# Patient Record
Sex: Female | Born: 1945 | Race: White | Hispanic: No | Marital: Married | Smoking: Former smoker
Health system: Southern US, Community
[De-identification: ages and names within clinical notes are randomized; demographics above are authoritative.]

## PROBLEM LIST (undated history)

## (undated) DIAGNOSIS — E7800 Pure hypercholesterolemia, unspecified: Secondary | ICD-10-CM

## (undated) DIAGNOSIS — K429 Umbilical hernia without obstruction or gangrene: Secondary | ICD-10-CM

## (undated) DIAGNOSIS — Z8679 Personal history of other diseases of the circulatory system: Secondary | ICD-10-CM

## (undated) DIAGNOSIS — E119 Type 2 diabetes mellitus without complications: Secondary | ICD-10-CM

## (undated) DIAGNOSIS — Z9889 Other specified postprocedural states: Secondary | ICD-10-CM

## (undated) DIAGNOSIS — N823 Fistula of vagina to large intestine: Secondary | ICD-10-CM

## (undated) DIAGNOSIS — T4145XA Adverse effect of unspecified anesthetic, initial encounter: Secondary | ICD-10-CM

## (undated) DIAGNOSIS — M1990 Unspecified osteoarthritis, unspecified site: Secondary | ICD-10-CM

## (undated) DIAGNOSIS — L0390 Cellulitis, unspecified: Secondary | ICD-10-CM

## (undated) DIAGNOSIS — I10 Essential (primary) hypertension: Secondary | ICD-10-CM

## (undated) DIAGNOSIS — T8859XA Other complications of anesthesia, initial encounter: Secondary | ICD-10-CM

## (undated) DIAGNOSIS — Z8742 Personal history of other diseases of the female genital tract: Secondary | ICD-10-CM

## (undated) DIAGNOSIS — R011 Cardiac murmur, unspecified: Secondary | ICD-10-CM

## (undated) DIAGNOSIS — Z531 Procedure and treatment not carried out because of patient's decision for reasons of belief and group pressure: Secondary | ICD-10-CM

## (undated) DIAGNOSIS — R112 Nausea with vomiting, unspecified: Secondary | ICD-10-CM

## (undated) DIAGNOSIS — G4730 Sleep apnea, unspecified: Secondary | ICD-10-CM

## (undated) DIAGNOSIS — F419 Anxiety disorder, unspecified: Secondary | ICD-10-CM

## (undated) DIAGNOSIS — IMO0001 Reserved for inherently not codable concepts without codable children: Secondary | ICD-10-CM

## (undated) DIAGNOSIS — K219 Gastro-esophageal reflux disease without esophagitis: Secondary | ICD-10-CM

## (undated) DIAGNOSIS — I499 Cardiac arrhythmia, unspecified: Secondary | ICD-10-CM

## (undated) DIAGNOSIS — R7303 Prediabetes: Secondary | ICD-10-CM

## (undated) HISTORY — PX: JOINT REPLACEMENT: SHX530

## (undated) HISTORY — PX: APPENDECTOMY: SHX54

---

## 1971-11-09 HISTORY — PX: EXPLORATORY LAPAROTOMY: SUR591

## 1976-11-08 HISTORY — PX: MANDIBLE SURGERY: SHX707

## 1978-11-08 HISTORY — PX: OTHER SURGICAL HISTORY: SHX169

## 1999-02-16 ENCOUNTER — Other Ambulatory Visit: Admission: RE | Admit: 1999-02-16 | Discharge: 1999-02-16 | Payer: Self-pay | Admitting: Obstetrics and Gynecology

## 2000-03-29 ENCOUNTER — Other Ambulatory Visit: Admission: RE | Admit: 2000-03-29 | Discharge: 2000-03-29 | Payer: Self-pay | Admitting: *Deleted

## 2001-05-16 ENCOUNTER — Other Ambulatory Visit: Admission: RE | Admit: 2001-05-16 | Discharge: 2001-05-16 | Payer: Self-pay | Admitting: *Deleted

## 2001-05-18 ENCOUNTER — Encounter: Admission: RE | Admit: 2001-05-18 | Discharge: 2001-05-18 | Payer: Self-pay | Admitting: *Deleted

## 2001-05-18 ENCOUNTER — Encounter: Payer: Self-pay | Admitting: *Deleted

## 2001-05-23 ENCOUNTER — Encounter: Payer: Self-pay | Admitting: *Deleted

## 2001-05-23 ENCOUNTER — Encounter: Admission: RE | Admit: 2001-05-23 | Discharge: 2001-05-23 | Payer: Self-pay | Admitting: *Deleted

## 2002-07-11 ENCOUNTER — Encounter: Admission: RE | Admit: 2002-07-11 | Discharge: 2002-07-11 | Payer: Self-pay | Admitting: *Deleted

## 2002-07-11 ENCOUNTER — Encounter: Payer: Self-pay | Admitting: *Deleted

## 2002-07-12 ENCOUNTER — Other Ambulatory Visit: Admission: RE | Admit: 2002-07-12 | Discharge: 2002-07-12 | Payer: Self-pay | Admitting: Obstetrics and Gynecology

## 2005-11-08 DIAGNOSIS — Z8679 Personal history of other diseases of the circulatory system: Secondary | ICD-10-CM

## 2005-11-08 HISTORY — DX: Personal history of other diseases of the circulatory system: Z86.79

## 2006-03-21 ENCOUNTER — Encounter: Admission: RE | Admit: 2006-03-21 | Discharge: 2006-03-21 | Payer: Self-pay | Admitting: Family Medicine

## 2006-03-21 IMAGING — CR DG LUMBAR SPINE 2-3V
3 series · 3 of 3 positions shown · non-contrast
Comparison: none

CLINICAL DATA: Left sciatica.  
 LUMBAR SPINE ? 2 VIEW:

[view not recorded (1 of 3)]
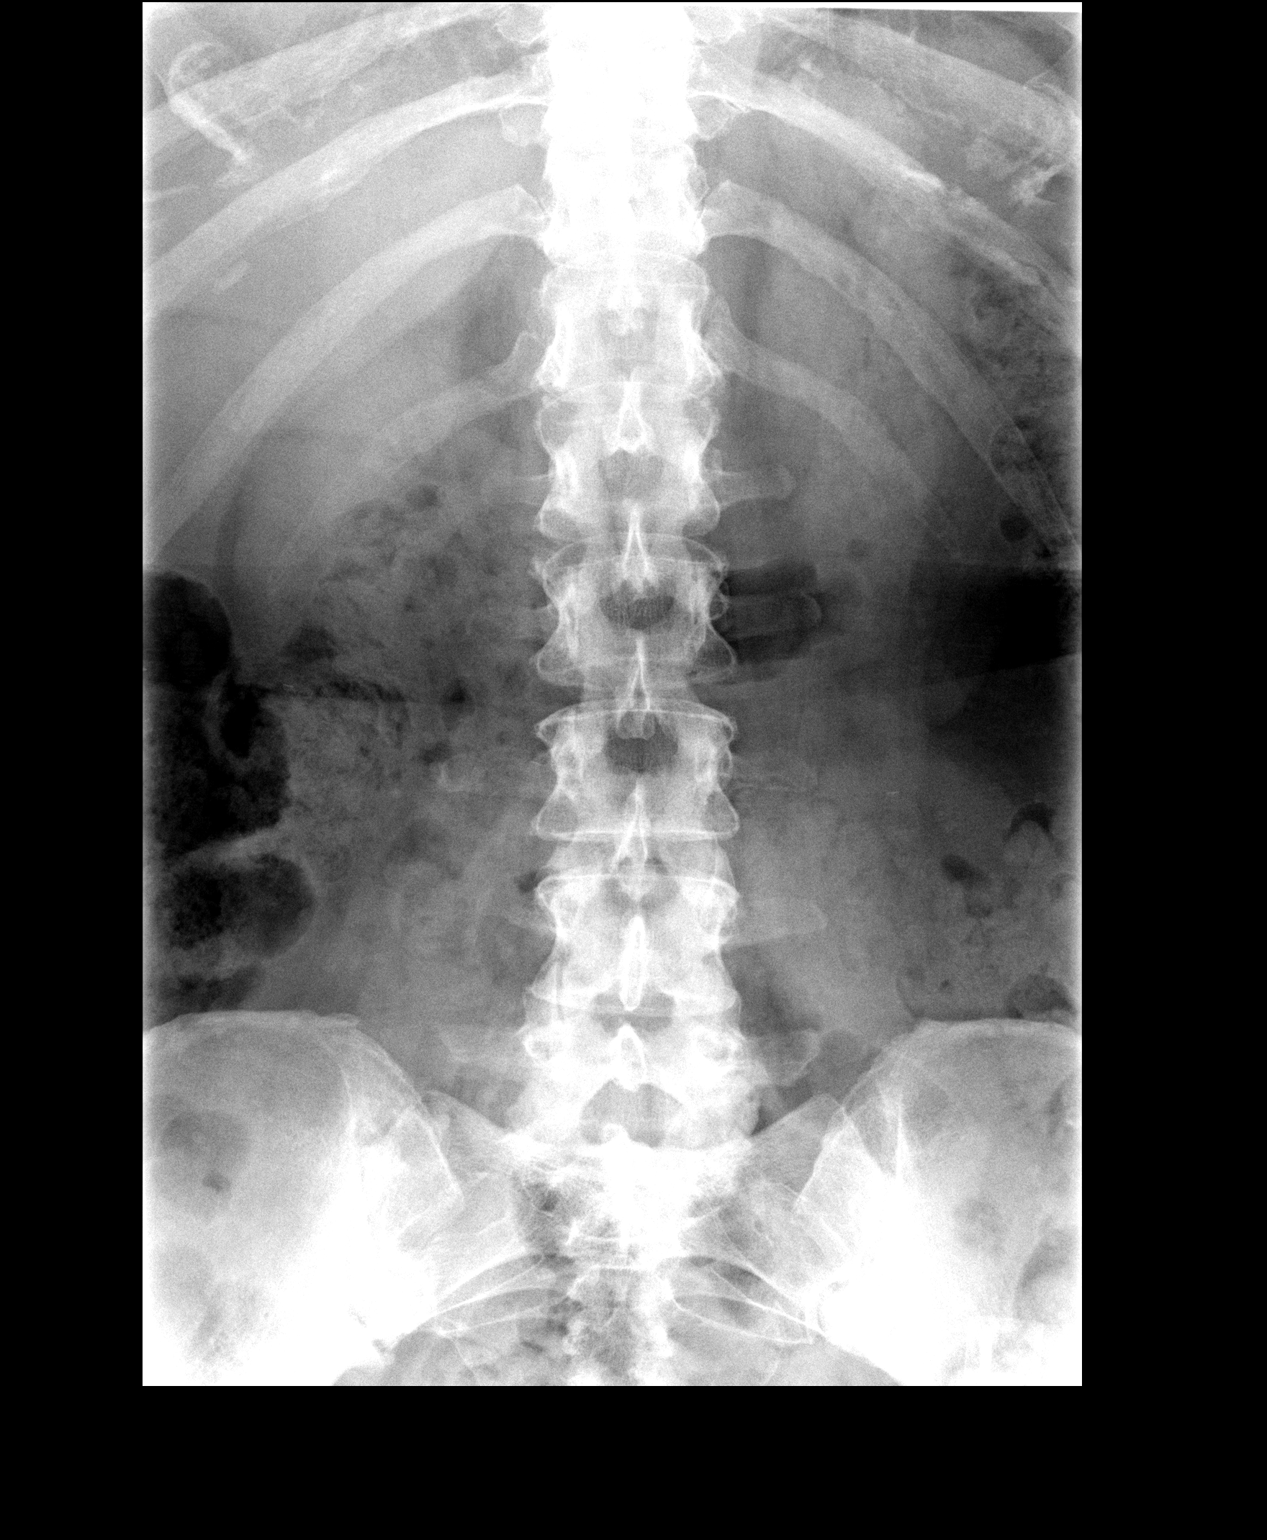

[view not recorded (2 of 3)]
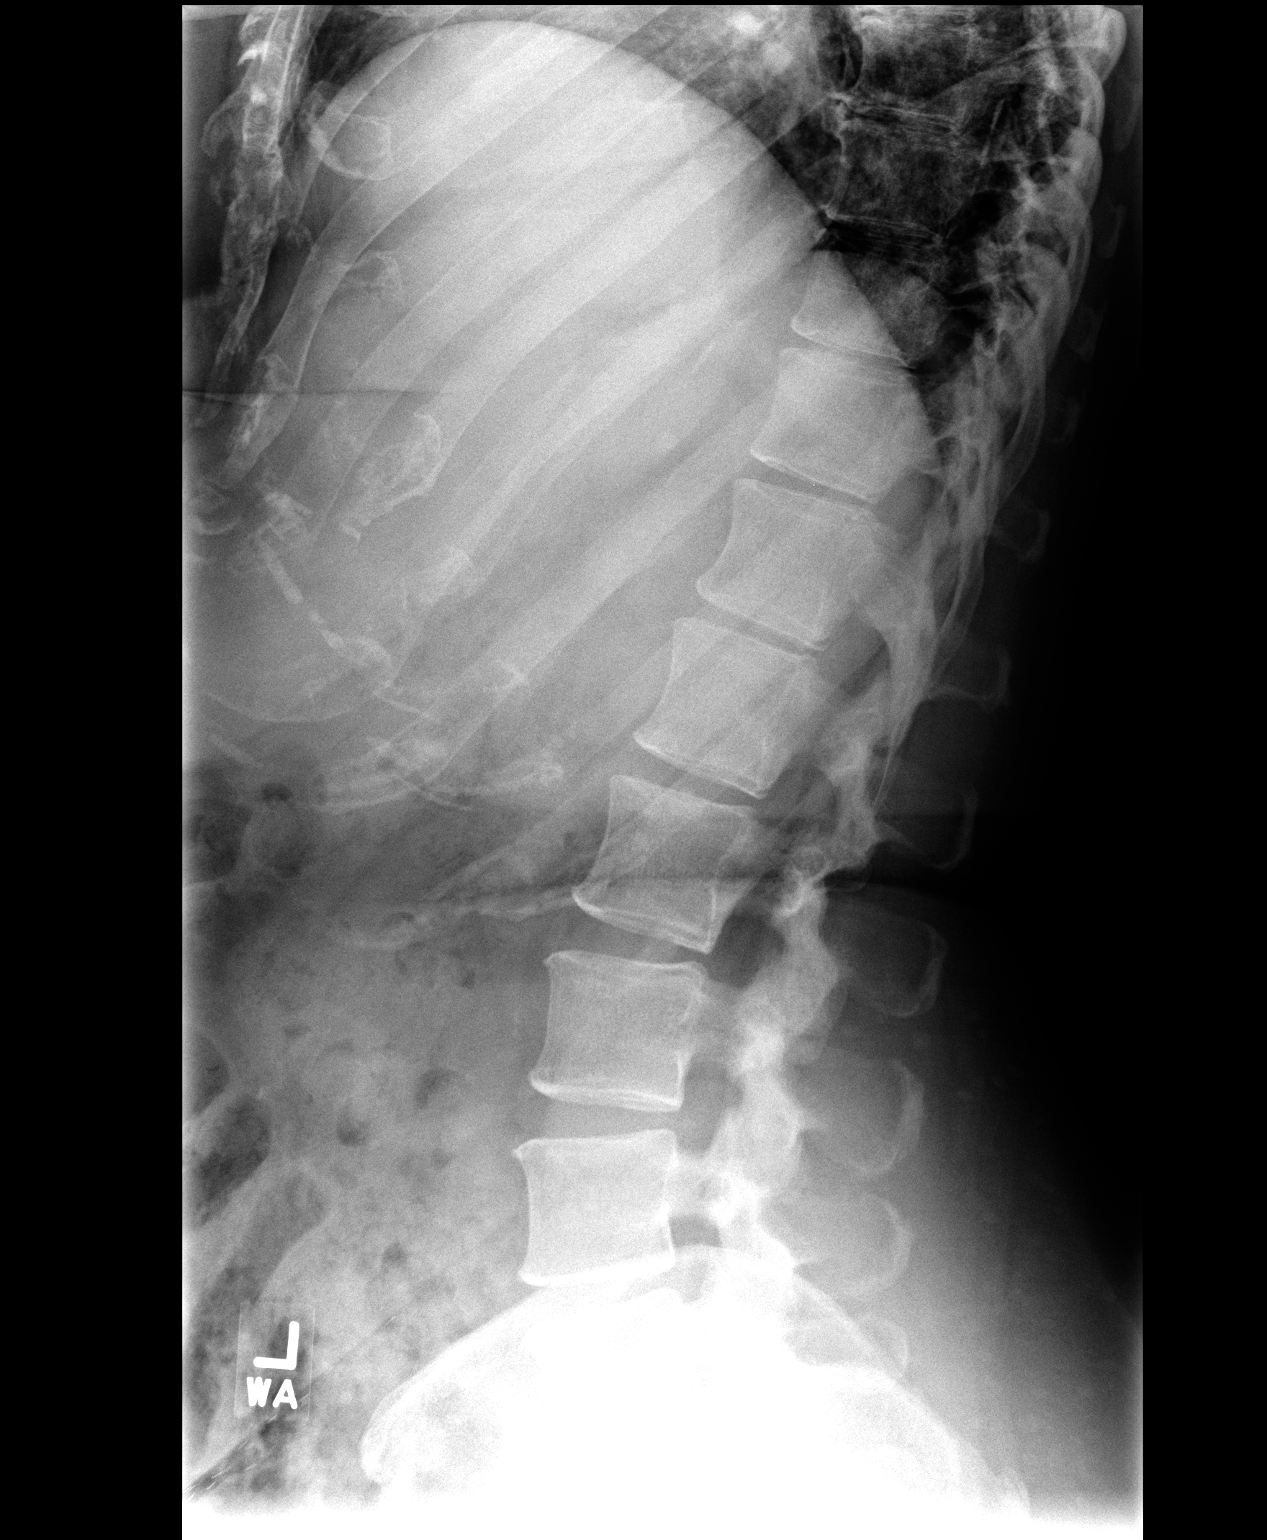

[view not recorded (3 of 3)]
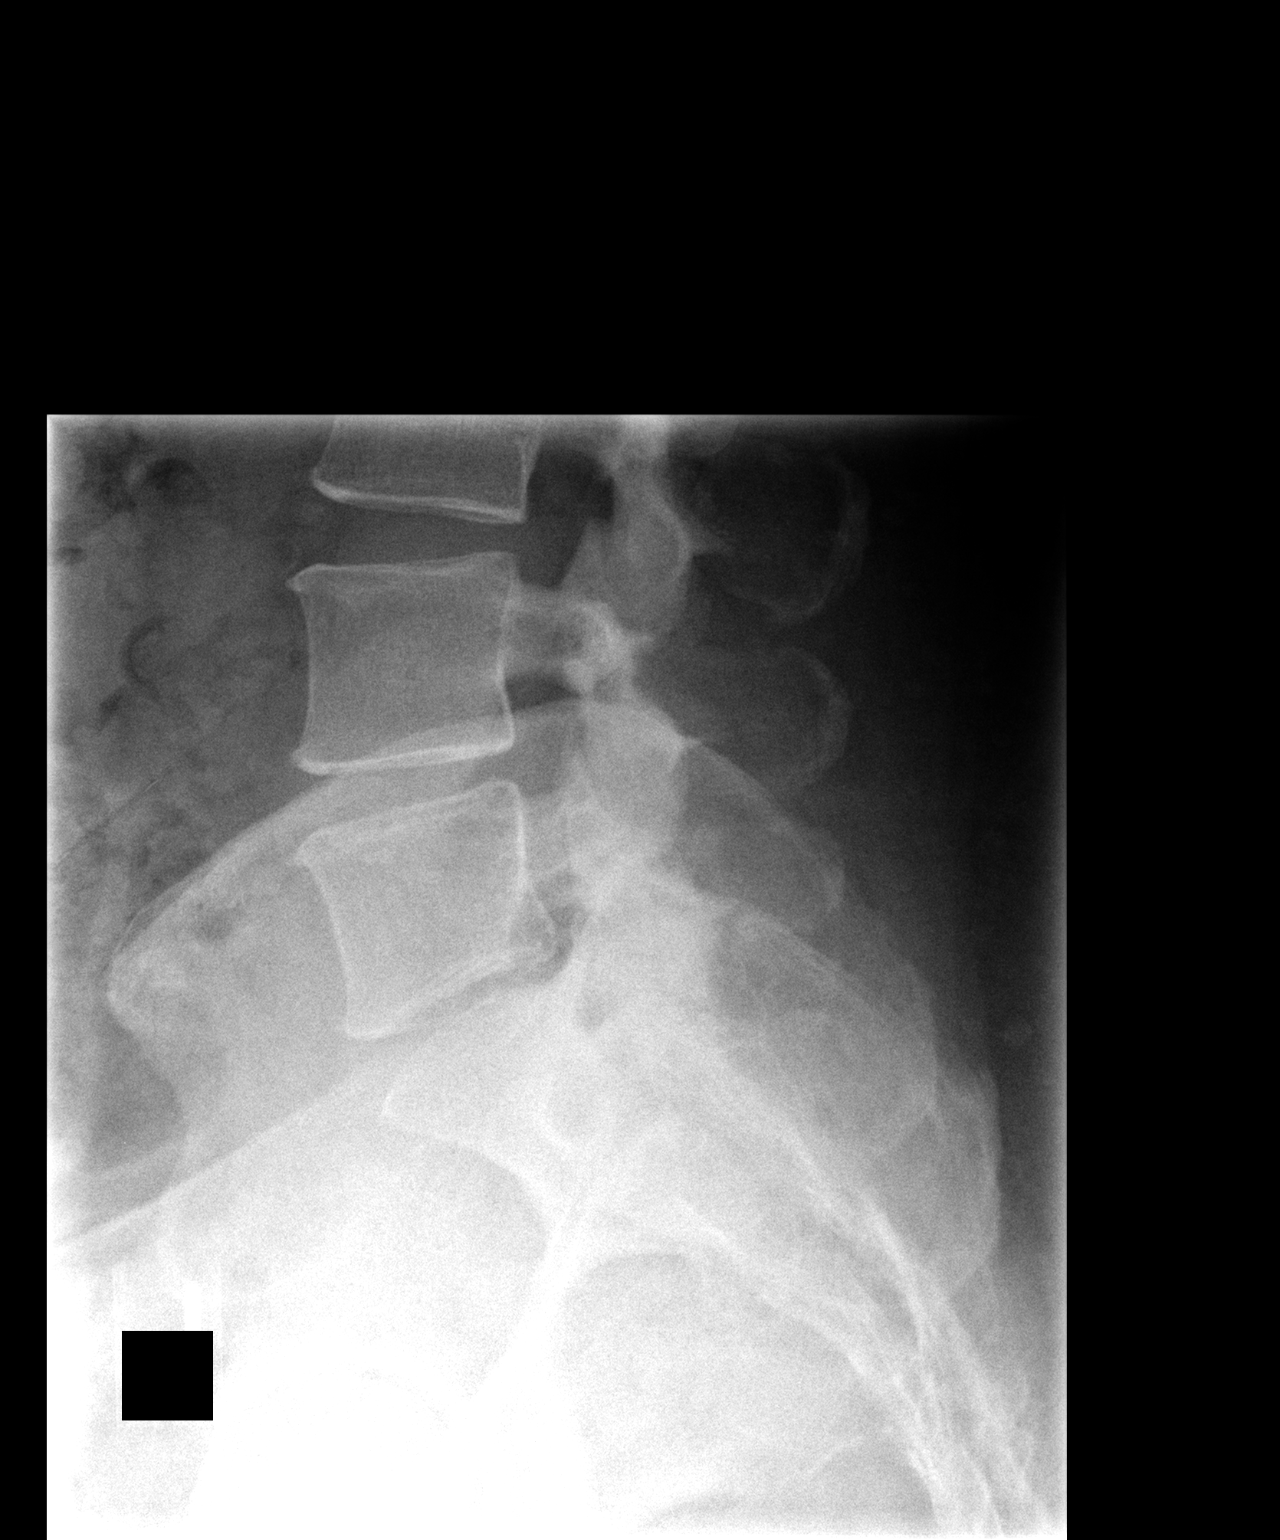

[3 of 3 positions shown; findings below may reference images not displayed]

FINDINGS: There are five non-rib bearing lumbar-type vertebrae.  There is disc narrowing at L4-5 and to a greater degree at L5-S1.  There also some degenerative changes at the SI joint.
IMPRESSION: Degenerative disc disease and degenerative changes of the SI joints.

## 2006-05-12 ENCOUNTER — Ambulatory Visit: Payer: Self-pay | Admitting: Cardiology

## 2006-05-12 ENCOUNTER — Inpatient Hospital Stay (HOSPITAL_COMMUNITY): Admission: EM | Admit: 2006-05-12 | Discharge: 2006-05-13 | Payer: Self-pay | Admitting: Emergency Medicine

## 2006-05-12 IMAGING — CR DG CHEST 1V PORT
1 series · 1 of 1 positions shown · non-contrast
Comparison: none

HISTORY: Chest pain, dyspnea, arrhythmia

PORTABLE CHEST ONE VIEW:
Portable exam [4K] hours without priors for comparison.
Cardiomegaly.
Mild elongation of aorta.
Pulmonary vascularity normal.
Minimal right basilar atelectasis.
Lungs otherwise clear.
No pneumothorax.
Cardiac monitoring lines project over chest.

[view not recorded]
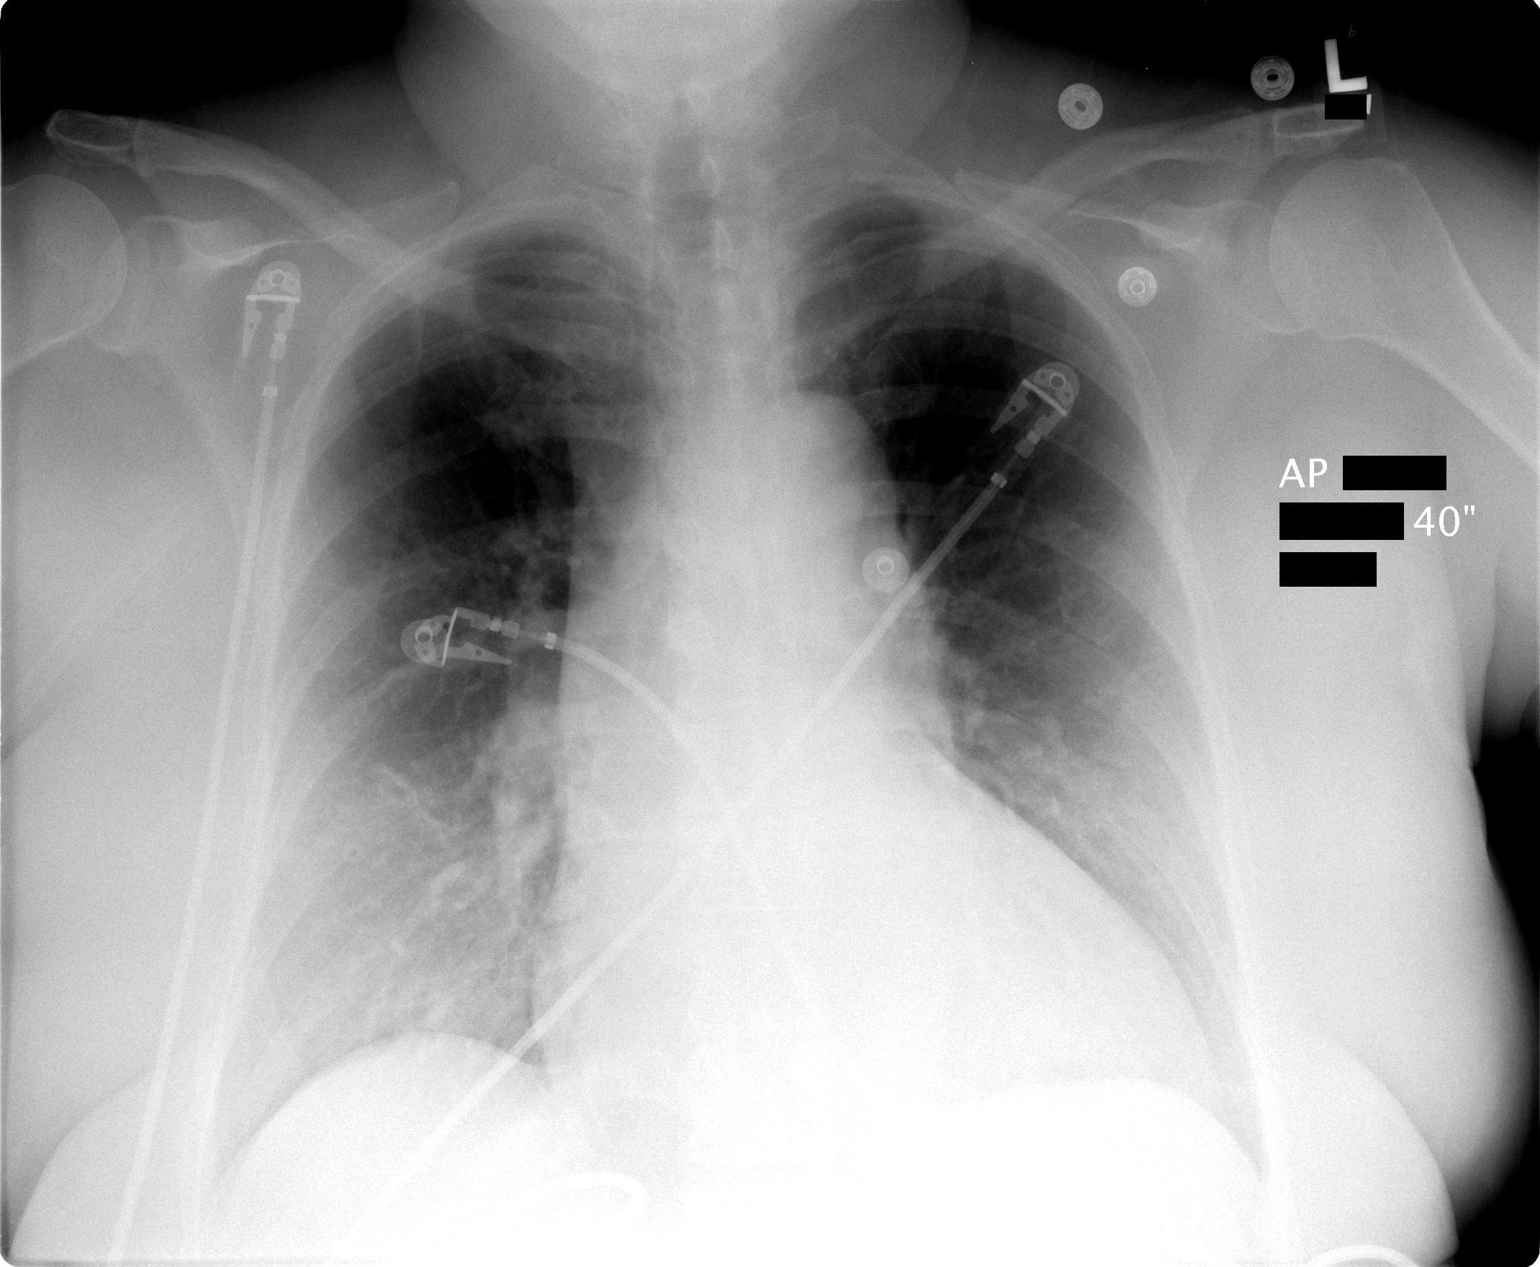

[1 of 1 positions shown; findings below may reference images not displayed]

IMPRESSION: Cardiomegaly with minimal right basilar atelectasis.

## 2006-05-12 IMAGING — CR DG CHEST 2V
2 series · 2 of 2 positions shown · non-contrast
Comparison: none

HISTORY: Chest pain, dyspnea, dizziness

CHEST 2 VIEWS:
Comparison prior portable exam [DATE]
Cardiac enlargement with tortuous thoracic aorta.
Pulmonary vascularity normal.
Mild bronchitic changes without definite infiltrate or effusion.
Right basilar aeration improved.
No acute bone lesions.
No pneumothorax.

[w chest pa]
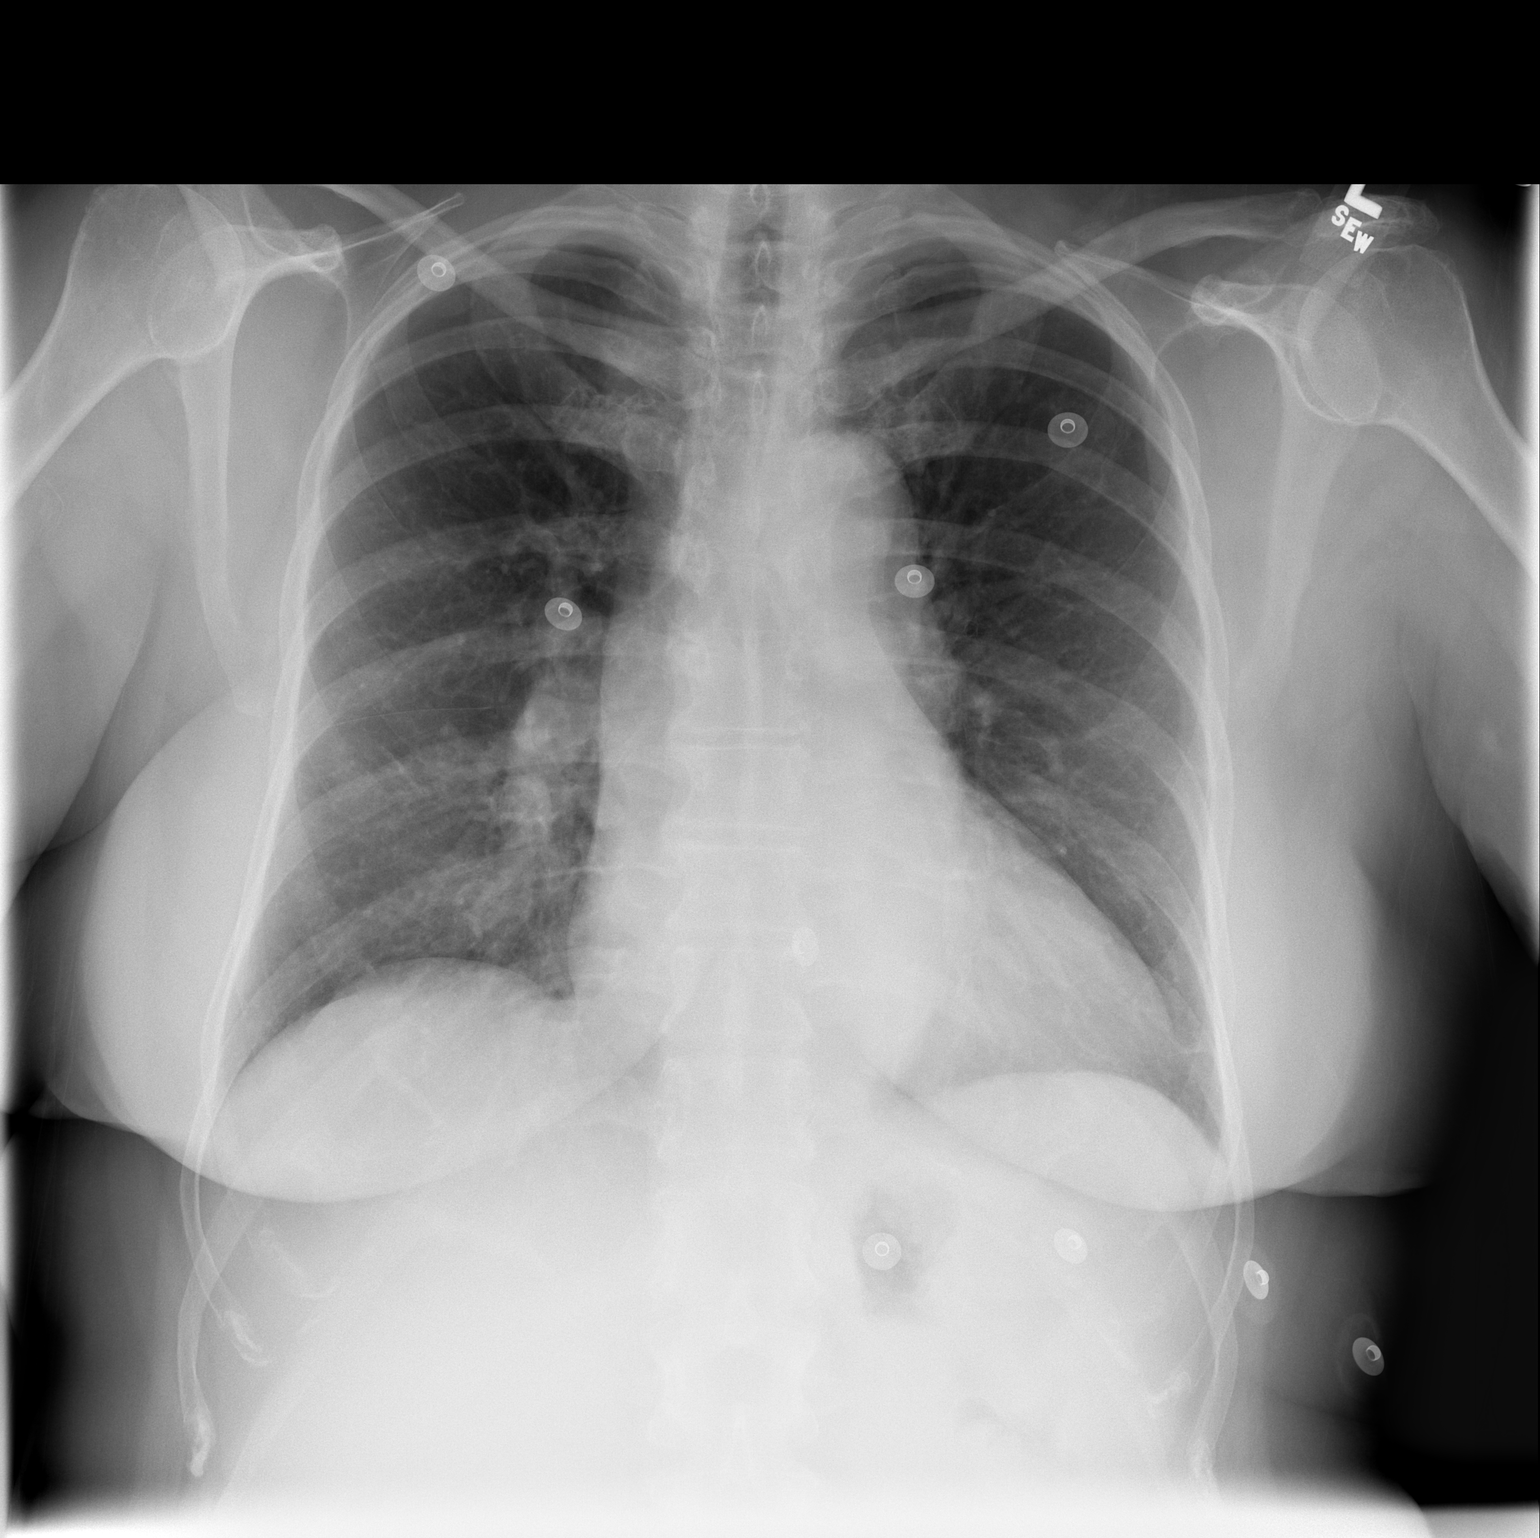

[w chest lat]
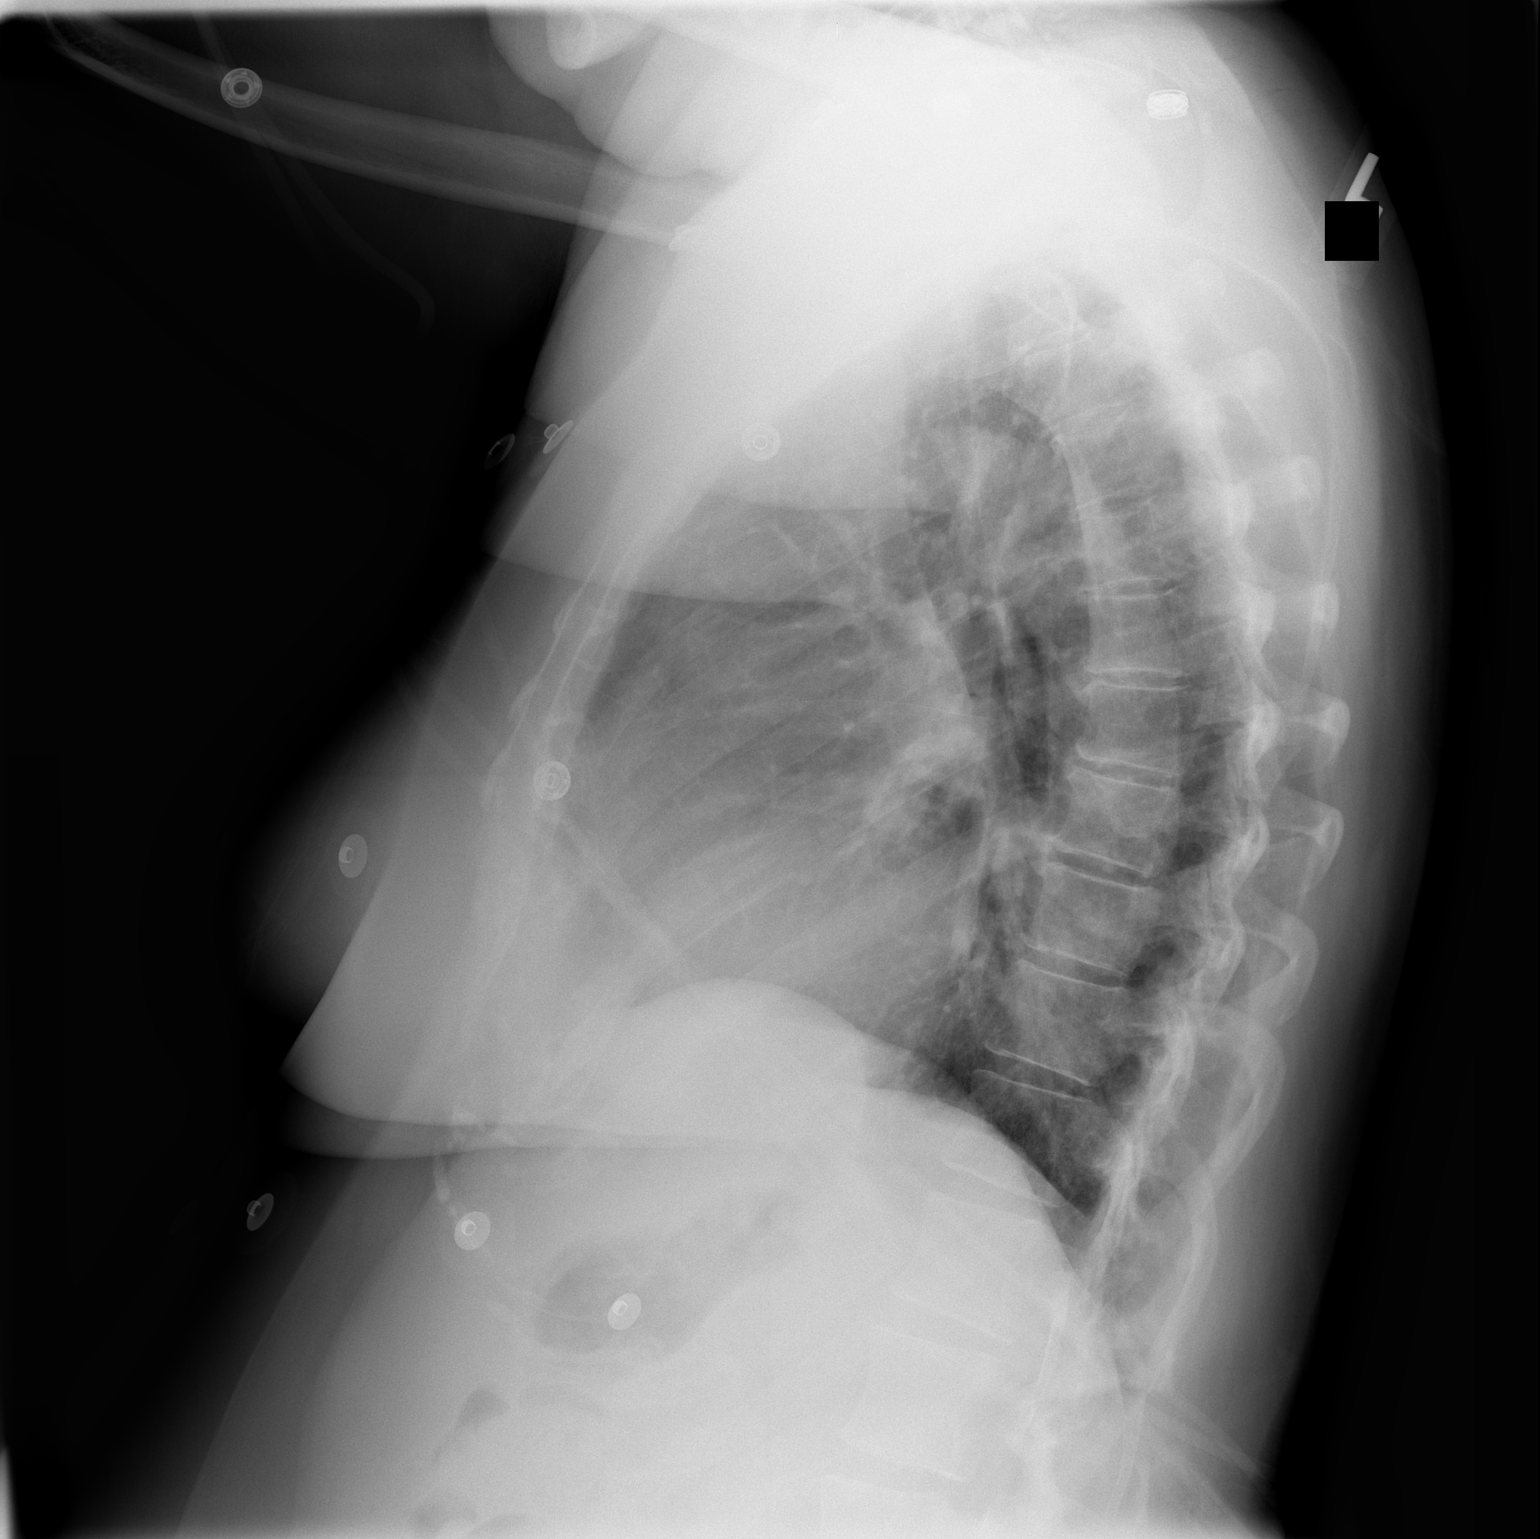

[2 of 2 positions shown; findings below may reference images not displayed]

IMPRESSION: Cardiomegaly with mild bronchitic changes.
No acute abnormalities.

## 2012-04-11 ENCOUNTER — Other Ambulatory Visit: Payer: Self-pay

## 2012-04-11 DIAGNOSIS — R600 Localized edema: Secondary | ICD-10-CM

## 2012-04-27 ENCOUNTER — Other Ambulatory Visit: Payer: Self-pay | Admitting: Orthopedic Surgery

## 2012-05-01 ENCOUNTER — Encounter (HOSPITAL_COMMUNITY): Payer: Self-pay | Admitting: Pharmacy Technician

## 2012-05-03 ENCOUNTER — Encounter (HOSPITAL_COMMUNITY)
Admission: RE | Admit: 2012-05-03 | Discharge: 2012-05-03 | Disposition: A | Discharge status: Home / Self Care | Payer: Medicare Other | Source: Ambulatory Visit | Attending: Orthopedic Surgery | Admitting: Orthopedic Surgery

## 2012-05-03 ENCOUNTER — Ambulatory Visit (HOSPITAL_COMMUNITY)
Admission: RE | Admit: 2012-05-03 | Discharge: 2012-05-03 | Disposition: A | Discharge status: Home / Self Care | Payer: Medicare Other | Source: Ambulatory Visit | Attending: Orthopedic Surgery | Admitting: Orthopedic Surgery

## 2012-05-03 ENCOUNTER — Encounter (HOSPITAL_COMMUNITY): Payer: Self-pay

## 2012-05-03 DIAGNOSIS — Z01818 Encounter for other preprocedural examination: Secondary | ICD-10-CM | POA: Insufficient documentation

## 2012-05-03 DIAGNOSIS — Z01810 Encounter for preprocedural cardiovascular examination: Secondary | ICD-10-CM | POA: Insufficient documentation

## 2012-05-03 DIAGNOSIS — Z01812 Encounter for preprocedural laboratory examination: Secondary | ICD-10-CM | POA: Insufficient documentation

## 2012-05-03 DIAGNOSIS — I517 Cardiomegaly: Secondary | ICD-10-CM | POA: Insufficient documentation

## 2012-05-03 HISTORY — DX: Essential (primary) hypertension: I10

## 2012-05-03 HISTORY — DX: Pure hypercholesterolemia, unspecified: E78.00

## 2012-05-03 HISTORY — DX: Unspecified osteoarthritis, unspecified site: M19.90

## 2012-05-03 HISTORY — DX: Personal history of other diseases of the female genital tract: Z87.42

## 2012-05-03 HISTORY — DX: Gastro-esophageal reflux disease without esophagitis: K21.9

## 2012-05-03 HISTORY — DX: Personal history of other diseases of the circulatory system: Z86.79

## 2012-05-03 LAB — DIFFERENTIAL
Eosinophils Absolute: 0.1 10*3/uL (ref 0.0–0.7)
Eosinophils Relative: 2 % (ref 0–5)
Lymphs Abs: 1.9 10*3/uL (ref 0.7–4.0)
Monocytes Relative: 8 % (ref 3–12)

## 2012-05-03 LAB — URINALYSIS, ROUTINE W REFLEX MICROSCOPIC
Hgb urine dipstick: NEGATIVE
Specific Gravity, Urine: 1.025 (ref 1.005–1.030)
pH: 6 (ref 5.0–8.0)

## 2012-05-03 LAB — BASIC METABOLIC PANEL
BUN: 15 mg/dL (ref 6–23)
CO2: 22 mEq/L (ref 19–32)
Calcium: 9.5 mg/dL (ref 8.4–10.5)
Glucose, Bld: 104 mg/dL — ABNORMAL HIGH (ref 70–99)
Sodium: 138 mEq/L (ref 135–145)

## 2012-05-03 LAB — URINE MICROSCOPIC-ADD ON

## 2012-05-03 LAB — SURGICAL PCR SCREEN: Staphylococcus aureus: NEGATIVE

## 2012-05-03 LAB — CBC
MCH: 31.9 pg (ref 26.0–34.0)
MCV: 93 fL (ref 78.0–100.0)
Platelets: 248 10*3/uL (ref 150–400)
RBC: 4.26 MIL/uL (ref 3.87–5.11)

## 2012-05-03 LAB — NO BLOOD PRODUCTS

## 2012-05-03 LAB — PROTIME-INR: Prothrombin Time: 14.4 seconds (ref 11.6–15.2)

## 2012-05-03 IMAGING — CR DG CHEST 2V
2 series · 2 of 2 positions shown · non-contrast
Comparison: [DATE]

CLINICAL DATA: Preop for left hip replacement

CHEST - 2 VIEW

[view not recorded (1 of 2)]
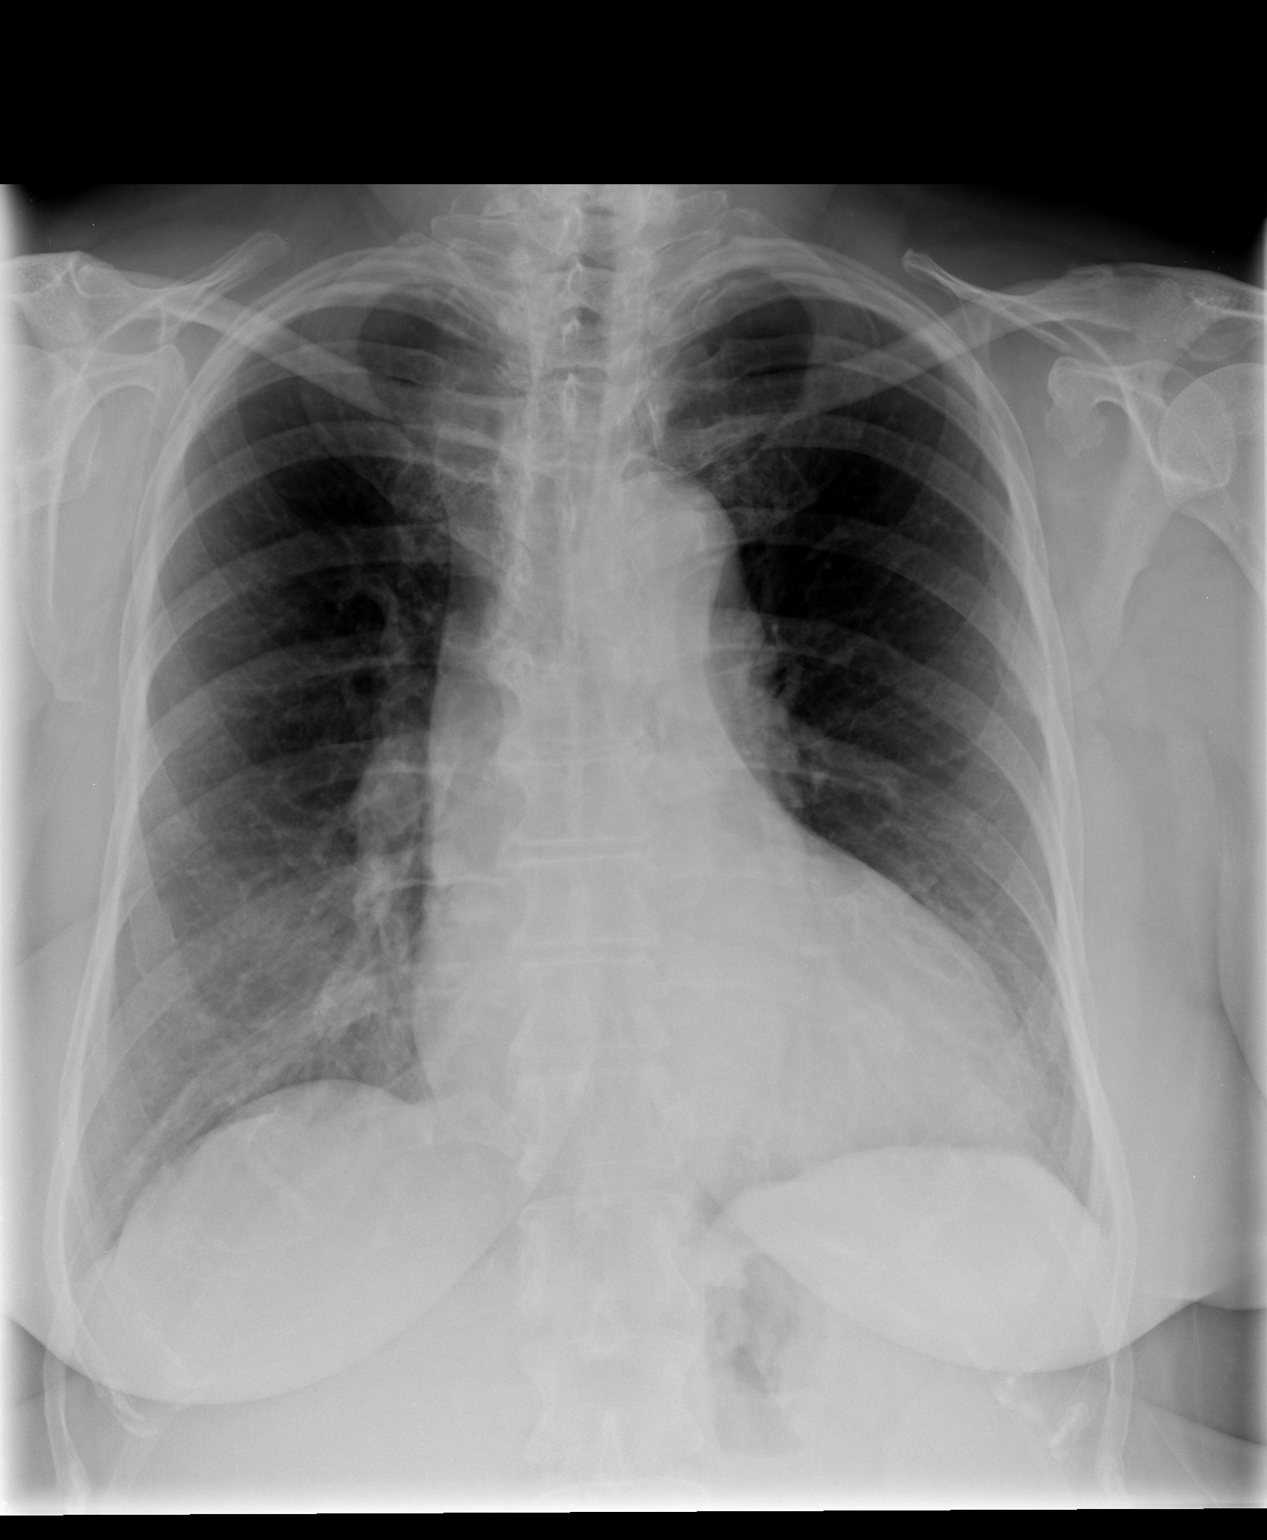

[view not recorded (2 of 2)]
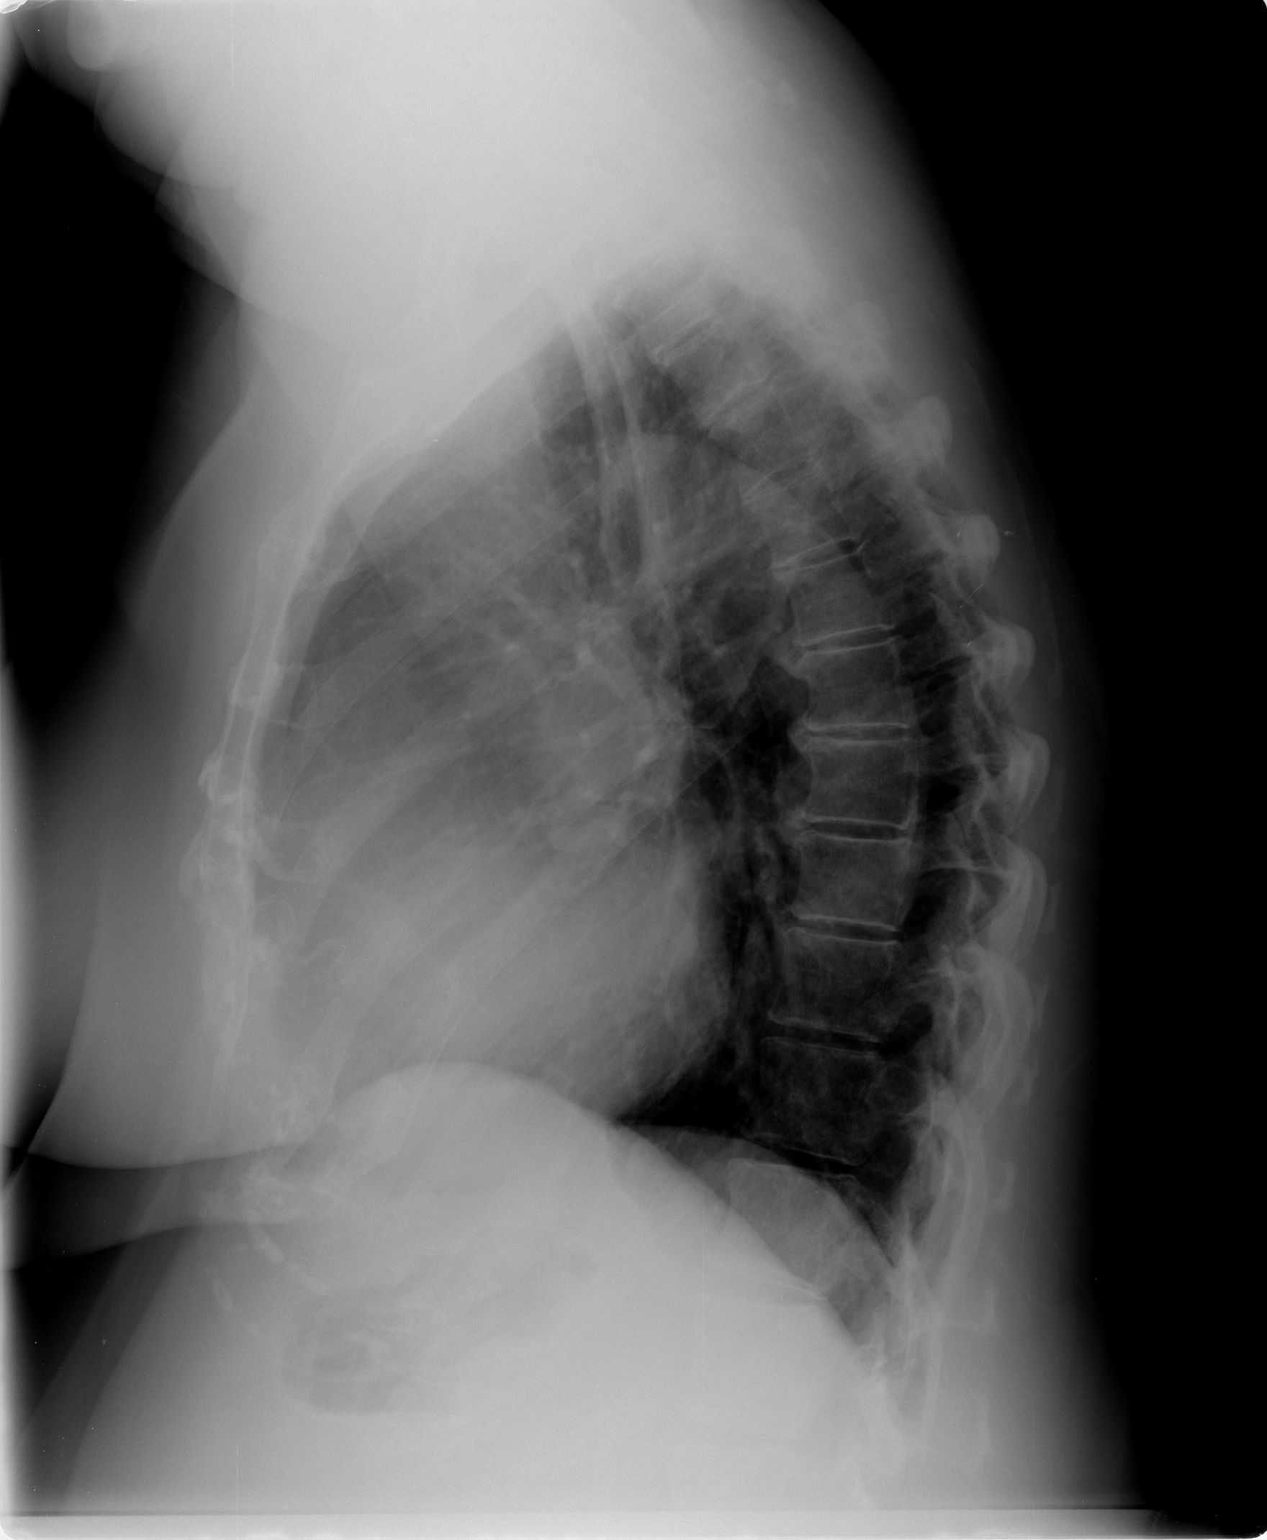

[2 of 2 positions shown; findings below may reference images not displayed]

FINDINGS: Cardiomegaly again noted.  No acute infiltrate or
pleural effusion.  No pulmonary edema.  Stable mild degenerative
changes thoracic spine.
IMPRESSION: No active disease.  No significant change.

## 2012-05-03 NOTE — Progress Notes (Signed)
Blood product refusal faxed patient states that Dr. Turner Daniels is aware of wishes regarding blood products

## 2012-05-03 NOTE — Pre-Procedure Instructions (Addendum)
[address] KAMEREN BAADE  05/03/2012   Your procedure is scheduled on:  July 1  Report to [facility] at 07:50 AM.  Call this number if you have problems the morning of surgery: [phone]   Remember:   Nothing to eat or drink:After Midnight.  Take these medicines the morning of surgery with A SIP OF WATER: Prilosec, Tramadol, Flonase, Mucinex       STOP Advil PM today may use plain benadryl (dipenhydramine) for sleep instead if needed   Do not wear jewelry, make-up or nail polish.  Do not wear lotions, powders, or perfumes. You may wear deodorant.  Do not shave 48 hours prior to surgery. Men may shave face and neck.  Do not bring valuables to the hospital.  Contacts, dentures or bridgework may not be worn into surgery.  Leave suitcase in the car. After surgery it may be brought to your room.  For patients admitted to the hospital, checkout time is 11:00 AM the day of discharge.   Special Instructions: Incentive Spirometry - Practice and bring it with you on the day of surgery. and CHG Shower Use Special Wash: 1/2 bottle night before surgery and 1/2 bottle morning of surgery.   Please read over the following fact sheets that you were given: Pain Booklet, Coughing and Deep Breathing, Blood Transfusion Information, Lab Information, Total Joint Packet and Surgical Site Infection Prevention

## 2012-05-04 NOTE — Consult Note (Signed)
Anesthesia Chart Review:  Patient is a 66 year old female scheduled for left THA on 05/08/12 by Dr. Turner Daniels.  History includes obesity with BMI 39, former smoker, GERD, HTN, hypercholesterolemia, arthritis, mandibular surgery.  History also lists paroxsymal afib in July 2007 when taking Flagyl and doxycycline.  Patient is convinced that her afib was caused by Flagyl.  During that hospitalization he ruled out for MI (see below for echo results). PCP is Dr. Mila Palmer with [facility].  EKG on 05/02/12 showed NSR, incomplete right BBB, minimal voltage criteria for LVH, non-specific T wave abnormality (somewhat diffuse, but most pronounced in inferior leads).  I think it appears stable since 05/13/06.  Echo on 05/12/06 showed normal LV systolic function, EF 55-60%, study was inadequate to evaluate LV wall motion, grade 2 diastolic dysfunction, lower normal AV leaflet excursion, mild ascending aortic dilatation, LA mildly dilated, trivial TR, minimal pericardial effusion.  (Results found in E-chart.)  I spoke with patient.  She denies CP, palpitations, and SOB, however, she has not been able to be very active for approximately one year.  She does do some cleaning around the house, but has to use a motorized W/C when shopping.  She has mild, intermittent LE edema controlled with PRN Lasix.  She has never seen a Cardiologist or had a stress test.  There was no active disease on CXR from 05/03/12.  Labs noted.  H/H 13.6/39.6.  She has refused all blood products except albumin or albumin containing products.  I reviewed Cardiac history and EKGs with Anesthesiologist Dr. Chaney Malling.  Agrees that since patient has been asymptomatic, no further afib, and stable EKG since 2007 then plan to proceed if no significant change in her status.    Shonna Chock, PA-C

## 2012-05-05 NOTE — H&P (Signed)
  Subjective: Kathleen Wiley is a 66 year old patient sent over by Dr. Thurston Hole for evaluation of her left hip.  She has end-stage arthritis by x-ray.  She has tried a cane for ambulation and tramadol for pain control.  Her pain has been refractory to these measures and now wakes her up at night and makes ambulation difficult.  She is interested in having her hip replaced and Dr. Thurston Hole referred her here for surgery.  Past medical history: Hypertension, obesity, and high cholesterol  She is allergic to Septra and metronidazole  Family history: Hypertension and arthritis.  Social history: Denies use of alcohol or tobacco.  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  Review of systems: Contacts, anxiety and, easy bruising.  Otherwise negative aside from the chief complaint.  Objective: Well-developed, well-nourished 66 year old female who is alert and oriented and in no acute distress.  She is 5 feet 4 inches tall and weighs 238 pounds.  BMI is 41.  Examination of the left hip demonstrates significant pain with attempts at internal rotation.  Foot tap is negative.  X-rays: 2 views of the left hip taken previously demonstrate end-stage arthritis  Assess: End-stage arthritis of the left hip  Plan: We have discussed the risks and benefits of hip replacement in detail with Kathleen Wiley today.  We will use a Cell Saver during surgery because she is a Jehovah's Witness and does not accept blood products.  We will go ahead and check her hemoglobin now to see if she needs any erythropoietin prior to surgery.

## 2012-05-07 MED ORDER — CEFAZOLIN SODIUM-DEXTROSE 2-3 GM-% IV SOLR
2.0000 g | INTRAVENOUS | Status: AC
  Administered 2012-05-08: 2 g via INTRAVENOUS

## 2012-05-08 ENCOUNTER — Inpatient Hospital Stay (HOSPITAL_COMMUNITY)
Admission: RE | Admit: 2012-05-08 | Discharge: 2012-05-11 | DRG: 470 | Disposition: A | Discharge status: Home / Self Care | Payer: Medicare Other | Source: Ambulatory Visit | Attending: Orthopedic Surgery | Admitting: Orthopedic Surgery

## 2012-05-08 ENCOUNTER — Encounter (HOSPITAL_COMMUNITY): Payer: Self-pay | Admitting: Surgery

## 2012-05-08 ENCOUNTER — Encounter (HOSPITAL_COMMUNITY)
Admission: RE | Disposition: A | Discharge status: Home / Self Care | Payer: Self-pay | Source: Ambulatory Visit | Attending: Orthopedic Surgery

## 2012-05-08 ENCOUNTER — Encounter (HOSPITAL_COMMUNITY): Payer: Self-pay | Admitting: Vascular Surgery

## 2012-05-08 ENCOUNTER — Inpatient Hospital Stay (HOSPITAL_COMMUNITY): Payer: Medicare Other

## 2012-05-08 ENCOUNTER — Ambulatory Visit (HOSPITAL_COMMUNITY): Payer: Medicare Other | Admitting: Vascular Surgery

## 2012-05-08 DIAGNOSIS — E669 Obesity, unspecified: Secondary | ICD-10-CM | POA: Diagnosis present

## 2012-05-08 DIAGNOSIS — M169 Osteoarthritis of hip, unspecified: Principal | ICD-10-CM | POA: Diagnosis present

## 2012-05-08 DIAGNOSIS — I10 Essential (primary) hypertension: Secondary | ICD-10-CM | POA: Diagnosis present

## 2012-05-08 DIAGNOSIS — M1612 Unilateral primary osteoarthritis, left hip: Secondary | ICD-10-CM | POA: Diagnosis present

## 2012-05-08 DIAGNOSIS — M1610 Unilateral primary osteoarthritis, unspecified hip: Principal | ICD-10-CM | POA: Diagnosis present

## 2012-05-08 HISTORY — PX: TOTAL HIP ARTHROPLASTY: SHX124

## 2012-05-08 IMAGING — CR DG HIP 1V PORT*L*
1 series · 1 of 1 positions shown · non-contrast
Comparison: None.

CLINICAL DATA: Left total hip arthroplasty.

PORTABLE LEFT HIP - 1 VIEW

[lateral]
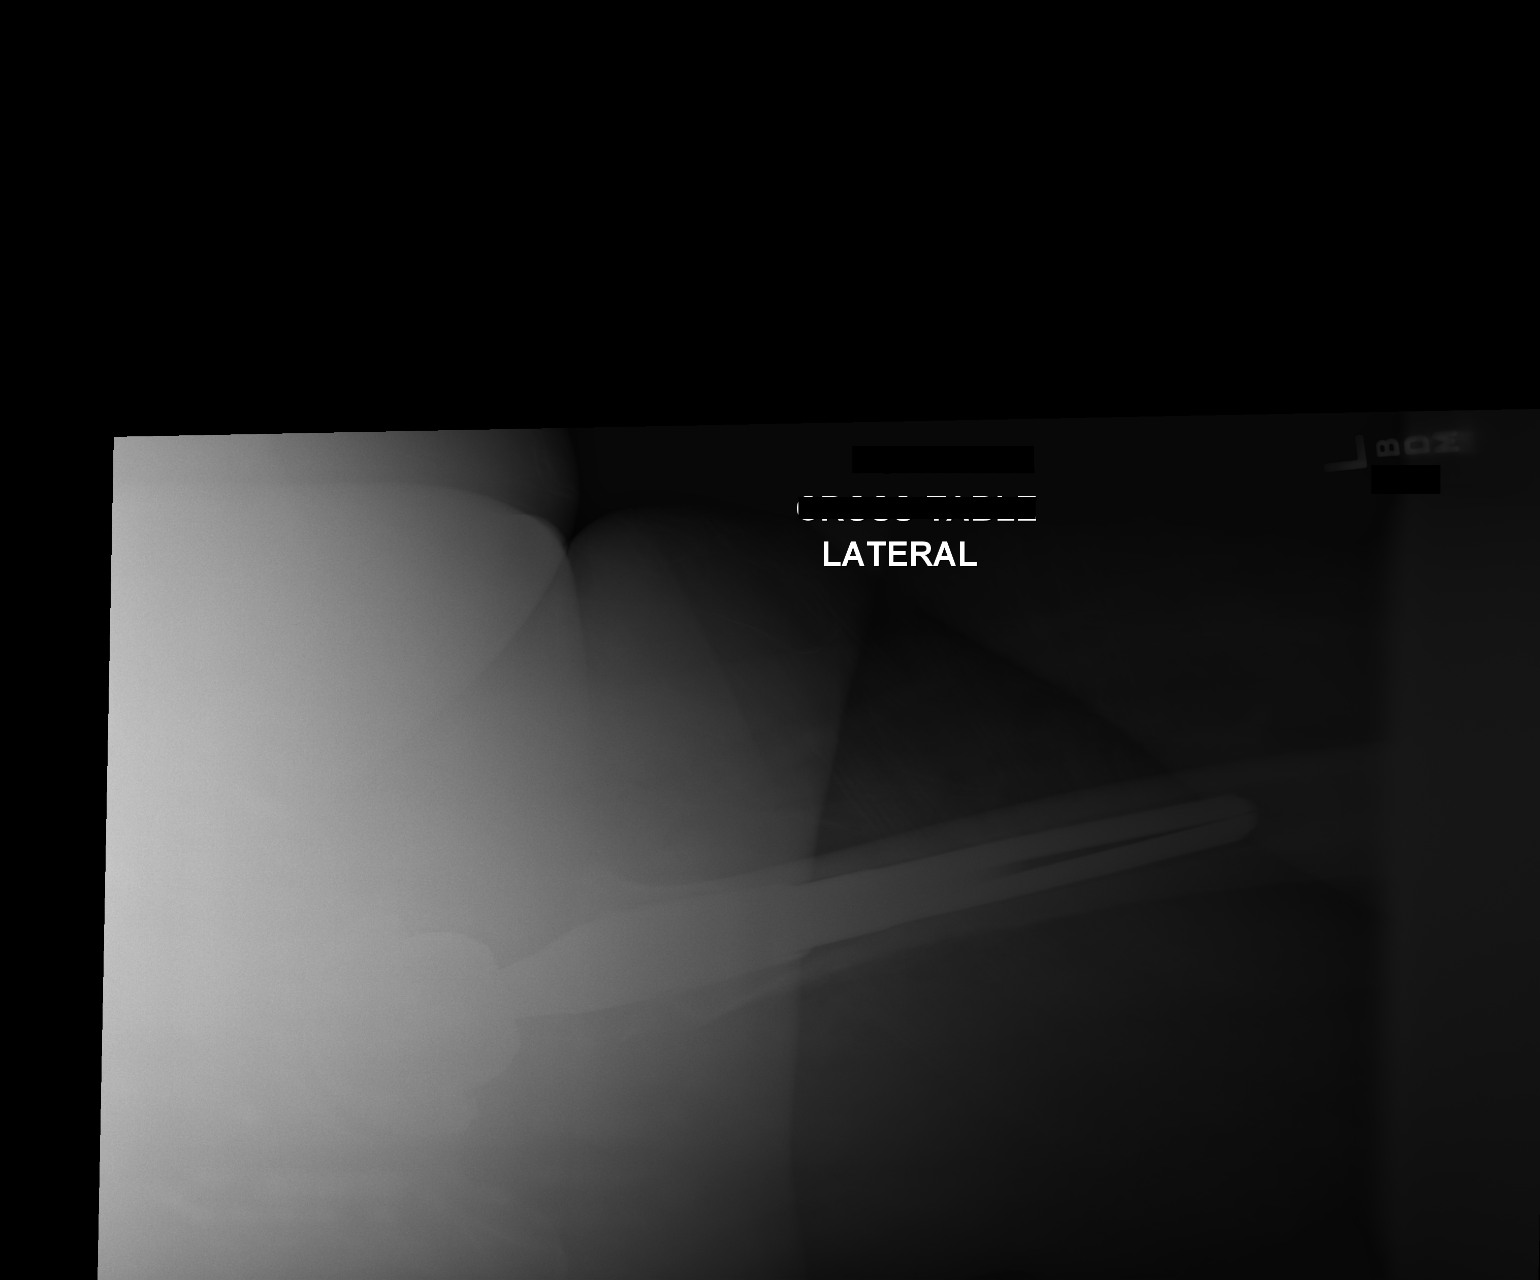

[1 of 1 positions shown; findings below may reference images not displayed]

FINDINGS: Cross-table lateral view of the left hip arthroplasty is
submitted.  Femoral stem appears well seated.  Body habitus
otherwise obscures the left hip.
IMPRESSION: Left hip arthroplasty, as above.

## 2012-05-08 IMAGING — CR DG PORTABLE PELVIS
1 series · 1 of 1 positions shown · non-contrast
Comparison: None.

CLINICAL DATA: Postop left total hip arthroplasty.

PORTABLE PELVIS

[AP]
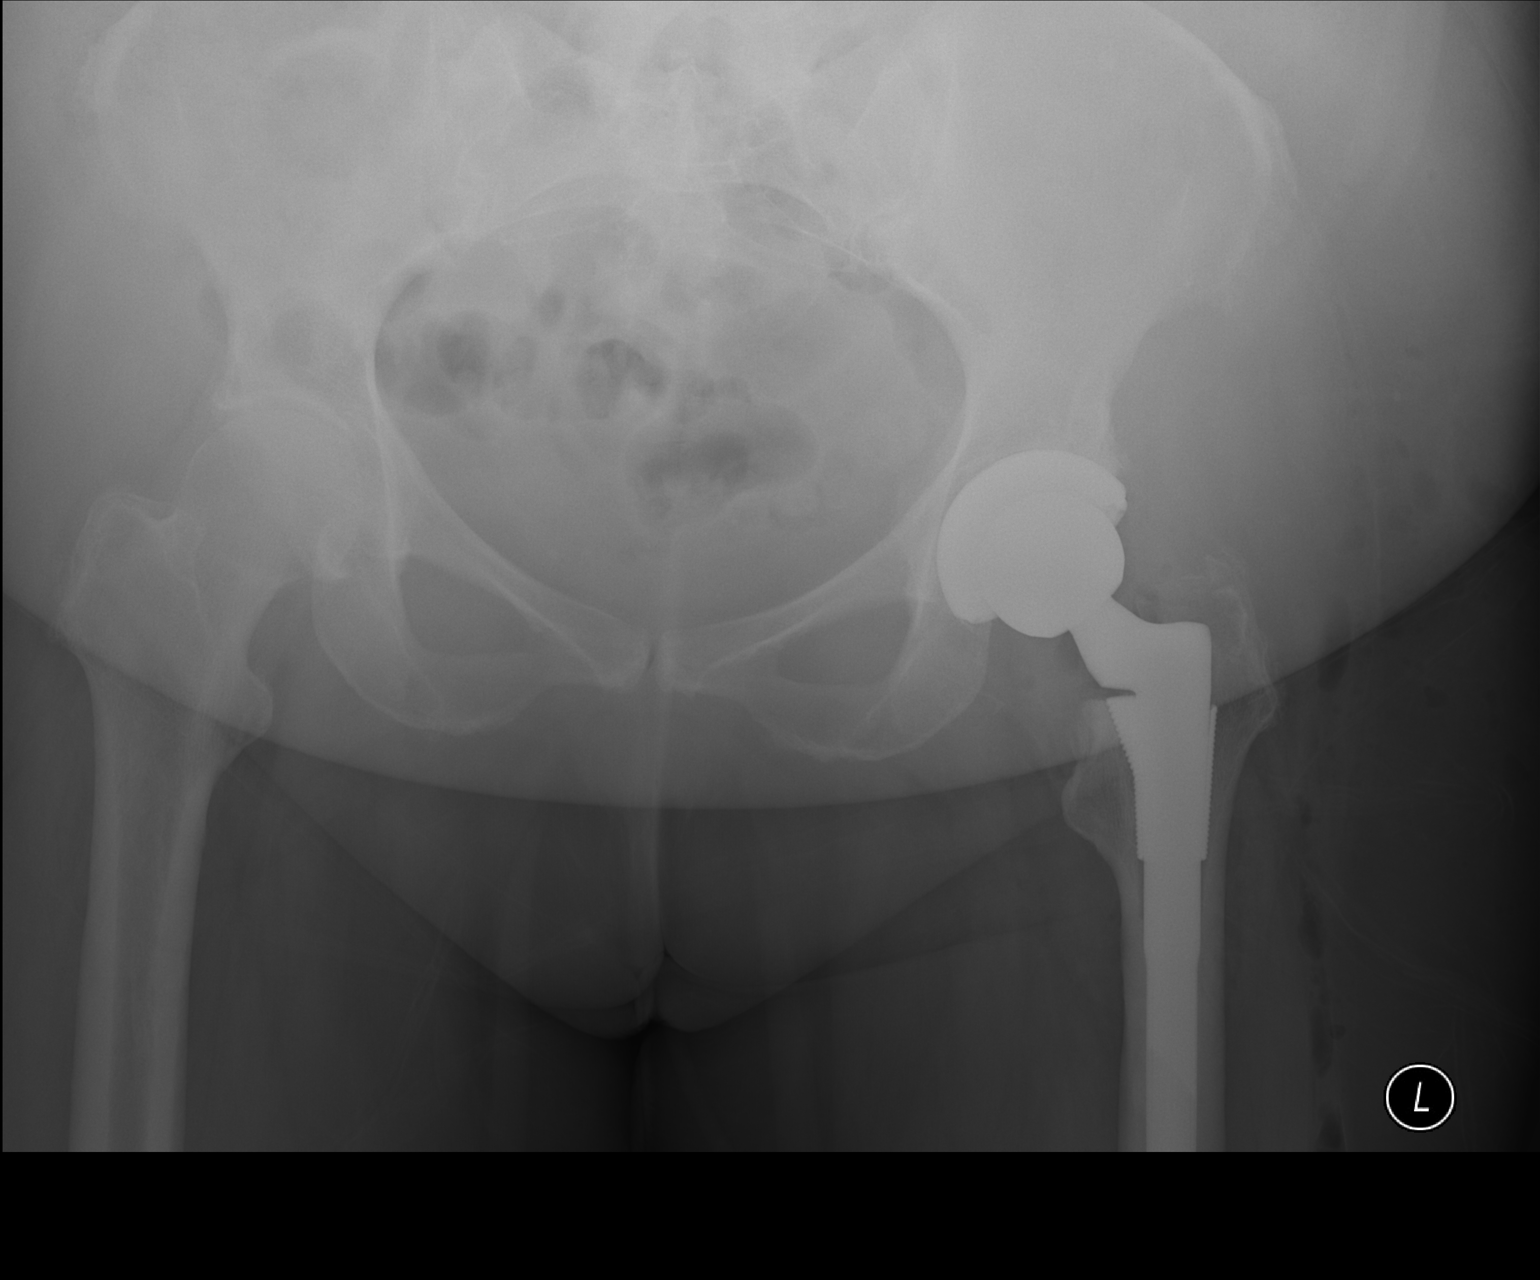

[1 of 1 positions shown; findings below may reference images not displayed]

FINDINGS: Left total hip arthroplasty.  Components appear well
seated.  Subcutaneous air is seen in the surrounding soft tissues.
Mild degenerative changes in the right hip.
IMPRESSION: Left hip arthroplasty with expected postoperative findings.

## 2012-05-08 SURGERY — ARTHROPLASTY, HIP, TOTAL,POSTERIOR APPROACH
Anesthesia: General | Site: Hip | Laterality: Left | Wound class: Clean

## 2012-05-08 MED ORDER — BUPIVACAINE-EPINEPHRINE 0.5% -1:200000 IJ SOLN
INTRAMUSCULAR | Status: DC | PRN
  Administered 2012-05-08: 20 mL

## 2012-05-08 MED ORDER — ZOLPIDEM TARTRATE 5 MG PO TABS: 5.0000 mg | ORAL_TABLET | Freq: Every evening | ORAL | Status: DC | PRN

## 2012-05-08 MED ORDER — HYDROMORPHONE HCL PF 1 MG/ML IJ SOLN
0.5000 mg | INTRAMUSCULAR | Status: DC | PRN
  Administered 2012-05-08 – 2012-05-10 (×3): 1 mg via INTRAVENOUS
  Filled 2012-05-08 (×3): qty 1

## 2012-05-08 MED ORDER — KCL IN DEXTROSE-NACL 20-5-0.45 MEQ/L-%-% IV SOLN
INTRAVENOUS | Status: DC
  Administered 2012-05-08 – 2012-05-09 (×3): via INTRAVENOUS
  Filled 2012-05-08 (×12): qty 1000

## 2012-05-08 MED ORDER — ASPIRIN EC 325 MG PO TBEC
325.0000 mg | DELAYED_RELEASE_TABLET | Freq: Two times a day (BID) | ORAL | Status: DC
  Administered 2012-05-08 – 2012-05-11 (×6): 325 mg via ORAL
  Filled 2012-05-08 (×7): qty 1

## 2012-05-08 MED ORDER — HYDROMORPHONE HCL PF 1 MG/ML IJ SOLN
INTRAMUSCULAR | Status: AC
  Filled 2012-05-08: qty 1

## 2012-05-08 MED ORDER — METHOCARBAMOL 100 MG/ML IJ SOLN
500.0000 mg | Freq: Four times a day (QID) | INTRAVENOUS | Status: DC | PRN
  Filled 2012-05-08: qty 5

## 2012-05-08 MED ORDER — BISACODYL 5 MG PO TBEC: 5.0000 mg | DELAYED_RELEASE_TABLET | Freq: Every day | ORAL | Status: DC | PRN

## 2012-05-08 MED ORDER — METHOCARBAMOL 500 MG PO TABS
ORAL_TABLET | ORAL | Status: AC
  Filled 2012-05-08: qty 1

## 2012-05-08 MED ORDER — AMOXICILLIN-POT CLAVULANATE 875-125 MG PO TABS: 1.0000 | ORAL_TABLET | Freq: Two times a day (BID) | ORAL | Status: DC

## 2012-05-08 MED ORDER — ONDANSETRON HCL 4 MG PO TABS: 4.0000 mg | ORAL_TABLET | Freq: Four times a day (QID) | ORAL | Status: DC | PRN

## 2012-05-08 MED ORDER — NEOSTIGMINE METHYLSULFATE 1 MG/ML IJ SOLN
INTRAMUSCULAR | Status: DC | PRN
  Administered 2012-05-08: 3 mg via INTRAVENOUS

## 2012-05-08 MED ORDER — PHENOL 1.4 % MT LIQD: 1.0000 | OROMUCOSAL | Status: DC | PRN

## 2012-05-08 MED ORDER — GEMFIBROZIL 600 MG PO TABS
600.0000 mg | ORAL_TABLET | Freq: Two times a day (BID) | ORAL | Status: DC
  Administered 2012-05-09 – 2012-05-11 (×5): 600 mg via ORAL
  Filled 2012-05-08 (×9): qty 1

## 2012-05-08 MED ORDER — MENTHOL 3 MG MT LOZG
1.0000 | LOZENGE | OROMUCOSAL | Status: DC | PRN
  Administered 2012-05-09: 3 mg via ORAL
  Filled 2012-05-08 (×2): qty 9

## 2012-05-08 MED ORDER — METHOCARBAMOL 500 MG PO TABS
500.0000 mg | ORAL_TABLET | Freq: Four times a day (QID) | ORAL | Status: DC | PRN
  Administered 2012-05-08 – 2012-05-11 (×8): 500 mg via ORAL
  Filled 2012-05-08 (×8): qty 1

## 2012-05-08 MED ORDER — CEFAZOLIN SODIUM 1-5 GM-% IV SOLN
INTRAVENOUS | Status: AC
  Filled 2012-05-08: qty 100

## 2012-05-08 MED ORDER — ROCURONIUM BROMIDE 100 MG/10ML IV SOLN
INTRAVENOUS | Status: DC | PRN
  Administered 2012-05-08: 50 mg via INTRAVENOUS

## 2012-05-08 MED ORDER — BUPIVACAINE-EPINEPHRINE (PF) 0.5% -1:200000 IJ SOLN
INTRAMUSCULAR | Status: AC
  Filled 2012-05-08: qty 10

## 2012-05-08 MED ORDER — PROPOFOL 10 MG/ML IV EMUL
INTRAVENOUS | Status: DC | PRN
  Administered 2012-05-08: 200 mg via INTRAVENOUS

## 2012-05-08 MED ORDER — FLEET ENEMA 7-19 GM/118ML RE ENEM: 1.0000 | ENEMA | Freq: Once | RECTAL | Status: AC | PRN

## 2012-05-08 MED ORDER — ALUM & MAG HYDROXIDE-SIMETH 200-200-20 MG/5ML PO SUSP: 30.0000 mL | ORAL | Status: DC | PRN

## 2012-05-08 MED ORDER — PANTOPRAZOLE SODIUM 40 MG PO TBEC
40.0000 mg | DELAYED_RELEASE_TABLET | Freq: Every day | ORAL | Status: DC
  Administered 2012-05-09 – 2012-05-11 (×3): 40 mg via ORAL
  Filled 2012-05-08 (×3): qty 1

## 2012-05-08 MED ORDER — METOCLOPRAMIDE HCL 10 MG PO TABS: 5.0000 mg | ORAL_TABLET | Freq: Three times a day (TID) | ORAL | Status: DC | PRN

## 2012-05-08 MED ORDER — SODIUM CHLORIDE 0.9 % IR SOLN
Status: DC | PRN
  Administered 2012-05-08: 1000 mL

## 2012-05-08 MED ORDER — DIPHENHYDRAMINE HCL 12.5 MG/5ML PO ELIX
12.5000 mg | ORAL_SOLUTION | ORAL | Status: DC | PRN
  Filled 2012-05-08: qty 10

## 2012-05-08 MED ORDER — ACETAMINOPHEN 10 MG/ML IV SOLN
INTRAVENOUS | Status: DC | PRN
  Administered 2012-05-08: 1000 mg via INTRAVENOUS

## 2012-05-08 MED ORDER — FLUTICASONE PROPIONATE 50 MCG/ACT NA SUSP
2.0000 | Freq: Every day | NASAL | Status: DC
  Administered 2012-05-10: 2 via NASAL
  Filled 2012-05-08: qty 16

## 2012-05-08 MED ORDER — DM-GUAIFENESIN ER 30-600 MG PO TB12: 1.0000 | ORAL_TABLET | Freq: Two times a day (BID) | ORAL | Status: DC

## 2012-05-08 MED ORDER — IBUPROFEN-DIPHENHYDRAMINE HCL 200-25 MG PO CAPS: 2.0000 | ORAL_CAPSULE | Freq: Every evening | ORAL | Status: DC | PRN

## 2012-05-08 MED ORDER — OXYCODONE HCL 5 MG PO TABS
5.0000 mg | ORAL_TABLET | ORAL | Status: DC | PRN
  Administered 2012-05-08 (×2): 10 mg via ORAL
  Administered 2012-05-09: 5 mg via ORAL
  Administered 2012-05-09 – 2012-05-11 (×9): 10 mg via ORAL
  Filled 2012-05-08 (×12): qty 2

## 2012-05-08 MED ORDER — MIDAZOLAM HCL 5 MG/5ML IJ SOLN
INTRAMUSCULAR | Status: DC | PRN
  Administered 2012-05-08: 2 mg via INTRAVENOUS

## 2012-05-08 MED ORDER — OXYCODONE HCL 5 MG PO TABS
ORAL_TABLET | ORAL | Status: AC
  Filled 2012-05-08: qty 2

## 2012-05-08 MED ORDER — MAGNESIUM HYDROXIDE 400 MG/5ML PO SUSP: 30.0000 mL | Freq: Every day | ORAL | Status: DC | PRN

## 2012-05-08 MED ORDER — HYDROMORPHONE HCL PF 1 MG/ML IJ SOLN
0.2500 mg | INTRAMUSCULAR | Status: DC | PRN
  Administered 2012-05-08 (×3): 0.5 mg via INTRAVENOUS

## 2012-05-08 MED ORDER — FENTANYL CITRATE 0.05 MG/ML IJ SOLN
INTRAMUSCULAR | Status: DC | PRN
  Administered 2012-05-08 (×3): 50 ug via INTRAVENOUS
  Administered 2012-05-08: 100 ug via INTRAVENOUS

## 2012-05-08 MED ORDER — LIDOCAINE HCL (CARDIAC) 20 MG/ML IV SOLN
INTRAVENOUS | Status: DC | PRN
  Administered 2012-05-08: 80 mg via INTRAVENOUS

## 2012-05-08 MED ORDER — ONDANSETRON HCL 4 MG/2ML IJ SOLN
4.0000 mg | Freq: Four times a day (QID) | INTRAMUSCULAR | Status: DC | PRN
  Administered 2012-05-09: 4 mg via INTRAVENOUS
  Filled 2012-05-08: qty 2

## 2012-05-08 MED ORDER — GLYCOPYRROLATE 0.2 MG/ML IJ SOLN
INTRAMUSCULAR | Status: DC | PRN
  Administered 2012-05-08: 0.4 mg via INTRAVENOUS

## 2012-05-08 MED ORDER — ACETAMINOPHEN 325 MG PO TABS
650.0000 mg | ORAL_TABLET | Freq: Four times a day (QID) | ORAL | Status: DC | PRN
  Filled 2012-05-08: qty 2

## 2012-05-08 MED ORDER — LACTATED RINGERS IV SOLN
INTRAVENOUS | Status: DC
  Administered 2012-05-08: 09:00:00 via INTRAVENOUS

## 2012-05-08 MED ORDER — ONDANSETRON HCL 4 MG/2ML IJ SOLN: 4.0000 mg | Freq: Once | INTRAMUSCULAR | Status: DC | PRN

## 2012-05-08 MED ORDER — TRIAMTERENE-HCTZ 75-50 MG PO TABS
1.0000 | ORAL_TABLET | Freq: Every day | ORAL | Status: DC
  Administered 2012-05-09 – 2012-05-11 (×3): 1 via ORAL
  Filled 2012-05-08 (×4): qty 1

## 2012-05-08 MED ORDER — LACTATED RINGERS IV SOLN
INTRAVENOUS | Status: DC | PRN
  Administered 2012-05-08 (×2): via INTRAVENOUS

## 2012-05-08 MED ORDER — ACETAMINOPHEN 10 MG/ML IV SOLN
INTRAVENOUS | Status: AC
  Filled 2012-05-08: qty 100

## 2012-05-08 MED ORDER — METOCLOPRAMIDE HCL 5 MG/ML IJ SOLN: 5.0000 mg | Freq: Three times a day (TID) | INTRAMUSCULAR | Status: DC | PRN

## 2012-05-08 MED ORDER — ACETAMINOPHEN 650 MG RE SUPP: 650.0000 mg | Freq: Four times a day (QID) | RECTAL | Status: DC | PRN

## 2012-05-08 MED ORDER — ACETAMINOPHEN 10 MG/ML IV SOLN
1000.0000 mg | Freq: Four times a day (QID) | INTRAVENOUS | Status: AC
  Administered 2012-05-08 – 2012-05-09 (×4): 1000 mg via INTRAVENOUS
  Filled 2012-05-08 (×5): qty 100

## 2012-05-08 MED ORDER — HYDROMORPHONE HCL PF 1 MG/ML IJ SOLN
INTRAMUSCULAR | Status: AC
  Administered 2012-05-10: 1 mg via INTRAVENOUS
  Filled 2012-05-08: qty 1

## 2012-05-08 MED ORDER — ONDANSETRON HCL 4 MG/2ML IJ SOLN
INTRAMUSCULAR | Status: DC | PRN
  Administered 2012-05-08: 4 mg via INTRAVENOUS

## 2012-05-08 SURGICAL SUPPLY — 53 items
BLADE SAW SAG 73X25 THK (BLADE) ×1
BLADE SAW SGTL 18X1.27X75 (BLADE) IMPLANT
BLADE SAW SGTL 73X25 THK (BLADE) ×1 IMPLANT
BLADE SAW SGTL MED 73X18.5 STR (BLADE) IMPLANT
BRUSH FEMORAL CANAL (MISCELLANEOUS) IMPLANT
CLOTH BEACON ORANGE TIMEOUT ST (SAFETY) ×2 IMPLANT
COVER BACK TABLE 24X17X13 BIG (DRAPES) IMPLANT
COVER SURGICAL LIGHT HANDLE (MISCELLANEOUS) ×4 IMPLANT
DRAPE ORTHO SPLIT 77X108 STRL (DRAPES) ×1
DRAPE PROXIMA HALF (DRAPES) ×2 IMPLANT
DRAPE SURG ORHT 6 SPLT 77X108 (DRAPES) ×1 IMPLANT
DRAPE U-SHAPE 47X51 STRL (DRAPES) ×2 IMPLANT
DRILL BIT 7/64X5 (BIT) ×2 IMPLANT
DRSG MEPILEX BORDER 4X12 (GAUZE/BANDAGES/DRESSINGS) ×2 IMPLANT
DRSG MEPILEX BORDER 4X8 (GAUZE/BANDAGES/DRESSINGS) ×2 IMPLANT
DURAPREP 26ML APPLICATOR (WOUND CARE) ×2 IMPLANT
ELECT BLADE 4.0 EZ CLEAN MEGAD (MISCELLANEOUS)
ELECT REM PT RETURN 9FT ADLT (ELECTROSURGICAL) ×2
ELECTRODE BLDE 4.0 EZ CLN MEGD (MISCELLANEOUS) IMPLANT
ELECTRODE REM PT RTRN 9FT ADLT (ELECTROSURGICAL) ×1 IMPLANT
GAUZE XEROFORM 1X8 LF (GAUZE/BANDAGES/DRESSINGS) ×2 IMPLANT
GLOVE BIO SURGEON STRL SZ7 (GLOVE) ×2 IMPLANT
GLOVE BIO SURGEON STRL SZ7.5 (GLOVE) ×2 IMPLANT
GLOVE BIOGEL PI IND STRL 7.0 (GLOVE) ×1 IMPLANT
GLOVE BIOGEL PI IND STRL 8 (GLOVE) ×1 IMPLANT
GLOVE BIOGEL PI INDICATOR 7.0 (GLOVE) ×1
GLOVE BIOGEL PI INDICATOR 8 (GLOVE) ×1
GOWN PREVENTION PLUS XLARGE (GOWN DISPOSABLE) ×2 IMPLANT
GOWN STRL NON-REIN LRG LVL3 (GOWN DISPOSABLE) ×4 IMPLANT
HANDPIECE INTERPULSE COAX TIP (DISPOSABLE)
HOOD PEEL AWAY FACE SHEILD DIS (HOOD) ×4 IMPLANT
KIT BASIN OR (CUSTOM PROCEDURE TRAY) ×2 IMPLANT
KIT ROOM TURNOVER OR (KITS) ×2 IMPLANT
MANIFOLD NEPTUNE II (INSTRUMENTS) ×2 IMPLANT
NEEDLE 22X1 1/2 (OR ONLY) (NEEDLE) ×2 IMPLANT
NS IRRIG 1000ML POUR BTL (IV SOLUTION) ×2 IMPLANT
PACK TOTAL JOINT (CUSTOM PROCEDURE TRAY) ×2 IMPLANT
PAD ARMBOARD 7.5X6 YLW CONV (MISCELLANEOUS) ×4 IMPLANT
PASSER SUT SWANSON 36MM LOOP (INSTRUMENTS) ×2 IMPLANT
PRESSURIZER FEMORAL UNIV (MISCELLANEOUS) IMPLANT
SET HNDPC FAN SPRY TIP SCT (DISPOSABLE) IMPLANT
SUT ETHIBOND 2 V 37 (SUTURE) ×2 IMPLANT
SUT ETHILON 3 0 FSL (SUTURE) ×2 IMPLANT
SUT VIC AB 0 CTB1 27 (SUTURE) ×2 IMPLANT
SUT VIC AB 1 CTX 36 (SUTURE) ×1
SUT VIC AB 1 CTX36XBRD ANBCTR (SUTURE) ×1 IMPLANT
SUT VIC AB 2-0 CTB1 (SUTURE) ×2 IMPLANT
SYR CONTROL 10ML LL (SYRINGE) ×2 IMPLANT
TOWEL OR 17X24 6PK STRL BLUE (TOWEL DISPOSABLE) ×2 IMPLANT
TOWEL OR 17X26 10 PK STRL BLUE (TOWEL DISPOSABLE) ×2 IMPLANT
TOWER CARTRIDGE SMART MIX (DISPOSABLE) IMPLANT
TRAY FOLEY CATH 14FR (SET/KITS/TRAYS/PACK) IMPLANT
WATER STERILE IRR 1000ML POUR (IV SOLUTION) ×8 IMPLANT

## 2012-05-08 NOTE — Progress Notes (Signed)
UR COMPLETED  

## 2012-05-08 NOTE — Preoperative (Signed)
Beta Blockers   Reason not to administer Beta Blockers:Not Applicable 

## 2012-05-08 NOTE — Progress Notes (Signed)
pcxr done as ordered 

## 2012-05-08 NOTE — Anesthesia Postprocedure Evaluation (Signed)
  Anesthesia Post-op Note  Patient: Kathleen Wiley  Procedure(s) Performed: Procedure(s) (LRB): TOTAL HIP ARTHROPLASTY (Left)  Patient Location: PACU  Anesthesia Type: General  Level of Consciousness: awake, alert  and oriented  Airway and Oxygen Therapy: Patient Spontanous Breathing  Post-op Pain: mild  Post-op Assessment: Post-op Vital signs reviewed  Post-op Vital Signs: Reviewed  Complications: No apparent anesthesia complications

## 2012-05-08 NOTE — Transfer of Care (Signed)
Immediate Anesthesia Transfer of Care Note  Patient: Kathleen Wiley  Procedure(s) Performed: Procedure(s) (LRB): TOTAL HIP ARTHROPLASTY (Left)  Patient Location: PACU  Anesthesia Type: General  Level of Consciousness: awake, alert  and oriented  Airway & Oxygen Therapy: Patient Spontanous Breathing and Patient connected to face mask oxygen  Post-op Assessment: Report given to PACU RN and Post -op Vital signs reviewed and stable  Post vital signs: Reviewed and stable  Complications: No apparent anesthesia complications

## 2012-05-08 NOTE — Interval H&P Note (Signed)
History and Physical Interval Note:  05/08/2012 8:46 AM  Kathleen Wiley  has presented today for surgery, with the diagnosis of DEGENERATIVE JOINT DISEASE LEFT HIP  The various methods of treatment have been discussed with the patient and family. After consideration of risks, benefits and other options for treatment, the patient has consented to  Procedure(s) (LRB): TOTAL HIP ARTHROPLASTY (Left) as a surgical intervention .  The patient's history has been reviewed, patient examined, no change in status, stable for surgery.  I have reviewed the patients' chart and labs.  Questions were answered to the patient's satisfaction.     [name]

## 2012-05-08 NOTE — Anesthesia Postprocedure Evaluation (Signed)
  Anesthesia Post-op Note  Patient: Kathleen Wiley  Procedure(s) Performed: Procedure(s) (LRB): TOTAL HIP ARTHROPLASTY (Left)  Patient Location: PACU  Anesthesia Type: General  Level of Consciousness: awake, alert  and oriented  Airway and Oxygen Therapy: Patient Spontanous Breathing  Post-op Pain: mild  Post-op Assessment: Post-op Vital signs reviewed  Post-op Vital Signs: Reviewed  Complications: No apparent anesthesia complications 

## 2012-05-08 NOTE — Op Note (Signed)
OPERATIVE REPORT    DATE OF PROCEDURE:  05/08/2012       PREOPERATIVE DIAGNOSIS:  DEGENERATIVE JOINT DISEASE LEFT HIP                                                          POSTOPERATIVE DIAGNOSIS:  osteoarthritis left hip                                                            PROCEDURE:  L total hip arthroplasty using a 52 mm DePuy Pinnacle  Cup, [employer], 10-degree polyethylene liner index superior  and posterior, a +3 36 mm ceramic head, a [phone] SROM stem, 18Bsm Cone   SURGEON: Granville Whitefield J    ASSISTANT:   Mauricia Area, PA-C  (present throughout entire procedure and necessary for timely completion of the procedure)   ANESTHESIA: General BLOOD LOSS: 600 FLUID REPLACEMENT: 1800 crystalloid DRAINS: Foley Catheter URINE OUTPUT: 300cc COMPLICATIONS: none    INDICATIONS FOR PROCEDURE: A 66 y.o. year-old With  DEGENERATIVE JOINT DISEASE LEFT HIP   for 3 years, x-rays show bone-on-bone arthritic changes. Despite conservative measures with observation, anti-inflammatory medicine, narcotics, use of a cane, has severe unremitting pain and can ambulate only a few blocks before resting.  Patient desires elective L total hip arthroplasty to decrease pain and increase function. The risks, benefits, and alternatives were discussed at length including but not limited to the risks of infection, bleeding, nerve injury, stiffness, blood clots, the need for revision surgery, cardiopulmonary complications, among others, and they were willing to proceed.y have been discussed. 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 Questions answered.     PROCEDURE IN DETAIL: The patient was identified by armband,  received preoperative IV antibiotics in the holding area at [facility], taken to the operating room , appropriate anesthetic monitors  were attached and general endotracheal  anesthesia induced. Foley catheter was inserted. She was rolled into the R lateral decubitus position and fixed there with a Stulberg Mark II pelvic clamp and the L lower extremity was then prepped and draped  in the usual sterile fashion from the ankle to the hemipelvis. A time-out  procedure was performed. The skin along the lateral hip and thigh  infiltrated with 20 mL of 0.5% Marcaine and epinephrine solution. We  then made a posterolateral approach to the hip. With a #10 blade, 20 cm  incision through skin and subcutaneous tissue down to the level of the  IT band. Small bleeders were identified and cauterized. IT band cut in  line with skin incision exposing the greater trochanter. A Cobra retractor was placed between the gluteus minimus and the superior hip joint capsule, and a spiked Cobra between the quadratus femoris and the inferior hip joint capsule. This isolated the short  external rotators and piriformis tendons. These were tagged with a #2 Ethibond  suture and cut off their insertion on the intertrochanteric crest. The posterior  capsule was then developed into an acetabular-based flap from Posterior Superior off of  the acetabulum out over the femoral neck and back posterior inferior to the acetabular rim. This flap was tagged with two #2 Ethibond sutures and retracted protecting the sciatic nerve. This exposed the arthritic femoral head and osteophytes. The hip was then flexed and internally rotated, dislocating the femoral head and a standard neck cut performed 1 fingerbreadth above the lesser trochanter.  A spiked Cobra was placed in the cotyloid notch and a Hohmann retractor was then used to lever the femur anteriorly off of the anterior pelvic column. A posterior-inferior wing retractor was placed at the junction of the acetabulum and the ischium completing the acetabular exposure.We then removed the peripheral osteophytes and labrum from the acetabulum. We then reamed the acetabulum up  to 51 mm with basket reamers obtaining good coverage in all quadrants, irrigated out with normal  saline solution and hammered into place a 52 mm pinnacle cup in 45  degrees of abduction and about 20 degrees of anteversion. More  peripheral osteophytes removed and a trial 10-degree liner placed with the  index superior-posterior. The hip was then flexed and internally rotated exposing the  proximal femur, which was entered with the initiating reamer followed by  the axial reamers up to a 13.5 mm full depth and 14mm partial depth. We then conically reamed to 18B to the correct depth for a 42 base neck. The calcar was milled to 18Bsm. A trial cone and stem was inserted in the 25 degrees anteversion, with a +0 36mm trial head. Trial reduction was then performed and excellent stability was noted with at 90 of flexion with 75 of internal rotation and then full extension with maximal external rotation. The hip could not be dislocated in full extension. The knee could easily flex  to about 130 degrees. We also stretched the abductors at this point,  because of the preexisting adductor contractures. All trial components  were then removed. The acetabulum was irrigated out with normal saline  solution. A titanium [facility] was then screwed into place  followed by a 10-degree polyethylene liner index superior-posterior. On  the femoral side a 18Bsm ZTT1 cone was hammered into place, followed by a [phone] SROM stem in 25 degrees of anteversion. At this point, a +3 36 mm ceramic head was  hammered on the stem. The hip was reduced. We checked our stability  one more time and found to be excellent. The wound was once again  thoroughly irrigated out with normal saline solution pulse lavage. The  capsular flap and short external rotators were repaired back to the  intertrochanteric crest through drill holes with a #2 Ethibond suture.  The IT band was closed with running 1 Vicryl suture. The  subcutaneous  tissue with 0 and 2-0 undyed Vicryl suture and the skin with running  interlocking 3-0 nylon suture. Dressing of Xeroform and Mepilex was  then applied. The patient was then unclamped, rolled supine, awaken extubated and taken to recovery room without difficulty in stable condition.   Sameer Teeple J 05/08/2012, 10:59 AM

## 2012-05-08 NOTE — Plan of Care (Signed)
Problem: Consults Goal: Diagnosis- Total Joint Replacement Outcome: Completed/Met Date Met:  05/08/12 Primary Total Hip  Problem: Phase I Progression Outcomes Goal: Initial discharge plan identified Outcome: Completed/Met Date Met:  05/08/12 Plan is to discharge home with daughter.

## 2012-05-08 NOTE — Anesthesia Procedure Notes (Signed)
Procedure Name: Intubation Date/Time: 05/08/2012 9:33 AM Performed by: Arlice Colt B Pre-anesthesia Checklist: Patient identified, Emergency Drugs available, Suction available, Patient being monitored and Timeout performed Patient Re-evaluated:Patient Re-evaluated prior to inductionOxygen Delivery Method: Circle system utilized Preoxygenation: Pre-oxygenation with 100% oxygen Intubation Type: IV induction Ventilation: Mask ventilation without difficulty Laryngoscope Size: Mac and 3 Grade View: Grade I Tube type: Oral Tube size: 7.5 mm Number of attempts: 1 Airway Equipment and Method: Stylet Placement Confirmation: ETT inserted through vocal cords under direct vision,  positive ETCO2 and breath sounds checked- equal and bilateral Secured at: 21 cm Tube secured with: Tape Dental Injury: Teeth and Oropharynx as per pre-operative assessment

## 2012-05-08 NOTE — Anesthesia Preprocedure Evaluation (Signed)
Anesthesia Evaluation  Patient identified by MRN, date of birth, ID band Patient awake    Airway Mallampati: I TM Distance: >3 FB Neck ROM: Full    Dental  (+) Teeth Intact and Dental Advisory Given   Pulmonary  breath sounds clear to auscultation        Cardiovascular hypertension, Pt. on medications Rhythm:Regular Rate:Normal     Neuro/Psych    GI/Hepatic GERD-  Medicated and Controlled,  Endo/Other  Morbid obesity  Renal/GU      Musculoskeletal   Abdominal   Peds  Hematology   Anesthesia Other Findings   Reproductive/Obstetrics                           Anesthesia Physical Anesthesia Plan  ASA: III  Anesthesia Plan: General   Post-op Pain Management:    Induction: Intravenous  Airway Management Planned:   Additional Equipment:   Intra-op Plan:   Post-operative Plan:   Informed Consent: I have reviewed the patients History and Physical, chart, labs and discussed the procedure including the risks, benefits and alternatives for the proposed anesthesia with the patient or authorized representative who has indicated his/her understanding and acceptance.   Dental advisory given  Plan Discussed with: CRNA, Anesthesiologist and Surgeon  Anesthesia Plan Comments:         Anesthesia Quick Evaluation

## 2012-05-09 ENCOUNTER — Encounter (HOSPITAL_COMMUNITY): Payer: Self-pay | Admitting: Orthopedic Surgery

## 2012-05-09 LAB — CBC
MCHC: 33.8 g/dL (ref 30.0–36.0)
Platelets: 200 10*3/uL (ref 150–400)
RDW: 13.4 % (ref 11.5–15.5)
WBC: 5.9 10*3/uL (ref 4.0–10.5)

## 2012-05-09 LAB — BASIC METABOLIC PANEL
GFR calc Af Amer: >90 mL/min (ref >90)
GFR calc non Af Amer: >90 mL/min (ref >90)
Potassium: 3.7 mEq/L (ref 3.5–5.1)
Sodium: 134 mEq/L — ABNORMAL LOW (ref 135–145)

## 2012-05-09 NOTE — Evaluation (Signed)
Physical Therapy Evaluation Patient Details Name: Kathleen Wiley MRN: [id number] DOB: 07/18/1946 Today's Date: 05/09/2012 Time: 4098-1191 PT Time Calculation (min): 26 min  PT Assessment / Plan / Recommendation Clinical Impression  Pt is a 66 y/o female admitted s/p left THA along with the below PT problem list.  Pt limited by dizziness this am, but would benefit from acute PT to maximize independence while facilitating d/c home with HHPT.    PT Assessment  Patient needs continued PT services    Follow Up Recommendations  Home health PT    Barriers to Discharge None      Equipment Recommendations  None recommended by PT    Recommendations for Other Services     Frequency 7X/week    Precautions / Restrictions Precautions Precautions: Posterior Hip Precaution Booklet Issued: Yes (comment) Precaution Comments: Educated pt on 3/3 posterior hip precautions. Restrictions Weight Bearing Restrictions: Yes LLE Weight Bearing: Weight bearing as tolerated   Pertinent Vitals/Pain 4/10 in left hip.  Pt repositioned and premedicated.      Mobility  Bed Mobility Bed Mobility: Supine to Sit Supine to Sit: 3: Mod assist Details for Bed Mobility Assistance: Assist for trunk to translate anterior as well as left LE due to pain with cues for sequence to maintain posterior hip precautions. Transfers Transfers: Sit to Stand;Stand to Sit Sit to Stand: 1: +2 Total assist;With upper extremity assist;From bed Sit to Stand: Patient Percentage: 60% Stand to Sit: 4: Min assist;With upper extremity assist;To chair/3-in-1 Details for Transfer Assistance: Assist to translate trunk anterior over BOS and off weight left LE due to pain with cues for safest hand/left LE placement. Ambulation/Gait Ambulation/Gait Assistance: 4: Min assist Ambulation Distance (Feet): 5 Feet Assistive device: Rolling walker Gait Pattern: Step-to pattern;Decreased step length - left;Decreased stance time - left;Trunk  flexed;Shuffle General Gait Details: Assist to off weight left LE due to pain as well as for balance with cues for safest sequence while advancing RW.  Distance limited by increased dizziness with mobility. Stairs: No Wheelchair Mobility Wheelchair Mobility: No    Exercises Total Joint Exercises Ankle Circles/Pumps: AROM;Left;10 reps;Supine Quad Sets: AROM;Left;10 reps;Supine Heel Slides: AROM;Left;10 reps;Supine   PT Diagnosis: Difficulty walking;Acute pain  PT Problem List: Decreased strength;Decreased activity tolerance;Decreased balance;Decreased mobility;Decreased knowledge of use of DME;Decreased knowledge of precautions;Pain PT Treatment Interventions: DME instruction;Gait training;Functional mobility training;Therapeutic activities;Therapeutic exercise;Balance training;Patient/family education;Stair training   PT Goals Acute Rehab PT Goals PT Goal Formulation: With patient Time For Goal Achievement: 05/16/12 Potential to Achieve Goals: Fair Pt will go Supine/Side to Sit: with supervision PT Goal: Supine/Side to Sit - Progress: Goal set today Pt will go Sit to Supine/Side: with supervision PT Goal: Sit to Supine/Side - Progress: Goal set today Pt will go Sit to Stand: with supervision PT Goal: Sit to Stand - Progress: Goal set today Pt will go Stand to Sit: with supervision PT Goal: Stand to Sit - Progress: Goal set today Pt will Ambulate: 51 - 150 feet;with supervision;with least restrictive assistive device PT Goal: Ambulate - Progress: Goal set today Pt will Go Up / Down Stairs: Flight;with min assist;with least restrictive assistive device PT Goal: Up/Down Stairs - Progress: Goal set today Pt will Perform Home Exercise Program: with supervision, verbal cues required/provided PT Goal: Perform Home Exercise Program - Progress: Goal set today  Visit Information  Last PT Received On: 05/09/12 Assistance Needed: +2    Subjective Data  Subjective: "You aren't going to  hurt me, right?" Patient Stated Goal: Go  home.   Prior Functioning  Home Living Lives With: Spouse Available Help at Discharge: Family;Friend(s);Available 24 hours/day Type of Home: Apartment Home Access: Stairs to enter Entrance Stairs-Number of Steps: 2 Entrance Stairs-Rails: None Home Layout: Two level Alternate Level Stairs-Number of Steps: 12 Alternate Level Stairs-Rails: Left Bathroom Shower/Tub: Tub/shower unit;None Firefighter: Standard Home Adaptive Equipment: Walker - rolling;Straight cane;Bedside commode/3-in-1 Prior Function Level of Independence: Independent with assistive device(s) (With use of single point cane.) Able to Take Stairs?: Yes Driving: Yes Communication Communication: No difficulties    Cognition  Overall Cognitive Status: Appears within functional limits for tasks assessed/performed Arousal/Alertness: Awake/alert Orientation Level: Appears intact for tasks assessed Behavior During Session: [facility] for tasks performed    Extremity/Trunk Assessment Right Upper Extremity Assessment RUE ROM/Strength/Tone: Within functional levels RUE Sensation: WFL - Light Touch RUE Coordination: WFL - gross/fine motor Left Upper Extremity Assessment LUE ROM/Strength/Tone: Within functional levels LUE Sensation: WFL - Light Touch LUE Coordination: WFL - gross/fine motor Right Lower Extremity Assessment RLE ROM/Strength/Tone: Within functional levels RLE Sensation: WFL - Light Touch RLE Coordination: WFL - gross/fine motor Left Lower Extremity Assessment LLE ROM/Strength/Tone: Deficits;Due to pain LLE ROM/Strength/Tone Deficits: 2/5 throughout. LLE Sensation: WFL - Light Touch LLE Coordination: WFL - gross/fine motor Trunk Assessment Trunk Assessment: Normal   Balance Balance Balance Assessed: No  End of Session PT - End of Session Equipment Utilized During Treatment: Gait belt Activity Tolerance: Treatment limited secondary to medical complications  (Comment) Patient left: in chair;with call bell/phone within reach;with nursing in room Nurse Communication: Mobility status  GP     Cephus Shelling 05/09/2012, 12:23 PM  05/09/2012 Cephus Shelling, PT, DPT [phone]

## 2012-05-09 NOTE — Progress Notes (Signed)
Physical Therapy Treatment Patient Details Name: Kathleen Wiley MRN: [id number] DOB: 05/31/1946 Today's Date: 05/09/2012 Time: 7253-6644 PT Time Calculation (min): 21 min  PT Assessment / Plan / Recommendation Comments on Treatment Session  Pt admitted s/p left THA and continues to progress.  Pt able to tolerate BID ambulation today with increased distance as well as independence this pm.  Still with some dizziness during gait as well as fatigue limiting pt.    Follow Up Recommendations  Home health PT    Barriers to Discharge        Equipment Recommendations  None recommended by PT    Recommendations for Other Services    Frequency 7X/week   Plan Discharge plan remains appropriate;Frequency remains appropriate    Precautions / Restrictions Precautions Precautions: Posterior Hip Precaution Booklet Issued: No Precaution Comments: Pt able to recall 3/3 posterior hip precautions. Restrictions Weight Bearing Restrictions: Yes LLE Weight Bearing: Weight bearing as tolerated   Pertinent Vitals/Pain 7/10 in left hip.  Pt repositioned and RN notified.    Mobility  Bed Mobility Bed Mobility: Sit to Supine Sit to Supine: 3: Mod assist;HOB flat Details for Bed Mobility Assistance: Assist for left LE to maintain posterior hip precautions as well as for trunk to slow descent.  Cues for sequence. Transfers Transfers: Sit to Stand;Stand to Sit Sit to Stand: 4: Min assist;With upper extremity assist;From chair/3-in-1 Stand to Sit: 4: Min assist;With upper extremity assist;To bed Details for Transfer Assistance: Assist to translate anterior over BOS as well as slow descent.  Cues for safest hand/left LE placement. Ambulation/Gait Ambulation/Gait Assistance: 4: Min assist Ambulation Distance (Feet): 20 Feet Assistive device: Rolling walker Ambulation/Gait Assistance Details: Assist for balance with cues for sequence including tall posture, RW safety, step-to with initial contact on left  heel.  Pt initially requiring assistance to advance left LE and progressing to advancing it independently. Gait Pattern: Step-to pattern;Decreased step length - left;Decreased stance time - left;Trunk flexed;Shuffle Gait velocity: Very slowed. Stairs: No Wheelchair Mobility Wheelchair Mobility: No    Exercises     PT Diagnosis:    PT Problem List:   PT Treatment Interventions:     PT Goals Acute Rehab PT Goals PT Goal Formulation: With patient Time For Goal Achievement: 05/16/12 Potential to Achieve Goals: Fair PT Goal: Sit to Supine/Side - Progress: Progressing toward goal PT Goal: Sit to Stand - Progress: Progressing toward goal PT Goal: Stand to Sit - Progress: Progressing toward goal PT Goal: Ambulate - Progress: Progressing toward goal  Visit Information  Last PT Received On: 05/09/12 Assistance Needed: +1 PT/OT Co-Evaluation/Treatment: Yes    Subjective Data  Subjective: "I can do it." Patient Stated Goal: Go home.   Cognition  Overall Cognitive Status: Appears within functional limits for tasks assessed/performed Arousal/Alertness: Awake/alert Orientation Level: Appears intact for tasks assessed Behavior During Session: [facility] for tasks performed    Balance  Balance Balance Assessed: No  End of Session PT - End of Session Equipment Utilized During Treatment: Gait belt Activity Tolerance: Patient tolerated treatment well;Patient limited by fatigue Patient left: in chair;with call bell/phone within reach;with nursing in room Nurse Communication: Mobility status   GP     Cephus Shelling 05/09/2012, 2:50 PM  05/09/2012 Cephus Shelling, PT, DPT [phone]

## 2012-05-09 NOTE — Care Management Note (Signed)
    Page 1 of 1   05/09/2012     10:30:30 AM   CARE MANAGEMENT NOTE 05/09/2012  Patient:  Kathleen Wiley, Kathleen Wiley   Account Number:  [id number]  Date Initiated:  05/09/2012  Documentation initiated by:  Anette Guarneri  Subjective/Objective Assessment:   POD#1 s/p left TKA  from home, independent pta  plans to d/c home with daughters assistance     Action/Plan:   home w/HHPT/OT and DME  per patient/hospital bed was delivered to home yesterday  will need RW, ?tub transfer bench   Anticipated DC Date:  05/11/2012   Anticipated DC Plan:  HOME W HOME HEALTH SERVICES      DC Planning Services  CM consult      Choice offered to / List presented to:             Status of service:  In process, will continue to follow Medicare Important Message given?   (If response is "NO", the following Medicare IM given date fields will be blank) Date Medicare IM given:   Date Additional Medicare IM given:    Discharge Disposition:  HOME W HOME HEALTH SERVICES  Per UR Regulation:  Reviewed for med. necessity/level of care/duration of stay  If discussed at Long Length of Stay Meetings, dates discussed:    Comments:  05/09/12  10:28  Anette Guarneri RN/CM spoke with patient regarding d/c needs. Patient states that she has had a hospital bed delivered to home yesterday, has walker at home but is not RW, plans to d/c home with daughter assisting. HH services pre-arranged by MD office with [facility].

## 2012-05-09 NOTE — Progress Notes (Addendum)
Patient ID: Kathleen Wiley, female   DOB: 27-Aug-1946, 66 y.o.   MRN: [id number] PATIENT ID: Kathleen Wiley  MRN: [id number]  DOB/AGE:  [birth year] / 66 y.o.  1 Day Post-Op Procedure(s) (LRB): TOTAL HIP ARTHROPLASTY (Left)    PROGRESS NOTE Subjective: Patient is alert, oriented,no Nausea, no Vomiting, no passing gas, no Bowel Movement. Taking PO well. Denies SOB, Chest or Calf Pain. Using Incentive Spirometer, PAS in place. Ambulate WBAT Patient reports pain as 7 on 0-10 scale  .    Objective: Vital signs in last 24 hours: Filed Vitals:   05/08/12 1440 05/08/12 2303 05/09/12 0220 05/09/12 0704  BP: 103/57 105/69 110/70 112/67  Pulse: 69 66 65 66  Temp: 98.1 F (36.7 C) 97.3 F (36.3 C) 97.4 F (36.3 C) 97.9 F (36.6 C)  TempSrc:      Resp: 18 20 22 20   SpO2: 97% 97% 98% 97%      Intake/Output from previous day: I/O last 3 completed shifts: In: 1925 [I.V.:1825; IV Piggyback:100] Out: 600 [Blood:600]   Intake/Output this shift:     LABORATORY DATA:  Basename 05/09/12 0408  WBC 5.9  HGB 10.2*  HCT 30.2*  PLT 200  NA 134*  K 3.7  CL 100  CO2 25  BUN 16  CREATININE 0.64  GLUCOSE 154*  GLUCAP --  INR --  CALCIUM 7.9*    Examination: Neurologically intact ABD soft Neurovascular intact Sensation intact distally Intact pulses distally Dorsiflexion/Plantar flexion intact Incision: scant drainage No cellulitis present Compartment soft} XR AP&Lat of hip shows well placed\fixed THA  Assessment:   1 Day Post-Op Procedure(s) (LRB): TOTAL HIP ARTHROPLASTY (Left) ADDITIONAL DIAGNOSIS:    Plan: PT/OT WBAT, THA  posterior precautions, the Foley has been discontinued and the Foley bag was full but the volume has not been recorded at the time of this note.  DVT Prophylaxis: SCDx72 hrs, ASA 325 mg BID x 2 weeks  DISCHARGE PLAN: Home  DISCHARGE NEEDS: HHPT, HHRN, CPM, Walker and 3-in-1 comode seat

## 2012-05-09 NOTE — Evaluation (Signed)
Occupational Therapy Evaluation Patient Details Name: Kathleen Wiley MRN: [id number] DOB: July 11, 1946 Today's Date: 05/09/2012 Time: 7829-5621 OT Time Calculation (min): 23 min  OT Assessment / Plan / Recommendation Clinical Impression  This 66 yo female s/p LTHR presents to acute OT with problems below. Will benefit from acute OT to get to an I/Mod I level    OT Assessment  Patient needs continued OT Services    Follow Up Recommendations  Home health OT    Barriers to Discharge None    Equipment Recommendations  None recommended by OT    Recommendations for Other Services    Frequency  Min 2X/week    Precautions / Restrictions Precautions Precautions: Posterior Hip Precaution Booklet Issued: No Precaution Comments: Pt able to recall 3/3 posterior hip precautions. Restrictions Weight Bearing Restrictions: Yes LLE Weight Bearing: Weight bearing as tolerated   Pertinent Vitals/Pain 7/10 LLE    ADL  Eating/Feeding: Simulated;Independent Where Assessed - Eating/Feeding: Bed level Grooming: Simulated;Set up Where Assessed - Grooming: Unsupported sitting Upper Body Bathing: Simulated;Set up Where Assessed - Upper Body Bathing: Supported standing Lower Body Bathing: Simulated;Maximal assistance Where Assessed - Lower Body Bathing: Supported sit to stand Upper Body Dressing: Simulated;Set up Where Assessed - Upper Body Dressing: Unsupported sitting Lower Body Dressing: Simulated;+1 Total assistance Where Assessed - Lower Body Dressing: Supported sit to stand Toilet Transfer: Simulated;Minimal assistance Toilet Transfer Method: Sit to stand (From chair around bed to sit) Toileting - Architect and Hygiene: Simulated;Min guard Where Assessed - Toileting Clothing Manipulation and Hygiene: Standing Transfers/Ambulation Related to ADLs: Min A ADL Comments: Pt able to state 3/3 posterior hip precautions    OT Diagnosis: Generalized weakness;Acute pain  OT Problem  List: Decreased strength;Impaired balance (sitting and/or standing);Decreased range of motion;Pain;Decreased knowledge of precautions;Decreased knowledge of use of DME or AE OT Treatment Interventions: Self-care/ADL training;Balance training;Patient/family education   OT Goals Acute Rehab OT Goals OT Goal Formulation: With patient Time For Goal Achievement: 05/16/12 Potential to Achieve Goals: Good ADL Goals Pt Will Perform Grooming: with set-up;Unsupported;Standing at sink (2 tasks) ADL Goal: Grooming - Progress: Goal set today Pt Will Perform Lower Body Bathing: with set-up;Unsupported;with adaptive equipment;Sit to stand from chair;Sit to stand from bed ADL Goal: Lower Body Bathing - Progress: Goal set today Pt Will Perform Lower Body Dressing: with set-up;Unsupported;Sit to stand from bed;Sit to stand from chair;with adaptive equipment ADL Goal: Lower Body Dressing - Progress: Goal set today Miscellaneous OT Goals Miscellaneous OT Goal #1: The patient will be able to get in/OOB Mod I for BADLs. OT Goal: Miscellaneous Goal #1 - Progress: Goal set today  Visit Information  Last OT Received On: 05/09/12 Assistance Needed: +1 PT/OT Co-Evaluation/Treatment: Yes    Subjective Data  Patient Stated Goal: Get back to walking in the park and [place] the beach   Prior Functioning  Home Living Lives With: Spouse Available Help at Discharge: Family;Friend(s);Available 24 hours/day Type of Home: Apartment Home Access: Stairs to enter Entrance Stairs-Number of Steps: 2 Entrance Stairs-Rails: None Home Layout: Two level Alternate Level Stairs-Number of Steps: 12 Alternate Level Stairs-Rails: Left Bathroom Shower/Tub: Tub/shower unit;Door [employer] Toilet: Standard Home Adaptive Equipment: Walker - rolling;Straight cane;Bedside commode/3-in-1 Prior Function Level of Independence: Independent with assistive device(s) (single point cane) Able to Take Stairs?: Yes Driving:  Yes Communication Communication: No difficulties Dominant Hand: Right    Cognition  Overall Cognitive Status: Appears within functional limits for tasks assessed/performed Arousal/Alertness: Awake/alert Orientation Level: Appears intact for tasks assessed Behavior During Session:  WFL for tasks performed    Extremity/Trunk Assessment Right Upper Extremity Assessment RUE ROM/Strength/Tone: Within functional levels Left Upper Extremity Assessment LUE ROM/Strength/Tone: Within functional levels   Mobility Bed Mobility Bed Mobility: Sit to Supine Sit to Supine: 3: Mod assist;HOB flat Details for Bed Mobility Assistance: A for LLE Transfers Transfers: Sit to Stand;Stand to Sit Sit to Stand: 4: Min assist;With upper extremity assist;With armrests;From chair/3-in-1 Stand to Sit: 4: Min assist;With upper extremity assist;To bed Details for Transfer Assistance: Assist to translate anterior over BOS as well as slow descent.  Cues for safest hand/left LE placement.   Exercise    Balance Balance Balance Assessed: No  End of Session OT - End of Session Equipment Utilized During Treatment: Gait belt Activity Tolerance: Patient limited by pain Patient left: in bed;with call bell/phone within reach  GO     Evette Georges [phone] 05/09/2012, 3:57 PM

## 2012-05-09 NOTE — Progress Notes (Signed)
Orthopedic Tech Progress Note Patient Details:  [name] 08-28-1946 [id number]  Patient ID: Kathleen Wiley, female   DOB: [birth year], 66 y.o.   MRN: [id number] As requested per RN.  Ronald Londo T 05/09/2012, 1:32 PM

## 2012-05-10 DIAGNOSIS — M1612 Unilateral primary osteoarthritis, left hip: Secondary | ICD-10-CM | POA: Diagnosis present

## 2012-05-10 LAB — CBC
Hemoglobin: 10.5 g/dL — ABNORMAL LOW (ref 12.0–15.0)
MCHC: 34.3 g/dL (ref 30.0–36.0)
RBC: 3.21 MIL/uL — ABNORMAL LOW (ref 3.87–5.11)
WBC: 7 10*3/uL (ref 4.0–10.5)

## 2012-05-10 MED ORDER — METHOCARBAMOL 500 MG PO TABS: 500.0000 mg | ORAL_TABLET | Freq: Four times a day (QID) | ORAL | Status: AC | PRN

## 2012-05-10 MED ORDER — ASPIRIN 325 MG PO TBEC: 325.0000 mg | DELAYED_RELEASE_TABLET | Freq: Two times a day (BID) | ORAL | Status: AC

## 2012-05-10 MED ORDER — OXYCODONE-ACETAMINOPHEN 5-325 MG PO TABS: 1.0000 | ORAL_TABLET | ORAL | Status: AC | PRN

## 2012-05-10 NOTE — Progress Notes (Signed)
Occupational Therapy Treatment Patient Details Name: Kathleen Wiley MRN: [id number] DOB: 10-Nov-1945 Today's Date: 05/10/2012 Time: 2440-1027 OT Time Calculation (min): 22 min  OT Assessment / Plan / Recommendation Comments on Treatment Session Pt making slow progress due to pain    Follow Up Recommendations  Skilled nursing facility    Barriers to Discharge       Equipment Recommendations  Defer to next venue    Recommendations for Other Services    Frequency Min 2X/week   Plan Discharge plan needs to be updated    Precautions / Restrictions Precautions Precautions: Posterior Hip Precaution Booklet Issued: No Restrictions Weight Bearing Restrictions: Yes LLE Weight Bearing: Weight bearing as tolerated   Pertinent Vitals/Pain 7/10 LLE    ADL  Lower Body Dressing: Performed;Minimal assistance (with AE, to raise LLE heel off of floor) Where Assessed - Lower Body Dressing: Supported sitting ADL Comments: Pt's husband is to get her a reacher--advised her that she would probably do better with a longer one than standard (26")    OT Diagnosis:    OT Problem List:   OT Treatment Interventions:     OT Goals ADL Goals ADL Goal: Lower Body Dressing - Progress: Progressing toward goals  Visit Information  Last OT Received On: 05/10/12 Assistance Needed: +1    Subjective Data  Subjective: I really do not want to have to go to a rehab facility, but I think it would be better since I do not feel I am quite ready to go home   Prior Functioning       Cognition  Overall Cognitive Status: Appears within functional limits for tasks assessed/performed Arousal/Alertness: Awake/alert Orientation Level: Appears intact for tasks assessed Behavior During Session: [facility] for tasks performed    Mobility     Exercises    Balance    End of Session OT - End of Session Equipment Utilized During Treatment:  (Reacher) Activity Tolerance: Patient limited by pain Patient left: in  chair;with call bell/phone within reach       Evette Georges [phone] 05/10/2012, 10:06 AM

## 2012-05-10 NOTE — Discharge Summary (Signed)
Patient ID: Kathleen Wiley MRN: [id number] DOB/AGE: 1946-07-12 66 y.o.  Admit date: 05/08/2012 Discharge date: 05/11/12  Admission Diagnoses:  Active Problems:  Osteoarthritis of left hip   Discharge Diagnoses:  Same  Past Medical History  Diagnosis Date  . GERD (gastroesophageal reflux disease)   . Hypercholesteremia   . History of atrial fibrillation 2007    with flagyl  . Hypertension     has not been to a cardiologist  . Arthritis     knee, hip  . History of endometriosis     Surgeries: Procedure(s): TOTAL HIP ARTHROPLASTY on 05/08/2012   Consultants:    Discharged Condition: Improved  Hospital Course: Kathleen Wiley is an 66 y.o. female who was admitted 05/08/2012 for operative treatment of<principal problem not specified>. Patient has severe unremitting pain that affects sleep, daily activities, and work/hobbies. After pre-op clearance the patient was taken to the operating room on 05/08/2012 and underwent  Procedure(s): TOTAL HIP ARTHROPLASTY.    Patient was given perioperative antibiotics: Anti-infectives     Start     Dose/Rate Route Frequency Ordered Stop   05/08/12 1445   amoxicillin-clavulanate (AUGMENTIN) 875-125 MG per tablet 1 tablet  Status:  Discontinued        1 tablet Oral 2 times daily 05/08/12 1435 05/08/12 1508   05/07/12 1539   ceFAZolin (ANCEF) IVPB 2 g/50 mL premix        2 g 100 mL/hr over 30 Minutes Intravenous 60 min pre-op 05/07/12 1539 05/08/12 0936           Patient was given sequential compression devices, early ambulation, and chemoprophylaxis to prevent DVT.  Patient benefited maximally from hospital stay and there were no complications.    Recent vital signs: Patient Vitals for the past 24 hrs:  BP Temp Temp src Pulse Resp SpO2  05/10/12 0552 112/60 mmHg 99.9 F (37.7 C) Oral 101  18  97 %  06-02-2012 2205 109/54 mmHg 100.6 F (38.1 C) Oral 83  18  95 %  02-Jun-2012 1600 - - - - 17  99 %  06-02-12 1433 126/71 mmHg 98.7 F (37.1 C) -  84  18  96 %  Jun 02, 2012 1200 - - - - 17  98 %     Recent laboratory studies:  Basename 05/10/12 0500 06/02/2012 0408  WBC 7.0 5.9  HGB 10.5* 10.2*  HCT 30.6* 30.2*  PLT 198 200  NA -- 134*  K -- 3.7  CL -- 100  CO2 -- 25  BUN -- 16  CREATININE -- 0.64  GLUCOSE -- 154*  INR -- --  CALCIUM -- 7.9*     Discharge Medications:   Medication List  As of 05/10/2012  8:05 AM   STOP taking these medications         ADVIL PM 200-25 MG Caps      amoxicillin-clavulanate 875-125 MG per tablet      dextromethorphan-guaiFENesin 30-600 MG per 12 hr tablet      traMADol 50 MG tablet         TAKE these medications         aspirin 325 MG EC tablet   Take 1 tablet (325 mg total) by mouth 2 (two) times daily.      fluticasone 50 MCG/ACT nasal spray   Commonly known as: FLONASE   Place 2 sprays into the nose daily.      gemfibrozil 600 MG tablet   Commonly known as: LOPID   Take 600  mg by mouth 2 (two) times daily before a meal.      methocarbamol 500 MG tablet   Commonly known as: ROBAXIN   Take 1 tablet (500 mg total) by mouth every 6 (six) hours as needed.      omeprazole 20 MG capsule   Commonly known as: PRILOSEC   Take 20 mg by mouth daily.      oxyCODONE-acetaminophen 5-325 MG per tablet   Commonly known as: PERCOCET   Take 1-2 tablets by mouth every 4 (four) hours as needed for pain.      triamterene-hydrochlorothiazide 75-50 MG per tablet   Commonly known as: MAXZIDE   Take 1 tablet by mouth daily.            Diagnostic Studies: Dg Chest 2 View  05/03/2012  *RADIOLOGY REPORT*  Clinical Data: Preop for left hip replacement  CHEST - 2 VIEW  Comparison: 05/12/2006  Findings:   Cardiomegaly again noted.  No acute infiltrate or pleural effusion.  No pulmonary edema.  Stable mild degenerative changes thoracic spine.  IMPRESSION: No active disease.  No significant change.  Original Report Authenticated By: Natasha Mead, M.D.   Dg Pelvis Portable  05/08/2012  *RADIOLOGY  REPORT*  Clinical Data: Postop left total hip arthroplasty.  PORTABLE PELVIS  Comparison: None.  Findings: Left total hip arthroplasty.  Components appear well seated.  Subcutaneous air is seen in the surrounding soft tissues. Mild degenerative changes in the right hip.  IMPRESSION: Left hip arthroplasty with expected postoperative findings.  Original Report Authenticated By: Reyes Ivan, M.D.   Dg Hip Portable 1 View Left  05/08/2012  *RADIOLOGY REPORT*  Clinical Data: Left total hip arthroplasty.  PORTABLE LEFT HIP - 1 VIEW  Comparison: None.  Findings: Cross-table lateral view of the left hip arthroplasty is submitted.  Femoral stem appears well seated.  Body habitus otherwise obscures the left hip.  IMPRESSION: Left hip arthroplasty, as above.  Original Report Authenticated By: Reyes Ivan, M.D.    Disposition:   Discharge Orders    Future Appointments: Provider: Department: Dept Phone: Center:   07/11/2012 1:00 PM [facility] [phone] [facility]   07/11/2012 2:00 PM Pryor Ochoa, MD [facility] [phone] [facility]     Future Orders Please Complete By Expires   Increase activity slowly      [facility]       May shower / Bathe      Driving Restrictions      Comments:   No driving for 2 weeks.   Change dressing (specify)      Comments:   Dressing change as needed.   Call MD for:  temperature >100.4      Call MD for:  severe uncontrolled pain      Call MD for:  redness, tenderness, or signs of infection (pain, swelling, redness, odor or green/yellow discharge around incision site)      Discharge instructions      Comments:   F/U with Dr. Turner Daniels as scheduled.         SignedHazle Nordmann. 05/10/2012, 8:05 AM

## 2012-05-10 NOTE — Progress Notes (Signed)
Physical Therapy Treatment Patient Details Name: Kathleen Wiley MRN: [id number] DOB: October 09, 1946 Today's Date: 05/10/2012 Time: 4098-1191 PT Time Calculation (min): 33 min  PT Assessment / Plan / Recommendation Comments on Treatment Session  Pt admitted s/p left THA and continues to progress.  Still limited with activity tolerance.  However, pt able to increase ambulation distance steadily.  Continue to recommend SNF as safest d/c option.    Follow Up Recommendations  Skilled nursing facility    Barriers to Discharge        Equipment Recommendations  Defer to next venue    Recommendations for Other Services    Frequency 7X/week   Plan Discharge plan remains appropriate;Frequency remains appropriate    Precautions / Restrictions Precautions Precautions: Posterior Hip Precaution Booklet Issued: No Precaution Comments: Pt able to recall 3/3 posterior hip precautions. Restrictions Weight Bearing Restrictions: Yes LLE Weight Bearing: Weight bearing as tolerated   Pertinent Vitals/Pain 3/10 in left hip.  Pt repositioned.    Mobility  Bed Mobility Bed Mobility: Not assessed Supine to Sit: 4: Min assist Details for Bed Mobility Assistance: Assist for left LE due to pain with cues for sequence. Transfers Transfers: Sit to Stand;Stand to Sit (2 trials.) Sit to Stand: 4: Min guard;With upper extremity assist;From chair/3-in-1 Stand to Sit: 4: Min guard;With upper extremity assist;To chair/3-in-1 Details for Transfer Assistance: Guarding for balance with cues for safest hand/left LE placement. Ambulation/Gait Ambulation/Gait Assistance: 4: Min guard Ambulation Distance (Feet): 25 Feet (15 and 10 feet.) Assistive device: Rolling walker Ambulation/Gait Assistance Details: Guarding for balance with pt able to advance left LE without assistance this pm.  Cues for sequence, tall posture, and safety inside RW. Gait Pattern: Step-to pattern;Decreased step length - left;Decreased stance  time - left;Trunk flexed;Shuffle Gait velocity: Very slowed. Stairs: No Wheelchair Mobility Wheelchair Mobility: No    Exercises Total Joint Exercises Ankle Circles/Pumps: AROM;Left;10 reps;Supine Quad Sets: AROM;Left;10 reps;Supine Heel Slides: AROM;Left;10 reps;Supine   PT Diagnosis:    PT Problem List:   PT Treatment Interventions:     PT Goals Acute Rehab PT Goals PT Goal Formulation: With patient Time For Goal Achievement: 05/16/12 Potential to Achieve Goals: Fair PT Goal: Supine/Side to Sit - Progress: Progressing toward goal PT Goal: Sit to Stand - Progress: Progressing toward goal PT Goal: Stand to Sit - Progress: Progressing toward goal PT Goal: Ambulate - Progress: Progressing toward goal PT Goal: Perform Home Exercise Program - Progress: Progressing toward goal  Visit Information  Last PT Received On: 05/10/12 Assistance Needed: +1    Subjective Data  Subjective: "Oh no.  You're back?!?" Patient Stated Goal: Go home.   Cognition  Overall Cognitive Status: Appears within functional limits for tasks assessed/performed Arousal/Alertness: Awake/alert Orientation Level: Appears intact for tasks assessed Behavior During Session: [facility] for tasks performed    Balance  Balance Balance Assessed: No  End of Session PT - End of Session Equipment Utilized During Treatment: Gait belt Activity Tolerance: Patient tolerated treatment well Patient left: in chair;with call bell/phone within reach Nurse Communication: Mobility status   GP     Cephus Shelling 05/10/2012, 2:51 PM  05/10/2012 Cephus Shelling, PT, DPT [phone]

## 2012-05-10 NOTE — Progress Notes (Signed)
PATIENT ID: Kathleen Wiley  MRN: [id number]  DOB/AGE:  Jul 18, 1946 / 66 y.o.  2 Days Post-Op Procedure(s) (LRB): TOTAL HIP ARTHROPLASTY (Left)    PROGRESS NOTE Subjective: Patient is alert, oriented,no Nausea, no Vomiting, yes passing gas, no Bowel Movement. Taking PO well. Denies SOB, Chest or Calf Pain. Using Incentive Spirometer, PAS in place. Ambulating slowly with PT. Patient reports pain as moderate  .    Objective: Vital signs in last 24 hours: Filed Vitals:   05/09/12 1433 05/09/12 1600 05/09/12 2205 05/10/12 0552  BP: 126/71  109/54 112/60  Pulse: 84  83 101  Temp: 98.7 F (37.1 C)  100.6 F (38.1 C) 99.9 F (37.7 C)  TempSrc:   Oral Oral  Resp: 18 17 18 18   SpO2: 96% 99% 95% 97%      Intake/Output from previous day: I/O last 3 completed shifts: In: 2220 [P.O.:720; I.V.:1500] Out: 2625 [Urine:2625]   Intake/Output this shift:     LABORATORY DATA:  Basename 05/10/12 0500 05/09/12 0408  WBC 7.0 5.9  HGB 10.5* 10.2*  HCT 30.6* 30.2*  PLT 198 200  NA -- 134*  K -- 3.7  CL -- 100  CO2 -- 25  BUN -- 16  CREATININE -- 0.64  GLUCOSE -- 154*  GLUCAP -- --  INR -- --  CALCIUM -- 7.9*    Examination: Neurologically intact ABD soft Neurovascular intact Sensation intact distally Intact pulses distally Dorsiflexion/Plantar flexion intact Incision: scant drainage} XR AP&Lat of hip shows well placed\fixed THA  Assessment:   2 Days Post-Op Procedure(s) (LRB): TOTAL HIP ARTHROPLASTY (Left) ADDITIONAL DIAGNOSIS:  none  Plan: PT/OT WBAT, THA  posterior precautions  DVT Prophylaxis: SCDx72 hrs, ASA 325 mg BID x 2 weeks  DISCHARGE PLAN: Home Thursday  DISCHARGE NEEDS: HHPT, HHRN, Walker and 3-in-1 comode seat

## 2012-05-10 NOTE — Progress Notes (Signed)
Patient referred to CSW by Physical Therapy for possible short term SNF placement. Met with patient this evening. She states that yesterday and this morning she was having significant pain and felt that SNF may be indicated but this afternoon and evening she has "done much better" and her pain levels have dimished. She feels that she progressed with PT and now wants to consider returning home with home health. Her husband is off the rest of the week and her daughter-in-law will be coming to stay with her next week. She states that she already has a hospital bed and an over the toilet seat at home.  Her walker does not have wheels.  Patient will "think" about choices tonight and talk to her family; CSW will revisit in the a.m.  Should she desire SNF placement- will proceed with bed search.  Patient appears to be wanting to return home. She is also concerned about cost of SNF placement ($50.00 /day) and wishes to talk to her husband.    Lorri Frederick. [place]  [phone]

## 2012-05-10 NOTE — Progress Notes (Signed)
Physical Therapy Treatment Patient Details Name: Kathleen Wiley MRN: [id number] DOB: 07/14/1946 Today's Date: 05/10/2012 Time: 6295-2841 PT Time Calculation (min): 30 min  PT Assessment / Plan / Recommendation Comments on Treatment Session  Pt admitted s/p left THA and is very limited by pain causing extremely decreased activity tolerance.  Pt constantly feeling like she will "pass out from the pain" even with encouragement from therapist.  D/w pt possibility of d/c to SNF for continued therapy prior to d/c home.  Pt in agreement.  Spoke with SW and CM about recommendations.    Follow Up Recommendations  Skilled nursing facility    Barriers to Discharge        Equipment Recommendations  Defer to next venue    Recommendations for Other Services    Frequency 7X/week   Plan Discharge plan needs to be updated;Frequency remains appropriate    Precautions / Restrictions Precautions Precautions: Posterior Hip Precaution Booklet Issued: No Precaution Comments: Pt able to recall 3/3 posterior hip precautions. Restrictions Weight Bearing Restrictions: Yes LLE Weight Bearing: Weight bearing as tolerated   Pertinent Vitals/Pain 7/10 in left hip.  Pt repositioned.    Mobility  Bed Mobility Bed Mobility: Supine to Sit Supine to Sit: 4: Min assist Details for Bed Mobility Assistance: Assist for left LE due to pain with cues for sequence. Transfers Transfers: Sit to Stand;Stand to Sit (2 trials.) Sit to Stand: 4: Min guard;With upper extremity assist;From bed;From chair/3-in-1 Stand to Sit: 4: Min guard;With upper extremity assist;To chair/3-in-1 Details for Transfer Assistance: Guarding for balance with cues for safest hand/left LE placement. Ambulation/Gait Ambulation/Gait Assistance: 4: Min assist Ambulation Distance (Feet): 4 Feet (2 feet x 2 trials.) Assistive device: Rolling walker Ambulation/Gait Assistance Details: Assist to advance left LE due to pain with cues for tall posture  and max encouragement to continue.  Pt with constant feeling as though she is going to pass out "from the pain."  Gait Pattern: Step-to pattern;Decreased step length - left;Decreased stance time - left;Trunk flexed;Shuffle Gait velocity: Very slowed. Stairs: No Wheelchair Mobility Wheelchair Mobility: No    Exercises     PT Diagnosis:    PT Problem List:   PT Treatment Interventions:     PT Goals Acute Rehab PT Goals PT Goal Formulation: With patient Time For Goal Achievement: 05/16/12 Potential to Achieve Goals: Fair PT Goal: Supine/Side to Sit - Progress: Progressing toward goal PT Goal: Sit to Stand - Progress: Progressing toward goal PT Goal: Stand to Sit - Progress: Progressing toward goal PT Goal: Ambulate - Progress: Progressing toward goal  Visit Information  Last PT Received On: 05/10/12 Assistance Needed: +1    Subjective Data  Subjective: "My leg is still really hurting." Patient Stated Goal: Go home.   Cognition  Overall Cognitive Status: Appears within functional limits for tasks assessed/performed Arousal/Alertness: Awake/alert Orientation Level: Appears intact for tasks assessed Behavior During Session: [facility] for tasks performed    Balance  Balance Balance Assessed: No  End of Session PT - End of Session Equipment Utilized During Treatment: Gait belt Activity Tolerance: Patient tolerated treatment well;Patient limited by pain Patient left: in chair;with call bell/phone within reach Nurse Communication: Mobility status   GP     Cephus Shelling 05/10/2012, 11:44 AM  05/10/2012 Cephus Shelling, PT, DPT [phone]

## 2012-05-11 LAB — CBC
HCT: 26.2 % — ABNORMAL LOW (ref 36.0–46.0)
MCH: 32.3 pg (ref 26.0–34.0)
MCV: 93.9 fL (ref 78.0–100.0)
Platelets: 204 10*3/uL (ref 150–400)
RDW: 13.5 % (ref 11.5–15.5)

## 2012-05-11 NOTE — Progress Notes (Signed)
Physical Therapy Treatment Patient Details Name: Kathleen Wiley MRN: [id number] DOB: Jun 22, 1946 Today's Date: 05/11/2012 Time: 3086-5784 PT Time Calculation (min): 34 min  PT Assessment / Plan / Recommendation Comments on Treatment Session  Pt admitted s/p left THA and has improved significantly with mobility in the last 24 hours.  Pt very motivated and able to increase ambulation distance and independence.  Pt reports she wants to d/c home.  Will trial stairs next treatment.    Follow Up Recommendations  Home health PT    Barriers to Discharge        Equipment Recommendations  Rolling walker with 5" wheels    Recommendations for Other Services    Frequency 7X/week   Plan Discharge plan needs to be updated;Frequency remains appropriate    Precautions / Restrictions Precautions Precautions: Posterior Hip Precaution Booklet Issued: No Precaution Comments: Pt able to recall 3/3 posterior hip precautions. Restrictions Weight Bearing Restrictions: Yes LLE Weight Bearing: Weight bearing as tolerated   Pertinent Vitals/Pain 4/10 in left hip.  Pt repositioned.    Mobility  Bed Mobility Bed Mobility: Not assessed Transfers Transfers: Sit to Stand;Stand to Sit Sit to Stand: 5: Supervision;With upper extremity assist;From chair/3-in-1 Stand to Sit: 5: Supervision;With upper extremity assist;To chair/3-in-1 Details for Transfer Assistance: Verbal cues for safest hand/left LE placement. Ambulation/Gait Ambulation/Gait Assistance: 5: Supervision Ambulation Distance (Feet): 220 Feet Assistive device: Rolling walker Ambulation/Gait Assistance Details: Verbal cues for tall posture and initial contact on left heel with encouragement to increase distance as well as attempt step-through sequence. Gait Pattern: Step-to pattern;Decreased step length - left;Decreased stance time - left;Trunk flexed Gait velocity: Very slowed. Stairs: No Wheelchair Mobility Wheelchair Mobility: No      Exercises Total Joint Exercises Ankle Circles/Pumps: AROM;Left;10 reps;Supine Quad Sets: AROM;Left;10 reps;Supine Heel Slides: AROM;Left;10 reps;Supine   PT Diagnosis:    PT Problem List:   PT Treatment Interventions:     PT Goals Acute Rehab PT Goals PT Goal Formulation: With patient Time For Goal Achievement: 05/16/12 Potential to Achieve Goals: Fair PT Goal: Sit to Stand - Progress: Met PT Goal: Stand to Sit - Progress: Met PT Goal: Ambulate - Progress: Met PT Goal: Perform Home Exercise Program - Progress: Progressing toward goal  Visit Information  Last PT Received On: 05/11/12 Assistance Needed: +1    Subjective Data  Subjective: "I feel like I am doing much better." Patient Stated Goal: Go home.   Cognition  Overall Cognitive Status: Appears within functional limits for tasks assessed/performed Arousal/Alertness: Awake/alert Orientation Level: Appears intact for tasks assessed Behavior During Session: [facility] for tasks performed    Balance  Balance Balance Assessed: No  End of Session PT - End of Session Equipment Utilized During Treatment: Gait belt Activity Tolerance: Patient tolerated treatment well Patient left: in chair;with call bell/phone within reach Nurse Communication: Mobility status   GP     Cephus Shelling 05/11/2012, 10:39 AM  05/11/2012 Cephus Shelling, PT, DPT [phone]

## 2012-05-11 NOTE — Progress Notes (Signed)
PT Treatment Note:   05/11/12 1426  PT Visit Information  Last PT Received On 05/11/12  Assistance Needed +1  PT Time Calculation  PT Start Time 1327  PT Stop Time 1350  PT Time Calculation (min) 23 min  Subjective Data  Subjective "I get to go home."  Patient Stated Goal Go home.  Precautions  Precautions Posterior Hip  Precaution Booklet Issued No  Precaution Comments Pt able to recall 3/3 posterior hip precautions.  Restrictions  Weight Bearing Restrictions Yes  LLE Weight Bearing WBAT  Cognition  Overall Cognitive Status Appears within functional limits for tasks assessed/performed  Arousal/Alertness Awake/alert  Orientation Level Appears intact for tasks assessed  Behavior During Session [facility] for tasks performed  Bed Mobility  Bed Mobility Not assessed  Transfers  Transfers Sit to Stand;Stand to Sit  Sit to Stand 6: Modified independent (Device/Increase time);With upper extremity assist;From chair/3-in-1  Stand to Sit 6: Modified independent (Device/Increase time);With upper extremity assist;To chair/3-in-1  Ambulation/Gait  Ambulation/Gait Assistance 5: Supervision  Ambulation Distance (Feet) 160 Feet  Assistive device Rolling walker  Ambulation/Gait Assistance Details Verbal cues for tall posture and attempt at step-through sequence.  Gait Pattern Step-to pattern;Decreased step length - left;Decreased stance time - left;Trunk flexed  Stairs Yes  Stairs Assistance 4: Min assist  Stairs Assistance Details (indicate cue type and reason) Assist to steady RW during backwards sequence with RW as well as cues for "up with good, down with bad."  Stair Management Technique Step to pattern;Backwards;With walker  Number of Stairs 2   Wheelchair Mobility  Wheelchair Mobility No  Balance  Balance Assessed No  PT - End of Session  Equipment Utilized During Treatment Gait belt  Activity Tolerance Patient tolerated treatment well  Patient left in chair;with call bell/phone  within reach  Nurse Communication Mobility status  PT - Assessment/Plan  Comments on Treatment Session Pt admitted s/p left THA and is ready for safe d/c home once medically cleared by MD.  Pt able to tolerate stair negotiation this pm and continues to improve quality of ambulation.  PT Plan Discharge plan needs to be updated;Frequency remains appropriate  PT Frequency 7X/week  Follow Up Recommendations Home health PT  Equipment Recommended Rolling walker with 5" wheels  Acute Rehab PT Goals  PT Goal Formulation With patient  Time For Goal Achievement 05/16/12  Potential to Achieve Goals Fair  PT Goal: Sit to Stand - Progress Met  PT Goal: Stand to Sit - Progress Met  PT Goal: Ambulate - Progress Met  PT Goal: Up/Down Stairs - Progress Progressing toward goal  PT General Charges  $$ ACUTE PT VISIT 1 Procedure  PT Treatments  $Gait Training 23-37 mins    Pain:  2/10 in left hip.  Pt repositioned.  05/11/2012 Cephus Shelling, PT, DPT [phone]

## 2012-05-11 NOTE — Progress Notes (Signed)
RW and 3-in-1 arranged with [facility] for pt's discharge today.

## 2012-05-11 NOTE — Progress Notes (Signed)
Met with patient this a.m.  She stated that she feels she progressing now and is feeling so much better. She has elected to return home at d/c with HH/DME.  Patient is aware of SNF option but defers. She feels she will have adequate support at home.  No further CSW needs identified.  Will sign off.   Lorri Frederick. [place]  [phone]

## 2012-05-11 NOTE — Progress Notes (Signed)
Occupational Therapy Treatment Patient Details Name: Kathleen Wiley MRN: [id number] DOB: 1946/07/20 Today's Date: 05/11/2012 Time: 0912-0930 OT Time Calculation (min): 18 min  OT Assessment / Plan / Recommendation Comments on Treatment Session This patient making great progress since yesterday, now feels that she can go home with family assist and HH.    Follow Up Recommendations  Home health OT    Barriers to Discharge       Equipment Recommendations  Rolling walker with 5" wheels    Recommendations for Other Services    Frequency Min 2X/week   Plan Discharge plan needs to be updated    Precautions / Restrictions Precautions Precautions: Posterior Hip Restrictions Weight Bearing Restrictions: Yes LLE Weight Bearing: Weight bearing as tolerated   Pertinent Vitals/Pain     ADL  Grooming: Performed;Teeth care;Set up Where Assessed - Grooming: Unsupported standing Toilet Transfer: Buyer, retail Method: Sit to Barista:  (Recliner to sink back to recliner) Equipment Used: Rolling walker Transfers/Ambulation Related to ADLs: Supervison ADL Comments: Went over with patient how to be able to reach items on lower shelf and still follow hip precautions. Went over how she can use RW as a cart to carry laundry and clean clothes. Also told her that a nail apron works well for a bag for the front of the RW.    OT Diagnosis:    OT Problem List:   OT Treatment Interventions:     OT Goals ADL Goals ADL Goal: Grooming - Progress: Met  Visit Information  Last OT Received On: 05/11/12 Assistance Needed: +1    Subjective Data  Subjective: I feel so much better today   Prior Functioning       Cognition  Overall Cognitive Status: Appears within functional limits for tasks assessed/performed Arousal/Alertness: Awake/alert Orientation Level: Appears intact for tasks assessed Behavior During Session: [facility] for tasks performed    Mobility Transfers Transfers: Sit to Stand;Stand to Sit Sit to Stand: 5: Supervision;With upper extremity assist;With armrests;From chair/3-in-1 Stand to Sit: 5: Supervision;With upper extremity assist;With armrests;To chair/3-in-1   Exercises    Balance    End of Session OT - End of Session Equipment Utilized During Treatment:  (None) Activity Tolerance: Patient tolerated treatment well Patient left: in chair;with call bell/phone within reach  GO     Evette Georges [phone] 05/11/2012, 9:46 AM

## 2012-05-11 NOTE — Progress Notes (Signed)
PATIENT ID: Kathleen Wiley   3 Days Post-Op Procedure(s) (LRB): TOTAL HIP ARTHROPLASTY (Left)  Subjective: The patient is doing very well and is either to go home. She has done well with physical therapy and is planning on doing the stairs today. She has still not had a bowel movement yet.  Objective:  Filed Vitals:   05/11/12 0535  BP: 113/45  Pulse: 84  Temp: 98.2 F (36.8 C)  Resp: 18     Her dressing is clean dry and intact. No calf tenderness or swelling. She wiggles her toes and ankles without difficulty. Distally she is neurovascularly intact.  Labs:   [facility] 05/11/12 0505 05/10/12 0500 05/09/12 0408  HGB 9.0* 10.5* 10.2*   Basename 05/11/12 0505 05/10/12 0500  WBC 5.7 7.0  RBC 2.79* 3.21*  HCT 26.2* 30.6*  PLT 204 198   Basename 05/09/12 0408  NA 134*  K 3.7  CL 100  CO2 25  BUN 16  CREATININE 0.64  GLUCOSE 154*  CALCIUM 7.9*    Assessment and Plan: Doing well, ready for discharge  She will do stairs with physical therapy today. She will work on having a bowel movement before discharge. Plan will be for discharge today after physical therapy. She will followup in 2 weeks with Dr. Turner Daniels. VTE proph: Enteric-coated aspirin twice daily for 2 weeks.

## 2012-05-26 ENCOUNTER — Other Ambulatory Visit (HOSPITAL_COMMUNITY)
Admission: RE | Admit: 2012-05-26 | Discharge: 2012-05-26 | Disposition: A | Discharge status: Home / Self Care | Payer: Medicare Other | Source: Ambulatory Visit | Attending: Family Medicine | Admitting: Family Medicine

## 2012-05-26 ENCOUNTER — Other Ambulatory Visit: Payer: Self-pay | Admitting: Family Medicine

## 2012-05-26 DIAGNOSIS — Z124 Encounter for screening for malignant neoplasm of cervix: Secondary | ICD-10-CM | POA: Insufficient documentation

## 2012-05-26 DIAGNOSIS — Z1151 Encounter for screening for human papillomavirus (HPV): Secondary | ICD-10-CM | POA: Insufficient documentation

## 2012-05-26 DIAGNOSIS — N760 Acute vaginitis: Secondary | ICD-10-CM | POA: Insufficient documentation

## 2012-07-07 ENCOUNTER — Encounter: Payer: Self-pay | Admitting: Vascular Surgery

## 2012-07-11 ENCOUNTER — Encounter: Payer: Medicare Other | Admitting: Vascular Surgery

## 2012-08-03 ENCOUNTER — Other Ambulatory Visit: Payer: Self-pay | Admitting: Family Medicine

## 2012-08-03 DIAGNOSIS — Z1231 Encounter for screening mammogram for malignant neoplasm of breast: Secondary | ICD-10-CM

## 2012-08-06 ENCOUNTER — Emergency Department (HOSPITAL_COMMUNITY)
Admission: EM | Admit: 2012-08-06 | Discharge: 2012-08-07 | Disposition: A | Discharge status: Home / Self Care | Payer: Medicare Other | Attending: Emergency Medicine | Admitting: Emergency Medicine

## 2012-08-06 ENCOUNTER — Encounter (HOSPITAL_COMMUNITY): Payer: Self-pay | Admitting: Physical Medicine and Rehabilitation

## 2012-08-06 ENCOUNTER — Emergency Department (HOSPITAL_COMMUNITY): Payer: Medicare Other

## 2012-08-06 DIAGNOSIS — Z96649 Presence of unspecified artificial hip joint: Secondary | ICD-10-CM | POA: Insufficient documentation

## 2012-08-06 DIAGNOSIS — K219 Gastro-esophageal reflux disease without esophagitis: Secondary | ICD-10-CM | POA: Insufficient documentation

## 2012-08-06 DIAGNOSIS — M549 Dorsalgia, unspecified: Secondary | ICD-10-CM | POA: Insufficient documentation

## 2012-08-06 DIAGNOSIS — Z79899 Other long term (current) drug therapy: Secondary | ICD-10-CM | POA: Insufficient documentation

## 2012-08-06 DIAGNOSIS — R51 Headache: Secondary | ICD-10-CM | POA: Insufficient documentation

## 2012-08-06 DIAGNOSIS — R112 Nausea with vomiting, unspecified: Secondary | ICD-10-CM | POA: Insufficient documentation

## 2012-08-06 DIAGNOSIS — I10 Essential (primary) hypertension: Secondary | ICD-10-CM | POA: Insufficient documentation

## 2012-08-06 DIAGNOSIS — H5710 Ocular pain, unspecified eye: Secondary | ICD-10-CM | POA: Insufficient documentation

## 2012-08-06 DIAGNOSIS — E7800 Pure hypercholesterolemia, unspecified: Secondary | ICD-10-CM | POA: Insufficient documentation

## 2012-08-06 DIAGNOSIS — N390 Urinary tract infection, site not specified: Secondary | ICD-10-CM | POA: Insufficient documentation

## 2012-08-06 DIAGNOSIS — R079 Chest pain, unspecified: Secondary | ICD-10-CM | POA: Insufficient documentation

## 2012-08-06 DIAGNOSIS — M129 Arthropathy, unspecified: Secondary | ICD-10-CM | POA: Insufficient documentation

## 2012-08-06 LAB — BASIC METABOLIC PANEL
BUN: 16 mg/dL (ref 6–23)
Calcium: 9.5 mg/dL (ref 8.4–10.5)
Creatinine, Ser: 0.69 mg/dL (ref 0.50–1.10)
GFR calc Af Amer: >90 mL/min (ref >90)
GFR calc non Af Amer: 89 mL/min — ABNORMAL LOW (ref >90)
Glucose, Bld: 117 mg/dL — ABNORMAL HIGH (ref 70–99)
Potassium: 3.3 mEq/L — ABNORMAL LOW (ref 3.5–5.1)

## 2012-08-06 LAB — URINE MICROSCOPIC-ADD ON

## 2012-08-06 LAB — CBC WITH DIFFERENTIAL/PLATELET
Basophils Relative: 1 % (ref 0–1)
Eosinophils Absolute: 0.1 10*3/uL (ref 0.0–0.7)
Eosinophils Relative: 1 % (ref 0–5)
Hemoglobin: 13.5 g/dL (ref 12.0–15.0)
MCH: 31.7 pg (ref 26.0–34.0)
MCHC: 34.4 g/dL (ref 30.0–36.0)
MCV: 92.3 fL (ref 78.0–100.0)
Monocytes Relative: 7 % (ref 3–12)
Neutrophils Relative %: 63 % (ref 43–77)

## 2012-08-06 LAB — URINALYSIS, ROUTINE W REFLEX MICROSCOPIC
Glucose, UA: NEGATIVE mg/dL
Hgb urine dipstick: NEGATIVE
Ketones, ur: 15 mg/dL — AB
Protein, ur: NEGATIVE mg/dL
Urobilinogen, UA: 1 mg/dL (ref 0.0–1.0)

## 2012-08-06 IMAGING — CR DG CHEST 2V
2 series · 2 of 2 positions shown · non-contrast
Comparison: PA and lateral chest [DATE] and [DATE].

CLINICAL DATA: Chest and back pain.  Nausea.

CHEST - 2 VIEW

[w chest lat]
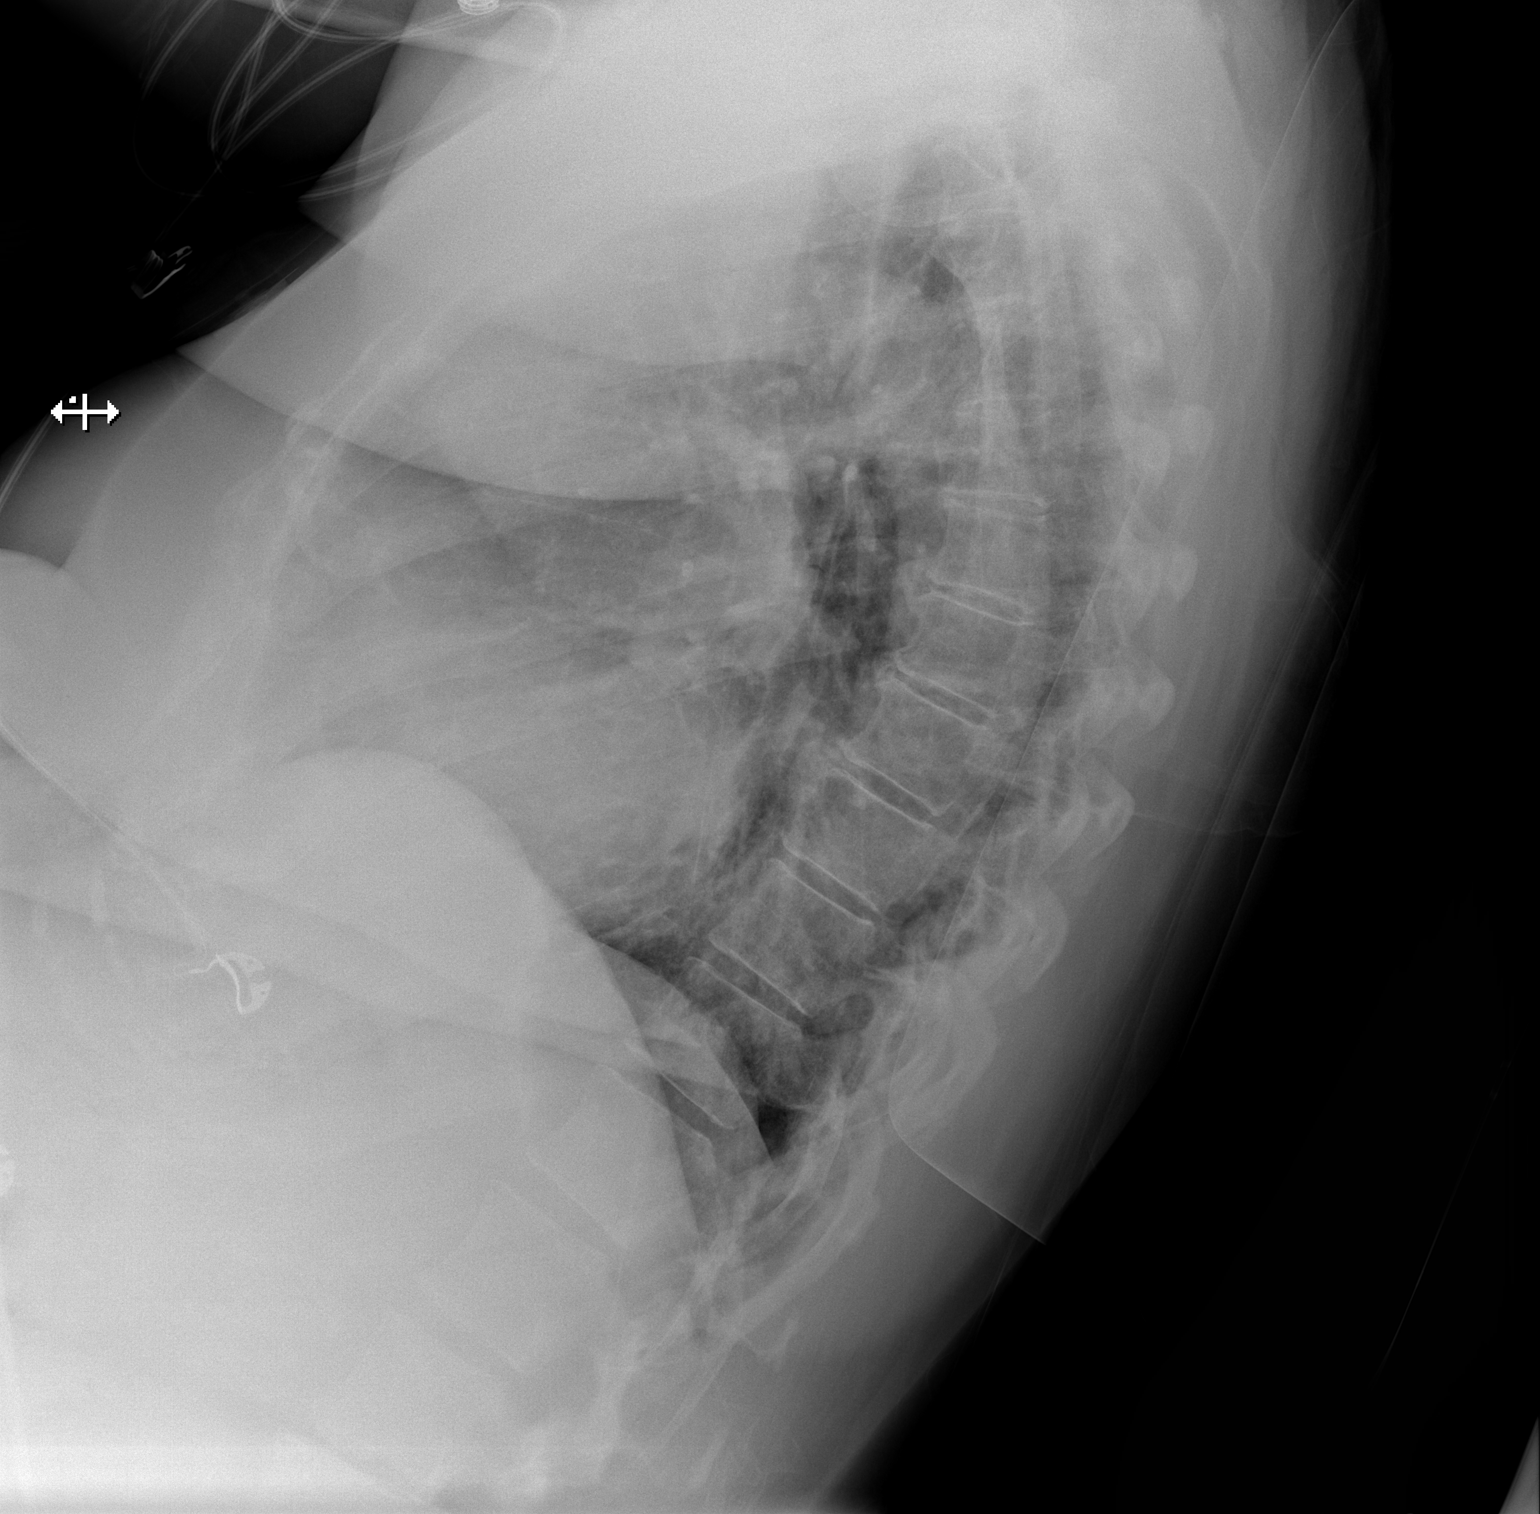

[x chest ap]
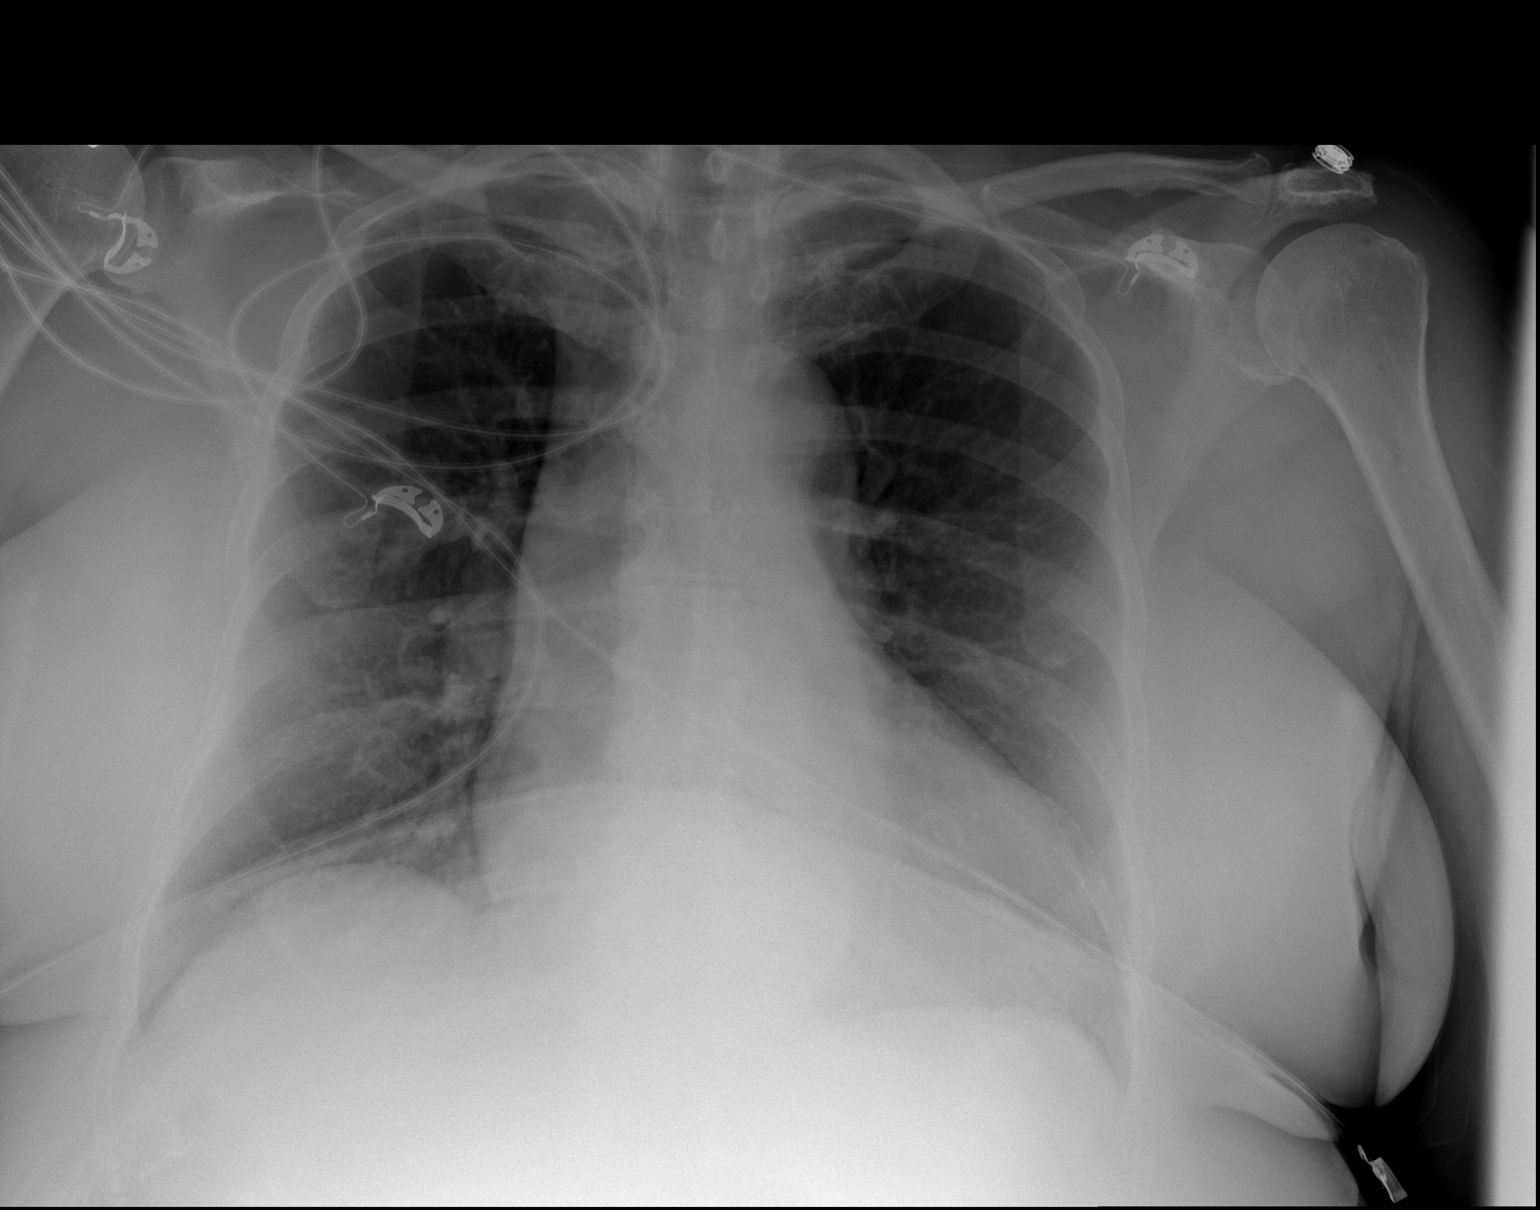

[2 of 2 positions shown; findings below may reference images not displayed]

FINDINGS: There is cardiomegaly without edema.  Lungs are clear.
No pneumothorax or pleural effusion.
IMPRESSION: Cardiomegaly without acute disease.

## 2012-08-06 MED ORDER — CEPHALEXIN 500 MG PO CAPS: 500.0000 mg | ORAL_CAPSULE | Freq: Two times a day (BID) | ORAL | Status: DC

## 2012-08-06 MED ORDER — POTASSIUM CHLORIDE 20 MEQ/15ML (10%) PO LIQD
20.0000 meq | Freq: Once | ORAL | Status: AC
  Administered 2012-08-06: 20 meq via ORAL
  Filled 2012-08-06: qty 15

## 2012-08-06 MED ORDER — SODIUM CHLORIDE 0.9 % IV BOLUS (SEPSIS)
1000.0000 mL | Freq: Once | INTRAVENOUS | Status: AC
  Administered 2012-08-06: 1000 mL via INTRAVENOUS

## 2012-08-06 MED ORDER — KETOROLAC TROMETHAMINE 30 MG/ML IJ SOLN
30.0000 mg | Freq: Four times a day (QID) | INTRAMUSCULAR | Status: DC
  Administered 2012-08-06: 30 mg via INTRAVENOUS
  Filled 2012-08-06: qty 1

## 2012-08-06 MED ORDER — HYDROCODONE-ACETAMINOPHEN 5-325 MG PO TABS
1.0000 | ORAL_TABLET | Freq: Once | ORAL | Status: AC
  Administered 2012-08-06: 1 via ORAL
  Filled 2012-08-06: qty 1

## 2012-08-06 MED ORDER — CEPHALEXIN 250 MG PO CAPS
500.0000 mg | ORAL_CAPSULE | Freq: Once | ORAL | Status: AC
  Administered 2012-08-06: 500 mg via ORAL
  Filled 2012-08-06: qty 2

## 2012-08-06 MED ORDER — METOCLOPRAMIDE HCL 5 MG/ML IJ SOLN
10.0000 mg | Freq: Once | INTRAMUSCULAR | Status: AC
  Administered 2012-08-06: 10 mg via INTRAVENOUS
  Filled 2012-08-06: qty 2

## 2012-08-06 MED ORDER — ONDANSETRON 4 MG PO TBDP
4.0000 mg | ORAL_TABLET | Freq: Once | ORAL | Status: AC
  Administered 2012-08-06: 4 mg via ORAL
  Filled 2012-08-06: qty 1

## 2012-08-06 MED ORDER — DEXAMETHASONE SODIUM PHOSPHATE 10 MG/ML IJ SOLN
10.0000 mg | Freq: Once | INTRAMUSCULAR | Status: AC
  Administered 2012-08-06: 10 mg via INTRAVENOUS
  Filled 2012-08-06: qty 1

## 2012-08-06 NOTE — ED Provider Notes (Signed)
History     CSN: [id number]  Arrival date & time 08/06/12  1715   First MD Initiated Contact with Patient 08/06/12 1926      Chief Complaint  Patient presents with  . Back Pain  . Chest Pain  . Headache    HPI Kathleen Wiley is a 66 yo woman with PMH of HTN, GERD, hx of migraines, arthritis s/p left hip replacement on 7/13, who presents to the [facility] ED for evaluation of back pain, chest pain, and nausea. She reports that on Thursday she started feeling left subscapular back pain, she went to see her PCP who prescribed tramadol and for the pain but she had no improvement of her pain. She also took Vicodin for the pain with no change in her pain. On Friday she also started feeling pain on her left chest, localized to her lower left sternal border, in addition to  headache and nausea. The headache is described as occipital, with throbbing sensation at times and orbital pain, and is currently 5/10. Her nausea started after the chest pain, she had one non-bloody emesis and has had decreased PO intake.   She denies history of back injury or fall, she has not exerted herself recently. She denies calf tenderness, pain, swelling, or recent immobilization. She has no history of DVT.  She denies constipation or diarrhea but has had increased gas with some abdominal discomfort.   Past Medical History  Diagnosis Date  . GERD (gastroesophageal reflux disease)   . Hypercholesteremia   . History of atrial fibrillation 2007    with flagyl  . Hypertension     has not been to a cardiologist  . Arthritis     knee, hip  . History of endometriosis     Past Surgical History  Procedure Date  . Exploratory laparotomy 1973    for endometrosis  . Mandible surgery 1978  . Rectovaginal fistula repair 1980  . Total hip arthroplasty 05/08/2012    Procedure: TOTAL HIP ARTHROPLASTY;  Surgeon: [name], MD;  Location: [facility] OR;  Service: Orthopedics;  Laterality: Left;    No family history on file.  History    Substance Use Topics  . Smoking status: Former Smoker -- 1.0 packs/day for 10 years    Quit date: 11/08/1981  . Smokeless tobacco: Not on file  . Alcohol Use: No    OB History    Grav Para Term Preterm Abortions TAB SAB Ect Mult Living                  Review of Systems  Constitutional: Negative for fever, chills, activity change, appetite change and fatigue.  HENT: Negative for sore throat, neck pain and neck stiffness.   Eyes: Negative for photophobia and visual disturbance.  Respiratory: Negative for cough, chest tightness and shortness of breath.   Cardiovascular: Positive for chest pain. Negative for palpitations.  Gastrointestinal: Positive for nausea, vomiting and abdominal pain. Negative for diarrhea, constipation, blood in stool and abdominal distention.  Genitourinary: Negative for dysuria and flank pain.  Musculoskeletal: Positive for back pain.  Skin: Negative for rash and wound.  Neurological: Negative for dizziness, weakness, numbness and headaches.  Psychiatric/Behavioral: Negative for behavioral problems.    Allergies  Other; Metronidazole; and Septra  Home Medications   Current Outpatient Rx  Name Route Sig Dispense Refill  . GABAPENTIN 100 MG PO CAPS Oral Take 100 mg by mouth at bedtime.    Marland Kitchen GEMFIBROZIL 600 MG PO TABS Oral Take  600 mg by mouth 2 (two) times daily before a meal.    . HYDROCODONE-ACETAMINOPHEN 5-500 MG PO TABS Oral Take 1 tablet by mouth every 6 (six) hours as needed. For pain    . OMEPRAZOLE 20 MG PO CPDR Oral Take 20 mg by mouth daily.    . TRAMADOL HCL 50 MG PO TABS Oral Take 50 mg by mouth every 6 (six) hours as needed. For pain    . TRIAMTERENE-HCTZ 75-50 MG PO TABS Oral Take 1 tablet by mouth daily.      BP 135/74  Pulse 77  Temp 98.3 F (36.8 C) (Oral)  Resp 12  SpO2 97%  Physical Exam  Nursing note and vitals reviewed. Constitutional: She is oriented to person, place, and time. She appears well-developed. No distress.   HENT:  Head: Atraumatic.  Eyes: Conjunctivae normal are normal. No scleral icterus.  Neck: Neck supple. No tracheal deviation present.  Cardiovascular: Normal rate, regular rhythm, normal heart sounds and intact distal pulses.   Pulmonary/Chest: Effort normal and breath sounds normal. No respiratory distress. She has no wheezes. She has no rales. She exhibits no tenderness.  Abdominal: Soft. Bowel sounds are normal. She exhibits distension. She exhibits no mass. There is no tenderness. There is no rebound and no guarding.       Obese  Genitourinary:       No CVA tenderness  Musculoskeletal: She exhibits tenderness. She exhibits no edema.       Point tenderness at lower left sternal border that is not worse with movement.  Point tenderness at  left paraspinal subscapular area that is not worse with movement.  No deformities.    Neurological: She is alert and oriented to person, place, and time.       UE 5/5  Skin: Skin is warm and dry. No rash noted. She is not diaphoretic. No erythema. No pallor.       Well-healed, lateral, surgical scar over left hip  Psychiatric: She has a normal mood and affect. Her behavior is normal.    ED Course  Procedures (including critical care time)  Date: 08/06/2012  Rate: 83  Rhythm: normal sinus rhythm  QRS Axis: left  Intervals: normal  ST/T Wave abnormalities: nonspecific T wave changes  Conduction Disutrbances:right bundle branch block and , incomplete  Narrative Interpretation:   Old EKG Reviewed: unchanged   08/06/2012   Labs Reviewed  CBC WITH DIFFERENTIAL  TROPONIN I  BASIC METABOLIC PANEL  D-DIMER, QUANTITATIVE  URINALYSIS, ROUTINE W REFLEX MICROSCOPIC   Dg Chest 2 View  08/06/2012  *RADIOLOGY REPORT*  Clinical Data: Chest and back pain.  Nausea.  CHEST - 2 VIEW  Comparison: PA and lateral chest 05/03/2012 and 05/12/2006.  Findings: There is cardiomegaly without edema.  Lungs are clear. No pneumothorax or pleural effusion.   IMPRESSION: Cardiomegaly without acute disease.   Original Report Authenticated By: Bernadene Bell. Maricela Curet, M.D.      No diagnosis found.    MDM  66 yo woman with PMH of HTN, GERD, arthritis s/p left hip replacement on 7/1, no with back pain x4 days and chest pain/headache/nausea x 3days. She has had no back pain relief with Vicodin or tramadol. Her modified Well's score <4.   D-Dimer negative BMET with mild hyponatremia (132), hypokalemia (3.3), likely from vomiting and decreased PO. Will replete K with oral/liquid K.  CBC unremarkable UA nitrite positive, leukocytes, many bacteria CXR 2 view showing cardiomegaly but no PNA, effusions.  Troponin negative  Her nausea improved but her headache worsened, after Norco with pain reported at 7/10. Ordered HA cocktail with Toradol, Reglan, decadron, NS IV bolus. Patient reports headache improving, pain 1/10 currently, back pain has also improved,  patient requesting to go home.    Dispo. Patient to go home with Keflex 500mg  BID for 7 days, first dose given here in ED. Patient advised to follow up with her PCP for her back pain and continue taking the pain medications already prescribed to her. Discussed cardiomegaly finding on her chest X-ray, she was advised to follow with her PCP or cardiology.         Ky Barban, MD 08/07/12 0002

## 2012-08-06 NOTE — ED Notes (Signed)
Pt stated, "I think the pain is coming from pinched nerve in my back. I was given gabapentin for it."

## 2012-08-06 NOTE — ED Notes (Signed)
Urinalysis order clicked off in error.  Pt has not yet provided sample.  Will continue to check back with patient.

## 2012-08-06 NOTE — ED Notes (Signed)
Pt presents to department for evaluation of mid back pain radiating around to front of chest, also states "throbbing" headache. Ongoing for several days. 4/10 pain at the time. Was seen by PCP last night, no relief with vicodin/tramadol. Pain increases with movement. Pt is conscious alert and oriented x4.

## 2012-08-07 NOTE — ED Provider Notes (Signed)
I saw and evaluated the patient, reviewed the resident's note and I agree with the findings and plan.  Agree with EKG interpretation if present.   Pt with atypical, reproducible chest and back pain for the last few days, normal workup here, pain resolved. ALso having headache which was improved with toradol.  Charles B. Bernette Mayers, MD 08/07/12 1610

## 2012-08-08 LAB — URINE CULTURE: Colony Count: >=100000

## 2012-08-09 ENCOUNTER — Emergency Department (HOSPITAL_COMMUNITY)
Admission: EM | Admit: 2012-08-09 | Discharge: 2012-08-09 | Disposition: A | Discharge status: Home / Self Care | Payer: Medicare Other | Attending: Emergency Medicine | Admitting: Emergency Medicine

## 2012-08-09 ENCOUNTER — Encounter (HOSPITAL_COMMUNITY): Payer: Self-pay | Admitting: *Deleted

## 2012-08-09 DIAGNOSIS — I10 Essential (primary) hypertension: Secondary | ICD-10-CM | POA: Insufficient documentation

## 2012-08-09 DIAGNOSIS — M549 Dorsalgia, unspecified: Secondary | ICD-10-CM | POA: Insufficient documentation

## 2012-08-09 DIAGNOSIS — M545 Low back pain, unspecified: Secondary | ICD-10-CM | POA: Insufficient documentation

## 2012-08-09 DIAGNOSIS — R51 Headache: Secondary | ICD-10-CM

## 2012-08-09 MED ORDER — OXYCODONE-ACETAMINOPHEN 5-325 MG PO TABS
1.0000 | ORAL_TABLET | Freq: Once | ORAL | Status: AC
  Administered 2012-08-09: 1 via ORAL
  Filled 2012-08-09: qty 1

## 2012-08-09 MED ORDER — DIAZEPAM 5 MG PO TABS: 5.0000 mg | ORAL_TABLET | Freq: Two times a day (BID) | ORAL | Status: DC

## 2012-08-09 MED ORDER — IBUPROFEN 200 MG PO TABS
400.0000 mg | ORAL_TABLET | Freq: Once | ORAL | Status: AC
  Administered 2012-08-09: 400 mg via ORAL
  Filled 2012-08-09: qty 2

## 2012-08-09 NOTE — ED Notes (Signed)
+   Urine Patient treated with Keflex-sensitive to same-chart appended per protocol MD. 

## 2012-08-09 NOTE — ED Notes (Signed)
"  feels better standing and walking", using cane, declines w/c.

## 2012-08-09 NOTE — ED Provider Notes (Signed)
History     CSN: [id number]  Arrival date & time 08/09/12  0015   First MD Initiated Contact with Patient 08/09/12 0201      Chief Complaint  Patient presents with  . Back Pain    (Consider location/radiation/quality/duration/timing/severity/associated sxs/prior treatment) HPI History provided by pt.   Pt presents w/ severe, diffuse low back pain since yesterday.  Was seen by her orthopedist, is unsure of her diagnosis but was discharged home w/ percocet and robaxin, referral for PT that is scheduled for later this month and instruction to return for f/u in 2 weeks.  Had relief w/ pain medications initially but it then became intolerable.  Is aggravated by laying on back as well as walking.  Denies fever, bladder/bowel dysfunction and lower extremity weakness/parasthesias.   No urinary or vaginal sx.   No recent trauma.  Pt also c/o posterior headache that started nearly simultaneously.  Pain is currently improved.  Denies dizziness, blurred vision, dysarthria, dysphagia, confusion and ataxia.     Past Medical History  Diagnosis Date  . GERD (gastroesophageal reflux disease)   . Hypercholesteremia   . History of atrial fibrillation 2007    with flagyl  . Hypertension     has not been to a cardiologist  . Arthritis     knee, hip  . History of endometriosis     Past Surgical History  Procedure Date  . Exploratory laparotomy 1973    for endometrosis  . Mandible surgery 1978  . Rectovaginal fistula repair 1980  . Total hip arthroplasty 05/08/2012    Procedure: TOTAL HIP ARTHROPLASTY;  Surgeon: [name], MD;  Location: [facility] OR;  Service: Orthopedics;  Laterality: Left;    No family history on file.  History  Substance Use Topics  . Smoking status: Former Smoker -- 1.0 packs/day for 10 years    Quit date: 11/08/1981  . Smokeless tobacco: Not on file  . Alcohol Use: No    OB History    Grav Para Term Preterm Abortions TAB SAB Ect Mult Living                  Review  of Systems  All other systems reviewed and are negative.    Allergies  Other; Metronidazole; and Septra  Home Medications   Current Outpatient Rx  Name Route Sig Dispense Refill  . CEPHALEXIN 500 MG PO CAPS Oral Take 1 capsule (500 mg total) by mouth 2 (two) times daily. 13 capsule 0  . GEMFIBROZIL 600 MG PO TABS Oral Take 600 mg by mouth 2 (two) times daily before a meal.    . HYDROCODONE-ACETAMINOPHEN 5-500 MG PO TABS Oral Take 1 tablet by mouth every 6 (six) hours as needed. For pain    . METHOCARBAMOL 500 MG PO TABS Oral Take 500 mg by mouth 4 (four) times daily.    Marland Kitchen OMEPRAZOLE 20 MG PO CPDR Oral Take 20 mg by mouth daily.    . OXYCODONE-ACETAMINOPHEN 5-325 MG PO TABS Oral Take 1 tablet by mouth every 4 (four) hours as needed. For pain    . TRAMADOL HCL 50 MG PO TABS Oral Take 50 mg by mouth every 6 (six) hours as needed. For pain    . TRIAMTERENE-HCTZ 75-50 MG PO TABS Oral Take 1 tablet by mouth daily.    Marland Kitchen DIAZEPAM 5 MG PO TABS Oral Take 1 tablet (5 mg total) by mouth 2 (two) times daily. 10 tablet 0    BP 125/69  Pulse 74  Temp 98.7 F (37.1 C) (Oral)  Resp 16  SpO2 96%  Physical Exam  Nursing note and vitals reviewed. Constitutional: She is oriented to person, place, and time. She appears well-developed and well-nourished.  HENT:  Head: Normocephalic and atraumatic.  Eyes:       Normal appearance  Neck: Normal range of motion.       No meningeal signs  Cardiovascular: Normal rate and regular rhythm.   Pulmonary/Chest: Effort normal and breath sounds normal.  Genitourinary:       No CVA ttp  Musculoskeletal:       No tenderness thoracic or lumbar spine.  Left thoracic paraspinal tenderness and soft tissue tenderness across lower back. Full active ROM of LE.  Nml patellar reflexes.  No saddle anesthesia. Distal sensation intact.  2+ DP pulses.  Ambulates with a cane w/out diffulty.   Neurological: She is alert and oriented to person, place, and time.       CN 3-12  intact.  No sensory deficits.  5/5 and equal upper and lower extremity strength.  No past pointing.  No ataxia.   Skin: Skin is warm and dry. No rash noted.  Psychiatric: She has a normal mood and affect. Her behavior is normal.    ED Course  Procedures (including critical care time)  Labs Reviewed - No data to display No results found.   1. Low back pain   2. Headache       MDM  66yo F presents w/ non-traumatic, diffuse low back pain, for which she was evaluated by ortho earlier today, and prescribed robaxin and percocet.  Pain has been refractory to these medications.  Pain has improved since receiving percocet and ibuprofen in triage and she declines additional pain medication now.   She is ambulatory and indication for emergent imaging this morning.  Prescribed valium to try in place of robaxin and explained that she can take up to two of her percocet at a time.  Recommended ice/heat and rest as well.  She has PT and ortho f/u scheduled.  Also c/o posterior headache that started nearly simultaneously; also improved currently.  No focal neuro deficits or meningeal signs and pt afebrile and well-appearing.  Recommended ibuprofen if headache returns.  Return precautions discussed.       Otilio Miu, [place] 08/09/12 [phone]

## 2012-08-09 NOTE — ED Notes (Addendum)
Here for back pain, here recently for back pain, seen by PCP last Thursday, seen by ortho MD today. Pain comes and goes, now more constant, intensity fluctuates. Last week pain was mid back, now pain is low back and posteror head. Medicines have helped some, but pain has moved, and changed, and worsened. Denies sx other than pain.  Has had percocet and robaxin PTA. Still taking abx for UTI. Had hip surgery in July.

## 2012-08-09 NOTE — ED Provider Notes (Signed)
Medical screening examination/treatment/procedure(s) were performed by non-physician practitioner and as supervising physician I was immediately available for consultation/collaboration.  Olivia Mackie, MD 08/09/12 [phone]

## 2012-08-23 ENCOUNTER — Ambulatory Visit
Admission: RE | Admit: 2012-08-23 | Discharge: 2012-08-23 | Disposition: A | Discharge status: Home / Self Care | Payer: Medicare Other | Source: Ambulatory Visit | Attending: Family Medicine | Admitting: Family Medicine

## 2012-08-23 DIAGNOSIS — Z1231 Encounter for screening mammogram for malignant neoplasm of breast: Secondary | ICD-10-CM

## 2013-01-12 ENCOUNTER — Encounter (HOSPITAL_COMMUNITY): Payer: Self-pay | Admitting: Pharmacy Technician

## 2013-01-17 ENCOUNTER — Other Ambulatory Visit: Payer: Self-pay | Admitting: Orthopedic Surgery

## 2013-01-17 NOTE — Pre-Procedure Instructions (Signed)
IFEOMA VALLIN  01/17/2013   Your procedure is scheduled on:  01/24/13  Report to [facility] at 11 AM.  Call this number if you have problems the morning of surgery: [phone]   Remember:   Do not eat food or drink liquids after midnight.   Take these medicines the morning of surgery with A SIP OF WATER: lopid,hydrocodone,prilosec   Do not wear jewelry, make-up or nail polish.  Do not wear lotions, powders, or perfumes. You may wear deodorant.  Do not shave 48 hours prior to surgery. Men may shave face and neck.  Do not bring valuables to the hospital.  Contacts, dentures or bridgework may not be worn into surgery.  Leave suitcase in the car. After surgery it may be brought to your room.  For patients admitted to the hospital, checkout time is 11:00 AM the day of  discharge.   Patients discharged the day of surgery will not be allowed to drive  home.  Name and phone number of your driver: family  Special Instructions: Shower using CHG 2 nights before surgery and the night before surgery.  If you shower the day of surgery use CHG.  Use special wash - you have one bottle of CHG for all showers.  You should use approximately 1/3 of the bottle for each shower.   Please read over the following fact sheets that you were given: Pain Booklet, Coughing and Deep Breathing, Blood Transfusion Information, MRSA Information and Surgical Site Infection Prevention

## 2013-01-18 ENCOUNTER — Encounter (HOSPITAL_COMMUNITY): Payer: Self-pay

## 2013-01-18 ENCOUNTER — Encounter (HOSPITAL_COMMUNITY)
Admission: RE | Admit: 2013-01-18 | Discharge: 2013-01-18 | Disposition: A | Discharge status: Home / Self Care | Payer: Medicare Other | Source: Ambulatory Visit | Attending: Orthopedic Surgery | Admitting: Orthopedic Surgery

## 2013-01-18 HISTORY — DX: Nausea with vomiting, unspecified: R11.2

## 2013-01-18 HISTORY — DX: Adverse effect of unspecified anesthetic, initial encounter: T41.45XA

## 2013-01-18 HISTORY — DX: Cardiac murmur, unspecified: R01.1

## 2013-01-18 HISTORY — DX: Other complications of anesthesia, initial encounter: T88.59XA

## 2013-01-18 HISTORY — DX: Cardiac arrhythmia, unspecified: I49.9

## 2013-01-18 HISTORY — DX: Umbilical hernia without obstruction or gangrene: K42.9

## 2013-01-18 HISTORY — DX: Other specified postprocedural states: Z98.890

## 2013-01-18 LAB — SURGICAL PCR SCREEN: MRSA, PCR: NEGATIVE

## 2013-01-18 LAB — CBC
Platelets: 240 10*3/uL (ref 150–400)
RBC: 4.25 MIL/uL (ref 3.87–5.11)
RDW: 13.2 % (ref 11.5–15.5)
WBC: 5.4 10*3/uL (ref 4.0–10.5)

## 2013-01-18 LAB — NO BLOOD PRODUCTS

## 2013-01-23 MED ORDER — CEFAZOLIN SODIUM-DEXTROSE 2-3 GM-% IV SOLR
2.0000 g | INTRAVENOUS | Status: DC
  Filled 2013-01-23: qty 50

## 2013-01-23 NOTE — H&P (Signed)
Kathleen Wiley is an 67 y.o. female.   Chief Complaint: Right knee pain HPI: Kathleen Wiley returns in followup for her right knee.  She has end-stage arthritis in the medial compartment by x-ray.  She has had extensive conservative treatment with anti-inflammatory medicines, cortisone injections and attempts at weight loss.  Her last cortisone injection provided her with only a few days of pain relief.  She comes in today to discuss knee replacement.  She had a hip replacement last year and did very well after surgery.    Past Medical History  Diagnosis Date  . GERD (gastroesophageal reflux disease)   . Hypercholesteremia   . History of atrial fibrillation 2007    with flagyl  . Hypertension     has not been to a cardiologist  . Arthritis     knee, hip  . History of endometriosis   . Complication of anesthesia   . PONV (postoperative nausea and vomiting)   . Dysrhythmia     Afib due to Flagyl- 7 -2007- not problems since  . Heart murmur     "slight "  informed years ago.-   . Hernia, umbilical     Past Surgical History  Procedure Laterality Date  . Exploratory laparotomy  1973    for endometrosis  . Mandible surgery  1978  . Rectovaginal fistula repair  1980  . Total hip arthroplasty  05/08/2012    Procedure: TOTAL HIP ARTHROPLASTY;  Surgeon: [name], MD;  Location: [facility] OR;  Service: Orthopedics;  Laterality: Left;  . Joint replacement      Left Hip    No family history on file. Social History:  reports that she quit smoking about 31 years ago. She does not have any smokeless tobacco history on file. She reports that she does not drink alcohol or use illicit drugs.  Allergies:  Allergies  Allergen Reactions  . Other     Refuses whole blood but does accept albumin  . Metronidazole Other (See Comments)    Chest pain, A-fib   . Septra (Sulfamethoxazole W-Trimethoprim) Other (See Comments)    Blisters     No prescriptions prior to admission    No results found for  this or any previous visit (from the past 48 hour(s)). No results found.  Review of Systems  Constitutional: Negative.   HENT: Negative.   Eyes: Negative.   Respiratory: Negative.   Cardiovascular: Negative.   Gastrointestinal: Negative.   Genitourinary: Negative.   Musculoskeletal: Positive for joint pain.  Skin: Negative.   Neurological: Negative.   Endo/Heme/Allergies: Bruises/bleeds easily.  Psychiatric/Behavioral: The patient is nervous/anxious.     There were no vitals taken for this visit. Physical Exam  Constitutional: She is oriented to person, place, and time. She appears well-developed and well-nourished.  HENT:  Head: Normocephalic.  Eyes: Pupils are equal, round, and reactive to light.  Cardiovascular: Normal heart sounds.   GI: Bowel sounds are normal.  Musculoskeletal:       Right knee: She exhibits decreased range of motion and effusion. Tenderness found. Medial joint line tenderness noted.  Neurological: She is alert and oriented to person, place, and time.  Psychiatric: She has a normal mood and affect.    Exam of the right knee demonstrates a 10 flexion contracture and a 5 varus deformity.  She is markedly tender to palpation along the medial joint line with palpable crepitance with range of motion.   Assessment/Plan Assess: End-stage arthritis of the right knee  Plan: We have discussed knee replacement surgery in detail with Kathleen Wiley today.  She understands all risks and benefits and would like to proceed.  She is a [employer] so we will check her hemoglobin now to see if she needs erythropoietin preoperatively.  She will continue Norco for pain.  Kathleen Wiley M. 01/23/2013, 9:10 AM

## 2013-01-24 ENCOUNTER — Inpatient Hospital Stay (HOSPITAL_COMMUNITY)
Admission: RE | Admit: 2013-01-24 | Discharge: 2013-01-26 | DRG: 470 | Disposition: A | Discharge status: Skilled Nursing Facility | Payer: Medicare Other | Source: Ambulatory Visit | Attending: Orthopedic Surgery | Admitting: Orthopedic Surgery

## 2013-01-24 ENCOUNTER — Inpatient Hospital Stay (HOSPITAL_COMMUNITY): Payer: Medicare Other | Admitting: Anesthesiology

## 2013-01-24 ENCOUNTER — Encounter (HOSPITAL_COMMUNITY): Payer: Self-pay | Admitting: Anesthesiology

## 2013-01-24 ENCOUNTER — Encounter (HOSPITAL_COMMUNITY): Payer: Self-pay | Admitting: *Deleted

## 2013-01-24 ENCOUNTER — Encounter (HOSPITAL_COMMUNITY)
Admission: RE | Disposition: A | Discharge status: Skilled Nursing Facility | Payer: Self-pay | Source: Ambulatory Visit | Attending: Orthopedic Surgery

## 2013-01-24 DIAGNOSIS — Z79899 Other long term (current) drug therapy: Secondary | ICD-10-CM

## 2013-01-24 DIAGNOSIS — Z01812 Encounter for preprocedural laboratory examination: Secondary | ICD-10-CM

## 2013-01-24 DIAGNOSIS — I10 Essential (primary) hypertension: Secondary | ICD-10-CM | POA: Diagnosis present

## 2013-01-24 DIAGNOSIS — M1711 Unilateral primary osteoarthritis, right knee: Secondary | ICD-10-CM | POA: Diagnosis present

## 2013-01-24 DIAGNOSIS — K219 Gastro-esophageal reflux disease without esophagitis: Secondary | ICD-10-CM | POA: Diagnosis present

## 2013-01-24 DIAGNOSIS — Z96649 Presence of unspecified artificial hip joint: Secondary | ICD-10-CM

## 2013-01-24 DIAGNOSIS — E7800 Pure hypercholesterolemia, unspecified: Secondary | ICD-10-CM | POA: Diagnosis present

## 2013-01-24 DIAGNOSIS — M1710 Unilateral primary osteoarthritis, unspecified knee: Principal | ICD-10-CM | POA: Diagnosis present

## 2013-01-24 DIAGNOSIS — Z7982 Long term (current) use of aspirin: Secondary | ICD-10-CM

## 2013-01-24 HISTORY — DX: Procedure and treatment not carried out because of patient's decision for reasons of belief and group pressure: Z53.1

## 2013-01-24 HISTORY — DX: Reserved for inherently not codable concepts without codable children: IMO0001

## 2013-01-24 HISTORY — PX: TOTAL KNEE ARTHROPLASTY: SHX125

## 2013-01-24 LAB — BASIC METABOLIC PANEL
BUN: 21 mg/dL (ref 6–23)
CO2: 27 mEq/L (ref 19–32)
Chloride: 98 mEq/L (ref 96–112)
GFR calc non Af Amer: 72 mL/min — ABNORMAL LOW (ref >90)
Glucose, Bld: 115 mg/dL — ABNORMAL HIGH (ref 70–99)
Potassium: 4.1 mEq/L (ref 3.5–5.1)
Sodium: 136 mEq/L (ref 135–145)

## 2013-01-24 LAB — CBC WITH DIFFERENTIAL/PLATELET
Eosinophils Absolute: 0.1 10*3/uL (ref 0.0–0.7)
HCT: 39.1 % (ref 36.0–46.0)
Hemoglobin: 13.4 g/dL (ref 12.0–15.0)
Lymphs Abs: 1.6 10*3/uL (ref 0.7–4.0)
MCH: 31.9 pg (ref 26.0–34.0)
MCV: 93.1 fL (ref 78.0–100.0)
Monocytes Absolute: 0.4 10*3/uL (ref 0.1–1.0)
Monocytes Relative: 8 % (ref 3–12)
Neutrophils Relative %: 57 % (ref 43–77)
RBC: 4.2 MIL/uL (ref 3.87–5.11)

## 2013-01-24 LAB — PROTIME-INR: INR: 1.1 (ref 0.00–1.49)

## 2013-01-24 SURGERY — ARTHROPLASTY, KNEE, TOTAL
Anesthesia: General | Site: Knee | Laterality: Right | Wound class: Clean

## 2013-01-24 MED ORDER — MENTHOL 3 MG MT LOZG: 1.0000 | LOZENGE | OROMUCOSAL | Status: DC | PRN

## 2013-01-24 MED ORDER — BUPIVACAINE HCL 0.25 % IJ SOLN
INTRAMUSCULAR | Status: DC | PRN
  Administered 2013-01-24: 10 mL

## 2013-01-24 MED ORDER — ONDANSETRON HCL 4 MG/2ML IJ SOLN: 4.0000 mg | Freq: Four times a day (QID) | INTRAMUSCULAR | Status: DC | PRN

## 2013-01-24 MED ORDER — PHENYLEPHRINE HCL 10 MG/ML IJ SOLN
INTRAMUSCULAR | Status: DC | PRN
  Administered 2013-01-24 (×2): 40 ug via INTRAVENOUS

## 2013-01-24 MED ORDER — VITAMIN B-12 1000 MCG PO TABS
2500.0000 ug | ORAL_TABLET | Freq: Every day | ORAL | Status: DC
  Administered 2013-01-25 – 2013-01-26 (×2): 2500 ug via ORAL
  Filled 2013-01-24 (×2): qty 1

## 2013-01-24 MED ORDER — CEFUROXIME SODIUM 1.5 G IJ SOLR
INTRAMUSCULAR | Status: DC | PRN
  Administered 2013-01-24: 1.5 g

## 2013-01-24 MED ORDER — ACETAMINOPHEN 650 MG RE SUPP: 650.0000 mg | Freq: Four times a day (QID) | RECTAL | Status: DC | PRN

## 2013-01-24 MED ORDER — OXYCODONE HCL 5 MG PO TABS
ORAL_TABLET | ORAL | Status: AC
  Administered 2013-01-24: 5 mg via ORAL
  Filled 2013-01-24: qty 1

## 2013-01-24 MED ORDER — OXYCODONE HCL 5 MG PO TABS
5.0000 mg | ORAL_TABLET | ORAL | Status: DC | PRN
  Administered 2013-01-24 – 2013-01-26 (×10): 10 mg via ORAL
  Filled 2013-01-24 (×10): qty 2

## 2013-01-24 MED ORDER — BUPIVACAINE LIPOSOME 1.3 % IJ SUSP
20.0000 mL | INTRAMUSCULAR | Status: AC
  Administered 2013-01-24: 20 mL
  Filled 2013-01-24: qty 20

## 2013-01-24 MED ORDER — ROCURONIUM BROMIDE 100 MG/10ML IV SOLN
INTRAVENOUS | Status: DC | PRN
  Administered 2013-01-24: 50 mg via INTRAVENOUS

## 2013-01-24 MED ORDER — CYANOCOBALAMIN 2500 MCG PO TABS: 1.0000 | ORAL_TABLET | Freq: Every day | ORAL | Status: DC

## 2013-01-24 MED ORDER — MIDAZOLAM HCL 5 MG/5ML IJ SOLN
INTRAMUSCULAR | Status: DC | PRN
  Administered 2013-01-24: 2 mg via INTRAVENOUS

## 2013-01-24 MED ORDER — NEOSTIGMINE METHYLSULFATE 1 MG/ML IJ SOLN
INTRAMUSCULAR | Status: DC | PRN
  Administered 2013-01-24: 3 mg via INTRAVENOUS

## 2013-01-24 MED ORDER — METHOCARBAMOL 100 MG/ML IJ SOLN
500.0000 mg | Freq: Four times a day (QID) | INTRAVENOUS | Status: DC | PRN
  Filled 2013-01-24: qty 5

## 2013-01-24 MED ORDER — METOCLOPRAMIDE HCL 10 MG PO TABS: 5.0000 mg | ORAL_TABLET | Freq: Three times a day (TID) | ORAL | Status: DC | PRN

## 2013-01-24 MED ORDER — ONDANSETRON HCL 4 MG/2ML IJ SOLN
INTRAMUSCULAR | Status: DC | PRN
  Administered 2013-01-24: 4 mg via INTRAVENOUS

## 2013-01-24 MED ORDER — ONDANSETRON HCL 4 MG PO TABS
4.0000 mg | ORAL_TABLET | Freq: Four times a day (QID) | ORAL | Status: DC | PRN
  Administered 2013-01-26: 4 mg via ORAL
  Filled 2013-01-24: qty 1

## 2013-01-24 MED ORDER — SODIUM CHLORIDE 0.9 % IJ SOLN
INTRAMUSCULAR | Status: DC | PRN
  Administered 2013-01-24: 20 mL

## 2013-01-24 MED ORDER — BUPIVACAINE HCL (PF) 0.25 % IJ SOLN
INTRAMUSCULAR | Status: AC
  Filled 2013-01-24: qty 10

## 2013-01-24 MED ORDER — HYDROMORPHONE HCL PF 1 MG/ML IJ SOLN
0.2500 mg | INTRAMUSCULAR | Status: DC | PRN
  Administered 2013-01-24: 0.5 mg via INTRAVENOUS

## 2013-01-24 MED ORDER — LACTATED RINGERS IV SOLN
INTRAVENOUS | Status: DC | PRN
  Administered 2013-01-24 (×2): via INTRAVENOUS

## 2013-01-24 MED ORDER — TRANEXAMIC ACID 100 MG/ML IV SOLN
1000.0000 mg | INTRAVENOUS | Status: AC
  Administered 2013-01-24: 1000 mg via INTRAVENOUS
  Filled 2013-01-24: qty 10

## 2013-01-24 MED ORDER — ARTIFICIAL TEARS OP OINT
TOPICAL_OINTMENT | OPHTHALMIC | Status: DC | PRN
  Administered 2013-01-24: 1 via OPHTHALMIC

## 2013-01-24 MED ORDER — GEMFIBROZIL 600 MG PO TABS
600.0000 mg | ORAL_TABLET | Freq: Two times a day (BID) | ORAL | Status: DC
  Administered 2013-01-24 – 2013-01-26 (×5): 600 mg via ORAL
  Filled 2013-01-24 (×6): qty 1

## 2013-01-24 MED ORDER — FLEET ENEMA 7-19 GM/118ML RE ENEM: 1.0000 | ENEMA | Freq: Once | RECTAL | Status: AC | PRN

## 2013-01-24 MED ORDER — HYDROMORPHONE HCL PF 1 MG/ML IJ SOLN
1.0000 mg | INTRAMUSCULAR | Status: DC | PRN
  Administered 2013-01-24 – 2013-01-26 (×4): 1 mg via INTRAVENOUS
  Filled 2013-01-24 (×3): qty 1

## 2013-01-24 MED ORDER — LIDOCAINE HCL (CARDIAC) 20 MG/ML IV SOLN
INTRAVENOUS | Status: DC | PRN
  Administered 2013-01-24: 80 mg via INTRAVENOUS

## 2013-01-24 MED ORDER — CEFAZOLIN SODIUM-DEXTROSE 2-3 GM-% IV SOLR
INTRAVENOUS | Status: DC | PRN
  Administered 2013-01-24: 2 g via INTRAVENOUS

## 2013-01-24 MED ORDER — TRIAMTERENE-HCTZ 75-50 MG PO TABS
1.0000 | ORAL_TABLET | Freq: Every day | ORAL | Status: DC
  Administered 2013-01-26: 1 via ORAL
  Filled 2013-01-24 (×2): qty 1

## 2013-01-24 MED ORDER — BISACODYL 5 MG PO TBEC: 5.0000 mg | DELAYED_RELEASE_TABLET | Freq: Every day | ORAL | Status: DC | PRN

## 2013-01-24 MED ORDER — METHOCARBAMOL 500 MG PO TABS
ORAL_TABLET | ORAL | Status: AC
  Filled 2013-01-24: qty 1

## 2013-01-24 MED ORDER — LACTATED RINGERS IV SOLN
INTRAVENOUS | Status: DC
  Administered 2013-01-24: 12:00:00 via INTRAVENOUS

## 2013-01-24 MED ORDER — OXYCODONE HCL 5 MG PO TABS: 5.0000 mg | ORAL_TABLET | Freq: Once | ORAL | Status: AC | PRN

## 2013-01-24 MED ORDER — ALUM & MAG HYDROXIDE-SIMETH 200-200-20 MG/5ML PO SUSP: 30.0000 mL | ORAL | Status: DC | PRN

## 2013-01-24 MED ORDER — ACETAMINOPHEN 10 MG/ML IV SOLN
INTRAVENOUS | Status: AC
  Administered 2013-01-24: 1000 mg via INTRAVENOUS
  Filled 2013-01-24: qty 100

## 2013-01-24 MED ORDER — METHOCARBAMOL 500 MG PO TABS
500.0000 mg | ORAL_TABLET | Freq: Four times a day (QID) | ORAL | Status: DC | PRN
  Administered 2013-01-24 – 2013-01-26 (×6): 500 mg via ORAL
  Filled 2013-01-24 (×6): qty 1

## 2013-01-24 MED ORDER — HYDROMORPHONE HCL PF 1 MG/ML IJ SOLN
INTRAMUSCULAR | Status: AC
  Administered 2013-01-24: 0.5 mg via INTRAVENOUS
  Filled 2013-01-24: qty 1

## 2013-01-24 MED ORDER — CHLORHEXIDINE GLUCONATE 4 % EX LIQD: 60.0000 mL | Freq: Once | CUTANEOUS | Status: DC

## 2013-01-24 MED ORDER — KCL IN DEXTROSE-NACL 20-5-0.45 MEQ/L-%-% IV SOLN
INTRAVENOUS | Status: DC
  Administered 2013-01-24: 17:00:00 via INTRAVENOUS
  Filled 2013-01-24 (×8): qty 1000

## 2013-01-24 MED ORDER — PROPOFOL 10 MG/ML IV BOLUS
INTRAVENOUS | Status: DC | PRN
  Administered 2013-01-24: 180 mg via INTRAVENOUS

## 2013-01-24 MED ORDER — DEXAMETHASONE SODIUM PHOSPHATE 10 MG/ML IJ SOLN
INTRAMUSCULAR | Status: DC | PRN
  Administered 2013-01-24: 8 mg via INTRAVENOUS

## 2013-01-24 MED ORDER — DIPHENHYDRAMINE HCL 25 MG PO CAPS: 25.0000 mg | ORAL_CAPSULE | Freq: Every evening | ORAL | Status: DC | PRN

## 2013-01-24 MED ORDER — MAGNESIUM HYDROXIDE 400 MG/5ML PO SUSP: 30.0000 mL | Freq: Every day | ORAL | Status: DC | PRN

## 2013-01-24 MED ORDER — CEFUROXIME SODIUM 1.5 G IJ SOLR
INTRAMUSCULAR | Status: AC
  Filled 2013-01-24: qty 1.5

## 2013-01-24 MED ORDER — PANTOPRAZOLE SODIUM 40 MG PO TBEC
40.0000 mg | DELAYED_RELEASE_TABLET | Freq: Every day | ORAL | Status: DC
  Administered 2013-01-25 – 2013-01-26 (×2): 40 mg via ORAL
  Filled 2013-01-24 (×2): qty 1

## 2013-01-24 MED ORDER — DIPHENHYDRAMINE HCL 25 MG PO TABS
25.0000 mg | ORAL_TABLET | Freq: Every evening | ORAL | Status: DC | PRN
  Filled 2013-01-24: qty 1

## 2013-01-24 MED ORDER — GLYCOPYRROLATE 0.2 MG/ML IJ SOLN
INTRAMUSCULAR | Status: DC | PRN
  Administered 2013-01-24: 0.4 mg via INTRAVENOUS

## 2013-01-24 MED ORDER — SODIUM CHLORIDE 0.9 % IJ SOLN
INTRAMUSCULAR | Status: AC
  Filled 2013-01-24: qty 6

## 2013-01-24 MED ORDER — SODIUM CHLORIDE 0.9 % IR SOLN
Status: DC | PRN
  Administered 2013-01-24: 3000 mL
  Administered 2013-01-24: 1000 mL

## 2013-01-24 MED ORDER — PHENOL 1.4 % MT LIQD: 1.0000 | OROMUCOSAL | Status: DC | PRN

## 2013-01-24 MED ORDER — DEXTROSE-NACL 5-0.45 % IV SOLN: INTRAVENOUS | Status: DC

## 2013-01-24 MED ORDER — METOCLOPRAMIDE HCL 5 MG/ML IJ SOLN: 5.0000 mg | Freq: Three times a day (TID) | INTRAMUSCULAR | Status: DC | PRN

## 2013-01-24 MED ORDER — ACETAMINOPHEN 10 MG/ML IV SOLN
1000.0000 mg | Freq: Four times a day (QID) | INTRAVENOUS | Status: AC
  Administered 2013-01-25 (×3): 1000 mg via INTRAVENOUS
  Filled 2013-01-24 (×3): qty 100

## 2013-01-24 MED ORDER — ACETAMINOPHEN 325 MG PO TABS
650.0000 mg | ORAL_TABLET | Freq: Four times a day (QID) | ORAL | Status: DC | PRN
  Administered 2013-01-25: 650 mg via ORAL
  Filled 2013-01-24: qty 2

## 2013-01-24 MED ORDER — OXYCODONE HCL 5 MG/5ML PO SOLN
5.0000 mg | Freq: Once | ORAL | Status: AC | PRN
Start: 2013-01-24 — End: 2013-01-24

## 2013-01-24 MED ORDER — ASPIRIN EC 325 MG PO TBEC
325.0000 mg | DELAYED_RELEASE_TABLET | Freq: Two times a day (BID) | ORAL | Status: DC
  Administered 2013-01-24 – 2013-01-26 (×4): 325 mg via ORAL
  Filled 2013-01-24 (×5): qty 1

## 2013-01-24 MED ORDER — FENTANYL CITRATE 0.05 MG/ML IJ SOLN
INTRAMUSCULAR | Status: DC | PRN
  Administered 2013-01-24: 50 ug via INTRAVENOUS
  Administered 2013-01-24: 150 ug via INTRAVENOUS
  Administered 2013-01-24: 50 ug via INTRAVENOUS

## 2013-01-24 MED ORDER — HYDROMORPHONE HCL PF 1 MG/ML IJ SOLN
INTRAMUSCULAR | Status: AC
  Filled 2013-01-24: qty 1

## 2013-01-24 MED ORDER — DIPHENHYDRAMINE HCL 12.5 MG/5ML PO ELIX: 12.5000 mg | ORAL_SOLUTION | ORAL | Status: DC | PRN

## 2013-01-24 SURGICAL SUPPLY — 52 items
BANDAGE ESMARK 6X9 LF (GAUZE/BANDAGES/DRESSINGS) ×1 IMPLANT
BLADE SAG 18X100X1.27 (BLADE) ×2 IMPLANT
BLADE SAW SGTL 13X75X1.27 (BLADE) ×2 IMPLANT
BLADE SURG ROTATE 9660 (MISCELLANEOUS) IMPLANT
BNDG ELASTIC 6X10 VLCR STRL LF (GAUZE/BANDAGES/DRESSINGS) ×2 IMPLANT
BNDG ESMARK 6X9 LF (GAUZE/BANDAGES/DRESSINGS) ×2
BOWL SMART MIX CTS (DISPOSABLE) ×2 IMPLANT
CEMENT HV SMART SET (Cement) ×4 IMPLANT
CLOTH BEACON ORANGE TIMEOUT ST (SAFETY) ×2 IMPLANT
COVER SURGICAL LIGHT HANDLE (MISCELLANEOUS) ×2 IMPLANT
CUFF TOURNIQUET SINGLE 34IN LL (TOURNIQUET CUFF) IMPLANT
CUFF TOURNIQUET SINGLE 44IN (TOURNIQUET CUFF) ×2 IMPLANT
DRAPE EXTREMITY T 121X128X90 (DRAPE) ×2 IMPLANT
DRAPE U-SHAPE 47X51 STRL (DRAPES) ×2 IMPLANT
DURAPREP 26ML APPLICATOR (WOUND CARE) ×2 IMPLANT
ELECT REM PT RETURN 9FT ADLT (ELECTROSURGICAL) ×2
ELECTRODE REM PT RTRN 9FT ADLT (ELECTROSURGICAL) ×1 IMPLANT
EVACUATOR 1/8 PVC DRAIN (DRAIN) ×2 IMPLANT
GAUZE XEROFORM 1X8 LF (GAUZE/BANDAGES/DRESSINGS) ×2 IMPLANT
GLOVE BIO SURGEON STRL SZ7 (GLOVE) ×2 IMPLANT
GLOVE BIO SURGEON STRL SZ7.5 (GLOVE) ×2 IMPLANT
GLOVE BIOGEL PI IND STRL 7.0 (GLOVE) ×2 IMPLANT
GLOVE BIOGEL PI IND STRL 8 (GLOVE) ×1 IMPLANT
GLOVE BIOGEL PI INDICATOR 7.0 (GLOVE) ×2
GLOVE BIOGEL PI INDICATOR 8 (GLOVE) ×1
GLOVE SURG SS PI 6.5 STRL IVOR (GLOVE) ×2 IMPLANT
GOWN PREVENTION PLUS XLARGE (GOWN DISPOSABLE) ×4 IMPLANT
GOWN STRL NON-REIN LRG LVL3 (GOWN DISPOSABLE) ×2 IMPLANT
HANDPIECE INTERPULSE COAX TIP (DISPOSABLE) ×1
HOOD PEEL AWAY FACE SHEILD DIS (HOOD) ×4 IMPLANT
KIT BASIN OR (CUSTOM PROCEDURE TRAY) ×2 IMPLANT
KIT ROOM TURNOVER OR (KITS) ×2 IMPLANT
MANIFOLD NEPTUNE II (INSTRUMENTS) ×2 IMPLANT
NEEDLE 22X1 1/2 (OR ONLY) (NEEDLE) ×2 IMPLANT
NS IRRIG 1000ML POUR BTL (IV SOLUTION) ×2 IMPLANT
PACK TOTAL JOINT (CUSTOM PROCEDURE TRAY) ×2 IMPLANT
PAD ARMBOARD 7.5X6 YLW CONV (MISCELLANEOUS) ×4 IMPLANT
PADDING CAST COTTON 6X4 STRL (CAST SUPPLIES) ×2 IMPLANT
SET HNDPC FAN SPRY TIP SCT (DISPOSABLE) ×1 IMPLANT
SPONGE GAUZE 4X4 12PLY (GAUZE/BANDAGES/DRESSINGS) ×2 IMPLANT
STAPLER VISISTAT 35W (STAPLE) ×2 IMPLANT
SUCTION FRAZIER TIP 10 FR DISP (SUCTIONS) ×2 IMPLANT
SUT VIC AB 0 CTX 36 (SUTURE) ×1
SUT VIC AB 0 CTX36XBRD ANTBCTR (SUTURE) ×1 IMPLANT
SUT VIC AB 1 CTX 36 (SUTURE) ×1
SUT VIC AB 1 CTX36XBRD ANBCTR (SUTURE) ×1 IMPLANT
SUT VIC AB 2-0 CT1 27 (SUTURE) ×1
SUT VIC AB 2-0 CT1 TAPERPNT 27 (SUTURE) ×1 IMPLANT
TOWEL OR 17X24 6PK STRL BLUE (TOWEL DISPOSABLE) ×2 IMPLANT
TOWEL OR 17X26 10 PK STRL BLUE (TOWEL DISPOSABLE) ×2 IMPLANT
TRAY FOLEY CATH 14FR (SET/KITS/TRAYS/PACK) ×2 IMPLANT
WATER STERILE IRR 1000ML POUR (IV SOLUTION) ×4 IMPLANT

## 2013-01-24 NOTE — Preoperative (Signed)
Beta Blockers   Reason not to administer Beta Blockers:Not Applicable 

## 2013-01-24 NOTE — Progress Notes (Signed)
Utilization review completed.  

## 2013-01-24 NOTE — Progress Notes (Signed)
Orthopedic Tech Progress Note Patient Details:  Kathleen Wiley [birth year] [id number]  CPM Right Knee CPM Right Knee: On Right Knee Flexion (Degrees): 60 Right Knee Extension (Degrees): 0 Additional Comments: trapeze bar   Cammer, Mickie Bail 01/24/2013, 3:14 PM

## 2013-01-24 NOTE — Plan of Care (Signed)
Problem: Consults Goal: Diagnosis- Total Joint Replacement Primary Total Knee     

## 2013-01-24 NOTE — Interval H&P Note (Signed)
History and Physical Interval Note:  01/24/2013 12:16 PM  Kathleen Wiley  has presented today for surgery, with the diagnosis of RIGHT KNEE DEGENERATIVE JOINT DISEASE  The various methods of treatment have been discussed with the patient and family. After consideration of risks, benefits and other options for treatment, the patient has consented to  Procedure(s) with comments: TOTAL KNEE ARTHROPLASTY (Right) - DEPUY as a surgical intervention .  The patient's history has been reviewed, patient examined, no change in status, stable for surgery.  I have reviewed the patient's chart and labs.  Questions were answered to the patient's satisfaction.     [name]

## 2013-01-24 NOTE — Anesthesia Preprocedure Evaluation (Signed)
Anesthesia Evaluation  Patient identified by MRN, date of birth, ID band Patient awake    Reviewed: Allergy & Precautions, H&P , NPO status , Patient's Chart, lab work & pertinent test results  History of Anesthesia Complications (+) PONV  Airway Mallampati: II  Neck ROM: full    Dental   Pulmonary          Cardiovascular hypertension,     Neuro/Psych    GI/Hepatic GERD-  ,  Endo/Other  Morbid obesity  Renal/GU      Musculoskeletal  (+) Arthritis -,   Abdominal   Peds  Hematology   Anesthesia Other Findings   Reproductive/Obstetrics                           Anesthesia Physical Anesthesia Plan  ASA: II  Anesthesia Plan: General   Post-op Pain Management:    Induction: Intravenous  Airway Management Planned: Oral ETT  Additional Equipment:   Intra-op Plan:   Post-operative Plan: Extubation in OR  Informed Consent: I have reviewed the patients History and Physical, chart, labs and discussed the procedure including the risks, benefits and alternatives for the proposed anesthesia with the patient or authorized representative who has indicated his/her understanding and acceptance.     Plan Discussed with: CRNA and Surgeon  Anesthesia Plan Comments:         Anesthesia Quick Evaluation

## 2013-01-24 NOTE — Op Note (Signed)
PATIENT ID:      Kathleen Wiley  MRN:     [id number] DOB/AGE:    Aug 21, 1946 / 67 y.o.       OPERATIVE REPORT    DATE OF PROCEDURE:  01/24/2013       PREOPERATIVE DIAGNOSIS:   RIGHT KNEE DEGENERATIVE JOINT DISEASE      Estimated body mass index is 39.07 kg/(m^2) as calculated from the following:   Height as of 01/18/13: 5\' 5"  (1.651 m).   Weight as of 05/03/12: 106.5 kg (234 lb 12.6 oz).                                                        POSTOPERATIVE DIAGNOSIS:   RIGHT KNEE DEGENERATIVE JOINT DISEASE                                                                      PROCEDURE:  Procedure(s): RIGHT TOTAL KNEE ARTHROPLASTY Using Depuy Sigma RP implants #4NR Femur, #3Tibia, 10mm sigma RP bearing, 35 Patella     SURGEON: Nil Xiong J    ASSISTANT:   Shirl Harris PA-C   (Present and scrubbed throughout the case, critical for assistance with exposure, retraction, instrumentation, and closure.)         ANESTHESIA: GET, 20CC Exeparel in  Capsule and SQ  DRAINS: foley, 2 medium hemovac in knee   TOURNIQUET TIME:   COMPLICATIONS:  None     SPECIMENS: None   INDICATIONS FOR PROCEDURE: The patient has  RIGHT KNEE DEGENERATIVE JOINT DISEASE, varus deformities, XR shows bone on bone arthritis. Patient has failed all conservative measures including anti-inflammatory medicines, narcotics, attempts at  exercise and weight loss, cortisone injections and viscosupplementation.  Risks and benefits of surgery have been discussed, questions answered.   DESCRIPTION OF PROCEDURE: The patient identified by armband, received  1,000mg  Tranexamic acid  and IV antibiotics, in the holding area at [facility]. Patient taken to the operating room, appropriate anesthetic  monitors were attached General endotracheal anesthesia induced with  the patient in supine position, Foley catheter was inserted. Tourniquet  applied high to the operative thigh. Lateral post and foot positioner  applied to  the table, the lower extremity was then prepped and draped  in usual sterile fashion from the ankle to the tourniquet. Time-out procedure was performed. The limb was wrapped with an Esmarch bandage and the tourniquet inflated to 350 mmHg. We began the operation by making the anterior midline incision starting at handbreadth above the patella going over the patella 1 cm medial to and  4 cm distal to the tibial tubercle. Small bleeders in the skin and the  subcutaneous tissue identified and cauterized. Transverse retinaculum was incised and reflected medially and a medial parapatellar arthrotomy was accomplished. the patella was everted and theprepatellar fat pad resected. The superficial medial collateral  ligament was then elevated from anterior to posterior along the proximal  flare of the tibia and anterior half of the menisci resected. The knee was hyperflexed exposing bone on bone arthritis. Peripheral and notch osteophytes as well  as the cruciate ligaments were then resected. We continued to  work our way around posteriorly along the proximal tibia, and externally  rotated the tibia subluxing it out from underneath the femur. A McHale  retractor was placed through the notch and a lateral Hohmann retractor  placed, and we then drilled through the proximal tibia in line with the  axis of the tibia followed by an intramedullary guide rod and 2-degree  posterior slope cutting guide. The tibial cutting guide was pinned into place  allowing resection of 3 mm of bone medially and about 8 mm of bone  laterally because of her varus deformity. Satisfied with the tibial resection, we then  entered the distal femur 2 mm anterior to the PCL origin with the  intramedullary guide rod and applied the distal femoral cutting guide  set at 11mm, with 5 degrees of valgus. This was pinned along the  epicondylar axis. At this point, the distal femoral cut was accomplished without difficulty. We then sized for a #4NR  femoral component and pinned the guide in 3 degrees of external rotation.The chamfer cutting guide was pinned into place. The anterior, posterior, and chamfer cuts were accomplished without difficulty followed by  the Sigma RP box cutting guide and the box cut. We also removed posterior osteophytes from the posterior femoral condyles. At this  time, the knee was brought into full extension. We checked our  extension and flexion gaps and found them symmetric at 10mm.  The patella thickness measured at 23 mm. We set the cutting guide at 14 and removed the posterior 9 mm  of the patella sized for 35 button and drilled the lollipop. The knee  was then once again hyperflexed exposing the proximal tibia. We sized for a #3 tibial base plate, applied the smokestack and the conical reamer followed by the the Delta fin keel punch. We then hammered into place the Sigma RP trial femoral component, inserted a 10-mm trial bearing, trial patellar button, and took the knee through range of motion from 0-130 degrees. No thumb pressure was required for patellar  tracking. At this point, all trial components were removed, a double batch of DePuy HV cement with 1500 mg of Zinacef was mixed and applied to all bony metallic mating surfaces except for the posterior condyles of the femur itself. In order, we  hammered into place the tibial tray and removed excess cement, the femoral component and removed excess cement, a 10-mm Sigma RP bearing  was inserted, and the knee brought to full extension with compression.  The patellar button was clamped into place, and excess cement  removed. While the cement cured the wound was irrigated out with normal saline solution pulse lavage, and medium Hemovac drains were placed from an anterolateral  approach. Ligament stability and patellar tracking were checked and found to be excellent. The parapatellar arthrotomy was closed with  running #1 Vicryl suture. The subcutaneous tissue with 0  and 2-0 undyed  Vicryl suture, and the skin with skin staples. A dressing of Xeroform,  4 x 4, dressing sponges, Webril, and Ace wrap applied. The patient  awakened, extubated, and taken to recovery room without difficulty.   Gean Birchwood J 01/24/2013, 1:55 PM

## 2013-01-25 ENCOUNTER — Encounter (HOSPITAL_COMMUNITY): Payer: Self-pay | Admitting: Orthopedic Surgery

## 2013-01-25 LAB — BASIC METABOLIC PANEL
BUN: 16 mg/dL (ref 6–23)
Chloride: 103 mEq/L (ref 96–112)
Creatinine, Ser: 0.72 mg/dL (ref 0.50–1.10)
GFR calc Af Amer: >90 mL/min (ref >90)
GFR calc non Af Amer: 88 mL/min — ABNORMAL LOW (ref >90)
Potassium: 3.9 mEq/L (ref 3.5–5.1)

## 2013-01-25 LAB — CBC
HCT: 33.9 % — ABNORMAL LOW (ref 36.0–46.0)
MCHC: 35.7 g/dL (ref 30.0–36.0)
MCV: 91.1 fL (ref 78.0–100.0)
Platelets: 200 10*3/uL (ref 150–400)
RDW: 13.3 % (ref 11.5–15.5)
WBC: 7.9 10*3/uL (ref 4.0–10.5)

## 2013-01-25 NOTE — Evaluation (Signed)
Physical Therapy Evaluation Patient Details Name: Kathleen Wiley MRN: [id number] DOB: Apr 29, 1946 Today's Date: 01/25/2013 Time: 0940-1010 PT Time Calculation (min): 30 min  PT Assessment / Plan / Recommendation Clinical Impression  Pt is a 67 y/o female s/p R TKA. Pt to be followed by acute PT to progress mobility.  Pt home alone most of the day.      PT Assessment  Patient needs continued PT services    Follow Up Recommendations  SNF;Supervision/Assistance - 24 hour    Does the patient have the potential to tolerate intense rehabilitation      Barriers to Discharge Inaccessible home environment;Decreased caregiver support full flight of stairs and no consistent caregiver during the day.      Equipment Recommendations  None recommended by PT    Recommendations for Other Services     Frequency 7X/week    Precautions / Restrictions Precautions Precautions: Fall Restrictions Weight Bearing Restrictions: Yes RLE Weight Bearing: Weight bearing as tolerated   Pertinent Vitals/Pain 6/10 pain in knee when weight bearing on RLE.  Pt medicated prior to session.       Mobility  Bed Mobility Bed Mobility: Supine to Sit;Sitting - Scoot to Edge of Bed Supine to Sit: 4: Min assist;With rails;HOB flat Sitting - Scoot to [place] of Bed: 4: Min assist;With rail Sit to Supine: Not Tested (comment) Details for Bed Mobility Assistance: Assist for RLE   Transfers Transfers: Sit to Stand;Stand to Sit Sit to Stand: 4: Min assist;From bed;From chair/3-in-1;With upper extremity assist Stand to Sit: 4: Min assist;To chair/3-in-1;With upper extremity assist Details for Transfer Assistance: Verbal cues for hand placement assist to steady pt.   Ambulation/Gait Ambulation/Gait Assistance: 4: Min assist Ambulation Distance (Feet): 10 Feet Assistive device: Rolling walker Ambulation/Gait Assistance Details: verbal cues for gait sequencing.  Gait Pattern: Step-to pattern;Decreased stride  length Gait velocity: slow Stairs: No    Exercises Total Joint Exercises Ankle Circles/Pumps: Both;10 reps   PT Diagnosis: Difficulty walking;Generalized weakness;Acute pain  PT Problem List: Decreased strength;Decreased activity tolerance;Decreased mobility;Decreased range of motion;Decreased knowledge of use of DME;Pain PT Treatment Interventions: Gait training;DME instruction;Functional mobility training;Stair training;Therapeutic activities;Therapeutic exercise;Patient/family education   PT Goals Acute Rehab PT Goals PT Goal Formulation: With patient Time For Goal Achievement: 02/01/13 Potential to Achieve Goals: Good Pt will go Supine/Side to Sit: with supervision PT Goal: Supine/Side to Sit - Progress: Goal set today Pt will go Sit to Supine/Side: with supervision PT Goal: Sit to Supine/Side - Progress: Goal set today Pt will Transfer Bed to Chair/Chair to Bed: with supervision PT Transfer Goal: Bed to Chair/Chair to Bed - Progress: Goal set today Pt will Ambulate: 51 - 150 feet;with supervision;with rolling walker PT Goal: Ambulate - Progress: Goal set today Pt will Go Up / Down Stairs: 3-5 stairs;with supervision;with least restrictive assistive device PT Goal: Up/Down Stairs - Progress: Goal set today Pt will Perform Home Exercise Program: with supervision, verbal cues required/provided PT Goal: Perform Home Exercise Program - Progress: Goal set today  Visit Information  Last PT Received On: 01/25/13    Subjective Data  Subjective: Agree to PT eval.  Patient Stated Goal: Walk without pain   Prior Functioning  Home Living Lives With: Spouse Available Help at Discharge: Skilled Nursing Facility Type of Home: Apartment Home Access: Stairs to enter [employer] of Steps: 3 Entrance Stairs-Rails: None Home Layout: Two level;Bed/bath upstairs Alternate Level Stairs-Number of Steps: 13 Bathroom Shower/Tub: Tub/shower unit;Curtain Firefighter:  Standard Bathroom Accessibility: Yes How Accessible:  Accessible via walker Home Adaptive Equipment: Walker - rolling;Walker - standard;Straight cane;Bedside commode/3-in-1;Shower chair with back Prior Function Level of Independence: Independent with assistive device(s) Able to Take Stairs?: Yes Driving: Yes Vocation: Retired Musician: No difficulties Dominant Hand: Right    Cognition  Cognition Overall Cognitive Status: Appears within functional limits for tasks assessed/performed Arousal/Alertness: Awake/alert Orientation Level: Appears intact for tasks assessed Behavior During Session: [facility] for tasks performed    Extremity/Trunk Assessment Right Upper Extremity Assessment RUE ROM/Strength/Tone: Within functional levels Left Upper Extremity Assessment LUE ROM/Strength/Tone: Within functional levels Right Lower Extremity Assessment RLE ROM/Strength/Tone: Unable to fully assess;Due to pain Left Lower Extremity Assessment LLE ROM/Strength/Tone: Within functional levels   Balance Balance Balance Assessed: No  End of Session PT - End of Session Equipment Utilized During Treatment: Gait belt Activity Tolerance: Patient tolerated treatment well Patient left: in chair;with call bell/phone within reach Nurse Communication: Mobility status CPM Right Knee CPM Right Knee: Off Right Knee Flexion (Degrees): 60  GP     Kathleen Wiley 01/25/2013, 11:54 AM Kathleen Wiley Kathleen Wiley DPT [phone]

## 2013-01-25 NOTE — Transfer of Care (Signed)
Immediate Anesthesia Transfer of Care Note  Patient: Kathleen Wiley  Procedure(s) Performed: Procedure(s): RIGHT TOTAL KNEE ARTHROPLASTY (Right)  Patient Location: PACU  Anesthesia Type:General  Level of Consciousness: awake, alert  and oriented  Airway & Oxygen Therapy: Patient Spontanous Breathing and Patient connected to face mask oxygen  Post-op Assessment: Report given to PACU RN  Post vital signs: Reviewed and stable  Complications: No apparent anesthesia complications

## 2013-01-25 NOTE — Anesthesia Postprocedure Evaluation (Signed)
  Anesthesia Post-op Note  Patient: Kathleen Wiley  Procedure(s) Performed: Procedure(s): RIGHT TOTAL KNEE ARTHROPLASTY (Right)  Patient Location: PACU  Anesthesia Type:General  Level of Consciousness: awake, alert  and oriented  Airway and Oxygen Therapy: Patient Spontanous Breathing and Patient connected to face mask oxygen  Post-op Pain: none  Post-op Assessment: Post-op Vital signs reviewed, Patient's Cardiovascular Status Stable, Respiratory Function Stable, Patent Airway and No signs of Nausea or vomiting  Post-op Vital Signs: Reviewed and stable  Complications: No apparent anesthesia complications

## 2013-01-25 NOTE — Care Management Note (Signed)
CARE MANAGEMENT NOTE 01/25/2013  Patient:  Kathleen Wiley, Kathleen Wiley   Account Number:  [id number]  Date Initiated:  01/25/2013  Documentation initiated by:  Vance Peper  Subjective/Objective Assessment:   67 yr old female s/p right total knee arthroplasty.     Action/Plan:   CM spoke with patient concerning home health and DME needs at discharge. Patient states she will be going to SNF for shortterm rehab. Social worker is aware.   Anticipated DC Date:  01/27/2013   Anticipated DC Plan:  SKILLED NURSING FACILITY  In-house referral  Clinical Social Worker      DC Planning Services  CM consult      Choice offered to / List presented to:             Status of service:  Completed, signed off Medicare Important Message given?   (If response is "NO", the following Medicare IM given date fields will be blank) Date Medicare IM given:   Date Additional Medicare IM given:    Discharge Disposition:  SKILLED NURSING FACILITY  Per UR Regulation:    If discussed at Long Length of Stay Meetings, dates discussed:    Comments:

## 2013-01-25 NOTE — Progress Notes (Signed)
PATIENT ID: Kathleen Wiley  MRN: [id number]  DOB/AGE:  June 30, 1946 / 67 y.o.  1 Day Post-Op Procedure(s) (LRB): RIGHT TOTAL KNEE ARTHROPLASTY (Right)    PROGRESS NOTE Subjective: Patient is alert, oriented, no Nausea, no Vomiting, yes passing gas, no Bowel Movement. Taking PO well. Denies SOB, Chest or Calf Pain. Using Incentive Spirometer, PAS in place. Patient reports pain as moderate  .    Objective: Vital signs in last 24 hours: Filed Vitals:   01/25/13 0038 01/25/13 0142 01/25/13 0400 01/25/13 0500  BP:  106/58  111/52  Pulse:  70  68  Temp:  98 F (36.7 C)  98.6 F (37 C)  TempSrc:  Oral  Oral  Resp: 18 18 18 18   SpO2: 94% 95% 94% 96%      Intake/Output from previous day: I/O last 3 completed shifts: In: 1350 [P.O.:50; I.V.:1300] Out: 2325 [Urine:2100; Drains:200; Blood:25]   Intake/Output this shift: Total I/O In: 600 [P.O.:600] Out: -    LABORATORY DATA:  Recent Labs  01/24/13 1033 01/25/13 0614  WBC 4.8 7.9  HGB 13.4 12.1  HCT 39.1 33.9*  PLT 237 200  NA 136 139  K 4.1 3.9  CL 98 103  CO2 27 25  BUN 21 16  CREATININE 0.83 0.72  GLUCOSE 115* 168*  INR 1.10  --   CALCIUM 9.4 8.7    Examination: Neurologically intact ABD soft Neurovascular intact Sensation intact distally Intact pulses distally Dorsiflexion/Plantar flexion intact Incision: dressing C/D/I}  Drain pulled - blood and plasma separation noted in tube  Assessment:   1 Day Post-Op Procedure(s) (LRB): RIGHT TOTAL KNEE ARTHROPLASTY (Right) ADDITIONAL DIAGNOSIS:  none  Plan: PT/OT WBAT, CPM 5/hrs day until ROM 0-90 degrees, then D/C CPM DVT Prophylaxis:  SCDx72hrs, ASA 325 mg BID x 2 weeks DISCHARGE PLAN: Skilled Nursing Facility/Rehab DISCHARGE NEEDS: HHPT, HHRN, CPM, Walker and 3-in-1 comode seat     Chaitanya Amedee M. 01/25/2013, 9:41 AM

## 2013-01-26 DIAGNOSIS — M1711 Unilateral primary osteoarthritis, right knee: Secondary | ICD-10-CM | POA: Diagnosis present

## 2013-01-26 LAB — CBC
MCHC: 34.8 g/dL (ref 30.0–36.0)
RDW: 13.7 % (ref 11.5–15.5)
WBC: 6.2 10*3/uL (ref 4.0–10.5)

## 2013-01-26 MED ORDER — BISACODYL 5 MG PO TBEC: 5.0000 mg | DELAYED_RELEASE_TABLET | Freq: Every day | ORAL | Status: DC | PRN

## 2013-01-26 MED ORDER — ASPIRIN 325 MG PO TBEC: 325.0000 mg | DELAYED_RELEASE_TABLET | Freq: Two times a day (BID) | ORAL | Status: DC

## 2013-01-26 MED ORDER — METHOCARBAMOL 500 MG PO TABS: 500.0000 mg | ORAL_TABLET | Freq: Four times a day (QID) | ORAL | Status: DC | PRN

## 2013-01-26 MED ORDER — OXYCODONE-ACETAMINOPHEN 5-325 MG PO TABS: 1.0000 | ORAL_TABLET | ORAL | Status: DC | PRN

## 2013-01-26 NOTE — Progress Notes (Signed)
Report called to SNF facility. All questions answered.

## 2013-01-26 NOTE — Progress Notes (Signed)
Order received, chart reviewed,noted plan is for pt to D/C to SNF. Will defer eval to that facility, if D/C plan is changed to home OR OT eval is needed for SNF placement will be glad to see. Ignacia Palma, [place] [phone] 01/26/2013  Office # [phone]

## 2013-01-26 NOTE — Progress Notes (Signed)
PATIENT ID: Kathleen Wiley  MRN: [id number]  DOB/AGE:  66-May-1947 / 67 y.o.  2 Days Post-Op Procedure(s) (LRB): RIGHT TOTAL KNEE ARTHROPLASTY (Right)    PROGRESS NOTE Subjective: Patient is alert, oriented, no Nausea, no Vomiting, yes passing gas, no Bowel Movement. Taking PO well. Denies SOB, Chest or Calf Pain. Using Incentive Spirometer, PAS in place. Ambulating slowly with PT. Patient reports pain as moderate  .    Objective: Vital signs in last 24 hours: Filed Vitals:   01/25/13 0500 01/25/13 1300 01/25/13 2019 01/26/13 0537  BP: 111/52 105/48 128/61 132/61  Pulse: 68 77 82 78  Temp: 98.6 F (37 C) 97.6 F (36.4 C) 99 F (37.2 C) 98.4 F (36.9 C)  TempSrc: Oral Oral Oral Oral  Resp: 18 18 18 18   SpO2: 96% 97% 98% 97%      Intake/Output from previous day: I/O last 3 completed shifts: In: 720 [P.O.:720] Out: 2325 [Urine:2200; Drains:125]   Intake/Output this shift: Total I/O In: 120 [P.O.:120] Out: -    LABORATORY DATA:  Recent Labs  01/24/13 1033 01/25/13 0614 01/26/13 0512  WBC 4.8 7.9 6.2  HGB 13.4 12.1 11.3*  HCT 39.1 33.9* 32.5*  PLT 237 200 186  NA 136 139  --   K 4.1 3.9  --   CL 98 103  --   CO2 27 25  --   BUN 21 16  --   CREATININE 0.83 0.72  --   GLUCOSE 115* 168*  --   INR 1.10  --   --   CALCIUM 9.4 8.7  --     Examination: Neurologically intact ABD soft Neurovascular intact Sensation intact distally Intact pulses distally Dorsiflexion/Plantar flexion intact Incision: dressing C/D/I}  Assessment:   2 Days Post-Op Procedure(s) (LRB): RIGHT TOTAL KNEE ARTHROPLASTY (Right) ADDITIONAL DIAGNOSIS:  none  Plan: PT/OT WBAT, CPM 5/hrs day until ROM 0-90 degrees, then D/C CPM DVT Prophylaxis:  SCDx72hrs, ASA 325 mg BID x 2 weeks DISCHARGE PLAN: Skilled Nursing Facility/Rehab when bed available DISCHARGE NEEDS: HHPT, HHRN, CPM, Walker and 3-in-1 comode seat     Valari Taylor M. 01/26/2013, 9:33 AM

## 2013-01-26 NOTE — Progress Notes (Signed)
Physical Therapy Treatment Patient Details Name: Kathleen Wiley MRN: [id number] DOB: 16-Jul-1946 Today's Date: 01/26/2013 Time: 6578-4696 PT Time Calculation (min): 24 min  PT Assessment / Plan / Recommendation Comments on Treatment Session  Patient s/p R TKA. Patient stiil with some increased pain. Encouragement to participate and increase ambulation and therex. Patient wanting to talk with CSW regarding DC plans as patients home enviroment in inacessable at this time and she does not have 24 hour assistance at home    Follow Up Recommendations  SNF;Supervision/Assistance - 24 hour     Does the patient have the potential to tolerate intense rehabilitation     Barriers to Discharge        Equipment Recommendations  None recommended by PT    Recommendations for Other Services    Frequency 7X/week   Plan Discharge plan remains appropriate;Frequency remains appropriate    Precautions / Restrictions Restrictions Weight Bearing Restrictions: Yes RLE Weight Bearing: Weight bearing as tolerated   Pertinent Vitals/Pain     Mobility  Bed Mobility Supine to Sit: 4: Min assist;With rails;HOB flat Sitting - Scoot to Edge of Bed: 4: Min assist;With rail Details for Bed Mobility Assistance: Assist for RLE  Cues for technique/positioning Transfers Sit to Stand: 4: Min assist;From bed;From chair/3-in-1;With upper extremity assist Stand to Sit: 4: Min assist;To chair/3-in-1;With upper extremity assist Details for Transfer Assistance: Verbal cues for hand placement. A to initiate stand and to control descent Ambulation/Gait Ambulation/Gait Assistance: 4: Min assist Ambulation Distance (Feet): 20 Feet Assistive device: Rolling walker Ambulation/Gait Assistance Details: Cues for posture. Patient with good sequence and attempting to lift R LE higher with advancing  Gait Pattern: Step-to pattern;Decreased step length - right;Decreased step length - left;Trunk flexed Gait velocity: slow     Exercises Total Joint Exercises Ankle Circles/Pumps: Both;10 reps Quad Sets: AROM;Right;10 reps Heel Slides: AAROM;Right;10 reps Hip ABduction/ADduction: AAROM;Right;10 reps   PT Diagnosis:    PT Problem List:   PT Treatment Interventions:     PT Goals Acute Rehab PT Goals PT Goal: Supine/Side to Sit - Progress: Progressing toward goal PT Transfer Goal: Bed to Chair/Chair to Bed - Progress: Progressing toward goal PT Goal: Ambulate - Progress: Progressing toward goal PT Goal: Perform Home Exercise Program - Progress: Progressing toward goal  Visit Information  Last PT Received On: 01/26/13 Assistance Needed: +1    Subjective Data      Cognition  Cognition Overall Cognitive Status: Appears within functional limits for tasks assessed/performed Arousal/Alertness: Awake/alert Orientation Level: Appears intact for tasks assessed Behavior During Session: [facility] for tasks performed    Balance     End of Session PT - End of Session Equipment Utilized During Treatment: Gait belt Activity Tolerance: Patient tolerated treatment well Patient left: in chair;with call bell/phone within reach Nurse Communication: Mobility status CPM Right Knee CPM Right Knee: Off   GP     Kathleen Wiley 01/26/2013, 9:51 AM 01/26/2013 Kathleen Wiley PTA [phone] pager [phone] office

## 2013-01-26 NOTE — Discharge Summary (Signed)
Patient ID: Kathleen Wiley MRN: [id number] DOB/AGE: 11-19-1945 67 y.o.  Admit date: 01/24/2013 Discharge date: 01/27/13  Admission Diagnoses:  Principal Problem:   Osteoarthritis of right knee   Discharge Diagnoses:  Same  Past Medical History  Diagnosis Date  . GERD (gastroesophageal reflux disease)   . Hypercholesteremia   . History of atrial fibrillation 2007    with flagyl  . Hypertension     has not been to a cardiologist  . Arthritis     knee, hip  . History of endometriosis   . Complication of anesthesia   . PONV (postoperative nausea and vomiting)   . Dysrhythmia     Afib due to Flagyl- 7 -2007- not problems since  . Heart murmur     "slight "  informed years ago.-   . Hernia, umbilical   . Refusal of blood transfusions as patient is Jehovah's Witness     Surgeries: Procedure(s): RIGHT TOTAL KNEE ARTHROPLASTY on 01/24/2013   Consultants:    Discharged Condition: Improved  Hospital Course: Kathleen Wiley is an 66 y.o. female who was admitted 01/24/2013 for operative treatment ofOsteoarthritis of right knee. Patient has severe unremitting pain that affects sleep, daily activities, and work/hobbies. After pre-op clearance the patient was taken to the operating room on 01/24/2013 and underwent  Procedure(s): RIGHT TOTAL KNEE ARTHROPLASTY.    Patient was given perioperative antibiotics: Anti-infectives   Start     Dose/Rate Route Frequency Ordered Stop   01/24/13 1336  cefUROXime (ZINACEF) injection  Status:  Discontinued       As needed 01/24/13 1336 01/24/13 1423   01/24/13 0600  ceFAZolin (ANCEF) IVPB 2 g/50 mL premix  Status:  Discontinued     2 g 100 mL/hr over 30 Minutes Intravenous On call to O.R. 01/23/13 1453 01/24/13 1551       Patient was given sequential compression devices, early ambulation, and chemoprophylaxis to prevent DVT.  Patient benefited maximally from hospital stay and there were no complications.    Recent vital signs: Patient  Vitals for the past 24 hrs:  BP Temp Temp src Pulse Resp SpO2  01/26/13 0537 132/61 mmHg 98.4 F (36.9 C) Oral 78 18 97 %  01/25/13 2019 128/61 mmHg 99 F (37.2 C) Oral 82 18 98 %  01/25/13 1300 105/48 mmHg 97.6 F (36.4 C) Oral 77 18 97 %     Recent laboratory studies:  Recent Labs  01/24/13 1033 01/25/13 0614 01/26/13 0512  WBC 4.8 7.9 6.2  HGB 13.4 12.1 11.3*  HCT 39.1 33.9* 32.5*  PLT 237 200 186  NA 136 139  --   K 4.1 3.9  --   CL 98 103  --   CO2 27 25  --   BUN 21 16  --   CREATININE 0.83 0.72  --   GLUCOSE 115* 168*  --   INR 1.10  --   --   CALCIUM 9.4 8.7  --      Discharge Medications:     Medication List    STOP taking these medications       HYDROcodone-acetaminophen 5-325 MG per tablet  Commonly known as:  NORCO/VICODIN     ibuprofen 200 MG tablet  Commonly known as:  ADVIL,MOTRIN     traMADol 50 MG tablet  Commonly known as:  ULTRAM      TAKE these medications       aspirin 325 MG EC tablet  Take 1 tablet (325 mg total) by  mouth 2 (two) times daily.     bisacodyl 5 MG EC tablet  Commonly known as:  DULCOLAX  Take 1 tablet (5 mg total) by mouth daily as needed.     Cyanocobalamin 2500 MCG Tabs  Take 1 tablet by mouth daily.     diphenhydrAMINE 25 MG tablet  Commonly known as:  BENADRYL  Take 25 mg by mouth at bedtime as needed for itching or sleep.     gemfibrozil 600 MG tablet  Commonly known as:  LOPID  Take 600 mg by mouth 2 (two) times daily before a meal.     methocarbamol 500 MG tablet  Commonly known as:  ROBAXIN  Take 1 tablet (500 mg total) by mouth every 6 (six) hours as needed.     omeprazole 20 MG capsule  Commonly known as:  PRILOSEC  Take 20 mg by mouth daily.     oxyCODONE-acetaminophen 5-325 MG per tablet  Commonly known as:  ROXICET  Take 1-2 tablets by mouth every 4 (four) hours as needed for pain.     triamterene-hydrochlorothiazide 75-50 MG per tablet  Commonly known as:  MAXZIDE  Take 1 tablet by  mouth daily.        Diagnostic Studies: No results found.  Disposition: 01-Home or Self Care      Discharge Orders   Future Orders Complete By Expires     Call MD for:  redness, tenderness, or signs of infection (pain, swelling, redness, odor or green/yellow discharge around incision site)  As directed     Call MD for:  severe uncontrolled pain  As directed     Call MD for:  temperature >100.4  As directed     Change dressing (specify)  As directed     Comments:      Dressing change as needed.    Discharge instructions  As directed     Comments:      F/U with Dr. Turner Daniels as scheduled (POD #14)    Driving Restrictions  As directed     Comments:      No driving for 2 weeks.    Increase activity slowly  As directed     May shower / Bathe  As directed     Walker   As directed           Signed: Vanderbilt Ranieri M. 01/26/2013, 9:37 AM

## 2013-01-26 NOTE — Clinical Social Work Placement (Addendum)
Clinical Social Work Department  CLINICAL SOCIAL WORK PLACEMENT NOTE  01/26/2013  Patient: Kathleen Wiley  Account Number: [id number] Admit date: 01/24/2013  Clinical Social Worker: Sabino Niemann MSW Date/time: 01/26/2013 9:30 AM  Clinical Social Work is seeking post-discharge placement for this patient at the following level of care: SKILLED NURSING (*CSW will update this form in Epic as items are completed)  01/26/2013 Patient/family provided with [facility] Department of Clinical Social Work's list of facilities offering this level of care within the geographic area requested by the patient (or if unable, by the patient's family).  01/26/2013 Patient/family informed of their freedom to choose among providers that offer the needed level of care, that participate in Medicare, Medicaid or managed care program needed by the patient, have an available bed and are willing to accept the patient.  01/26/2013 Patient/family informed of [facility]' ownership interest in [facility], as well as of the fact that they are under no obligation to receive care at this facility.  PASARR submitted to EDS on 01/26/2013  PASARR number received from EDS on 01/26/2013  FL2 transmitted to all facilities in geographic area requested by pt/family on 01/26/2013 FL2 transmitted to all facilities within larger geographic area on  Patient informed that his/her managed care company has contracts with or will negotiate with certain facilities, including the following:  Patient/family informed of bed offers received: 01/26/2013 Patient chooses bed at [facility] Physician recommends and patient chooses bed at  Patient to be transferred to on 01/26/2013 Patient to be transferred to facility by Private Vehicle The following physician request were entered in Epic:  Additional Comments:   Sabino Niemann, MSW,  [phone] Signing Off

## 2013-01-26 NOTE — Clinical Social Work Psychosocial (Signed)
Clinical Social Work Department  BRIEF PSYCHOSOCIAL ASSESSMENT  Patient:Julliana HELLENA PRIDGEN Account Number: [id number]  Admit date: 01/24/13 Clinical Social Worker Sabino Niemann, MSW Date/Time: 01/26/2013 930 am  Referred by: Physician Date Referred: 01/26/2013 Referred for   SNF Placement   Other Referral:  Interview type: Patient  Other interview type: PSYCHOSOCIAL DATA  Living Status:Husband Admitted from facility:  Level of care:  Primary support name: Beaumier,Julio Primary support relationship to patient: Husband Degree of support available:  Strong and vested  CURRENT CONCERNS  Current Concerns   Post-Acute Placement   Other Concerns:  SOCIAL WORK ASSESSMENT / PLAN  CSW met with pt re: PT recommendation for SNF.   Pt lives with her husband   CSW explained placement process and answered questions.   Pt reports [facility]  as her preference    CSW completed FL2 and initiated SNF search.     Assessment/plan status: Information/Referral to [facility]  Other assessment/ plan:  Information/referral to community resources:  SNF     PATIENT'S/FAMILY'S RESPONSE TO PLAN OF CARE:  Pt  reports she is agreeable to ST SNF in order to increase strength and independence with mobility prior to return home  Pt verbalized understanding of placement process and appreciation for CSW assist.   Sabino Niemann, MSW [phone]

## 2013-01-30 ENCOUNTER — Non-Acute Institutional Stay (SKILLED_NURSING_FACILITY): Payer: Medicare Other | Admitting: Internal Medicine

## 2013-01-30 DIAGNOSIS — I10 Essential (primary) hypertension: Secondary | ICD-10-CM

## 2013-01-30 DIAGNOSIS — D62 Acute posthemorrhagic anemia: Secondary | ICD-10-CM

## 2013-01-30 DIAGNOSIS — I4891 Unspecified atrial fibrillation: Secondary | ICD-10-CM

## 2013-02-05 ENCOUNTER — Other Ambulatory Visit: Payer: Self-pay | Admitting: *Deleted

## 2013-02-05 MED ORDER — OXYCODONE-ACETAMINOPHEN 5-325 MG PO TABS: ORAL_TABLET | ORAL | Status: DC

## 2013-02-09 ENCOUNTER — Encounter: Payer: Self-pay | Admitting: Adult Health

## 2013-02-09 ENCOUNTER — Non-Acute Institutional Stay (SKILLED_NURSING_FACILITY): Payer: Medicare Other | Admitting: Adult Health

## 2013-02-09 DIAGNOSIS — K5900 Constipation, unspecified: Secondary | ICD-10-CM | POA: Insufficient documentation

## 2013-02-09 DIAGNOSIS — M1710 Unilateral primary osteoarthritis, unspecified knee: Secondary | ICD-10-CM

## 2013-02-09 DIAGNOSIS — K219 Gastro-esophageal reflux disease without esophagitis: Secondary | ICD-10-CM | POA: Insufficient documentation

## 2013-02-09 DIAGNOSIS — M62838 Other muscle spasm: Secondary | ICD-10-CM

## 2013-02-09 DIAGNOSIS — M1711 Unilateral primary osteoarthritis, right knee: Secondary | ICD-10-CM

## 2013-02-09 DIAGNOSIS — E7800 Pure hypercholesterolemia, unspecified: Secondary | ICD-10-CM | POA: Insufficient documentation

## 2013-02-09 DIAGNOSIS — I10 Essential (primary) hypertension: Secondary | ICD-10-CM | POA: Insufficient documentation

## 2013-02-09 DIAGNOSIS — E785 Hyperlipidemia, unspecified: Secondary | ICD-10-CM | POA: Insufficient documentation

## 2013-02-09 NOTE — Progress Notes (Signed)
  Subjective:    Patient ID: Kathleen Wiley, female    DOB: 04/17/1946, 67 y.o.   MRN: [id number]  HPI This is a 67 year old female who is for discharge home with Home health PT and OT. DME: Hospital bed. She was admitted to [facility] on 01/26/13 from [facility] with Osteoarthritis S/P right total knee arthroplasty. She has been admitted for a short-term rehabilitation.   Review of Systems  Constitutional: Negative.   HENT: Negative.   Eyes: Negative.   Respiratory: Negative for chest tightness and shortness of breath.   Cardiovascular: Negative for leg swelling.  Gastrointestinal: Negative.   Endocrine: Negative.   Genitourinary: Negative.   Neurological: Negative.   Hematological: Negative for adenopathy. Does not bruise/bleed easily.  Psychiatric/Behavioral: Negative.        Objective:   Physical Exam  Constitutional: She is oriented to person, place, and time. She appears well-developed and well-nourished.  HENT:  Head: Normocephalic.  Right Ear: External ear normal.  Left Ear: External ear normal.  Eyes: Conjunctivae are normal. Pupils are equal, round, and reactive to light.  Neck: Normal range of motion. Neck supple.  Cardiovascular: Normal rate, regular rhythm and normal heart sounds.   Pulmonary/Chest: Effort normal.  Abdominal: Soft. Bowel sounds are normal.  Musculoskeletal: Normal range of motion. She exhibits no edema and no tenderness.  Neurological: She is alert and oriented to person, place, and time.  Skin: Skin is warm and dry.  Psychiatric: She has a normal mood and affect. Her behavior is normal. Judgment and thought content normal.   LABS3/14  Wbc 5.2  hgb 11.0  hct 31.7       Assessment & Plan:  Osteoarthritis of right knee S/P right total knee replacement - will be followed-up by Home health PT and OT. DME: Hospital bed  GERD (gastroesophageal reflux disease) - stable  Essential hypertension, benign -  well-controlled  Hypercholesterolemia - continue Gemfibrozil  Unspecified constipation - no complaints  Muscle spasm - stable

## 2013-02-10 NOTE — Progress Notes (Signed)
Patient ID: Kathleen Wiley, female   DOB: 1946/06/06, 67 y.o.   MRN: [id number]        HISTORY & PHYSICAL  DATE:   01/30/2013  FACILITY:  [facility]   LEVEL OF CARE: SNF   ALLERGIES:  Allergies  Allergen Reactions  . Other     Refuses whole blood but does accept albumin  . Metronidazole Other (See Comments)    Chest pain, A-fib   . Septra (Sulfamethoxazole W-Trimethoprim) Other (See Comments)    Blisters      CHIEF COMPLAINT:  Manage right knee osteoarthritis, atrial fibrillation and hypertension.    HISTORY OF PRESENT ILLNESS:  Patient is a 67 year-old, Caucasian female.   KNEE OSTEOARTHRITIS: Patient had a history of pain and functional disability in the knee due to end-stage osteoarthritis and has failed nonsurgical conservative treatments. Patient had worsening of pain with activity and weight bearing, pain that interfered with activities of daily living & pain with passive range of motion. Therefore patient underwent total knee arthroplasty and tolerated the procedure well. Patient is admitted to this facility for sort short-term rehabilitation. Patient denies knee pain.  HTN: Pt 's HTN remains stable.  Denies CP, sob, DOE, pedal edema, headaches, dizziness or visual disturbances.  No complications from the medications currently being used.  Last BP :  105/72.    ATRIAL FIBRILLATION: the patients atrial fibrillation remains stable.  The patient denies DOE, tachycardia, orthopnea, transient neurological sx, pedal edema, palpitations, & PNDs.  No complications noted from the medications currently being used.    PAST MEDICAL HISTORY :  Past Medical History  Diagnosis Date  . GERD (gastroesophageal reflux disease)   . Hypercholesteremia   . History of atrial fibrillation 2007    with flagyl  . Hypertension     has not been to a cardiologist  . Arthritis     knee, hip  . History of endometriosis   . Complication of anesthesia   . PONV (postoperative nausea and  vomiting)   . Dysrhythmia     Afib due to Flagyl- 7 -2007- not problems since  . Heart murmur     "slight "  informed years ago.-   . Hernia, umbilical   . Refusal of blood transfusions as patient is Jehovah's Witness      PAST SURGICAL HISTORY: Past Surgical History  Procedure Laterality Date  . Exploratory laparotomy  1973    for endometrosis  . Mandible surgery  1978  . Rectovaginal fistula repair  1980  . Total hip arthroplasty  05/08/2012    Procedure: TOTAL HIP ARTHROPLASTY;  Surgeon: [name], MD;  Location: [facility];  Service: Orthopedics;  Laterality: Left;  . Joint replacement      Left Hip  . Total knee arthroplasty Right 01/24/2013    Dr Turner Daniels  . Total knee arthroplasty Right 01/24/2013    Procedure: RIGHT TOTAL KNEE ARTHROPLASTY;  Surgeon: [name], MD;  Location: [facility];  Service: Orthopedics;  Laterality: Right;   SOCIAL HISTORY:  reports that she quit smoking about 31 years ago. She has never used smokeless tobacco. She reports that she does not drink alcohol or use illicit drugs.  FAMILY HISTORY: none  CURRENT MEDICATIONS: Reviewed per MAR  REVIEW OF SYSTEMS:  See HPI otherwise 14 point ROS is negative.  PHYSICAL EXAMINATION  VS:  T  97.9      P 81      RR 20      BP  105/72     POX% 92 room air       WT (Lb)  GENERAL: no acute distress, normal body habitus SKIN: warm & dry, no suspicious lesions or rashes, no excessive dryness EYES: conjunctivae normal, sclerae normal, normal eye lids MOUTH/THROAT: lips without lesions,no lesions in the mouth,tongue is without lesions,uvula elevates in midline NECK: supple, trachea midline, no neck masses, no thyroid tenderness, no thyromegaly LYMPHATICS: no LAN in the neck, no supraclavicular LAN RESPIRATORY: breathing is even & unlabored, BS CTAB CARDIAC: RRR, no murmur,no extra heart sounds, no edema GI:  ABDOMEN: abdomen soft, normal BS, no masses, no tenderness  LIVER/SPLEEN: no hepatomegaly, no  splenomegaly MUSCULOSKELETAL: HEAD: normal to inspection & palpation BACK: no kyphosis, scoliosis or spinal processes tenderness EXTREMITIES: LEFT UPPER EXTREMITY: full range of motion, normal strength & tone RIGHT UPPER EXTREMITY:  full range of motion, normal strength & tone LEFT LOWER EXTREMITY:  full range of motion, normal strength & tone RIGHT LOWER EXTREMITY:  Strength intact, range of motion not tested due to surgery. PSYCHIATRIC: the patient is alert & oriented to person, affect & behavior appropriate  LABS/RADIOLOGY: Hemoglobin 11.3, otherwise CBC normal.    Glucose 168, otherwise BMP normal.    ASSESSMENT/PLAN:  Right knee osteoarthritis.  Status post right total knee arthroplasty.  Continue rehabilitation.  Atrial fibrillation.  Rate controlled.    Hypertension.  Well controlled.    Acute blood loss anemia.  Reassess hemoglobin level.   GERD.  Well controlled.    V58.69.  Check CBC and BMP.   I have reviewed patient's medical records received from hospitalization.  CPT CODE: 84132.

## 2013-02-13 DIAGNOSIS — IMO0001 Reserved for inherently not codable concepts without codable children: Secondary | ICD-10-CM

## 2013-02-13 DIAGNOSIS — Z471 Aftercare following joint replacement surgery: Secondary | ICD-10-CM

## 2013-02-13 DIAGNOSIS — Z96659 Presence of unspecified artificial knee joint: Secondary | ICD-10-CM

## 2013-02-13 DIAGNOSIS — I10 Essential (primary) hypertension: Secondary | ICD-10-CM

## 2013-08-29 ENCOUNTER — Other Ambulatory Visit (HOSPITAL_COMMUNITY)
Admission: RE | Admit: 2013-08-29 | Discharge: 2013-08-29 | Disposition: A | Discharge status: Home / Self Care | Payer: Medicare Other | Source: Ambulatory Visit | Attending: Obstetrics & Gynecology | Admitting: Obstetrics & Gynecology

## 2013-08-29 ENCOUNTER — Other Ambulatory Visit: Payer: Self-pay | Admitting: Obstetrics & Gynecology

## 2013-08-29 DIAGNOSIS — R87619 Unspecified abnormal cytological findings in specimens from cervix uteri: Secondary | ICD-10-CM | POA: Insufficient documentation

## 2013-08-29 DIAGNOSIS — Z124 Encounter for screening for malignant neoplasm of cervix: Secondary | ICD-10-CM | POA: Insufficient documentation

## 2013-08-29 DIAGNOSIS — R87810 Cervical high risk human papillomavirus (HPV) DNA test positive: Secondary | ICD-10-CM | POA: Insufficient documentation

## 2013-09-28 ENCOUNTER — Other Ambulatory Visit: Payer: Self-pay | Admitting: Obstetrics & Gynecology

## 2014-04-18 ENCOUNTER — Emergency Department (HOSPITAL_COMMUNITY): Payer: Medicare Other

## 2014-04-18 ENCOUNTER — Encounter (HOSPITAL_COMMUNITY): Payer: Self-pay | Admitting: Emergency Medicine

## 2014-04-18 ENCOUNTER — Emergency Department (HOSPITAL_COMMUNITY)
Admission: EM | Admit: 2014-04-18 | Discharge: 2014-04-19 | Disposition: A | Discharge status: Home / Self Care | Payer: Medicare Other | Attending: Emergency Medicine | Admitting: Emergency Medicine

## 2014-04-18 DIAGNOSIS — Z87891 Personal history of nicotine dependence: Secondary | ICD-10-CM | POA: Insufficient documentation

## 2014-04-18 DIAGNOSIS — Z8739 Personal history of other diseases of the musculoskeletal system and connective tissue: Secondary | ICD-10-CM | POA: Insufficient documentation

## 2014-04-18 DIAGNOSIS — S46909A Unspecified injury of unspecified muscle, fascia and tendon at shoulder and upper arm level, unspecified arm, initial encounter: Secondary | ICD-10-CM | POA: Diagnosis not present

## 2014-04-18 DIAGNOSIS — S0993XA Unspecified injury of face, initial encounter: Secondary | ICD-10-CM | POA: Diagnosis present

## 2014-04-18 DIAGNOSIS — Y92410 Unspecified street and highway as the place of occurrence of the external cause: Secondary | ICD-10-CM | POA: Insufficient documentation

## 2014-04-18 DIAGNOSIS — S4980XA Other specified injuries of shoulder and upper arm, unspecified arm, initial encounter: Secondary | ICD-10-CM | POA: Diagnosis not present

## 2014-04-18 DIAGNOSIS — IMO0002 Reserved for concepts with insufficient information to code with codable children: Secondary | ICD-10-CM | POA: Insufficient documentation

## 2014-04-18 DIAGNOSIS — Z79899 Other long term (current) drug therapy: Secondary | ICD-10-CM | POA: Diagnosis not present

## 2014-04-18 DIAGNOSIS — I10 Essential (primary) hypertension: Secondary | ICD-10-CM | POA: Diagnosis not present

## 2014-04-18 DIAGNOSIS — Y9389 Activity, other specified: Secondary | ICD-10-CM | POA: Diagnosis not present

## 2014-04-18 DIAGNOSIS — T148XXA Other injury of unspecified body region, initial encounter: Secondary | ICD-10-CM

## 2014-04-18 DIAGNOSIS — R011 Cardiac murmur, unspecified: Secondary | ICD-10-CM | POA: Diagnosis not present

## 2014-04-18 DIAGNOSIS — Z8742 Personal history of other diseases of the female genital tract: Secondary | ICD-10-CM | POA: Insufficient documentation

## 2014-04-18 DIAGNOSIS — S199XXA Unspecified injury of neck, initial encounter: Secondary | ICD-10-CM | POA: Diagnosis present
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
## 2014-04-19 MED ORDER — IBUPROFEN 800 MG PO TABS: 800.0000 mg | ORAL_TABLET | Freq: Three times a day (TID) | ORAL | Status: DC

## 2014-04-19 MED ORDER — METHOCARBAMOL 500 MG PO TABS: 500.0000 mg | ORAL_TABLET | Freq: Two times a day (BID) | ORAL | Status: DC
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
## 2014-04-20 NOTE — ED Provider Notes (Signed)
Medical screening examination/treatment/procedure(s) were performed by non-physician practitioner and as supervising physician I was immediately available for consultation/collaboration.   EKG Interpretation None        Fredia Sorrow, MD 04/20/14 [phone]

## 2014-09-04 ENCOUNTER — Other Ambulatory Visit: Payer: Self-pay | Admitting: Obstetrics & Gynecology

## 2014-09-04 ENCOUNTER — Other Ambulatory Visit (HOSPITAL_COMMUNITY)
Admission: RE | Admit: 2014-09-04 | Discharge: 2014-09-04 | Disposition: A | Discharge status: Home / Self Care | Payer: Medicare Other | Source: Ambulatory Visit | Attending: Obstetrics & Gynecology | Admitting: Obstetrics & Gynecology

## 2014-09-04 DIAGNOSIS — Z124 Encounter for screening for malignant neoplasm of cervix: Secondary | ICD-10-CM | POA: Insufficient documentation

## 2014-09-05 LAB — CYTOLOGY - PAP

## 2015-07-01 ENCOUNTER — Other Ambulatory Visit: Payer: Self-pay

## 2015-07-01 DIAGNOSIS — Z1231 Encounter for screening mammogram for malignant neoplasm of breast: Secondary | ICD-10-CM

## 2015-07-10 ENCOUNTER — Ambulatory Visit
Admission: RE | Admit: 2015-07-10 | Discharge: 2015-07-10 | Disposition: A | Discharge status: Home / Self Care | Payer: Medicare Other | Source: Ambulatory Visit

## 2015-07-10 DIAGNOSIS — Z1231 Encounter for screening mammogram for malignant neoplasm of breast: Secondary | ICD-10-CM

## 2016-03-08 ENCOUNTER — Telehealth: Payer: Self-pay | Admitting: *Deleted

## 2016-03-08 NOTE — Telephone Encounter (Signed)
Opened in error

## 2016-06-10 ENCOUNTER — Encounter (HOSPITAL_COMMUNITY): Payer: Self-pay | Admitting: *Deleted

## 2016-06-10 ENCOUNTER — Ambulatory Visit (HOSPITAL_COMMUNITY): Payer: Medicare Other | Admitting: Anesthesiology

## 2016-06-10 ENCOUNTER — Encounter (HOSPITAL_COMMUNITY)
Admission: RE | Disposition: A | Discharge status: Home / Self Care | Payer: Self-pay | Source: Ambulatory Visit | Attending: Orthopedic Surgery

## 2016-06-10 ENCOUNTER — Ambulatory Visit (HOSPITAL_COMMUNITY)
Admission: RE | Admit: 2016-06-10 | Discharge: 2016-06-11 | Disposition: A | Discharge status: Home / Self Care | Payer: Medicare Other | Source: Ambulatory Visit | Attending: Orthopedic Surgery | Admitting: Orthopedic Surgery

## 2016-06-10 DIAGNOSIS — M65141 Other infective (teno)synovitis, right hand: Secondary | ICD-10-CM | POA: Diagnosis not present

## 2016-06-10 DIAGNOSIS — Z87891 Personal history of nicotine dependence: Secondary | ICD-10-CM | POA: Diagnosis not present

## 2016-06-10 DIAGNOSIS — Z96642 Presence of left artificial hip joint: Secondary | ICD-10-CM | POA: Insufficient documentation

## 2016-06-10 DIAGNOSIS — Z96651 Presence of right artificial knee joint: Secondary | ICD-10-CM | POA: Insufficient documentation

## 2016-06-10 DIAGNOSIS — I10 Essential (primary) hypertension: Secondary | ICD-10-CM | POA: Insufficient documentation

## 2016-06-10 DIAGNOSIS — K219 Gastro-esophageal reflux disease without esophagitis: Secondary | ICD-10-CM | POA: Insufficient documentation

## 2016-06-10 DIAGNOSIS — E7800 Pure hypercholesterolemia, unspecified: Secondary | ICD-10-CM | POA: Insufficient documentation

## 2016-06-10 DIAGNOSIS — Z79899 Other long term (current) drug therapy: Secondary | ICD-10-CM | POA: Diagnosis not present

## 2016-06-10 HISTORY — PX: I&D EXTREMITY: SHX5045

## 2016-06-10 LAB — CBC
HCT: 40.2 % (ref 36.0–46.0)
Hemoglobin: 13.2 g/dL (ref 12.0–15.0)
MCH: 32.4 pg (ref 26.0–34.0)
MCHC: 32.8 g/dL (ref 30.0–36.0)
MCV: 98.5 fL (ref 78.0–100.0)
Platelets: 201 10*3/uL (ref 150–400)
RBC: 4.08 MIL/uL (ref 3.87–5.11)
RDW: 13.4 % (ref 11.5–15.5)
WBC: 5.9 10*3/uL (ref 4.0–10.5)

## 2016-06-10 LAB — BASIC METABOLIC PANEL
Anion gap: 8 (ref 5–15)
BUN: 12 mg/dL (ref 6–20)
CHLORIDE: 101 mmol/L (ref 101–111)
CO2: 28 mmol/L (ref 22–32)
CREATININE: 0.75 mg/dL (ref 0.44–1.00)
Calcium: 9.2 mg/dL (ref 8.9–10.3)
Glucose, Bld: 129 mg/dL — ABNORMAL HIGH (ref 65–99)
POTASSIUM: 3.5 mmol/L (ref 3.5–5.1)
Sodium: 137 mmol/L (ref 135–145)

## 2016-06-10 SURGERY — IRRIGATION AND DEBRIDEMENT EXTREMITY
Anesthesia: General | Site: Thumb | Laterality: Right

## 2016-06-10 MED ORDER — HYDROMORPHONE HCL 1 MG/ML IJ SOLN
INTRAMUSCULAR | Status: AC
Start: 2016-06-10 — End: 2016-06-10
  Administered 2016-06-10: 0.5 mg via INTRAVENOUS
  Filled 2016-06-10: qty 1

## 2016-06-10 MED ORDER — PROPOFOL 10 MG/ML IV BOLUS
INTRAVENOUS | Status: DC | PRN
  Administered 2016-06-10: 200 mg via INTRAVENOUS
  Administered 2016-06-10: 50 mg via INTRAVENOUS

## 2016-06-10 MED ORDER — OXYCODONE HCL 5 MG PO TABS
5.0000 mg | ORAL_TABLET | Freq: Once | ORAL | Status: AC | PRN
  Administered 2016-06-10: 5 mg via ORAL

## 2016-06-10 MED ORDER — VANCOMYCIN HCL IN DEXTROSE 1-5 GM/200ML-% IV SOLN
1000.0000 mg | Freq: Once | INTRAVENOUS | Status: AC
  Administered 2016-06-10: 1000 mg via INTRAVENOUS

## 2016-06-10 MED ORDER — 0.9 % SODIUM CHLORIDE (POUR BTL) OPTIME
TOPICAL | Status: DC | PRN
  Administered 2016-06-10: 1000 mL

## 2016-06-10 MED ORDER — DOXYCYCLINE HYCLATE 50 MG PO CAPS: 100.0000 mg | ORAL_CAPSULE | Freq: Two times a day (BID) | ORAL | 0 refills | Status: DC

## 2016-06-10 MED ORDER — FENTANYL CITRATE (PF) 250 MCG/5ML IJ SOLN
INTRAMUSCULAR | Status: AC
Start: 2016-06-10 — End: 2016-06-10
  Filled 2016-06-10: qty 5

## 2016-06-10 MED ORDER — OXYCODONE HCL 5 MG/5ML PO SOLN: 5.0000 mg | Freq: Once | ORAL | Status: AC | PRN

## 2016-06-10 MED ORDER — BUPIVACAINE HCL (PF) 0.25 % IJ SOLN
INTRAMUSCULAR | Status: AC
  Filled 2016-06-10: qty 30

## 2016-06-10 MED ORDER — PROPOFOL 10 MG/ML IV BOLUS
INTRAVENOUS | Status: AC
  Filled 2016-06-10: qty 20

## 2016-06-10 MED ORDER — ONDANSETRON HCL 4 MG/2ML IJ SOLN: 4.0000 mg | Freq: Four times a day (QID) | INTRAMUSCULAR | Status: DC | PRN

## 2016-06-10 MED ORDER — FENTANYL CITRATE (PF) 250 MCG/5ML IJ SOLN
INTRAMUSCULAR | Status: DC | PRN
  Administered 2016-06-10: 100 ug via INTRAVENOUS
  Administered 2016-06-10: 50 ug via INTRAVENOUS
  Administered 2016-06-10: 100 ug via INTRAVENOUS

## 2016-06-10 MED ORDER — ONDANSETRON HCL 4 MG/2ML IJ SOLN
INTRAMUSCULAR | Status: DC | PRN
  Administered 2016-06-10: 4 mg via INTRAVENOUS

## 2016-06-10 MED ORDER — LIDOCAINE 2% (20 MG/ML) 5 ML SYRINGE
INTRAMUSCULAR | Status: AC
  Filled 2016-06-10: qty 5

## 2016-06-10 MED ORDER — SUCCINYLCHOLINE CHLORIDE 200 MG/10ML IV SOSY
PREFILLED_SYRINGE | INTRAVENOUS | Status: AC
  Filled 2016-06-10: qty 10

## 2016-06-10 MED ORDER — DEXAMETHASONE SODIUM PHOSPHATE 10 MG/ML IJ SOLN
INTRAMUSCULAR | Status: AC
  Filled 2016-06-10: qty 1

## 2016-06-10 MED ORDER — DEXAMETHASONE SODIUM PHOSPHATE 10 MG/ML IJ SOLN
INTRAMUSCULAR | Status: DC | PRN
  Administered 2016-06-10: 10 mg via INTRAVENOUS

## 2016-06-10 MED ORDER — LACTATED RINGERS IV SOLN
INTRAVENOUS | Status: DC | PRN
  Administered 2016-06-10 (×2): via INTRAVENOUS

## 2016-06-10 MED ORDER — HYDROMORPHONE HCL 1 MG/ML IJ SOLN
0.2500 mg | INTRAMUSCULAR | Status: DC | PRN
  Administered 2016-06-10 (×2): 0.5 mg via INTRAVENOUS

## 2016-06-10 MED ORDER — OXYCODONE HCL 5 MG PO TABS
ORAL_TABLET | ORAL | Status: AC
  Administered 2016-06-10: 5 mg via ORAL
  Filled 2016-06-10: qty 1

## 2016-06-10 MED ORDER — OXYCODONE-ACETAMINOPHEN 5-325 MG PO TABS
ORAL_TABLET | ORAL | Status: AC
  Administered 2016-06-10: 1
  Filled 2016-06-10: qty 1

## 2016-06-10 MED ORDER — LIDOCAINE HCL (CARDIAC) 20 MG/ML IV SOLN
INTRAVENOUS | Status: DC | PRN
  Administered 2016-06-10: 100 mg via INTRATRACHEAL

## 2016-06-10 MED ORDER — VANCOMYCIN HCL IN DEXTROSE 1-5 GM/200ML-% IV SOLN
INTRAVENOUS | Status: AC
  Administered 2016-06-10: 1000 mg via INTRAVENOUS
  Filled 2016-06-10: qty 200

## 2016-06-10 MED ORDER — OXYCODONE-ACETAMINOPHEN 5-325 MG PO TABS: ORAL_TABLET | ORAL | 0 refills | Status: DC

## 2016-06-10 MED ORDER — LACTATED RINGERS IV SOLN
INTRAVENOUS | Status: DC
Start: 2016-06-10 — End: 2016-06-11
  Administered 2016-06-10: 19:00:00 via INTRAVENOUS

## 2016-06-10 MED ORDER — SODIUM CHLORIDE 0.9 % IR SOLN
Status: DC | PRN
  Administered 2016-06-10: 1000 mL

## 2016-06-10 MED ORDER — ROCURONIUM BROMIDE 50 MG/5ML IV SOLN
INTRAVENOUS | Status: AC
  Filled 2016-06-10: qty 1

## 2016-06-10 MED ORDER — ONDANSETRON HCL 4 MG/2ML IJ SOLN
INTRAMUSCULAR | Status: AC
  Filled 2016-06-10: qty 2

## 2016-06-10 MED ORDER — SUCCINYLCHOLINE CHLORIDE 20 MG/ML IJ SOLN
INTRAMUSCULAR | Status: DC | PRN
  Administered 2016-06-10: 100 mg via INTRAVENOUS

## 2016-06-10 SURGICAL SUPPLY — 52 items
BANDAGE ACE 3X5.8 VEL STRL LF (GAUZE/BANDAGES/DRESSINGS) ×3 IMPLANT
BANDAGE COBAN STERILE 2 (GAUZE/BANDAGES/DRESSINGS) IMPLANT
BANDAGE ELASTIC 3 VELCRO ST LF (GAUZE/BANDAGES/DRESSINGS) IMPLANT
BANDAGE ELASTIC 4 VELCRO ST LF (GAUZE/BANDAGES/DRESSINGS) IMPLANT
BNDG COHESIVE 1X5 TAN STRL LF (GAUZE/BANDAGES/DRESSINGS) IMPLANT
BNDG CONFORM 2 STRL LF (GAUZE/BANDAGES/DRESSINGS) ×3 IMPLANT
BNDG ESMARK 4X9 LF (GAUZE/BANDAGES/DRESSINGS) ×3 IMPLANT
BNDG GAUZE ELAST 4 BULKY (GAUZE/BANDAGES/DRESSINGS) ×3 IMPLANT
CORDS BIPOLAR (ELECTRODE) ×3 IMPLANT
COVER SURGICAL LIGHT HANDLE (MISCELLANEOUS) ×6 IMPLANT
DECANTER SPIKE VIAL GLASS SM (MISCELLANEOUS) ×3 IMPLANT
DRAIN PENROSE 1/4X12 LTX STRL (WOUND CARE) IMPLANT
DRSG ADAPTIC 3X8 NADH LF (GAUZE/BANDAGES/DRESSINGS) IMPLANT
DRSG EMULSION OIL 3X3 NADH (GAUZE/BANDAGES/DRESSINGS) IMPLANT
DRSG PAD ABDOMINAL 8X10 ST (GAUZE/BANDAGES/DRESSINGS) IMPLANT
GAUZE SPONGE 4X4 12PLY STRL (GAUZE/BANDAGES/DRESSINGS) ×3 IMPLANT
GAUZE XEROFORM 1X8 LF (GAUZE/BANDAGES/DRESSINGS) IMPLANT
GLOVE BIO SURGEON STRL SZ7.5 (GLOVE) ×3 IMPLANT
GLOVE BIOGEL PI IND STRL 8 (GLOVE) ×1 IMPLANT
GLOVE BIOGEL PI INDICATOR 8 (GLOVE) ×2
GOWN STRL REUS W/ TWL LRG LVL3 (GOWN DISPOSABLE) ×2 IMPLANT
GOWN STRL REUS W/TWL LRG LVL3 (GOWN DISPOSABLE) ×4
KIT BASIN OR (CUSTOM PROCEDURE TRAY) ×3 IMPLANT
KIT ROOM TURNOVER OR (KITS) ×3 IMPLANT
LOOP VESSEL MAXI BLUE (MISCELLANEOUS) IMPLANT
MANIFOLD NEPTUNE II (INSTRUMENTS) ×3 IMPLANT
NEEDLE HYPO 25X1 1.5 SAFETY (NEEDLE) ×3 IMPLANT
NS IRRIG 1000ML POUR BTL (IV SOLUTION) ×3 IMPLANT
PACK ORTHO EXTREMITY (CUSTOM PROCEDURE TRAY) ×3 IMPLANT
PAD ARMBOARD 7.5X6 YLW CONV (MISCELLANEOUS) ×6 IMPLANT
PADDING CAST ABS 4INX4YD NS (CAST SUPPLIES) ×2
PADDING CAST ABS COTTON 4X4 ST (CAST SUPPLIES) ×1 IMPLANT
SCRUB BETADINE 4OZ XXX (MISCELLANEOUS) ×3 IMPLANT
SET CYSTO W/LG BORE CLAMP LF (SET/KITS/TRAYS/PACK) ×3 IMPLANT
SOLUTION BETADINE 4OZ (MISCELLANEOUS) ×3 IMPLANT
SPLINT PLASTER EXTRA FAST 3X15 (CAST SUPPLIES) ×2
SPLINT PLASTER GYPS XFAST 3X15 (CAST SUPPLIES) ×1 IMPLANT
SPONGE LAP 4X18 X RAY DECT (DISPOSABLE) IMPLANT
SUT ETHILON 4 0 P 3 18 (SUTURE) IMPLANT
SUT ETHILON 4 0 PS 2 18 (SUTURE) IMPLANT
SUT MON AB 5-0 P3 18 (SUTURE) IMPLANT
SWAB COLLECTION DEVICE MRSA (MISCELLANEOUS) ×3 IMPLANT
SYR CONTROL 10ML LL (SYRINGE) ×3 IMPLANT
TOWEL OR 17X24 6PK STRL BLUE (TOWEL DISPOSABLE) ×3 IMPLANT
TOWEL OR 17X26 10 PK STRL BLUE (TOWEL DISPOSABLE) ×3 IMPLANT
TUBE ANAEROBIC SPECIMEN COL (MISCELLANEOUS) ×3 IMPLANT
TUBE CONNECTING 12'X1/4 (SUCTIONS) ×1
TUBE CONNECTING 12X1/4 (SUCTIONS) ×2 IMPLANT
TUBE FEEDING 5FR 15 INCH (TUBING) ×3 IMPLANT
UNDERPAD 30X30 INCONTINENT (UNDERPADS AND DIAPERS) ×3 IMPLANT
WATER STERILE IRR 1000ML POUR (IV SOLUTION) ×3 IMPLANT
YANKAUER SUCT BULB TIP NO VENT (SUCTIONS) ×3 IMPLANT

## 2016-06-10 NOTE — Progress Notes (Signed)
Per Thayer Jew in blood bank, since pt's chart has been flagged regarding blood products refusal ([employer]), no need to have pt sign refusal form again.

## 2016-06-10 NOTE — Anesthesia Preprocedure Evaluation (Signed)
Anesthesia Evaluation  Patient identified by MRN, date of birth, ID band Patient awake    Reviewed: Allergy & Precautions, NPO status , Patient's Chart, lab work & pertinent test results  History of Anesthesia Complications (+) PONV and history of anesthetic complications  Airway Mallampati: II   Neck ROM: full    Dental   Pulmonary former smoker,    breath sounds clear to auscultation       Cardiovascular hypertension,  Rhythm:regular Rate:Normal     Neuro/Psych    GI/Hepatic GERD  ,  Endo/Other    Renal/GU      Musculoskeletal  (+) Arthritis ,   Abdominal   Peds  Hematology   Anesthesia Other Findings   Reproductive/Obstetrics                            Anesthesia Physical Anesthesia Plan  ASA: II  Anesthesia Plan: General   Post-op Pain Management:    Induction: Intravenous  Airway Management Planned: Oral ETT  Additional Equipment:   Intra-op Plan:   Post-operative Plan: Extubation in OR  Informed Consent: I have reviewed the patients History and Physical, chart, labs and discussed the procedure including the risks, benefits and alternatives for the proposed anesthesia with the patient or authorized representative who has indicated his/her understanding and acceptance.     Plan Discussed with: CRNA, Anesthesiologist and Surgeon  Anesthesia Plan Comments:         Anesthesia Quick Evaluation

## 2016-06-10 NOTE — Brief Op Note (Signed)
06/10/2016  10:32 PM  PATIENT:  Kathleen Wiley  70 y.o. female  PRE-OPERATIVE DIAGNOSIS:  infection of thumb  POST-OPERATIVE DIAGNOSIS:  infection of thumb  PROCEDURE:  Procedure(s): IRRIGATION AND DEBRIDEMENT THUMB FLEXOR SHEATH (Right)  SURGEON:  Surgeon(s) and Role:    * Leanora Cover, MD - Primary  PHYSICIAN ASSISTANT:   ASSISTANTS: none   ANESTHESIA:   general  EBL:  Total I/O In: 1000 [I.V.:1000] Out: -   BLOOD ADMINISTERED:none  DRAINS: feeding tube drain and iodoform packing  LOCAL MEDICATIONS USED:  NONE  SPECIMEN:  Source of Specimen:  right thumb  DISPOSITION OF SPECIMEN:  micro  COUNTS:  YES  TOURNIQUET:   Total Tourniquet Time Documented: Upper Arm (Right) - 25 minutes Total: Upper Arm (Right) - 25 minutes   DICTATION: .Other Dictation: Dictation Number [id number]  PLAN OF CARE: Discharge to home after PACU  PATIENT DISPOSITION:  PACU - hemodynamically stable.

## 2016-06-10 NOTE — Op Note (Signed)
Kathleen Wiley, Kathleen Wiley NO.:  [id number]  MEDICAL RECORD NO.:  [id number]  LOCATION:  [facility]                         FACILITY:  [facility]  PHYSICIAN:  Leanora Cover, MD        DATE OF BIRTH:  17-Apr-1946  DATE OF PROCEDURE:  06/10/2016 DATE OF DISCHARGE:                              OPERATIVE REPORT   PREOPERATIVE DIAGNOSIS:  Right thumb flexor sheath infection.  POSTOPERATIVE DIAGNOSIS:  Right thumb flexor sheath infection.  PROCEDURE:  Incision and drainage of right thumb flexor tendon sheath including the proximal bursa.  SURGEON:  Leanora Cover, MD.  ASSISTANT:  None.  ANESTHESIA:  General.  IV FLUIDS:  Per anesthesia flow sheet.  ESTIMATED BLOOD LOSS:  Minimal.  COMPLICATIONS:  None.  SPECIMENS:  Cultures to Micro.  TOURNIQUET TIME:  25 minutes.  DISPOSITION:  Stable to PACU.  INDICATIONS:  Kathleen Wiley is a 70 year old, right-hand dominant female, who has been having increasing pain, swelling, and erythema of the right thumb over the past few days.  She noted initially a small wound on the volar surface of the thumb.  She has had some fevers and chills.  She was seen by her primary care physician today and referred to me for further care.  On examination, she had a swollen erythematous thumb that was painful to motion.  I recommended incision and drainage in the operating room.  Risks, benefits, and alternatives of surgery were discussed including risk of blood loss; infection; damage to nerves, vessels, tendons, ligaments, bone; failure of surgery; need for additional surgery; complications with wound healing; continued pain; continued infection; need for repeat irrigation and debridement.  She voiced understanding of these risks and elected to proceed.  OPERATIVE COURSE:  After being identified preoperatively by myself, the patient and I agreed upon procedure and site of procedure.  Surgical site was marked.  Risks, benefits, and alternatives of  surgery were reviewed and she wished to proceed.  Surgical consent had been signed. Antibiotics were held for cultures.  She was transferred to the operating room and placed on the operating room table in supine position with the right upper extremity on an armboard.  General anesthesia was induced by Anesthesiology.  Right upper extremity was prepped and draped in normal sterile orthopedic fashion.  Surgical pause was performed between surgeons, anesthesia, operating staff; and all were in agreement as to the patient, procedure, and site of procedure.  Tourniquet at the proximal aspect of the extremity was inflated to 250 mmHg after exsanguination of limb with Esmarch bandage.  The wound at the volar surface of the thumb was spread.  There was no gross purulence.  It was extended both proximally and distally.  The flexor sheath was accessed distally.  Milking of the thumb produced purulence from within the flexor sheath.  Cultures were taken for aerobes and anaerobes.  An additional incision was made at the proximal flexion crease of the thumb.  The radial and ulnar digital nerves were protected after being identified.  The sheath was accessed too as well.  #5 pediatric feeding tube was advanced into the sheath.  Copious sterile saline was irrigated through the sheath.  Good effluent  was obtained.  Milking of the thenar eminence produced some purulence from within the flexor canal from proximally.  Incision was made at the wrist.  The FPL tendon was identified here.  The feeding tube was advanced retrograde from the wound at the proximal flexion crease into the thumb sheath.  This was then used to provide copious sterile saline irrigation tube.  Good effluent was obtained from the proximal wound.  The feeding tube was then readvanced into the thumb, where the majority of the purulence and infection were located.  It was left in as a drain.  All wounds were packed with quarter-inch  iodoform gauze.  They were dressed with sterile 4x4s and wrapped with a Kerlix bandage lightly.  A thumb spica splint was placed and wrapped lightly with a Kerlix and Ace bandage. Tourniquet was deflated at 25 minutes.  Fingertips were pink with brisk capillary refill after deflation of tourniquet.  Operative drapes were broken down.  The patient was awoken from anesthesia safely.  She was transferred back to stretcher and taken to PACU in stable condition.  I will see her back in the office tomorrow to remove the feeding tube drain.  She will be given a dose of IV vancomycin as coverage for her infection prior to discharge.  I will give her Percocet 5/325, 1-2 p.o. q.6 hours p.r.n. pain, dispense #30; and doxycycline 100 mg p.o. b.i.d. x7 days.     Leanora Cover, MD     KK/MEDQ  D:  06/10/2016  T:  06/10/2016  Job:  [id number]

## 2016-06-10 NOTE — Transfer of Care (Signed)
Immediate Anesthesia Transfer of Care Note  Patient: Kathleen Wiley  Procedure(s) Performed: Procedure(s): IRRIGATION AND DEBRIDEMENT THUMB FLEXOR SHEATH (Right)  Patient Location: PACU  Anesthesia Type:General  Level of Consciousness: awake, alert , oriented, patient cooperative and responds to stimulation  Airway & Oxygen Therapy: Patient Spontanous Breathing and Patient connected to nasal cannula oxygen  Post-op Assessment: Report given to RN, Post -op Vital signs reviewed and stable and Patient moving all extremities X 4  Post vital signs: Reviewed and stable  Last Vitals:  Vitals:   06/10/16 1837 06/10/16 2245  BP: 128/63 (!) 116/52  Pulse: 75 83  Resp: 18 (!) 22  Temp: 37.3 C 37.1 C    Last Pain:  Vitals:   06/10/16 1837  TempSrc: Oral  PainSc: 6       Patients Stated Pain Goal: 3 ([id number])  Complications: No apparent anesthesia complications

## 2016-06-10 NOTE — Op Note (Signed)
[id number] 

## 2016-06-10 NOTE — Progress Notes (Signed)
Pt given Incentive Spirometer.  Sats dropping sporadically into the 80's.  Able to perform good Incentive spirometry.  Will continue to monitor.

## 2016-06-10 NOTE — Discharge Instructions (Signed)

## 2016-06-10 NOTE — H&P (Signed)
Kathleen Wiley is an 70 y.o. female.   Chief Complaint: right thumb infection HPI: 70 yo rhd female state she began having problems with right thumb yesterday with increasing swelling, erythema, and pain.  She noted a small wound at the volar aspect of the distal phalanx two days ago but does not know where she got it.  She has had fevers and chills.  She describes a throbbing pain that is 8/10 in severity.  It is alleviated with rest and worsened with motion or palpation.  Xrays viewed and interpreted by me: AP and lateral views of thumb show no fracture, dislocation, radioopaque foreign body.  Labs reviewed: WBC   Allergies:  Allergies  Allergen Reactions  . Other     Refuses whole blood but does accept albumin  . Metronidazole Other (See Comments)    Chest pain, A-fib   . Septra [Sulfamethoxazole-Trimethoprim] Other (See Comments)    Blisters     Past Medical History:  Diagnosis Date  . Arthritis    knee, hip  . Complication of anesthesia   . Dysrhythmia    Afib due to Flagyl- 7 -2007- not problems since  . GERD (gastroesophageal reflux disease)   . Heart murmur    "slight "  informed years ago.-   . Hernia, umbilical   . History of atrial fibrillation 2007   with flagyl  . History of endometriosis   . Hypercholesteremia   . Hypertension    has not been to a cardiologist  . PONV (postoperative nausea and vomiting)   . Refusal of blood transfusions as patient is Jehovah's Witness     Past Surgical History:  Procedure Laterality Date  . APPENDECTOMY    . EXPLORATORY LAPAROTOMY  1973   for endometrosis  . JOINT REPLACEMENT     Left Hip  . [place]andquist  . rectovaginal fistula repair  1980  . TOTAL HIP ARTHROPLASTY  05/08/2012   Procedure: TOTAL HIP ARTHROPLASTY;  Surgeon: Kerin Salen, MD;  Location: [facility] [place];  Service: Orthopedics;  Laterality: Left;  . TOTAL KNEE ARTHROPLASTY Right 01/24/2013   Dr Mayer Camel  . TOTAL KNEE ARTHROPLASTY Right 01/24/2013   Procedure: RIGHT TOTAL KNEE ARTHROPLASTY;  Surgeon: Kerin Salen, MD;  Location: [facility];  Service: Orthopedics;  Laterality: Right;    Family History: History reviewed. No pertinent family history.  Social History:   reports that she quit smoking about 34 years ago. She has a 10.00 pack-year smoking history. She has never used smokeless tobacco. She reports that she does not drink alcohol or use drugs.  Medications: Medications Prior to Admission  Medication Sig Dispense Refill  . cholecalciferol (VITAMIN D) 1000 units tablet Take 1,000 Units by mouth daily.    . Cyanocobalamin 2500 MCG TABS Take 1 tablet by mouth daily.    . diphenhydrAMINE (BENADRYL) 25 MG tablet Take 25 mg by mouth at bedtime.     Marland Kitchen gemfibrozil (LOPID) 600 MG tablet Take 600 mg by mouth 2 (two) times daily before a meal.    . ibuprofen (ADVIL,MOTRIN) 800 MG tablet Take 1 tablet (800 mg total) by mouth 3 (three) times daily. (Patient taking differently: Take 800 mg by mouth 3 (three) times daily as needed for mild pain or moderate pain. ) 21 tablet 0  . omeprazole (PRILOSEC) 20 MG capsule Take 20 mg by mouth daily.    . traMADol (ULTRAM) 50 MG tablet Take 50 mg by mouth every morning. Patient states she takes  every morning     . triamterene-hydrochlorothiazide (MAXZIDE) 75-50 MG per tablet Take 1 tablet by mouth daily.      Results for orders placed or performed during the hospital encounter of 06/10/16 (from the past 48 hour(s))  Basic metabolic panel     Status: Abnormal   Collection Time: 06/10/16  6:15 PM  Result Value Ref Range   Sodium 137 135 - 145 mmol/L   Potassium 3.5 3.5 - 5.1 mmol/L   Chloride 101 101 - 111 mmol/L   CO2 28 22 - 32 mmol/L   Glucose, Bld 129 (H) 65 - 99 mg/dL   BUN 12 6 - 20 mg/dL   Creatinine, Ser 0.75 0.44 - 1.00 mg/dL   Calcium 9.2 8.9 - 10.3 mg/dL   GFR calc non Af Amer >60 >60 mL/min   GFR calc Af Amer >60 >60 mL/min    Comment: (NOTE) The eGFR has been calculated using the CKD  EPI equation. This calculation has not been validated in all clinical situations. eGFR's persistently <60 mL/min signify possible Chronic Kidney Disease.    Anion gap 8 5 - 15  CBC     Status: None   Collection Time: 06/10/16  6:15 PM  Result Value Ref Range   WBC 5.9 4.0 - 10.5 K/uL   RBC 4.08 3.87 - 5.11 MIL/uL   Hemoglobin 13.2 12.0 - 15.0 g/dL   HCT 40.2 36.0 - 46.0 %   MCV 98.5 78.0 - 100.0 fL   MCH 32.4 26.0 - 34.0 pg   MCHC 32.8 30.0 - 36.0 g/dL   RDW 13.4 11.5 - 15.5 %   Platelets 201 150 - 400 K/uL    No results found.   A comprehensive review of systems was negative except for: Constitutional: positive for chills and fevers Review of Systems: No chest pain, shortness of breath, nausea, vomiting, diarrhea, constipation, easy bleeding or bruising, headaches, dizziness, vision changes, fainting.   Blood pressure 128/63, pulse 75, temperature 99.1 F (37.3 C), temperature source Oral, resp. rate 18, height '5\' 4"'  (1.626 m), weight 107.5 kg (237 lb), SpO2 98 %.  General appearance: alert, cooperative and appears stated age Head: Normocephalic, without obvious abnormality, atraumatic Neck: supple, symmetrical, trachea midline Resp: clear to auscultation bilaterally Cardio: regular rate and rhythm GI: non-tender Extremities: Intact sensation and capillary refill all digits.  +epl/fpl/io.  Small wound at volar distal phalanx of right thumb near ip crease.  Swelling and erythema of thumb.  Tender to palpation especially over proximal phalanx.  Pain with motion of thumb.  Mild tenderness at thenar eminence.  No tenderness at wrist.  No proximal streaking.  No erythema of thenar eminence. Pulses: 2+ and symmetric Skin: Skin color, texture, turgor normal. No rashes or lesions Neurologic: Grossly normal Incision/Wound: As above  Assessment/Plan Right thumb abscess, likely flexor sheath infection.  Recommend OR for incision and drainage.  Risks, benefits, and alternatives of  surgery were discussed and the patient agrees with the plan of care.   Kamdon Reisig R 06/10/2016, 9:21 PM

## 2016-06-10 NOTE — Anesthesia Procedure Notes (Signed)
Procedure Name: Intubation Date/Time: 06/10/2016 9:51 PM Performed by: Claris Che Pre-anesthesia Checklist: Patient identified, Emergency Drugs available, Suction available, Patient being monitored and Timeout performed Patient Re-evaluated:Patient Re-evaluated prior to inductionOxygen Delivery Method: Circle system utilized Preoxygenation: Pre-oxygenation with 100% oxygen Intubation Type: IV induction, Rapid sequence and Cricoid Pressure applied Laryngoscope Size: Mac and 4 Grade View: Grade II Tube type: Oral Tube size: 7.0 mm Number of attempts: 1 Airway Equipment and Method: Stylet Placement Confirmation: ETT inserted through vocal cords under direct vision,  positive ETCO2 and breath sounds checked- equal and bilateral Secured at: 23 cm Tube secured with: Tape Dental Injury: Teeth and Oropharynx as per pre-operative assessment

## 2016-06-11 ENCOUNTER — Encounter (HOSPITAL_COMMUNITY): Payer: Self-pay | Admitting: Orthopedic Surgery

## 2016-06-11 DIAGNOSIS — M65141 Other infective (teno)synovitis, right hand: Secondary | ICD-10-CM | POA: Diagnosis not present

## 2016-06-14 DIAGNOSIS — M6510 Other infective (teno)synovitis, unspecified site: Secondary | ICD-10-CM | POA: Insufficient documentation

## 2016-06-14 DIAGNOSIS — M79644 Pain in right finger(s): Secondary | ICD-10-CM | POA: Insufficient documentation

## 2016-06-16 LAB — AEROBIC/ANAEROBIC CULTURE (SURGICAL/DEEP WOUND)

## 2016-06-16 LAB — AEROBIC/ANAEROBIC CULTURE W GRAM STAIN (SURGICAL/DEEP WOUND)

## 2016-06-21 NOTE — Anesthesia Postprocedure Evaluation (Signed)
Anesthesia Post Note  Patient: Kathleen Wiley  Procedure(s) Performed: Procedure(s) (LRB): IRRIGATION AND DEBRIDEMENT THUMB FLEXOR SHEATH (Right)  Patient location during evaluation: PACU Anesthesia Type: General Level of consciousness: awake and alert and patient cooperative Pain management: pain level controlled Vital Signs Assessment: post-procedure vital signs reviewed and stable Respiratory status: spontaneous breathing and respiratory function stable Cardiovascular status: stable Anesthetic complications: no    Last Vitals:  Vitals:   06/10/16 2330 06/10/16 2345  BP: 122/72 133/63  Pulse: 77 77  Resp: 16 19  Temp:  36.9 C    Last Pain:  Vitals:   06/10/16 2330  TempSrc:   PainSc: [place]

## 2016-08-08 DIAGNOSIS — L0390 Cellulitis, unspecified: Secondary | ICD-10-CM

## 2016-08-08 HISTORY — DX: Cellulitis, unspecified: L03.90

## 2016-08-09 ENCOUNTER — Encounter (HOSPITAL_COMMUNITY): Payer: Self-pay

## 2016-08-09 ENCOUNTER — Inpatient Hospital Stay (HOSPITAL_COMMUNITY)
Admission: EM | Admit: 2016-08-09 | Discharge: 2016-08-20 | DRG: 603 | Disposition: A | Discharge status: Home / Self Care | Payer: Medicare Other | Attending: Family Medicine | Admitting: Family Medicine

## 2016-08-09 DIAGNOSIS — I10 Essential (primary) hypertension: Secondary | ICD-10-CM | POA: Diagnosis present

## 2016-08-09 DIAGNOSIS — E876 Hypokalemia: Secondary | ICD-10-CM | POA: Diagnosis present

## 2016-08-09 DIAGNOSIS — E785 Hyperlipidemia, unspecified: Secondary | ICD-10-CM | POA: Diagnosis present

## 2016-08-09 DIAGNOSIS — R609 Edema, unspecified: Secondary | ICD-10-CM

## 2016-08-09 DIAGNOSIS — R590 Localized enlarged lymph nodes: Secondary | ICD-10-CM | POA: Diagnosis present

## 2016-08-09 DIAGNOSIS — K5900 Constipation, unspecified: Secondary | ICD-10-CM | POA: Diagnosis present

## 2016-08-09 DIAGNOSIS — L03115 Cellulitis of right lower limb: Secondary | ICD-10-CM

## 2016-08-09 DIAGNOSIS — Z96642 Presence of left artificial hip joint: Secondary | ICD-10-CM | POA: Diagnosis present

## 2016-08-09 DIAGNOSIS — L03116 Cellulitis of left lower limb: Secondary | ICD-10-CM | POA: Diagnosis not present

## 2016-08-09 DIAGNOSIS — Z888 Allergy status to other drugs, medicaments and biological substances status: Secondary | ICD-10-CM

## 2016-08-09 DIAGNOSIS — Z87891 Personal history of nicotine dependence: Secondary | ICD-10-CM

## 2016-08-09 DIAGNOSIS — D649 Anemia, unspecified: Secondary | ICD-10-CM | POA: Diagnosis present

## 2016-08-09 DIAGNOSIS — I119 Hypertensive heart disease without heart failure: Secondary | ICD-10-CM | POA: Diagnosis present

## 2016-08-09 DIAGNOSIS — K219 Gastro-esophageal reflux disease without esophagitis: Secondary | ICD-10-CM | POA: Diagnosis present

## 2016-08-09 DIAGNOSIS — Z79899 Other long term (current) drug therapy: Secondary | ICD-10-CM

## 2016-08-09 DIAGNOSIS — L0390 Cellulitis, unspecified: Secondary | ICD-10-CM

## 2016-08-09 DIAGNOSIS — Z96651 Presence of right artificial knee joint: Secondary | ICD-10-CM | POA: Diagnosis present

## 2016-08-09 DIAGNOSIS — E7800 Pure hypercholesterolemia, unspecified: Secondary | ICD-10-CM | POA: Diagnosis present

## 2016-08-09 DIAGNOSIS — Z531 Procedure and treatment not carried out because of patient's decision for reasons of belief and group pressure: Secondary | ICD-10-CM | POA: Diagnosis present

## 2016-08-09 LAB — COMPREHENSIVE METABOLIC PANEL
ALBUMIN: 3.6 g/dL (ref 3.5–5.0)
ALK PHOS: 60 U/L (ref 38–126)
ALT: 53 U/L (ref 14–54)
AST: 44 U/L — AB (ref 15–41)
Anion gap: 10 (ref 5–15)
BILIRUBIN TOTAL: 2.7 mg/dL — AB (ref 0.3–1.2)
BUN: 25 mg/dL — AB (ref 6–20)
CALCIUM: 8.7 mg/dL — AB (ref 8.9–10.3)
CO2: 26 mmol/L (ref 22–32)
CREATININE: 0.83 mg/dL (ref 0.44–1.00)
Chloride: 96 mmol/L — ABNORMAL LOW (ref 101–111)
GFR calc Af Amer: >60 mL/min (ref >60)
GLUCOSE: 156 mg/dL — AB (ref 65–99)
POTASSIUM: 3.1 mmol/L — AB (ref 3.5–5.1)
Sodium: 132 mmol/L — ABNORMAL LOW (ref 135–145)
TOTAL PROTEIN: 7.4 g/dL (ref 6.5–8.1)

## 2016-08-09 LAB — CBC
HEMATOCRIT: 36.5 % (ref 36.0–46.0)
Hemoglobin: 12.4 g/dL (ref 12.0–15.0)
MCH: 32.8 pg (ref 26.0–34.0)
MCHC: 34 g/dL (ref 30.0–36.0)
MCV: 96.6 fL (ref 78.0–100.0)
PLATELETS: 154 10*3/uL (ref 150–400)
RBC: 3.78 MIL/uL — ABNORMAL LOW (ref 3.87–5.11)
RDW: 13.9 % (ref 11.5–15.5)
WBC: 7.8 10*3/uL (ref 4.0–10.5)

## 2016-08-09 LAB — LIPASE, BLOOD: Lipase: 22 U/L (ref 11–51)

## 2016-08-09 MED ORDER — ONDANSETRON 4 MG PO TBDP
4.0000 mg | ORAL_TABLET | Freq: Once | ORAL | Status: AC | PRN
  Administered 2016-08-09: 4 mg via ORAL
  Filled 2016-08-09: qty 1

## 2016-08-09 NOTE — ED Notes (Signed)
Informed the pt that a urine specimen is needed. 

## 2016-08-09 NOTE — ED Triage Notes (Signed)
PT C/O LEFT LEG SWELLING, REDNESS, AND PAIN UPON WAKING ON Sunday. PT ALSO STS SHE BEGAN SHIVERING UNCONTROLLABLY. TODAY SHE STARTED VOMITING. DENIES INJURY.

## 2016-08-10 ENCOUNTER — Emergency Department (HOSPITAL_COMMUNITY): Payer: Medicare Other

## 2016-08-10 DIAGNOSIS — E876 Hypokalemia: Secondary | ICD-10-CM | POA: Diagnosis not present

## 2016-08-10 DIAGNOSIS — Z888 Allergy status to other drugs, medicaments and biological substances status: Secondary | ICD-10-CM | POA: Diagnosis not present

## 2016-08-10 DIAGNOSIS — D649 Anemia, unspecified: Secondary | ICD-10-CM | POA: Diagnosis not present

## 2016-08-10 DIAGNOSIS — Z531 Procedure and treatment not carried out because of patient's decision for reasons of belief and group pressure: Secondary | ICD-10-CM | POA: Diagnosis present

## 2016-08-10 DIAGNOSIS — Z96651 Presence of right artificial knee joint: Secondary | ICD-10-CM | POA: Diagnosis present

## 2016-08-10 DIAGNOSIS — R609 Edema, unspecified: Secondary | ICD-10-CM | POA: Diagnosis not present

## 2016-08-10 DIAGNOSIS — L03115 Cellulitis of right lower limb: Secondary | ICD-10-CM | POA: Diagnosis present

## 2016-08-10 DIAGNOSIS — D72819 Decreased white blood cell count, unspecified: Secondary | ICD-10-CM | POA: Diagnosis not present

## 2016-08-10 DIAGNOSIS — K219 Gastro-esophageal reflux disease without esophagitis: Secondary | ICD-10-CM | POA: Diagnosis present

## 2016-08-10 DIAGNOSIS — E7800 Pure hypercholesterolemia, unspecified: Secondary | ICD-10-CM | POA: Diagnosis present

## 2016-08-10 DIAGNOSIS — I119 Hypertensive heart disease without heart failure: Secondary | ICD-10-CM | POA: Diagnosis present

## 2016-08-10 DIAGNOSIS — L03116 Cellulitis of left lower limb: Secondary | ICD-10-CM | POA: Diagnosis not present

## 2016-08-10 DIAGNOSIS — E785 Hyperlipidemia, unspecified: Secondary | ICD-10-CM | POA: Diagnosis not present

## 2016-08-10 DIAGNOSIS — Z87891 Personal history of nicotine dependence: Secondary | ICD-10-CM | POA: Diagnosis not present

## 2016-08-10 DIAGNOSIS — Z96642 Presence of left artificial hip joint: Secondary | ICD-10-CM | POA: Diagnosis present

## 2016-08-10 DIAGNOSIS — I10 Essential (primary) hypertension: Secondary | ICD-10-CM

## 2016-08-10 DIAGNOSIS — Z79899 Other long term (current) drug therapy: Secondary | ICD-10-CM | POA: Diagnosis not present

## 2016-08-10 DIAGNOSIS — K5900 Constipation, unspecified: Secondary | ICD-10-CM | POA: Diagnosis present

## 2016-08-10 DIAGNOSIS — R590 Localized enlarged lymph nodes: Secondary | ICD-10-CM | POA: Diagnosis present

## 2016-08-10 LAB — BASIC METABOLIC PANEL
Anion gap: 8 (ref 5–15)
BUN: 21 mg/dL — ABNORMAL HIGH (ref 6–20)
CALCIUM: 8 mg/dL — AB (ref 8.9–10.3)
CO2: 27 mmol/L (ref 22–32)
CREATININE: 0.85 mg/dL (ref 0.44–1.00)
Chloride: 100 mmol/L — ABNORMAL LOW (ref 101–111)
Glucose, Bld: 177 mg/dL — ABNORMAL HIGH (ref 65–99)
Potassium: 3 mmol/L — ABNORMAL LOW (ref 3.5–5.1)
SODIUM: 135 mmol/L (ref 135–145)

## 2016-08-10 LAB — URINALYSIS, ROUTINE W REFLEX MICROSCOPIC
GLUCOSE, UA: NEGATIVE mg/dL
Hgb urine dipstick: NEGATIVE
KETONES UR: NEGATIVE mg/dL
NITRITE: NEGATIVE
PH: 6 (ref 5.0–8.0)
PROTEIN: 30 mg/dL — AB
Specific Gravity, Urine: 1.022 (ref 1.005–1.030)

## 2016-08-10 LAB — URINE MICROSCOPIC-ADD ON
BACTERIA UA: NONE SEEN
RBC / HPF: NONE SEEN RBC/hpf (ref 0–5)

## 2016-08-10 LAB — I-STAT CG4 LACTIC ACID, ED: Lactic Acid, Venous: 1.13 mmol/L (ref 0.5–1.9)

## 2016-08-10 IMAGING — CR DG CHEST 2V
2 series · 2 of 2 positions shown · non-contrast
Comparison: [DATE]

CLINICAL DATA: Fever and left leg swelling for 24 hours. Shortness
of breath.

EXAM:
CHEST  2 VIEW

[w chest lat]
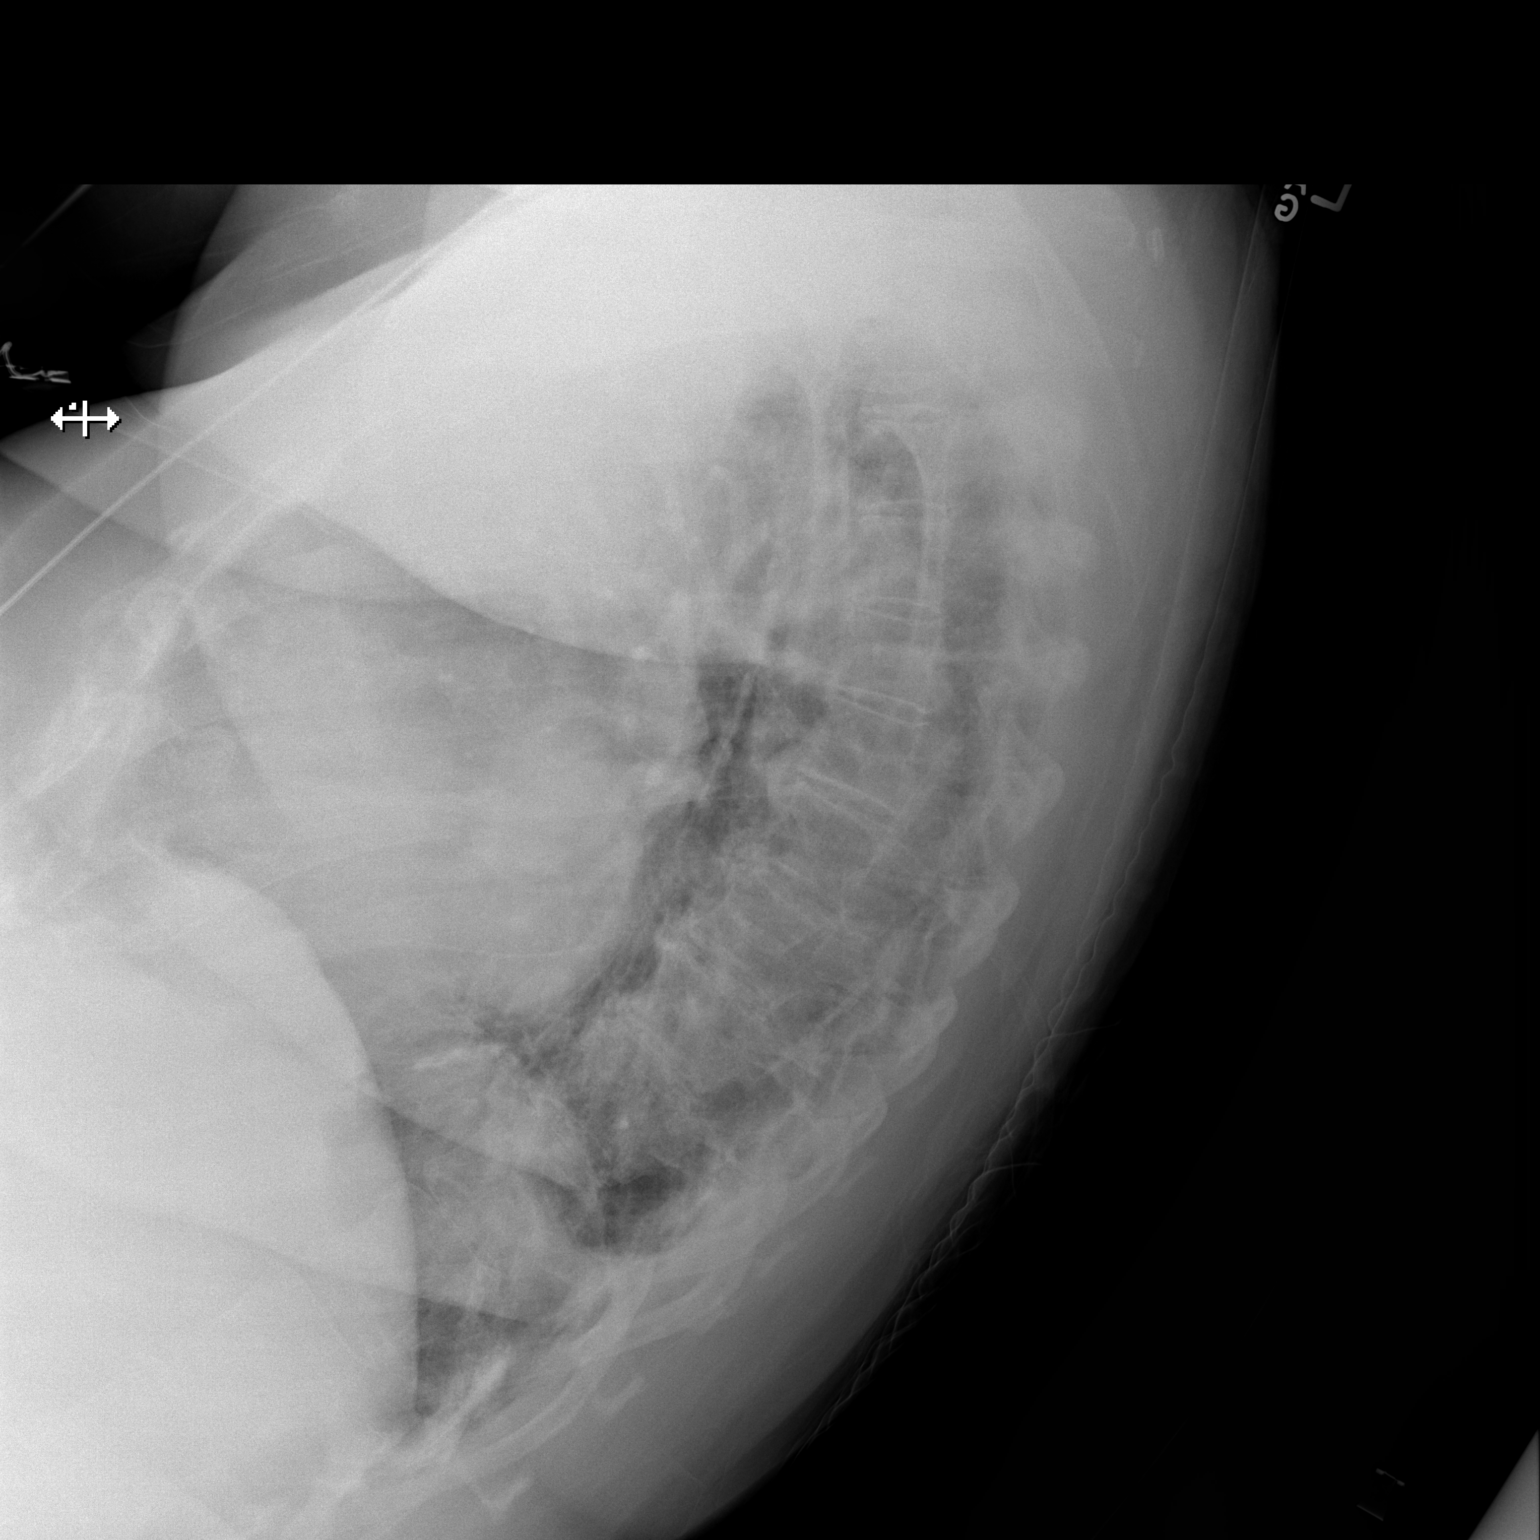

[x chest ap]
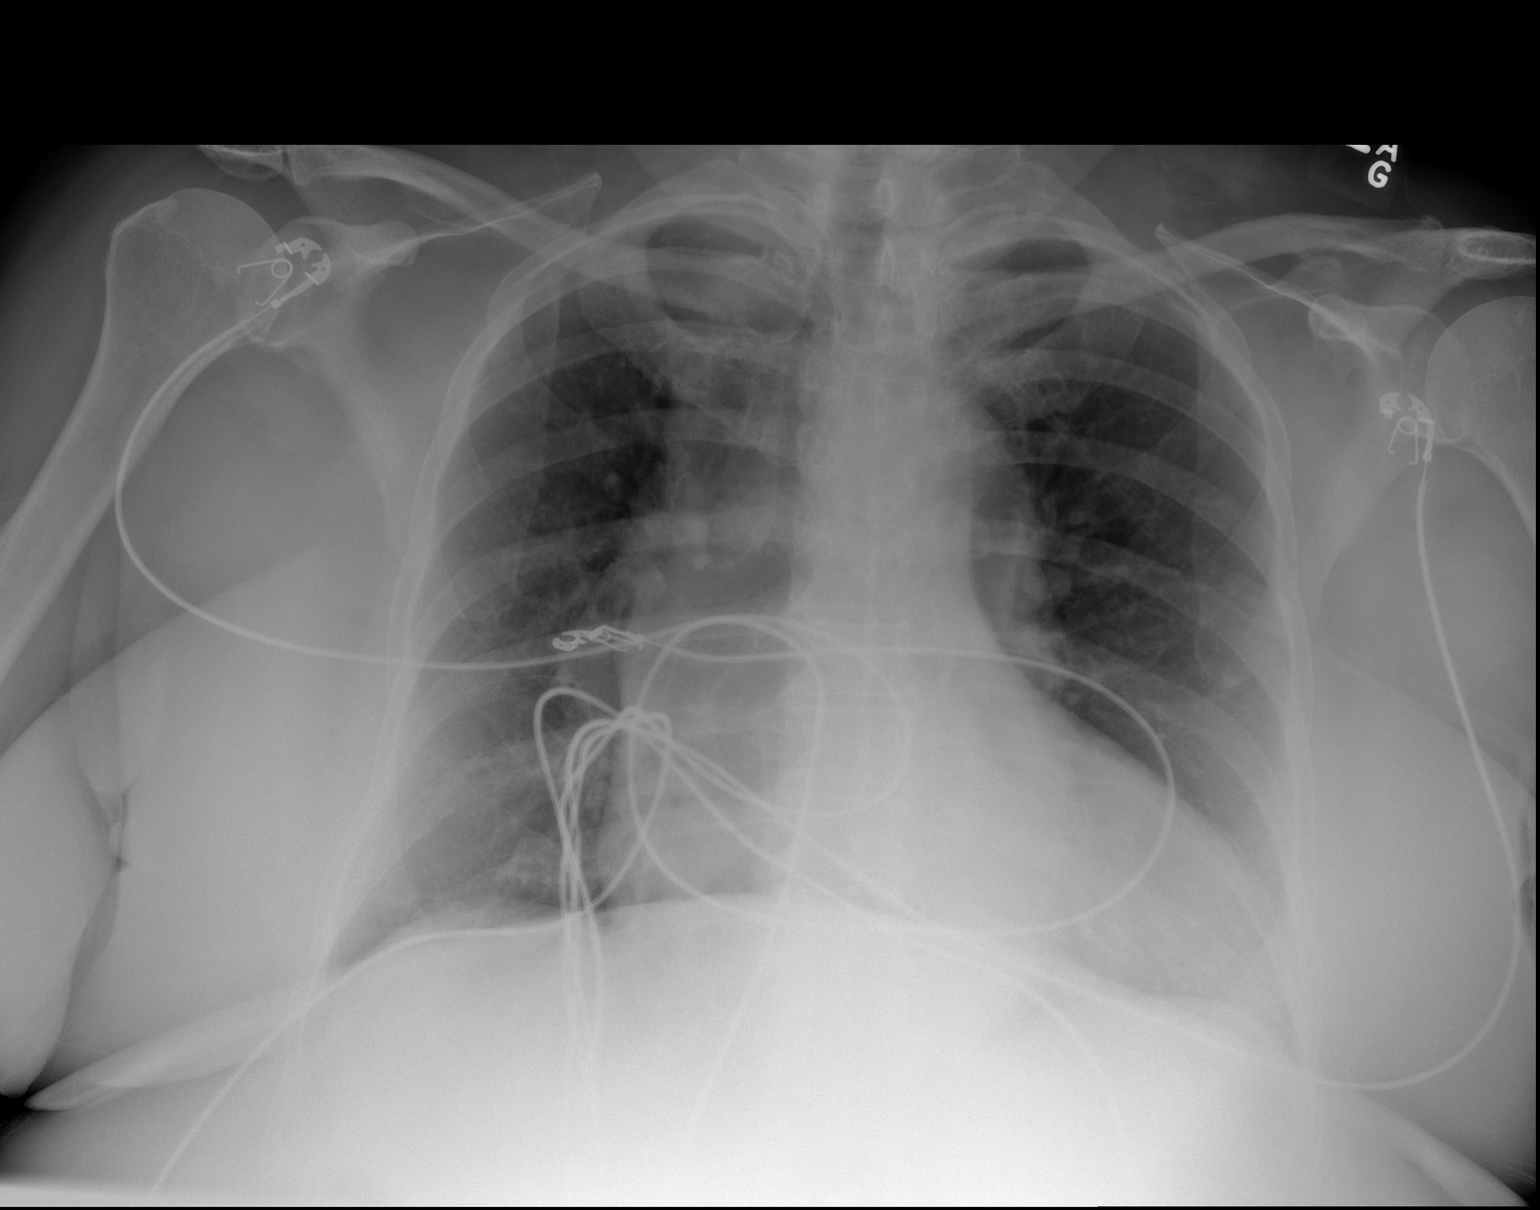

[2 of 2 positions shown; findings below may reference images not displayed]

FINDINGS: Shallow inspiration. Cardiac enlargement. Pulmonary vascularity is
normal. Lungs are clear. No focal airspace disease or consolidation.
No blunting of costophrenic angles. No pneumothorax. Degenerative
changes in the spine.
IMPRESSION: Cardiac enlargement.  No evidence of active pulmonary disease.

## 2016-08-10 MED ORDER — PIPERACILLIN-TAZOBACTAM 3.375 G IVPB 30 MIN
3.3750 g | Freq: Once | INTRAVENOUS | Status: AC
  Administered 2016-08-10: 3.375 g via INTRAVENOUS
  Filled 2016-08-10: qty 50

## 2016-08-10 MED ORDER — ENOXAPARIN SODIUM 40 MG/0.4ML ~~LOC~~ SOLN
40.0000 mg | SUBCUTANEOUS | Status: DC
  Administered 2016-08-10 – 2016-08-20 (×11): 40 mg via SUBCUTANEOUS
  Filled 2016-08-10 (×11): qty 0.4

## 2016-08-10 MED ORDER — IBUPROFEN 400 MG PO TABS
800.0000 mg | ORAL_TABLET | Freq: Three times a day (TID) | ORAL | Status: DC | PRN
  Administered 2016-08-10 – 2016-08-17 (×10): 800 mg via ORAL
  Filled 2016-08-10 (×10): qty 2

## 2016-08-10 MED ORDER — ALUM & MAG HYDROXIDE-SIMETH 200-200-20 MG/5ML PO SUSP
15.0000 mL | ORAL | Status: DC | PRN
  Administered 2016-08-14: 15 mL via ORAL
  Filled 2016-08-10: qty 30

## 2016-08-10 MED ORDER — ONDANSETRON HCL 4 MG PO TABS
4.0000 mg | ORAL_TABLET | Freq: Four times a day (QID) | ORAL | Status: DC | PRN
  Administered 2016-08-12: 4 mg via ORAL
  Filled 2016-08-10: qty 1

## 2016-08-10 MED ORDER — PANTOPRAZOLE SODIUM 40 MG PO TBEC
40.0000 mg | DELAYED_RELEASE_TABLET | Freq: Every day | ORAL | Status: DC
  Administered 2016-08-10 – 2016-08-20 (×11): 40 mg via ORAL
  Filled 2016-08-10 (×12): qty 1

## 2016-08-10 MED ORDER — GEMFIBROZIL 600 MG PO TABS
600.0000 mg | ORAL_TABLET | Freq: Two times a day (BID) | ORAL | Status: DC
  Administered 2016-08-10 – 2016-08-20 (×20): 600 mg via ORAL
  Filled 2016-08-10 (×23): qty 1

## 2016-08-10 MED ORDER — SODIUM CHLORIDE 0.9 % IV SOLN
INTRAVENOUS | Status: DC
  Administered 2016-08-10: 1000 mL via INTRAVENOUS
  Administered 2016-08-10 – 2016-08-13 (×5): via INTRAVENOUS

## 2016-08-10 MED ORDER — ONDANSETRON HCL 4 MG/2ML IJ SOLN
4.0000 mg | Freq: Four times a day (QID) | INTRAMUSCULAR | Status: DC | PRN
  Filled 2016-08-10: qty 2

## 2016-08-10 MED ORDER — DEXTROSE 5 % IV SOLN
1.0000 g | INTRAVENOUS | Status: DC
  Administered 2016-08-10 – 2016-08-11 (×2): 1 g via INTRAVENOUS
  Filled 2016-08-10 (×2): qty 10

## 2016-08-10 MED ORDER — ACETAMINOPHEN 325 MG PO TABS: 650.0000 mg | ORAL_TABLET | Freq: Four times a day (QID) | ORAL | Status: DC | PRN

## 2016-08-10 MED ORDER — VANCOMYCIN HCL IN DEXTROSE 1-5 GM/200ML-% IV SOLN
1000.0000 mg | Freq: Once | INTRAVENOUS | Status: AC
  Administered 2016-08-10: 1000 mg via INTRAVENOUS
  Filled 2016-08-10: qty 200

## 2016-08-10 MED ORDER — SODIUM CHLORIDE 0.9 % IV BOLUS (SEPSIS)
1000.0000 mL | Freq: Once | INTRAVENOUS | Status: AC
  Administered 2016-08-10: 1000 mL via INTRAVENOUS

## 2016-08-10 MED ORDER — POTASSIUM CHLORIDE 10 MEQ/100ML IV SOLN
10.0000 meq | INTRAVENOUS | Status: AC
  Administered 2016-08-10 (×3): 10 meq via INTRAVENOUS
  Filled 2016-08-10 (×3): qty 100

## 2016-08-10 MED ORDER — IBUPROFEN 800 MG PO TABS
800.0000 mg | ORAL_TABLET | Freq: Three times a day (TID) | ORAL | Status: DC | PRN
  Filled 2016-08-10: qty 1

## 2016-08-10 MED ORDER — OXYCODONE-ACETAMINOPHEN 5-325 MG PO TABS
1.0000 | ORAL_TABLET | Freq: Four times a day (QID) | ORAL | Status: DC | PRN
  Administered 2016-08-10 – 2016-08-11 (×6): 1 via ORAL
  Administered 2016-08-12 – 2016-08-13 (×4): 2 via ORAL
  Administered 2016-08-13: 1 via ORAL
  Administered 2016-08-14 – 2016-08-19 (×16): 2 via ORAL
  Administered 2016-08-19 (×2): 1 via ORAL
  Administered 2016-08-20: 2 via ORAL
  Filled 2016-08-10 (×2): qty 2
  Filled 2016-08-10 (×2): qty 1
  Filled 2016-08-10 (×8): qty 2
  Filled 2016-08-10: qty 1
  Filled 2016-08-10 (×4): qty 2
  Filled 2016-08-10: qty 1
  Filled 2016-08-10 (×2): qty 2
  Filled 2016-08-10: qty 1
  Filled 2016-08-10 (×7): qty 2
  Filled 2016-08-10: qty 1
  Filled 2016-08-10: qty 2
  Filled 2016-08-10: qty 1
  Filled 2016-08-10: qty 2
  Filled 2016-08-10: qty 1
  Filled 2016-08-10: qty 2

## 2016-08-10 NOTE — Progress Notes (Signed)
PROGRESS NOTE    Kathleen Wiley  [id number] DOB: Apr 02, 1946 DOA: 08/09/2016 PCP: Lilian Coma, MD   Brief Narrative: 70 y.o. female with medical history significant of GERD, HTN.  Patient presents to the ED with c/o LLE erythema, swelling, pain.  Assessment & Plan:  # Cellulitis of left lower leg: Patient has erythematous rash in left lower extremity consistent with cellulitis. It is warm and tender. Patient had nausea and unable to take oral medication. Pending culture results. Continue IV vancomycin and ceftriaxone today. If improvement in rash by tomorrow, may be able to switch antibiotics to oral. Continue current pain medication.    #Nausea and vomiting: Likely due to acid reflux. Currently on the Protonix. I will add malox. Patient reported similar issues in the past contributing to gas. Advance diet.  #Essential hypertension, benign colon blood pressure borderline low. Currently on IV fluid. Holding oral blood pressure medications. Continue to monitor.  #Hypokalemia due to decreased oral intake and vomiting: Replete potassium chloride. Repeat lab in the morning.  DVT prophylaxis: Lovenox Code Status: Full code Family Communication: Patient's husband at bedside. Discussed with him in patients permission. Disposition Plan: Likely discharge home in 1-2 days  Consultants: None  Procedures: None  Antimicrobials: IV vancomycin and ceftriaxone from today. Likely switch to oral antibiotics tomorrow if clinical improvement in lower extremity contrast.  Subjective: Patient was seen and examined at bedside. Reports left lower extremity pain and redness. Pain is improving with current pain medication. Reports nausea but denied vomiting or abdominal pain. No fever or chills today. Denied chest pain or shortness of breath.   Objective: Vitals:   08/10/16 0116 08/10/16 0200 08/10/16 0226 08/10/16 0550  BP: 111/56 110/58 (!) 107/52 (!) 100/50  Pulse: 80 80 79 69  Resp: (!) 29 22  (!) 21 20  Temp:   98.8 F (37.1 C) 99.1 F (37.3 C)  TempSrc:   Oral Oral  SpO2: 94% 99% 97% 97%  Weight:      Height:        Intake/Output Summary (Last 24 hours) at 08/10/16 1636 Last data filed at 08/10/16 1548  Gross per 24 hour  Intake          1712.09 ml  Output             1650 ml  Net            62.09 ml   Filed Weights   08/09/16 2143  Weight: 108.9 kg (240 lb)    Examination:  General exam: Appears calm and comfortable  Respiratory system: Clear to auscultation. Respiratory effort normal. Cardiovascular system: S1 & S2 heard, RRR. No pedal edema. Gastrointestinal system: Abdomen is nondistended, soft and nontender. Normal bowel sounds heard. Central nervous system: Alert and oriented. No focal neurological deficits. Extremities: Symmetric 5 x 5 power. Skin: Left lower extremity erythematous, tender rash up to knee. Warmth on touch. Psychiatry: Judgement and insight appear normal. Mood & affect appropriate.     Data Reviewed: I have personally reviewed following labs and imaging studies  CBC:  Recent Labs Lab 08/09/16 2154  WBC 7.8  HGB 12.4  HCT 36.5  MCV 96.6  PLT 123456   Basic Metabolic Panel:  Recent Labs Lab 08/09/16 2154 08/10/16 0924  NA 132* 135  K 3.1* 3.0*  CL 96* 100*  CO2 26 27  GLUCOSE 156* 177*  BUN 25* 21*  CREATININE 0.83 0.85  CALCIUM 8.7* 8.0*   GFR: Estimated Creatinine Clearance: 78 mL/min (by  C-G formula based on SCr of 0.85 mg/dL). Liver Function Tests:  Recent Labs Lab 08/09/16 2154  AST 44*  ALT 53  ALKPHOS 60  BILITOT 2.7*  PROT 7.4  ALBUMIN 3.6    Recent Labs Lab 08/09/16 2154  LIPASE 22   No results for input(s): AMMONIA in the last 168 hours. Coagulation Profile: No results for input(s): INR, PROTIME in the last 168 hours. Cardiac Enzymes: No results for input(s): CKTOTAL, CKMB, CKMBINDEX, TROPONINI in the last 168 hours. BNP (last 3 results) No results for input(s): PROBNP in the last 8760  hours. HbA1C: No results for input(s): HGBA1C in the last 72 hours. CBG: No results for input(s): GLUCAP in the last 168 hours. Lipid Profile: No results for input(s): CHOL, HDL, LDLCALC, TRIG, CHOLHDL, LDLDIRECT in the last 72 hours. Thyroid Function Tests: No results for input(s): TSH, T4TOTAL, FREET4, T3FREE, THYROIDAB in the last 72 hours. Anemia Panel: No results for input(s): VITAMINB12, FOLATE, FERRITIN, TIBC, IRON, RETICCTPCT in the last 72 hours. Sepsis Labs:  Recent Labs Lab 08/10/16 0042  LATICACIDVEN 1.13    No results found for this or any previous visit (from the past 240 hour(s)).       Radiology Studies: Dg Chest 2 View  Result Date: 08/10/2016 CLINICAL DATA:  Fever and left leg swelling for 24 hours. Shortness of breath. EXAM: CHEST  2 VIEW COMPARISON:  08/06/2012 FINDINGS: Shallow inspiration. Cardiac enlargement. Pulmonary vascularity is normal. Lungs are clear. No focal airspace disease or consolidation. No blunting of costophrenic angles. No pneumothorax. Degenerative changes in the spine. IMPRESSION: Cardiac enlargement.  No evidence of active pulmonary disease. Electronically Signed   By: Lucienne Capers M.D.   On: 08/10/2016 01:22        Scheduled Meds: . cefTRIAXone (ROCEPHIN)  IV  1 g Intravenous Q24H  . enoxaparin (LOVENOX) injection  40 mg Subcutaneous Q24H  . gemfibrozil  600 mg Oral BID AC  . pantoprazole  40 mg Oral Daily  . potassium chloride  10 mEq Intravenous Q1 Hr x 3   Continuous Infusions: . sodium chloride 125 mL/hr at 08/10/16 1000     LOS: 0 days    Time spent: 25 minutes   Dron Tanna Furry, MD [facility] Pager [phone]  If 7PM-7AM, please contact night-coverage [email or web address] Password [email or web address] 08/10/2016, 4:36 PM

## 2016-08-10 NOTE — ED Notes (Signed)
Requested urine from patient. 

## 2016-08-10 NOTE — H&P (Signed)
History and Physical    Kathleen Wiley [id number] DOB: October 08, 1946 DOA: 08/09/2016   PCP: Lilian Coma, MD Chief Complaint:  Chief Complaint  Patient presents with  . Emesis  . Leg Swelling    LEFT    HPI: Kathleen Wiley is a 70 y.o. female with medical history significant of GERD, HTN.  Patient presents to the ED with c/o LLE erythema, swelling, pain.  Symptoms onset yesterday.  Prescribed doxycycline.  Unfortunately she developed fever and vomiting and was unable to keep meds down so she presents to the ED.  ED Course: given zosyn and vanc for LLE cellulitis.  Review of Systems: As per HPI otherwise 10 point review of systems negative.    Past Medical History:  Diagnosis Date  . Arthritis    knee, hip  . Complication of anesthesia   . Dysrhythmia    Afib due to Flagyl- 7 -2007- not problems since  . GERD (gastroesophageal reflux disease)   . Heart murmur    "slight "  informed years ago.-   . Hernia, umbilical   . History of atrial fibrillation 2007   with flagyl  . History of endometriosis   . Hypercholesteremia   . Hypertension    has not been to a cardiologist  . PONV (postoperative nausea and vomiting)   . Refusal of blood transfusions as patient is Jehovah's Witness     Past Surgical History:  Procedure Laterality Date  . APPENDECTOMY    . EXPLORATORY LAPAROTOMY  1973   for endometrosis  . I&D EXTREMITY Right 06/10/2016   Procedure: IRRIGATION AND DEBRIDEMENT THUMB FLEXOR SHEATH;  Surgeon: Leanora Cover, MD;  Location: [facility];  Service: Orthopedics;  Laterality: Right;  . JOINT REPLACEMENT     Left Hip  . [place]  . rectovaginal fistula repair  1980  . TOTAL HIP ARTHROPLASTY  05/08/2012   Procedure: TOTAL HIP ARTHROPLASTY;  Surgeon: Kerin Salen, MD;  Location: [facility];  Service: Orthopedics;  Laterality: Left;  . TOTAL KNEE ARTHROPLASTY Right 01/24/2013   Dr Mayer Camel  . TOTAL KNEE ARTHROPLASTY Right 01/24/2013   Procedure: RIGHT TOTAL KNEE  ARTHROPLASTY;  Surgeon: Kerin Salen, MD;  Location: [facility];  Service: Orthopedics;  Laterality: Right;     reports that she quit smoking about 34 years ago. She has a 10.00 pack-year smoking history. She has never used smokeless tobacco. She reports that she does not drink alcohol or use drugs.  Allergies  Allergen Reactions  . Other     Refuses whole blood but does accept albumin  . Metronidazole Other (See Comments)    Chest pain, A-fib   . Septra [Sulfamethoxazole-Trimethoprim] Other (See Comments)    Blisters     History reviewed. No pertinent family history.    Prior to Admission medications   Medication Sig Start Date End Date Taking? Authorizing Provider  cholecalciferol (VITAMIN D) 1000 units tablet Take 1,000 Units by mouth daily.   Yes Historical Provider, MD  Cyanocobalamin 2500 MCG TABS Take 1 tablet by mouth daily.   Yes Historical Provider, MD  gemfibrozil (LOPID) 600 MG tablet Take 600 mg by mouth 2 (two) times daily before a meal.   Yes Historical Provider, MD  omeprazole (PRILOSEC) 20 MG capsule Take 20 mg by mouth daily.   Yes Historical Provider, MD  traMADol (ULTRAM) 50 MG tablet Take 50 mg by mouth every 6 (six) hours as needed for moderate pain.   Yes Historical Provider, MD  triamterene-hydrochlorothiazide (MAXZIDE) 75-50 MG per tablet Take 1 tablet by mouth daily.   Yes Historical Provider, MD    Physical Exam: Vitals:   08/09/16 2359 08/10/16 0039 08/10/16 0059 08/10/16 0059  BP: 112/67  112/68   Pulse: 80  80 77  Resp: 17  (!) 28 16  Temp:  101.3 F (38.5 C)    TempSrc:  Rectal    SpO2: 92%  (!) 88% 92%  Weight:      Height:          Constitutional: NAD, calm, comfortable Eyes: PERRL, lids and conjunctivae normal ENMT: Mucous membranes are moist. Posterior pharynx clear of any exudate or lesions.Normal dentition.  Neck: normal, supple, no masses, no thyromegaly Respiratory: clear to auscultation bilaterally, no wheezing, no crackles. Normal  respiratory effort. No accessory muscle use.  Cardiovascular: Regular rate and rhythm, no murmurs / rubs / gallops. No extremity edema. 2+ pedal pulses. No carotid bruits.  Abdomen: no tenderness, no masses palpated. No hepatosplenomegaly. Bowel sounds positive.  Musculoskeletal: no clubbing / cyanosis. No joint deformity upper and lower extremities. Good ROM, no contractures. Normal muscle tone.  Skin: Erythema and swelling of LLE Neurologic: CN 2-12 grossly intact. Sensation intact, DTR normal. Strength 5/5 in all 4.  Psychiatric: Normal judgment and insight. Alert and oriented x 3. Normal mood.    Labs on Admission: I have personally reviewed following labs and imaging studies  CBC:  Recent Labs Lab 08/09/16 2154  WBC 7.8  HGB 12.4  HCT 36.5  MCV 96.6  PLT 123456   Basic Metabolic Panel:  Recent Labs Lab 08/09/16 2154  NA 132*  K 3.1*  CL 96*  CO2 26  GLUCOSE 156*  BUN 25*  CREATININE 0.83  CALCIUM 8.7*   GFR: Estimated Creatinine Clearance: 79.9 mL/min (by C-G formula based on SCr of 0.83 mg/dL). Liver Function Tests:  Recent Labs Lab 08/09/16 2154  AST 44*  ALT 53  ALKPHOS 60  BILITOT 2.7*  PROT 7.4  ALBUMIN 3.6    Recent Labs Lab 08/09/16 2154  LIPASE 22   No results for input(s): AMMONIA in the last 168 hours. Coagulation Profile: No results for input(s): INR, PROTIME in the last 168 hours. Cardiac Enzymes: No results for input(s): CKTOTAL, CKMB, CKMBINDEX, TROPONINI in the last 168 hours. BNP (last 3 results) No results for input(s): PROBNP in the last 8760 hours. HbA1C: No results for input(s): HGBA1C in the last 72 hours. CBG: No results for input(s): GLUCAP in the last 168 hours. Lipid Profile: No results for input(s): CHOL, HDL, LDLCALC, TRIG, CHOLHDL, LDLDIRECT in the last 72 hours. Thyroid Function Tests: No results for input(s): TSH, T4TOTAL, FREET4, T3FREE, THYROIDAB in the last 72 hours. Anemia Panel: No results for input(s):  VITAMINB12, FOLATE, FERRITIN, TIBC, IRON, RETICCTPCT in the last 72 hours. Urine analysis:    Component Value Date/Time   COLORURINE AMBER (A) 08/06/2012 2046   APPEARANCEUR CLOUDY (A) 08/06/2012 2046   LABSPEC 1.027 08/06/2012 2046   PHURINE 7.0 08/06/2012 2046   GLUCOSEU NEGATIVE 08/06/2012 2046   HGBUR NEGATIVE 08/06/2012 2046   BILIRUBINUR SMALL (A) 08/06/2012 2046   KETONESUR 15 (A) 08/06/2012 2046   PROTEINUR NEGATIVE 08/06/2012 2046   UROBILINOGEN 1.0 08/06/2012 2046   NITRITE POSITIVE (A) 08/06/2012 2046   LEUKOCYTESUR MODERATE (A) 08/06/2012 2046   Sepsis Labs: @LABRCNTIP (procalcitonin:4,lacticidven:4) )No results found for this or any previous visit (from the past 240 hour(s)).   Radiological Exams on Admission: Dg Chest 2 View  Result Date: 08/10/2016 CLINICAL DATA:  Fever and left leg swelling for 24 hours. Shortness of breath. EXAM: CHEST  2 VIEW COMPARISON:  08/06/2012 FINDINGS: Shallow inspiration. Cardiac enlargement. Pulmonary vascularity is normal. Lungs are clear. No focal airspace disease or consolidation. No blunting of costophrenic angles. No pneumothorax. Degenerative changes in the spine. IMPRESSION: Cardiac enlargement.  No evidence of active pulmonary disease. Electronically Signed   By: Lucienne Capers M.D.   On: 08/10/2016 01:22    EKG: Independently reviewed.  Assessment/Plan Principal Problem:   Cellulitis of left lower leg Active Problems:   Essential hypertension, benign    1. Cellulitis of LLE - 1. Got vanc in ED 2. Cellulitis pathway 3. Will convert to rocephin as per pathway 4. IVF 5. Clear liquid diet 6. zofran for nausea. 2. HTN - holding HCTZ   DVT prophylaxis: Lovenox Code Status: Full Family Communication: Husband at bedside Consults called: None Admission status: Admit to inpatient   Etta Quill DO [facility] Pager [phone] from 7PM-7AM  If 7AM-7PM, please contact the day physician for the  patient [email or web address] Password [email or web address]  08/10/2016, 1:50 AM

## 2016-08-10 NOTE — ED Provider Notes (Signed)
[facility] DEPT Provider Note   CSN: [id number] Arrival date & time: 08/09/16  2131  By signing my name below, I, Gwenlyn Fudge, attest that this documentation has been prepared under the direction and in the presence of Quintella Reichert, MD. Electronically Signed: Gwenlyn Fudge, ED Scribe. 08/10/16. 12:18 AM.   History   Chief Complaint Chief Complaint  Patient presents with  . Emesis  . Leg Swelling    LEFT   The history is provided by the patient. No language interpreter was used.   HPI Comments: Kathleen Wiley is a 70 y.o. female with PMHx of GERD, HTN and endometriosis who presents to the Emergency Department complaining of gradual onset, constant left leg swelling, warmth, and redness onset 1 day. Pt states her leg has not drastically worsened since yesterday. She was seen earlier today at a clinic and was prescribed Keflex. Pt reports associated chills, fever (TMax 102), abdominal pain, generalized body aches, decreased appetite, nausea, vomiting. Pt woke up yesterday morning with chills. She states he has decreased appetite due to vomiting and notes she was unable to take the prescribed antibiotic because she was not able to eat. Pt has no hx of PE/DVT or DM. She travelled to [place] for 3 weeks in July and states she became sick with diarrhea and vomiting. Denies cat bites, dog bites, salt or fresh water exposure. Pt is not currently taking blood thinners.  Past Medical History:  Diagnosis Date  . Arthritis    knee, hip  . Complication of anesthesia   . Dysrhythmia    Afib due to Flagyl- 7 -2007- not problems since  . GERD (gastroesophageal reflux disease)   . Heart murmur    "slight "  informed years ago.-   . Hernia, umbilical   . History of atrial fibrillation 2007   with flagyl  . History of endometriosis   . Hypercholesteremia   . Hypertension    has not been to a cardiologist  . PONV (postoperative nausea and vomiting)   . Refusal of blood transfusions as patient  is Jehovah's Witness     Patient Active Problem List   Diagnosis Date Noted  . GERD (gastroesophageal reflux disease) 02/09/2013  . Essential hypertension, benign 02/09/2013  . Hypercholesterolemia 02/09/2013  . Unspecified constipation 02/09/2013  . Muscle spasm 02/09/2013  . Osteoarthritis of right knee 01/26/2013  . Osteoarthritis of left hip 05/10/2012    Past Surgical History:  Procedure Laterality Date  . APPENDECTOMY    . EXPLORATORY LAPAROTOMY  1973   for endometrosis  . I&D EXTREMITY Right 06/10/2016   Procedure: IRRIGATION AND DEBRIDEMENT THUMB FLEXOR SHEATH;  Surgeon: Leanora Cover, MD;  Location: [facility];  Service: Orthopedics;  Laterality: Right;  . JOINT REPLACEMENT     Left Hip  . [place]  . rectovaginal fistula repair  1980  . TOTAL HIP ARTHROPLASTY  05/08/2012   Procedure: TOTAL HIP ARTHROPLASTY;  Surgeon: Kerin Salen, MD;  Location: [facility];  Service: Orthopedics;  Laterality: Left;  . TOTAL KNEE ARTHROPLASTY Right 01/24/2013   Dr Mayer Camel  . TOTAL KNEE ARTHROPLASTY Right 01/24/2013   Procedure: RIGHT TOTAL KNEE ARTHROPLASTY;  Surgeon: Kerin Salen, MD;  Location: [facility];  Service: Orthopedics;  Laterality: Right;    OB History    No data available       Home Medications    Prior to Admission medications   Medication Sig Start Date End Date Taking? Authorizing Provider  cholecalciferol (VITAMIN D)  1000 units tablet Take 1,000 Units by mouth daily.    Historical Provider, MD  Cyanocobalamin 2500 MCG TABS Take 1 tablet by mouth daily.    Historical Provider, MD  diphenhydrAMINE (BENADRYL) 25 MG tablet Take 25 mg by mouth at bedtime.     Historical Provider, MD  doxycycline (VIBRAMYCIN) 50 MG capsule Take 2 capsules (100 mg total) by mouth 2 (two) times daily. 06/10/16   Leanora Cover, MD  gemfibrozil (LOPID) 600 MG tablet Take 600 mg by mouth 2 (two) times daily before a meal.    Historical Provider, MD  ibuprofen (ADVIL,MOTRIN) 800 MG tablet Take 1  tablet (800 mg total) by mouth 3 (three) times daily. Patient taking differently: Take 800 mg by mouth 3 (three) times daily as needed for mild pain or moderate pain.  04/19/14   Hazel Sams, PA-C  omeprazole (PRILOSEC) 20 MG capsule Take 20 mg by mouth daily.    Historical Provider, MD  oxyCODONE-acetaminophen (PERCOCET) 5-325 MG tablet 1-2 tabs po q6 hours prn pain 06/10/16   Leanora Cover, MD  triamterene-hydrochlorothiazide (MAXZIDE) 75-50 MG per tablet Take 1 tablet by mouth daily.    Historical Provider, MD    Family History History reviewed. No pertinent family history.  Social History Social History  Substance Use Topics  . Smoking status: Former Smoker    Packs/day: 1.00    Years: 10.00    Quit date: 11/08/1981  . Smokeless tobacco: Never Used  . Alcohol use No     Allergies   Other; Metronidazole; and Septra [sulfamethoxazole-trimethoprim]   Review of Systems Review of Systems  Constitutional: Positive for appetite change and fever.  Cardiovascular: Positive for leg swelling.  Gastrointestinal: Positive for abdominal pain, nausea and vomiting.  Skin: Positive for color change.  All other systems reviewed and are negative.   Physical Exam Updated Vital Signs BP 112/67 (BP Location: Left Arm)   Pulse 80   Temp 99 F (37.2 C) (Oral)   Resp 17   Ht 5\' 6"  (1.676 m)   Wt 240 lb (108.9 kg)   SpO2 92%   BMI 38.74 kg/m   Physical Exam  Constitutional: She is oriented to person, place, and time. She appears well-developed and well-nourished.  HENT:  Head: Normocephalic and atraumatic.  Cardiovascular: Normal rate and regular rhythm.   No murmur heard. Pulmonary/Chest: Effort normal and breath sounds normal. No respiratory distress.  Abdominal: Soft. There is no tenderness. There is no rebound and no guarding.  Musculoskeletal: She exhibits no edema or tenderness.  Neurological: She is alert and oriented to person, place, and time.  Skin: Skin is warm and dry.    significant erythema and edema to the left lower extremity from the foot to the knee No abscess  Psychiatric: She has a normal mood and affect. Her behavior is normal.  Nursing note and vitals reviewed.   ED Treatments / Results  DIAGNOSTIC STUDIES: Oxygen Saturation is 92% on RA, low by my interpretation.    COORDINATION OF CARE: 12:11 AM Discussed treatment plan with pt at bedside which includes admission to a hospital and pt agreed to plan.  Labs (all labs ordered are listed, but only abnormal results are displayed) Labs Reviewed  COMPREHENSIVE METABOLIC PANEL - Abnormal; Notable for the following:       Result Value   Sodium 132 (*)    Potassium 3.1 (*)    Chloride 96 (*)    Glucose, Bld 156 (*)    BUN 25 (*)  Calcium 8.7 (*)    AST 44 (*)    Total Bilirubin 2.7 (*)    All other components within normal limits  CBC - Abnormal; Notable for the following:    RBC 3.78 (*)    All other components within normal limits  LIPASE, BLOOD  URINALYSIS, ROUTINE W REFLEX MICROSCOPIC (NOT AT [facility])    EKG  EKG Interpretation None       Radiology No results found.  Procedures Procedures (including critical care time)  Medications Ordered in ED Medications  ondansetron (ZOFRAN-ODT) disintegrating tablet 4 mg (4 mg Oral Given 08/09/16 2154)     Initial Impression / Assessment and Plan / ED Course  I have reviewed the triage vital signs and the nursing notes.  Pertinent labs & imaging results that were available during my care of the patient were reviewed by me and considered in my medical decision making (see chart for details).  Clinical Course    Patient here for evaluation of fever and leg pain. She has significant cellulitis on examination. She started on broad-spectrum antibiotics for concern for developing sepsis. Current exam is not consistent with deep tissue space infection or necrotizing fasciitis. Plan to admit for further treatment.  Final Clinical  Impressions(s) / ED Diagnoses   Final diagnoses:  None   I personally performed the services described in this documentation, which was scribed in my presence. The recorded information has been reviewed and is accurate.   New Prescriptions New Prescriptions   No medications on file     Quintella Reichert, MD 08/10/16 [phone]

## 2016-08-11 ENCOUNTER — Inpatient Hospital Stay (HOSPITAL_COMMUNITY): Payer: Medicare Other

## 2016-08-11 DIAGNOSIS — R609 Edema, unspecified: Secondary | ICD-10-CM

## 2016-08-11 LAB — BASIC METABOLIC PANEL
Anion gap: 7 (ref 5–15)
BUN: 17 mg/dL (ref 6–20)
CHLORIDE: 107 mmol/L (ref 101–111)
CO2: 24 mmol/L (ref 22–32)
Calcium: 8.2 mg/dL — ABNORMAL LOW (ref 8.9–10.3)
Creatinine, Ser: 0.69 mg/dL (ref 0.44–1.00)
GFR calc Af Amer: >60 mL/min (ref >60)
Glucose, Bld: 99 mg/dL (ref 65–99)
Potassium: 3.6 mmol/L (ref 3.5–5.1)
Sodium: 138 mmol/L (ref 135–145)

## 2016-08-11 LAB — URINE CULTURE: CULTURE: NO GROWTH

## 2016-08-11 MED ORDER — PIPERACILLIN-TAZOBACTAM 3.375 G IVPB 30 MIN
3.3750 g | Freq: Once | INTRAVENOUS | Status: AC
  Administered 2016-08-11: 3.375 g via INTRAVENOUS
  Filled 2016-08-11: qty 50

## 2016-08-11 MED ORDER — SODIUM CHLORIDE 0.9 % IV BOLUS (SEPSIS)
500.0000 mL | Freq: Once | INTRAVENOUS | Status: AC
  Administered 2016-08-11: 500 mL via INTRAVENOUS

## 2016-08-11 MED ORDER — PIPERACILLIN-TAZOBACTAM 3.375 G IVPB
3.3750 g | Freq: Three times a day (TID) | INTRAVENOUS | Status: DC
  Administered 2016-08-11 – 2016-08-17 (×18): 3.375 g via INTRAVENOUS
  Filled 2016-08-11 (×19): qty 50

## 2016-08-11 MED ORDER — VANCOMYCIN HCL 10 G IV SOLR
2000.0000 mg | Freq: Once | INTRAVENOUS | Status: AC
  Administered 2016-08-11: 2000 mg via INTRAVENOUS
  Filled 2016-08-11: qty 2000

## 2016-08-11 MED ORDER — VANCOMYCIN HCL IN DEXTROSE 750-5 MG/150ML-% IV SOLN
750.0000 mg | Freq: Two times a day (BID) | INTRAVENOUS | Status: DC
  Administered 2016-08-12 – 2016-08-19 (×15): 750 mg via INTRAVENOUS
  Filled 2016-08-11 (×16): qty 150

## 2016-08-11 MED ORDER — SACCHAROMYCES BOULARDII 250 MG PO CAPS
250.0000 mg | ORAL_CAPSULE | Freq: Two times a day (BID) | ORAL | Status: DC
  Administered 2016-08-11 – 2016-08-20 (×19): 250 mg via ORAL
  Filled 2016-08-11 (×19): qty 1

## 2016-08-11 MED ORDER — LIP MEDEX EX OINT
TOPICAL_OINTMENT | CUTANEOUS | Status: DC | PRN
  Filled 2016-08-11 (×2): qty 7

## 2016-08-11 NOTE — Progress Notes (Signed)
**  Preliminary report by tech**  Bilateral lower extremity venous duplex completed. The study was technically limited due to body habitus, edema, depth of vessels, patient pain tolerance, and acoustic shadow.  There is no evidence of deep or superficial vein thrombosis involving the right lower extremity. Unable to visualize the left peroneal veins. However, all other visualized vessels of the left lower extremity appear patent. There is no evidence of Baker's cysts bilaterally. Incidental findings are consistent with an enlarged lymph node in the left groin area. Results were given to the patient's nurse, [place].  08/11/16 4:55 PM Carlos Levering RVT

## 2016-08-11 NOTE — Progress Notes (Signed)
Pharmacy Antibiotic Note  Kathleen Wiley is a 70 y.o. female admitted on 08/09/2016 with cellulitis. Patient was given one-time doses of vancomycin and zosyn on 10/3, then narrowed to ceftriaxone. Today, 10/4, coverage being broadened back to vancomycin and zosyn per Pharmacy protocol.  Plan:  Vancomycin 2g IV x 1, then 750mg  IV q12h; goal trough 10-15 mcg/ml  Zosyn 3.375gm IV q8h (4hr extended infusions)  Follow up renal function & cultures, de-escalate as appropriate  Height: 5\' 6"  (167.6 cm) Weight: 240 lb (108.9 kg) IBW/kg (Calculated) : 59.3  Temp (24hrs), Avg:98.3 F (36.8 C), Min:97.8 F (36.6 C), Max:98.7 F (37.1 C)   Recent Labs Lab 08/09/16 2154 08/10/16 0042 08/10/16 0924 08/11/16 0440  WBC 7.8  --   --   --   CREATININE 0.83  --  0.85 0.69  LATICACIDVEN  --  1.13  --   --     Estimated Creatinine Clearance: 82.9 mL/min (by C-G formula based on SCr of 0.69 mg/dL).    Allergies  Allergen Reactions  . Other     Refuses whole blood but does accept albumin  . Metronidazole Other (See Comments)    Chest pain, A-fib   . Septra [Sulfamethoxazole-Trimethoprim] Other (See Comments)    Blisters     Antimicrobials this admission:  10/3 Rocephin >> 10/4 10/3 Vanc x 1, resume 10/4 >> 10/3 Zosyn x 1, resume 10/4 >>  Dose adjustments this admission:  ---  Microbiology results:  10/3 BCx: sent 10/3 UCx: NGF   Thank you for allowing pharmacy to be a part of this patient's care.  Peggyann Juba, PharmD, BCPS Pager: [phone] 08/11/2016 11:13 AM

## 2016-08-11 NOTE — Progress Notes (Signed)
[facility] PROGRESS NOTE  Kathleen Wiley [id number] DOB: 1946-06-10 DOA: 08/09/2016  PCP: Lilian Coma, MD  Brief History/Interval Summary: 70 year old Caucasian female with a past medical history of GERD, hypertension, presented with fever, chills and complains of pain in the left leg with redness. Patient was diagnosed as having left lower extremity cellulitis and she was hospitalized for further management.  Reason for Visit: Left leg cellulitis  Consultants: None  Procedures: None yet  Antibiotics: Vancomycin and Zosyn  Subjective/Interval History: Patient states that the pain is not any better compared to yesterday. She denies any recent travel, however, did go to [place] in July and spent 3 weeks there. Apparently a few weeks after that she developed cellulitis of her thumb which had to be incised and drained. She complains of pain in her left groin area.  ROS: She denies any nausea or vomiting. No chest pain or shortness of breath.  Objective:  Vital Signs  Vitals:   08/10/16 0550 08/10/16 2245 08/11/16 0520 08/11/16 1415  BP: (!) 100/50 (!) 106/58 (!) 100/58 (!) 94/49  Pulse: 69 70 72 79  Resp: 20 18 16 18   Temp: 99.1 F (37.3 C) 98.7 F (37.1 C) 97.8 F (36.6 C) 98.6 F (37 C)  TempSrc: Oral Oral Oral Axillary  SpO2: 97% 93% 94% 95%  Weight:      Height:        Intake/Output Summary (Last 24 hours) at 08/11/16 1419 Last data filed at 08/11/16 1415  Gross per 24 hour  Intake             3075 ml  Output             1600 ml  Net             1475 ml   Filed Weights   08/09/16 2143  Weight: 108.9 kg (240 lb)    General appearance: alert, cooperative, appears stated age and no distress Resp: clear to auscultation bilaterally Cardio: regular rate and rhythm, S1, S2 normal, no murmur, click, rub or gallop GI: soft, non-tender; bowel sounds normal; no masses,  no organomegaly Extremities: Left leg noted to be erythematous. Swollen. Warm to  touch. Tender to palpation. No areas of fluctuation noted. Left inguinal lymphadenopathy also appreciated. Lymph nodes: Inguinal adenopathy: Left Neurologic: Awake and alert. Oriented 3. No focal neurological deficits noted.  Lab Results:  Data Reviewed: I have personally reviewed following labs and imaging studies  CBC:  Recent Labs Lab 08/09/16 2154  WBC 7.8  HGB 12.4  HCT 36.5  MCV 96.6  PLT 123456    Basic Metabolic Panel:  Recent Labs Lab 08/09/16 2154 08/10/16 0924 08/11/16 0440  NA 132* 135 138  K 3.1* 3.0* 3.6  CL 96* 100* 107  CO2 26 27 24   GLUCOSE 156* 177* 99  BUN 25* 21* 17  CREATININE 0.83 0.85 0.69  CALCIUM 8.7* 8.0* 8.2*    GFR: Estimated Creatinine Clearance: 82.9 mL/min (by C-G formula based on SCr of 0.69 mg/dL).  Liver Function Tests:  Recent Labs Lab 08/09/16 2154  AST 44*  ALT 53  ALKPHOS 60  BILITOT 2.7*  PROT 7.4  ALBUMIN 3.6     Recent Labs Lab 08/09/16 2154  LIPASE 22     Recent Results (from the past 240 hour(s))  Urine culture     Status: None   Collection Time: 08/10/16  3:28 AM  Result Value Ref Range Status   Specimen Description URINE, CLEAN CATCH  Final   Special Requests NONE  Final   Culture NO GROWTH Performed at [facility]   Final   Report Status 08/11/2016 FINAL  Final      Radiology Studies: Dg Chest 2 View  Result Date: 08/10/2016 CLINICAL DATA:  Fever and left leg swelling for 24 hours. Shortness of breath. EXAM: CHEST  2 VIEW COMPARISON:  08/06/2012 FINDINGS: Shallow inspiration. Cardiac enlargement. Pulmonary vascularity is normal. Lungs are clear. No focal airspace disease or consolidation. No blunting of costophrenic angles. No pneumothorax. Degenerative changes in the spine. IMPRESSION: Cardiac enlargement.  No evidence of active pulmonary disease. Electronically Signed   By: Lucienne Capers M.D.   On: 08/10/2016 01:22     Medications:  Scheduled: . enoxaparin (LOVENOX) injection   40 mg Subcutaneous Q24H  . gemfibrozil  600 mg Oral BID AC  . pantoprazole  40 mg Oral Daily  . piperacillin-tazobactam (ZOSYN)  IV  3.375 g Intravenous Q8H  . saccharomyces boulardii  250 mg Oral BID  . vancomycin  2,000 mg Intravenous Once  . [START ON 08/12/2016] vancomycin  750 mg Intravenous Q12H   Continuous: . sodium chloride 75 mL/hr at 08/11/16 1105   [id number], alum & mag hydroxide-simeth, ibuprofen, ondansetron **OR** ondansetron (ZOFRAN) IV, oxyCODONE-acetaminophen  Assessment/Plan:  Principal Problem:   Cellulitis of left lower leg Active Problems:   Essential hypertension, benign    Cellulitis of the left lower leg/erysipelas Change to vancomycin and Zosyn. Cultures are pending. Keep leg elevated. Pain medications as needed. Check lower extremity Doppler.  Nausea and vomiting. Most likely due to acute illness. Abdomen is benign. She was placed on PPI which will be continued. Symptomatic treatment for now.  History of essential hypertension. Blood pressure is borderline low. Holding her antihypertensive agents. Give fluid bolus.  Hypokalemia Repleted  DVT Prophylaxis: Lovenox    Code Status: Full code  Family Communication: Discussed with the patient  Disposition Plan: Continue management as outlined above.   LOS: 1 day   [place] [facility] Pager [phone] 08/11/2016, 2:19 PM  If 7PM-7AM, please contact night-coverage at [email or web address], password [facility]

## 2016-08-12 DIAGNOSIS — E876 Hypokalemia: Secondary | ICD-10-CM

## 2016-08-12 DIAGNOSIS — D649 Anemia, unspecified: Secondary | ICD-10-CM

## 2016-08-12 LAB — BASIC METABOLIC PANEL
Anion gap: 7 (ref 5–15)
BUN: 12 mg/dL (ref 6–20)
CHLORIDE: 109 mmol/L (ref 101–111)
CO2: 24 mmol/L (ref 22–32)
Calcium: 7.8 mg/dL — ABNORMAL LOW (ref 8.9–10.3)
Creatinine, Ser: 0.82 mg/dL (ref 0.44–1.00)
Glucose, Bld: 128 mg/dL — ABNORMAL HIGH (ref 65–99)
POTASSIUM: 3.3 mmol/L — AB (ref 3.5–5.1)
SODIUM: 140 mmol/L (ref 135–145)

## 2016-08-12 LAB — CBC
HCT: 29.3 % — ABNORMAL LOW (ref 36.0–46.0)
HEMOGLOBIN: 9.9 g/dL — AB (ref 12.0–15.0)
MCH: 31.9 pg (ref 26.0–34.0)
MCHC: 33.8 g/dL (ref 30.0–36.0)
MCV: 94.5 fL (ref 78.0–100.0)
PLATELETS: 132 10*3/uL — AB (ref 150–400)
RBC: 3.1 MIL/uL — AB (ref 3.87–5.11)
RDW: 14 % (ref 11.5–15.5)
WBC: 3.9 10*3/uL — AB (ref 4.0–10.5)

## 2016-08-12 LAB — MAGNESIUM: MAGNESIUM: 1.9 mg/dL (ref 1.7–2.4)

## 2016-08-12 MED ORDER — POTASSIUM CHLORIDE CRYS ER 20 MEQ PO TBCR
40.0000 meq | EXTENDED_RELEASE_TABLET | Freq: Once | ORAL | Status: AC
  Administered 2016-08-12: 40 meq via ORAL
  Filled 2016-08-12: qty 2

## 2016-08-12 NOTE — Progress Notes (Signed)
[facility] PROGRESS NOTE  Kathleen Wiley [id number] DOB: Jul 16, 1946 DOA: 08/09/2016  PCP: Lilian Coma, MD  Brief History/Interval Summary: 70 year old Caucasian female with a past medical history of GERD, hypertension, presented with fever, chills and complains of pain in the left leg with redness. Patient was diagnosed as having left lower extremity cellulitis and she was hospitalized for further management.  Reason for Visit: Left leg cellulitis  Consultants: None  Procedures: None yet  Antibiotics: Vancomycin and Zosyn  Subjective/Interval History: Patient feels about the same. Pain is about the same in her left leg. She has kept the leg elevated, all night. She denies any nausea this morning.   ROS: She denies any nausea or vomiting. No chest pain or shortness of breath.  Objective:  Vital Signs  Vitals:   08/11/16 0520 08/11/16 1415 08/12/16 0548 08/12/16 0745  BP: (!) 100/58 (!) 94/49 111/68 124/68  Pulse: 72 79 70 67  Resp: 16 18    Temp: 97.8 F (36.6 C) 98.6 F (37 C) 97.6 F (36.4 C) 97.7 F (36.5 C)  TempSrc: Oral Axillary Oral Oral  SpO2: 94% 95% 94% 96%  Weight:      Height:        Intake/Output Summary (Last 24 hours) at 08/12/16 [id number] Last data filed at 08/12/16 0800  Gross per 24 hour  Intake          2939.17 ml  Output             1500 ml  Net          1439.17 ml   Filed Weights   08/09/16 2143  Weight: 108.9 kg (240 lb)    General appearance: alert, cooperative, appears stated age and no distress Resp: clear to auscultation bilaterally Cardio: regular rate and rhythm, S1, S2 normal, no murmur, click, rub or gallop GI: soft, non-tender; bowel sounds normal; no masses,  no organomegaly Extremities: Left leg noted to be erythematous. About the same as yesterday. Perhaps less swollen today compared to yesterday. He continues to be warm to touch. Tender to palpation. No areas of fluctuation noted. Left inguinal lymphadenopathy  also appreciated. Lymph nodes: Inguinal adenopathy: Left Neurologic: Awake and alert. Oriented 3. No focal neurological deficits noted.  Lab Results:  Data Reviewed: I have personally reviewed following labs and imaging studies  CBC:  Recent Labs Lab 08/09/16 2154 08/12/16 0411  WBC 7.8 3.9*  HGB 12.4 9.9*  HCT 36.5 29.3*  MCV 96.6 94.5  PLT 154 132*    Basic Metabolic Panel:  Recent Labs Lab 08/09/16 2154 08/10/16 0924 08/11/16 0440 08/12/16 0411  NA 132* 135 138 140  K 3.1* 3.0* 3.6 3.3*  CL 96* 100* 107 109  CO2 26 27 24 24   GLUCOSE 156* 177* 99 128*  BUN 25* 21* 17 12  CREATININE 0.83 0.85 0.69 0.82  CALCIUM 8.7* 8.0* 8.2* 7.8*  MG  --   --   --  1.9    GFR: Estimated Creatinine Clearance: 80.9 mL/min (by C-G formula based on SCr of 0.82 mg/dL).  Liver Function Tests:  Recent Labs Lab 08/09/16 2154  AST 44*  ALT 53  ALKPHOS 60  BILITOT 2.7*  PROT 7.4  ALBUMIN 3.6     Recent Labs Lab 08/09/16 2154  LIPASE 22     Recent Results (from the past 240 hour(s))  Culture, blood (routine x 2)     Status: None (Preliminary result)   Collection Time: 08/10/16 12:33 AM  Result Value Ref Range Status   Specimen Description BLOOD RIGHT HAND  Final   Special Requests BOTTLES DRAWN AEROBIC AND ANAEROBIC 5CC  Final   Culture   Final    NO GROWTH 1 DAY Performed at [facility]    Report Status PENDING  Incomplete  Culture, blood (routine x 2)     Status: None (Preliminary result)   Collection Time: 08/10/16 12:33 AM  Result Value Ref Range Status   Specimen Description BLOOD LEFT HAND  Final   Special Requests BOTTLES DRAWN AEROBIC AND ANAEROBIC 5ML  Final   Culture   Final    NO GROWTH 1 DAY Performed at [facility]    Report Status PENDING  Incomplete  Urine culture     Status: None   Collection Time: 08/10/16  3:28 AM  Result Value Ref Range Status   Specimen Description URINE, CLEAN CATCH  Final   Special Requests NONE   Final   Culture NO GROWTH Performed at [facility]   Final   Report Status 08/11/2016 FINAL  Final      Radiology Studies: No results found.   Medications:  Scheduled: . enoxaparin (LOVENOX) injection  40 mg Subcutaneous Q24H  . gemfibrozil  600 mg Oral BID AC  . pantoprazole  40 mg Oral Daily  . piperacillin-tazobactam (ZOSYN)  IV  3.375 g Intravenous Q8H  . potassium chloride  40 mEq Oral Once  . saccharomyces boulardii  250 mg Oral BID  . vancomycin  750 mg Intravenous Q12H   Continuous: . sodium chloride 75 mL/hr at 08/11/16 1809   [id number], alum & mag hydroxide-simeth, ibuprofen, lip balm, ondansetron **OR** ondansetron (ZOFRAN) IV, oxyCODONE-acetaminophen  Assessment/Plan:  Principal Problem:   Cellulitis of left lower leg Active Problems:   Essential hypertension, benign    Cellulitis of the left lower leg/erysipelas Patient's left lower extremity appears to be the same as yesterday. Perhaps less erythematous and less swollen today compared to yesterday. No DVT identified. She does having inguinal lymphadenopathy. Continue vancomycin and Zosyn for now. Follow up on cultures. Needed.   Nausea and vomiting. Appears to have resolved. This was most likely due to acute illness. Abdomen is benign. She was placed on PPI which will be continued. Symptomatic treatment for now.  History of essential hypertension. Blood pressure has improved. Continue to hold her antihypertensive medications. Continue to monitor for now.  Normocytic anemia Hemoglobin noted to be much lower today compared to yesterday. There has been no overt bleeding. Drop in hemoglobin is most likely dilutional. Check anemia panel in the morning. Continue to monitor closely.  Hypokalemia Will be repleted today  DVT Prophylaxis: Lovenox    Code Status: Full code  Family Communication: Discussed with the patient  Disposition Plan: Continue management as outlined above. Await  improvement in cellulitis.   LOS: 2 days   [place] [facility] Pager [phone] 08/12/2016, 8:37 AM  If 7PM-7AM, please contact night-coverage at [email or web address], password [facility]

## 2016-08-13 LAB — BASIC METABOLIC PANEL
ANION GAP: 7 (ref 5–15)
BUN: 9 mg/dL (ref 6–20)
CO2: 23 mmol/L (ref 22–32)
Calcium: 8 mg/dL — ABNORMAL LOW (ref 8.9–10.3)
Chloride: 109 mmol/L (ref 101–111)
Creatinine, Ser: 0.64 mg/dL (ref 0.44–1.00)
GFR calc Af Amer: >60 mL/min (ref >60)
GLUCOSE: 108 mg/dL — AB (ref 65–99)
POTASSIUM: 3.5 mmol/L (ref 3.5–5.1)
Sodium: 139 mmol/L (ref 135–145)

## 2016-08-13 LAB — CBC
HEMATOCRIT: 30.8 % — AB (ref 36.0–46.0)
Hemoglobin: 10.2 g/dL — ABNORMAL LOW (ref 12.0–15.0)
MCH: 32.4 pg (ref 26.0–34.0)
MCHC: 33.1 g/dL (ref 30.0–36.0)
MCV: 97.8 fL (ref 78.0–100.0)
PLATELETS: 161 10*3/uL (ref 150–400)
RBC: 3.15 MIL/uL — AB (ref 3.87–5.11)
RDW: 14.3 % (ref 11.5–15.5)
WBC: 4.1 10*3/uL (ref 4.0–10.5)

## 2016-08-13 LAB — IRON AND TIBC
IRON: 45 ug/dL (ref 28–170)
Saturation Ratios: 17 % (ref 10.4–31.8)
TIBC: 272 ug/dL (ref 250–450)
UIBC: 227 ug/dL

## 2016-08-13 LAB — MAGNESIUM: Magnesium: 1.8 mg/dL (ref 1.7–2.4)

## 2016-08-13 LAB — RETICULOCYTES
RBC.: 3.15 MIL/uL — ABNORMAL LOW (ref 3.87–5.11)
RETIC COUNT ABSOLUTE: 88.2 10*3/uL (ref 19.0–186.0)
Retic Ct Pct: 2.8 % (ref 0.4–3.1)

## 2016-08-13 LAB — FOLATE: FOLATE: 17.8 ng/mL (ref >5.9)

## 2016-08-13 LAB — FERRITIN: Ferritin: 542 ng/mL — ABNORMAL HIGH (ref 11–307)

## 2016-08-13 LAB — VITAMIN B12: Vitamin B-12: 1818 pg/mL — ABNORMAL HIGH (ref 180–914)

## 2016-08-13 MED ORDER — NAPHAZOLINE-GLYCERIN 0.012-0.2 % OP SOLN
1.0000 [drp] | Freq: Four times a day (QID) | OPHTHALMIC | Status: DC | PRN
  Filled 2016-08-13: qty 15

## 2016-08-13 MED ORDER — POTASSIUM CHLORIDE CRYS ER 20 MEQ PO TBCR
40.0000 meq | EXTENDED_RELEASE_TABLET | Freq: Once | ORAL | Status: AC
  Administered 2016-08-13: 40 meq via ORAL
  Filled 2016-08-13: qty 2

## 2016-08-13 MED ORDER — TERBINAFINE HCL 1 % EX CREA
TOPICAL_CREAM | Freq: Every day | CUTANEOUS | Status: DC
  Administered 2016-08-13 – 2016-08-14 (×2): via TOPICAL
  Filled 2016-08-13: qty 12

## 2016-08-13 NOTE — Care Management Note (Signed)
Case Management Note  Patient Details  Name: Kathleen Wiley MRN: [id number] Date of Birth: 31-Aug-1946  Subjective/Objective:70 y/o f admitted w/L leg cellulitis. From home.                    Action/Plan:d/c plan home.   Expected Discharge Date:                  Expected Discharge Plan:  Home/Self Care  In-House Referral:     Discharge planning Services  CM Consult  Post Acute Care Choice:    Choice offered to:     DME Arranged:    DME Agency:     HH Arranged:    HH Agency:     Status of Service:  In process, will continue to follow  If discussed at Long Length of Stay Meetings, dates discussed:    Additional Comments:  Dessa Phi, RN 08/13/2016, 12:09 PM

## 2016-08-13 NOTE — Progress Notes (Signed)
[facility] PROGRESS NOTE  Kathleen Wiley [id number] DOB: June 01, 1946 DOA: 08/09/2016  PCP: Lilian Coma, MD  Brief History/Interval Summary: 70 year old Caucasian female with a past medical history of GERD, hypertension, presented with fever, chills and complains of pain in the left leg with redness. Patient was diagnosed as having left lower extremity cellulitis and she was hospitalized for further management.  Reason for Visit: Left leg cellulitis  Consultants: None  Procedures: None yet  Antibiotics: Vancomycin and Zosyn  Subjective/Interval History: Patient frustrated by the slow progress. She states that her left leg does not feel any better. Continues to have some pain. Reports poor appetite but no nausea or vomiting.   ROS: No chest pain or shortness of breath.  Objective:  Vital Signs  Vitals:   08/12/16 0745 08/12/16 1345 08/12/16 2314 08/13/16 0601  BP: 124/68 (!) 117/54 (!) 121/58 118/60  Pulse: 67 70 79 76  Resp:   16 18  Temp: 97.7 F (36.5 C) 98.3 F (36.8 C) 98.2 F (36.8 C) 98.9 F (37.2 C)  TempSrc: Oral Oral Oral Oral  SpO2: 96% 97% 93% 95%  Weight:      Height:        Intake/Output Summary (Last 24 hours) at 08/13/16 [id number] Last data filed at 08/13/16 0900  Gross per 24 hour  Intake             2930 ml  Output             1475 ml  Net             1455 ml   Filed Weights   08/09/16 2143  Weight: 108.9 kg (240 lb)    General appearance: alert, cooperative, appears stated age and no distress Resp: clear to auscultation bilaterally Cardio: regular rate and rhythm, S1, S2 normal, no murmur, click, rub or gallop GI: soft, non-tender; bowel sounds normal; no masses,  no organomegaly Extremities: Left leg continues to be erythematous and swollen, although the degree of erythema and swelling appears to be less today compared to yesterday. Less tender to palpation. No areas of fluctuation noted. Left inguinal lymphadenopathy also  appreciated. Lymph nodes: Inguinal adenopathy: Left Neurologic: Awake and alert. Oriented 3. No focal neurological deficits noted.  Lab Results:  Data Reviewed: I have personally reviewed following labs and imaging studies  CBC:  Recent Labs Lab 08/09/16 2154 08/12/16 0411 08/13/16 0519  WBC 7.8 3.9* 4.1  HGB 12.4 9.9* 10.2*  HCT 36.5 29.3* 30.8*  MCV 96.6 94.5 97.8  PLT 154 132* [id number]    Basic Metabolic Panel:  Recent Labs Lab 08/09/16 2154 08/10/16 0924 08/11/16 0440 08/12/16 0411 08/13/16 0519  NA 132* 135 138 140 139  K 3.1* 3.0* 3.6 3.3* 3.5  CL 96* 100* 107 109 109  CO2 26 27 24 24 23   GLUCOSE 156* 177* 99 128* 108*  BUN 25* 21* 17 12 9   CREATININE 0.83 0.85 0.69 0.82 0.64  CALCIUM 8.7* 8.0* 8.2* 7.8* 8.0*  MG  --   --   --  1.9 1.8    GFR: Estimated Creatinine Clearance: 82.9 mL/min (by C-G formula based on SCr of 0.64 mg/dL).  Liver Function Tests:  Recent Labs Lab 08/09/16 2154  AST 44*  ALT 53  ALKPHOS 60  BILITOT 2.7*  PROT 7.4  ALBUMIN 3.6     Recent Labs Lab 08/09/16 2154  LIPASE 22     Recent Results (from the past 240 hour(s))  Culture, blood (  routine x 2)     Status: None (Preliminary result)   Collection Time: 08/10/16 12:33 AM  Result Value Ref Range Status   Specimen Description BLOOD RIGHT HAND  Final   Special Requests BOTTLES DRAWN AEROBIC AND ANAEROBIC 5CC  Final   Culture   Final    NO GROWTH 2 DAYS Performed at [facility]    Report Status PENDING  Incomplete  Culture, blood (routine x 2)     Status: None (Preliminary result)   Collection Time: 08/10/16 12:33 AM  Result Value Ref Range Status   Specimen Description BLOOD LEFT HAND  Final   Special Requests BOTTLES DRAWN AEROBIC AND ANAEROBIC 5ML  Final   Culture   Final    NO GROWTH 2 DAYS Performed at [facility]    Report Status PENDING  Incomplete  Urine culture     Status: None   Collection Time: 08/10/16  3:28 AM  Result Value Ref  Range Status   Specimen Description URINE, CLEAN CATCH  Final   Special Requests NONE  Final   Culture NO GROWTH Performed at [facility]   Final   Report Status 08/11/2016 FINAL  Final      Radiology Studies: No results found.   Medications:  Scheduled: . enoxaparin (LOVENOX) injection  40 mg Subcutaneous Q24H  . gemfibrozil  600 mg Oral BID AC  . pantoprazole  40 mg Oral Daily  . piperacillin-tazobactam (ZOSYN)  IV  3.375 g Intravenous Q8H  . potassium chloride  40 mEq Oral Once  . saccharomyces boulardii  250 mg Oral BID  . terbinafine   Topical Daily  . vancomycin  750 mg Intravenous Q12H   Continuous: . sodium chloride 75 mL/hr at 08/13/16 0015   [id number], alum & mag hydroxide-simeth, ibuprofen, lip balm, ondansetron **OR** ondansetron (ZOFRAN) IV, oxyCODONE-acetaminophen  Assessment/Plan:  Principal Problem:   Cellulitis of left lower leg Active Problems:   Essential hypertension, benign    Cellulitis of the left lower leg/erysipelas Patient is exhibiting some signs of improvement in her left leg. She however does not feel that it is improving. Patient was reassured. Continue to keep the left leg elevated. Continue current antibiotics for now. No DVT identified based on Doppler studies. She does have some inguinal lymphadenopathy. Cultures are negative so far. Lactic acid level was normal. There was no clear evidence for sepsis at admission.  Nausea and vomiting. Appears to have resolved. This was most likely due to acute illness. Abdomen is benign. She was placed on PPI which will be continued. Symptomatic treatment for now.  History of essential hypertension. Patient did have low blood pressures initially. These have improved. Continue to hold her antihypertensive medications. Continue to monitor for now.  Normocytic anemia Hemoglobin noted to be much lower today compared to yesterday. There has been no overt bleeding. Drop in hemoglobin is  most likely dilutional. Check anemia panel in the morning. Continue to monitor closely.  Hypokalemia Improved. We'll give additional dose. Magnesium is 1.8.  DVT Prophylaxis: Lovenox    Code Status: Full code  Family Communication: Discussed with the patient  Disposition Plan: Continue management as outlined above. Await improvement in cellulitis.   LOS: 3 days   [place] [facility] Pager [phone] 08/13/2016, 9:28 AM  If 7PM-7AM, please contact night-coverage at [email or web address], password [facility]

## 2016-08-14 LAB — VANCOMYCIN, TROUGH: Vancomycin Tr: 10 ug/mL — ABNORMAL LOW (ref 15–20)

## 2016-08-14 MED ORDER — POLYETHYLENE GLYCOL 3350 17 G PO PACK
17.0000 g | PACK | Freq: Every day | ORAL | Status: DC
  Administered 2016-08-14 – 2016-08-15 (×2): 17 g via ORAL
  Filled 2016-08-14 (×5): qty 1

## 2016-08-14 MED ORDER — SENNA 8.6 MG PO TABS
1.0000 | ORAL_TABLET | Freq: Two times a day (BID) | ORAL | Status: DC
  Administered 2016-08-14 – 2016-08-20 (×11): 8.6 mg via ORAL
  Filled 2016-08-14 (×13): qty 1

## 2016-08-14 MED ORDER — HYDROCORTISONE 2.5 % RE CREA
TOPICAL_CREAM | Freq: Two times a day (BID) | RECTAL | Status: DC
  Administered 2016-08-14: 11:00:00 via TOPICAL
  Administered 2016-08-14: 1 via TOPICAL
  Administered 2016-08-15 – 2016-08-20 (×8): via TOPICAL
  Filled 2016-08-14: qty 28.35

## 2016-08-14 NOTE — Progress Notes (Signed)
[facility] PROGRESS NOTE  Kathleen Wiley [id number] DOB: [birth year] DOA: 08/09/2016  PCP: Lilian Coma, MD  Brief History/Interval Summary: 70 year old Caucasian female with a past medical history of GERD, hypertension, presented with fever, chills and complains of pain in the left leg with redness. Patient was diagnosed as having left lower extremity cellulitis and she was hospitalized for further management.  Reason for Visit: Left leg cellulitis  Consultants: None  Procedures: None yet  Antibiotics: Vancomycin and Zosyn  Subjective/Interval History: Patient states that the leg does not feel as tight as it did the last few days. Pain level has also improved. However, the leg remains red.   ROS: No chest pain or shortness of breath.  Objective:  Vital Signs  Vitals:   08/13/16 0601 08/13/16 1429 08/13/16 2354 08/14/16 0609  BP: 118/60 95/71 129/78 126/74  Pulse: 76 74 74 70  Resp: 18 16 16 16   Temp: 98.9 F (37.2 C) 98 F (36.7 C) 98.7 F (37.1 C) 98.6 F (37 C)  TempSrc: Oral Oral Oral Oral  SpO2: 95% 100% 96% 96%  Weight:      Height:        Intake/Output Summary (Last 24 hours) at 08/14/16 0901 Last data filed at 08/14/16 0610  Gross per 24 hour  Intake             1290 ml  Output             1800 ml  Net             -510 ml   Filed Weights   08/09/16 2143  Weight: 108.9 kg (240 lb)    General appearance: alert, cooperative, appears stated age and no distress Resp: clear to auscultation bilaterally Cardio: regular rate and rhythm, S1, S2 normal, no murmur, click, rub or gallop GI: soft, non-tender; bowel sounds normal; no masses,  no organomegaly Extremities: Patient continues to have erythema and warmth over her left lower extremity. Less tender to palpation. No areas of fluctuation. Left inguinal lymphadenopathy is appreciated.  Lymph nodes: Inguinal adenopathy: Left Neurologic: Awake and alert. Oriented 3. No focal neurological  deficits noted.  Lab Results:  Data Reviewed: I have personally reviewed following labs and imaging studies  CBC:  Recent Labs Lab 08/09/16 2154 08/12/16 0411 08/13/16 0519  WBC 7.8 3.9* 4.1  HGB 12.4 9.9* 10.2*  HCT 36.5 29.3* 30.8*  MCV 96.6 94.5 97.8  PLT 154 132* [id number]    Basic Metabolic Panel:  Recent Labs Lab 08/09/16 2154 08/10/16 0924 08/11/16 0440 08/12/16 0411 08/13/16 0519  NA 132* 135 138 140 139  K 3.1* 3.0* 3.6 3.3* 3.5  CL 96* 100* 107 109 109  CO2 26 27 24 24 23   GLUCOSE 156* 177* 99 128* 108*  BUN 25* 21* 17 12 9   CREATININE 0.83 0.85 0.69 0.82 0.64  CALCIUM 8.7* 8.0* 8.2* 7.8* 8.0*  MG  --   --   --  1.9 1.8    GFR: Estimated Creatinine Clearance: 82.9 mL/min (by C-G formula based on SCr of 0.64 mg/dL).  Liver Function Tests:  Recent Labs Lab 08/09/16 2154  AST 44*  ALT 53  ALKPHOS 60  BILITOT 2.7*  PROT 7.4  ALBUMIN 3.6     Recent Labs Lab 08/09/16 2154  LIPASE 22     Recent Results (from the past 240 hour(s))  Culture, blood (routine x 2)     Status: None (Preliminary result)   Collection Time: 08/10/16  12:33 AM  Result Value Ref Range Status   Specimen Description BLOOD RIGHT HAND  Final   Special Requests BOTTLES DRAWN AEROBIC AND ANAEROBIC 5CC  Final   Culture   Final    NO GROWTH 3 DAYS Performed at [facility]    Report Status PENDING  Incomplete  Culture, blood (routine x 2)     Status: None (Preliminary result)   Collection Time: 08/10/16 12:33 AM  Result Value Ref Range Status   Specimen Description BLOOD LEFT HAND  Final   Special Requests BOTTLES DRAWN AEROBIC AND ANAEROBIC 5ML  Final   Culture   Final    NO GROWTH 3 DAYS Performed at [facility]    Report Status PENDING  Incomplete  Urine culture     Status: None   Collection Time: 08/10/16  3:28 AM  Result Value Ref Range Status   Specimen Description URINE, CLEAN CATCH  Final   Special Requests NONE  Final   Culture NO  GROWTH Performed at [facility]   Final   Report Status 08/11/2016 FINAL  Final      Radiology Studies: No results found.   Medications:  Scheduled: . enoxaparin (LOVENOX) injection  40 mg Subcutaneous Q24H  . gemfibrozil  600 mg Oral BID AC  . hydrocortisone   Topical BID  . pantoprazole  40 mg Oral Daily  . piperacillin-tazobactam (ZOSYN)  IV  3.375 g Intravenous Q8H  . polyethylene glycol  17 g Oral Daily  . saccharomyces boulardii  250 mg Oral BID  . senna  1 tablet Oral BID  . terbinafine   Topical Daily  . vancomycin  750 mg Intravenous Q12H   Continuous: . sodium chloride 10 mL/hr at 08/13/16 1020   [id number], alum & mag hydroxide-simeth, ibuprofen, lip balm, naphazoline-glycerin, ondansetron **OR** ondansetron (ZOFRAN) IV, oxyCODONE-acetaminophen  Assessment/Plan:  Principal Problem:   Cellulitis of left lower leg Active Problems:   Essential hypertension, benign    Cellulitis of the left lower leg/erysipelas Patient has been very slow to improve, although this appears to be some signs of improvement in her left leg. Continue to keep the left leg elevated. Continue current antibiotics for now. No DVT identified based on Doppler studies. She does have some inguinal lymphadenopathy. Cultures are negative so far. Lactic acid level was normal. There was no clear evidence for sepsis at admission.  Nausea and vomiting. Appears to have resolved. This was most likely due to acute illness. Abdomen is benign. She was placed on PPI which will be continued. Symptomatic treatment for now.  History of essential hypertension. Patient did have low blood pressures initially. These have improved. Continue to hold her antihypertensive medications. Continue to monitor for now.  Normocytic anemia Hemoglobin did drop some due to dilution, but has been stable since. Anemia panel reviewed. No overt bleeding has been noted.   Hypokalemia Improved. Magnesium is  1.8.  Constipation. Initiate laxatives.  DVT Prophylaxis: Lovenox    Code Status: Full code  Family Communication: Discussed with the patient  Disposition Plan: Continue management as outlined above. Await improvement in cellulitis. Okay to start mobilizing.   LOS: 4 days   [facility] Pager [phone] 08/14/2016, 9:01 AM  If 7PM-7AM, please contact night-coverage at [email or web address], password [email or web address]

## 2016-08-14 NOTE — Progress Notes (Signed)
Pharmacy Antibiotic Note  Kathleen Wiley is a 70 y.o. female admitted on 08/09/2016 with cellulitis. Patient was given one-time doses of vancomycin and zosyn on 10/3, then narrowed to ceftriaxone. On 10/4, coverage was broadened back to vancomycin and zosyn per Pharmacy protocol.  Plan:  vanc trough today=10, therapeutic (MD notes pt improving)  Continue vancoymcin 750mg  IV q12h; goal trough 10-15 mcg/ml  Continue Zosyn 3.375gm IV q8h (4hr extended infusions)  Follow up renal function & cultures, de-escalate as appropriate  Height: 5\' 6"  (167.6 cm) Weight: 240 lb (108.9 kg) IBW/kg (Calculated) : 59.3  Temp (24hrs), Avg:98.4 F (36.9 C), Min:98 F (36.7 C), Max:98.7 F (37.1 C)   Recent Labs Lab 08/09/16 2154 08/10/16 0042 08/10/16 0924 08/11/16 0440 08/12/16 0411 08/13/16 0519 08/14/16 1137  WBC 7.8  --   --   --  3.9* 4.1  --   CREATININE 0.83  --  0.85 0.69 0.82 0.64  --   LATICACIDVEN  --  1.13  --   --   --   --   --   VANCOTROUGH  --   --   --   --   --   --  10*    Estimated Creatinine Clearance: 82.9 mL/min (by C-G formula based on SCr of 0.64 mg/dL).    Allergies  Allergen Reactions  . Other     Refuses whole blood but does accept albumin  . Metronidazole Other (See Comments)    Chest pain, A-fib   . Septra [Sulfamethoxazole-Trimethoprim] Other (See Comments)    Blisters     Antimicrobials this admission:  10/3 Rocephin >> 10/4 10/3 Vanc x 1, resume 10/4 >> 10/3 Zosyn x 1, resume 10/4 >>  Dose adjustments this admission:  ---  Microbiology results:  10/3 BCx: ngtd 10/3 UCx: NGF   Thank you for allowing pharmacy to be a part of this patient's care.  Dolly Rias RPh 08/14/2016, 1:31 PM Pager [phone]

## 2016-08-15 ENCOUNTER — Inpatient Hospital Stay (HOSPITAL_COMMUNITY): Payer: Medicare Other

## 2016-08-15 ENCOUNTER — Encounter (HOSPITAL_COMMUNITY): Payer: Self-pay | Admitting: Radiology

## 2016-08-15 LAB — CBC
HCT: 30.1 % — ABNORMAL LOW (ref 36.0–46.0)
Hemoglobin: 10.1 g/dL — ABNORMAL LOW (ref 12.0–15.0)
MCH: 33 pg (ref 26.0–34.0)
MCHC: 33.6 g/dL (ref 30.0–36.0)
MCV: 98.4 fL (ref 78.0–100.0)
PLATELETS: 191 10*3/uL (ref 150–400)
RBC: 3.06 MIL/uL — AB (ref 3.87–5.11)
RDW: 14.4 % (ref 11.5–15.5)
WBC: 3.9 10*3/uL — ABNORMAL LOW (ref 4.0–10.5)

## 2016-08-15 LAB — BASIC METABOLIC PANEL
Anion gap: 7 (ref 5–15)
BUN: 10 mg/dL (ref 6–20)
CALCIUM: 8.1 mg/dL — AB (ref 8.9–10.3)
CO2: 23 mmol/L (ref 22–32)
CREATININE: 0.63 mg/dL (ref 0.44–1.00)
Chloride: 109 mmol/L (ref 101–111)
GFR calc Af Amer: >60 mL/min (ref >60)
Glucose, Bld: 116 mg/dL — ABNORMAL HIGH (ref 65–99)
Potassium: 3.7 mmol/L (ref 3.5–5.1)
SODIUM: 139 mmol/L (ref 135–145)

## 2016-08-15 LAB — CULTURE, BLOOD (ROUTINE X 2)
Culture: NO GROWTH
Culture: NO GROWTH

## 2016-08-15 IMAGING — CT CT TIBIA FIBULA *L* W/ CM
3 of 5 series · 16 of 33 positions shown, 19 images · IV contrast (ISOVUE)
Comparison: None.

CONTRAST:  100mL [AN] IOPAMIDOL ([AN]) INJECTION 61%

CLINICAL DATA: Pain, swelling and redness from knee to ankle for 1
week.

EXAM:
CT OF THE LOWER LEFT EXTREMITY WITH CONTRAST
TECHNIQUE: Multidetector CT imaging of the lower left extremity was performed
according to the standard protocol following intravenous contrast
administration.

[Series 4: lt tib fib st · axial · 0.49mm/px · z∈[-564,-216]mm · 10 of 138 slices shown, 13 images]
[im 11/138  soft-tissue]
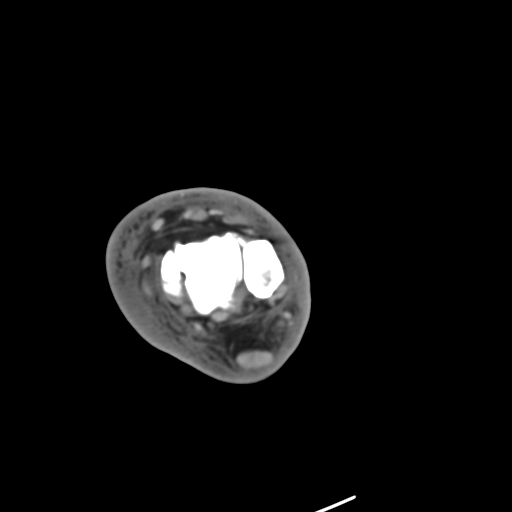
[im 11/138  bone]
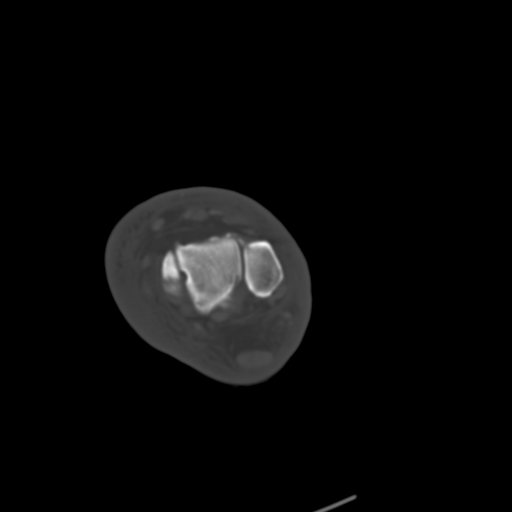
[im 22/138  bone]
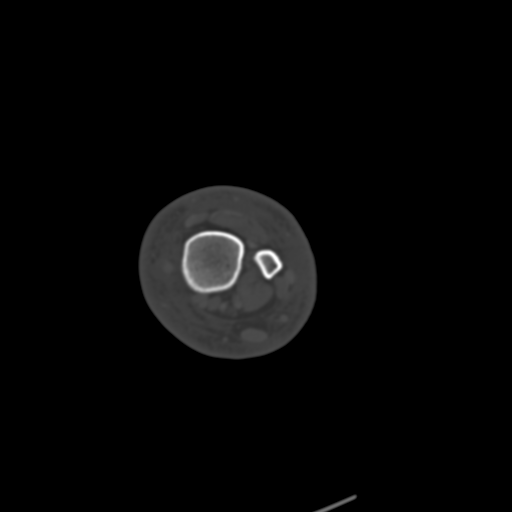
[im 43/138  bone]
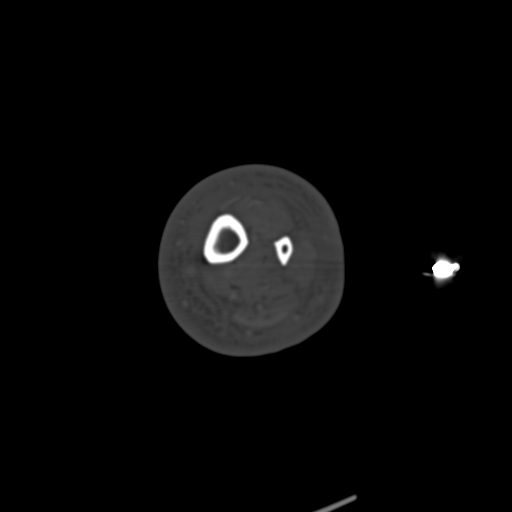
[im 53/138  bone]
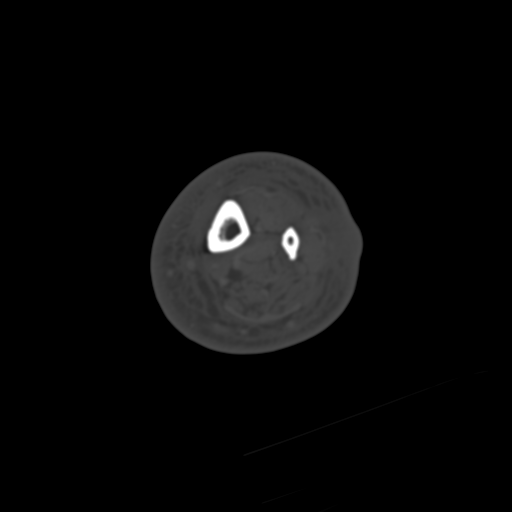
[im 64/138  soft-tissue]
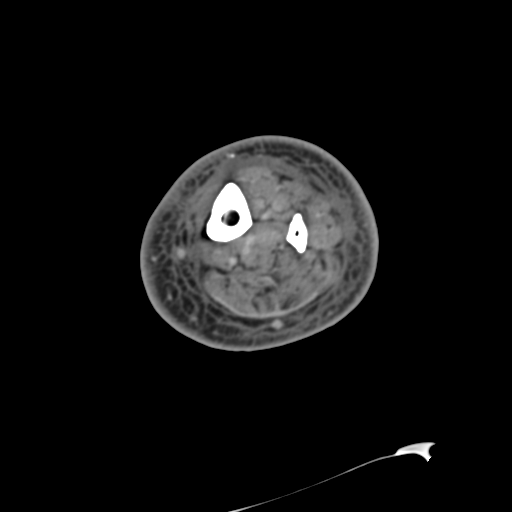
[im 64/138  bone]
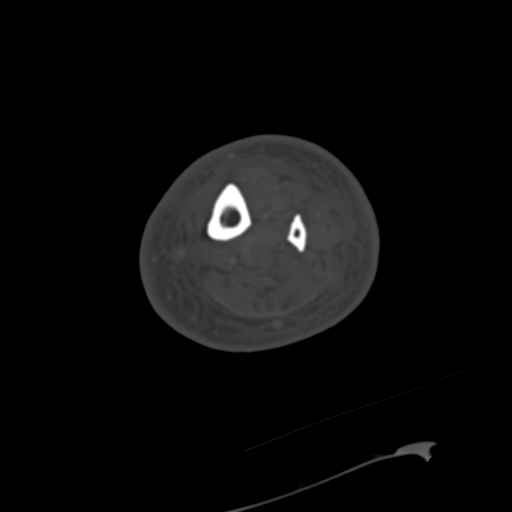
[im 74/138  bone]
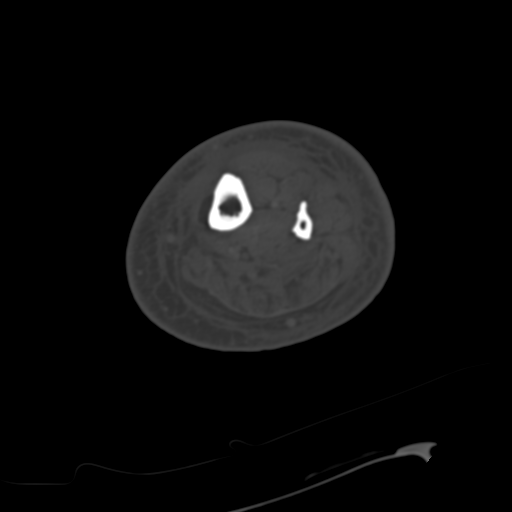
[im 85/138  bone]
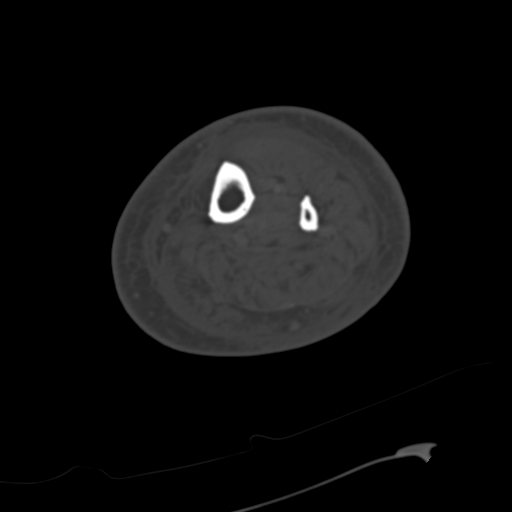
[im 106/138  bone]
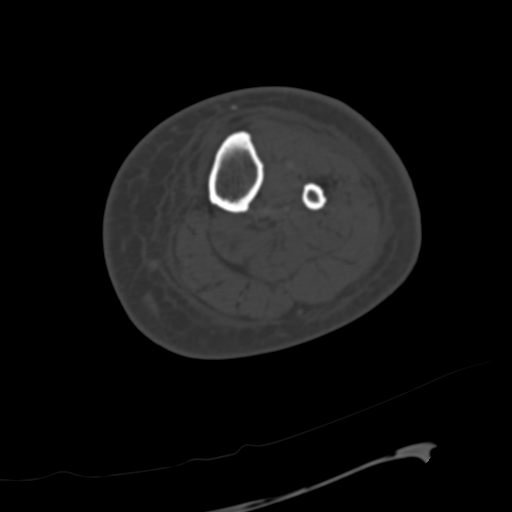
[im 116/138  soft-tissue]
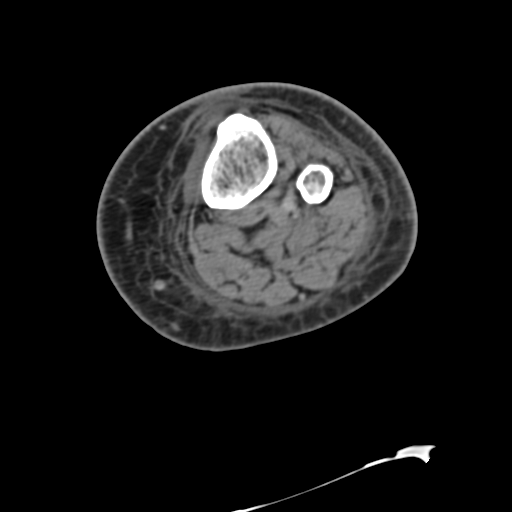
[im 116/138  bone]
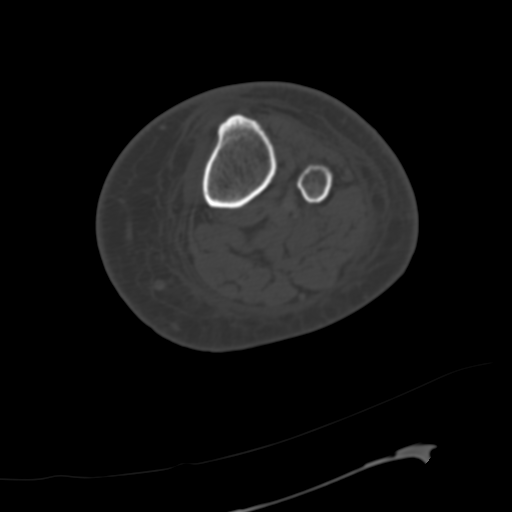
[im 127/138  bone]
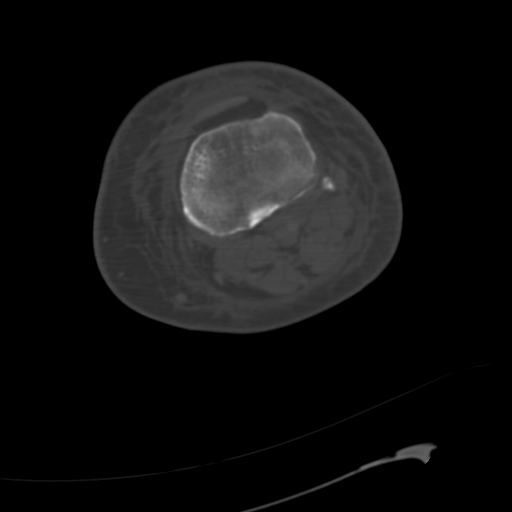

[Series 602: <mpr thick range> · coronal · 0.81mm/px · 1 of 81 slices shown]
[im 41/81  bone]
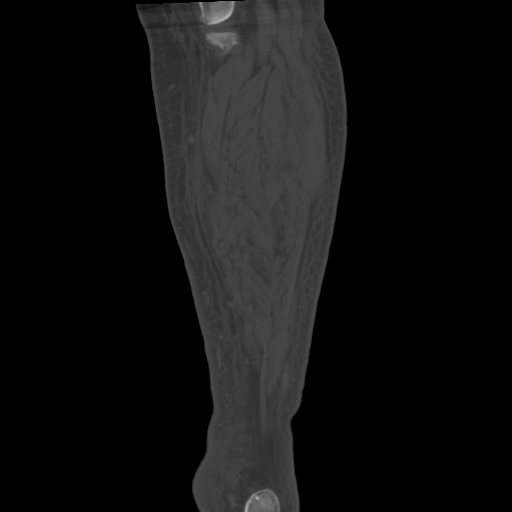

[Series 607: <mpr thick range(4)> · sagittal · 0.81mm/px · 5 of 83 slices shown]
[im 14/83  bone]
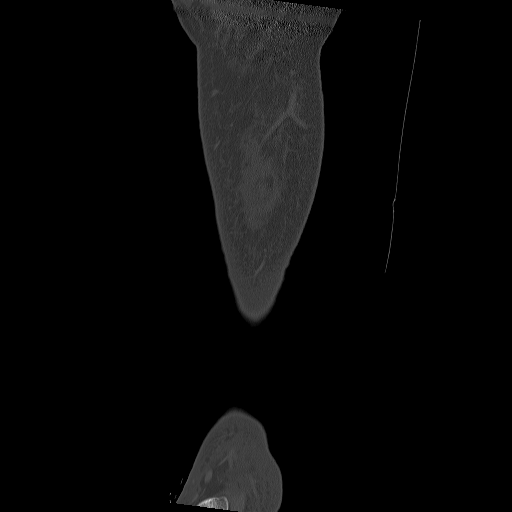
[im 28/83  bone]
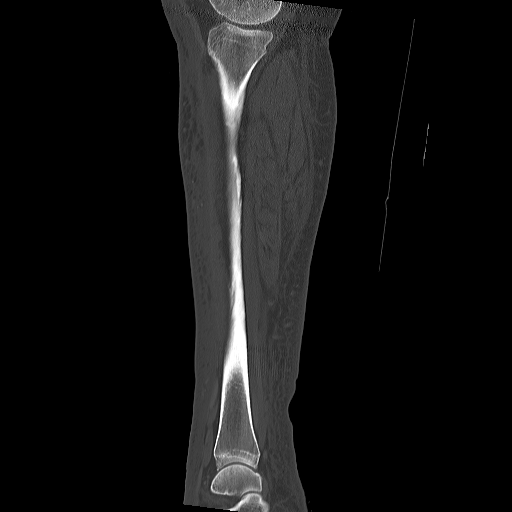
[im 42/83  bone]
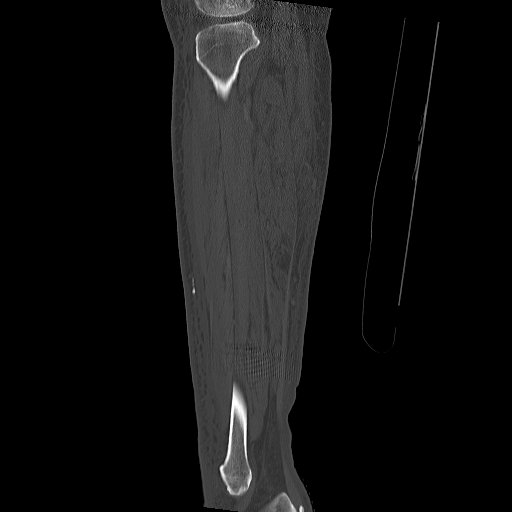
[im 55/83  bone]
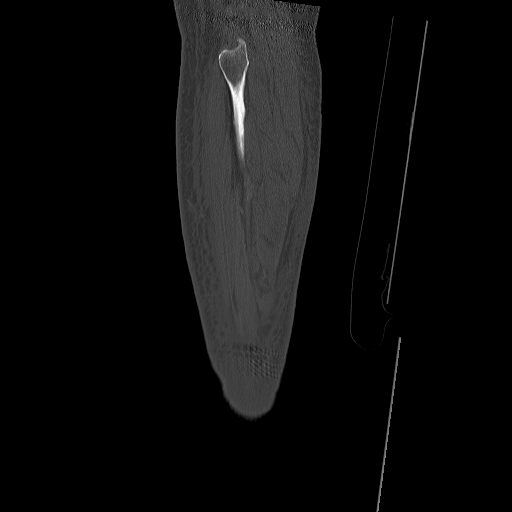
[im 69/83  bone]
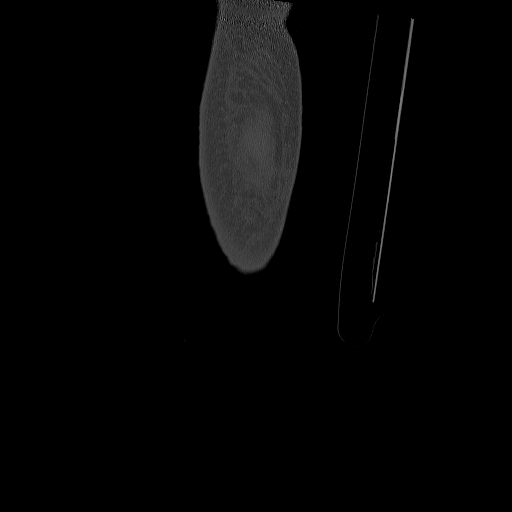

[16 of 33 positions shown; findings below may reference images not displayed]

FINDINGS: Diffuse subcutaneous edema/inflammation/fluid involving the entire
right lower extremity below the knee. Findings consistent with
cellulitis. I do not see any definite findings for myofasciitis or
pyomyositis. No definite rim enhancing fluid collection to suggest a
drainable abscess. Focal area of bulging of the scan laterally at
the junction of the middle and distal thirds of the fibula. This
could be a skin blister.

No definite CT findings to suggest septic arthritis or
osteomyelitis.
IMPRESSION: Severe diffuse cellulitis without definite CT findings for
myofasciitis or pyomyositis. No obvious discrete drainable rim
enhancing abscess.

No CT findings for septic arthritis or osteomyelitis.

If symptoms persist or worsen MR is recommended as a much better
imaging examination for this type of process.

## 2016-08-15 MED ORDER — IOPAMIDOL (ISOVUE-300) INJECTION 61%: 100.0000 mL | Freq: Once | INTRAVENOUS | Status: DC | PRN

## 2016-08-15 MED ORDER — TERBINAFINE HCL 1 % EX CREA
TOPICAL_CREAM | Freq: Two times a day (BID) | CUTANEOUS | Status: DC
  Administered 2016-08-15: 1 via TOPICAL
  Administered 2016-08-15 – 2016-08-16 (×2): via TOPICAL
  Administered 2016-08-16: 1 via TOPICAL
  Administered 2016-08-17: 21:00:00 via TOPICAL
  Administered 2016-08-17: 1 via TOPICAL
  Administered 2016-08-18 – 2016-08-20 (×5): via TOPICAL
  Filled 2016-08-15: qty 12

## 2016-08-15 MED ORDER — IOPAMIDOL (ISOVUE-300) INJECTION 61%
100.0000 mL | Freq: Once | INTRAVENOUS | Status: AC | PRN
  Administered 2016-08-15: 100 mL via INTRAVENOUS

## 2016-08-15 NOTE — Evaluation (Signed)
Physical Therapy Evaluation Patient Details Name: Kathleen Wiley MRN: [id number] DOB: July 29, 1946 Today's Date: 08/15/2016   History of Present Illness  70 year old Caucasian female with a past medical history of GERD, hypertension, presented with fever, chills and complains of pain in the left leg with redness. Patient was diagnosed as having left lower extremity cellulitis and she was hospitalized for further management; CT results pending    Clinical Impression  Pt admitted with above diagnosis. Pt currently with functional limitations due to the deficits listed below (see PT Problem List). * Pt will benefit from skilled PT to increase their independence and safety with mobility to allow discharge to the venue listed below.   Pt did very well today, feel she can amb with nursing staff as well; will continue to follow, pt has steps to get into her apt and then her bed/bath are  on second floor and will need to practice stairs prior to return home; likely will not need f/u PT at D/C    Follow Up Recommendations No PT follow up    Equipment Recommendations  None recommended by PT    Recommendations for Other Services       Precautions / Restrictions Restrictions Weight Bearing Restrictions: No      Mobility  Bed Mobility Overal bed mobility: Needs Assistance Bed Mobility: Supine to Sit     Supine to sit: Supervision     General bed mobility comments: for safety  Transfers Overall transfer level: Needs assistance Equipment used: Rolling walker (2 wheeled) Transfers: Sit to/from Stand Sit to Stand: Supervision;Min guard         General transfer comment: cues for safety and hand placement  Ambulation/Gait Ambulation/Gait assistance: Min guard;Supervision Ambulation Distance (Feet): 120 Feet Assistive device: Rolling walker (2 wheeled) Gait Pattern/deviations: Step-to pattern;Antalgic;Decreased weight shift to left     General Gait Details: cues for RW position  from self  Stairs            Wheelchair Mobility    Modified Rankin (Stroke Patients Only)       Balance                                             Pertinent Vitals/Pain Pain Assessment: 0-10 Pain Score: 2  Pain Location: LLE Pain Descriptors / Indicators: Sore Pain Intervention(s): Limited activity within patient's tolerance;Monitored during session;Premedicated before session;Repositioned    Home Living Family/patient expects to be discharged to:: Private residence Living Arrangements: Spouse/significant other   Type of Home: Apartment Home Access: Stairs to enter   Technical brewer of Steps: 3 Home Layout: Two level;1/2 bath on main level;Bed/bath upstairs Home Equipment: Walker - 2 wheels;Shower seat;Bedside commode;Walker - standard;Cane - single point Additional Comments: pt reports she is alone during the day    Prior Function Level of Independence: Independent with assistive device(s);Independent         Comments: cane sometimes     Hand Dominance        Extremity/Trunk Assessment   Upper Extremity Assessment: Overall WFL for tasks assessed           Lower Extremity Assessment: LLE deficits/detail   LLE Deficits / Details: slight limitations AROM L ankle d/t edema; otherwise grossly WFL     Communication   Communication: No difficulties  Cognition Arousal/Alertness: Awake/alert Behavior During Therapy: WFL for tasks assessed/performed Overall Cognitive Status: Within  Functional Limits for tasks assessed                      General Comments      Exercises     Assessment/Plan    PT Assessment Patient needs continued PT services  PT Problem List Decreased activity tolerance;Pain;Obesity          PT Treatment Interventions DME instruction;Gait training;Stair training;Therapeutic exercise;Patient/family education    PT Goals (Current goals can be found in the Care Plan section)  Acute Rehab  PT Goals Patient Stated Goal: home soon PT Goal Formulation: With patient Time For Goal Achievement: 08/22/16 Potential to Achieve Goals: Good    Frequency Min 3X/week   Barriers to discharge        Co-evaluation               End of Session   Activity Tolerance: Patient tolerated treatment well Patient left: in chair;with call bell/phone within reach           Time: 1134-1152 PT Time Calculation (min) (ACUTE ONLY): 18 min   Charges:   PT Evaluation $PT Eval Low Complexity: 1 Procedure     PT G Codes:        Arabelle Bollig 09/12/2016, 1:06 PM

## 2016-08-15 NOTE — Progress Notes (Signed)
[facility] PROGRESS NOTE  NOEMI DELAY [id number] DOB: [birth year] DOA: 08/09/2016  PCP: Lilian Coma, MD  Brief History/Interval Summary: 70 year old Caucasian female with a past medical history of GERD, hypertension, presented with fever, chills and complains of pain in the left leg with redness. Patient was diagnosed as having left lower extremity cellulitis and she was hospitalized for further management. She has been very slow to improve. A yellowish blister noted in the left leg. CT scan to be done today.  Reason for Visit: Left leg cellulitis  Consultants: None  Procedures: None yet  Antibiotics: Vancomycin and Zosyn  Subjective/Interval History: Patient had severe pain overnight which was treated with medications. Pain is better this morning. Denies any fever or chills. States that some areas of her left leg to look less red. Still concerned about persistence of her symptoms.   ROS: No chest pain or shortness of breath.  Objective:  Vital Signs  Vitals:   08/13/16 2354 08/14/16 0609 08/14/16 1500 08/15/16 0442  BP: 129/78 126/74 125/75 132/64  Pulse: 74 70 71 74  Resp: 16 16 15 16   Temp: 98.7 F (37.1 C) 98.6 F (37 C) 98.1 F (36.7 C) 98.7 F (37.1 C)  TempSrc: Oral Oral Oral Oral  SpO2: 96% 96% 97% 95%  Weight:      Height:        Intake/Output Summary (Last 24 hours) at 08/15/16 0841 Last data filed at 08/15/16 0800  Gross per 24 hour  Intake              510 ml  Output             1950 ml  Net            -1440 ml   Filed Weights   08/09/16 2143  Weight: 108.9 kg (240 lb)    General appearance: alert, cooperative, appears stated age and no distress Resp: clear to auscultation bilaterally Cardio: regular rate and rhythm, S1, S2 normal, no murmur, click, rub or gallop GI: soft, non-tender; bowel sounds normal; no masses,  no organomegaly Extremities: Patient's left leg continues to have erythema and warmth over her left lower  extremity. There is some areas which are not quite as erythematous as before. There is a blister formation noted in the lateral aspect of the leg. No other areas of fluctuation noted. Less tender to palpation. Left inguinal lymphadenopathy is appreciated. Swelling over the left foot has improved as well. Lymph nodes: Inguinal adenopathy: Left Neurologic: Awake and alert. Oriented 3. No focal neurological deficits noted.  Lab Results:  Data Reviewed: I have personally reviewed following labs and imaging studies  CBC:  Recent Labs Lab 08/09/16 2154 08/12/16 0411 08/13/16 0519 08/15/16 0445  WBC 7.8 3.9* 4.1 3.9*  HGB 12.4 9.9* 10.2* 10.1*  HCT 36.5 29.3* 30.8* 30.1*  MCV 96.6 94.5 97.8 98.4  PLT 154 132* 161 [id number]    Basic Metabolic Panel:  Recent Labs Lab 08/10/16 0924 08/11/16 0440 08/12/16 0411 08/13/16 0519 08/15/16 0445  NA 135 138 140 139 139  K 3.0* 3.6 3.3* 3.5 3.7  CL 100* 107 109 109 109  CO2 27 24 24 23 23   GLUCOSE 177* 99 128* 108* 116*  BUN 21* 17 12 9 10   CREATININE 0.85 0.69 0.82 0.64 0.63  CALCIUM 8.0* 8.2* 7.8* 8.0* 8.1*  MG  --   --  1.9 1.8  --     GFR: Estimated Creatinine Clearance: 82.9 mL/min (by C-G  formula based on SCr of 0.63 mg/dL).  Liver Function Tests:  Recent Labs Lab 08/09/16 2154  AST 44*  ALT 53  ALKPHOS 60  BILITOT 2.7*  PROT 7.4  ALBUMIN 3.6     Recent Labs Lab 08/09/16 2154  LIPASE 22     Recent Results (from the past 240 hour(s))  Culture, blood (routine x 2)     Status: None (Preliminary result)   Collection Time: 08/10/16 12:33 AM  Result Value Ref Range Status   Specimen Description BLOOD RIGHT HAND  Final   Special Requests BOTTLES DRAWN AEROBIC AND ANAEROBIC 5CC  Final   Culture   Final    NO GROWTH 4 DAYS Performed at [facility]    Report Status PENDING  Incomplete  Culture, blood (routine x 2)     Status: None (Preliminary result)   Collection Time: 08/10/16 12:33 AM  Result Value Ref  Range Status   Specimen Description BLOOD LEFT HAND  Final   Special Requests BOTTLES DRAWN AEROBIC AND ANAEROBIC 5ML  Final   Culture   Final    NO GROWTH 4 DAYS Performed at [facility]    Report Status PENDING  Incomplete  Urine culture     Status: None   Collection Time: 08/10/16  3:28 AM  Result Value Ref Range Status   Specimen Description URINE, CLEAN CATCH  Final   Special Requests NONE  Final   Culture NO GROWTH Performed at [facility]   Final   Report Status 08/11/2016 FINAL  Final      Radiology Studies: No results found.   Medications:  Scheduled: . enoxaparin (LOVENOX) injection  40 mg Subcutaneous Q24H  . gemfibrozil  600 mg Oral BID AC  . hydrocortisone   Topical BID  . pantoprazole  40 mg Oral Daily  . piperacillin-tazobactam (ZOSYN)  IV  3.375 g Intravenous Q8H  . polyethylene glycol  17 g Oral Daily  . saccharomyces boulardii  250 mg Oral BID  . senna  1 tablet Oral BID  . terbinafine   Topical BID  . vancomycin  750 mg Intravenous Q12H   Continuous: . sodium chloride 10 mL/hr at 08/13/16 1020   [id number], alum & mag hydroxide-simeth, ibuprofen, lip balm, naphazoline-glycerin, ondansetron **OR** ondansetron (ZOFRAN) IV, oxyCODONE-acetaminophen  Assessment/Plan:  Principal Problem:   Cellulitis of left lower leg Active Problems:   Essential hypertension, benign    Cellulitis of the left lower leg/erysipelas Patient's cellulitis has been very slow to improve. There is a blister formation noted in the lateral aspect of her leg. I think there is some improvement today compared to yesterday. However, patient remains concerned. It may be reasonable to image that leg. We will proceed with CT scan of her lower extremity. For now, continue with vancomycin and Zosyn. Keep the leg elevated. Pain control. Cultures have all been negative so far. No DVT noted on ultrasound.  Nausea and vomiting. Appears to have resolved. This was  most likely due to acute illness. Abdomen is benign. She was placed on PPI which will be continued. Symptomatic treatment for now.  History of essential hypertension. Patient did have low blood pressures initially. These have improved. Continue to hold her antihypertensive medications. Continue to monitor for now.  Normocytic anemia Hemoglobin did drop some due to dilution, but has been stable since. Anemia panel reviewed. No overt bleeding has been noted.   Hypokalemia Improved. Magnesium is 1.8.  Constipation. Initiated laxatives.  DVT Prophylaxis: Lovenox  Code Status: Full code  Family Communication: Discussed with the patient  Disposition Plan: Continue management as outlined above. Await CT of her left leg.   LOS: 5 days   [facility] Pager [phone] 08/15/2016, 8:41 AM  If 7PM-7AM, please contact night-coverage at [email or web address], password [facility]

## 2016-08-16 DIAGNOSIS — E785 Hyperlipidemia, unspecified: Secondary | ICD-10-CM

## 2016-08-16 DIAGNOSIS — D72819 Decreased white blood cell count, unspecified: Secondary | ICD-10-CM

## 2016-08-16 NOTE — Progress Notes (Signed)
Patient ID: Kathleen Wiley, female   DOB: 08-Feb-1946, 70 y.o.   MRN: [id number]  PROGRESS NOTE    Kathleen Wiley  [id number] DOB: 08/06/1946 DOA: 08/09/2016  PCP: Lilian Coma, MD   Brief Narrative:  70 year old female with a past medical history of GERD, hypertension who presented to [facility] with fever, chills and complains of pain in the left leg with redness. CT of the left lower extremity with contrast showed severe diffuse cellulitis without a definite findings for mild fasciitis or pyomyositis, no evidence of drainable abscess. Patient was started on vancomycin.   Assessment & Plan:   Principal Problem:   Cellulitis of left lower leg / leukopenia - Slowly improving - Continue vancomycin - Blood cultures negative so far  Active Problems:   Essential hypertension, benign - At home pt takes maxzide but currently BP controlled without BP meds    Dyslipidemia - Continue gemfibrozil 600 mg twice daily   DVT prophylaxis: Lovenox subcutaneous Code Status: full code  Family Communication: No family at the bedside Disposition Plan: Home once cellulitis improves   Consultants:   None   Procedures:   None   Antimicrobials:    Vancomycin   Subjective: Feels okay this am.  Objective: Vitals:   08/15/16 0442 08/15/16 1414 08/15/16 2006 08/16/16 0628  BP: 132/64 125/62 119/65 139/79  Pulse: 74 67 70 73  Resp: 16 16 16 16   Temp: 98.7 F (37.1 C) 98.5 F (36.9 C) 98.3 F (36.8 C) 98.8 F (37.1 C)  TempSrc: Oral Oral Oral Oral  SpO2: 95% 98% 98% 95%  Weight:      Height:        Intake/Output Summary (Last 24 hours) at 08/16/16 1058 Last data filed at 08/16/16 0754  Gross per 24 hour  Intake              660 ml  Output             1300 ml  Net             -640 ml   Filed Weights   08/09/16 2143  Weight: 108.9 kg (240 lb)    Examination:  General exam: Appears calm and comfortable  Respiratory system: Clear to auscultation. Respiratory effort  normal. Cardiovascular system: S1 & S2 heard, RRR.  Gastrointestinal system: Abdomen is nondistended, soft and nontender. No organomegaly or masses felt. Normal bowel sounds heard. Central nervous system: Alert and oriented. No focal neurological deficits. Extremities: LLE swollen, red up to the knee from ankle  Skin: warm, dry Psychiatry: Judgement and insight appear normal. Mood & affect appropriate.   Data Reviewed: I have personally reviewed following labs and imaging studies  CBC:  Recent Labs Lab 08/09/16 2154 08/12/16 0411 08/13/16 0519 08/15/16 0445  WBC 7.8 3.9* 4.1 3.9*  HGB 12.4 9.9* 10.2* 10.1*  HCT 36.5 29.3* 30.8* 30.1*  MCV 96.6 94.5 97.8 98.4  PLT 154 132* 161 [id number]   Basic Metabolic Panel:  Recent Labs Lab 08/10/16 0924 08/11/16 0440 08/12/16 0411 08/13/16 0519 08/15/16 0445  NA 135 138 140 139 139  K 3.0* 3.6 3.3* 3.5 3.7  CL 100* 107 109 109 109  CO2 27 24 24 23 23   GLUCOSE 177* 99 128* 108* 116*  BUN 21* 17 12 9 10   CREATININE 0.85 0.69 0.82 0.64 0.63  CALCIUM 8.0* 8.2* 7.8* 8.0* 8.1*  MG  --   --  1.9 1.8  --    GFR: Estimated Creatinine Clearance:  82.9 mL/min (by C-G formula based on SCr of 0.63 mg/dL). Liver Function Tests:  Recent Labs Lab 08/09/16 2154  AST 44*  ALT 53  ALKPHOS 60  BILITOT 2.7*  PROT 7.4  ALBUMIN 3.6    Recent Labs Lab 08/09/16 2154  LIPASE 22   No results for input(s): AMMONIA in the last 168 hours. Coagulation Profile: No results for input(s): INR, PROTIME in the last 168 hours. Cardiac Enzymes: No results for input(s): CKTOTAL, CKMB, CKMBINDEX, TROPONINI in the last 168 hours. BNP (last 3 results) No results for input(s): PROBNP in the last 8760 hours. HbA1C: No results for input(s): HGBA1C in the last 72 hours. CBG: No results for input(s): GLUCAP in the last 168 hours. Lipid Profile: No results for input(s): CHOL, HDL, LDLCALC, TRIG, CHOLHDL, LDLDIRECT in the last 72 hours. Thyroid Function  Tests: No results for input(s): TSH, T4TOTAL, FREET4, T3FREE, THYROIDAB in the last 72 hours. Anemia Panel: No results for input(s): VITAMINB12, FOLATE, FERRITIN, TIBC, IRON, RETICCTPCT in the last 72 hours. Urine analysis:    Component Value Date/Time   COLORURINE ORANGE (A) 08/10/2016 0328   APPEARANCEUR CLOUDY (A) 08/10/2016 0328   LABSPEC 1.022 08/10/2016 0328   PHURINE 6.0 08/10/2016 0328   GLUCOSEU NEGATIVE 08/10/2016 0328   HGBUR NEGATIVE 08/10/2016 0328   BILIRUBINUR SMALL (A) 08/10/2016 0328   KETONESUR NEGATIVE 08/10/2016 0328   PROTEINUR 30 (A) 08/10/2016 0328   UROBILINOGEN 1.0 08/06/2012 2046   NITRITE NEGATIVE 08/10/2016 0328   LEUKOCYTESUR TRACE (A) 08/10/2016 0328   Sepsis Labs: @LABRCNTIP (procalcitonin:4,lacticidven:4)   Recent Results (from the past 240 hour(s))  Culture, blood (routine x 2)     Status: None   Collection Time: 08/10/16 12:33 AM  Result Value Ref Range Status   Specimen Description BLOOD RIGHT HAND  Final   Special Requests BOTTLES DRAWN AEROBIC AND ANAEROBIC 5CC  Final   Culture   Final    NO GROWTH 5 DAYS Performed at [facility]    Report Status 08/15/2016 FINAL  Final  Culture, blood (routine x 2)     Status: None   Collection Time: 08/10/16 12:33 AM  Result Value Ref Range Status   Specimen Description BLOOD LEFT HAND  Final   Special Requests BOTTLES DRAWN AEROBIC AND ANAEROBIC 5ML  Final   Culture   Final    NO GROWTH 5 DAYS Performed at [facility]    Report Status 08/15/2016 FINAL  Final  Urine culture     Status: None   Collection Time: 08/10/16  3:28 AM  Result Value Ref Range Status   Specimen Description URINE, CLEAN CATCH  Final   Special Requests NONE  Final   Culture NO GROWTH Performed at [facility]   Final   Report Status 08/11/2016 FINAL  Final      Radiology Studies: Ct Tibia Fibula Left W Contrast Result Date: 08/15/2016 Severe diffuse cellulitis without definite CT findings for  myofasciitis or pyomyositis. No obvious discrete drainable rim enhancing abscess. No CT findings for septic arthritis or osteomyelitis. If symptoms persist or worsen MR is recommended as a much better imaging examination for this type of process.     Scheduled Meds: . enoxaparin (LOVENOX) injection  40 mg Subcutaneous Q24H  . gemfibrozil  600 mg Oral BID AC  . hydrocortisone   Topical BID  . pantoprazole  40 mg Oral Daily  . piperacillin-tazobactam (ZOSYN)  IV  3.375 g Intravenous Q8H  . polyethylene glycol  17  g Oral Daily  . saccharomyces boulardii  250 mg Oral BID  . senna  1 tablet Oral BID  . terbinafine   Topical BID  . vancomycin  750 mg Intravenous Q12H   Continuous Infusions: . sodium chloride 10 mL/hr at 08/13/16 1020     LOS: 6 days    Time spent: 25 minutes  Greater than 50% of the time spent on counseling and coordinating the care.   Leisa Lenz, MD [facility] Pager [phone]  If 7PM-7AM, please contact night-coverage [email or web address] Password [email or web address] 08/16/2016, 10:58 AM

## 2016-08-17 LAB — CBC
HCT: 32 % — ABNORMAL LOW (ref 36.0–46.0)
Hemoglobin: 10.3 g/dL — ABNORMAL LOW (ref 12.0–15.0)
MCH: 32.3 pg (ref 26.0–34.0)
MCHC: 32.2 g/dL (ref 30.0–36.0)
MCV: 100.3 fL — AB (ref 78.0–100.0)
PLATELETS: 178 10*3/uL (ref 150–400)
RBC: 3.19 MIL/uL — ABNORMAL LOW (ref 3.87–5.11)
RDW: 14.4 % (ref 11.5–15.5)
WBC: 3.9 10*3/uL — ABNORMAL LOW (ref 4.0–10.5)

## 2016-08-17 LAB — BASIC METABOLIC PANEL
Anion gap: 9 (ref 5–15)
BUN: 9 mg/dL (ref 6–20)
CALCIUM: 8.1 mg/dL — AB (ref 8.9–10.3)
CO2: 21 mmol/L — ABNORMAL LOW (ref 22–32)
Chloride: 109 mmol/L (ref 101–111)
Creatinine, Ser: 0.61 mg/dL (ref 0.44–1.00)
GFR calc Af Amer: >60 mL/min (ref >60)
GLUCOSE: 113 mg/dL — AB (ref 65–99)
Potassium: 3.8 mmol/L (ref 3.5–5.1)
Sodium: 139 mmol/L (ref 135–145)

## 2016-08-17 NOTE — Progress Notes (Addendum)
Patient ID: Kathleen Wiley, female   DOB: 10-12-1946, 70 y.o.   MRN: [id number]  PROGRESS NOTE    Kathleen Wiley  [id number] DOB: [birth year] DOA: 08/09/2016  PCP: Lilian Coma, MD   Brief Narrative:  70 year old female with a past medical history of GERD, hypertension who presented to [facility] with fever, chills and complains of pain in the left leg with redness. CT of the left lower extremity with contrast showed severe diffuse cellulitis without a definite findings for mild fasciitis or pyomyositis, no evidence of drainable abscess. Patient was started on vancomycin.   Assessment & Plan:   Principal Problem:   Cellulitis of left lower leg / leukopenia - Very slow improvement - No DVT on lower extremity doppler  - Continue vancomycin, stop zosyn  - Blood cultures negative so far  Active Problems:   Essential hypertension, benign - At home pt takes maxzide but currently BP controlled without BP meds    Dyslipidemia - Continue gemfibrozil 600 mg twice daily   DVT prophylaxis: Lovenox subcutaneous Code Status: full code  Family Communication: No family at the bedside Disposition Plan: Home once cellulitis improves   Consultants:   None   Procedures:   Bilateral LE doppler 10/4 - no DVT   Antimicrobials:    Vancomycin 08/09/2016 -->   Zosyn 08/09/2016 --> 08/17/2016    Subjective: Feels okay this am.  Objective: Vitals:   08/16/16 0628 08/16/16 1328 08/16/16 2237 08/17/16 0535  BP: 139/79 (!) 128/57 (!) 126/55 111/67  Pulse: 73 68 70 64  Resp: 16 18 16 16   Temp: 98.8 F (37.1 C) 98.7 F (37.1 C) 98 F (36.7 C) 97.9 F (36.6 C)  TempSrc: Oral Oral Oral Oral  SpO2: 95% 97% 94% 95%  Weight:      Height:        Intake/Output Summary (Last 24 hours) at 08/17/16 1026 Last data filed at 08/17/16 0942  Gross per 24 hour  Intake             2070 ml  Output             2200 ml  Net             -130 ml   Filed Weights   08/09/16 2143  Weight: 108.9 kg  (240 lb)    Examination:  General exam: No distress  Respiratory system: No wheezing, no rhonchi  Cardiovascular system: S1 & S2 heard, Rate controlled .  Gastrointestinal system: (+) BS, non tender  Central nervous system: No focal neurological deficits. Extremities: LLE swollen, red up to the knee from ankle but seems better since yesterday   Skin: warm, dry Psychiatry: Normal mood and behavior.   Data Reviewed: I have personally reviewed following labs and imaging studies  CBC:  Recent Labs Lab 08/12/16 0411 08/13/16 0519 08/15/16 0445 08/17/16 0439  WBC 3.9* 4.1 3.9* 3.9*  HGB 9.9* 10.2* 10.1* 10.3*  HCT 29.3* 30.8* 30.1* 32.0*  MCV 94.5 97.8 98.4 100.3*  PLT 132* 161 191 0000000   Basic Metabolic Panel:  Recent Labs Lab 08/11/16 0440 08/12/16 0411 08/13/16 0519 08/15/16 0445 08/17/16 0439  NA 138 140 139 139 139  K 3.6 3.3* 3.5 3.7 3.8  CL 107 109 109 109 109  CO2 24 24 23 23  21*  GLUCOSE 99 128* 108* 116* 113*  BUN 17 12 9 10 9   CREATININE 0.69 0.82 0.64 0.63 0.61  CALCIUM 8.2* 7.8* 8.0* 8.1* 8.1*  MG  --  1.9 1.8  --   --    GFR: Estimated Creatinine Clearance: 82.9 mL/min (by C-G formula based on SCr of 0.61 mg/dL). Liver Function Tests: No results for input(s): AST, ALT, ALKPHOS, BILITOT, PROT, ALBUMIN in the last 168 hours. No results for input(s): LIPASE, AMYLASE in the last 168 hours. No results for input(s): AMMONIA in the last 168 hours. Coagulation Profile: No results for input(s): INR, PROTIME in the last 168 hours. Cardiac Enzymes: No results for input(s): CKTOTAL, CKMB, CKMBINDEX, TROPONINI in the last 168 hours. BNP (last 3 results) No results for input(s): PROBNP in the last 8760 hours. HbA1C: No results for input(s): HGBA1C in the last 72 hours. CBG: No results for input(s): GLUCAP in the last 168 hours. Lipid Profile: No results for input(s): CHOL, HDL, LDLCALC, TRIG, CHOLHDL, LDLDIRECT in the last 72 hours. Thyroid Function  Tests: No results for input(s): TSH, T4TOTAL, FREET4, T3FREE, THYROIDAB in the last 72 hours. Anemia Panel: No results for input(s): VITAMINB12, FOLATE, FERRITIN, TIBC, IRON, RETICCTPCT in the last 72 hours. Urine analysis:    Component Value Date/Time   COLORURINE ORANGE (A) 08/10/2016 0328   APPEARANCEUR CLOUDY (A) 08/10/2016 0328   LABSPEC 1.022 08/10/2016 0328   PHURINE 6.0 08/10/2016 0328   GLUCOSEU NEGATIVE 08/10/2016 0328   HGBUR NEGATIVE 08/10/2016 0328   BILIRUBINUR SMALL (A) 08/10/2016 0328   KETONESUR NEGATIVE 08/10/2016 0328   PROTEINUR 30 (A) 08/10/2016 0328   UROBILINOGEN 1.0 08/06/2012 2046   NITRITE NEGATIVE 08/10/2016 0328   LEUKOCYTESUR TRACE (A) 08/10/2016 0328   Sepsis Labs: @LABRCNTIP (procalcitonin:4,lacticidven:4)   Recent Results (from the past 240 hour(s))  Culture, blood (routine x 2)     Status: None   Collection Time: 08/10/16 12:33 AM  Result Value Ref Range Status   Specimen Description BLOOD RIGHT HAND  Final   Special Requests BOTTLES DRAWN AEROBIC AND ANAEROBIC 5CC  Final   Culture   Final    NO GROWTH 5 DAYS Performed at [facility]    Report Status 08/15/2016 FINAL  Final  Culture, blood (routine x 2)     Status: None   Collection Time: 08/10/16 12:33 AM  Result Value Ref Range Status   Specimen Description BLOOD LEFT HAND  Final   Special Requests BOTTLES DRAWN AEROBIC AND ANAEROBIC 5ML  Final   Culture   Final    NO GROWTH 5 DAYS Performed at [facility]    Report Status 08/15/2016 FINAL  Final  Urine culture     Status: None   Collection Time: 08/10/16  3:28 AM  Result Value Ref Range Status   Specimen Description URINE, CLEAN CATCH  Final   Special Requests NONE  Final   Culture NO GROWTH Performed at [facility]   Final   Report Status 08/11/2016 FINAL  Final      Radiology Studies: Ct Tibia Fibula Left W Contrast Result Date: 08/15/2016 Severe diffuse cellulitis without definite CT findings for  myofasciitis or pyomyositis. No obvious discrete drainable rim enhancing abscess. No CT findings for septic arthritis or osteomyelitis. If symptoms persist or worsen MR is recommended as a much better imaging examination for this type of process.     Scheduled Meds: . enoxaparin (LOVENOX) injection  40 mg Subcutaneous Q24H  . gemfibrozil  600 mg Oral BID AC  . hydrocortisone   Topical BID  . pantoprazole  40 mg Oral Daily  . piperacillin-tazobactam (ZOSYN)  IV  3.375 g Intravenous Q8H  . polyethylene  glycol  17 g Oral Daily  . saccharomyces boulardii  250 mg Oral BID  . senna  1 tablet Oral BID  . terbinafine   Topical BID  . vancomycin  750 mg Intravenous Q12H   Continuous Infusions: . sodium chloride 10 mL/hr at 08/13/16 1020     LOS: 7 days    Time spent: 15 minutes  Greater than 50% of the time spent on counseling and coordinating the care.   Leisa Lenz, MD [facility] Pager [phone]  If 7PM-7AM, please contact night-coverage [email or web address] Password [email or web address] 08/17/2016, 10:26 AM

## 2016-08-17 NOTE — Progress Notes (Signed)
   08/17/16 1200  PT Visit Information  Last PT Received On 08/17/16  Assistance Needed +1  History of Present Illness 70 year old Caucasian female with a past medical history of GERD, hypertension, presented with fever, chills and complains of pain in the left leg with redness. Patient was diagnosed as having left lower extremity cellulitis and she was hospitalized for further management  Subjective Data  Patient Stated Goal home soon  Restrictions  Weight Bearing Restrictions No  Pain Assessment  Pain Assessment 0-10  Pain Score 3  Pain Location LLE  Pain Descriptors / Indicators Grimacing;Tightness  Pain Intervention(s) Limited activity within patient's tolerance;Monitored during session;Premedicated before session;Other (comment) (LLE elevated)  Cognition  Arousal/Alertness Awake/alert  Behavior During Therapy WFL for tasks assessed/performed  Overall Cognitive Status Within Functional Limits for tasks assessed  Bed Mobility  General bed mobility comments NT--in chair  Transfers  Overall transfer level Modified independent  Ambulation/Gait  Ambulation/Gait assistance Supervision  Ambulation Distance (Feet) 350 Feet  Assistive device Rolling walker (2 wheeled)  Gait Pattern/deviations Step-through pattern;Decreased weight shift to left  General Gait Details cues for RW position from self, improved wt shift to LLE although still reliant on UEs  General Exercises - Lower Extremity  Ankle Circles/Pumps AROM;Both;10 reps  PT - End of Session  Activity Tolerance Patient tolerated treatment well  Patient left in chair;with call bell/phone within reach;with nursing/sitter in room  PT - Assessment/Plan  PT Plan Frequency needs to be updated;Current plan remains appropriate  PT Frequency (ACUTE ONLY) Min 2X/week  Follow Up Recommendations No PT follow up  PT equipment None recommended by PT  PT Goal Progression  Progress towards PT goals Progressing toward goals  Acute Rehab PT  Goals  PT Goal Formulation With patient  Time For Goal Achievement 08/22/16  Potential to Achieve Goals Good  PT Time Calculation  PT Start Time (ACUTE ONLY) 1051  PT Stop Time (ACUTE ONLY) 1115  PT Time Calculation (min) (ACUTE ONLY) 24 min  PT General Charges  $$ ACUTE PT VISIT 1 Procedure  PT Treatments  $Gait Training 23-37 mins

## 2016-08-17 NOTE — Progress Notes (Signed)
Pharmacy Antibiotic Note  Kathleen Wiley is a 70 y.o. female admitted on 08/09/2016 with cellulitis. Patient was given one-time doses of vancomycin and zosyn on 10/3, then narrowed to ceftriaxone. On 10/4, coverage was broadened back to vancomycin and zosyn per Pharmacy protocol.  She is currently on day#7 of IV antibiotics. Cellulitis is slow to resolve per MD note.   She is hemodynamically stable & renal function has been stable.    Plan:  Continue vancoymcin 750mg  IV q12h; goal trough 10-15 mcg/ml  Continue Zosyn 3.375gm IV q8h (4hr extended infusions)  Follow up renal function & cultures   Weekly Vancomycin trough  Consider de-escalate antibiotics as appropriate  Height: 5\' 6"  (167.6 cm) Weight: 240 lb (108.9 kg) IBW/kg (Calculated) : 59.3  Temp (24hrs), Avg:98.2 F (36.8 C), Min:97.9 F (36.6 C), Max:98.7 F (37.1 C)   Recent Labs Lab 08/11/16 0440 08/12/16 0411 08/13/16 0519 08/14/16 1137 08/15/16 0445 08/17/16 0439  WBC  --  3.9* 4.1  --  3.9* 3.9*  CREATININE 0.69 0.82 0.64  --  0.63 0.61  VANCOTROUGH  --   --   --  10*  --   --     Estimated Creatinine Clearance: 82.9 mL/min (by C-G formula based on SCr of 0.61 mg/dL).    Allergies  Allergen Reactions  . Other     Refuses whole blood but does accept albumin  . Metronidazole Other (See Comments)    Chest pain, A-fib   . Septra [Sulfamethoxazole-Trimethoprim] Other (See Comments)    Blisters     Antimicrobials this admission:  10/3 Rocephin >> 10/4 10/3 Vanc x 1, resume 10/4 >> 10/3 Zosyn x 1, resume 10/4 >>  Dose adjustments this admission:  ---  Microbiology results:  10/3 BCx: ngtd 10/3 UCx: NGF   Thank you for allowing pharmacy to be a part of this patient's care.  Netta Cedars, PharmD, BCPS Pager: [phone] 08/17/2016, 12:44 PM

## 2016-08-17 NOTE — Care Management Important Message (Signed)
Important Message  Patient Details  Name: Kathleen Wiley MRN: [id number] Date of Birth: [birth year]   Medicare Important Message Given:  Yes    Camillo Flaming 08/17/2016, 10:27 AMImportant Message  Patient Details  Name: Kathleen Wiley MRN: [id number] Date of Birth: [birth year]   Medicare Important Message Given:  Yes    Camillo Flaming 08/17/2016, 10:27 AM

## 2016-08-18 DIAGNOSIS — L03116 Cellulitis of left lower limb: Principal | ICD-10-CM

## 2016-08-18 LAB — CBC
HEMATOCRIT: 32.7 % — AB (ref 36.0–46.0)
HEMOGLOBIN: 10.6 g/dL — AB (ref 12.0–15.0)
MCH: 32.1 pg (ref 26.0–34.0)
MCHC: 32.4 g/dL (ref 30.0–36.0)
MCV: 99.1 fL (ref 78.0–100.0)
Platelets: 216 10*3/uL (ref 150–400)
RBC: 3.3 MIL/uL — AB (ref 3.87–5.11)
RDW: 14.5 % (ref 11.5–15.5)
WBC: 3.7 10*3/uL — ABNORMAL LOW (ref 4.0–10.5)

## 2016-08-18 MED ORDER — CEPHALEXIN 500 MG PO CAPS
500.0000 mg | ORAL_CAPSULE | Freq: Three times a day (TID) | ORAL | Status: DC
  Administered 2016-08-18 – 2016-08-19 (×5): 500 mg via ORAL
  Filled 2016-08-18 (×7): qty 1

## 2016-08-18 NOTE — Progress Notes (Signed)
Paged Dr. Wendee Beavers. IV at bedside expressing concerns about IV access with pt currently having ordered Vancomycin. Dr Wendee Beavers called back and stated that he did not want to place a PICC at this time. I expressed concerns about PICC line being needed due to medication being caustic to the vein. Dr. Wendee Beavers stated that he wanted IV team to attempt access first. IV team notified of conversation. IV acces established.   Sol Passer, RN

## 2016-08-18 NOTE — Progress Notes (Signed)
Dear Doctor Wendee Beavers: This patient has been identified as a candidate for PICC for the following reason (s): IV therapy over 48 hours, drug pH or osmolality (causing phlebitis, infiltration in 24 hours) and restarts due to phlebitis and infiltration in 24 hours If you agree, please write an order for the indicated device. For any questions contact the Vascular Access Team at [phone] if no answer, please leave a message.  Thank you for supporting the early vascular access assessment program.

## 2016-08-18 NOTE — Progress Notes (Signed)
Patient ID: Kathleen Wiley, female   DOB: October 02, 1946, 70 y.o.   MRN: [id number]  PROGRESS NOTE    Kathleen Wiley  [id number] DOB: August 19, 1946 DOA: 08/09/2016  PCP: Lilian Coma, MD   Brief Narrative:  70 year old female with a past medical history of GERD, hypertension who presented to [facility] with fever, chills and complains of pain in the left leg with redness. CT of the left lower extremity with contrast showed severe diffuse cellulitis without a definite findings for mild fasciitis or pyomyositis, no evidence of drainable abscess. Patient was started on vancomycin.   Assessment & Plan:   Principal Problem:   Cellulitis of left lower leg / leukopenia - Very slow improvement - No DVT on lower extremity doppler  - Continue vancomycin, Will add cephalosporin - Blood cultures negative so far  Active Problems:   Essential hypertension, benign - At home pt takes maxzide but currently BP controlled without BP meds    Dyslipidemia - Continue gemfibrozil 600 mg twice daily   DVT prophylaxis: Lovenox subcutaneous Code Status: full code  Family Communication: No family at the bedside Disposition Plan: Home once cellulitis improves   Consultants:   None   Procedures:   Bilateral LE doppler 10/4 - no DVT   Antimicrobials:    Vancomycin 08/09/2016 -->   Zosyn 08/09/2016 --> 08/17/2016   Cephalosporin 10/11   Subjective: Reports minimal improvement  Objective: Vitals:   08/17/16 1403 08/17/16 2207 08/18/16 0609 08/18/16 1328  BP: 105/60 (!) 144/66 119/71 (!) 123/59  Pulse: 68 67 67 69  Resp: 18 16 16 18   Temp: 98.5 F (36.9 C) 98.4 F (36.9 C) 98.3 F (36.8 C) 98.5 F (36.9 C)  TempSrc: Oral Oral Oral Oral  SpO2: 97% 97% 98% 98%  Weight:      Height:        Intake/Output Summary (Last 24 hours) at 08/18/16 1341 Last data filed at 08/18/16 1318  Gross per 24 hour  Intake             1740 ml  Output             2000 ml  Net             -260 ml   Filed  Weights   08/09/16 2143  Weight: 108.9 kg (240 lb)    Examination:  General exam: Pt in NAD, alert and awake Respiratory system: No wheezing, no rhonchi , equal chest rise. Cardiovascular system: S1 & S2 heard, Rate controlled .  Gastrointestinal system: (+) BS, non tender  Central nervous system: No focal neurological deficits. Answers questions appropriately Extremities: LLE swollen, red up to the knee from ankle but seems better since yesterday   Skin: warm, dry Psychiatry: Normal mood and behavior.   Data Reviewed: I have personally reviewed following labs and imaging studies  CBC:  Recent Labs Lab 08/12/16 0411 08/13/16 0519 08/15/16 0445 08/17/16 0439 08/18/16 0442  WBC 3.9* 4.1 3.9* 3.9* 3.7*  HGB 9.9* 10.2* 10.1* 10.3* 10.6*  HCT 29.3* 30.8* 30.1* 32.0* 32.7*  MCV 94.5 97.8 98.4 100.3* 99.1  PLT 132* 161 191 178 123XX123   Basic Metabolic Panel:  Recent Labs Lab 08/12/16 0411 08/13/16 0519 08/15/16 0445 08/17/16 0439  NA 140 139 139 139  K 3.3* 3.5 3.7 3.8  CL 109 109 109 109  CO2 24 23 23  21*  GLUCOSE 128* 108* 116* 113*  BUN 12 9 10 9   CREATININE 0.82 0.64 0.63 0.61  CALCIUM  7.8* 8.0* 8.1* 8.1*  MG 1.9 1.8  --   --    GFR: Estimated Creatinine Clearance: 82.9 mL/min (by C-G formula based on SCr of 0.61 mg/dL). Liver Function Tests: No results for input(s): AST, ALT, ALKPHOS, BILITOT, PROT, ALBUMIN in the last 168 hours. No results for input(s): LIPASE, AMYLASE in the last 168 hours. No results for input(s): AMMONIA in the last 168 hours. Coagulation Profile: No results for input(s): INR, PROTIME in the last 168 hours. Cardiac Enzymes: No results for input(s): CKTOTAL, CKMB, CKMBINDEX, TROPONINI in the last 168 hours. BNP (last 3 results) No results for input(s): PROBNP in the last 8760 hours. HbA1C: No results for input(s): HGBA1C in the last 72 hours. CBG: No results for input(s): GLUCAP in the last 168 hours. Lipid Profile: No results for  input(s): CHOL, HDL, LDLCALC, TRIG, CHOLHDL, LDLDIRECT in the last 72 hours. Thyroid Function Tests: No results for input(s): TSH, T4TOTAL, FREET4, T3FREE, THYROIDAB in the last 72 hours. Anemia Panel: No results for input(s): VITAMINB12, FOLATE, FERRITIN, TIBC, IRON, RETICCTPCT in the last 72 hours. Urine analysis:    Component Value Date/Time   COLORURINE ORANGE (A) 08/10/2016 0328   APPEARANCEUR CLOUDY (A) 08/10/2016 0328   LABSPEC 1.022 08/10/2016 0328   PHURINE 6.0 08/10/2016 0328   GLUCOSEU NEGATIVE 08/10/2016 0328   HGBUR NEGATIVE 08/10/2016 0328   BILIRUBINUR SMALL (A) 08/10/2016 0328   KETONESUR NEGATIVE 08/10/2016 0328   PROTEINUR 30 (A) 08/10/2016 0328   UROBILINOGEN 1.0 08/06/2012 2046   NITRITE NEGATIVE 08/10/2016 0328   LEUKOCYTESUR TRACE (A) 08/10/2016 0328   Sepsis Labs: @LABRCNTIP (procalcitonin:4,lacticidven:4)   Recent Results (from the past 240 hour(s))  Culture, blood (routine x 2)     Status: None   Collection Time: 08/10/16 12:33 AM  Result Value Ref Range Status   Specimen Description BLOOD RIGHT HAND  Final   Special Requests BOTTLES DRAWN AEROBIC AND ANAEROBIC 5CC  Final   Culture   Final    NO GROWTH 5 DAYS Performed at [facility]    Report Status 08/15/2016 FINAL  Final  Culture, blood (routine x 2)     Status: None   Collection Time: 08/10/16 12:33 AM  Result Value Ref Range Status   Specimen Description BLOOD LEFT HAND  Final   Special Requests BOTTLES DRAWN AEROBIC AND ANAEROBIC 5ML  Final   Culture   Final    NO GROWTH 5 DAYS Performed at [facility]    Report Status 08/15/2016 FINAL  Final  Urine culture     Status: None   Collection Time: 08/10/16  3:28 AM  Result Value Ref Range Status   Specimen Description URINE, CLEAN CATCH  Final   Special Requests NONE  Final   Culture NO GROWTH Performed at [facility]   Final   Report Status 08/11/2016 FINAL  Final      Radiology Studies: Ct Tibia Fibula Left  W Contrast Result Date: 08/15/2016 Severe diffuse cellulitis without definite CT findings for myofasciitis or pyomyositis. No obvious discrete drainable rim enhancing abscess. No CT findings for septic arthritis or osteomyelitis. If symptoms persist or worsen MR is recommended as a much better imaging examination for this type of process.     Scheduled Meds: . cephALEXin  500 mg Oral Q8H  . enoxaparin (LOVENOX) injection  40 mg Subcutaneous Q24H  . gemfibrozil  600 mg Oral BID AC  . hydrocortisone   Topical BID  . pantoprazole  40 mg Oral Daily  .  polyethylene glycol  17 g Oral Daily  . saccharomyces boulardii  250 mg Oral BID  . senna  1 tablet Oral BID  . terbinafine   Topical BID  . vancomycin  750 mg Intravenous Q12H   Continuous Infusions: . sodium chloride 10 mL/hr at 08/13/16 1020     LOS: 8 days    Time spent: 15 minutes  Greater than 50% of the time spent on counseling and coordinating the care.   Velvet Bathe, MD [facility] Pager [phone]  If 7PM-7AM, please contact night-coverage [email or web address] Password [email or web address] 08/18/2016, 1:41 PM

## 2016-08-19 MED ORDER — DOXYCYCLINE HYCLATE 100 MG PO TABS
100.0000 mg | ORAL_TABLET | Freq: Two times a day (BID) | ORAL | Status: DC
  Administered 2016-08-19 – 2016-08-20 (×3): 100 mg via ORAL
  Filled 2016-08-19 (×3): qty 1

## 2016-08-19 NOTE — Care Management Note (Signed)
Case Management Note  Patient Details  Name: NUSAYBA RYBKA MRN: [id number] Date of Birth: [birth year]  Subjective/Objective:                  Cellulitis of left lower leg  Action/Plan: Discharge planning Expected Discharge Date:                  Expected Discharge Plan:  Home/Self Care  In-House Referral:     Discharge planning Services  CM Consult  Post Acute Care Choice:    Choice offered to:  Patient  DME Arranged:  N/A DME Agency:  NA  HH Arranged:  NA HH Agency:  NA  Status of Service:  Completed, signed off  If discussed at [place] of Stay Meetings, dates discussed:    Additional Comments: CM spoke with pt for discharge needs.  NO PT recc or ordered (pt can ambulate ind 350 ft per PT Eval.  Pt states her husband is supportive and she has 3 stepsons.  Pt denies need for any DME and declines Private Duty Agency List.  Pt is insured and has PCP.  No other CM needs were communicated. Dellie Catholic, RN 08/19/2016, 2:37 PM

## 2016-08-19 NOTE — Progress Notes (Signed)
Nutrition Brief Note  Patient identified to be seen for LOS of 9 days.  Wt Readings from Last 15 Encounters:  08/09/16 240 lb (108.9 kg)  06/10/16 237 lb (107.5 kg)  04/18/14 239 lb (108.4 kg)  02/09/13 242 lb (109.8 kg)  01/18/13 237 lb 4.8 oz (107.6 kg)  05/03/12 234 lb 12.6 oz (106.5 kg)    Body mass index is 38.74 kg/m. Patient meets criteria for Obesity Class II based on current BMI.   Current diet order is Regular, patient is consuming approximately 100% of meals at this time. Patient appears weight stable per weight history. No skin issues. Labs and medications reviewed.   No nutrition interventions warranted at this time. If nutrition issues arise, please consult RD.   Willey Blade, MS, RD, LDN Pager: [phone] After Hours Pager: [phone]

## 2016-08-19 NOTE — Progress Notes (Signed)
Patient ID: Kathleen Wiley, female   DOB: [birth year], 70 y.o.   MRN: [id number]  PROGRESS NOTE    JOSHA BERGSTRESSER  [id number] DOB: 1946-10-29 DOA: 08/09/2016  PCP: Lilian Coma, MD   Brief Narrative:  70 year old female with a past medical history of GERD, hypertension who presented to [facility] with fever, chills and complains of pain in the left leg with redness. CT of the left lower extremity with contrast showed severe diffuse cellulitis without a definite findings for mild fasciitis or pyomyositis, no evidence of drainable abscess. Patient was started on vancomycin.    Assessment & Plan:   Principal Problem:   Cellulitis of left lower leg / leukopenia - Very slow improvement - No DVT on lower extremity doppler  - transition to oral antibiotics and make sure patient will tolerate new medication regimen prior to discharge. Start discharge planning. Discussed with family who will make preparations for discharge. - Blood cultures negative so far  Active Problems:   Essential hypertension, benign - At home pt takes maxzide but currently BP controlled without BP meds    Dyslipidemia - Continue gemfibrozil 600 mg twice daily stable  DVT prophylaxis: Lovenox subcutaneous Code Status: full code  Family Communication: No family at the bedside Disposition Plan: Home once cellulitis improves   Consultants:   None   Procedures:   Bilateral LE doppler 10/4 - no DVT   Antimicrobials:    Vancomycin 08/09/2016 -->   Zosyn 08/09/2016 --> 08/17/2016   Cephalosporin 10/11   Subjective: Pt has no new complaints reported. No acute issues reported overnight. Has had difficulty with doxycycline in the past but she feels it is because she drank it prior to going to sleep and subsequently developed some burning in her esophagus. She would like to try doxycycline again and would like to start discussing with family discharge planning so she can go home tomorrow.  Objective: Vitals:   08/18/16 1328 08/18/16 2052 08/19/16 0546 08/19/16 1400  BP: (!) 123/59 129/70 138/69 128/65  Pulse: 69 72 70 64  Resp: 18 18 16 15   Temp: 98.5 F (36.9 C) 98.4 F (36.9 C) 98.2 F (36.8 C) 98.7 F (37.1 C)  TempSrc: Oral Oral Oral Oral  SpO2: 98% 96% 96% 97%  Weight:      Height:        Intake/Output Summary (Last 24 hours) at 08/19/16 1506 Last data filed at 08/19/16 1031  Gross per 24 hour  Intake             1310 ml  Output              800 ml  Net              510 ml   Filed Weights   08/09/16 2143  Weight: 108.9 kg (240 lb)    Examination:  General exam: Pt in NAD, alert and awake Respiratory system: No wheezing, no rhonchi , equal chest rise. Cardiovascular system: S1 & S2 heard, Rate controlled .  Gastrointestinal system: (+) BS, non tender  Central nervous system: No focal neurological deficits. Answers questions appropriately Extremities: LLE swollen, red up to the knee from ankle but seems better since yesterday   Skin: warm, dry Psychiatry: Normal mood and behavior.   Data Reviewed: I have personally reviewed following labs and imaging studies  CBC:  Recent Labs Lab 08/13/16 0519 08/15/16 0445 08/17/16 0439 08/18/16 0442  WBC 4.1 3.9* 3.9* 3.7*  HGB 10.2* 10.1* 10.3* 10.6*  HCT 30.8* 30.1* 32.0* 32.7*  MCV 97.8 98.4 100.3* 99.1  PLT 161 191 178 [id number]   Basic Metabolic Panel:  Recent Labs Lab 08/13/16 0519 08/15/16 0445 08/17/16 0439  NA 139 139 139  K 3.5 3.7 3.8  CL 109 109 109  CO2 23 23 21*  GLUCOSE 108* 116* 113*  BUN 9 10 9   CREATININE 0.64 0.63 0.61  CALCIUM 8.0* 8.1* 8.1*  MG 1.8  --   --    GFR: Estimated Creatinine Clearance: 82.9 mL/min (by C-G formula based on SCr of 0.61 mg/dL). Liver Function Tests: No results for input(s): AST, ALT, ALKPHOS, BILITOT, PROT, ALBUMIN in the last 168 hours. No results for input(s): LIPASE, AMYLASE in the last 168 hours. No results for input(s): AMMONIA in the last 168 hours. Coagulation  Profile: No results for input(s): INR, PROTIME in the last 168 hours. Cardiac Enzymes: No results for input(s): CKTOTAL, CKMB, CKMBINDEX, TROPONINI in the last 168 hours. BNP (last 3 results) No results for input(s): PROBNP in the last 8760 hours. HbA1C: No results for input(s): HGBA1C in the last 72 hours. CBG: No results for input(s): GLUCAP in the last 168 hours. Lipid Profile: No results for input(s): CHOL, HDL, LDLCALC, TRIG, CHOLHDL, LDLDIRECT in the last 72 hours. Thyroid Function Tests: No results for input(s): TSH, T4TOTAL, FREET4, T3FREE, THYROIDAB in the last 72 hours. Anemia Panel: No results for input(s): VITAMINB12, FOLATE, FERRITIN, TIBC, IRON, RETICCTPCT in the last 72 hours. Urine analysis:    Component Value Date/Time   COLORURINE ORANGE (A) 08/10/2016 0328   APPEARANCEUR CLOUDY (A) 08/10/2016 0328   LABSPEC 1.022 08/10/2016 0328   PHURINE 6.0 08/10/2016 0328   GLUCOSEU NEGATIVE 08/10/2016 0328   HGBUR NEGATIVE 08/10/2016 0328   BILIRUBINUR SMALL (A) 08/10/2016 0328   KETONESUR NEGATIVE 08/10/2016 0328   PROTEINUR 30 (A) 08/10/2016 0328   UROBILINOGEN 1.0 08/06/2012 2046   NITRITE NEGATIVE 08/10/2016 0328   LEUKOCYTESUR TRACE (A) 08/10/2016 0328   Sepsis Labs: @LABRCNTIP (procalcitonin:4,lacticidven:4)   Recent Results (from the past 240 hour(s))  Culture, blood (routine x 2)     Status: None   Collection Time: 08/10/16 12:33 AM  Result Value Ref Range Status   Specimen Description BLOOD RIGHT HAND  Final   Special Requests BOTTLES DRAWN AEROBIC AND ANAEROBIC 5CC  Final   Culture   Final    NO GROWTH 5 DAYS Performed at [facility]    Report Status 08/15/2016 FINAL  Final  Culture, blood (routine x 2)     Status: None   Collection Time: 08/10/16 12:33 AM  Result Value Ref Range Status   Specimen Description BLOOD LEFT HAND  Final   Special Requests BOTTLES DRAWN AEROBIC AND ANAEROBIC 5ML  Final   Culture   Final    NO GROWTH 5  DAYS Performed at [facility]    Report Status 08/15/2016 FINAL  Final  Urine culture     Status: None   Collection Time: 08/10/16  3:28 AM  Result Value Ref Range Status   Specimen Description URINE, CLEAN CATCH  Final   Special Requests NONE  Final   Culture NO GROWTH Performed at [facility]   Final   Report Status 08/11/2016 FINAL  Final      Radiology Studies: Ct Tibia Fibula Left W Contrast Result Date: 08/15/2016 Severe diffuse cellulitis without definite CT findings for myofasciitis or pyomyositis. No obvious discrete drainable rim enhancing abscess. No CT findings for septic arthritis or  osteomyelitis. If symptoms persist or worsen MR is recommended as a much better imaging examination for this type of process.     Scheduled Meds: . cephALEXin  500 mg Oral Q8H  . doxycycline  100 mg Oral Q12H  . enoxaparin (LOVENOX) injection  40 mg Subcutaneous Q24H  . gemfibrozil  600 mg Oral BID AC  . hydrocortisone   Topical BID  . pantoprazole  40 mg Oral Daily  . polyethylene glycol  17 g Oral Daily  . saccharomyces boulardii  250 mg Oral BID  . senna  1 tablet Oral BID  . terbinafine   Topical BID   Continuous Infusions: . sodium chloride 10 mL/hr at 08/13/16 1020     LOS: 9 days    Time spent: > 22minutes  Greater than 50% of the time spent on counseling and coordinating the care.   Velvet Bathe, MD [facility] Pager [phone]  If 7PM-7AM, please contact night-coverage [email or web address] Password [email or web address] 08/19/2016, 3:06 PM

## 2016-08-20 MED ORDER — SENNA 8.6 MG PO TABS: 1.0000 | ORAL_TABLET | Freq: Every day | ORAL | 0 refills | Status: DC | PRN

## 2016-08-20 MED ORDER — OXYCODONE-ACETAMINOPHEN 5-325 MG PO TABS: 1.0000 | ORAL_TABLET | Freq: Four times a day (QID) | ORAL | 0 refills | Status: DC | PRN

## 2016-08-20 MED ORDER — DOXYCYCLINE HYCLATE 100 MG PO TABS: 100.0000 mg | ORAL_TABLET | Freq: Two times a day (BID) | ORAL | 0 refills | Status: AC

## 2016-08-20 MED ORDER — CEPHALEXIN 500 MG PO CAPS: 500.0000 mg | ORAL_CAPSULE | Freq: Three times a day (TID) | ORAL | 0 refills | Status: AC

## 2016-08-20 NOTE — Progress Notes (Signed)
Physical Therapy Treatment Patient Details Name: Kathleen Wiley MRN: [id number] DOB: [birth year] Today's Date: 08/20/2016    History of Present Illness 70 year old Caucasian female with a past medical history of GERD, hypertension, presented with fever, chills and complains of pain in the left leg with redness. Patient was diagnosed as having left lower extremity cellulitis and she was hospitalized for further management    PT Comments    Pt sitting EOB on arrival.  Using walker for safety and balance during gait due to L LE pain (Cellulitis).  Practiced stairs.  Mobilizing well.  Plans to D/C Son's home today.   Follow Up Recommendations  No PT follow up     Equipment Recommendations  Rolling walker with 5" wheels (pt stated she might have given it away.  Was in the storage shed.  Might need to order one.  confirm with pt upond discharge. )    Recommendations for Other Services       Precautions / Restrictions Precautions Precautions: None Restrictions Weight Bearing Restrictions: No    Mobility  Bed Mobility               General bed mobility comments: Pt sitting EOB insep on arrival  Transfers Overall transfer level: Modified independent Equipment used: None;Rolling walker (2 wheeled) Transfers: Sit to/from [facility] Sit to Stand: Modified independent (Device/Increase time) Stand pivot transfers: Modified independent (Device/Increase time)       General transfer comment: good safety cognition and tech  Ambulation/Gait Ambulation/Gait assistance: Modified independent (Device/Increase time) Ambulation Distance (Feet): 225 Feet Assistive device: Rolling walker (2 wheeled) Gait Pattern/deviations: Step-through pattern     General Gait Details: good alternating gait and safe tech   Stairs Stairs: Yes Stairs assistance: Min guard Stair Management: Two rails;Step to pattern;Forwards Number of Stairs: 2 General stair comments: only one  initial VC needed for proper sequencing.  Pt remembered "up with the good, down with the bad" from her previous hip surgery.  Wheelchair Mobility    Modified Rankin (Stroke Patients Only)       Balance                                    Cognition Arousal/Alertness: Awake/alert Behavior During Therapy: WFL for tasks assessed/performed Overall Cognitive Status: Within Functional Limits for tasks assessed                      Exercises      General Comments        Pertinent Vitals/Pain Pain Assessment: 0-10 Pain Score: 5  Pain Location: LLE Pain Descriptors / Indicators: Tightness Pain Intervention(s): Monitored during session;Repositioned    Home Living                      Prior Function            PT Goals (current goals can now be found in the care plan section) Progress towards PT goals: Progressing toward goals    Frequency           PT Plan Other (comment) (pt has met maximal mobility level but plans to D/C from hospital today.  Will update LPT. )    Co-evaluation             End of Session   Activity Tolerance: Patient tolerated treatment well Patient left: in chair;with call bell/phone within reach;with nursing/sitter  in room     Time: 1021-1033 PT Time Calculation (min) (ACUTE ONLY): 12 min  Charges:  $Gait Training: 8-22 mins                    G Codes:      Rica Koyanagi  PTA [facility]  Acute  Rehab Pager      [phone]

## 2016-08-20 NOTE — Discharge Summary (Signed)
Physician Discharge Summary  Kathleen Wiley [id number] DOB: 20-Oct-1946 DOA: 08/09/2016  PCP: Lilian Coma, MD  Admit date: 08/09/2016 Discharge date: 08/20/2016  Time spent: > 35  minutes  Recommendations for Outpatient Follow-up:  1. Will continue antibiotics for another 7 days 2. Recommended patient f/u with infectious disease specialist. Will have secretary set this up.   Discharge Diagnoses:  Principal Problem:   Cellulitis of left lower leg Active Problems:   Essential hypertension, benign   Discharge Condition: stable  Diet recommendation: regular diet  Filed Weights   08/09/16 2143  Weight: 108.9 kg (240 lb)    History of present illness:  70 year old female with a past medical history of GERD, hypertension who presented to [facility] with fever, chills and complains of pain in the left leg with redness. CT of the left lower extremity with contrast showed severe diffuse cellulitis without a definite findings for mild fasciitis or pyomyositis, no evidence of drainable abscess.  Hospital Course:  Principal Problem:   Cellulitis of left lower leg / leukopenia - Very slow improvement - No DVT on lower extremity doppler  -  D/c on 7 more days of oral antibiotics. Pt has had slow improvement in condition. - Blood cultures negative so far  Active Problems:   Essential hypertension, benign - continue home medication regimen    Dyslipidemia - Continue gemfibrozil 600 mg twice daily stable  Procedures:  None  Consultations:  None  Discharge Exam: Vitals:   08/19/16 2217 08/20/16 0527  BP: 137/69 140/63  Pulse: 67 74  Resp: 16 16  Temp: 98.2 F (36.8 C) 98.6 F (37 C)    General: Pt in nad, alert and awake Cardiovascular: rrr, no rubs Respiratory: no increased wob, no wheezes  Discharge Instructions   Discharge Instructions    Call MD for:  difficulty breathing, headache or visual disturbances    Complete by:  As directed    Call MD for:   extreme fatigue    Complete by:  As directed    Call MD for:  temperature >100.4    Complete by:  As directed    Diet - low sodium heart healthy    Complete by:  As directed    Discharge instructions    Complete by:  As directed    Please be sure to follow up with your primary care physician in 1-2 weeks or sooner should any new concerns arise.   Increase activity slowly    Complete by:  As directed      Current Discharge Medication List    START taking these medications   Details  cephALEXin (KEFLEX) 500 MG capsule Take 1 capsule (500 mg total) by mouth every 8 (eight) hours. Qty: 21 capsule, Refills: 0    doxycycline (VIBRA-TABS) 100 MG tablet Take 1 tablet (100 mg total) by mouth every 12 (twelve) hours. Qty: 14 tablet, Refills: 0    senna (SENOKOT) 8.6 MG TABS tablet Take 1 tablet (8.6 mg total) by mouth daily as needed for mild constipation. Qty: 120 each, Refills: 0      CONTINUE these medications which have CHANGED   Details  oxyCODONE-acetaminophen (PERCOCET) 5-325 MG tablet Take 1-2 tablets by mouth every 6 (six) hours as needed for moderate pain or severe pain. Qty: 30 tablet, Refills: 0      CONTINUE these medications which have NOT CHANGED   Details  cholecalciferol (VITAMIN D) 1000 units tablet Take 1,000 Units by mouth daily.    Cyanocobalamin 2500 MCG  TABS Take 1 tablet by mouth daily.    gemfibrozil (LOPID) 600 MG tablet Take 600 mg by mouth 2 (two) times daily before a meal.    omeprazole (PRILOSEC) 20 MG capsule Take 20 mg by mouth daily.    traMADol (ULTRAM) 50 MG tablet Take 50 mg by mouth every 6 (six) hours as needed for moderate pain.    triamterene-hydrochlorothiazide (MAXZIDE) 75-50 MG per tablet Take 1 tablet by mouth daily.      STOP taking these medications     ibuprofen (ADVIL,MOTRIN) 800 MG tablet        Allergies  Allergen Reactions  . Other     Refuses whole blood but does accept albumin  . Metronidazole Other (See Comments)     Chest pain, A-fib   . Septra [Sulfamethoxazole-Trimethoprim] Other (See Comments)    Blisters       The results of significant diagnostics from this hospitalization (including imaging, microbiology, ancillary and laboratory) are listed below for reference.    Significant Diagnostic Studies: Dg Chest 2 View  Result Date: 08/10/2016 CLINICAL DATA:  Fever and left leg swelling for 24 hours. Shortness of breath. EXAM: CHEST  2 VIEW COMPARISON:  08/06/2012 FINDINGS: Shallow inspiration. Cardiac enlargement. Pulmonary vascularity is normal. Lungs are clear. No focal airspace disease or consolidation. No blunting of costophrenic angles. No pneumothorax. Degenerative changes in the spine. IMPRESSION: Cardiac enlargement.  No evidence of active pulmonary disease. Electronically Signed   By: Lucienne Capers M.D.   On: 08/10/2016 01:22   Ct Tibia Fibula Left W Contrast  Result Date: 08/15/2016 CLINICAL DATA:  Pain, swelling and redness from knee to ankle for 1 week. EXAM: CT OF THE LOWER LEFT EXTREMITY WITH CONTRAST TECHNIQUE: Multidetector CT imaging of the lower left extremity was performed according to the standard protocol following intravenous contrast administration. COMPARISON:  None. CONTRAST:  114mL ISOVUE-300 IOPAMIDOL (ISOVUE-300) INJECTION 61% FINDINGS: Diffuse subcutaneous edema/inflammation/fluid involving the entire right lower extremity below the knee. Findings consistent with cellulitis. I do not see any definite findings for myofasciitis or pyomyositis. No definite rim enhancing fluid collection to suggest a drainable abscess. Focal area of bulging of the scan laterally at the junction of the middle and distal thirds of the fibula. This could be a skin blister. No definite CT findings to suggest septic arthritis or osteomyelitis. IMPRESSION: Severe diffuse cellulitis without definite CT findings for myofasciitis or pyomyositis. No obvious discrete drainable rim enhancing abscess. No CT  findings for septic arthritis or osteomyelitis. If symptoms persist or worsen MR is recommended as a much better imaging examination for this type of process. Electronically Signed   By: Marijo Sanes M.D.   On: 08/15/2016 15:07    Microbiology: No results found for this or any previous visit (from the past 240 hour(s)).   Labs: Basic Metabolic Panel:  Recent Labs Lab 08/15/16 0445 08/17/16 0439  NA 139 139  K 3.7 3.8  CL 109 109  CO2 23 21*  GLUCOSE 116* 113*  BUN 10 9  CREATININE 0.63 0.61  CALCIUM 8.1* 8.1*   Liver Function Tests: No results for input(s): AST, ALT, ALKPHOS, BILITOT, PROT, ALBUMIN in the last 168 hours. No results for input(s): LIPASE, AMYLASE in the last 168 hours. No results for input(s): AMMONIA in the last 168 hours. CBC:  Recent Labs Lab 08/15/16 0445 08/17/16 0439 08/18/16 0442  WBC 3.9* 3.9* 3.7*  HGB 10.1* 10.3* 10.6*  HCT 30.1* 32.0* 32.7*  MCV 98.4 100.3* 99.1  PLT 191 178 216   Cardiac Enzymes: No results for input(s): CKTOTAL, CKMB, CKMBINDEX, TROPONINI in the last 168 hours. BNP: BNP (last 3 results) No results for input(s): BNP in the last 8760 hours.  ProBNP (last 3 results) No results for input(s): PROBNP in the last 8760 hours.  CBG: No results for input(s): GLUCAP in the last 168 hours.  Signed:  Velvet Bathe MD.  [facility] 08/20/2016, 12:36 PM

## 2016-08-29 DIAGNOSIS — I70243 Atherosclerosis of native arteries of left leg with ulceration of ankle: Secondary | ICD-10-CM | POA: Insufficient documentation

## 2016-09-17 DIAGNOSIS — I83009 Varicose veins of unspecified lower extremity with ulcer of unspecified site: Secondary | ICD-10-CM | POA: Insufficient documentation

## 2016-09-17 DIAGNOSIS — L97909 Non-pressure chronic ulcer of unspecified part of unspecified lower leg with unspecified severity: Secondary | ICD-10-CM | POA: Insufficient documentation

## 2016-11-29 ENCOUNTER — Other Ambulatory Visit: Payer: Self-pay | Admitting: Family Medicine

## 2016-11-29 ENCOUNTER — Other Ambulatory Visit (HOSPITAL_COMMUNITY)
Admission: RE | Admit: 2016-11-29 | Discharge: 2016-11-29 | Disposition: A | Discharge status: Home / Self Care | Payer: Medicare Other | Source: Ambulatory Visit | Attending: Family Medicine | Admitting: Family Medicine

## 2016-11-29 DIAGNOSIS — Z01411 Encounter for gynecological examination (general) (routine) with abnormal findings: Secondary | ICD-10-CM | POA: Insufficient documentation

## 2016-11-29 DIAGNOSIS — Z1151 Encounter for screening for human papillomavirus (HPV): Secondary | ICD-10-CM | POA: Diagnosis present

## 2016-12-01 LAB — CYTOLOGY - PAP
Diagnosis: NEGATIVE
HPV (WINDOPATH): NOT DETECTED

## 2017-04-06 ENCOUNTER — Other Ambulatory Visit: Payer: Self-pay | Admitting: Family Medicine

## 2017-04-06 DIAGNOSIS — Z1231 Encounter for screening mammogram for malignant neoplasm of breast: Secondary | ICD-10-CM

## 2017-04-26 ENCOUNTER — Ambulatory Visit
Admission: RE | Admit: 2017-04-26 | Discharge: 2017-04-26 | Disposition: A | Discharge status: Home / Self Care | Payer: Medicare Other | Source: Ambulatory Visit | Attending: Family Medicine | Admitting: Family Medicine

## 2017-04-26 DIAGNOSIS — Z1231 Encounter for screening mammogram for malignant neoplasm of breast: Secondary | ICD-10-CM

## 2018-01-12 ENCOUNTER — Encounter (HOSPITAL_COMMUNITY): Payer: Self-pay | Admitting: Emergency Medicine

## 2018-01-12 ENCOUNTER — Other Ambulatory Visit: Payer: Self-pay

## 2018-01-12 ENCOUNTER — Emergency Department (HOSPITAL_COMMUNITY): Payer: Medicare Other

## 2018-01-12 DIAGNOSIS — Z5321 Procedure and treatment not carried out due to patient leaving prior to being seen by health care provider: Secondary | ICD-10-CM | POA: Insufficient documentation

## 2018-01-12 DIAGNOSIS — R079 Chest pain, unspecified: Secondary | ICD-10-CM | POA: Diagnosis not present

## 2018-01-12 LAB — BASIC METABOLIC PANEL
ANION GAP: 13 (ref 5–15)
BUN: 20 mg/dL (ref 6–20)
CHLORIDE: 103 mmol/L (ref 101–111)
CO2: 23 mmol/L (ref 22–32)
Calcium: 8.8 mg/dL — ABNORMAL LOW (ref 8.9–10.3)
Creatinine, Ser: 0.92 mg/dL (ref 0.44–1.00)
GFR calc Af Amer: >60 mL/min (ref >60)
GFR calc non Af Amer: >60 mL/min (ref >60)
GLUCOSE: 149 mg/dL — AB (ref 65–99)
POTASSIUM: 3 mmol/L — AB (ref 3.5–5.1)
Sodium: 139 mmol/L (ref 135–145)

## 2018-01-12 LAB — CBC
HEMATOCRIT: 38.4 % (ref 36.0–46.0)
HEMOGLOBIN: 13.2 g/dL (ref 12.0–15.0)
MCH: 32.5 pg (ref 26.0–34.0)
MCHC: 34.4 g/dL (ref 30.0–36.0)
MCV: 94.6 fL (ref 78.0–100.0)
Platelets: 187 10*3/uL (ref 150–400)
RBC: 4.06 MIL/uL (ref 3.87–5.11)
RDW: 13.7 % (ref 11.5–15.5)
WBC: 5.1 10*3/uL (ref 4.0–10.5)

## 2018-01-12 LAB — I-STAT TROPONIN, ED: Troponin i, poc: 0 ng/mL (ref 0.00–0.08)

## 2018-01-12 IMAGING — DX DG CHEST 2V
2 series · 2 of 2 positions shown · non-contrast
Comparison: [DATE]

CLINICAL DATA: Abnormal EKG with intermittent chest pain and
palpitations for the past week.

EXAM:
CHEST - 2 VIEW

[chest pa]
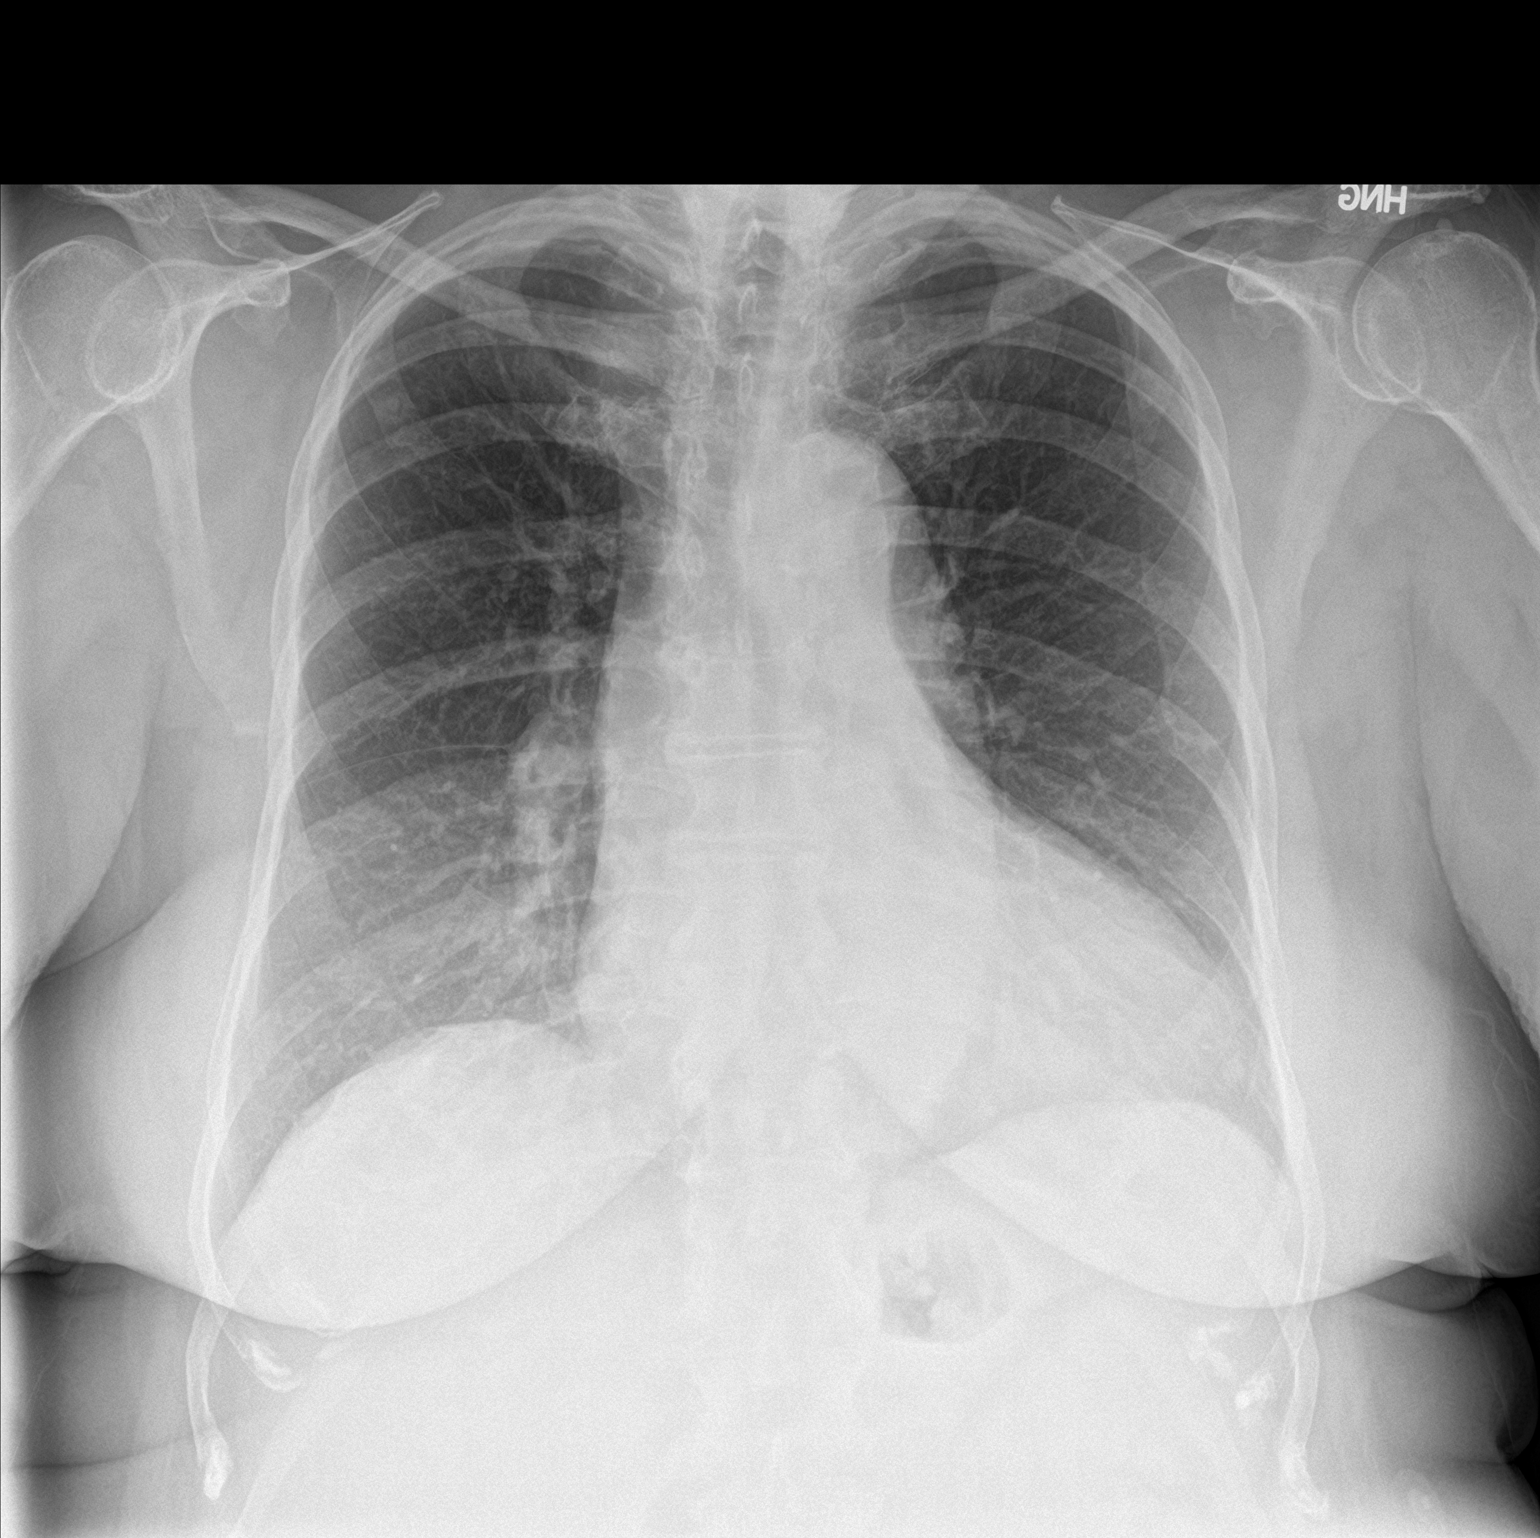

[chest lat]
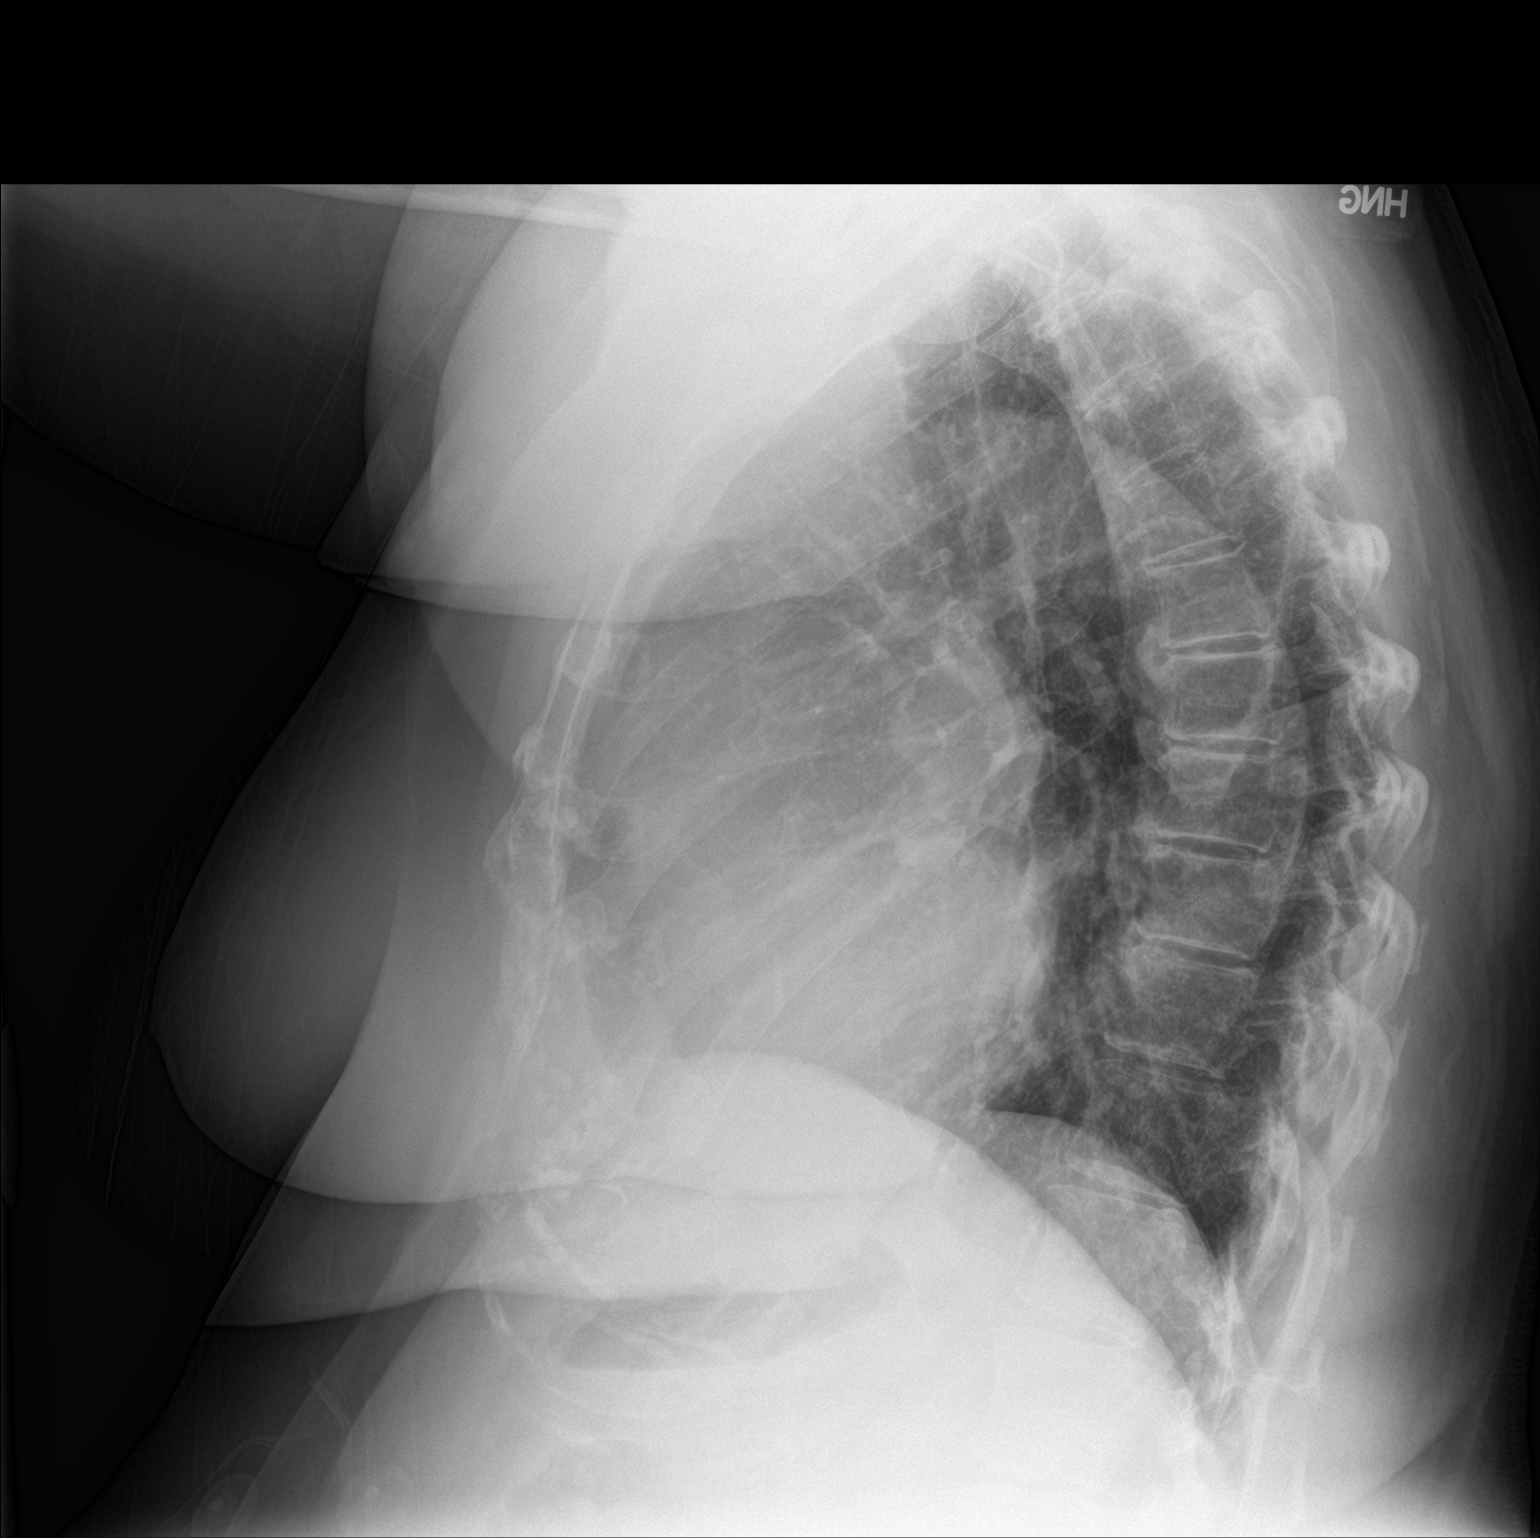

[2 of 2 positions shown; findings below may reference images not displayed]

FINDINGS: Stable cardiomegaly with mild uncoiling of the thoracic aorta. There
is minimal aortic atherosclerosis at the arch. Lungs are clear.
Calcific rotator cuff tendinopathy is noted on the left. No acute
osseous appearing abnormality. Mild degenerative disc disease along
the dorsal spine.
IMPRESSION: 1. Stable cardiomegaly without active pulmonary disease.
2. Aortic atherosclerosis.

## 2018-01-12 NOTE — ED Triage Notes (Signed)
Pt was sent to ED by [facility] physician for further evaluation of abnormal EKG, pt is been having intermittent cp for the past week and palpitation. Pt denies any cp or SOB at this time.

## 2018-01-13 ENCOUNTER — Emergency Department (HOSPITAL_COMMUNITY)
Admission: EM | Admit: 2018-01-13 | Discharge: 2018-01-13 | Disposition: A | Discharge status: Home / Self Care | Payer: Medicare Other | Attending: Emergency Medicine | Admitting: Emergency Medicine

## 2018-01-13 NOTE — ED Notes (Signed)
Pt has decided not to wait any longer. Pt is encouraged to stay

## 2018-08-25 ENCOUNTER — Other Ambulatory Visit: Payer: Self-pay | Admitting: Orthopedic Surgery

## 2018-09-05 NOTE — Patient Instructions (Addendum)
Kathleen Wiley  09/05/2018   Your procedure is scheduled on: Monday 09/18/2018  Report to [facility] Main  Entrance              Report to admitting at   [place] AM    Call this number if you have problems the morning of surgery [phone]    Remember: Do not eat food or drink liquids :After Midnight.               BRUSH YOUR TEETH MORNING OF SURGERY AND RINSE YOUR MOUTH OUT, NO CHEWING GUM CANDY OR MINTS.     Take these medicines the morning of surgery with A SIP OF WATER: Omeprazole (prilosec)                                You may not have any metal on your body including hair pins and              piercings  Do not wear jewelry, make-up, lotions, powders or perfumes, deodorant             Do not wear nail polish.  Do not shave  48 hours prior to surgery.               Do not bring valuables to the hospital. [place].  Contacts, dentures or bridgework may not be worn into surgery.  Leave suitcase in the car. After surgery it may be brought to your room.                  Please read over the following fact sheets you were given: _____________________________________________________________________             [facility] - Preparing for Surgery Before surgery, you can play an important role.  Because skin is not sterile, your skin needs to be as free of germs as possible.  You can reduce the number of germs on your skin by washing with CHG (chlorahexidine gluconate) soap before surgery.  CHG is an antiseptic cleaner which kills germs and bonds with the skin to continue killing germs even after washing. Please DO NOT use if you have an allergy to CHG or antibacterial soaps.  If your skin becomes reddened/irritated stop using the CHG and inform your nurse when you arrive at Short Stay. Do not shave (including legs and underarms) for at least 48 hours prior to the first CHG shower.  You may shave your  face/neck. Please follow these instructions carefully:  1.  Shower with CHG Soap the night before surgery and the  morning of Surgery.  2.  If you choose to wash your hair, wash your hair first as usual with your  normal  shampoo.  3.  After you shampoo, rinse your hair and body thoroughly to remove the  shampoo.                           4.  Use CHG as you would any other liquid soap.  You can apply chg directly  to the skin and wash  Gently with a scrungie or clean washcloth.  5.  Apply the CHG Soap to your body ONLY FROM THE NECK DOWN.   Do not use on face/ open                           Wound or open sores. Avoid contact with eyes, ears mouth and genitals (private parts).                       Wash face,  Genitals (private parts) with your normal soap.             6.  Wash thoroughly, paying special attention to the area where your surgery  will be performed.  7.  Thoroughly rinse your body with warm water from the neck down.  8.  DO NOT shower/wash with your normal soap after using and rinsing off  the CHG Soap.                9.  Pat yourself dry with a clean towel.            10.  Wear clean pajamas.            11.  Place clean sheets on your bed the night of your first shower and do not  sleep with pets. Day of Surgery : Do not apply any lotions/deodorants the morning of surgery.  Please wear clean clothes to the hospital/surgery center.  FAILURE TO FOLLOW THESE INSTRUCTIONS MAY RESULT IN THE CANCELLATION OF YOUR SURGERY PATIENT SIGNATURE_________________________________  NURSE SIGNATURE__________________________________  ________________________________________________________________________   Kathleen Wiley  An incentive spirometer is a tool that can help keep your lungs clear and active. This tool measures how well you are filling your lungs with each breath. Taking long deep breaths may help reverse or decrease the chance of developing breathing  (pulmonary) problems (especially infection) following:  A long period of time when you are unable to move or be active. BEFORE THE PROCEDURE   If the spirometer includes an indicator to show your best effort, your nurse or respiratory therapist will set it to a desired goal.  If possible, sit up straight or lean slightly forward. Try not to slouch.  Hold the incentive spirometer in an upright position. INSTRUCTIONS FOR USE  1. Sit on the edge of your bed if possible, or sit up as far as you can in bed or on a chair. 2. Hold the incentive spirometer in an upright position. 3. Breathe out normally. 4. Place the mouthpiece in your mouth and seal your lips tightly around it. 5. Breathe in slowly and as deeply as possible, raising the piston or the ball toward the top of the column. 6. Hold your breath for 3-5 seconds or for as long as possible. Allow the piston or ball to fall to the bottom of the column. 7. Remove the mouthpiece from your mouth and breathe out normally. 8. Rest for a few seconds and repeat Steps 1 through 7 at least 10 times every 1-2 hours when you are awake. Take your time and take a few normal breaths between deep breaths. 9. The spirometer may include an indicator to show your best effort. Use the indicator as a goal to work toward during each repetition. 10. After each set of 10 deep breaths, practice coughing to be sure your lungs are clear. If you have an incision (the cut made at the time of surgery),  support your incision when coughing by placing a pillow or rolled up towels firmly against it. Once you are able to get out of bed, walk around indoors and cough well. You may stop using the incentive spirometer when instructed by your caregiver.  RISKS AND COMPLICATIONS  Take your time so you do not get dizzy or light-headed.  If you are in pain, you may need to take or ask for pain medication before doing incentive spirometry. It is harder to take a deep breath if you  are having pain. AFTER USE  Rest and breathe slowly and easily.  It can be helpful to keep track of a log of your progress. Your caregiver can provide you with a simple table to help with this. If you are using the spirometer at home, follow these instructions: [place] IF:   You are having difficultly using the spirometer.  You have trouble using the spirometer as often as instructed.  Your pain medication is not giving enough relief while using the spirometer.  You develop fever of 100.5 F (38.1 C) or higher. SEEK IMMEDIATE MEDICAL CARE IF:   You cough up bloody sputum that had not been present before.  You develop fever of 102 F (38.9 C) or greater.  You develop worsening pain at or near the incision site. MAKE SURE YOU:   Understand these instructions.  Will watch your condition.  Will get help right away if you are not doing well or get worse. Document Released: 03/07/2007 Document Revised: 01/17/2012 Document Reviewed: 05/08/2007 [facility] Patient Information 2014 [place], [place].   ________________________________________________________________________

## 2018-09-13 ENCOUNTER — Other Ambulatory Visit: Payer: Self-pay

## 2018-09-13 ENCOUNTER — Encounter (HOSPITAL_COMMUNITY)
Admission: RE | Admit: 2018-09-13 | Discharge: 2018-09-13 | Disposition: A | Discharge status: Home / Self Care | Payer: Medicare Other | Source: Ambulatory Visit | Attending: Orthopedic Surgery | Admitting: Orthopedic Surgery

## 2018-09-13 ENCOUNTER — Encounter (HOSPITAL_COMMUNITY): Payer: Self-pay

## 2018-09-13 DIAGNOSIS — Z01812 Encounter for preprocedural laboratory examination: Secondary | ICD-10-CM | POA: Insufficient documentation

## 2018-09-13 DIAGNOSIS — M1611 Unilateral primary osteoarthritis, right hip: Secondary | ICD-10-CM | POA: Insufficient documentation

## 2018-09-13 HISTORY — DX: Cellulitis, unspecified: L03.90

## 2018-09-13 LAB — CBC WITH DIFFERENTIAL/PLATELET
Abs Immature Granulocytes: 0.06 10*3/uL (ref 0.00–0.07)
BASOS ABS: 0 10*3/uL (ref 0.0–0.1)
BASOS PCT: 1 %
EOS ABS: 0.1 10*3/uL (ref 0.0–0.5)
Eosinophils Relative: 3 %
HCT: 40 % (ref 36.0–46.0)
Hemoglobin: 13.1 g/dL (ref 12.0–15.0)
IMMATURE GRANULOCYTES: 2 %
Lymphocytes Relative: 38 %
Lymphs Abs: 1.4 10*3/uL (ref 0.7–4.0)
MCH: 31.7 pg (ref 26.0–34.0)
MCHC: 32.8 g/dL (ref 30.0–36.0)
MCV: 96.9 fL (ref 80.0–100.0)
Monocytes Absolute: 0.3 10*3/uL (ref 0.1–1.0)
Monocytes Relative: 7 %
NEUTROS PCT: 49 %
NRBC: 0 % (ref 0.0–0.2)
Neutro Abs: 1.8 10*3/uL (ref 1.7–7.7)
Platelets: 205 10*3/uL (ref 150–400)
RBC: 4.13 MIL/uL (ref 3.87–5.11)
RDW: 13.7 % (ref 11.5–15.5)
WBC: 3.6 10*3/uL — AB (ref 4.0–10.5)

## 2018-09-13 LAB — URINALYSIS, ROUTINE W REFLEX MICROSCOPIC
Bilirubin Urine: NEGATIVE
Glucose, UA: NEGATIVE mg/dL
HGB URINE DIPSTICK: NEGATIVE
Ketones, ur: NEGATIVE mg/dL
Nitrite: NEGATIVE
Protein, ur: NEGATIVE mg/dL
SPECIFIC GRAVITY, URINE: 1.01 (ref 1.005–1.030)
pH: 6 (ref 5.0–8.0)

## 2018-09-13 LAB — BASIC METABOLIC PANEL
ANION GAP: 11 (ref 5–15)
BUN: 18 mg/dL (ref 8–23)
CO2: 27 mmol/L (ref 22–32)
Calcium: 9.3 mg/dL (ref 8.9–10.3)
Chloride: 99 mmol/L (ref 98–111)
Creatinine, Ser: 0.82 mg/dL (ref 0.44–1.00)
Glucose, Bld: 95 mg/dL (ref 70–99)
Potassium: 4.1 mmol/L (ref 3.5–5.1)
Sodium: 137 mmol/L (ref 135–145)

## 2018-09-13 LAB — APTT: aPTT: 31 seconds (ref 24–36)

## 2018-09-13 LAB — SURGICAL PCR SCREEN
MRSA, PCR: NEGATIVE
STAPHYLOCOCCUS AUREUS: NEGATIVE

## 2018-09-13 LAB — NO BLOOD PRODUCTS

## 2018-09-13 LAB — PROTIME-INR
INR: 1.09
Prothrombin Time: 14 seconds (ref 11.4–15.2)

## 2018-09-13 NOTE — Progress Notes (Signed)
   09/13/18 1156  OBSTRUCTIVE SLEEP APNEA  Have you ever been diagnosed with sleep apnea through a sleep study? No  Do you snore loudly (loud enough to be heard through closed doors)?  1  Do you often feel tired, fatigued, or sleepy during the daytime (such as falling asleep during driving or talking to someone)? 0  Has anyone observed you stop breathing during your sleep? 0  Do you have, or are you being treated for high blood pressure? 1  BMI more than 35 kg/m2? 1  Age > 50 (1-yes) 1  Neck circumference greater than:Female 16 inches or larger, Female 17inches or larger? 1  Female Gender (Yes=1) 0  Obstructive Sleep Apnea Score 5  Score 5 or greater  Results sent to PCP

## 2018-09-14 DIAGNOSIS — M1611 Unilateral primary osteoarthritis, right hip: Secondary | ICD-10-CM | POA: Diagnosis present

## 2018-09-14 NOTE — H&P (Signed)
TOTAL HIP ADMISSION H&P  Patient is admitted for right total hip arthroplasty.  Subjective:  Chief Complaint: right hip pain  HPI: Kathleen Wiley, 72 y.o. female, has a history of pain and functional disability in the right hip(s) due to arthritis and patient has failed non-surgical conservative treatments for greater than 12 weeks to include flexibility and strengthening excercises, use of assistive devices, weight reduction as appropriate and activity modification.  Onset of symptoms was gradual starting several years ago with gradually worsening course since that time.The patient noted no past surgery on the right hip(s).  Patient currently rates pain in the right hip at 10 out of 10 with activity. Patient has night pain, worsening of pain with activity and weight bearing, trendelenberg gait, pain that interfers with activities of daily living and pain with passive range of motion. Patient has evidence of subchondral cysts and joint space narrowing by imaging studies. This condition presents safety issues increasing the risk of falls.   There is no current active infection.  Patient Active Problem List   Diagnosis Date Noted  . Cellulitis of left lower leg 08/10/2016  . GERD (gastroesophageal reflux disease) 02/09/2013  . Essential hypertension, benign 02/09/2013  . Hypercholesterolemia 02/09/2013  . Unspecified constipation 02/09/2013  . Muscle spasm 02/09/2013  . Osteoarthritis of right knee 01/26/2013  . Osteoarthritis of left hip 05/10/2012   Past Medical History:  Diagnosis Date  . Arthritis    knee, hip  . Cellulitis 08/2016   left leg  . Complication of anesthesia   . Dysrhythmia    Afib due to Flagyl- 7 -2007- not problems since  . GERD (gastroesophageal reflux disease)   . Heart murmur    "slight "  informed years ago.-   . Hernia, umbilical   . History of atrial fibrillation 2007   with flagyl  . History of endometriosis   . Hypercholesteremia   . Hypertension    has not been to a cardiologist  . PONV (postoperative nausea and vomiting)   . Refusal of blood transfusions as patient is Jehovah's Witness     Past Surgical History:  Procedure Laterality Date  . APPENDECTOMY    . EXPLORATORY LAPAROTOMY  1973   for endometrosis  . I&D EXTREMITY Right 06/10/2016   Procedure: IRRIGATION AND DEBRIDEMENT THUMB FLEXOR SHEATH;  Surgeon: Leanora Cover, MD;  Location: [place];  Service: Orthopedics;  Laterality: Right;  . JOINT REPLACEMENT     Left Hip  . [place]  . rectovaginal fistula repair  1980  . TOTAL HIP ARTHROPLASTY  05/08/2012   Procedure: TOTAL HIP ARTHROPLASTY;  Surgeon: Kerin Salen, MD;  Location: [place];  Service: Orthopedics;  Laterality: Left;  . TOTAL KNEE ARTHROPLASTY Right 01/24/2013   Dr Mayer Camel  . TOTAL KNEE ARTHROPLASTY Right 01/24/2013   Procedure: RIGHT TOTAL KNEE ARTHROPLASTY;  Surgeon: Kerin Salen, MD;  Location: [place];  Service: Orthopedics;  Laterality: Right;    No current facility-administered medications for this encounter.    Current Outpatient Medications  Medication Sig Dispense Refill Last Dose  . cholecalciferol (VITAMIN D) 1000 units tablet Take 1,000 Units by mouth daily.   Past Week at Unknown time  . cyanocobalamin 1000 MCG tablet Take 1,000 mcg by mouth daily.    Past Week at Unknown time  . diphenhydrAMINE (BENADRYL) 25 MG tablet Take 25 mg by mouth at bedtime.     Marland Kitchen gemfibrozil (LOPID) 600 MG tablet Take 600 mg by mouth 2 (  two) times daily before a meal.   Past Week at Unknown time  . ibuprofen (ADVIL,MOTRIN) 200 MG tablet Take 600 mg by mouth 2 (two) times daily.     Marland Kitchen omeprazole (PRILOSEC) 20 MG capsule Take 20 mg by mouth daily.   Past Week at Unknown time  . Potassium 99 MG TABS Take 99 mg by mouth daily.     . traMADol (ULTRAM) 50 MG tablet Take 50 mg by mouth 2 (two) times daily.    Past Week at Unknown time  . triamterene-hydrochlorothiazide (MAXZIDE) 75-50 MG per tablet Take 1 tablet by mouth daily.    Past Week at Unknown time  . oxyCODONE-acetaminophen (PERCOCET) 5-325 MG tablet Take 1-2 tablets by mouth every 6 (six) hours as needed for moderate pain or severe pain. (Patient not taking: Reported on 09/08/2018) 30 tablet 0 Not Taking at Unknown time  . senna (SENOKOT) 8.6 MG TABS tablet Take 1 tablet (8.6 mg total) by mouth daily as needed for mild constipation. (Patient not taking: Reported on 09/08/2018) 120 each 0 Not Taking at Unknown time   Allergies  Allergen Reactions  . Other     Refuses whole blood but does accept albumin  . Metronidazole Other (See Comments)    Chest pain, A-fib   . Septra [Sulfamethoxazole-Trimethoprim] Other (See Comments)    Blisters     Social History   Tobacco Use  . Smoking status: Former Smoker    Packs/day: 1.00    Years: 10.00    Pack years: 10.00    Last attempt to quit: 11/08/1981    Years since quitting: 36.8  . Smokeless tobacco: Never Used  Substance Use Topics  . Alcohol use: No    Family History  Problem Relation Age of Onset  . Breast cancer Neg Hx      Review of Systems  Constitutional: Negative.   HENT: Negative.   Eyes: Positive for blurred vision.  Cardiovascular: Positive for leg swelling.       Htn  Gastrointestinal: Negative.   Genitourinary: Positive for urgency.       Poor bladder control  Musculoskeletal: Positive for joint pain and myalgias.  Skin: Negative.   Neurological: Negative.   Endo/Heme/Allergies: Bruises/bleeds easily.       Blood sugar problem  Psychiatric/Behavioral: Negative.     Objective:  Physical Exam  Constitutional: She is oriented to person, place, and time. She appears well-developed and well-nourished.  HENT:  Head: Normocephalic and atraumatic.  Eyes: Pupils are equal, round, and reactive to light.  Neck: Normal range of motion. Neck supple.  Cardiovascular: Intact distal pulses.  Respiratory: Effort normal.  Musculoskeletal: She exhibits tenderness.  Patient does not fact walk  with a profound right-sided limp.  Any attempts at internal rotation the right hip causes severe pain foot tap is mildly positive.  She is nontender over the greater trochanter.    Neurological: She is alert and oriented to person, place, and time.  Skin: Skin is warm and dry.  Psychiatric: She has a normal mood and affect. Her behavior is normal. Judgment and thought content normal.    Vital signs in last 24 hours:    Labs:   Estimated body mass index is 38.53 kg/m as calculated from the following:   Height as of 09/13/18: 5' 4.5" (1.638 m).   Weight as of 09/13/18: 103.4 kg.   Imaging Review Plain radiographs demonstrate  well-placed well fixed S-ROM stem on the left.  On the right she does  have near bone-on-bone arthritis with some subchondral cysts in the acetabulum.    Preoperative templating of the joint replacement has been completed, documented, and submitted to the Operating Room personnel in order to optimize intra-operative equipment management.     Assessment/Plan:  End stage arthritis, right hip(s).  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  The patient history, physical examination, clinical judgement of the provider and imaging studies are consistent with end stage degenerative joint disease of the right hip(s) and total hip arthroplasty is deemed medically necessary. The treatment options including medical management, injection therapy, arthroscopy and arthroplasty were discussed at length. The risks and benefits of total hip arthroplasty were presented and reviewed. The risks due to aseptic loosening, infection, stiffness, dislocation/subluxation,  thromboembolic complications and other imponderables were discussed.  The patient acknowledged the explanation, agreed to proceed with the plan and consent was signed. Patient is being admitted for inpatient treatment for surgery, pain control, PT, OT, prophylactic antibiotics, VTE prophylaxis,  progressive ambulation and ADL's and discharge planning.The patient is planning to be discharged home with home health services

## 2018-09-17 MED ORDER — TRANEXAMIC ACID 1000 MG/10ML IV SOLN
2000.0000 mg | INTRAVENOUS | Status: DC
  Filled 2018-09-17: qty 20

## 2018-09-17 MED ORDER — BUPIVACAINE LIPOSOME 1.3 % IJ SUSP
10.0000 mL | INTRAMUSCULAR | Status: DC
  Filled 2018-09-17: qty 20

## 2018-09-18 ENCOUNTER — Inpatient Hospital Stay (HOSPITAL_COMMUNITY): Payer: Medicare Other | Admitting: Anesthesiology

## 2018-09-18 ENCOUNTER — Other Ambulatory Visit: Payer: Self-pay

## 2018-09-18 ENCOUNTER — Inpatient Hospital Stay (HOSPITAL_COMMUNITY): Payer: Medicare Other

## 2018-09-18 ENCOUNTER — Inpatient Hospital Stay (HOSPITAL_COMMUNITY)
Admission: RE | Admit: 2018-09-18 | Discharge: 2018-09-19 | DRG: 470 | Disposition: A | Discharge status: Home Health | Payer: Medicare Other | Source: Ambulatory Visit | Attending: Orthopedic Surgery | Admitting: Orthopedic Surgery

## 2018-09-18 ENCOUNTER — Encounter (HOSPITAL_COMMUNITY)
Admission: RE | Disposition: A | Discharge status: Home Health | Payer: Self-pay | Source: Ambulatory Visit | Attending: Orthopedic Surgery

## 2018-09-18 ENCOUNTER — Encounter (HOSPITAL_COMMUNITY): Payer: Self-pay | Admitting: Emergency Medicine

## 2018-09-18 DIAGNOSIS — Z96651 Presence of right artificial knee joint: Secondary | ICD-10-CM | POA: Diagnosis present

## 2018-09-18 DIAGNOSIS — M1611 Unilateral primary osteoarthritis, right hip: Secondary | ICD-10-CM | POA: Diagnosis present

## 2018-09-18 DIAGNOSIS — Z531 Procedure and treatment not carried out because of patient's decision for reasons of belief and group pressure: Secondary | ICD-10-CM | POA: Diagnosis present

## 2018-09-18 DIAGNOSIS — E7800 Pure hypercholesterolemia, unspecified: Secondary | ICD-10-CM | POA: Diagnosis present

## 2018-09-18 DIAGNOSIS — Z79899 Other long term (current) drug therapy: Secondary | ICD-10-CM | POA: Diagnosis not present

## 2018-09-18 DIAGNOSIS — Z96642 Presence of left artificial hip joint: Secondary | ICD-10-CM | POA: Diagnosis present

## 2018-09-18 DIAGNOSIS — R011 Cardiac murmur, unspecified: Secondary | ICD-10-CM | POA: Diagnosis present

## 2018-09-18 DIAGNOSIS — I10 Essential (primary) hypertension: Secondary | ICD-10-CM | POA: Diagnosis present

## 2018-09-18 DIAGNOSIS — D62 Acute posthemorrhagic anemia: Secondary | ICD-10-CM | POA: Diagnosis not present

## 2018-09-18 DIAGNOSIS — M25559 Pain in unspecified hip: Secondary | ICD-10-CM

## 2018-09-18 DIAGNOSIS — Z87891 Personal history of nicotine dependence: Secondary | ICD-10-CM | POA: Diagnosis not present

## 2018-09-18 DIAGNOSIS — K219 Gastro-esophageal reflux disease without esophagitis: Secondary | ICD-10-CM | POA: Diagnosis present

## 2018-09-18 DIAGNOSIS — K429 Umbilical hernia without obstruction or gangrene: Secondary | ICD-10-CM | POA: Diagnosis present

## 2018-09-18 DIAGNOSIS — Z79891 Long term (current) use of opiate analgesic: Secondary | ICD-10-CM

## 2018-09-18 DIAGNOSIS — Z888 Allergy status to other drugs, medicaments and biological substances status: Secondary | ICD-10-CM

## 2018-09-18 DIAGNOSIS — Z96641 Presence of right artificial hip joint: Secondary | ICD-10-CM

## 2018-09-18 HISTORY — PX: TOTAL HIP ARTHROPLASTY: SHX124

## 2018-09-18 LAB — GLUCOSE, CAPILLARY: Glucose-Capillary: 115 mg/dL — ABNORMAL HIGH (ref 70–99)

## 2018-09-18 IMAGING — RF DG HIP (WITH PELVIS) OPERATIVE*R*
1 series · 3 of 3 positions shown · non-contrast
Comparison: None.

CLINICAL DATA: Right anterior hip replacement.

EXAM:
OPERATIVE right HIP (WITH PELVIS IF PERFORMED) 3 VIEWS
TECHNIQUE: Fluoroscopic spot image(s) were submitted for interpretation
post-operatively.
Radiation exposure index: 4.2358 mGy.

[Series 1: unknown protocol · 0.20mm/px · 3 of 3 slices shown]
[im 1/3]
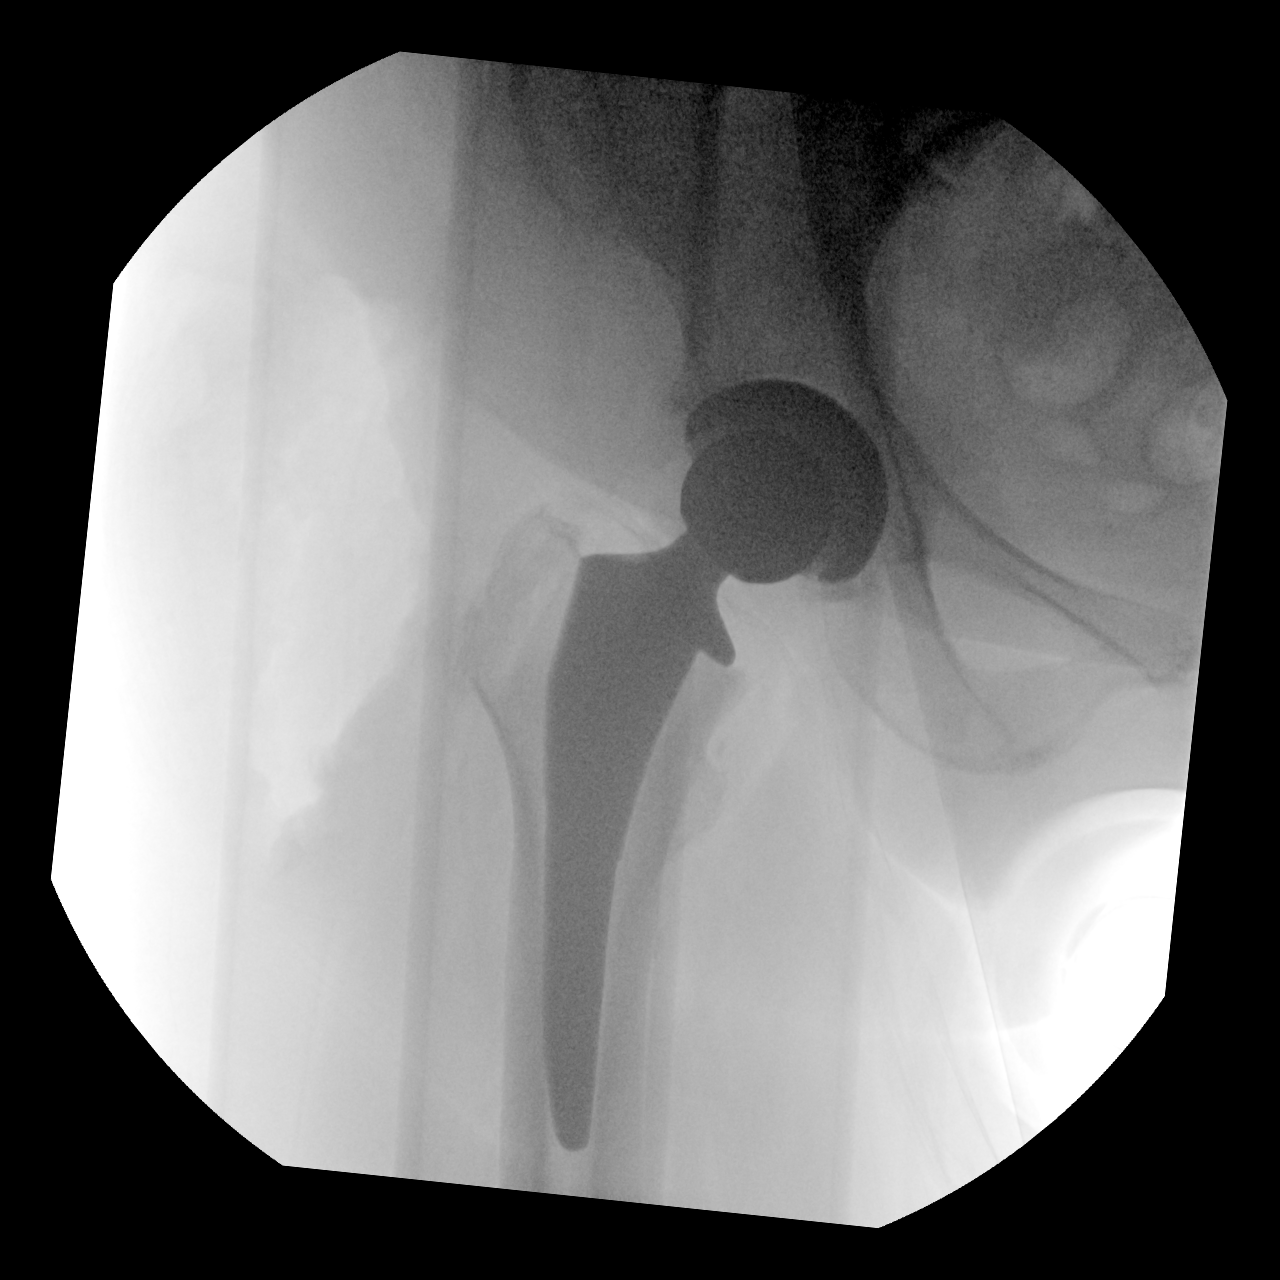
[im 2/3]
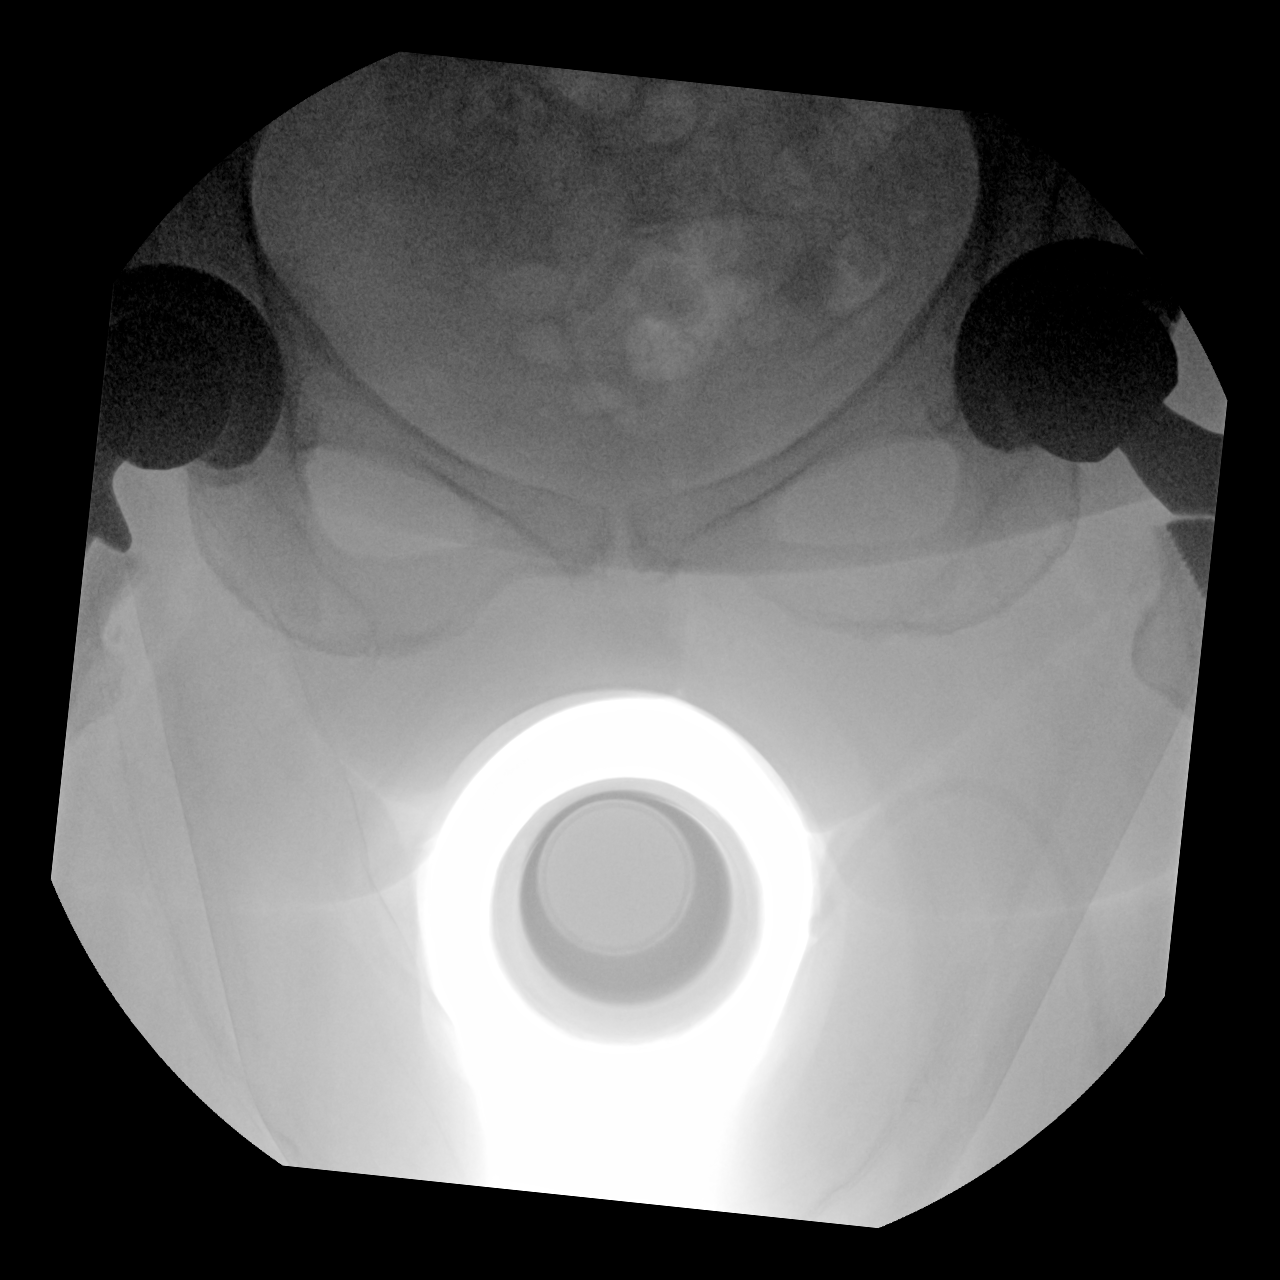
[im 3/3]
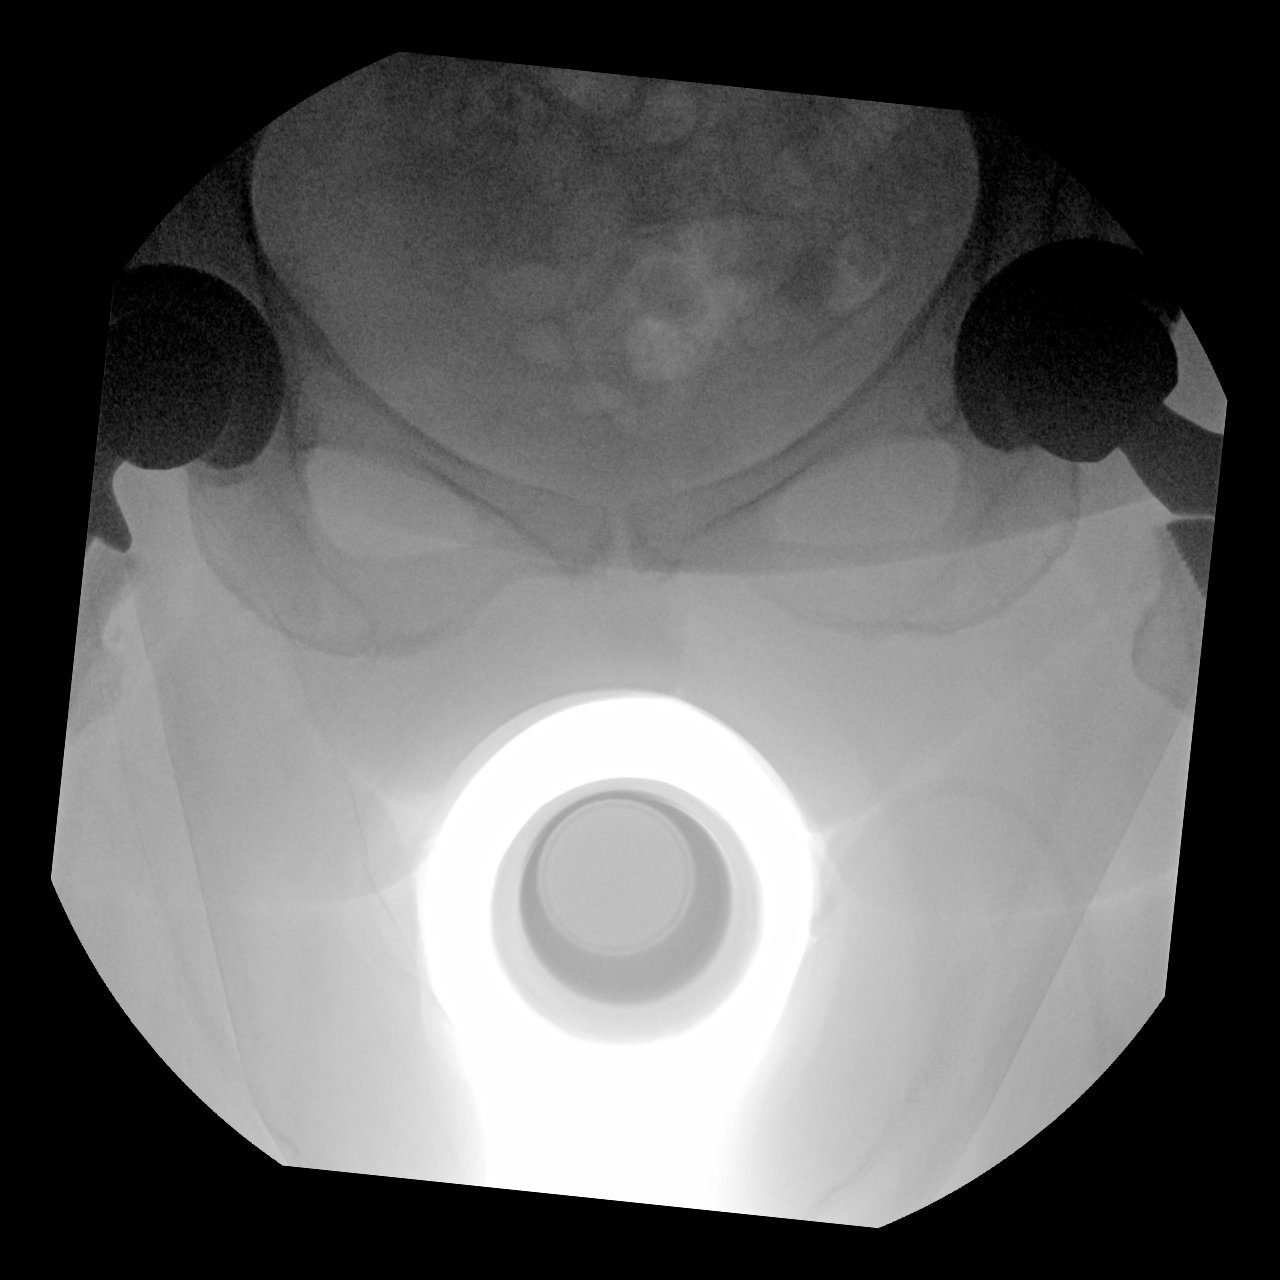

[3 of 3 positions shown; findings below may reference images not displayed]

FINDINGS: The femoral and acetabular components appear to be well situated.
Expected postoperative changes are noted in the surrounding soft
tissues. No fracture or dislocation is noted.
IMPRESSION: Status post right total hip arthroplasty.

## 2018-09-18 SURGERY — ARTHROPLASTY, HIP, TOTAL, ANTERIOR APPROACH
Anesthesia: Spinal | Site: Hip | Laterality: Right

## 2018-09-18 MED ORDER — ACETAMINOPHEN 325 MG PO TABS: 325.0000 mg | ORAL_TABLET | Freq: Four times a day (QID) | ORAL | Status: DC | PRN

## 2018-09-18 MED ORDER — TRANEXAMIC ACID-NACL 1000-0.7 MG/100ML-% IV SOLN: 1000.0000 mg | INTRAVENOUS | Status: DC

## 2018-09-18 MED ORDER — CEFAZOLIN SODIUM-DEXTROSE 2-4 GM/100ML-% IV SOLN
2.0000 g | INTRAVENOUS | Status: AC
  Administered 2018-09-18: 2 g via INTRAVENOUS
  Filled 2018-09-18: qty 100

## 2018-09-18 MED ORDER — DEXAMETHASONE SODIUM PHOSPHATE 10 MG/ML IJ SOLN
INTRAMUSCULAR | Status: DC | PRN
  Administered 2018-09-18: 10 mg via INTRAVENOUS

## 2018-09-18 MED ORDER — METOCLOPRAMIDE HCL 5 MG/ML IJ SOLN: 5.0000 mg | Freq: Three times a day (TID) | INTRAMUSCULAR | Status: DC | PRN

## 2018-09-18 MED ORDER — PROPOFOL 10 MG/ML IV BOLUS
INTRAVENOUS | Status: AC
  Filled 2018-09-18: qty 40

## 2018-09-18 MED ORDER — POTASSIUM GLUCONATE 595 (99 K) MG PO TABS
595.0000 mg | ORAL_TABLET | Freq: Every day | ORAL | Status: DC
  Administered 2018-09-18 – 2018-09-19 (×2): 595 mg via ORAL
  Filled 2018-09-18 (×2): qty 1

## 2018-09-18 MED ORDER — GABAPENTIN 300 MG PO CAPS
300.0000 mg | ORAL_CAPSULE | Freq: Three times a day (TID) | ORAL | Status: DC
  Administered 2018-09-18 – 2018-09-19 (×3): 300 mg via ORAL
  Filled 2018-09-18 (×3): qty 1

## 2018-09-18 MED ORDER — ONDANSETRON HCL 4 MG/2ML IJ SOLN
INTRAMUSCULAR | Status: AC
  Filled 2018-09-18: qty 2

## 2018-09-18 MED ORDER — METHOCARBAMOL 500 MG IVPB - SIMPLE MED
500.0000 mg | Freq: Four times a day (QID) | INTRAVENOUS | Status: DC | PRN
  Administered 2018-09-18: 500 mg via INTRAVENOUS
  Filled 2018-09-18: qty 50

## 2018-09-18 MED ORDER — ONDANSETRON HCL 4 MG/2ML IJ SOLN: 4.0000 mg | Freq: Four times a day (QID) | INTRAMUSCULAR | Status: DC | PRN

## 2018-09-18 MED ORDER — PROPOFOL 10 MG/ML IV BOLUS
INTRAVENOUS | Status: AC
  Filled 2018-09-18: qty 20

## 2018-09-18 MED ORDER — BUPIVACAINE IN DEXTROSE 0.75-8.25 % IT SOLN
INTRATHECAL | Status: DC | PRN
  Administered 2018-09-18: 1.8 mL via INTRATHECAL

## 2018-09-18 MED ORDER — BUPIVACAINE HCL (PF) 0.5 % IJ SOLN
INTRAMUSCULAR | Status: AC
  Filled 2018-09-18: qty 30

## 2018-09-18 MED ORDER — ACETAMINOPHEN 500 MG PO TABS
1000.0000 mg | ORAL_TABLET | Freq: Four times a day (QID) | ORAL | Status: AC
  Administered 2018-09-18 – 2018-09-19 (×4): 1000 mg via ORAL
  Filled 2018-09-18 (×4): qty 2

## 2018-09-18 MED ORDER — KCL IN DEXTROSE-NACL 20-5-0.45 MEQ/L-%-% IV SOLN
INTRAVENOUS | Status: DC
  Administered 2018-09-18: 17:00:00 via INTRAVENOUS
  Filled 2018-09-18 (×3): qty 1000

## 2018-09-18 MED ORDER — ALBUMIN HUMAN 5 % IV SOLN
INTRAVENOUS | Status: AC
  Filled 2018-09-18: qty 250

## 2018-09-18 MED ORDER — DOCUSATE SODIUM 100 MG PO CAPS
100.0000 mg | ORAL_CAPSULE | Freq: Two times a day (BID) | ORAL | Status: DC
  Administered 2018-09-18 – 2018-09-19 (×2): 100 mg via ORAL
  Filled 2018-09-18 (×2): qty 1

## 2018-09-18 MED ORDER — FENTANYL CITRATE (PF) 100 MCG/2ML IJ SOLN: 25.0000 ug | INTRAMUSCULAR | Status: DC | PRN

## 2018-09-18 MED ORDER — METOCLOPRAMIDE HCL 5 MG PO TABS: 5.0000 mg | ORAL_TABLET | Freq: Three times a day (TID) | ORAL | Status: DC | PRN

## 2018-09-18 MED ORDER — MENTHOL 3 MG MT LOZG: 1.0000 | LOZENGE | OROMUCOSAL | Status: DC | PRN

## 2018-09-18 MED ORDER — OXYCODONE HCL 5 MG PO TABS
5.0000 mg | ORAL_TABLET | ORAL | Status: DC | PRN
  Administered 2018-09-18: 10 mg via ORAL
  Administered 2018-09-18 – 2018-09-19 (×2): 5 mg via ORAL
  Filled 2018-09-18: qty 1
  Filled 2018-09-18: qty 2
  Filled 2018-09-18: qty 1

## 2018-09-18 MED ORDER — FENTANYL CITRATE (PF) 100 MCG/2ML IJ SOLN
INTRAMUSCULAR | Status: AC
  Filled 2018-09-18: qty 2

## 2018-09-18 MED ORDER — PROMETHAZINE HCL 25 MG/ML IJ SOLN: 6.2500 mg | INTRAMUSCULAR | Status: DC | PRN

## 2018-09-18 MED ORDER — TRANEXAMIC ACID 1000 MG/10ML IV SOLN
INTRAVENOUS | Status: DC | PRN
  Administered 2018-09-18: 2000 mg via TOPICAL

## 2018-09-18 MED ORDER — ALBUMIN HUMAN 5 % IV SOLN
INTRAVENOUS | Status: DC | PRN
  Administered 2018-09-18 (×2): via INTRAVENOUS

## 2018-09-18 MED ORDER — PANTOPRAZOLE SODIUM 40 MG PO TBEC
40.0000 mg | DELAYED_RELEASE_TABLET | Freq: Every day | ORAL | Status: DC
  Administered 2018-09-18 – 2018-09-19 (×2): 40 mg via ORAL
  Filled 2018-09-18 (×2): qty 1

## 2018-09-18 MED ORDER — SODIUM CHLORIDE (PF) 0.9 % IJ SOLN
INTRAMUSCULAR | Status: AC
  Filled 2018-09-18: qty 50

## 2018-09-18 MED ORDER — TRANEXAMIC ACID-NACL 1000-0.7 MG/100ML-% IV SOLN
1000.0000 mg | Freq: Once | INTRAVENOUS | Status: AC
  Administered 2018-09-18: 1000 mg via INTRAVENOUS
  Filled 2018-09-18: qty 100

## 2018-09-18 MED ORDER — DEXAMETHASONE SODIUM PHOSPHATE 10 MG/ML IJ SOLN
10.0000 mg | Freq: Once | INTRAMUSCULAR | Status: AC
  Administered 2018-09-19: 10 mg via INTRAVENOUS
  Filled 2018-09-18: qty 1

## 2018-09-18 MED ORDER — BISACODYL 5 MG PO TBEC: 5.0000 mg | DELAYED_RELEASE_TABLET | Freq: Every day | ORAL | Status: DC | PRN

## 2018-09-18 MED ORDER — DEXAMETHASONE SODIUM PHOSPHATE 10 MG/ML IJ SOLN
INTRAMUSCULAR | Status: AC
  Filled 2018-09-18: qty 1

## 2018-09-18 MED ORDER — BUPIVACAINE LIPOSOME 1.3 % IJ SUSP
INTRAMUSCULAR | Status: DC | PRN
  Administered 2018-09-18: 10 mL

## 2018-09-18 MED ORDER — METHOCARBAMOL 500 MG IVPB - SIMPLE MED
INTRAVENOUS | Status: AC
  Filled 2018-09-18: qty 50

## 2018-09-18 MED ORDER — TRANEXAMIC ACID-NACL 1000-0.7 MG/100ML-% IV SOLN
1000.0000 mg | INTRAVENOUS | Status: AC
  Administered 2018-09-18: 1000 mg via INTRAVENOUS
  Filled 2018-09-18 (×2): qty 100

## 2018-09-18 MED ORDER — ASPIRIN 81 MG PO CHEW
81.0000 mg | CHEWABLE_TABLET | Freq: Two times a day (BID) | ORAL | Status: DC
  Administered 2018-09-18 – 2018-09-19 (×2): 81 mg via ORAL
  Filled 2018-09-18 (×2): qty 1

## 2018-09-18 MED ORDER — SODIUM CHLORIDE 0.9% FLUSH
INTRAVENOUS | Status: DC | PRN
  Administered 2018-09-18: 50 mL

## 2018-09-18 MED ORDER — PHENOL 1.4 % MT LIQD: 1.0000 | OROMUCOSAL | Status: DC | PRN

## 2018-09-18 MED ORDER — 0.9 % SODIUM CHLORIDE (POUR BTL) OPTIME
TOPICAL | Status: DC | PRN
  Administered 2018-09-18: 1000 mL

## 2018-09-18 MED ORDER — OXYCODONE-ACETAMINOPHEN 5-325 MG PO TABS: 1.0000 | ORAL_TABLET | ORAL | 0 refills | Status: DC | PRN

## 2018-09-18 MED ORDER — BUPIVACAINE HCL (PF) 0.5 % IJ SOLN
INTRAMUSCULAR | Status: DC | PRN
  Administered 2018-09-18: 20 mL

## 2018-09-18 MED ORDER — PROPOFOL 10 MG/ML IV BOLUS
INTRAVENOUS | Status: AC
  Filled 2018-09-18: qty 60

## 2018-09-18 MED ORDER — BUPIVACAINE LIPOSOME 1.3 % IJ SUSP
20.0000 mL | Freq: Once | INTRAMUSCULAR | Status: DC
  Filled 2018-09-18: qty 20

## 2018-09-18 MED ORDER — POLYETHYLENE GLYCOL 3350 17 G PO PACK: 17.0000 g | PACK | Freq: Every day | ORAL | Status: DC | PRN

## 2018-09-18 MED ORDER — ASPIRIN EC 81 MG PO TBEC: 81.0000 mg | DELAYED_RELEASE_TABLET | Freq: Two times a day (BID) | ORAL | 0 refills | Status: DC

## 2018-09-18 MED ORDER — CHLORHEXIDINE GLUCONATE 4 % EX LIQD: 60.0000 mL | Freq: Once | CUTANEOUS | Status: DC

## 2018-09-18 MED ORDER — PROPOFOL 500 MG/50ML IV EMUL
INTRAVENOUS | Status: DC | PRN
  Administered 2018-09-18: 75 ug/kg/min via INTRAVENOUS

## 2018-09-18 MED ORDER — TRIAMTERENE-HCTZ 75-50 MG PO TABS
1.0000 | ORAL_TABLET | Freq: Every day | ORAL | Status: DC
  Administered 2018-09-18 – 2018-09-19 (×2): 1 via ORAL
  Filled 2018-09-18 (×2): qty 1

## 2018-09-18 MED ORDER — ONDANSETRON HCL 4 MG/2ML IJ SOLN
INTRAMUSCULAR | Status: DC | PRN
  Administered 2018-09-18: 4 mg via INTRAVENOUS

## 2018-09-18 MED ORDER — PROPOFOL 10 MG/ML IV BOLUS
INTRAVENOUS | Status: DC | PRN
  Administered 2018-09-18: 30 mg via INTRAVENOUS
  Administered 2018-09-18: 20 mg via INTRAVENOUS

## 2018-09-18 MED ORDER — ALUM & MAG HYDROXIDE-SIMETH 200-200-20 MG/5ML PO SUSP: 30.0000 mL | ORAL | Status: DC | PRN

## 2018-09-18 MED ORDER — SODIUM CHLORIDE 0.9 % IV SOLN
INTRAVENOUS | Status: DC | PRN
  Administered 2018-09-18: 40 ug/min via INTRAVENOUS

## 2018-09-18 MED ORDER — HYDROMORPHONE HCL 1 MG/ML IJ SOLN: 0.5000 mg | INTRAMUSCULAR | Status: DC | PRN

## 2018-09-18 MED ORDER — LACTATED RINGERS IV SOLN
INTRAVENOUS | Status: DC
  Administered 2018-09-18 (×2): via INTRAVENOUS

## 2018-09-18 MED ORDER — TIZANIDINE HCL 2 MG PO TABS: 2.0000 mg | ORAL_TABLET | Freq: Four times a day (QID) | ORAL | 0 refills | Status: DC | PRN

## 2018-09-18 MED ORDER — DIPHENHYDRAMINE HCL 12.5 MG/5ML PO ELIX: 12.5000 mg | ORAL_SOLUTION | ORAL | Status: DC | PRN

## 2018-09-18 MED ORDER — GEMFIBROZIL 600 MG PO TABS
600.0000 mg | ORAL_TABLET | Freq: Two times a day (BID) | ORAL | Status: DC
  Administered 2018-09-18 – 2018-09-19 (×2): 600 mg via ORAL
  Filled 2018-09-18 (×3): qty 1

## 2018-09-18 MED ORDER — ONDANSETRON HCL 4 MG PO TABS: 4.0000 mg | ORAL_TABLET | Freq: Four times a day (QID) | ORAL | Status: DC | PRN

## 2018-09-18 MED ORDER — METHOCARBAMOL 500 MG PO TABS
500.0000 mg | ORAL_TABLET | Freq: Four times a day (QID) | ORAL | Status: DC | PRN
  Administered 2018-09-18 – 2018-09-19 (×2): 500 mg via ORAL
  Filled 2018-09-18 (×2): qty 1

## 2018-09-18 MED ORDER — PHENYLEPHRINE HCL 10 MG/ML IJ SOLN
INTRAMUSCULAR | Status: AC
  Filled 2018-09-18: qty 1

## 2018-09-18 MED ORDER — FLEET ENEMA 7-19 GM/118ML RE ENEM: 1.0000 | ENEMA | Freq: Once | RECTAL | Status: DC | PRN

## 2018-09-18 SURGICAL SUPPLY — 42 items
BAG DECANTER FOR FLEXI CONT (MISCELLANEOUS) ×2 IMPLANT
BLADE SAW SGTL 18X1.27X75 (BLADE) ×2 IMPLANT
COVER PERINEAL POST (MISCELLANEOUS) ×2 IMPLANT
COVER SURGICAL LIGHT HANDLE (MISCELLANEOUS) ×2 IMPLANT
COVER WAND RF STERILE (DRAPES) IMPLANT
CUP ACETBLR 48 OD 100 SERIES (Hips) ×2 IMPLANT
DECANTER SPIKE VIAL GLASS SM (MISCELLANEOUS) ×4 IMPLANT
DRAPE STERI IOBAN 125X83 (DRAPES) ×2 IMPLANT
DRAPE U-SHAPE 47X51 STRL (DRAPES) ×4 IMPLANT
DRSG AQUACEL AG ADV 3.5X10 (GAUZE/BANDAGES/DRESSINGS) ×2 IMPLANT
DURAPREP 26ML APPLICATOR (WOUND CARE) ×2 IMPLANT
ELECT REM PT RETURN 15FT ADLT (MISCELLANEOUS) ×2 IMPLANT
ELIMINATOR HOLE APEX DEPUY (Hips) ×2 IMPLANT
GLOVE BIO SURGEON STRL SZ7.5 (GLOVE) ×2 IMPLANT
GLOVE BIO SURGEON STRL SZ8.5 (GLOVE) ×2 IMPLANT
GLOVE BIOGEL PI IND STRL 8 (GLOVE) ×1 IMPLANT
GLOVE BIOGEL PI IND STRL 9 (GLOVE) ×1 IMPLANT
GLOVE BIOGEL PI INDICATOR 8 (GLOVE) ×1
GLOVE BIOGEL PI INDICATOR 9 (GLOVE) ×1
GOWN STRL REUS W/ TWL LRG LVL3 (GOWN DISPOSABLE) ×1 IMPLANT
GOWN STRL REUS W/ TWL XL LVL3 (GOWN DISPOSABLE) ×1 IMPLANT
GOWN STRL REUS W/TWL LRG LVL3 (GOWN DISPOSABLE) ×1
GOWN STRL REUS W/TWL XL LVL3 (GOWN DISPOSABLE) ×1
HEAD FEMORAL 32 CERAMIC (Hips) ×2 IMPLANT
MANIFOLD NEPTUNE II (INSTRUMENTS) ×2 IMPLANT
NEEDLE HYPO 21X1.5 SAFETY (NEEDLE) ×4 IMPLANT
NS IRRIG 1000ML POUR BTL (IV SOLUTION) ×2 IMPLANT
PACK ANTERIOR HIP CUSTOM (KITS) ×2 IMPLANT
PINN ALTRX NEUT ID X OD 32X48 ×2 IMPLANT
STEM FEMORAL SZ 5MM STD ACTIS (Stem) ×2 IMPLANT
SUT ETHIBOND NAB CT1 #1 30IN (SUTURE) ×2 IMPLANT
SUT VIC AB 0 CT1 27 (SUTURE) ×1
SUT VIC AB 0 CT1 27XBRD ANBCTR (SUTURE) ×1 IMPLANT
SUT VIC AB 1 CTX 36 (SUTURE) ×1
SUT VIC AB 1 CTX36XBRD ANBCTR (SUTURE) ×1 IMPLANT
SUT VIC AB 2-0 CT1 27 (SUTURE) ×1
SUT VIC AB 2-0 CT1 TAPERPNT 27 (SUTURE) ×1 IMPLANT
SUT VIC AB 3-0 CT1 27 (SUTURE) ×1
SUT VIC AB 3-0 CT1 TAPERPNT 27 (SUTURE) ×1 IMPLANT
SYR CONTROL 10ML LL (SYRINGE) ×6 IMPLANT
TRAY FOLEY MTR SLVR 16FR STAT (SET/KITS/TRAYS/PACK) IMPLANT
YANKAUER SUCT BULB TIP 10FT TU (MISCELLANEOUS) ×2 IMPLANT

## 2018-09-18 NOTE — Anesthesia Preprocedure Evaluation (Addendum)
Anesthesia Evaluation  Patient identified by MRN, date of birth, ID band Patient awake    Reviewed: Allergy & Precautions, NPO status , Patient's Chart, lab work & pertinent test results  History of Anesthesia Complications (+) PONV and history of anesthetic complications  Airway Mallampati: II  TM Distance: >3 FB Neck ROM: Full    Dental no notable dental hx. (+) Caps, Dental Advisory Given   Pulmonary former smoker,    Pulmonary exam normal        Cardiovascular hypertension, Pt. on medications negative cardio ROS Normal cardiovascular exam     Neuro/Psych negative neurological ROS  negative psych ROS   GI/Hepatic Neg liver ROS, GERD  ,  Endo/Other  Morbid obesity  Renal/GU negative Renal ROS     Musculoskeletal  (+) Arthritis ,   Abdominal   Peds  Hematology  (+) JEHOVAH'S WITNESS  Anesthesia Other Findings Day of surgery medications reviewed with the patient.  Reproductive/Obstetrics                            Anesthesia Physical Anesthesia Plan  ASA: III  Anesthesia Plan: Spinal   Post-op Pain Management:    Induction:   PONV Risk Score and Plan: 3 and Ondansetron, Dexamethasone and Propofol infusion  Airway Management Planned: Natural Airway and Simple Face Mask  Additional Equipment:   Intra-op Plan:   Post-operative Plan:   Informed Consent: I have reviewed the patients History and Physical, chart, labs and discussed the procedure including the risks, benefits and alternatives for the proposed anesthesia with the patient or authorized representative who has indicated his/her understanding and acceptance.   Dental advisory given  Plan Discussed with: CRNA and Anesthesiologist  Anesthesia Plan Comments:        Anesthesia Quick Evaluation

## 2018-09-18 NOTE — Anesthesia Procedure Notes (Signed)
Spinal  Patient location during procedure: OR Start time: 09/18/2018 12:02 PM End time: 09/18/2018 12:12 PM Staffing Anesthesiologist: Duane Boston, MD Performed: anesthesiologist  Preanesthetic Checklist Completed: patient identified, surgical consent, pre-op evaluation, timeout performed, IV checked, risks and benefits discussed and monitors and equipment checked Spinal Block Patient position: sitting Prep: DuraPrep Patient monitoring: cardiac monitor, continuous pulse ox and blood pressure Approach: midline Location: L2-3 Injection technique: single-shot Needle Needle type: Pencan  Needle gauge: 24 G Needle length: 9 cm Additional Notes Functioning IV was confirmed and monitors were applied. Sterile prep and drape, including hand hygiene and sterile gloves were used. The patient was positioned and the spine was prepped. The skin was anesthetized with lidocaine.  Free flow of clear CSF was obtained prior to injecting local anesthetic into the CSF.  The spinal needle aspirated freely following injection.  The needle was carefully withdrawn.  The patient tolerated the procedure well.

## 2018-09-18 NOTE — Interval H&P Note (Signed)
History and Physical Interval Note:  09/18/2018 11:02 AM  Kathleen Wiley  has presented today for surgery, with the diagnosis of RIGHT HIP ARTHROPLASTY  The various methods of treatment have been discussed with the patient and family. After consideration of risks, benefits and other options for treatment, the patient has consented to  Procedure(s): TOTAL HIP ARTHROPLASTY ANTERIOR APPROACH (Right) as a surgical intervention .  The patient's history has been reviewed, patient examined, no change in status, stable for surgery.  I have reviewed the patient's chart and labs.  Questions were answered to the patient's satisfaction.     Kerin Salen

## 2018-09-18 NOTE — Evaluation (Signed)
Physical Therapy Evaluation Patient Details Name: Kathleen Wiley MRN: [id number] DOB: 02-Jul-1946 Today's Date: 09/18/2018   History of Present Illness  72 yo female s/p R DA-THA on 09/18/18. PMH includes cellulitis, GERD, HTN, OA, afib, endometriosis, L THA 2013, R TKR 2014.   Clinical Impression   Pt presents with mild R hip pain with mobility, increased time and effort to perform mobility tasks, and decreased tolerance for ambulation. Pt to benefit from acute PT to address deficits. Pt ambulated 40 ft with RW with min guard assist, and tolerated ambulation well. Pt reports hip feels much better post-surgery than prior to surgery. Pt educated on ankle pumps (20/hour) to increase circulation. PT to progress mobility as tolerated, and will continue to follow acutely.      Follow Up Recommendations Follow surgeon's recommendation for DC plan and follow-up therapies;Supervision for mobility/OOB(HHPT)    Equipment Recommendations  Rolling walker with 5" wheels    Recommendations for Other Services       Precautions / Restrictions Precautions Precautions: Fall Restrictions Weight Bearing Restrictions: No RLE Weight Bearing: Weight bearing as tolerated      Mobility  Bed Mobility Overal bed mobility: Needs Assistance Bed Mobility: Supine to Sit     Supine to sit: Min guard;HOB elevated     General bed mobility comments: Min guard for safety. VC for sequencing, increased time/effort   Transfers Overall transfer level: Needs assistance Equipment used: Rolling walker (2 wheeled) Transfers: Sit to/from Stand Sit to Stand: Min guard;From elevated surface         General transfer comment: Min guard for safety. VC for hand placement.   Ambulation/Gait Ambulation/Gait assistance: Min guard Gait Distance (Feet): 40 Feet Assistive device: Rolling walker (2 wheeled) Gait Pattern/deviations: Step-to pattern;Decreased stride length Gait velocity: decr    General Gait Details:  Min guard for safety. Verbal cuing for sequencing, turning. slowed gait.   Stairs            Wheelchair Mobility    Modified Rankin (Stroke Patients Only)       Balance Overall balance assessment: Mild deficits observed, not formally tested                                           Pertinent Vitals/Pain Pain Assessment: 0-10 Pain Score: 1  Pain Location: R hip  Pain Descriptors / Indicators: Aching Pain Intervention(s): Limited activity within patient's tolerance;Monitored during session;Ice applied;Repositioned    Home Living Family/patient expects to be discharged to:: Private residence Living Arrangements: Spouse/significant other Available Help at Discharge: Available PRN/intermittently;Family Type of Home: House Home Access: Stairs to enter Entrance Stairs-Rails: Psychiatric nurse of Steps: 2 Home Layout: One level Home Equipment: Grab bars - tub/shower;Cane - single point;Grab bars - toilet      Prior Function Level of Independence: Independent with assistive device(s)         Comments: used cane for ambulation prior to admission      Hand Dominance   Dominant Hand: Right    Extremity/Trunk Assessment   Upper Extremity Assessment Upper Extremity Assessment: Overall WFL for tasks assessed    Lower Extremity Assessment Lower Extremity Assessment: Overall WFL for tasks assessed;RLE deficits/detail RLE Deficits / Details: suspected post-surgical hip weakness; able to perform ankle pumps, quad sets, heel slides with assist RLE Sensation: WNL    Cervical / Trunk Assessment Cervical / Trunk Assessment: Normal  Communication   Communication: No difficulties  Cognition Arousal/Alertness: Awake/alert Behavior During Therapy: WFL for tasks assessed/performed Overall Cognitive Status: Within Functional Limits for tasks assessed                                        General Comments       Exercises     Assessment/Plan    PT Assessment Patient needs continued PT services  PT Problem List Decreased strength;Pain;Decreased range of motion;Decreased activity tolerance;Decreased knowledge of use of DME;Decreased balance;Decreased mobility       PT Treatment Interventions DME instruction;Therapeutic activities;Gait training;Therapeutic exercise;Patient/family education;Stair training;Balance training;Functional mobility training    PT Goals (Current goals can be found in the Care Plan section)  Acute Rehab PT Goals Patient Stated Goal: none stated  PT Goal Formulation: With patient Time For Goal Achievement: 09/25/18 Potential to Achieve Goals: Good    Frequency 7X/week   Barriers to discharge        Co-evaluation               AM-PAC PT "6 Clicks" Daily Activity  Outcome Measure Difficulty turning over in bed (including adjusting bedclothes, sheets and blankets)?: Unable Difficulty moving from lying on back to sitting on the side of the bed? : Unable Difficulty sitting down on and standing up from a chair with arms (e.g., wheelchair, bedside commode, etc,.)?: Unable Help needed moving to and from a bed to chair (including a wheelchair)?: A Little Help needed walking in hospital room?: A Little Help needed climbing 3-5 steps with a railing? : A Little 6 Click Score: 12    End of Session Equipment Utilized During Treatment: Gait belt Activity Tolerance: Patient tolerated treatment well Patient left: in chair;with chair alarm set;with call bell/phone within reach;with family/visitor present Nurse Communication: Mobility status PT Visit Diagnosis: Other abnormalities of gait and mobility (R26.89);Difficulty in walking, not elsewhere classified (R26.2)    Time: 1845-1915 PT Time Calculation (min) (ACUTE ONLY): 30 min   Charges:   PT Evaluation $PT Eval Low Complexity: 1 Low PT Treatments $Gait Training: 8-22 mins        Kathleen Wiley, PT Acute  Rehabilitation Services Pager [phone]  Office [phone]  Kathleen Wiley 09/18/2018, 7:58 PM

## 2018-09-18 NOTE — Op Note (Signed)
OPERATIVE REPORT    DATE OF PROCEDURE:  09/18/2018       PREOPERATIVE DIAGNOSIS:  RIGHT HIP ARTHROPLASTY                                                          POSTOPERATIVE DIAGNOSIS:  RIGHT HIP ARTHROPLASTY                                                           PROCEDURE: Anterior R total hip arthroplasty using a 48 mm DePuy Pinnacle  Cup, [employer], 0-degree polyethylene liner, a +1 mm x 3mm ceramic head, a 5 std Depuy Actis stem   SURGEON: Kerin Salen    ASSISTANT:   Kerry Hough. [employer]  (present throughout entire procedure and necessary for timely completion of the procedure)   ANESTHESIA: Spinal BLOOD LOSS: 700 cc FLUID REPLACEMENT: 1500 cc crystalloid Antibiotic: 2gm ancef Tranexamic Acid: 1gm IV, 2gm Topical Exparel: 266mg  COMPLICATIONS: none    INDICATIONS FOR PROCEDURE: A 72 y.o. year-old With  RIGHT HIP ARTHROPLASTY   for 4 years, x-rays show bone-on-bone arthritic changes, and osteophytes. Despite conservative measures with observation, anti-inflammatory medicine, narcotics, use of a cane, has severe unremitting pain and can ambulate only a few blocks before resting. Patient desires elective R total hip arthroplasty to decrease pain and increase function. The risks, benefits, and alternatives were discussed at length including but not limited to the risks of infection, bleeding, nerve injury, stiffness, blood clots, the need for revision surgery, cardiopulmonary complications, among others, and they were willing to proceed. Questions answered      PROCEDURE IN DETAIL: The patient was identified by armband,   received preoperative IV antibiotics in the holding area at [facility], taken to the operating room , appropriate anesthetic monitors   were attached and  anesthesia was induced with the patient on the gurney. The HANA boots were applied to the feet and the patient  was transferred to the HANA table with a peroneal post and support  underneath the non-operative leg, which was locked in 2 lb traction. Theoperative lower extremity was then prepped and draped in the usual sterile fashion from just above the iliac crest to the knee. And a timeout procedure was performed. We then made a 14 cm incision along the interval at the leading edge of the tensor fascia lata of starting at 2 cm lateral to the ASIS. Small bleeders in the skin and subcutaneous tissue identified and cauterized we dissected down to the fascia and made an incision in the fascia allowing [place] to elevate the fascia of the tensor muscle and exploited the interval between the rectus and the tensor fascia lata. A Cobra retractor was then placed along the superior neck of the femur. A cerebellar retractor was used to expose the interval between the tensor fascia lata and the rectus femoris. .  We identified and cauterized the ascending branch of the anterior circumflex artery. A second Cobra retractor along the inferior neck of the femur. A small Hohmann retractor was placed underneath the origin of the rectus femoris, giving [place]  good medial exposure. Using Ronguers fatty tissue was removed from in front of the anterior capsule. The capsule was then incised, starting out at the superior anterior aspect of the acetabulum going laterally along the anterior neck. The capsule was then teed along the neck superiorly and inferiorly. Electrocautery was used to release capsule from the anterior and medial neck of the femur to allow external rotation. Cobra retractors were then placed along the inferior and superior neck allowing [place] to perform a standard neck cut and removed the femoral head with a power corkscrew. We then placed a medium bent homan retractor in the cotyloid notch and posteriorly along the acetabular rim a narrow Cobra retractor. Exposed labral tissue was then removed with the electrocautery. We then sequentially reamed up to a 47 mm basket reamer obtaining good coverage in all  quadrants, verified by C-arm imaging. Under C-arm control we then hammered into place a 48 mm Pinnacle cup in 45 of abduction and 15 of anteversion. The cup seated nicely and required no supplemental screws. We then placed a central hole Eliminator and a 0 polyethylene liner. The foot was then externally rotated to 130-140. The limb was extended and adducted delivering the proximal femur up into the wound. A medium curved Hohmann retractor was placed over the greater trochanter and a long Homan retractor along the posterior femoral neck completing the exposure. We then performed releases superiorly and and inferiorly of the capsule going back to the pirformis fossa superiorly and to the lesser trochanter inferiorly. We then entered the proximal femur with the box cutting offset chisel followed by, a canal sounder, the chili pepper and broaching up to a 5 broach. This seated nicely and we reamed the calcar. A trial reduction was performed with a 1.5 mm 32 mm head.The limb lengths were excellent the hip was stable in 90 of external rotation. At this point the trial components removed and we hammered into place a # 5 std  Offset Actis stem with Gryption coating. A +1 mm x 32 head was then hammered into place. The hip was reduced and final C-arm images obtained. The wound was thoroughly irrigated with normal saline solution. We repaired the ant capsule and the tensor fascia lot a with running 0 vicryl suture. the subcutaneous tissue was closed with 2-0 and 3-0 Vicryl suture followed by an Aquacil dressing. At this point the patient was awaken and transferred to hospital gurney without difficulty.   Kerin Salen 09/18/2018, 1:35 PM

## 2018-09-18 NOTE — Anesthesia Postprocedure Evaluation (Signed)
Anesthesia Post Note  Patient: Kathleen Wiley  Procedure(s) Performed: TOTAL HIP ARTHROPLASTY ANTERIOR APPROACH (Right Hip)     Patient location during evaluation: PACU Anesthesia Type: Spinal Level of consciousness: awake and alert Pain management: pain level controlled Vital Signs Assessment: post-procedure vital signs reviewed and stable Respiratory status: spontaneous breathing and respiratory function stable Cardiovascular status: blood pressure returned to baseline and stable Postop Assessment: spinal receding and no apparent nausea or vomiting Anesthetic complications: no    Last Vitals:  Vitals:   09/18/18 1430 09/18/18 1445  BP: (!) 90/52 (!) 97/53  Pulse: 61 (!) 59  Resp: 15 12  Temp:    SpO2: 97% 97%    Last Pain:  Vitals:   09/18/18 1445  TempSrc:   PainSc: 0-No pain      LLE Sensation: No sensation (absent) (09/18/18 1445)   RLE Sensation: No sensation (absent) (09/18/18 1445) L Sensory Level: L2-Upper inner thigh, upper buttock (09/18/18 1445) R Sensory Level: L2-Upper inner thigh, upper buttock (09/18/18 1445)  Audry Pili

## 2018-09-18 NOTE — Discharge Instructions (Signed)

## 2018-09-18 NOTE — Transfer of Care (Signed)
Immediate Anesthesia Transfer of Care Note  Patient: Kathleen Wiley  Procedure(s) Performed: Procedure(s): TOTAL HIP ARTHROPLASTY ANTERIOR APPROACH (Right)  Patient Location: PACU  Anesthesia Type:Spinal  Level of Consciousness:  sedated, patient cooperative and responds to stimulation  Airway & Oxygen Therapy:Patient Spontanous Breathing and Patient connected to face mask oxgen  Post-op Assessment:  Report given to PACU RN and Post -op Vital signs reviewed and stable  Post vital signs:  Reviewed and stable  Last Vitals:  Vitals:   09/18/18 1014  BP: 131/71  Pulse: 68  Resp: 16  Temp: 37.1 C  SpO2: 48%    Complications: No apparent anesthesia complications

## 2018-09-18 NOTE — Progress Notes (Signed)
[facility]  Patient Status: New  [facility] is providing the following services: PT  If patient discharges after hours, please call [phone].   Kathleen Wiley 09/18/2018, 12:44 PM

## 2018-09-19 ENCOUNTER — Encounter (HOSPITAL_COMMUNITY): Payer: Self-pay | Admitting: Orthopedic Surgery

## 2018-09-19 LAB — BASIC METABOLIC PANEL
Anion gap: 9 (ref 5–15)
BUN: 16 mg/dL (ref 8–23)
CHLORIDE: 106 mmol/L (ref 98–111)
CO2: 23 mmol/L (ref 22–32)
CREATININE: 0.75 mg/dL (ref 0.44–1.00)
Calcium: 8.5 mg/dL — ABNORMAL LOW (ref 8.9–10.3)
GFR calc non Af Amer: >60 mL/min (ref >60)
Glucose, Bld: 211 mg/dL — ABNORMAL HIGH (ref 70–99)
POTASSIUM: 3.5 mmol/L (ref 3.5–5.1)
SODIUM: 138 mmol/L (ref 135–145)

## 2018-09-19 LAB — CBC
HCT: 28.8 % — ABNORMAL LOW (ref 36.0–46.0)
HEMOGLOBIN: 9.4 g/dL — AB (ref 12.0–15.0)
MCH: 31.9 pg (ref 26.0–34.0)
MCHC: 32.6 g/dL (ref 30.0–36.0)
MCV: 97.6 fL (ref 80.0–100.0)
Platelets: 171 10*3/uL (ref 150–400)
RBC: 2.95 MIL/uL — AB (ref 3.87–5.11)
RDW: 13.9 % (ref 11.5–15.5)
WBC: 5.1 10*3/uL (ref 4.0–10.5)
nRBC: 0 % (ref 0.0–0.2)

## 2018-09-19 NOTE — Discharge Summary (Signed)
Patient ID: Kathleen Wiley MRN: [id number] DOB/AGE: May 11, 1946 72 y.o.  Admit date: 09/18/2018 Discharge date: 09/19/2018  Admission Diagnoses:  Principal Problem:   Osteoarthritis of right hip Active Problems:   History of total hip arthroplasty, right   Discharge Diagnoses:  Same  Past Medical History:  Diagnosis Date  . Arthritis    knee, hip  . Cellulitis 08/2016   left leg  . Complication of anesthesia   . Dysrhythmia    Afib due to Flagyl- 7 -2007- not problems since  . GERD (gastroesophageal reflux disease)   . Heart murmur    "slight "  informed years ago.-   . Hernia, umbilical   . History of atrial fibrillation 2007   with flagyl  . History of endometriosis   . Hypercholesteremia   . Hypertension    has not been to a cardiologist  . PONV (postoperative nausea and vomiting)   . Refusal of blood transfusions as patient is Jehovah's Witness     Surgeries: Procedure(s): TOTAL HIP ARTHROPLASTY ANTERIOR APPROACH on 09/18/2018   Consultants:   Discharged Condition: Improved  Hospital Course: Kathleen Wiley is an 72 y.o. female who was admitted 09/18/2018 for operative treatment ofOsteoarthritis of right hip. Patient has severe unremitting pain that affects sleep, daily activities, and work/hobbies. After pre-op clearance the patient was taken to the operating room on 09/18/2018 and underwent  Procedure(s): TOTAL HIP ARTHROPLASTY ANTERIOR APPROACH.    Patient was given perioperative antibiotics:  Anti-infectives (From admission, onward)   Start     Dose/Rate Route Frequency Ordered Stop   09/18/18 1000  ceFAZolin (ANCEF) IVPB 2g/100 mL premix     2 g 200 mL/hr over 30 Minutes Intravenous On call to O.R. 09/18/18 0957 09/18/18 1212       Patient was given sequential compression devices, early ambulation, and chemoprophylaxis to prevent DVT.  Patient benefited maximally from hospital stay and there were no complications.    Recent vital signs:  Patient  Vitals for the past 24 hrs:  BP Temp Temp src Pulse Resp SpO2 Height Weight  09/19/18 0609 109/62 98.5 F (36.9 C) Oral 62 17 99 % no documentation no documentation  09/19/18 0212 111/65 97.8 F (36.6 C) Oral (Abnormal) 58 no documentation 97 % no documentation no documentation  09/18/18 2219 108/64 98.1 F (36.7 C) Oral 60 no documentation 99 % no documentation no documentation  09/18/18 1929 (Abnormal) 112/59 97.8 F (36.6 C) Oral 76 no documentation 94 % no documentation no documentation  09/18/18 1818 125/67 97.6 F (36.4 C) Oral 70 16 100 % no documentation no documentation  09/18/18 1717 120/71 97.7 F (36.5 C) Oral 66 16 100 % no documentation no documentation  09/18/18 1616 (Abnormal) 110/59 97.6 F (36.4 C) Oral 64 15 100 % no documentation no documentation  09/18/18 1545 (Abnormal) 111/52 97.6 F (36.4 C) no documentation no documentation no documentation no documentation no documentation no documentation  09/18/18 1500 no documentation no documentation no documentation 63 no documentation no documentation no documentation no documentation  09/18/18 1445 (Abnormal) 97/53 no documentation no documentation (Abnormal) 59 12 97 % no documentation no documentation  09/18/18 1430 (Abnormal) 90/52 no documentation no documentation 61 15 97 % no documentation no documentation  09/18/18 1415 (Abnormal) 117/59 97.9 F (36.6 C) no documentation (Abnormal) 55 17 99 % no documentation no documentation  09/18/18 1412 (Abnormal) 95/57 no documentation no documentation (Abnormal) 55 15 100 % no documentation no documentation  09/18/18 1014 131/71 98.7 F (  37.1 C) Oral 68 16 94 % no documentation no documentation  09/18/18 1011 no documentation no documentation no documentation no documentation no documentation no documentation 5' 4.5" (1.638 m) 103.4 kg     Recent laboratory studies:  Recent Labs    09/19/18 0418  WBC 5.1  HGB 9.4*  HCT 28.8*  PLT 171  NA 138  K 3.5  CL 106  CO2 23   BUN 16  CREATININE 0.75  GLUCOSE 211*  CALCIUM 8.5*     Discharge Medications:   Allergies as of 09/19/2018    Allergen Reactions Comment   Other  Refuses whole blood but does accept albumin   Metronidazole Other (See Comments) Chest pain, A-fib   Septra [sulfamethoxazole-trimethoprim] Other (See Comments) Blisters      Medication List    Stop taking these medications   ibuprofen 200 MG tablet Commonly known as:  ADVIL,MOTRIN   traMADol 50 MG tablet Commonly known as:  ULTRAM     Take these medications   aspirin EC 81 MG tablet Take 1 tablet (81 mg total) by mouth 2 (two) times daily.   cholecalciferol 1000 units tablet Commonly known as:  VITAMIN D Take 1,000 Units by mouth daily.   cyanocobalamin 1000 MCG tablet Take 1,000 mcg by mouth daily.   diphenhydrAMINE 25 MG tablet Commonly known as:  BENADRYL Take 25 mg by mouth at bedtime.   gemfibrozil 600 MG tablet Commonly known as:  LOPID Take 600 mg by mouth 2 (two) times daily before a meal.   omeprazole 20 MG capsule Commonly known as:  PRILOSEC Take 20 mg by mouth daily.   oxyCODONE-acetaminophen 5-325 MG tablet Commonly known as:  PERCOCET/ROXICET Take 1 tablet by mouth every 4 (four) hours as needed for severe pain. What changed:    how much to take  when to take this  reasons to take this   Potassium 99 MG Tabs Take 99 mg by mouth daily.   senna 8.6 MG Tabs tablet Commonly known as:  SENOKOT Take 1 tablet (8.6 mg total) by mouth daily as needed for mild constipation.   tiZANidine 2 MG tablet Commonly known as:  ZANAFLEX Take 1 tablet (2 mg total) by mouth every 6 (six) hours as needed.   triamterene-hydrochlorothiazide 75-50 MG tablet Commonly known as:  MAXZIDE Take 1 tablet by mouth daily.        Durable Medical Equipment  (From admission, onward)         Start     Ordered   09/18/18 1620  DME Walker rolling  Once    Question:  Patient needs a walker to treat with the  following condition  Answer:  Status post right hip replacement   09/18/18 1619   09/18/18 1620  DME 3 n 1  Once     09/18/18 1619           Discharge Care Instructions  (From admission, onward)         Start     Ordered   09/19/18 0000  Change dressing    Comments:  You may change your dressing , if drainage on a window exceeds 40%   09/19/18 0737          Diagnostic Studies: Dg C-arm 1-60 Min-no Report  Result Date: 09/18/2018 Fluoroscopy was utilized by the requesting physician.  No radiographic interpretation.   Dg Hip Operative Unilat W Or W/o Pelvis Right  Result Date: 09/18/2018 CLINICAL DATA:  Right anterior hip replacement.  EXAM: OPERATIVE right HIP (WITH PELVIS IF PERFORMED) 3 VIEWS TECHNIQUE: Fluoroscopic spot image(s) were submitted for interpretation post-operatively. Radiation exposure index: 4.2358 mGy. COMPARISON:  None. FINDINGS: The femoral and acetabular components appear to be well situated. Expected postoperative changes are noted in the surrounding soft tissues. No fracture or dislocation is noted. IMPRESSION: Status post right total hip arthroplasty. Electronically Signed   By: Marijo Conception, M.D.   On: 09/18/2018 13:54    Disposition: Discharge disposition: 01-Home or Self Care       Discharge Instructions    Call MD / Call 911   Complete by:  As directed    If you experience chest pain or shortness of breath, CALL 911 and be transported to the hospital emergency room.  If you develope a fever above 101 F, pus (white drainage) or increased drainage or redness at the wound, or calf pain, call your surgeon's office.   Change dressing   Complete by:  As directed    You may change your dressing , if drainage on a window exceeds 40%   Constipation Prevention   Complete by:  As directed    Drink plenty of fluids.  Prune juice may be helpful.  You may use a stool softener, such as Colace (over the counter) 100 mg twice a day.  Use MiraLax (over the  counter) for constipation as needed.   Diet - low sodium heart healthy   Complete by:  As directed    Increase activity slowly as tolerated   Complete by:  As directed       Follow-up Information    Frederik Pear, MD In 2 weeks.   Specialty:  Orthopedic Surgery Contact information: [place] [id number] [phone]            Signed: Kerin Salen 09/19/2018, 7:38 AM

## 2018-09-19 NOTE — Progress Notes (Signed)
PATIENT ID: Kathleen Wiley  MRN: [id number]  DOB/AGE:  71-Dec-1947 / 72 y.o.  1 Day Post-Op Procedure(s) (LRB): TOTAL HIP ARTHROPLASTY ANTERIOR APPROACH (Right)    PROGRESS NOTE Subjective: Patient is alert, oriented, No Nausea, No Vomiting, yes passing gas, . Taking PO Well. Denies SOB, Chest or Calf Pain. Using Incentive Spirometer, PAS in place. Ambulate: 60 feet yesterday with PT Patient reports pain as  1/10  .    Objective: Vital signs in last 24 hours: Vitals:   09/18/18 1929 09/18/18 2219 09/19/18 0212 09/19/18 0609  BP: (Abnormal) 112/59 108/64 111/65 109/62  Pulse: 76 60 (Abnormal) 58 62  Resp:    17  Temp: 97.8 F (36.6 C) 98.1 F (36.7 C) 97.8 F (36.6 C) 98.5 F (36.9 C)  TempSrc: Oral Oral Oral Oral  SpO2: 94% 99% 97% 99%  Weight:      Height:          Intake/Output from previous day: I/O last 3 completed shifts: In: 7478.8 [P.O.:880; I.V.:5777.1; IV Piggyback:821.7] Out: 3000 [Urine:2300; Blood:700]   Intake/Output this shift: No intake/output data recorded.   LABORATORY DATA: Recent Labs    09/18/18 1013 09/19/18 0418  WBC  --  5.1  HGB  --  9.4*  HCT  --  28.8*  PLT  --  171  NA  --  138  K  --  3.5  CL  --  106  CO2  --  23  BUN  --  16  CREATININE  --  0.75  GLUCOSE  --  211*  GLUCAP 115*  --   CALCIUM  --  8.5*    Examination: Neurologically intact ABD soft Neurovascular intact Sensation intact distally Intact pulses distally Dorsiflexion/Plantar flexion intact Incision: dressing C/D/I No cellulitis present Compartment soft} XR AP&Lat of hip shows well placed\fixed THA  Assessment:   1 Day Post-Op Procedure(s) (LRB): TOTAL HIP ARTHROPLASTY ANTERIOR APPROACH (Right) ADDITIONAL DIAGNOSIS:  Expected Acute Blood Loss Anemia, Hypertension  Plan: PT/OT WBAT, THA  DVT Prophylaxis: SCDx72 hrs, ASA 81 mg BID x 2 weeks  DISCHARGE PLAN: Home,Today when patient passes PT goals  DISCHARGE NEEDS: HHPT, Walker and 3-in-1 comode  seatPatient ID: Kathleen Wiley, female   DOB: Jul 31, 1946, 72 y.o.   MRN: [id number]

## 2018-09-19 NOTE — Progress Notes (Signed)
Physical Therapy Treatment Patient Details Name: Kathleen Wiley MRN: [id number] DOB: 1946-10-13 Today's Date: 09/19/2018    History of Present Illness 72 yo female s/p R DA-THA on 09/18/18. PMH includes cellulitis, GERD, HTN, OA, afib, endometriosis, L THA 2013, R TKR 2014.     PT Comments    POD # 1 pm session Assisted with amb in hallway, practiced stairs twice then completed HEP standing TE's following HEP handout.  All mobility questions addressed. Pt readu for D/C to home.   Follow Up Recommendations  Follow surgeon's recommendation for DC plan and follow-up therapies;Supervision for mobility/OOB;Home health PT     Equipment Recommendations  Rolling walker with 5" wheels    Recommendations for Other Services       Precautions / Restrictions Precautions Precautions: Fall Restrictions Weight Bearing Restrictions: No RLE Weight Bearing: Weight bearing as tolerated    Mobility  Bed Mobility Overal bed mobility: Needs Assistance Bed Mobility: Supine to Sit     Supine to sit: Min guard;HOB elevated     General bed mobility comments: OOB in recliner   Transfers Overall transfer level: Needs assistance Equipment used: Rolling walker (2 wheeled) Transfers: Sit to/from Stand Sit to Stand: Min guard;From elevated surface         General transfer comment: Min guard for safety. VC for hand placement.   Ambulation/Gait Ambulation/Gait assistance: Min guard;Supervision Gait Distance (Feet): 80 Feet Assistive device: Rolling walker (2 wheeled) Gait Pattern/deviations: Step-to pattern;Decreased stride length Gait velocity: decr    General Gait Details: Min guard for safety. Verbal cuing for sequencing, turning. slowed gait.    Stairs Stairs: Yes Stairs assistance: Supervision;Min guard Stair Management: One rail Right;Step to pattern;Sideways Number of Stairs: 2 General stair comments: 25% VC's on proper tech and sequencing performed twice    Wheelchair  Mobility    Modified Rankin (Stroke Patients Only)       Balance                                            Cognition Arousal/Alertness: Awake/alert Behavior During Therapy: WFL for tasks assessed/performed Overall Cognitive Status: Within Functional Limits for tasks assessed                                        Exercises  10 reps standing TE's     General Comments        Pertinent Vitals/Pain Pain Assessment: 0-10 Pain Score: 5  Pain Location: R hip  Pain Descriptors / Indicators: Aching;Tender;Operative site guarding;Sore Pain Intervention(s): Monitored during session;Premedicated before session;Repositioned;Ice applied    Home Living                      Prior Function            PT Goals (current goals can now be found in the care plan section) Progress towards PT goals: Progressing toward goals    Frequency    7X/week      PT Plan Current plan remains appropriate    Co-evaluation              AM-PAC PT "6 Clicks" Daily Activity  Outcome Measure  Difficulty turning over in bed (including adjusting bedclothes, sheets and blankets)?: A Lot Difficulty moving from lying on  back to sitting on the side of the bed? : A Lot Difficulty sitting down on and standing up from a chair with arms (e.g., wheelchair, bedside commode, etc,.)?: A Lot Help needed moving to and from a bed to chair (including a wheelchair)?: A Little Help needed walking in hospital room?: A Little Help needed climbing 3-5 steps with a railing? : A Lot 6 Click Score: 14    End of Session Equipment Utilized During Treatment: Gait belt Activity Tolerance: Patient tolerated treatment well Patient left: in chair;with chair alarm set;with call bell/phone within reach;with family/visitor present Nurse Communication: Mobility status PT Visit Diagnosis: Other abnormalities of gait and mobility (R26.89);Difficulty in walking, not elsewhere  classified (R26.2)     Time: 1117-3567 PT Time Calculation (min) (ACUTE ONLY): 25 min  Charges:  $Gait Training: 8-22 mins  $Therapeutic Activity: 8-22 mins                     Rica Koyanagi  PTA Acute  Rehabilitation Services Pager      [phone] Office      [phone]

## 2018-09-19 NOTE — Care Management Note (Signed)
Case Management Note  Patient Details  Name: Kathleen Wiley MRN: [id number] Date of Birth: 12/09/1945  Subjective/Objective:    Discharge planning, spoke with patient and spouse at beside. Chose [facility] for [facility] services, PT to eval and treat.            Action/Plan: Contacted [facility] for referral. Has RW and 3-n-1. [phone]       Expected Discharge Date:  09/19/18               Expected Discharge Plan:  [place]  In-House Referral:  NA  Discharge planning Services  CM Consult  Post Acute Care Choice:  Durable Medical Equipment, Home Health Choice offered to:  Patient  DME Arranged:  N/A DME Agency:  NA  HH Arranged:  PT HH Agency:  [place]  Status of Service:  Completed, signed off  If discussed at [place] of Stay Meetings, dates discussed:    Additional Comments:  Guadalupe Maple, RN 09/19/2018, 11:38 AM

## 2018-09-19 NOTE — Progress Notes (Signed)
Physical Therapy Treatment Patient Details Name: Kathleen Wiley MRN: [id number] DOB: 1945-12-01 Today's Date: 09/19/2018    History of Present Illness 72 yo female s/p R DA-THA on 09/18/18. PMH includes cellulitis, GERD, HTN, OA, afib, endometriosis, L THA 2013, R TKR 2014.     PT Comments    POD # 1 am session Assisted OOB using a belt to self assist R LE off bed with instruction on proper tech.  Assisted with amb a greater distance in hallway.  Returned to room to Performed some TE's following HEP handout.  Instructed on proper tech, freq as well as use of ICE.   Pt will need a second PT session to complete TE's and practice stairs with spouse.    Follow Up Recommendations  Follow surgeon's recommendation for DC plan and follow-up therapies;Supervision for mobility/OOB;Home health PT     Equipment Recommendations  Rolling walker with 5" wheels    Recommendations for Other Services       Precautions / Restrictions Precautions Precautions: Fall Restrictions Weight Bearing Restrictions: No RLE Weight Bearing: Weight bearing as tolerated    Mobility  Bed Mobility Overal bed mobility: Needs Assistance Bed Mobility: Supine to Sit     Supine to sit: Min guard;HOB elevated     General bed mobility comments: [email or web address] and educated how to use a belt to self assist R LE off bed   Transfers Overall transfer level: Needs assistance Equipment used: Rolling walker (2 wheeled) Transfers: Sit to/from Stand Sit to Stand: Min guard;From elevated surface         General transfer comment: Min guard for safety. VC for hand placement.   Ambulation/Gait Ambulation/Gait assistance: Min guard;Supervision Gait Distance (Feet): 55 Feet Assistive device: Rolling walker (2 wheeled) Gait Pattern/deviations: Step-to pattern;Decreased stride length Gait velocity: decr    General Gait Details: Min guard for safety. Verbal cuing for sequencing, turning. slowed gait.    Stairs             Wheelchair Mobility    Modified Rankin (Stroke Patients Only)       Balance                                            Cognition Arousal/Alertness: Awake/alert Behavior During Therapy: WFL for tasks assessed/performed Overall Cognitive Status: Within Functional Limits for tasks assessed                                        Exercises   Total Hip Replacement TE's 10 reps ankle pumps 10 reps knee presses 10 reps heel slides 10 reps SAQ's 10 reps ABD Followed by ICE     General Comments        Pertinent Vitals/Pain Pain Assessment: 0-10 Pain Score: 5  Pain Location: R hip  Pain Descriptors / Indicators: Aching;Tender;Operative site guarding;Sore Pain Intervention(s): Monitored during session;Premedicated before session;Repositioned;Ice applied    Home Living                      Prior Function            PT Goals (current goals can now be found in the care plan section) Progress towards PT goals: Progressing toward goals    Frequency    7X/week  PT Plan Current plan remains appropriate    Co-evaluation              AM-PAC PT "6 Clicks" Daily Activity  Outcome Measure  Difficulty turning over in bed (including adjusting bedclothes, sheets and blankets)?: A Lot Difficulty moving from lying on back to sitting on the side of the bed? : A Lot Difficulty sitting down on and standing up from a chair with arms (e.g., wheelchair, bedside commode, etc,.)?: A Lot Help needed moving to and from a bed to chair (including a wheelchair)?: A Little Help needed walking in hospital room?: A Little Help needed climbing 3-5 steps with a railing? : A Lot 6 Click Score: 14    End of Session Equipment Utilized During Treatment: Gait belt Activity Tolerance: Patient tolerated treatment well Patient left: in chair;with chair alarm set;with call bell/phone within reach;with family/visitor present Nurse  Communication: Mobility status PT Visit Diagnosis: Other abnormalities of gait and mobility (R26.89);Difficulty in walking, not elsewhere classified (R26.2)     Time: 1020-1045 PT Time Calculation (min) (ACUTE ONLY): 25 min  Charges:  $Gait Training: 8-22 mins $Therapeutic Exercise: 8-22 mins                     {Everhett Bozard  PTA Acute  Rehabilitation Services Pager      [phone] Office      [phone]

## 2019-01-03 ENCOUNTER — Other Ambulatory Visit: Payer: Self-pay | Admitting: Orthopaedic Surgery

## 2019-01-08 ENCOUNTER — Encounter (HOSPITAL_COMMUNITY): Payer: Self-pay

## 2019-01-09 ENCOUNTER — Ambulatory Visit (HOSPITAL_COMMUNITY): Payer: Medicare Other | Admitting: Anesthesiology

## 2019-01-09 ENCOUNTER — Encounter (HOSPITAL_COMMUNITY)
Admission: RE | Disposition: A | Discharge status: Home / Self Care | Payer: Self-pay | Source: Home / Self Care | Attending: Orthopaedic Surgery

## 2019-01-09 ENCOUNTER — Encounter (HOSPITAL_COMMUNITY): Payer: Self-pay | Admitting: *Deleted

## 2019-01-09 ENCOUNTER — Other Ambulatory Visit: Payer: Self-pay

## 2019-01-09 ENCOUNTER — Ambulatory Visit (HOSPITAL_COMMUNITY)
Admission: RE | Admit: 2019-01-09 | Discharge: 2019-01-09 | Disposition: A | Discharge status: Home / Self Care | Payer: Medicare Other | Attending: Orthopaedic Surgery | Admitting: Orthopaedic Surgery

## 2019-01-09 DIAGNOSIS — I10 Essential (primary) hypertension: Secondary | ICD-10-CM | POA: Insufficient documentation

## 2019-01-09 DIAGNOSIS — R7303 Prediabetes: Secondary | ICD-10-CM | POA: Diagnosis not present

## 2019-01-09 DIAGNOSIS — Z881 Allergy status to other antibiotic agents status: Secondary | ICD-10-CM | POA: Diagnosis not present

## 2019-01-09 DIAGNOSIS — M2011 Hallux valgus (acquired), right foot: Secondary | ICD-10-CM | POA: Diagnosis present

## 2019-01-09 DIAGNOSIS — F419 Anxiety disorder, unspecified: Secondary | ICD-10-CM | POA: Insufficient documentation

## 2019-01-09 DIAGNOSIS — K219 Gastro-esophageal reflux disease without esophagitis: Secondary | ICD-10-CM | POA: Insufficient documentation

## 2019-01-09 DIAGNOSIS — M1990 Unspecified osteoarthritis, unspecified site: Secondary | ICD-10-CM | POA: Diagnosis not present

## 2019-01-09 DIAGNOSIS — Z96643 Presence of artificial hip joint, bilateral: Secondary | ICD-10-CM | POA: Diagnosis not present

## 2019-01-09 DIAGNOSIS — Z87891 Personal history of nicotine dependence: Secondary | ICD-10-CM | POA: Insufficient documentation

## 2019-01-09 DIAGNOSIS — I4891 Unspecified atrial fibrillation: Secondary | ICD-10-CM | POA: Diagnosis not present

## 2019-01-09 DIAGNOSIS — Z79899 Other long term (current) drug therapy: Secondary | ICD-10-CM | POA: Diagnosis not present

## 2019-01-09 DIAGNOSIS — X58XXXA Exposure to other specified factors, initial encounter: Secondary | ICD-10-CM | POA: Insufficient documentation

## 2019-01-09 DIAGNOSIS — E7800 Pure hypercholesterolemia, unspecified: Secondary | ICD-10-CM | POA: Insufficient documentation

## 2019-01-09 DIAGNOSIS — M2041 Other hammer toe(s) (acquired), right foot: Secondary | ICD-10-CM | POA: Insufficient documentation

## 2019-01-09 DIAGNOSIS — Z96651 Presence of right artificial knee joint: Secondary | ICD-10-CM | POA: Diagnosis not present

## 2019-01-09 DIAGNOSIS — M21611 Bunion of right foot: Secondary | ICD-10-CM | POA: Insufficient documentation

## 2019-01-09 DIAGNOSIS — S93124A Dislocation of metatarsophalangeal joint of right lesser toe(s), initial encounter: Secondary | ICD-10-CM | POA: Diagnosis not present

## 2019-01-09 DIAGNOSIS — Z882 Allergy status to sulfonamides status: Secondary | ICD-10-CM | POA: Insufficient documentation

## 2019-01-09 HISTORY — DX: Prediabetes: R73.03

## 2019-01-09 HISTORY — PX: HAMMERTOE RECONSTRUCTION WITH WEIL OSTEOTOMY: SHX5631

## 2019-01-09 HISTORY — PX: BUNIONECTOMY: SHX129

## 2019-01-09 HISTORY — DX: Anxiety disorder, unspecified: F41.9

## 2019-01-09 HISTORY — DX: Fistula of vagina to large intestine: N82.3

## 2019-01-09 LAB — CBC
HCT: 37.7 % (ref 36.0–46.0)
Hemoglobin: 11.8 g/dL — ABNORMAL LOW (ref 12.0–15.0)
MCH: 30 pg (ref 26.0–34.0)
MCHC: 31.3 g/dL (ref 30.0–36.0)
MCV: 95.9 fL (ref 80.0–100.0)
Platelets: 163 10*3/uL (ref 150–400)
RBC: 3.93 MIL/uL (ref 3.87–5.11)
RDW: 13.6 % (ref 11.5–15.5)
WBC: 2.9 10*3/uL — AB (ref 4.0–10.5)
nRBC: 0 % (ref 0.0–0.2)

## 2019-01-09 LAB — BASIC METABOLIC PANEL
Anion gap: 8 (ref 5–15)
BUN: 25 mg/dL — ABNORMAL HIGH (ref 8–23)
CO2: 25 mmol/L (ref 22–32)
Calcium: 9.2 mg/dL (ref 8.9–10.3)
Chloride: 107 mmol/L (ref 98–111)
Creatinine, Ser: 0.94 mg/dL (ref 0.44–1.00)
GFR calc non Af Amer: >60 mL/min (ref >60)
Glucose, Bld: 140 mg/dL — ABNORMAL HIGH (ref 70–99)
POTASSIUM: 3.4 mmol/L — AB (ref 3.5–5.1)
Sodium: 140 mmol/L (ref 135–145)

## 2019-01-09 LAB — NO BLOOD PRODUCTS

## 2019-01-09 SURGERY — BUNIONECTOMY
Anesthesia: General | Site: Foot | Laterality: Right

## 2019-01-09 MED ORDER — MIDAZOLAM HCL 5 MG/5ML IJ SOLN
INTRAMUSCULAR | Status: DC | PRN
  Administered 2019-01-09: 2 mg via INTRAVENOUS

## 2019-01-09 MED ORDER — ONDANSETRON HCL 4 MG/2ML IJ SOLN
INTRAMUSCULAR | Status: DC | PRN
  Administered 2019-01-09: 4 mg via INTRAVENOUS

## 2019-01-09 MED ORDER — MIDAZOLAM HCL 2 MG/2ML IJ SOLN
INTRAMUSCULAR | Status: AC
  Filled 2019-01-09: qty 2

## 2019-01-09 MED ORDER — EPHEDRINE SULFATE-NACL 50-0.9 MG/10ML-% IV SOSY
PREFILLED_SYRINGE | INTRAVENOUS | Status: DC | PRN
  Administered 2019-01-09: 5 mg via INTRAVENOUS

## 2019-01-09 MED ORDER — METHOCARBAMOL 500 MG PO TABS: 500.0000 mg | ORAL_TABLET | Freq: Four times a day (QID) | ORAL | 0 refills | Status: AC | PRN

## 2019-01-09 MED ORDER — LACTATED RINGERS IV SOLN
INTRAVENOUS | Status: DC | PRN
  Administered 2019-01-09 (×2): via INTRAVENOUS

## 2019-01-09 MED ORDER — EPHEDRINE 5 MG/ML INJ
INTRAVENOUS | Status: AC
  Filled 2019-01-09: qty 10

## 2019-01-09 MED ORDER — PROPOFOL 1000 MG/100ML IV EMUL
INTRAVENOUS | Status: AC
  Filled 2019-01-09: qty 100

## 2019-01-09 MED ORDER — 0.9 % SODIUM CHLORIDE (POUR BTL) OPTIME
TOPICAL | Status: DC | PRN
  Administered 2019-01-09: 1000 mL

## 2019-01-09 MED ORDER — DEXAMETHASONE SODIUM PHOSPHATE 10 MG/ML IJ SOLN
INTRAMUSCULAR | Status: DC | PRN
  Administered 2019-01-09: 10 mg via INTRAVENOUS

## 2019-01-09 MED ORDER — FENTANYL CITRATE (PF) 100 MCG/2ML IJ SOLN
INTRAMUSCULAR | Status: DC | PRN
  Administered 2019-01-09: 100 ug via INTRAVENOUS

## 2019-01-09 MED ORDER — ONDANSETRON HCL 4 MG/2ML IJ SOLN
INTRAMUSCULAR | Status: AC
  Filled 2019-01-09: qty 2

## 2019-01-09 MED ORDER — CHLORHEXIDINE GLUCONATE 4 % EX LIQD: 60.0000 mL | Freq: Once | CUTANEOUS | Status: DC

## 2019-01-09 MED ORDER — PROPOFOL 10 MG/ML IV BOLUS
INTRAVENOUS | Status: DC | PRN
  Administered 2019-01-09: 150 mg via INTRAVENOUS
  Administered 2019-01-09: 50 mg via INTRAVENOUS

## 2019-01-09 MED ORDER — FENTANYL CITRATE (PF) 250 MCG/5ML IJ SOLN
INTRAMUSCULAR | Status: AC
  Filled 2019-01-09: qty 5

## 2019-01-09 MED ORDER — HYDROMORPHONE HCL 1 MG/ML IJ SOLN: 0.2500 mg | INTRAMUSCULAR | Status: DC | PRN

## 2019-01-09 MED ORDER — LACTATED RINGERS IV SOLN: INTRAVENOUS | Status: DC

## 2019-01-09 MED ORDER — LIDOCAINE 2% (20 MG/ML) 5 ML SYRINGE
INTRAMUSCULAR | Status: AC
  Filled 2019-01-09: qty 5

## 2019-01-09 MED ORDER — DEXAMETHASONE SODIUM PHOSPHATE 10 MG/ML IJ SOLN
INTRAMUSCULAR | Status: AC
  Filled 2019-01-09: qty 1

## 2019-01-09 MED ORDER — CEFAZOLIN SODIUM-DEXTROSE 2-4 GM/100ML-% IV SOLN
2.0000 g | INTRAVENOUS | Status: AC
  Administered 2019-01-09: 2 g via INTRAVENOUS
  Filled 2019-01-09: qty 100

## 2019-01-09 MED ORDER — LIDOCAINE HCL (CARDIAC) PF 100 MG/5ML IV SOSY
PREFILLED_SYRINGE | INTRAVENOUS | Status: DC | PRN
  Administered 2019-01-09: 100 mg via INTRAVENOUS

## 2019-01-09 MED ORDER — MEPERIDINE HCL 50 MG/ML IJ SOLN: 6.2500 mg | INTRAMUSCULAR | Status: DC | PRN

## 2019-01-09 MED ORDER — ONDANSETRON HCL 4 MG/2ML IJ SOLN: 4.0000 mg | Freq: Once | INTRAMUSCULAR | Status: DC | PRN

## 2019-01-09 MED ORDER — OXYCODONE HCL 5 MG PO TABS: 5.0000 mg | ORAL_TABLET | ORAL | 0 refills | Status: AC | PRN

## 2019-01-09 MED ORDER — PROPOFOL 10 MG/ML IV BOLUS
INTRAVENOUS | Status: AC
  Filled 2019-01-09: qty 20

## 2019-01-09 SURGICAL SUPPLY — 69 items
BANDAGE ACE 4X5 VEL STRL LF (GAUZE/BANDAGES/DRESSINGS) ×2 IMPLANT
BIT DRILL PROS QC 1.5 (BIT) ×1 IMPLANT
BIT DRILL PROS QC 1.5MM (BIT) ×1
BLADE AVERAGE 25MMX9MM (BLADE) ×1
BLADE AVERAGE 25X9 (BLADE) ×1 IMPLANT
BLADE OSCILLATING/SAGITTAL (BLADE) ×2
BLADE SW THK.38XMED NAR THN (BLADE) IMPLANT
BNDG COHESIVE 4X5 TAN STRL (GAUZE/BANDAGES/DRESSINGS) ×4 IMPLANT
BNDG ESMARK 4X9 LF (GAUZE/BANDAGES/DRESSINGS) IMPLANT
BNDG GAUZE ELAST 4 BULKY (GAUZE/BANDAGES/DRESSINGS) ×4 IMPLANT
BNDG GAUZE STRTCH 6 (GAUZE/BANDAGES/DRESSINGS) ×4 IMPLANT
BRUSH SCRUB SURG 4.25 DISP (MISCELLANEOUS) ×4 IMPLANT
CANISTER SUCTION WELLS/JOHNSON (MISCELLANEOUS) ×4 IMPLANT
CHLORAPREP W/TINT 26ML (MISCELLANEOUS) ×4 IMPLANT
COVER SURGICAL LIGHT HANDLE (MISCELLANEOUS) ×8 IMPLANT
COVER WAND RF STERILE (DRAPES) ×4 IMPLANT
DRAPE C-ARM MINI 42X72 WSTRAPS (DRAPES) ×2 IMPLANT
DRAPE C-ARMOR (DRAPES) IMPLANT
DRAPE U-SHAPE 47X51 STRL (DRAPES) ×4 IMPLANT
DRSG ADAPTIC 3X8 NADH LF (GAUZE/BANDAGES/DRESSINGS) ×4 IMPLANT
DRSG PAD ABDOMINAL 8X10 ST (GAUZE/BANDAGES/DRESSINGS) IMPLANT
DRSG XEROFORM 1X8 (GAUZE/BANDAGES/DRESSINGS) IMPLANT
DURAPREP 26ML APPLICATOR (WOUND CARE) ×4 IMPLANT
ELECT REM PT RETURN 9FT ADLT (ELECTROSURGICAL) ×4
ELECTRODE REM PT RTRN 9FT ADLT (ELECTROSURGICAL) ×2 IMPLANT
GAUZE SPONGE 4X4 12PLY STRL (GAUZE/BANDAGES/DRESSINGS) ×4 IMPLANT
GAUZE XEROFORM 5X9 LF (GAUZE/BANDAGES/DRESSINGS) ×2 IMPLANT
GLOVE BIOGEL M STRL SZ7.5 (GLOVE) ×12 IMPLANT
GLOVE BIOGEL PI IND STRL 8 (GLOVE) ×2 IMPLANT
GLOVE BIOGEL PI INDICATOR 8 (GLOVE) ×2
GLOVE INDICATOR 8.0 STRL GRN (GLOVE) ×4 IMPLANT
GLOVE SURG SS PI 8.0 STRL IVOR (GLOVE) ×2 IMPLANT
GOWN STRL REUS W/ TWL LRG LVL3 (GOWN DISPOSABLE) ×4 IMPLANT
GOWN STRL REUS W/TWL LRG LVL3 (GOWN DISPOSABLE) ×4
HANDPIECE INTERPULSE COAX TIP (DISPOSABLE)
K-WIRE .035 (WIRE) ×12
K-WIRE .045X4 (WIRE) ×6 IMPLANT
KIT BASIN OR (CUSTOM PROCEDURE TRAY) ×4 IMPLANT
KIT TURNOVER KIT B (KITS) ×4 IMPLANT
KWIRE .035 (WIRE) ×6 IMPLANT
KWIRE FIXATION L4 .035 (WIRE) IMPLANT
MANIFOLD NEPTUNE II (INSTRUMENTS) ×4 IMPLANT
NEEDLE 22X1 1/2 (OR ONLY) (NEEDLE) IMPLANT
NS IRRIG 1000ML POUR BTL (IV SOLUTION) ×4 IMPLANT
PACK ORTHO EXTREMITY (CUSTOM PROCEDURE TRAY) ×4 IMPLANT
PAD ABD 8X10 STRL (GAUZE/BANDAGES/DRESSINGS) ×2 IMPLANT
PAD ARMBOARD 7.5X6 YLW CONV (MISCELLANEOUS) ×8 IMPLANT
PAD CAST 4YDX4 CTTN HI CHSV (CAST SUPPLIES) IMPLANT
PADDING CAST COTTON 4X4 STRL (CAST SUPPLIES) ×2
PADDING CAST COTTON 6X4 STRL (CAST SUPPLIES) ×4 IMPLANT
SCREW CORTEX ST 2.0X12 (Screw) ×4 IMPLANT
SCREW CORTEX ST 2.0X16 (Screw) ×4 IMPLANT
SCREW CORTEX ST 2.0X20 (Screw) ×4 IMPLANT
SET HNDPC FAN SPRY TIP SCT (DISPOSABLE) IMPLANT
STOCKINETTE IMPERVIOUS 9X36 MD (GAUZE/BANDAGES/DRESSINGS) ×4 IMPLANT
SUT ETHILON 2 0 FS 18 (SUTURE) ×4 IMPLANT
SUT ETHILON 3 0 PS 1 (SUTURE) ×2 IMPLANT
SUT FIBERWIRE 2-0 18 17.9 3/8 (SUTURE) ×8
SUT MNCRL AB 3-0 PS2 18 (SUTURE) ×2 IMPLANT
SUT PDS AB 2-0 CT1 27 (SUTURE) ×2 IMPLANT
SUT PROLENE 0 CT (SUTURE) IMPLANT
SUTURE FIBERWR 2-0 18 17.9 3/8 (SUTURE) IMPLANT
SYR CONTROL 10ML LL (SYRINGE) IMPLANT
TOWEL OR 17X26 10 PK STRL BLUE (TOWEL DISPOSABLE) ×4 IMPLANT
TUBE CONNECTING 12'X1/4 (SUCTIONS) ×1
TUBE CONNECTING 12X1/4 (SUCTIONS) ×3 IMPLANT
UNDERPAD 30X30 INCONTINENT (UNDERPADS AND DIAPERS) ×4 IMPLANT
WATER STERILE IRR 1000ML POUR (IV SOLUTION) ×4 IMPLANT
YANKAUER SUCT BULB TIP NO VENT (SUCTIONS) ×4 IMPLANT

## 2019-01-09 NOTE — Progress Notes (Signed)
Orthopedic Tech Progress Note Patient Details:  Kathleen Wiley 1946/10/05 [id number]  Ortho Devices Type of Ortho Device: Prafo boot/shoe Ortho Device/Splint Interventions: Application   Post Interventions Patient Tolerated: Well Instructions Provided: Care of device   [name] 01/09/2019, 11:47 AM

## 2019-01-09 NOTE — Brief Op Note (Signed)
01/09/2019  10:54 AM  PATIENT:  Maceo Pro  73 y.o. 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  PROCEDURE:  Procedure(s): RIGHT FOOT BUNION CORRECTION (Right) RIGHT FOOT 2-4 HAMMERTOE CORRECTION WITH WEIL OSTEOTOMY 2ND METATARSAL, DEEP TENDON TRANSFER WITH AKIN OSTEOTOMY (Right)  SURGEON:  Surgeon(s) and Role:    * Erle Crocker, MD - Primary  PHYSICIAN ASSISTANT:   ASSISTANTS: Justin RNFA  ANESTHESIA:   general  EBL:  10 mL   BLOOD ADMINISTERED:none  DRAINS: none   LOCAL MEDICATIONS USED:  NONE  SPECIMEN:  No Specimen  DISPOSITION OF SPECIMEN:  N/A  COUNTS:  YES  TOURNIQUET:  * No tourniquets in log *  DICTATION: .Dragon Dictation  PLAN OF CARE: Discharge to home after PACU  PATIENT DISPOSITION:  PACU - hemodynamically stable.   Delay start of Pharmacological VTE agent (>24hrs) due to surgical blood loss or risk of bleeding: not applicable

## 2019-01-09 NOTE — Anesthesia Procedure Notes (Signed)
Procedure Name: LMA Insertion Date/Time: 01/09/2019 7:52 AM Performed by: Jenne Campus, CRNA Pre-anesthesia Checklist: Patient identified, Emergency Drugs available, Suction available and Patient being monitored Patient Re-evaluated:Patient Re-evaluated prior to induction Oxygen Delivery Method: Circle System Utilized Preoxygenation: Pre-oxygenation with 100% oxygen Induction Type: IV induction Ventilation: Mask ventilation without difficulty LMA: LMA inserted LMA Size: 4.0 Number of attempts: 1 Airway Equipment and Method: Bite block Placement Confirmation: positive ETCO2 and breath sounds checked- equal and bilateral Tube secured with: Tape Dental Injury: Teeth and Oropharynx as per pre-operative assessment

## 2019-01-09 NOTE — Op Note (Addendum)
Kathleen Wiley female 73 y.o. 01/09/2019  PreOperative Diagnosis: Right foot hallux valgus deformity Right foot hallux valgus interphalangeus Right foot second through fourth hammertoes Right foot second MTP dislocation   PostOperative Diagnosis: Same  PROCEDURE: Right foot modified McBride bunionectomy Right foot bunion correction with double osteotomy Right second MTP capsulotomy Right third MTP capsulotomy Right second toe PIP arthroplasty for hammertoe correction Right third toe PIP arthroplasty for hammertoe correction Right fourth toe PIP arthroplasty for hammertoe correction Right second toe, third toe extensor tendon lengthening Right second, third, fourth toe flexor tenotomy Open treatment of second MTP joint dislocation Deep tendon transfer of second toe FDL to EDL tendon Right second metatarsal osteotomy, Weil.  SURGEON: Melony Overly, MD  ASSISTANT: Larkin Ina, RNFA  ANESTHESIA: General LMA with peripheral nerve block  FINDINGS: See preoperative diagnosis  IMPLANTS: 2.0 millimeter screws, Synthes 045 and 035 K wires  INDICATIONS:72 y.o. female has had a painful bunion for years.  She failed conservative treatment the form of shoe modifications, activity modifications, anti-inflammatories.  She is also tried padding without relief.  She has a fixed hammertoe on the second toe and some deviation hammering of the third toe and fourth toe.  She was getting skin irritation and callus formation.  She was seen in the office and given her symptoms and degree of deformity she was indicated for the above procedures.  We discussed the risk, benefits alternatives of surgery which included but were not limited to wound healing complications, infection, nonunion, malunion, need for further surgery, continued pain, recurrence of her bunion or hammertoes.  After weighing these risks he opted to proceed.  She also understood the perioperative and anesthetic risk which included  death.  She agreed to comply with the postoperative weightbearing restrictions.  PROCEDURE: Patient was identified in the preoperative holding area.  The right leg was marked by myself and the patient.  The consent was signed by myself and the patient.  Anesthesia performed a peripheral nerve block.  She was taken the operative suite placed supine the operative table.  General anesthesia was induced without difficulty.  Preoperative antibiotics were given.  A bump was placed on the right hip.  All bony prominences well-padded.  The right lower extremity was prepped and draped in the usual sterile fashion.  A surgical timeout was performed.  Began by making a medial incision overlying the first MTP joint.  This taken sharply down through skin subcutaneous tissue.  This was taken along the first metatarsal back to just distal to the first TMT joint.  Blunt dissection was used to mobilize underlying veins which were cauterized.  Then sharp dissection was created through the joint capsule medially about the first and TP joint and down through the periosteal tissue.  Then capsule and periosteal flaps were created dorsally and plantarly.  The first MTP joint was identified and inspected.  There was a large bony prominence medially as well as dorsolaterally about the first MTP joint.  Using a rondure the dorsal prominence was removed.  Then the cartilaginous structures on the distal aspect of the first metatarsal and proximal phalanx was inspected and found to be intact.  Then using a sagittal saw the medial eminence was removed leaving a good cuff of bony tissue to avoid any hallux varus deformity postoperatively.  Then using a Valora Corporal the space between the first metatarsal head and the sesamoids was inspected.  Using a knife a lateral release of the capsular and sesamoidal ligaments were released.  There was  still some lateral soft tissue tightness and this was again inspected with a Freer and transected using a 15  blade.  This allowed for reduction of the first MTP joint.  Then the site of the scarf osteotomy was marked out dorsally along the metatarsal shaft and plantarly proximally.  Then using a sagittal saw scarf osteotomy was performed.  Using a towel clip the dorsal bone was grasped and the first metatarsal head was translated laterally.  Then the osteotomy site was clamped with a pointed reduction clamp.  Fluoroscopy was used to assure adequate sesamoidal reduction and first MTP alignment.  This was acceptable.  Then it was noted that there was hallux valgus interphalangeus and therefore an Akin osteotomy was performed and fixed with a 2.0 millimeter screw.  The 1st metatarsal osteotomy site was then fixed with three 2.0 millimeter screws and appropriate length was confirmed on fluoroscopy.  Then the overlying bone deformity corrected osteotomy was removed using a sagittal saw and flipped in place within the osteotomy site plantarly.  This was then sewn in place with a 2-0 PDS suture using the periosteal flaps.  Then the capsule tissue was debrided of redundant tissue and closed in a tight fashion using a 2-0 FiberWire stitch.  This was done in interrupted pants over vest fashion.  After capsular tightening the first MTP joint was in good position.  We then turned our attention to the hammertoes.  A longitudinal incision was made overlying the PIP joint of the second toe and taken down to the level of the MTP joint and then curved laterally overlying the third MTP joint.  This was taken sharply through skin subcutaneous tissue and blunt dissection was used to mobilize the superficial veins that were cauterized.  Then identified the EDL and EDB tendons to the second toe and the EDL tendon was transected distally and the EDB tendon was transected proximally to allow for extensor tendon lengthening.  Then the MTP capsule was identified and the joint was fully dislocated dorsally.  The capsule tissue was incised and the  joint was mobilized.  The collateral ligaments were incised laterally and medially to allow for reduction of the joint.  The soft tissue and skin was contracted due to the prolonged dislocation of the joint.  Once the joint was able to be reduced using a sagittal saw a Weil osteotomy of the second metatarsal was completed and fixed with a 2.0 millimeter screw.  This allowed for reduction of the second MTP joint without significant amount of tension.  The residual length of the second MTP was confirmed on fluoroscopy and found to be acceptable.  Then turned our attention to the third MTP joint.  The EDL and EDB tendons to the third toe were identified and mobilized.  Again the longus tendon was transected distally and the brevis tendon was transected proximally to allow for tendon lengthening and tenodesis.  Then the capsular tissue overlying the MTP joint was incised and the joint was mobilized.  The collateral ligaments medially and laterally were transected allowing the joint to fully reduce in a tension-free manner.  Then turned my attention to the second PIP joint.  An elliptical incision was made over the the dorsal capsular tissue.  This was removed exposing the joint.  The collateral ligaments were transected the joint was mobilized.  We then made a longitudinal incision through the skin separate incision overlying the PIP joint of the third toe.  This was taken sharply down through skin subcutaneous tissue  down to the underlying extensor tendon and joint capsule.  This was then opened and the joint was opened in an elliptical fashion.  Again the collateral ligaments laterally medially were transected to fully mobilize the joint.  We then turned our attention to the fourth toe and a separate longitudinal incision was made overlying the PIP joint.  Sharply taken with a 15 blade down through skin subcutaneous tissue and then the dorsal capsular tissue was incised exposing the PIP joint and an elliptical manner.   The collateral ligaments were released.  Then using a sagittal saw the distal aspect of the proximal phalanx and the proximal aspect of the distal phalanx of the second, third and fourth toes were all removed completing the PIP arthroplasties for these joints.  Then a hemostat was used to open up the plantar capsular tissue of each of these joints and the flexor digitorum longus tendons were brought through and tenotomized.  This was done for the second, third and fourth toes.  Then 045 and 035 K wires were placed in a proximal to distal manner at the tips of the toes and back across the PIP joint after it was reduced.  Bony prominences were removed with a rondure at the arthroplasty site of the second, third and fourth toes.  Then the second MTP joint was placed in a reduced position in the 045 K wire was advanced across the MTP joint.  This was then carried out for the third and fourth toes.  Then using a 15 blade soft tissue flap was created on the medial aspect of the proximal phalanx of the second toe.  Then the sheath for the flexor digitorum longus tendon was identified and incised.  Using a hemostat the flexor digitorum longus tendon was mobilized through this and routed above to the top of the toe.  This was then sutured into the extensor digitorum longus tendon overlying the proximal phalanx with a 2-0 PDS suture and this completed the deep tendon transfer portion of the case.  Then fluoroscopy was used to take final x-rays demonstrating that the hallux valgus correction and second and third and fourth hammertoe correction was completed and acceptable.  Then the tourniquet was released.  Hemostasis was obtained.  There was some venous congestion within the second toe but it appeared to pink up well.  The wounds were irrigated copiously with normal saline.  The extensor tendons of the second and third toe were then repaired in a lengthened manner using 2-0 PDS.  This was done to noticing the extensor  digitorum longus to the extensor digitorum brevis tendons.  Then the subcuticular tissue was closed with a 3-0 Monocryl and the skin with a 3-0 nylon.  She tolerated this well.  There were no complications.  A sterile soft dressing was placed.  This consisted of Xeroform, 4 x 4's, a 2 inch Kling wrap bunion dressing, sterile sheet cotton and a 4 inch Ace wrap.  After the dressing was placed the pins were cut and Jurgens balls were placed on the L4-5 K wires.  The 035 K wires were bent.  Counts were correct at the end of the case.  She was taken to recovery in stable condition.  POST OPERATIVE INSTRUCTIONS: Heel weightbearing to right lower extremity in a postoperative shoe Keep limb elevated Keep dressing in place until follow-up She will follow-up in 2 weeks for dressing removal, x-rays, suture removal if appropriate and replacement of a bunion dressing. No need for DVT prophylaxis in  this ambulatory patient   BLOOD LOSS:  less than 50 mL         DRAINS: none         SPECIMEN: none       COMPLICATIONS:  * No complications entered in OR log *         Disposition: PACU - hemodynamically stable.         Condition: stable

## 2019-01-09 NOTE — Anesthesia Preprocedure Evaluation (Addendum)
Anesthesia Evaluation  Patient identified by MRN, date of birth, ID band Patient awake    Reviewed: Allergy & Precautions, NPO status , Patient's Chart, lab work & pertinent test results  History of Anesthesia Complications (+) PONV  Airway Mallampati: I  TM Distance: >3 FB Neck ROM: Full    Dental  (+) Teeth Intact, Dental Advisory Given,    Pulmonary former smoker,    Pulmonary exam normal breath sounds clear to auscultation       Cardiovascular hypertension, Pt. on medications Normal cardiovascular exam+ dysrhythmias Atrial Fibrillation  Rhythm:Regular Rate:Normal     Neuro/Psych PSYCHIATRIC DISORDERS Anxiety    GI/Hepatic GERD  Medicated and Controlled,  Endo/Other    Renal/GU      Musculoskeletal   Abdominal   Peds  Hematology   Anesthesia Other Findings Jehovah's Witness - pt OK with albumin  Reproductive/Obstetrics                           Anesthesia Physical Anesthesia Plan  ASA: II  Anesthesia Plan: General   Post-op Pain Management:  Regional for Post-op pain   Induction: Intravenous  PONV Risk Score and Plan: 4 or greater and Ondansetron, Midazolam and Treatment may vary due to age or medical condition  Airway Management Planned: LMA  Additional Equipment:   Intra-op Plan:   Post-operative Plan: Extubation in OR  Informed Consent: I have reviewed the patients History and Physical, chart, labs and discussed the procedure including the risks, benefits and alternatives for the proposed anesthesia with the patient or authorized representative who has indicated his/her understanding and acceptance.       Plan Discussed with: CRNA and Surgeon  Anesthesia Plan Comments:         Anesthesia Quick Evaluation

## 2019-01-09 NOTE — H&P (Signed)
Kathleen Wiley is an 73 y.o. female.   Chief Complaint: Right foot bunion and 2 through 4 hammertoes, symptomatic. HPI: Patient has had longstanding bunion and hammertoes.  She has significant pain in her forefoot due to the deformity.  She has difficulty with shoe wear, activity and pain.  She has been recalcitrant to conservative treatment the form of shoe modifications, anti-inflammatories, toe spacers, toe sleeves and pads.  She is here today for surgical correction.  She denies any recent fevers or chills.  She continues to have sharp pain in her right forefoot.  Past Medical History:  Diagnosis Date  . Anxiety   . Arthritis    knee, hip  . Cellulitis 08/2016   left leg  . Complication of anesthesia   . Dysrhythmia    Afib due to Flagyl- 7 -2007- not problems since  . GERD (gastroesophageal reflux disease)   . Heart murmur    "slight "  informed years ago.-   . Hernia, umbilical   . History of atrial fibrillation 2007   with flagyl  . History of endometriosis   . Hypercholesteremia   . Hypertension    has not been to a cardiologist  . PONV (postoperative nausea and vomiting)   . Pre-diabetes   . Rectal vaginal fistula   . Refusal of blood transfusions as patient is Jehovah's Witness     Past Surgical History:  Procedure Laterality Date  . APPENDECTOMY    . EXPLORATORY LAPAROTOMY  1973   for endometrosis  . I&D EXTREMITY Right 06/10/2016   Procedure: IRRIGATION AND DEBRIDEMENT THUMB FLEXOR SHEATH;  Surgeon: Leanora Cover, MD;  Location: [facility];  Service: Orthopedics;  Laterality: Right;  . JOINT REPLACEMENT     Left Hip  . Media  . rectovaginal fistula repair  1980  . TOTAL HIP ARTHROPLASTY  05/08/2012   Procedure: TOTAL HIP ARTHROPLASTY;  Surgeon: Kerin Salen, MD;  Location: [facility];  Service: Orthopedics;  Laterality: Left;  . TOTAL HIP ARTHROPLASTY Right 09/18/2018   Procedure: TOTAL HIP ARTHROPLASTY ANTERIOR APPROACH;  Surgeon: Frederik Pear, MD;   Location: [facility];  Service: Orthopedics;  Laterality: Right;  . TOTAL KNEE ARTHROPLASTY Right 01/24/2013   Dr Mayer Camel  . TOTAL KNEE ARTHROPLASTY Right 01/24/2013   Procedure: RIGHT TOTAL KNEE ARTHROPLASTY;  Surgeon: Kerin Salen, MD;  Location: [facility];  Service: Orthopedics;  Laterality: Right;    Family History  Problem Relation Age of Onset  . Breast cancer Neg Hx    Social History:  reports that she quit smoking about 37 years ago. She has a 10.00 pack-year smoking history. She has never used smokeless tobacco. She reports that she does not drink alcohol or use drugs.  Allergies:  Allergies  Allergen Reactions  . Other     Refuses whole blood but does accept albumin  . Metronidazole Other (See Comments)    Chest pain, A-fib   . Septra [Sulfamethoxazole-Trimethoprim] Other (See Comments)    Blisters     Medications Prior to Admission  Medication Sig Dispense Refill  . Cholecalciferol (VITAMIN D) 50 MCG (2000 UT) tablet Take 2,000 Units by mouth daily.     . cyanocobalamin 1000 MCG tablet Take 1,000 mcg by mouth daily.     . diphenhydrAMINE (BENADRYL) 25 MG tablet Take 25 mg by mouth at bedtime.    Marland Kitchen escitalopram (LEXAPRO) 10 MG tablet Take 10 mg by mouth daily.    Marland Kitchen gemfibrozil (LOPID) 600 MG tablet  Take 600 mg by mouth 2 (two) times daily before a meal.    . ibuprofen (ADVIL,MOTRIN) 200 MG tablet Take 600 mg by mouth 2 (two) times daily.    Marland Kitchen omeprazole (PRILOSEC) 20 MG capsule Take 20 mg by mouth daily.    . Potassium 99 MG TABS Take 99 mg by mouth daily.    Marland Kitchen triamterene-hydrochlorothiazide (MAXZIDE) 75-50 MG per tablet Take 1 tablet by mouth daily.      Results for orders placed or performed during the hospital encounter of 01/09/19 (from the past 48 hour(s))  CBC     Status: Abnormal   Collection Time: 01/09/19  6:25 AM  Result Value Ref Range   WBC 2.9 (L) 4.0 - 10.5 K/uL   RBC 3.93 3.87 - 5.11 MIL/uL   Hemoglobin 11.8 (L) 12.0 - 15.0 g/dL   HCT 37.7 36.0 - 46.0 %    MCV 95.9 80.0 - 100.0 fL   MCH 30.0 26.0 - 34.0 pg   MCHC 31.3 30.0 - 36.0 g/dL   RDW 13.6 11.5 - 15.5 %   Platelets 163 150 - 400 K/uL   nRBC 0.0 0.0 - 0.2 %    Comment: Performed at [facility], [place] [address] [place], [place] [address]  Basic metabolic panel     Status: Abnormal   Collection Time: 01/09/19  6:25 AM  Result Value Ref Range   Sodium 140 135 - 145 mmol/L   Potassium 3.4 (L) 3.5 - 5.1 mmol/L   Chloride 107 98 - 111 mmol/L   CO2 25 22 - 32 mmol/L   Glucose, Bld 140 (H) 70 - 99 mg/dL   BUN 25 (H) 8 - 23 mg/dL   Creatinine, Ser 0.94 0.44 - 1.00 mg/dL   Calcium 9.2 8.9 - 10.3 mg/dL   GFR calc non Af Amer >60 >60 mL/min   GFR calc Af Amer >60 >60 mL/min   Anion gap 8 5 - 15    Comment: Performed at [facility], [place] [address] [place], [place] [address]   No results found.  Review of Systems  Constitutional: Negative.   HENT: Negative.   Eyes: Negative.   Respiratory: Negative.   Cardiovascular: Negative.   Gastrointestinal: Negative.   Musculoskeletal:       Right foot pain and deformity  Skin: Negative.   Neurological: Negative.   Psychiatric/Behavioral: Negative.     Blood pressure 137/65, pulse 64, temperature 98.2 F (36.8 C), temperature source Oral, resp. rate 18, height 5\' 5"  (1.651 m), weight 104.3 kg, SpO2 95 %. Physical Exam  Constitutional: She appears well-developed.  HENT:  Head: Normocephalic.  Eyes: Conjunctivae are normal.  Neck: Neck supple.  Cardiovascular: Normal rate.  Respiratory: Effort normal.  GI: Soft.  Musculoskeletal:     Comments: Right foot shows bunion deformity with erythema over the medial eminence.  Second hammertoe with elevation and fixed PIP flexion contracture.  Third toe with varus deviation, elevation and tenderness to palpation of the PIP joint.  Fourth toe with callus formation malleting.  She has intact sensation dorsally and plantarly about the foot.  Foot is warm and well-perfused with palpable  dorsalis pedis pulse.  Active ankle dorsiflexion plantarflexion intact.  Neurological: She is alert.  Skin: Skin is warm.  Psychiatric: She has a normal mood and affect.     Assessment/Plan We will proceed with planned surgical intervention in the form of right foot bunion correction with first metatarsal osteotomy versus double osteotomy.  We will also correct second third and fourth hammertoes.  She may require a Weil osteotomy and this will be decided at the time of surgery.  Will likely require deep tendon transfer given the varus valgus deformity of her toes.  She understands the risk, benefits alternatives surgery which include but are not limited to wound healing complications, infection, nonunion, malunion, continued pain, recurrence of her bunion and hammertoes.  We also discussed the perioperative and anesthetic risk which include death.  She understands the weightbearing restrictions postoperatively and agrees to comply.  We will proceed with surgery.  Erle Crocker, MD 01/09/2019, 7:09 AM

## 2019-01-09 NOTE — Discharge Instructions (Signed)
DR. Lucia Gaskins FOOT [facility] ANKLE SURGERY POST-OP INSTRUCTIONS   Pain Management 1. The numbing medicine and your leg will last around 8 hours, take a dose of your pain medicine as soon as you feel it wearing off to avoid rebound pain. 2. Keep your foot elevated above heart level.  Make sure that your heel hangs free ('floats'). 3. Take all prescribed medication as directed. 4. If taking narcotic pain medication you may want to use an over-the-counter stool softener to avoid constipation. 5. You may take over-the-counter NSAIDs (ibuprofen, naproxen, etc.) as well as over-the-counter acetaminophen as directed on the packaging as a supplement for your pain and may also use it to wean away from the prescription medication.  Activity ? Heel weightbearing as tolerated in a post op shoe boot ?   First Postoperative Visit 1. Your first postop visit will be at least 2 weeks after surgery.  This should be scheduled when you schedule surgery. 2. If you do not have a postoperative visit scheduled please call [phone] to schedule an appointment. 3. At the appointment your incision will be evaluated for suture removal, x-rays will be obtained if necessary.  General Instructions 1. Swelling is very common after foot and ankle surgery.  It often takes 3 months for the foot and ankle to begin to feel comfortable.  Some amount of swelling will persist for 6-12 months. 2. DO NOT change the dressing.  If there is a problem with the dressing (too tight, loose, gets wet, etc.) please contact Dr. Pollie Friar office. 3. DO NOT get the dressing wet.  For showers you can use an over-the-counter cast cover or wrap a washcloth around the top of your dressing and then cover it with a plastic bag and tape it to your leg. 4. DO NOT soak the incision (no tubs, pools, bath, etc.) until you have approval from Dr. Lucia Gaskins.  Contact Dr. Huel Cote office or go to Emergency Room if: 1. Temperature above 101 F. 2. Increasing pain that is  unresponsive to pain medication or elevation 3. Excessive redness or swelling in your foot 4. Dressing problems - excessive bloody drainage, looseness or tightness, or if dressing gets wet 5. Develop pain, swelling, warmth, or discoloration of your calf

## 2019-01-09 NOTE — Transfer of Care (Signed)
Immediate Anesthesia Transfer of Care Note  Patient: Kathleen Wiley  Procedure(s) Performed: RIGHT FOOT BUNION CORRECTION (Right ) RIGHT FOOT 2-4 HAMMERTOE CORRECTION WITH WEIL OSTEOTOMY 2ND METATARSAL, DEEP TENDON TRANSFER WITH AKIN OSTEOTOMY (Right Foot)  Patient Location: PACU  Anesthesia Type:General and Regional  Level of Consciousness: oriented, drowsy and patient cooperative  Airway & Oxygen Therapy: Patient Spontanous Breathing and Patient connected to nasal cannula oxygen  Post-op Assessment: Report given to RN and Post -op Vital signs reviewed and stable  Post vital signs: Reviewed  Last Vitals:  Vitals Value Taken Time  BP 127/76 01/09/2019 10:53 AM  Temp    Pulse 79 01/09/2019 10:55 AM  Resp 14 01/09/2019 10:55 AM  SpO2 92 % 01/09/2019 10:55 AM  Vitals shown include unvalidated device data.  Last Pain:  Vitals:   01/09/19 0637  TempSrc:   PainSc: 5       Patients Stated Pain Goal: 3 (87/57/97 2820)  Complications: No apparent anesthesia complications

## 2019-01-09 NOTE — Anesthesia Postprocedure Evaluation (Signed)
Anesthesia Post Note  Patient: Kathleen Wiley  Procedure(s) Performed: RIGHT FOOT BUNION CORRECTION (Right ) RIGHT FOOT 2-4 HAMMERTOE CORRECTION WITH WEIL OSTEOTOMY 2ND METATARSAL, DEEP TENDON TRANSFER WITH AKIN OSTEOTOMY (Right Foot)     Patient location during evaluation: PACU Anesthesia Type: General Level of consciousness: awake and alert Pain management: pain level controlled Vital Signs Assessment: post-procedure vital signs reviewed and stable Respiratory status: spontaneous breathing, nonlabored ventilation, respiratory function stable and patient connected to nasal cannula oxygen Cardiovascular status: blood pressure returned to baseline and stable Postop Assessment: no apparent nausea or vomiting Anesthetic complications: no    Last Vitals:  Vitals:   01/09/19 1200 01/09/19 1220  BP:  124/76  Pulse: 74 74  Resp: 13 18  Temp: (!) 36.4 C   SpO2: 93% 94%    Last Pain:  Vitals:   01/09/19 1220  TempSrc:   PainSc: 0-No pain                 [name] DAVID

## 2019-01-10 ENCOUNTER — Encounter (HOSPITAL_COMMUNITY): Payer: Self-pay | Admitting: Orthopaedic Surgery

## 2019-08-03 ENCOUNTER — Other Ambulatory Visit: Payer: Self-pay | Admitting: Family Medicine

## 2019-08-03 DIAGNOSIS — R7401 Elevation of levels of liver transaminase levels: Secondary | ICD-10-CM

## 2019-08-07 ENCOUNTER — Ambulatory Visit
Admission: RE | Admit: 2019-08-07 | Discharge: 2019-08-07 | Disposition: A | Discharge status: Home / Self Care | Payer: Medicare Other | Source: Ambulatory Visit | Attending: Family Medicine | Admitting: Family Medicine

## 2019-08-07 DIAGNOSIS — R7401 Elevation of levels of liver transaminase levels: Secondary | ICD-10-CM

## 2019-08-07 IMAGING — US US ABDOMEN LIMITED
1 series · 13 of 25 positions shown · non-contrast
Comparison: None.

CLINICAL DATA: Elevated liver enzymes

EXAM:
ULTRASOUND ABDOMEN LIMITED RIGHT UPPER QUADRANT

[Series 1: us abdomen limited · 0.23mm/px · 13 of 49 slices shown]
[im 1/49]
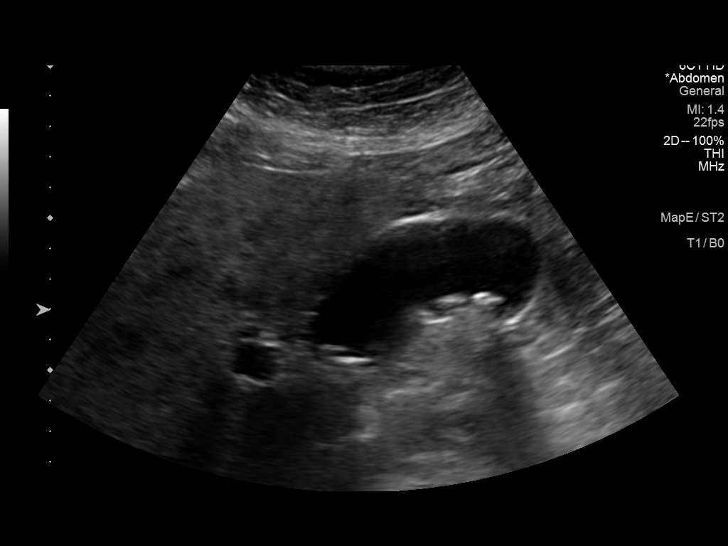
[im 5/49]
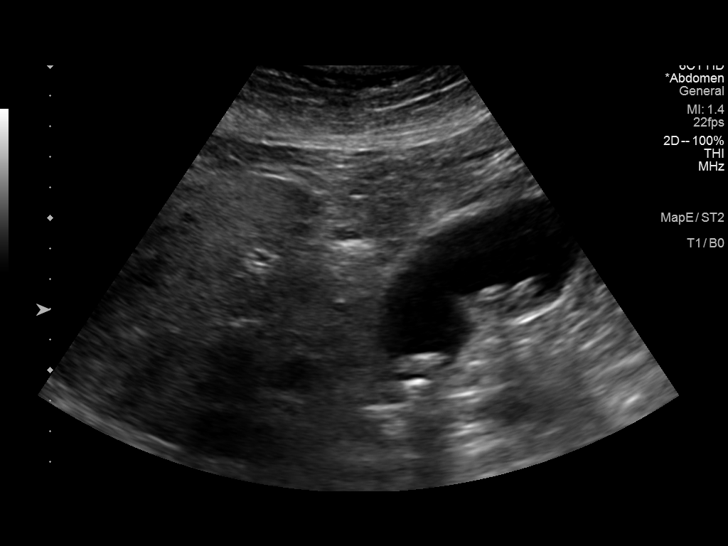
[im 9/49]
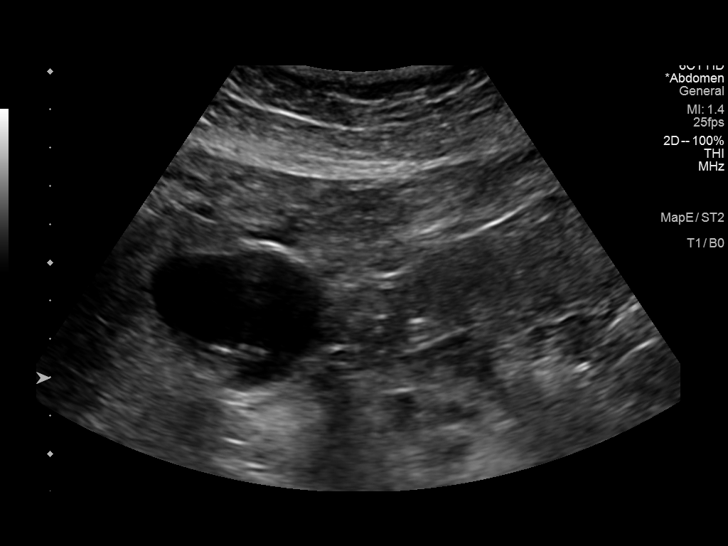
[im 13/49]
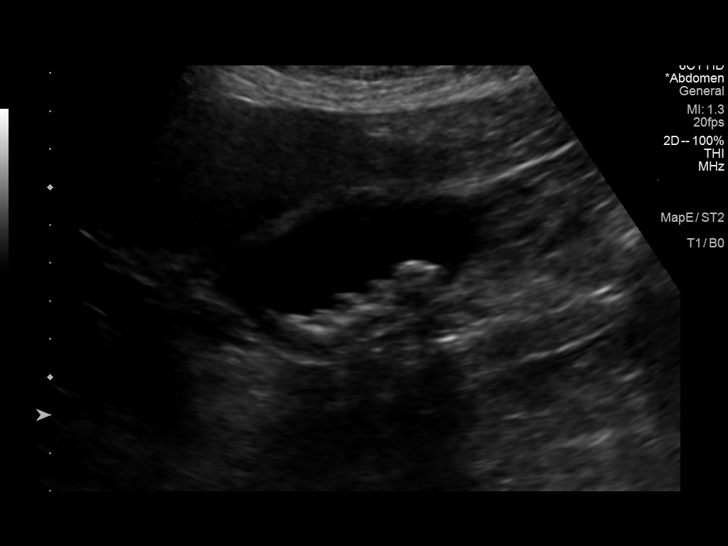
[im 17/49]
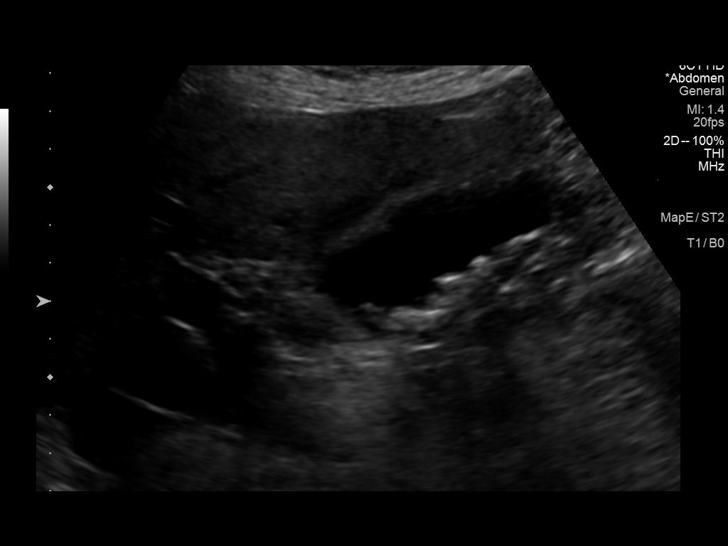
[im 21/49]
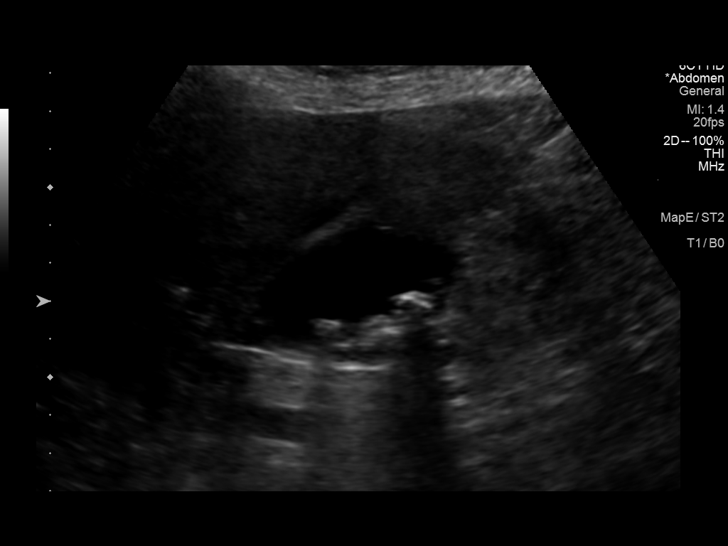
[im 25/49]
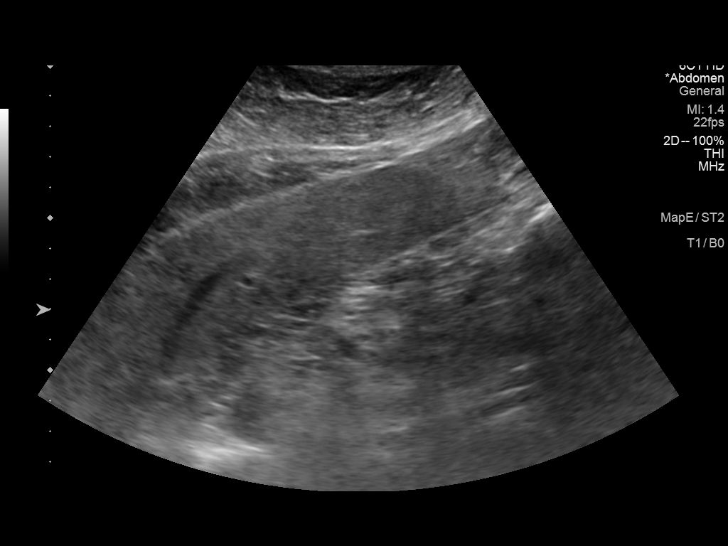
[im 29/49]
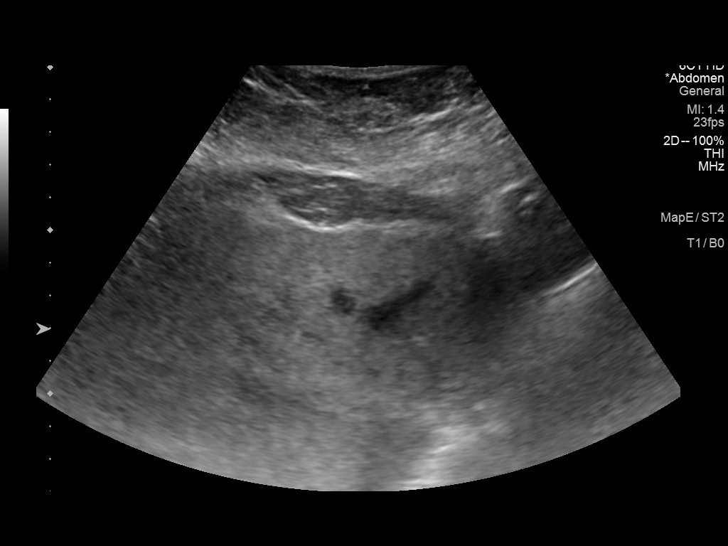
[im 33/49]
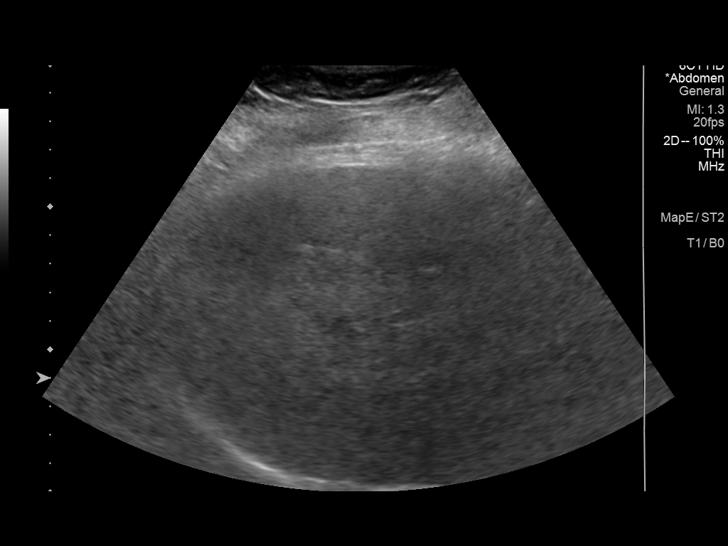
[im 37/49]
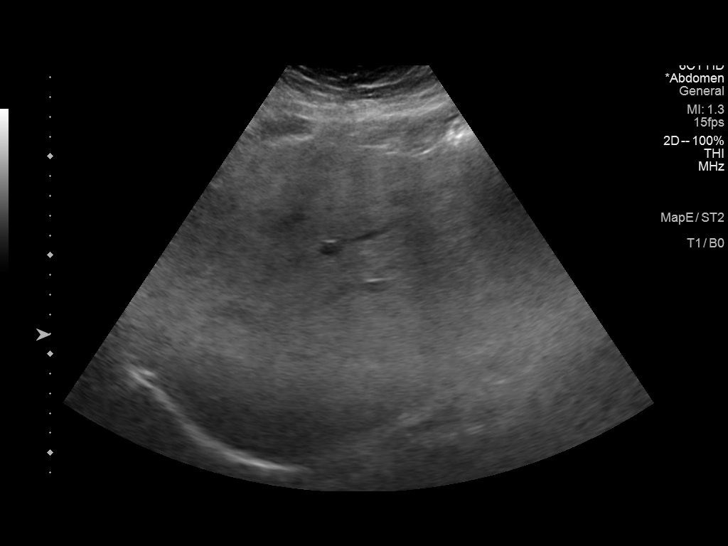
[im 41/49]
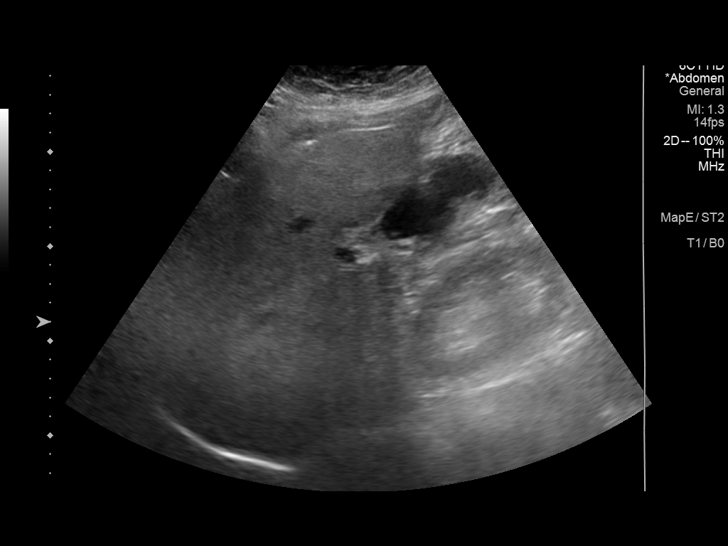
[im 45/49]
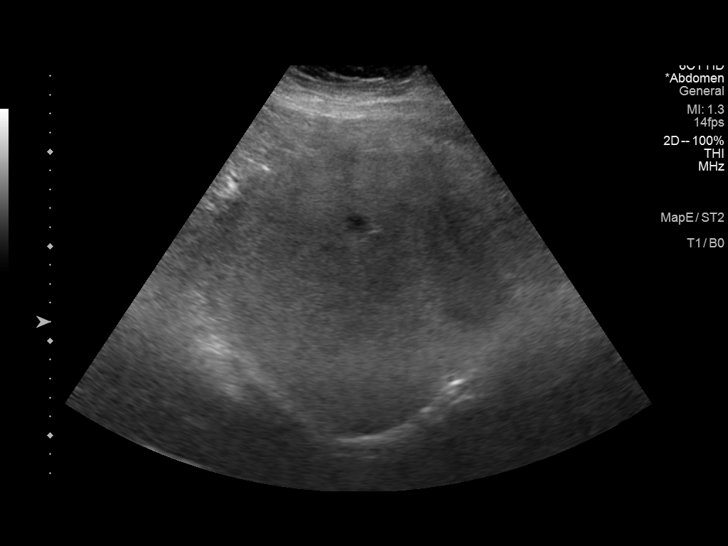
[im 49/49]
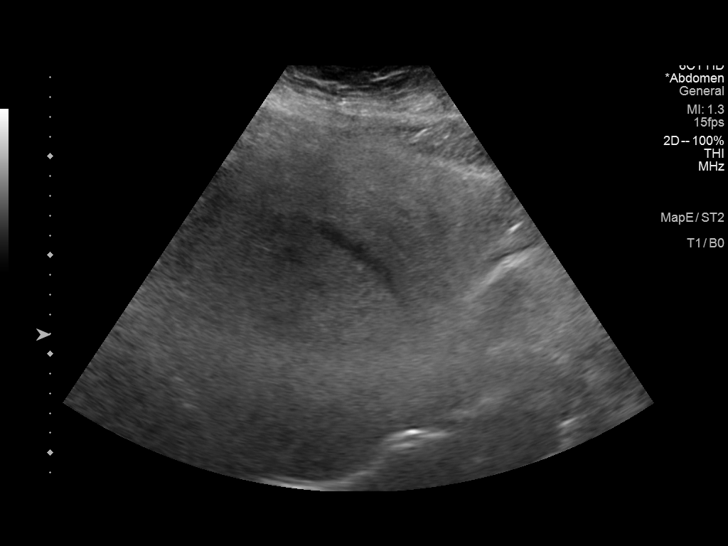

[13 of 25 positions shown; findings below may reference images not displayed]

FINDINGS: Gallbladder:

Within the gallbladder, there are echogenic foci which move and
shadow consistent with cholelithiasis. Largest gallstone measures
1.5 cm in length. There is no gallbladder wall thickening or
pericholecystic fluid. No sonographic Murphy sign noted by
sonographer.

Common bile duct:

Diameter: 8 mm, prominent. No intrahepatic biliary duct dilatation.
There is evidence of tapering of the common bile duct more distally.
Note that portions of the common bile duct are obscured by gas.

Liver:

No focal lesion identified. Liver echogenicity is increased
diffusely. Portal vein is patent on color Doppler imaging with
normal direction of blood flow towards the liver.

Other: None.
IMPRESSION: 1. Cholelithiasis. No gallbladder wall thickening or pericholecystic
fluid.

2. Prominence of the proximal common bile duct. No obstructing
lesion evident; note, however, that portions of the common bile duct
obscured by gas. From an imaging standpoint, MRCP would be the
optimum study for further assessment of the biliary duct system.

3. Diffuse increase in liver echogenicity, a finding indicative of
hepatic steatosis. While no focal liver lesions are evident on this
study, it must be cautioned that the sensitivity of ultrasound for
detection of focal liver lesions is diminished significantly in this
circumstance.

## 2019-08-23 ENCOUNTER — Other Ambulatory Visit (HOSPITAL_COMMUNITY): Payer: Self-pay | Admitting: Family Medicine

## 2019-08-23 DIAGNOSIS — R600 Localized edema: Secondary | ICD-10-CM

## 2019-08-24 ENCOUNTER — Ambulatory Visit (HOSPITAL_COMMUNITY): Payer: Medicare Other | Attending: Cardiology

## 2019-08-24 ENCOUNTER — Other Ambulatory Visit: Payer: Self-pay

## 2019-08-24 DIAGNOSIS — R600 Localized edema: Secondary | ICD-10-CM | POA: Diagnosis present

## 2019-08-24 MED ORDER — PERFLUTREN LIPID MICROSPHERE
1.0000 mL | INTRAVENOUS | Status: AC | PRN
  Administered 2019-08-24: 2 mL via INTRAVENOUS

## 2019-08-27 ENCOUNTER — Other Ambulatory Visit: Payer: Self-pay

## 2019-08-27 ENCOUNTER — Ambulatory Visit: Payer: Medicare Other | Admitting: Podiatry

## 2019-08-27 DIAGNOSIS — B353 Tinea pedis: Secondary | ICD-10-CM

## 2019-08-27 DIAGNOSIS — M79676 Pain in unspecified toe(s): Secondary | ICD-10-CM | POA: Diagnosis not present

## 2019-08-27 DIAGNOSIS — L989 Disorder of the skin and subcutaneous tissue, unspecified: Secondary | ICD-10-CM | POA: Diagnosis not present

## 2019-08-27 DIAGNOSIS — B351 Tinea unguium: Secondary | ICD-10-CM

## 2019-08-27 MED ORDER — CLOTRIMAZOLE-BETAMETHASONE 1-0.05 % EX CREA: 1.0000 "application " | TOPICAL_CREAM | Freq: Two times a day (BID) | CUTANEOUS | 1 refills | Status: DC

## 2019-08-27 MED ORDER — TERBINAFINE HCL 250 MG PO TABS: 250.0000 mg | ORAL_TABLET | Freq: Every day | ORAL | 0 refills | Status: DC

## 2019-08-30 NOTE — Progress Notes (Signed)
    Subjective: Patient is a 73 y.o. female presenting to the office today with a chief complaint of painful callus lesion(s) noted to the bilateral feet that have been present for the past several months. Walking increases the pain. She has not done anything for treatment at home.  Patient also complains of elongated, thickened nails that cause pain while ambulating in shoes. She is unable to trim her own nails. Patient presents today for further treatment and evaluation.  Past Medical History:  Diagnosis Date  . Anxiety   . Arthritis    knee, hip  . Cellulitis 08/2016   left leg  . Complication of anesthesia   . Dysrhythmia    Afib due to Flagyl- 7 -2007- not problems since  . GERD (gastroesophageal reflux disease)   . Heart murmur    "slight "  informed years ago.-   . Hernia, umbilical   . History of atrial fibrillation 2007   with flagyl  . History of endometriosis   . Hypercholesteremia   . Hypertension    has not been to a cardiologist  . PONV (postoperative nausea and vomiting)   . Pre-diabetes   . Rectal vaginal fistula   . Refusal of blood transfusions as patient is Jehovah's Witness     Objective:  Physical Exam General: Alert and oriented x3 in no acute distress  Dermatology: Hyperkeratotic lesion(s) present on the bilateral feet. Pain on palpation with a central nucleated core noted. Skin is warm, dry and supple bilateral lower extremities. Negative for open lesions or macerations. Nails are tender, long, thickened and dystrophic with subungual debris, consistent with onychomycosis, 1-5 bilateral. No signs of infection noted. Pruritus noted to the bilateral feet with hyperkeratosis.  Vascular: Palpable pedal pulses bilaterally. No edema or erythema noted. Capillary refill within normal limits.  Neurological: Epicritic and protective threshold grossly intact bilaterally.   Musculoskeletal Exam: Pain on palpation at the keratotic lesion(s) noted. Range of motion  within normal limits bilateral. Muscle strength 5/5 in all groups bilateral.  Assessment: 1. Onychodystrophic nails 1-5 bilateral with hyperkeratosis of nails.  2. Onychomycosis of nail due to dermatophyte bilateral 3. Pre-ulcerative callus lesions noted to the bilateral feet x 2 4. Tinea pedis bilateral    Plan of Care:  1. Patient evaluated. Patient declined X-Rays.  2. Excisional debridement of keratoic lesion(s) using a chisel blade was performed without incident.  3. Dressed with light dressing. 4. Mechanical debridement of nails 1-5 bilaterally performed using a nail nipper. Filed with dremel without incident.  5. Prescription for Lamisil 250 mg #28 provided to patient.  6. Prescription for Lotrisone cream provided to patient.  7. Patient is to return to the clinic in 3 months.   Edrick Kins, DPM [facility]  Dr. Edrick Kins, [place]                                        [place], [place] [address]                Office [phone]  Fax [phone]

## 2019-10-25 ENCOUNTER — Other Ambulatory Visit: Payer: Self-pay

## 2019-10-25 ENCOUNTER — Inpatient Hospital Stay (HOSPITAL_COMMUNITY)
Admission: EM | Admit: 2019-10-25 | Discharge: 2019-10-30 | DRG: 177 | Disposition: A | Discharge status: Home / Self Care | Payer: Medicare Other | Attending: Internal Medicine | Admitting: Internal Medicine

## 2019-10-25 ENCOUNTER — Emergency Department (HOSPITAL_COMMUNITY): Payer: Medicare Other

## 2019-10-25 ENCOUNTER — Encounter (HOSPITAL_COMMUNITY): Payer: Self-pay

## 2019-10-25 ENCOUNTER — Inpatient Hospital Stay (HOSPITAL_COMMUNITY): Payer: Medicare Other

## 2019-10-25 DIAGNOSIS — Z96651 Presence of right artificial knee joint: Secondary | ICD-10-CM | POA: Diagnosis present

## 2019-10-25 DIAGNOSIS — Z87891 Personal history of nicotine dependence: Secondary | ICD-10-CM

## 2019-10-25 DIAGNOSIS — J1289 Other viral pneumonia: Secondary | ICD-10-CM | POA: Diagnosis present

## 2019-10-25 DIAGNOSIS — K219 Gastro-esophageal reflux disease without esophagitis: Secondary | ICD-10-CM | POA: Diagnosis present

## 2019-10-25 DIAGNOSIS — Z888 Allergy status to other drugs, medicaments and biological substances status: Secondary | ICD-10-CM

## 2019-10-25 DIAGNOSIS — D72819 Decreased white blood cell count, unspecified: Secondary | ICD-10-CM | POA: Diagnosis not present

## 2019-10-25 DIAGNOSIS — U071 COVID-19: Secondary | ICD-10-CM | POA: Diagnosis present

## 2019-10-25 DIAGNOSIS — Z79899 Other long term (current) drug therapy: Secondary | ICD-10-CM

## 2019-10-25 DIAGNOSIS — M1990 Unspecified osteoarthritis, unspecified site: Secondary | ICD-10-CM | POA: Diagnosis present

## 2019-10-25 DIAGNOSIS — F419 Anxiety disorder, unspecified: Secondary | ICD-10-CM | POA: Diagnosis present

## 2019-10-25 DIAGNOSIS — E876 Hypokalemia: Secondary | ICD-10-CM | POA: Diagnosis present

## 2019-10-25 DIAGNOSIS — E785 Hyperlipidemia, unspecified: Secondary | ICD-10-CM | POA: Diagnosis present

## 2019-10-25 DIAGNOSIS — T380X5A Adverse effect of glucocorticoids and synthetic analogues, initial encounter: Secondary | ICD-10-CM | POA: Diagnosis present

## 2019-10-25 DIAGNOSIS — R748 Abnormal levels of other serum enzymes: Secondary | ICD-10-CM | POA: Diagnosis not present

## 2019-10-25 DIAGNOSIS — R0602 Shortness of breath: Secondary | ICD-10-CM | POA: Diagnosis present

## 2019-10-25 DIAGNOSIS — J9601 Acute respiratory failure with hypoxia: Secondary | ICD-10-CM | POA: Diagnosis present

## 2019-10-25 DIAGNOSIS — D696 Thrombocytopenia, unspecified: Secondary | ICD-10-CM | POA: Diagnosis not present

## 2019-10-25 DIAGNOSIS — R7303 Prediabetes: Secondary | ICD-10-CM | POA: Diagnosis present

## 2019-10-25 DIAGNOSIS — Z6839 Body mass index (BMI) 39.0-39.9, adult: Secondary | ICD-10-CM

## 2019-10-25 DIAGNOSIS — G252 Other specified forms of tremor: Secondary | ICD-10-CM | POA: Diagnosis not present

## 2019-10-25 DIAGNOSIS — Z96643 Presence of artificial hip joint, bilateral: Secondary | ICD-10-CM | POA: Diagnosis present

## 2019-10-25 DIAGNOSIS — I10 Essential (primary) hypertension: Secondary | ICD-10-CM | POA: Diagnosis present

## 2019-10-25 DIAGNOSIS — E7800 Pure hypercholesterolemia, unspecified: Secondary | ICD-10-CM | POA: Diagnosis present

## 2019-10-25 DIAGNOSIS — K529 Noninfective gastroenteritis and colitis, unspecified: Secondary | ICD-10-CM | POA: Diagnosis present

## 2019-10-25 DIAGNOSIS — E1165 Type 2 diabetes mellitus with hyperglycemia: Secondary | ICD-10-CM | POA: Diagnosis present

## 2019-10-25 DIAGNOSIS — J1282 Pneumonia due to coronavirus disease 2019: Secondary | ICD-10-CM | POA: Diagnosis present

## 2019-10-25 DIAGNOSIS — E871 Hypo-osmolality and hyponatremia: Secondary | ICD-10-CM | POA: Diagnosis present

## 2019-10-25 DIAGNOSIS — R739 Hyperglycemia, unspecified: Secondary | ICD-10-CM | POA: Diagnosis not present

## 2019-10-25 LAB — COMPREHENSIVE METABOLIC PANEL
ALT: 75 U/L — ABNORMAL HIGH (ref 0–44)
AST: 89 U/L — ABNORMAL HIGH (ref 15–41)
Albumin: 3.5 g/dL (ref 3.5–5.0)
Alkaline Phosphatase: 90 U/L (ref 38–126)
Anion gap: 15 (ref 5–15)
BUN: 20 mg/dL (ref 8–23)
CO2: 26 mmol/L (ref 22–32)
Calcium: 8.5 mg/dL — ABNORMAL LOW (ref 8.9–10.3)
Chloride: 96 mmol/L — ABNORMAL LOW (ref 98–111)
Creatinine, Ser: 0.95 mg/dL (ref 0.44–1.00)
GFR calc Af Amer: >60 mL/min (ref >60)
GFR calc non Af Amer: 60 mL/min — ABNORMAL LOW (ref >60)
Glucose, Bld: 153 mg/dL — ABNORMAL HIGH (ref 70–99)
Potassium: 3.4 mmol/L — ABNORMAL LOW (ref 3.5–5.1)
Sodium: 137 mmol/L (ref 135–145)
Total Bilirubin: 2.9 mg/dL — ABNORMAL HIGH (ref 0.3–1.2)
Total Protein: 7 g/dL (ref 6.5–8.1)

## 2019-10-25 LAB — GLUCOSE, CAPILLARY
Glucose-Capillary: 101 mg/dL — ABNORMAL HIGH (ref 70–99)
Glucose-Capillary: 173 mg/dL — ABNORMAL HIGH (ref 70–99)
Glucose-Capillary: 191 mg/dL — ABNORMAL HIGH (ref 70–99)

## 2019-10-25 LAB — CBC WITH DIFFERENTIAL/PLATELET
Abs Immature Granulocytes: 0.05 10*3/uL (ref 0.00–0.07)
Basophils Absolute: 0 10*3/uL (ref 0.0–0.1)
Basophils Relative: 1 %
Eosinophils Absolute: 0 10*3/uL (ref 0.0–0.5)
Eosinophils Relative: 0 %
HCT: 40.2 % (ref 36.0–46.0)
Hemoglobin: 13.2 g/dL (ref 12.0–15.0)
Immature Granulocytes: 2 %
Lymphocytes Relative: 22 %
Lymphs Abs: 0.6 10*3/uL — ABNORMAL LOW (ref 0.7–4.0)
MCH: 32.4 pg (ref 26.0–34.0)
MCHC: 32.8 g/dL (ref 30.0–36.0)
MCV: 98.5 fL (ref 80.0–100.0)
Monocytes Absolute: 0.2 10*3/uL (ref 0.1–1.0)
Monocytes Relative: 7 %
Neutro Abs: 1.7 10*3/uL (ref 1.7–7.7)
Neutrophils Relative %: 68 %
Platelets: 89 10*3/uL — ABNORMAL LOW (ref 150–400)
RBC: 4.08 MIL/uL (ref 3.87–5.11)
RDW: 14.6 % (ref 11.5–15.5)
WBC: 2.5 10*3/uL — ABNORMAL LOW (ref 4.0–10.5)
nRBC: 0 % (ref 0.0–0.2)

## 2019-10-25 LAB — LACTIC ACID, PLASMA: Lactic Acid, Venous: 1.4 mmol/L (ref 0.5–1.9)

## 2019-10-25 LAB — MAGNESIUM: Magnesium: 1.7 mg/dL (ref 1.7–2.4)

## 2019-10-25 LAB — D-DIMER, QUANTITATIVE: D-Dimer, Quant: 0.52 ug/mL-FEU — ABNORMAL HIGH (ref 0.00–0.50)

## 2019-10-25 LAB — FERRITIN: Ferritin: 481 ng/mL — ABNORMAL HIGH (ref 11–307)

## 2019-10-25 LAB — BILIRUBIN, DIRECT: Bilirubin, Direct: 1.3 mg/dL — ABNORMAL HIGH (ref 0.0–0.2)

## 2019-10-25 LAB — ABO/RH: ABO/RH(D): A POS

## 2019-10-25 LAB — PHOSPHORUS: Phosphorus: 3.4 mg/dL (ref 2.5–4.6)

## 2019-10-25 LAB — LACTATE DEHYDROGENASE: LDH: 283 U/L — ABNORMAL HIGH (ref 98–192)

## 2019-10-25 LAB — TROPONIN I (HIGH SENSITIVITY)
Troponin I (High Sensitivity): 6 ng/L (ref <18)
Troponin I (High Sensitivity): 5 ng/L (ref <18)

## 2019-10-25 LAB — TRIGLYCERIDES: Triglycerides: 168 mg/dL — ABNORMAL HIGH (ref <150)

## 2019-10-25 LAB — PROCALCITONIN: Procalcitonin: <0.1 ng/mL

## 2019-10-25 LAB — POC SARS CORONAVIRUS 2 AG -  ED: SARS Coronavirus 2 Ag: POSITIVE — AB

## 2019-10-25 LAB — C-REACTIVE PROTEIN: CRP: 4.5 mg/dL — ABNORMAL HIGH (ref <1.0)

## 2019-10-25 LAB — FIBRINOGEN: Fibrinogen: 520 mg/dL — ABNORMAL HIGH (ref 210–475)

## 2019-10-25 IMAGING — DX DG CHEST 1V PORT
2 series · 2 of 2 positions shown · non-contrast
Comparison: [DATE].

CLINICAL DATA: Pt reports tested positive for COVID yesterday. Pt
came into today due to increased SHOB, nausea, and diarrhea. Pt
hypoxic on arrival at 87%. Hx of HTN and pre-diabetes.

EXAM:
PORTABLE CHEST 1 VIEW

[chest ap (1 of 2)]
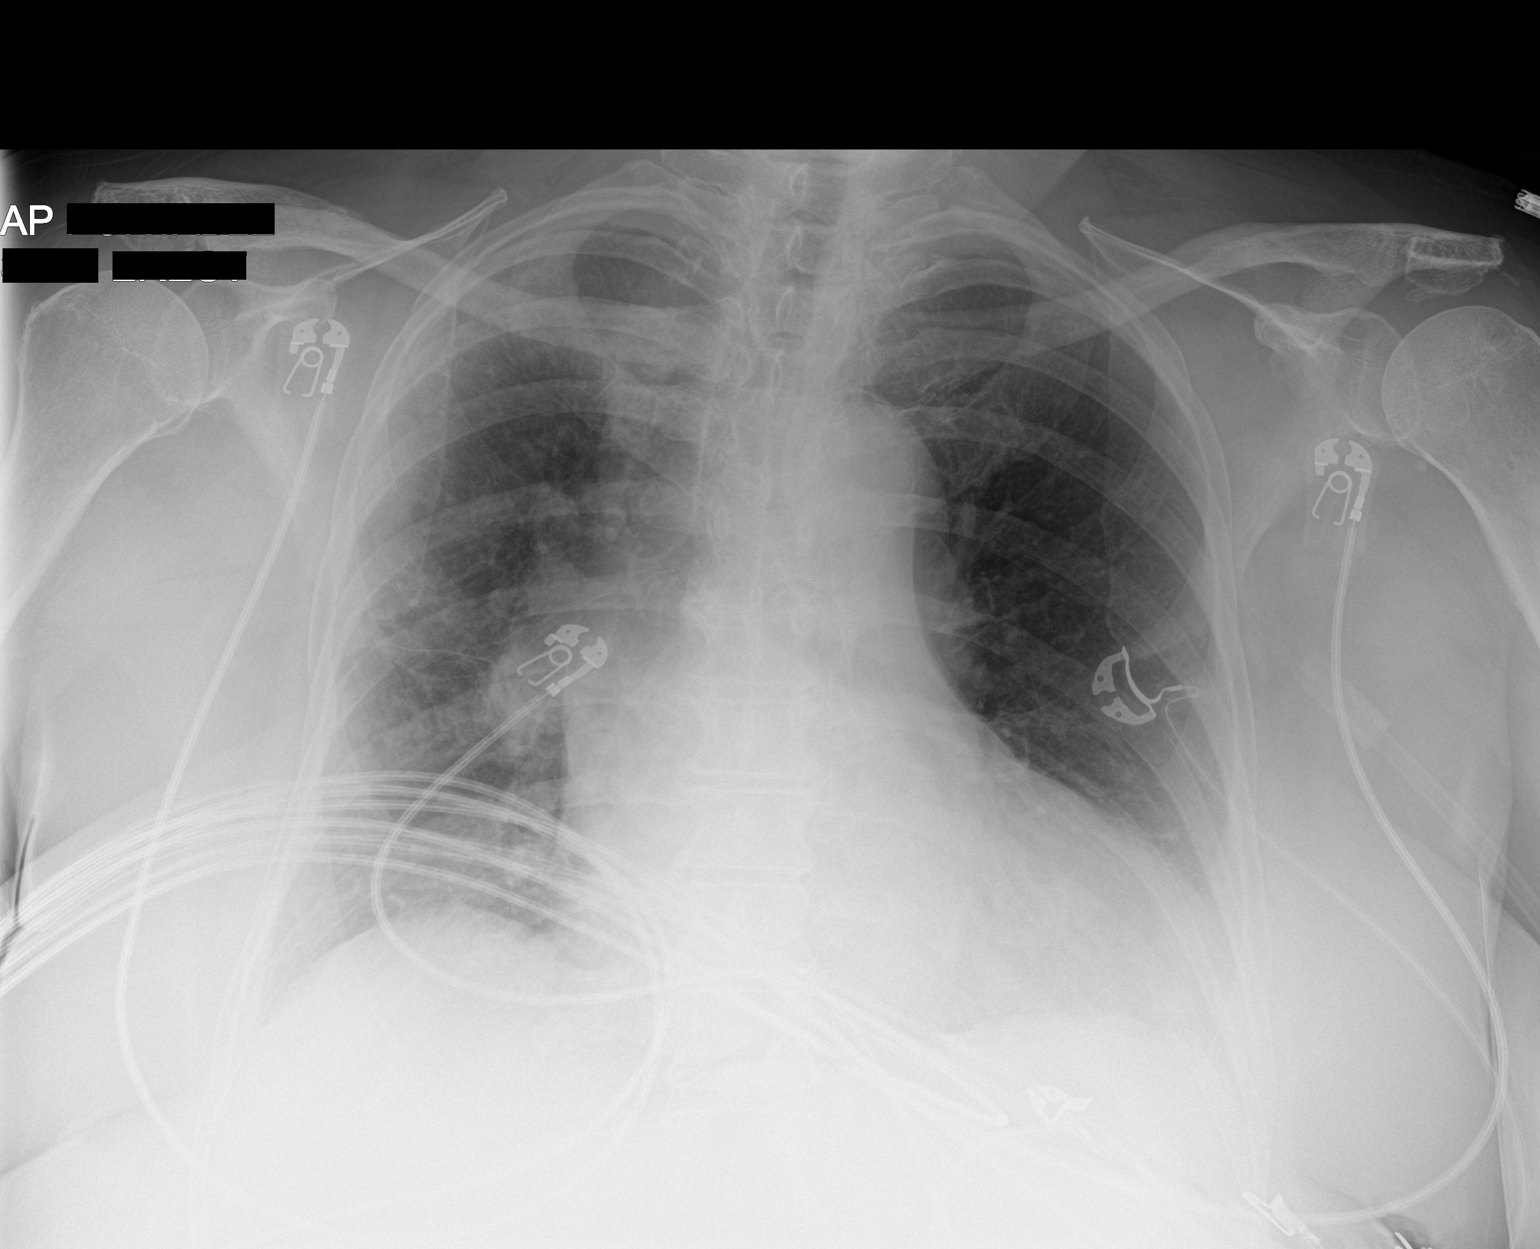

[chest ap (2 of 2)]
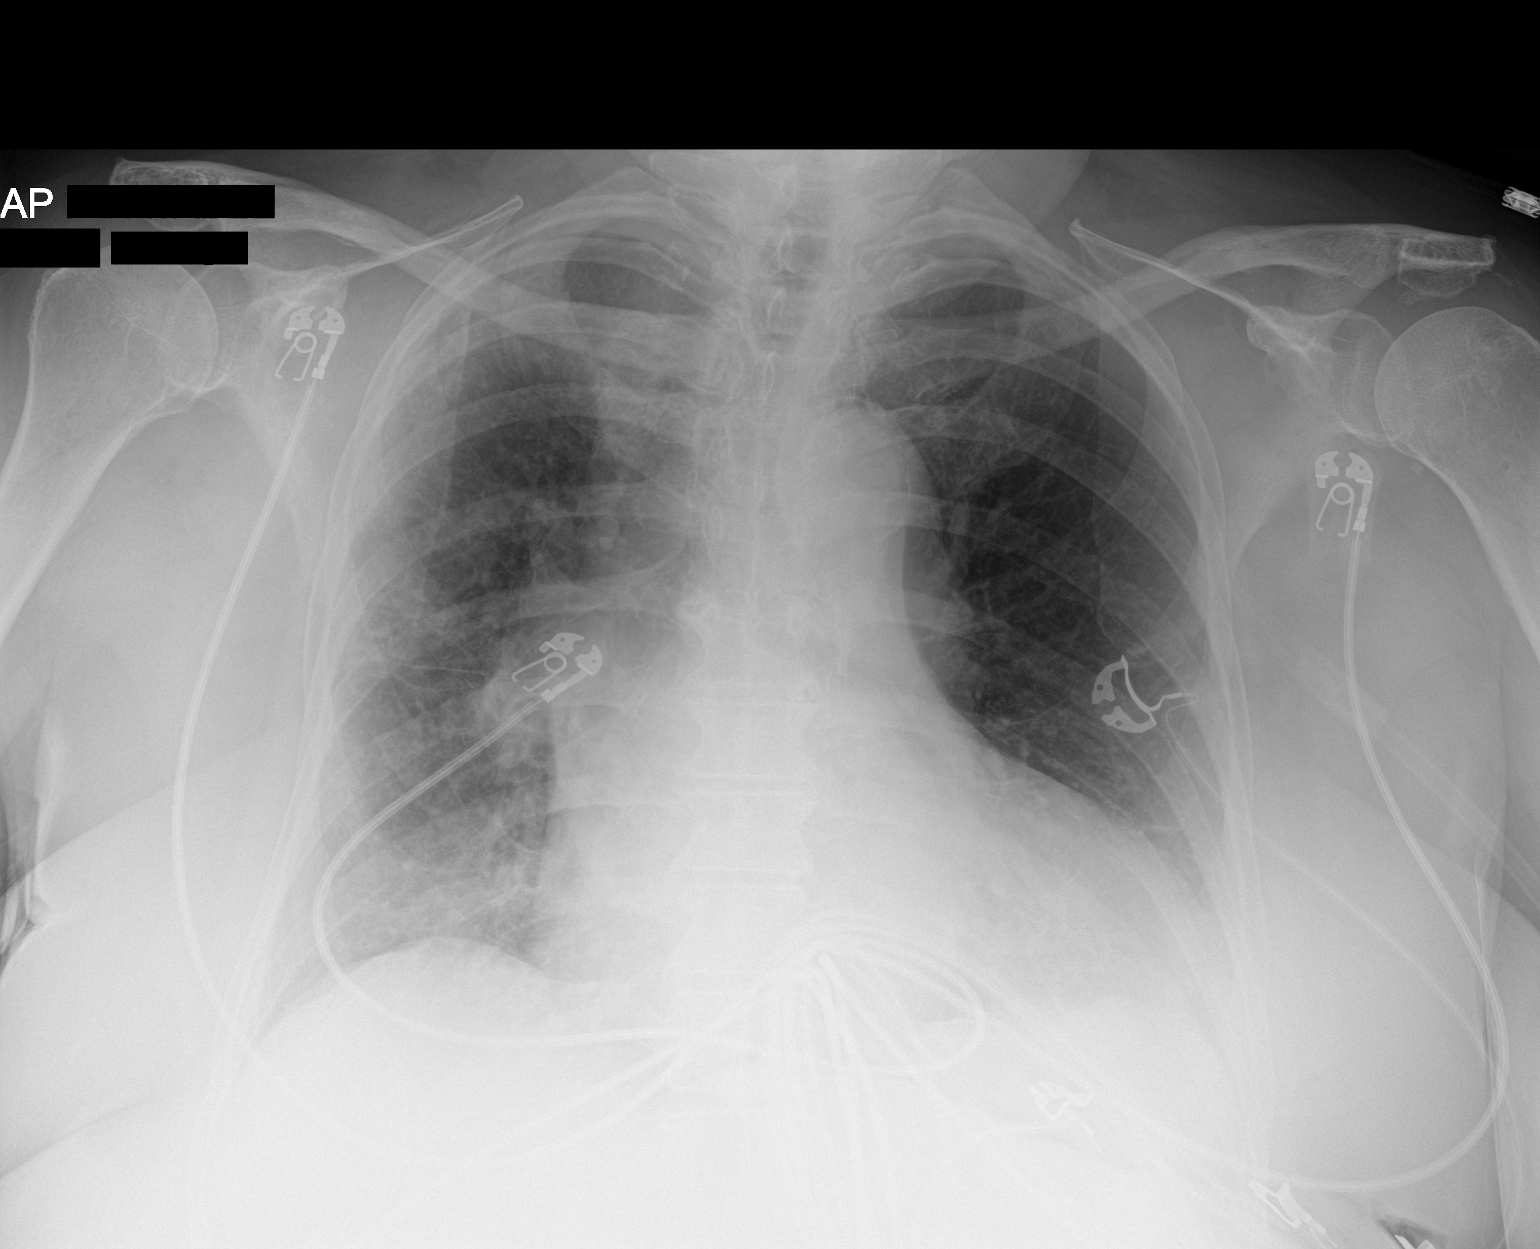

[2 of 2 positions shown; findings below may reference images not displayed]

FINDINGS: Subtle hazy infiltrates in the peripheral right upper, mid and lower
lung. No left lung infiltrate is evident. No evidence of pulmonary
edema.

No pleural effusion or pneumothorax.

Cardiac silhouette is mildly enlarged. No mediastinal or hilar
masses.

Skeletal structures are grossly intact.
IMPRESSION: 1. Subtle infiltrates are noted in the right lung suspicious for
[PZ] pneumonia. Left lung is clear.

## 2019-10-25 MED ORDER — METHYLPREDNISOLONE SODIUM SUCC 125 MG IJ SOLR
125.0000 mg | Freq: Once | INTRAMUSCULAR | Status: AC
  Administered 2019-10-25: 125 mg via INTRAVENOUS
  Filled 2019-10-25: qty 2

## 2019-10-25 MED ORDER — METHYLPREDNISOLONE SODIUM SUCC 125 MG IJ SOLR
50.0000 mg | Freq: Two times a day (BID) | INTRAMUSCULAR | Status: DC
  Administered 2019-10-26 – 2019-10-30 (×9): 50 mg via INTRAVENOUS
  Filled 2019-10-25 (×10): qty 2

## 2019-10-25 MED ORDER — ACETAMINOPHEN 650 MG RE SUPP: 650.0000 mg | Freq: Four times a day (QID) | RECTAL | Status: DC | PRN

## 2019-10-25 MED ORDER — GABAPENTIN 300 MG PO CAPS
300.0000 mg | ORAL_CAPSULE | Freq: Every day | ORAL | Status: DC
  Administered 2019-10-25 – 2019-10-26 (×2): 300 mg via ORAL
  Filled 2019-10-25 (×2): qty 1

## 2019-10-25 MED ORDER — ACETAMINOPHEN 325 MG PO TABS: 650.0000 mg | ORAL_TABLET | Freq: Four times a day (QID) | ORAL | Status: DC | PRN

## 2019-10-25 MED ORDER — GUAIFENESIN-DM 100-10 MG/5ML PO SYRP
10.0000 mL | ORAL_SOLUTION | ORAL | Status: DC | PRN
  Administered 2019-10-25 – 2019-10-29 (×4): 10 mL via ORAL
  Filled 2019-10-25 (×4): qty 10

## 2019-10-25 MED ORDER — PHENOL 1.4 % MT LIQD
1.0000 | OROMUCOSAL | Status: DC | PRN
  Filled 2019-10-25: qty 177

## 2019-10-25 MED ORDER — PANTOPRAZOLE SODIUM 40 MG PO TBEC
40.0000 mg | DELAYED_RELEASE_TABLET | Freq: Every day | ORAL | Status: DC
  Administered 2019-10-25 – 2019-10-30 (×6): 40 mg via ORAL
  Filled 2019-10-25 (×6): qty 1

## 2019-10-25 MED ORDER — POTASSIUM CHLORIDE CRYS ER 20 MEQ PO TBCR
20.0000 meq | EXTENDED_RELEASE_TABLET | Freq: Two times a day (BID) | ORAL | Status: DC
  Administered 2019-10-25: 20 meq via ORAL
  Filled 2019-10-25: qty 1

## 2019-10-25 MED ORDER — PROCHLORPERAZINE EDISYLATE 10 MG/2ML IJ SOLN
5.0000 mg | INTRAMUSCULAR | Status: DC | PRN
  Administered 2019-10-25: 5 mg via INTRAVENOUS
  Filled 2019-10-25: qty 2

## 2019-10-25 MED ORDER — HYDROCOD POLST-CPM POLST ER 10-8 MG/5ML PO SUER: 5.0000 mL | Freq: Two times a day (BID) | ORAL | Status: DC | PRN

## 2019-10-25 MED ORDER — DOXYCYCLINE HYCLATE 100 MG PO TABS
100.0000 mg | ORAL_TABLET | Freq: Once | ORAL | Status: AC
  Administered 2019-10-25: 100 mg via ORAL
  Filled 2019-10-25: qty 1

## 2019-10-25 MED ORDER — ENSURE ENLIVE PO LIQD
237.0000 mL | Freq: Two times a day (BID) | ORAL | Status: DC
  Administered 2019-10-26 – 2019-10-30 (×2): 237 mL via ORAL

## 2019-10-25 MED ORDER — INSULIN ASPART 100 UNIT/ML ~~LOC~~ SOLN
0.0000 [IU] | SUBCUTANEOUS | Status: DC
  Administered 2019-10-25 (×2): 4 [IU] via SUBCUTANEOUS
  Administered 2019-10-26: 7 [IU] via SUBCUTANEOUS
  Administered 2019-10-26 (×2): 4 [IU] via SUBCUTANEOUS
  Filled 2019-10-25: qty 0.2

## 2019-10-25 MED ORDER — IPRATROPIUM-ALBUTEROL 20-100 MCG/ACT IN AERS
2.0000 | INHALATION_SPRAY | Freq: Three times a day (TID) | RESPIRATORY_TRACT | Status: DC
  Administered 2019-10-26 – 2019-10-30 (×14): 2 via RESPIRATORY_TRACT
  Filled 2019-10-25: qty 4

## 2019-10-25 MED ORDER — ORAL CARE MOUTH RINSE
15.0000 mL | Freq: Two times a day (BID) | OROMUCOSAL | Status: DC
  Administered 2019-10-25 – 2019-10-30 (×8): 15 mL via OROMUCOSAL

## 2019-10-25 MED ORDER — ZINC SULFATE 220 (50 ZN) MG PO CAPS
220.0000 mg | ORAL_CAPSULE | Freq: Every day | ORAL | Status: DC
  Administered 2019-10-25 – 2019-10-30 (×6): 220 mg via ORAL
  Filled 2019-10-25 (×6): qty 1

## 2019-10-25 MED ORDER — MAGNESIUM SULFATE 2 GM/50ML IV SOLN
2.0000 g | Freq: Once | INTRAVENOUS | Status: AC
  Administered 2019-10-25: 2 g via INTRAVENOUS
  Filled 2019-10-25: qty 50

## 2019-10-25 MED ORDER — IPRATROPIUM-ALBUTEROL 20-100 MCG/ACT IN AERS
1.0000 | INHALATION_SPRAY | Freq: Four times a day (QID) | RESPIRATORY_TRACT | Status: DC | PRN
  Filled 2019-10-25: qty 4

## 2019-10-25 MED ORDER — IPRATROPIUM-ALBUTEROL 20-100 MCG/ACT IN AERS
2.0000 | INHALATION_SPRAY | Freq: Four times a day (QID) | RESPIRATORY_TRACT | Status: DC
  Administered 2019-10-25: 2 via RESPIRATORY_TRACT
  Filled 2019-10-25: qty 4

## 2019-10-25 MED ORDER — ASCORBIC ACID 500 MG PO TABS
500.0000 mg | ORAL_TABLET | Freq: Every day | ORAL | Status: DC
  Administered 2019-10-25 – 2019-10-30 (×6): 500 mg via ORAL
  Filled 2019-10-25 (×6): qty 1

## 2019-10-25 MED ORDER — ALPRAZOLAM 0.25 MG PO TABS
0.2500 mg | ORAL_TABLET | Freq: Every evening | ORAL | Status: AC | PRN
  Administered 2019-10-25: 0.25 mg via ORAL
  Filled 2019-10-25: qty 1

## 2019-10-25 MED ORDER — SODIUM CHLORIDE 0.9 % IV SOLN
INTRAVENOUS | Status: DC | PRN
  Administered 2019-10-25: 250 mL via INTRAVENOUS

## 2019-10-25 MED ORDER — SEMAGLUTIDE(0.25 OR 0.5MG/DOS) 2 MG/1.5ML ~~LOC~~ SOPN: 0.5000 mg | PEN_INJECTOR | SUBCUTANEOUS | Status: DC

## 2019-10-25 MED ORDER — VITAMIN D3 25 MCG (1000 UNIT) PO TABS
2000.0000 [IU] | ORAL_TABLET | Freq: Every day | ORAL | Status: DC
  Administered 2019-10-25 – 2019-10-30 (×6): 2000 [IU] via ORAL
  Filled 2019-10-25 (×6): qty 2

## 2019-10-25 NOTE — H&P (Signed)
History and Physical    Kathleen Wiley [id number] DOB: 23-Jun-1946 DOA: 10/25/2019  PCP: Jonathon Jordan, MD   Patient coming from: Home.  I have personally briefly reviewed patient's old medical records in [place]  Chief Complaint: Shortness of breath.  HPI: Kathleen Wiley is a 73 y.o. female with medical history significant of anxiety, osteoarthritis, history of cellulitis, history of atrial fibrillation due to Flagyl, GERD, heart murmur, history endometriosis, hyper lipidemia, hypertension, prediabetes, history of rectal vaginal fistula who is coming to the emergency department due to progressively worse shortness of breath since last week.  She got exposed to Covid from her husband who was exposed at work.  She has low-grade fevers, chills, runny nose, dry cough and mild pleuritic chest pain on occasion, but denies chest pain, palpitations, dizziness, PND orthopnea.  She occasionally gets lower extremity edema.  She also mentions having multiple episodes of diarrhea in the past 2 days and had multiple episodes of emesis this morning.  She denies melena or hematochezia.  No dysuria, frequency hematuria.  Denies polyuria, polydipsia, polyphagia or blurred vision.  ED Course: Initial vital signs temperature 98.5 F, pulse 75, respiration 22, blood pressure 120/73 mmHg and O2 sat 87% on room air.  On oxygen 2 L via nasal cannula her O2 sat has being in the high 90s.  She received doxycycline 100 mg p.o. x1 in the ED.  CBC showed a white count of 2.5 with 68% neutrophils, 22% lymphocytes.  Hemoglobin 13.2 g/dL and platelets 89.  Fibrinogen was 520, D-dimer 0.52, LDH 283, CRP 4.5 and ferritin 481.  Triglycerides 168, procalcitonin was less than 10 ng/mL.  Lactic acid and troponin level x2 were normal.  Sodium 137, potassium 3.4, chloride 96 and CO2 26 mmol/L.  BUN 20, creatinine 0.95, glucose 153 and calcium 8.5 mg/dL.  Total protein 7.0 and albumin 3.5 g/dL.  AST was 89 and ALT 75  units/L.  Total bilirubin was 2.9 mg/dL.  SARS antigen was positive.  Chest radiograph shows subtle infiltrates in the right lung suspicious for COVID-19 pneumonia.  Left lung is clear.  Review of Systems: As per HPI otherwise 10 point review of systems negative.   Past Medical History:  Diagnosis Date  . Anxiety   . Arthritis    knee, hip  . Cellulitis 08/2016   left leg  . Complication of anesthesia   . Dysrhythmia    Afib due to Flagyl- 7 -2007- not problems since  . GERD (gastroesophageal reflux disease)   . Heart murmur    "slight "  informed years ago.-   . Hernia, umbilical   . History of atrial fibrillation 2007   with flagyl  . History of endometriosis   . Hypercholesteremia   . Hypertension    has not been to a cardiologist  . PONV (postoperative nausea and vomiting)   . Pre-diabetes   . Rectal vaginal fistula   . Refusal of blood transfusions as patient is Jehovah's Witness     Past Surgical History:  Procedure Laterality Date  . APPENDECTOMY    . BUNIONECTOMY Right 01/09/2019   Procedure: RIGHT FOOT BUNION CORRECTION;  Surgeon: Erle Crocker, MD;  Location: [facility];  Service: Orthopedics;  Laterality: Right;  . EXPLORATORY LAPAROTOMY  1973   for endometrosis  . HAMMERTOE RECONSTRUCTION WITH WEIL OSTEOTOMY Right 01/09/2019   Procedure: RIGHT FOOT 2-4 HAMMERTOE CORRECTION WITH WEIL OSTEOTOMY 2ND METATARSAL, DEEP TENDON TRANSFER WITH AKIN OSTEOTOMY;  Surgeon: Erle Crocker,  MD;  Location: [facility];  Service: Orthopedics;  Laterality: Right;  . I & D EXTREMITY Right 06/10/2016   Procedure: IRRIGATION AND DEBRIDEMENT THUMB FLEXOR SHEATH;  Surgeon: Leanora Cover, MD;  Location: [facility];  Service: Orthopedics;  Laterality: Right;  . JOINT REPLACEMENT     Left Hip  . [place]  . rectovaginal fistula repair  1980  . TOTAL HIP ARTHROPLASTY  05/08/2012   Procedure: TOTAL HIP ARTHROPLASTY;  Surgeon: Kerin Salen, MD;  Location: [facility];  Service: Orthopedics;   Laterality: Left;  . TOTAL HIP ARTHROPLASTY Right 09/18/2018   Procedure: TOTAL HIP ARTHROPLASTY ANTERIOR APPROACH;  Surgeon: Frederik Pear, MD;  Location: [facility];  Service: Orthopedics;  Laterality: Right;  . TOTAL KNEE ARTHROPLASTY Right 01/24/2013   Dr Mayer Camel  . TOTAL KNEE ARTHROPLASTY Right 01/24/2013   Procedure: RIGHT TOTAL KNEE ARTHROPLASTY;  Surgeon: Kerin Salen, MD;  Location: [facility];  Service: Orthopedics;  Laterality: Right;     reports that she quit smoking about 37 years ago. She has a 10.00 pack-year smoking history. She has never used smokeless tobacco. She reports that she does not drink alcohol or use drugs.  Allergies  Allergen Reactions  . Other     Refuses whole blood but does accept albumin  . Metronidazole Other (See Comments)    Chest pain, A-fib   . Septra [Sulfamethoxazole-Trimethoprim] Other (See Comments)    Blisters     Family History  Problem Relation Age of Onset  . Breast cancer Neg Hx    Prior to Admission medications   Medication Sig Start Date End Date Taking? Authorizing Provider  atenolol-chlorthalidone (TENORETIC) 50-25 MG tablet Take 1 tablet by mouth daily. 09/23/19  Yes [provider]  Cholecalciferol (VITAMIN D) 50 MCG (2000 UT) tablet Take 2,000 Units by mouth daily.    Yes [provider]  cyanocobalamin 1000 MCG tablet Take 1,000 mcg by mouth daily.    Yes [provider]  diphenhydrAMINE (BENADRYL) 25 MG tablet Take 25 mg by mouth at bedtime.   Yes [provider]  escitalopram (LEXAPRO) 10 MG tablet Take 10 mg by mouth daily.   Yes [provider]  fluticasone (FLONASE) 50 MCG/ACT nasal spray Place 2 sprays into both nostrils daily as needed for allergies.  07/02/19  Yes [provider]  furosemide (LASIX) 20 MG tablet Take 20 mg by mouth 3 (three) times a week.  09/23/19  Yes [provider]  gabapentin (NEURONTIN) 300 MG capsule Take 300 mg by mouth at bedtime.  07/25/19  Yes  [provider]  gemfibrozil (LOPID) 600 MG tablet Take 600 mg by mouth 2 (two) times daily before a meal.   Yes [provider]  ibuprofen (ADVIL,MOTRIN) 200 MG tablet Take 600 mg by mouth 2 (two) times daily.   Yes [provider]  omeprazole (PRILOSEC) 20 MG capsule Take 20 mg by mouth daily.   Yes [provider]  OZEMPIC, 0.25 OR 0.5 MG/DOSE, 2 MG/1.5ML SOPN Inject 0.5 mg into the skin once a week.  09/20/19  Yes [provider]  Potassium 99 MG TABS Take 99 mg by mouth daily.   Yes [provider]  clotrimazole-betamethasone (LOTRISONE) cream Apply 1 application topically 2 (two) times daily. Patient not taking: Reported on 10/25/2019 08/27/19   Edrick Kins, DPM  terbinafine (LAMISIL) 250 MG tablet Take 1 tablet (250 mg total) by mouth daily. Patient not taking: Reported on 10/25/2019 08/27/19  Edrick Kins, [place]    Physical Exam: Vitals:   10/25/19 1330 10/25/19 1400 10/25/19 1415 10/25/19 1430  BP: 126/72 119/68  128/69  Pulse: 73 70 73 71  Resp: 17 20 10 16   Temp:      TempSrc:      SpO2: 95% 96% 94% 96%  Height:        Constitutional: NAD, calm, comfortable Eyes: PERRL, lids and conjunctivae normal ENMT: Mucous membranes are dry. Posterior pharynx clear of any exudate or lesions. Neck: normal, supple, no masses, no thyromegaly Respiratory: Bibasilar crackles.  No wheezing or rhonchi.  Normal respiratory effort. No accessory muscle use.  Cardiovascular: Regular rate and rhythm, no murmurs / rubs / gallops. No extremity edema. 2+ pedal pulses. No carotid bruits.  Abdomen: Obese, positive umbilical hernia, nondistended.  Soft, mild epigastric tenderness, no masses palpated. No hepatosplenomegaly. Bowel sounds positive.  Musculoskeletal: no clubbing / cyanosis. Good ROM, no contractures. Normal muscle tone.  Skin: Multiple hyperpigmented macules on extremities.  Mild erythema on right lower pretibial area, which the  patient says is improving. Neurologic: CN 2-12 grossly intact. Sensation intact, DTR normal. Strength 5/5 in all 4.  Psychiatric: Normal judgment and insight. Alert and oriented x 3. Normal mood.   Labs on Admission: I have personally reviewed following labs and imaging studies  CBC: Recent Labs  Lab 10/25/19 1230  WBC 2.5*  NEUTROABS 1.7  HGB 13.2  HCT 40.2  MCV 98.5  PLT 89*   Basic Metabolic Panel: Recent Labs  Lab 10/25/19 1230  NA 137  K 3.4*  CL 96*  CO2 26  GLUCOSE 153*  BUN 20  CREATININE 0.95  CALCIUM 8.5*   GFR: CrCl cannot be calculated (Unknown ideal weight.). Liver Function Tests: Recent Labs  Lab 10/25/19 1230  AST 89*  ALT 75*  ALKPHOS 90  BILITOT 2.9*  PROT 7.0  ALBUMIN 3.5   No results for input(s): LIPASE, AMYLASE in the last 168 hours. No results for input(s): AMMONIA in the last 168 hours. Coagulation Profile: No results for input(s): INR, PROTIME in the last 168 hours. Cardiac Enzymes: No results for input(s): CKTOTAL, CKMB, CKMBINDEX, TROPONINI in the last 168 hours. BNP (last 3 results) No results for input(s): PROBNP in the last 8760 hours. HbA1C: No results for input(s): HGBA1C in the last 72 hours. CBG: No results for input(s): GLUCAP in the last 168 hours. Lipid Profile: Recent Labs    10/25/19 1230  TRIG 168*   Thyroid Function Tests: No results for input(s): TSH, T4TOTAL, FREET4, T3FREE, THYROIDAB in the last 72 hours. Anemia Panel: Recent Labs    10/25/19 1230  FERRITIN 481*   Urine analysis:    Component Value Date/Time   COLORURINE YELLOW 09/13/2018 [place] 09/13/2018 1244   LABSPEC 1.010 09/13/2018 1244   PHURINE 6.0 09/13/2018 [place] 09/13/2018 [place] NEGATIVE 09/13/2018 [place] 09/13/2018 [place] 09/13/2018 1244   PROTEINUR NEGATIVE 09/13/2018 1244   UROBILINOGEN 1.0 08/06/2012 2046   NITRITE NEGATIVE 09/13/2018 1244    LEUKOCYTESUR TRACE (A) 09/13/2018 1244    Radiological Exams on Admission: DG Chest Port 1 View  Result Date: 10/25/2019 CLINICAL DATA:  Pt reports tested positive for COVID yesterday. Pt came into today due to increased SHOB, nausea, and diarrhea. Pt hypoxic on arrival at 87%. Hx of HTN and pre-diabetes. EXAM: PORTABLE CHEST 1 VIEW COMPARISON:  01/12/2018. FINDINGS: Subtle hazy infiltrates  in the peripheral right upper, mid and lower lung. No left lung infiltrate is evident. No evidence of pulmonary edema. No pleural effusion or pneumothorax. Cardiac silhouette is mildly enlarged. No mediastinal or hilar masses. Skeletal structures are grossly intact. IMPRESSION: 1. Subtle infiltrates are noted in the right lung suspicious for COVID-19 pneumonia. Left lung is clear. Electronically Signed   By: Lajean Manes M.D.   On: 10/25/2019 12:51    EKG: Independently reviewed. Vent. rate 74 BPM PR interval * ms QRS duration 110 ms QT/QTc 480/533 ms P-R-T axes 43 -23 -72 Sinus rhythm Incomplete RBBB and LAFB Low voltage, precordial leads Abnormal R-wave progression, late transition Nonspecific T abnormalities, lateral leads Prolonged QT interval similar to Mar 2019  Assessment/Plan Principal Problem:   Pneumonia due to COVID-19 virus Admit to telemetry/inpatient. Continue supplemental oxygen. Continue bronchodilators. Continue Solu-Medrol. Does not meet criteria for remdesivir yet. Follow-up CBC, CMP and inflammatory markers.  Active Problems:   Hyperbilirubinemia Has history of cholelithiasis. Will recheck RUQ ultrasound again. Follow-up hepatic function panel in a.m.    Hypokalemia Replacing potassium. Magnesium was supplemented. Follow-up potassium level.    Pre-diabetes Carbohydrate modified diet. Check hemoglobin A1c. CBG monitoring with RI SS.    Prolonged QT Optimize electrolytes. Avoid adding medications that may prolong QT. Recheck EKG in a.m.    GERD  (gastroesophageal reflux disease) On PPI.    Essential hypertension, benign Hold antihypertensives for now. Monitor blood pressure.    Hypercholesterolemia Hold gemfibrozil due to abnormal LFTs. Check RUQ ultrasound.   DVT prophylaxis: SCDs. Code Status: Full code. Family Communication: Disposition Plan: Admit for COVID-19 pneumonia treatment Consults called: Admission status: Inpatient/telemetry.   Reubin Milan MD [facility]  If 7PM-7AM, please contact night-coverage [email or web address]  10/25/2019, 4:13 PM   This document was prepared using Dragon voice recognition software and may contain unintended transcription errors.

## 2019-10-25 NOTE — ED Notes (Signed)
ED TO INPATIENT HANDOFF REPORT  ED Nurse Name and Phone #: Azyah Flett    S Name/Age/Gender Kathleen Wiley 73 y.o. female Room/Bed: WA09/WA09  Code Status   Code Status: Prior  Home/SNF/Other Home Patient oriented to: self, place, time and situation Is this baseline? Yes   Triage Complete: Triage complete  Chief Complaint Pneumonia due to COVID-19 virus [U07.1, J12.89]  Triage Note Pt reports tested positive for COVID yesterday. Pt came into today due to increased SHOB, nausea, and diarrhea. Pt hypoxic on arrival at 87%.    Allergies Allergies  Allergen Reactions  . Other     Refuses whole blood but does accept albumin  . Metronidazole Other (See Comments)    Chest pain, A-fib   . Septra [Sulfamethoxazole-Trimethoprim] Other (See Comments)    Blisters     Level of Care/Admitting Diagnosis ED Disposition    ED Disposition Condition Comment   Admit  Hospital Area: [facility] [[id number]]  Level of Care: Telemetry [5]  Admit to tele based on following criteria: Monitor for Ischemic changes  Covid Evaluation: Confirmed COVID Positive  Diagnosis: Pneumonia due to COVID-19 virus [[id number]]  Admitting Physician: Reubin Milan [[id number]]  Attending Physician: Reubin Milan [[id number]]  Estimated length of stay: past midnight tomorrow  Certification:: I certify this patient will need inpatient services for at least 2 midnights       B Medical/Surgery History Past Medical History:  Diagnosis Date  . Anxiety   . Arthritis    knee, hip  . Cellulitis 08/2016   left leg  . Complication of anesthesia   . Dysrhythmia    Afib due to Flagyl- 7 -2007- not problems since  . GERD (gastroesophageal reflux disease)   . Heart murmur    "slight "  informed years ago.-   . Hernia, umbilical   . History of atrial fibrillation 2007   with flagyl  . History of endometriosis   . Hypercholesteremia   . Hypertension    has not been to a cardiologist   . PONV (postoperative nausea and vomiting)   . Pre-diabetes   . Rectal vaginal fistula   . Refusal of blood transfusions as patient is Jehov[employer]'s Witness    Past Surgical History:  Procedure Laterality Date  . APPENDECTOMY    . BUNIONECTOMY Right 01/09/2019   Procedure: RIGHT FOOT BUNION CORRECTION;  Surgeon: Erle Crocker, MD;  Location: [facility];  Service: Orthopedics;  Laterality: Right;  . EXPLORATORY LAPAROTOMY  1973   for endometrosis  . HAMMERTOE RECONSTRUCTION WITH WEIL OSTEOTOMY Right 01/09/2019   Procedure: RIGHT FOOT 2-4 HAMMERTOE CORRECTION WITH WEIL OSTEOTOMY 2ND METATARSAL, DEEP TENDON TRANSFER WITH AKIN OSTEOTOMY;  Surgeon: Erle Crocker, MD;  Location: [facility];  Service: Orthopedics;  Laterality: Right;  . I & D EXTREMITY Right 06/10/2016   Procedure: IRRIGATION AND DEBRIDEMENT THUMB FLEXOR SHEATH;  Surgeon: Leanora Cover, MD;  Location: [facility];  Service: Orthopedics;  Laterality: Right;  . JOINT REPLACEMENT     Left Hip  . [place]  . rectovaginal fistula repair  1980  . TOTAL HIP ARTHROPLASTY  05/08/2012   Procedure: TOTAL HIP ARTHROPLASTY;  Surgeon: Kerin Salen, MD;  Location: [facility];  Service: Orthopedics;  Laterality: Left;  . TOTAL HIP ARTHROPLASTY Right 09/18/2018   Procedure: TOTAL HIP ARTHROPLASTY ANTERIOR APPROACH;  Surgeon: Frederik Pear, MD;  Location: [facility];  Service: Orthopedics;  Laterality: Right;  . TOTAL KNEE ARTHROPLASTY Right 01/24/2013  Dr Mayer Camel  . TOTAL KNEE ARTHROPLASTY Right 01/24/2013   Procedure: RIGHT TOTAL KNEE ARTHROPLASTY;  Surgeon: Kerin Salen, MD;  Location: [facility];  Service: Orthopedics;  Laterality: Right;     A IV Location/Drains/Wounds Patient Lines/Drains/Airways Status   Active Line/Drains/Airways    Name:   Placement date:   Placement time:   Site:   Days:   Peripheral IV 10/25/19 Right Antecubital   10/25/19    1228    Antecubital   less than 1   Open Drain 1 Right Other (Comment) 5 Fr.   06/10/16    2219     Other (Comment)   1232   Incision (Closed) 09/18/18 Hip Right   09/18/18    1330     402   Incision (Closed) 01/09/19 Leg Right   01/09/19    1031     289          Intake/Output Last 24 hours No intake or output data in the 24 hours ending 10/25/19 1610  Labs/Imaging Results for orders placed or performed during the hospital encounter of 10/25/19 (from the past 48 hour(s))  Lactic acid, plasma     Status: None   Collection Time: 10/25/19 12:30 PM  Result Value Ref Range   Lactic Acid, Venous 1.4 0.5 - 1.9 mmol/L    Comment: Performed at [facility], [place] [address], [place], [place] [address]  CBC WITH DIFFERENTIAL     Status: Abnormal   Collection Time: 10/25/19 12:30 PM  Result Value Ref Range   WBC 2.5 (L) 4.0 - 10.5 K/uL   RBC 4.08 3.87 - 5.11 MIL/uL   Hemoglobin 13.2 12.0 - 15.0 g/dL   HCT 40.2 36.0 - 46.0 %   MCV 98.5 80.0 - 100.0 fL   MCH 32.4 26.0 - 34.0 pg   MCHC 32.8 30.0 - 36.0 g/dL   RDW 14.6 11.5 - 15.5 %   Platelets 89 (L) 150 - 400 K/uL    Comment: REPEATED TO VERIFY PLATELET COUNT CONFIRMED BY SMEAR SPECIMEN CHECKED FOR CLOTS Immature Platelet Fraction may be clinically indicated, consider ordering this additional test [id number]    nRBC 0.0 0.0 - 0.2 %   Neutrophils Relative % 68 %   Neutro Abs 1.7 1.7 - 7.7 K/uL   Lymphocytes Relative 22 %   Lymphs Abs 0.6 (L) 0.7 - 4.0 K/uL   Monocytes Relative 7 %   Monocytes Absolute 0.2 0.1 - 1.0 K/uL   Eosinophils Relative 0 %   Eosinophils Absolute 0.0 0.0 - 0.5 K/uL   Basophils Relative 1 %   Basophils Absolute 0.0 0.0 - 0.1 K/uL   Immature Granulocytes 2 %   Abs Immature Granulocytes 0.05 0.00 - 0.07 K/uL    Comment: Performed at [facility], [place] [address] [place], [place] [address]  Comprehensive metabolic panel     Status: Abnormal   Collection Time: 10/25/19 12:30 PM  Result Value Ref Range   Sodium 137 135 - 145 mmol/L   Potassium 3.4 (L) 3.5 - 5.1 mmol/L    Chloride 96 (L) 98 - 111 mmol/L   CO2 26 22 - 32 mmol/L   Glucose, Bld 153 (H) 70 - 99 mg/dL   BUN 20 8 - 23 mg/dL   Creatinine, Ser 0.95 0.44 - 1.00 mg/dL   Calcium 8.5 (L) 8.9 - 10.3 mg/dL   Total Protein 7.0 6.5 - 8.1 g/dL   Albumin 3.5 3.5 - 5.0 g/dL  AST 89 (H) 15 - 41 U/L   ALT 75 (H) 0 - 44 U/L   Alkaline Phosphatase 90 38 - 126 U/L   Total Bilirubin 2.9 (H) 0.3 - 1.2 mg/dL   GFR calc non Af Amer 60 (L) >60 mL/min   GFR calc Af Amer >60 >60 mL/min   Anion gap 15 5 - 15    Comment: Performed at [facility], [place] [address] [place], [place] [address]  D-dimer, quantitative     Status: Abnormal   Collection Time: 10/25/19 12:30 PM  Result Value Ref Range   D-Dimer, Quant 0.52 (H) 0.00 - 0.50 ug/mL-FEU    Comment: (NOTE) At the manufacturer cut-off of 0.50 ug/mL FEU, this assay has been documented to exclude PE with a sensitivity and negative predictive value of 97 to 99%.  At this time, this assay has not been approved by the FDA to exclude DVT/VTE. Results should be correlated with clinical presentation. Performed at [facility], [place] [address], [place], [place] [address]   Procalcitonin     Status: None   Collection Time: 10/25/19 12:30 PM  Result Value Ref Range   Procalcitonin <0.10 ng/mL    Comment:        Interpretation: PCT (Procalcitonin) <= 0.5 ng/mL: Systemic infection (sepsis) is not likely. Local bacterial infection is possible. (NOTE)       Sepsis PCT Algorithm           Lower Respiratory Tract                                      Infection PCT Algorithm    ----------------------------     ----------------------------         PCT < 0.25 ng/mL                PCT < 0.10 ng/mL         Strongly encourage             Strongly discourage   discontinuation of antibiotics    initiation of antibiotics    ----------------------------     -----------------------------       PCT 0.25 - 0.50 ng/mL            PCT 0.10 - 0.25  ng/mL               OR       >80% decrease in PCT            Discourage initiation of                                            antibiotics      Encourage discontinuation           of antibiotics    ----------------------------     -----------------------------         PCT >= 0.50 ng/mL              PCT 0.26 - 0.50 ng/mL               AND        <80% decrease in PCT             Encourage initiation of  antibiotics       Encourage continuation           of antibiotics    ----------------------------     -----------------------------        PCT >= 0.50 ng/mL                  PCT > 0.50 ng/mL               AND         increase in PCT                  Strongly encourage                                      initiation of antibiotics    Strongly encourage escalation           of antibiotics                                     -----------------------------                                           PCT <= 0.25 ng/mL                                                 OR                                        > 80% decrease in PCT                                     Discontinue / Do not initiate                                             antibiotics Performed at [place] [address] [place], [place] [address]   Lactate dehydrogenase     Status: Abnormal   Collection Time: 10/25/19 12:30 PM  Result Value Ref Range   LDH 283 (H) 98 - 192 U/L    Comment: Performed at [facility], [place] [address], [place], [place] [address]  Ferritin     Status: Abnormal   Collection Time: 10/25/19 12:30 PM  Result Value Ref Range   Ferritin 481 (H) 11 - 307 ng/mL    Comment: Performed at [facility], [place] [address] [place], [place] [address]  Triglycerides     Status: Abnormal   Collection Time: 10/25/19 12:30 PM  Result Value Ref Range   Triglycerides 168 (H) <150 mg/dL    Comment: Performed at  [facility], [place] [address], [place], [place] [address]  Fibrinogen     Status: Abnormal   Collection Time: 10/25/19 12:30 PM  Result Value Ref Range  Fibrinogen 520 (H) 210 - 475 mg/dL    Comment: Performed at [facility], [place] [address] [place], [place] [address]  C-reactive protein     Status: Abnormal   Collection Time: 10/25/19 12:30 PM  Result Value Ref Range   CRP 4.5 (H) <1.0 mg/dL    Comment: Performed at [facility], [place] [address], [place], [place] [address]  Troponin I (High Sensitivity)     Status: None   Collection Time: 10/25/19 12:30 PM  Result Value Ref Range   Troponin I (High Sensitivity) 6 <18 ng/L    Comment: (NOTE) Elevated high sensitivity troponin I (hsTnI) values and significant  changes across serial measurements may suggest ACS but many other  chronic and acute conditions are known to elevate hsTnI results.  Refer to the "Links" section for chest pain algorithms and additional  guidance. Performed at [facility], [place] [address], [place], [place] [address]   Troponin I (High Sensitivity)     Status: None   Collection Time: 10/25/19 12:30 PM  Result Value Ref Range   Troponin I (High Sensitivity) 5 <18 ng/L    Comment: (NOTE) Elevated high sensitivity troponin I (hsTnI) values and significant  changes across serial measurements may suggest ACS but many other  chronic and acute conditions are known to elevate hsTnI results.  Refer to the "Links" section for chest pain algorithms and additional  guidance. Performed at [facility], [place] [address], [place], [place] [address]   POC SARS Coronavirus 2 Ag-ED - Nasal Swab (BD Veritor Kit)     Status: Abnormal   Collection Time: 10/25/19  2:13 PM  Result Value Ref Range   SARS Coronavirus 2 Ag POSITIVE (A) NEGATIVE    Comment: (NOTE) SARS-CoV-2 antigen PRESENT. Positive results indicate the presence of viral  antigens, but clinical correlation with patient history and other diagnostic information is necessary to determine patient infection status.  Positive results do not rule out bacterial infection or co-infection  with other viruses. False positive results are rare but can occur, and confirmatory RT-PCR testing may be appropriate in some circumstances. The expected result is Negative. Fact Sheet for Patients: PodPark.tn Fact Sheet for Providers: GiftContent.is  This test is not yet approved or cleared by the [place] FDA and  has been authorized for detection and/or diagnosis of SARS-CoV-2 by FDA under an Emergency Use Authorization (EUA).  This EUA will remain in effect (meaning this test can be used) for the duration of  the COVID-19 declaration under Section 564(b)(1) of the Act, 21 U.S.C. section 360bbb-3(b)(1), unless the a uthorization is terminated or revoked sooner.    DG Chest Port 1 View  Result Date: 10/25/2019 CLINICAL DATA:  Pt reports tested positive for COVID yesterday. Pt came into today due to increased SHOB, nausea, and diarrhea. Pt hypoxic on arrival at 87%. Hx of HTN and pre-diabetes. EXAM: PORTABLE CHEST 1 VIEW COMPARISON:  01/12/2018. FINDINGS: Subtle hazy infiltrates in the peripheral right upper, mid and lower lung. No left lung infiltrate is evident. No evidence of pulmonary edema. No pleural effusion or pneumothorax. Cardiac silhouette is mildly enlarged. No mediastinal or hilar masses. Skeletal structures are grossly intact. IMPRESSION: 1. Subtle infiltrates are noted in the right lung suspicious for COVID-19 pneumonia. Left lung is clear. Electronically Signed   By: Lajean Manes M.D.   On: 10/25/2019 12:51    Pending Labs [employer] (From admission, onward)    Start  Ordered   10/25/19 1200  Blood Culture (routine x 2)  BLOOD CULTURE X 2,   STAT     10/25/19 1200           Vitals/Pain Today's Vitals   10/25/19 1330 10/25/19 1400 10/25/19 1415 10/25/19 1430  BP: 126/72 119/68  128/69  Pulse: 73 70 73 71  Resp: '17 20 10 16  ' Temp:      TempSrc:      SpO2: 95% 96% 94% 96%  Height:      PainSc:        Isolation Precautions Airborne and Contact precautions  Medications Medications  doxycycline (VIBRA-TABS) tablet 100 mg (100 mg Oral Given 10/25/19 1352)    Mobility walks Moderate fall risk   Focused Assessments Cardiac Assessment Handoff:    Lab Results  Component Value Date   TROPONINI <0.30 08/06/2012   Lab Results  Component Value Date   DDIMER 0.52 (H) 10/25/2019   Does the Patient currently have chest pain? No  , Pulmonary Assessment Handoff:  Lung sounds:   O2 Device: Nasal Cannula O2 Flow Rate (L/min): 2 L/min      R Recommendations: See Admitting Provider Note  Report given to:   Additional Notes:

## 2019-10-25 NOTE — ED Notes (Signed)
Pure wick has been placed. Suction set to 45mmHg.  

## 2019-10-25 NOTE — ED Provider Notes (Signed)
[facility] DEPT Provider Note   CSN: [id number] Arrival date & time: 10/25/19  1126     History Chief Complaint  Patient presents with  . COVID  . Shortness of Breath  . Nausea  . Diarrhea    Kathleen Wiley is a 73 y.o. female.  HPI  73 year old female presents with Covid.  Patient has had symptoms for about 4 or 5 days.  She has had some cough, shortness of breath and recently some diarrhea.  Has had pleuritic chest pain for the last 2 or 3 days.  No vomiting but has had some nausea.  Low-grade temperatures but no fevers.  Covid test resulted yesterday and her doctor told her to come to the ER today.  She was found to be hypoxic down to 87% with increased work of breathing.  This improved when placed on 2 L.  Does not wear oxygen at home.  She also has had a chronic cyst inferior to her right breast but noting today that it was red and a little painful.  Past Medical History:  Diagnosis Date  . Anxiety   . Arthritis    knee, hip  . Cellulitis 08/2016   left leg  . Complication of anesthesia   . Dysrhythmia    Afib due to Flagyl- 7 -2007- not problems since  . GERD (gastroesophageal reflux disease)   . Heart murmur    "slight "  informed years ago.-   . Hernia, umbilical   . History of atrial fibrillation 2007   with flagyl  . History of endometriosis   . Hypercholesteremia   . Hypertension    has not been to a cardiologist  . PONV (postoperative nausea and vomiting)   . Pre-diabetes   . Rectal vaginal fistula   . Refusal of blood transfusions as patient is Jehovah's Witness     Patient Active Problem List   Diagnosis Date Noted  . Pneumonia due to COVID-19 virus 10/25/2019  . Hypokalemia 10/25/2019  . Pre-diabetes   . History of total hip arthroplasty, right 09/18/2018  . Osteoarthritis of right hip 09/14/2018  . Morbid obesity, BMI not known ([place]) 09/17/2016  . Stasis ulcer ([place]) 09/17/2016  . 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 Cellulitis of left lower leg 08/10/2016  . Infection of flexor tendon sheath 06/14/2016  . Pain of right thumb 06/14/2016  . GERD (gastroesophageal reflux disease) 02/09/2013  . Essential hypertension, benign 02/09/2013  . Hypercholesterolemia 02/09/2013  . Unspecified constipation 02/09/2013  . Muscle spasm 02/09/2013  . Osteoarthritis of right knee 01/26/2013  . Osteoarthritis of left hip 05/10/2012    Past Surgical History:  Procedure Laterality Date  . APPENDECTOMY    . BUNIONECTOMY Right 01/09/2019   Procedure: RIGHT FOOT BUNION CORRECTION;  Surgeon: Erle Crocker, MD;  Location: [facility];  Service: Orthopedics;  Laterality: Right;  . EXPLORATORY LAPAROTOMY  1973   for endometrosis  . HAMMERTOE RECONSTRUCTION WITH WEIL OSTEOTOMY Right 01/09/2019   Procedure: RIGHT FOOT 2-4 HAMMERTOE CORRECTION WITH WEIL OSTEOTOMY 2ND METATARSAL, DEEP TENDON TRANSFER WITH AKIN OSTEOTOMY;  Surgeon: Erle Crocker, MD;  Location: [facility];  Service: Orthopedics;  Laterality: Right;  . I & D EXTREMITY Right 06/10/2016   Procedure: IRRIGATION AND DEBRIDEMENT THUMB FLEXOR SHEATH;  Surgeon: Leanora Cover, MD;  Location: [facility];  Service: Orthopedics;  Laterality: Right;  . JOINT REPLACEMENT     Left Hip  .  [place]  . rectovaginal fistula repair  1980  . TOTAL HIP ARTHROPLASTY  05/08/2012   Procedure: TOTAL HIP ARTHROPLASTY;  Surgeon: Kerin Salen, MD;  Location: [facility];  Service: Orthopedics;  Laterality: Left;  . TOTAL HIP ARTHROPLASTY Right 09/18/2018   Procedure: TOTAL HIP ARTHROPLASTY ANTERIOR APPROACH;  Surgeon: Frederik Pear, MD;  Location: [facility];  Service: Orthopedics;  Laterality: Right;  . TOTAL KNEE ARTHROPLASTY Right 01/24/2013   Dr Mayer Camel  . TOTAL KNEE ARTHROPLASTY Right 01/24/2013   Procedure: RIGHT TOTAL KNEE ARTHROPLASTY;  Surgeon: Kerin Salen, MD;  Location: [facility];  Service: Orthopedics;  Laterality: Right;      OB History   No obstetric history on file.     Family History  Problem Relation Age of Onset  . Breast cancer Neg Hx     Social History   Tobacco Use  . Smoking status: Former Smoker    Packs/day: 1.00    Years: 10.00    Pack years: 10.00    Quit date: 11/08/1981    Years since quitting: 37.9  . Smokeless tobacco: Never Used  Substance Use Topics  . Alcohol use: No  . Drug use: No    Home Medications Prior to Admission medications   Medication Sig Start Date End Date Taking? Authorizing Provider  atenolol-chlorthalidone (TENORETIC) 50-25 MG tablet Take 1 tablet by mouth daily. 09/23/19  Yes [provider]  Cholecalciferol (VITAMIN D) 50 MCG (2000 UT) tablet Take 2,000 Units by mouth daily.    Yes [provider]  cyanocobalamin 1000 MCG tablet Take 1,000 mcg by mouth daily.    Yes [provider]  diphenhydrAMINE (BENADRYL) 25 MG tablet Take 25 mg by mouth at bedtime.   Yes [provider]  escitalopram (LEXAPRO) 10 MG tablet Take 10 mg by mouth daily.   Yes [provider]  fluticasone (FLONASE) 50 MCG/ACT nasal spray Place 2 sprays into both nostrils daily as needed for allergies.  07/02/19  Yes [provider]  furosemide (LASIX) 20 MG tablet Take 20 mg by mouth 3 (three) times a week.  09/23/19  Yes [provider]  gabapentin (NEURONTIN) 300 MG capsule Take 300 mg by mouth at bedtime.  07/25/19  Yes [provider]  gemfibrozil (LOPID) 600 MG tablet Take 600 mg by mouth 2 (two) times daily before a meal.   Yes [provider]  ibuprofen (ADVIL,MOTRIN) 200 MG tablet Take 600 mg by mouth 2 (two) times daily.   Yes [provider]  omeprazole (PRILOSEC) 20 MG capsule Take 20 mg by mouth daily.   Yes [provider]  OZEMPIC, 0.25 OR 0.5 MG/DOSE, 2 MG/1.5ML SOPN Inject 0.5 mg into the skin once a week.  09/20/19  Yes [provider]  Potassium 99 MG TABS Take 99 mg  by mouth daily.   Yes [provider]  clotrimazole-betamethasone (LOTRISONE) cream Apply 1 application topically 2 (two) times daily. Patient not taking: Reported on 10/25/2019 08/27/19   Edrick Kins, DPM  terbinafine (LAMISIL) 250 MG tablet Take 1 tablet (250 mg total) by mouth daily. Patient not taking: Reported on 10/25/2019 08/27/19   Edrick Kins, DPM    Allergies    Other, Metronidazole, and Septra [sulfamethoxazole-trimethoprim]  Review of Systems   Review of Systems  Constitutional: Negative for fever.  Respiratory: Positive for cough and shortness of breath.   Cardiovascular: Positive for chest pain.  Gastrointestinal: Positive for diarrhea and nausea. Negative  for abdominal pain, blood in stool and vomiting.  All other systems reviewed and are negative.   Physical Exam Updated Vital Signs BP 128/69   Pulse 71   Temp 98.5 F (36.9 C) (Oral)   Resp 16   Ht 5\' 6"  (1.676 m)   SpO2 96%   BMI 37.12 kg/m   Physical Exam Vitals and nursing note reviewed.  Constitutional:      Appearance: She is well-developed.  HENT:     Head: Normocephalic and atraumatic.     Right Ear: External ear normal.     Left Ear: External ear normal.     Nose: Nose normal.  Eyes:     General:        Right eye: No discharge.        Left eye: No discharge.  Cardiovascular:     Rate and Rhythm: Normal rate and regular rhythm.  Pulmonary:     Effort: Pulmonary effort is normal.  Chest:    Abdominal:     Palpations: Abdomen is soft.     Tenderness: There is no abdominal tenderness.  Musculoskeletal:     Right lower leg: No edema.     Left lower leg: No edema.  Skin:    General: Skin is warm and dry.  Neurological:     Mental Status: She is alert.  Psychiatric:        Mood and Affect: Mood is not anxious.     ED Results / Procedures / Treatments   Labs (all labs ordered are listed, but only abnormal results are displayed) Labs Reviewed  CBC WITH  DIFFERENTIAL/PLATELET - Abnormal; Notable for the following components:      Result Value   WBC 2.5 (*)    Platelets 89 (*)    Lymphs Abs 0.6 (*)    All other components within normal limits  COMPREHENSIVE METABOLIC PANEL - Abnormal; Notable for the following components:   Potassium 3.4 (*)    Chloride 96 (*)    Glucose, Bld 153 (*)    Calcium 8.5 (*)    AST 89 (*)    ALT 75 (*)    Total Bilirubin 2.9 (*)    GFR calc non Af Amer 60 (*)    All other components within normal limits  D-DIMER, QUANTITATIVE (NOT AT [facility]) - Abnormal; Notable for the following components:   D-Dimer, Quant 0.52 (*)    All other components within normal limits  LACTATE DEHYDROGENASE - Abnormal; Notable for the following components:   LDH 283 (*)    All other components within normal limits  FERRITIN - Abnormal; Notable for the following components:   Ferritin 481 (*)    All other components within normal limits  TRIGLYCERIDES - Abnormal; Notable for the following components:   Triglycerides 168 (*)    All other components within normal limits  FIBRINOGEN - Abnormal; Notable for the following components:   Fibrinogen 520 (*)    All other components within normal limits  C-REACTIVE PROTEIN - Abnormal; Notable for the following components:   CRP 4.5 (*)    All other components within normal limits  POC SARS CORONAVIRUS 2 AG -  ED - Abnormal; Notable for the following components:   SARS Coronavirus 2 Ag POSITIVE (*)    All other components within normal limits  CULTURE, BLOOD (ROUTINE X 2)  CULTURE, BLOOD (ROUTINE X 2)  LACTIC ACID, PLASMA  PROCALCITONIN  MAGNESIUM  PHOSPHORUS  BILIRUBIN, DIRECT  ABO/RH  TROPONIN  I (HIGH SENSITIVITY)  TROPONIN I (HIGH SENSITIVITY)    EKG EKG Interpretation  Date/Time:  Thursday October 25 2019 12:21:14 EST Ventricular Rate:  74 PR Interval:    QRS Duration: 110 QT Interval:  480 QTC Calculation: 533 R Axis:   -23 Text Interpretation: Sinus rhythm  Incomplete RBBB and LAFB Low voltage, precordial leads Abnormal R-wave progression, late transition Nonspecific T abnormalities, lateral leads Prolonged QT interval similar to Mar 2019 Confirmed by Sherwood Gambler [phone]) on 10/25/2019 12:36:58 PM   Radiology DG Chest Port 1 View  Result Date: 10/25/2019 CLINICAL DATA:  Pt reports tested positive for COVID yesterday. Pt came into today due to increased SHOB, nausea, and diarrhea. Pt hypoxic on arrival at 87%. Hx of HTN and pre-diabetes. EXAM: PORTABLE CHEST 1 VIEW COMPARISON:  01/12/2018. FINDINGS: Subtle hazy infiltrates in the peripheral right upper, mid and lower lung. No left lung infiltrate is evident. No evidence of pulmonary edema. No pleural effusion or pneumothorax. Cardiac silhouette is mildly enlarged. No mediastinal or hilar masses. Skeletal structures are grossly intact. IMPRESSION: 1. Subtle infiltrates are noted in the right lung suspicious for COVID-19 pneumonia. Left lung is clear. Electronically Signed   By: Lajean Manes M.D.   On: 10/25/2019 12:51    Procedures Procedures (including critical care time)  Medications Ordered in ED Medications  methylPREDNISolone sodium succinate (SOLU-MEDROL) 125 mg/2 mL injection 125 mg (has no administration in time range)  magnesium sulfate IVPB 2 g 50 mL (has no administration in time range)  ascorbic acid (VITAMIN C) tablet 500 mg (has no administration in time range)  zinc sulfate capsule 220 mg (has no administration in time range)  guaiFENesin-dextromethorphan (ROBITUSSIN DM) 100-10 MG/5ML syrup 10 mL (has no administration in time range)  chlorpheniramine-HYDROcodone (TUSSIONEX) 10-8 MG/5ML suspension 5 mL (has no administration in time range)  Ipratropium-Albuterol (COMBIVENT) respimat 2 puff (has no administration in time range)  methylPREDNISolone sodium succinate (SOLU-MEDROL) injection 0.5 mg/kg (has no administration in time range)  acetaminophen (TYLENOL) tablet 650 mg (has no  administration in time range)    Or  acetaminophen (TYLENOL) suppository 650 mg (has no administration in time range)  prochlorperazine (COMPAZINE) injection 5 mg (has no administration in time range)  insulin aspart (novoLOG) injection 0-20 Units (has no administration in time range)  doxycycline (VIBRA-TABS) tablet 100 mg (100 mg Oral Given 10/25/19 1352)    ED Course  I have reviewed the triage vital signs and the nursing notes.  Pertinent labs & imaging results that were available during my care of the patient were reviewed by me and considered in my medical decision making (see chart for details).    MDM Rules/Calculators/A&P                      Patient's hypoxia is like COVID related. Age adjusted ddimer is negative, so I don't think she needs an emergent CT. feels much better on 2 L oxygen and appears stable.  Will need admission for the new O2 requirement.  Dr. Olevia Bowens to admit.  Kathleen Wiley was evaluated in Emergency Department on 10/25/2019 for the symptoms described in the history of present illness. She was evaluated in the context of the global COVID-19 pandemic, which necessitated consideration that the patient might be at risk for infection with the SARS-CoV-2 virus that causes COVID-19. Institutional protocols and algorithms that pertain to the evaluation of patients at risk for COVID-19 are in a state of rapid change based on information  released by regulatory bodies including the CDC and federal and state organizations. These policies and algorithms were followed during the patient's care in the ED.  Final Clinical Impression(s) / ED Diagnoses Final diagnoses:  Acute respiratory failure with hypoxia ([place])  COVID-19 virus infection    Rx / DC Orders ED Discharge Orders    None       Sherwood Gambler, MD 10/25/19 1635

## 2019-10-25 NOTE — ED Triage Notes (Addendum)
Pt reports tested positive for COVID yesterday. Pt came into today due to increased SHOB, nausea, and diarrhea. Pt hypoxic on arrival at 87%.

## 2019-10-25 NOTE — ED Notes (Signed)
Attempted to call report, awaiting call back.

## 2019-10-26 ENCOUNTER — Inpatient Hospital Stay (HOSPITAL_COMMUNITY): Payer: Medicare Other

## 2019-10-26 DIAGNOSIS — D696 Thrombocytopenia, unspecified: Secondary | ICD-10-CM

## 2019-10-26 DIAGNOSIS — R739 Hyperglycemia, unspecified: Secondary | ICD-10-CM

## 2019-10-26 DIAGNOSIS — E876 Hypokalemia: Secondary | ICD-10-CM

## 2019-10-26 DIAGNOSIS — E871 Hypo-osmolality and hyponatremia: Secondary | ICD-10-CM

## 2019-10-26 DIAGNOSIS — R748 Abnormal levels of other serum enzymes: Secondary | ICD-10-CM

## 2019-10-26 DIAGNOSIS — J1289 Other viral pneumonia: Secondary | ICD-10-CM

## 2019-10-26 DIAGNOSIS — J9601 Acute respiratory failure with hypoxia: Secondary | ICD-10-CM

## 2019-10-26 DIAGNOSIS — D72819 Decreased white blood cell count, unspecified: Secondary | ICD-10-CM

## 2019-10-26 DIAGNOSIS — U071 COVID-19: Principal | ICD-10-CM

## 2019-10-26 DIAGNOSIS — I10 Essential (primary) hypertension: Secondary | ICD-10-CM

## 2019-10-26 DIAGNOSIS — A0839 Other viral enteritis: Secondary | ICD-10-CM

## 2019-10-26 DIAGNOSIS — T380X5A Adverse effect of glucocorticoids and synthetic analogues, initial encounter: Secondary | ICD-10-CM

## 2019-10-26 LAB — CBC WITH DIFFERENTIAL/PLATELET
Abs Immature Granulocytes: 0.02 10*3/uL (ref 0.00–0.07)
Basophils Absolute: 0 10*3/uL (ref 0.0–0.1)
Basophils Relative: 1 %
Eosinophils Absolute: 0 10*3/uL (ref 0.0–0.5)
Eosinophils Relative: 0 %
HCT: 38 % (ref 36.0–46.0)
Hemoglobin: 12.3 g/dL (ref 12.0–15.0)
Immature Granulocytes: 1 %
Lymphocytes Relative: 30 %
Lymphs Abs: 0.5 10*3/uL — ABNORMAL LOW (ref 0.7–4.0)
MCH: 32.2 pg (ref 26.0–34.0)
MCHC: 32.4 g/dL (ref 30.0–36.0)
MCV: 99.5 fL (ref 80.0–100.0)
Monocytes Absolute: 0.1 10*3/uL (ref 0.1–1.0)
Monocytes Relative: 4 %
Neutro Abs: 1.1 10*3/uL — ABNORMAL LOW (ref 1.7–7.7)
Neutrophils Relative %: 64 %
Platelets: 91 10*3/uL — ABNORMAL LOW (ref 150–400)
RBC: 3.82 MIL/uL — ABNORMAL LOW (ref 3.87–5.11)
RDW: 14.2 % (ref 11.5–15.5)
WBC: 1.7 10*3/uL — ABNORMAL LOW (ref 4.0–10.5)
nRBC: 0 % (ref 0.0–0.2)

## 2019-10-26 LAB — GLUCOSE, CAPILLARY
Glucose-Capillary: 191 mg/dL — ABNORMAL HIGH (ref 70–99)
Glucose-Capillary: 167 mg/dL — ABNORMAL HIGH (ref 70–99)
Glucose-Capillary: 247 mg/dL — ABNORMAL HIGH (ref 70–99)
Glucose-Capillary: 205 mg/dL — ABNORMAL HIGH (ref 70–99)
Glucose-Capillary: 198 mg/dL — ABNORMAL HIGH (ref 70–99)
Glucose-Capillary: 199 mg/dL — ABNORMAL HIGH (ref 70–99)

## 2019-10-26 LAB — D-DIMER, QUANTITATIVE: D-Dimer, Quant: 0.5 ug/mL-FEU (ref 0.00–0.50)

## 2019-10-26 LAB — COMPREHENSIVE METABOLIC PANEL
ALT: 67 U/L — ABNORMAL HIGH (ref 0–44)
AST: 83 U/L — ABNORMAL HIGH (ref 15–41)
Albumin: 3.1 g/dL — ABNORMAL LOW (ref 3.5–5.0)
Alkaline Phosphatase: 85 U/L (ref 38–126)
Anion gap: 10 (ref 5–15)
BUN: 18 mg/dL (ref 8–23)
CO2: 24 mmol/L (ref 22–32)
Calcium: 7.9 mg/dL — ABNORMAL LOW (ref 8.9–10.3)
Chloride: 95 mmol/L — ABNORMAL LOW (ref 98–111)
Creatinine, Ser: 0.78 mg/dL (ref 0.44–1.00)
GFR calc Af Amer: >60 mL/min (ref >60)
GFR calc non Af Amer: >60 mL/min (ref >60)
Glucose, Bld: 188 mg/dL — ABNORMAL HIGH (ref 70–99)
Potassium: 3 mmol/L — ABNORMAL LOW (ref 3.5–5.1)
Sodium: 129 mmol/L — ABNORMAL LOW (ref 135–145)
Total Bilirubin: 2.4 mg/dL — ABNORMAL HIGH (ref 0.3–1.2)
Total Protein: 6.6 g/dL (ref 6.5–8.1)

## 2019-10-26 LAB — HEMOGLOBIN A1C
Hgb A1c MFr Bld: 7.2 % — ABNORMAL HIGH (ref 4.8–5.6)
Mean Plasma Glucose: 159.94 mg/dL

## 2019-10-26 LAB — C-REACTIVE PROTEIN: CRP: 4.2 mg/dL — ABNORMAL HIGH (ref <1.0)

## 2019-10-26 LAB — MAGNESIUM: Magnesium: 2.2 mg/dL (ref 1.7–2.4)

## 2019-10-26 LAB — FERRITIN: Ferritin: 498 ng/mL — ABNORMAL HIGH (ref 11–307)

## 2019-10-26 LAB — BILIRUBIN, DIRECT: Bilirubin, Direct: 1.1 mg/dL — ABNORMAL HIGH (ref 0.0–0.2)

## 2019-10-26 LAB — PHOSPHORUS: Phosphorus: 3.2 mg/dL (ref 2.5–4.6)

## 2019-10-26 IMAGING — US US ABDOMEN LIMITED
1 series · 14 of 25 positions shown · non-contrast
Comparison: Ultrasound [DATE].

CLINICAL DATA: Elevated liver function studies. Previous
appendectomy.

EXAM:
ULTRASOUND ABDOMEN LIMITED RIGHT UPPER QUADRANT

[Series 1: us abdomen limited · 14 of 101 slices shown]
[im 1/101]
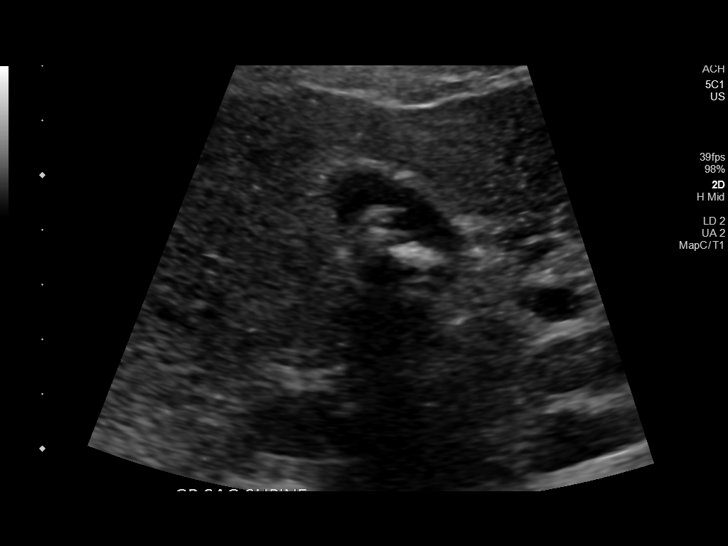
[im 9/101]
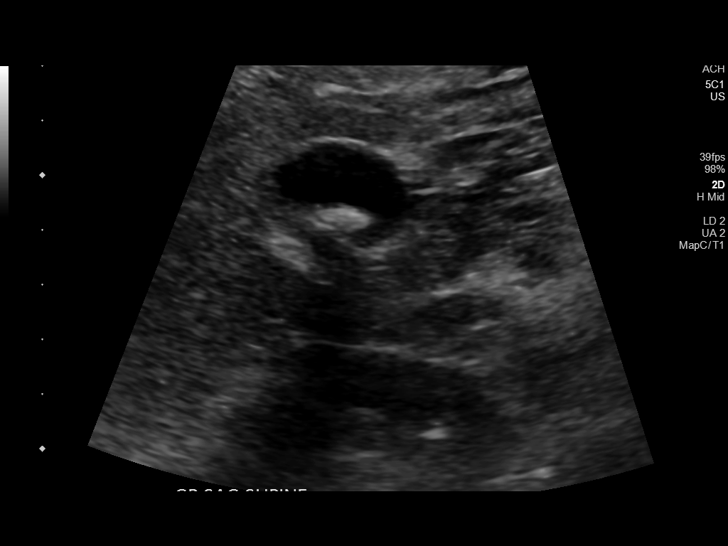
[im 17/101]
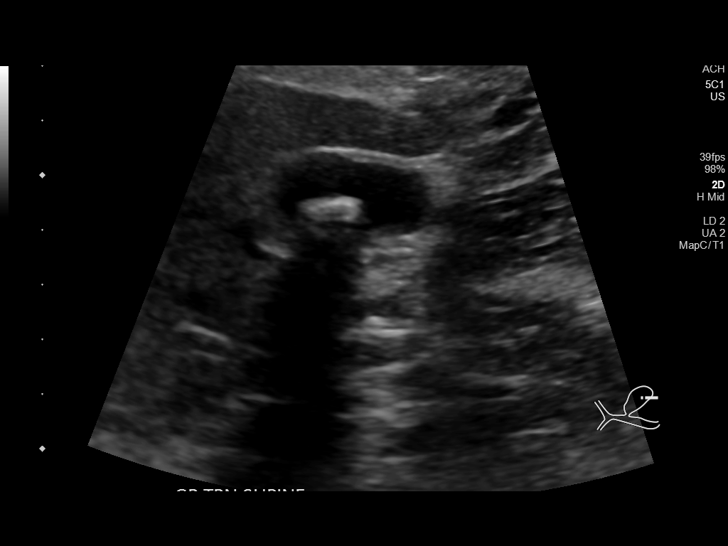
[im 26/101]
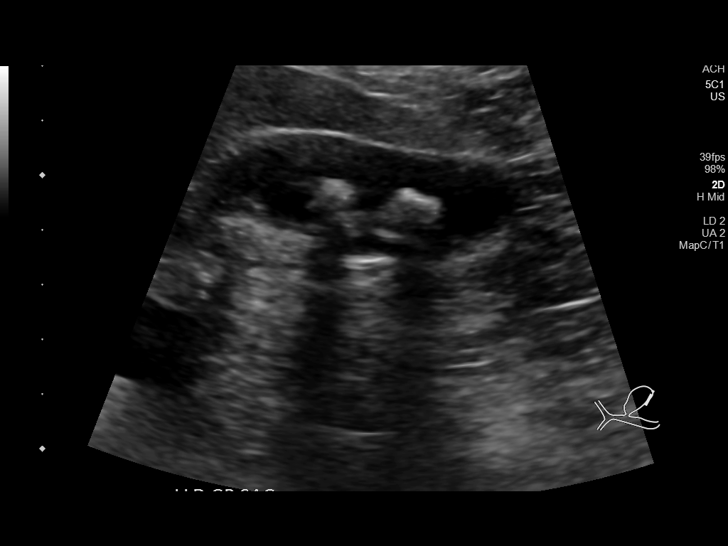
[im 34/101]
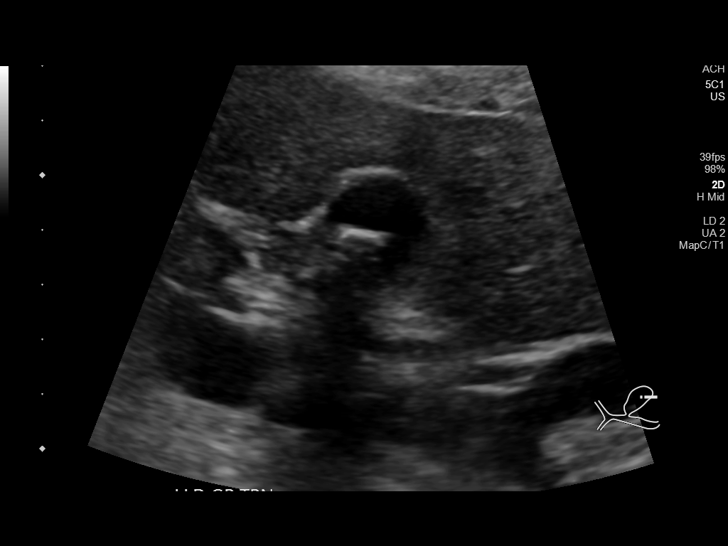
[im 38/101]
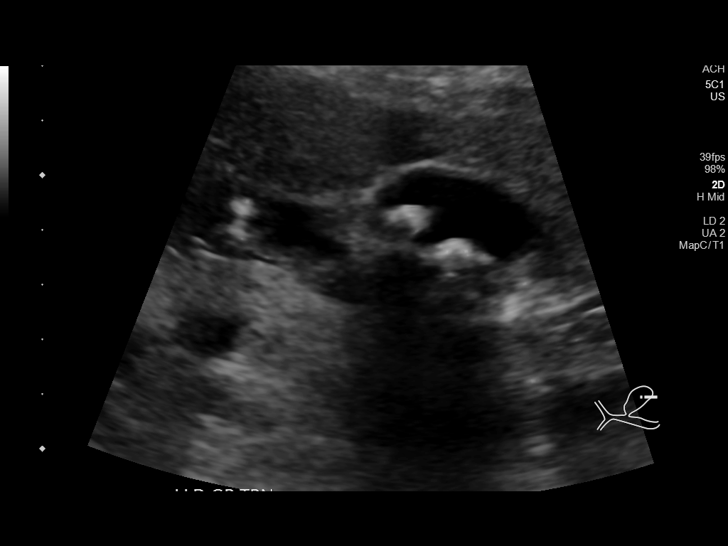
[im 46/101]
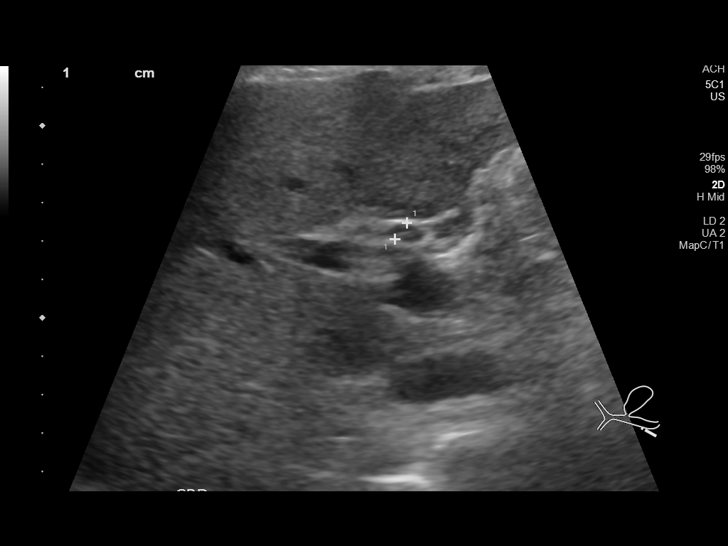
[im 55/101]
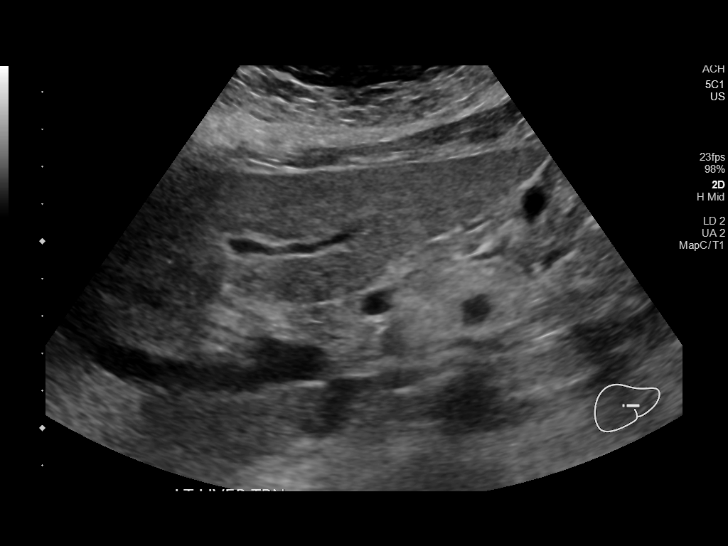
[im 63/101]
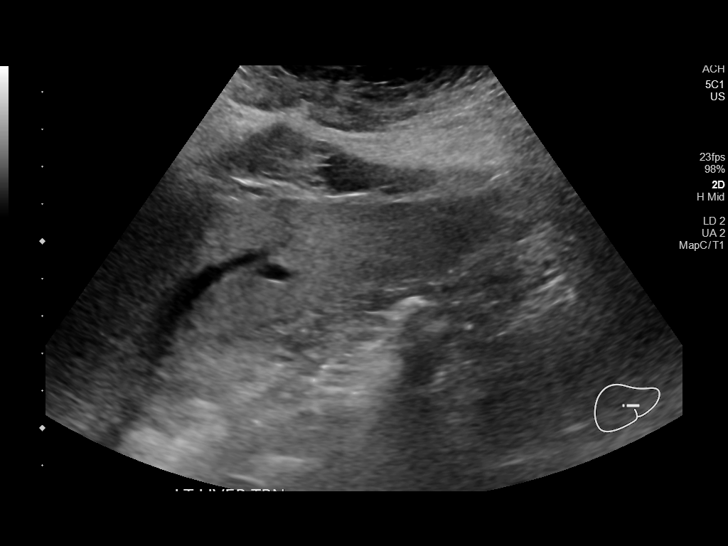
[im 67/101]
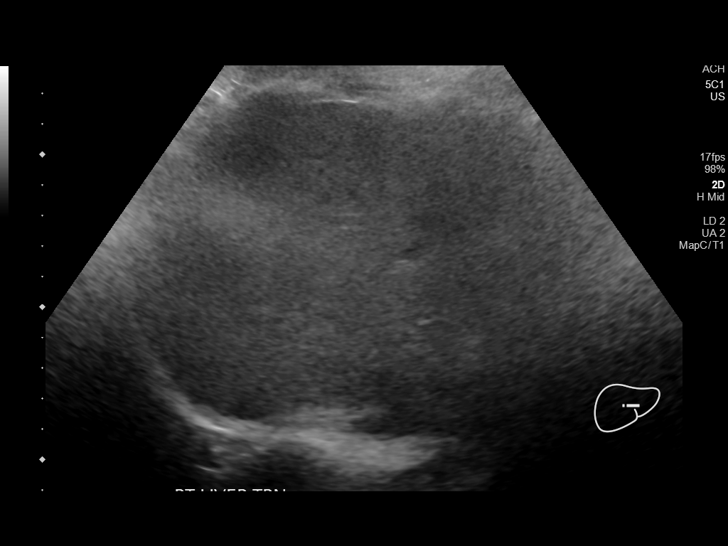
[im 76/101]
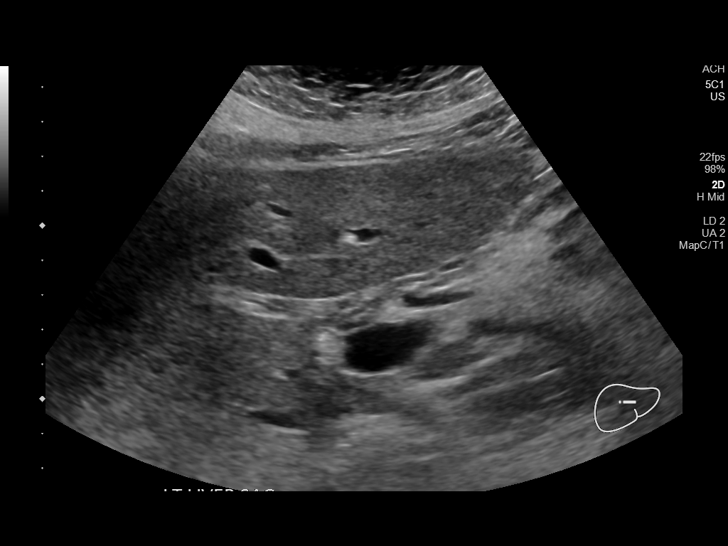
[im 84/101]
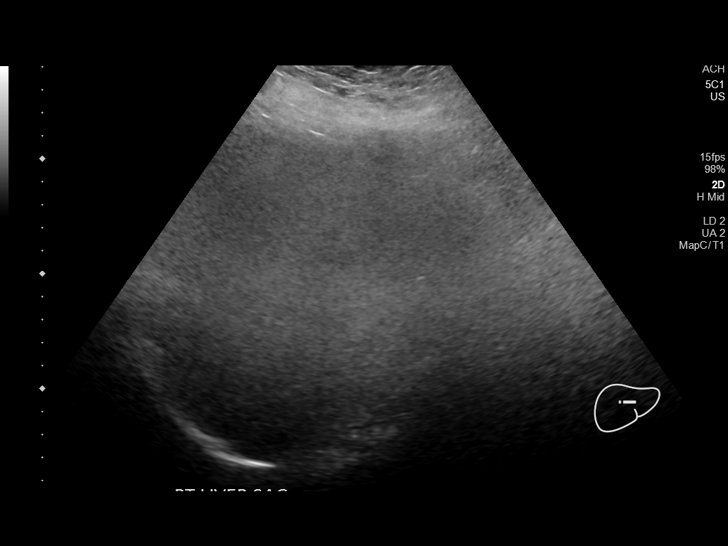
[im 92/101]
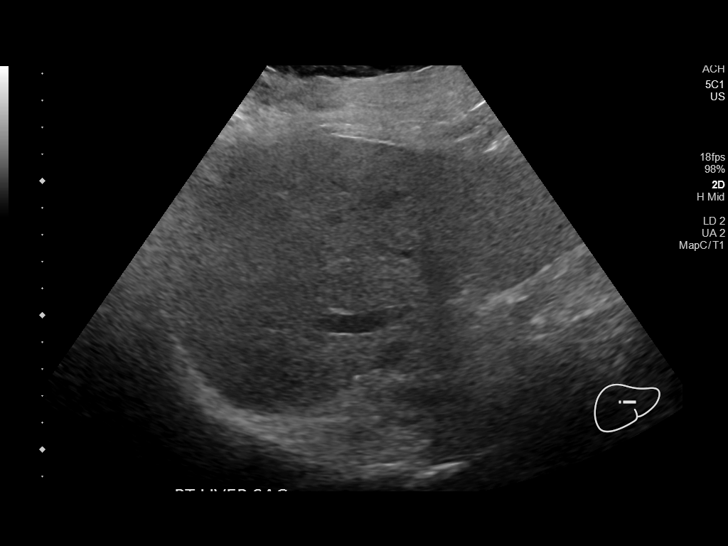
[im 101/101]
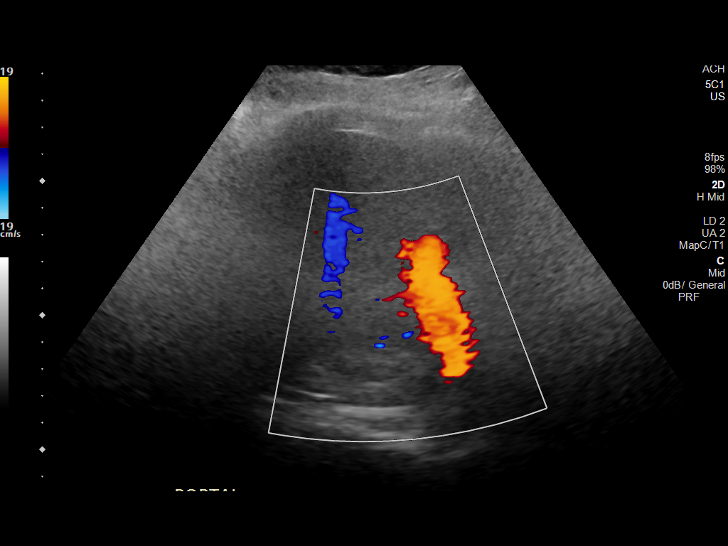

[14 of 25 positions shown; findings below may reference images not displayed]

FINDINGS: Gallbladder:

Multiple gallstones are again noted. There is no gallbladder wall
thickening or sonographic Murphy sign.

Common bile duct:

Diameter: 5 mm.  No evidence of choledocholithiasis.

Liver:

The hepatic echotexture is mildly heterogeneous and increased,
similar to previous study. No focal lesion identified. Portal vein
is patent on color Doppler imaging with normal direction of blood
flow towards the liver.

Other: None.
IMPRESSION: 1. Cholelithiasis without evidence of cholecystitis.
2. No evidence of biliary dilatation.
3. Nonspecific mildly increased hepatic echogenicity as before. No
focal abnormality identified.

## 2019-10-26 MED ORDER — CLONAZEPAM 0.5 MG PO TABS
0.5000 mg | ORAL_TABLET | Freq: Two times a day (BID) | ORAL | Status: DC | PRN
  Administered 2019-10-26 – 2019-10-29 (×3): 0.5 mg via ORAL
  Filled 2019-10-26 (×4): qty 1

## 2019-10-26 MED ORDER — SODIUM CHLORIDE 0.9 % IV SOLN
100.0000 mg | Freq: Every day | INTRAVENOUS | Status: AC
  Administered 2019-10-27 – 2019-10-30 (×4): 100 mg via INTRAVENOUS
  Filled 2019-10-26 (×2): qty 100
  Filled 2019-10-26: qty 20
  Filled 2019-10-26: qty 100

## 2019-10-26 MED ORDER — ENOXAPARIN SODIUM 60 MG/0.6ML ~~LOC~~ SOLN
55.0000 mg | Freq: Every day | SUBCUTANEOUS | Status: DC
  Administered 2019-10-26 – 2019-10-30 (×5): 55 mg via SUBCUTANEOUS
  Filled 2019-10-26 (×5): qty 0.6

## 2019-10-26 MED ORDER — POTASSIUM CHLORIDE CRYS ER 20 MEQ PO TBCR
40.0000 meq | EXTENDED_RELEASE_TABLET | ORAL | Status: AC
  Administered 2019-10-26 (×3): 40 meq via ORAL
  Filled 2019-10-26 (×3): qty 2

## 2019-10-26 MED ORDER — INSULIN ASPART 100 UNIT/ML ~~LOC~~ SOLN
0.0000 [IU] | Freq: Every day | SUBCUTANEOUS | Status: DC
  Administered 2019-10-27 – 2019-10-28 (×2): 2 [IU] via SUBCUTANEOUS
  Administered 2019-10-29: 4 [IU] via SUBCUTANEOUS

## 2019-10-26 MED ORDER — INSULIN ASPART 100 UNIT/ML ~~LOC~~ SOLN
4.0000 [IU] | Freq: Three times a day (TID) | SUBCUTANEOUS | Status: DC
  Administered 2019-10-26 – 2019-10-30 (×12): 4 [IU] via SUBCUTANEOUS

## 2019-10-26 MED ORDER — SODIUM CHLORIDE 0.9 % IV SOLN
200.0000 mg | Freq: Once | INTRAVENOUS | Status: AC
  Administered 2019-10-26: 200 mg via INTRAVENOUS
  Filled 2019-10-26: qty 200

## 2019-10-26 MED ORDER — ESCITALOPRAM OXALATE 10 MG PO TABS
10.0000 mg | ORAL_TABLET | Freq: Every day | ORAL | Status: DC
  Administered 2019-10-26 – 2019-10-29 (×4): 10 mg via ORAL
  Filled 2019-10-26 (×5): qty 1

## 2019-10-26 MED ORDER — INSULIN ASPART 100 UNIT/ML ~~LOC~~ SOLN
0.0000 [IU] | Freq: Three times a day (TID) | SUBCUTANEOUS | Status: DC
  Administered 2019-10-26: 5 [IU] via SUBCUTANEOUS
  Administered 2019-10-27: 11 [IU] via SUBCUTANEOUS
  Administered 2019-10-27 (×2): 5 [IU] via SUBCUTANEOUS
  Administered 2019-10-28: 11 [IU] via SUBCUTANEOUS
  Administered 2019-10-28: 8 [IU] via SUBCUTANEOUS
  Administered 2019-10-28: 5 [IU] via SUBCUTANEOUS
  Administered 2019-10-29: 4 [IU] via SUBCUTANEOUS
  Administered 2019-10-29: 3 [IU] via SUBCUTANEOUS
  Administered 2019-10-29: 8 [IU] via SUBCUTANEOUS
  Administered 2019-10-30 (×2): 5 [IU] via SUBCUTANEOUS

## 2019-10-26 MED ORDER — LINAGLIPTIN 5 MG PO TABS
5.0000 mg | ORAL_TABLET | Freq: Every day | ORAL | Status: DC
  Administered 2019-10-26 – 2019-10-30 (×5): 5 mg via ORAL
  Filled 2019-10-26 (×5): qty 1

## 2019-10-26 MED ORDER — ADULT MULTIVITAMIN W/MINERALS CH
1.0000 | ORAL_TABLET | Freq: Every day | ORAL | Status: DC
  Administered 2019-10-26 – 2019-10-30 (×5): 1 via ORAL
  Filled 2019-10-26 (×5): qty 1

## 2019-10-26 NOTE — TOC Progression Note (Signed)
Transition of Care [facility]) - Progression Note    Patient Details  Name: Kathleen Wiley MRN: [id number] Date of Birth: 25-Jun-1946  [facility]) CM/SW Contact  Purcell Mouton, RN Phone Number: 10/26/2019, 5:27 PM  Clinical Narrative:    Readmission. TOC will follow for discharge needs.        Expected Discharge Plan and Services                                                 Social Determinants of Health (SDOH) Interventions    Readmission Risk Interventions No flowsheet data found.

## 2019-10-26 NOTE — TOC Progression Note (Signed)
Transition of Care [facility]) - Progression Note    Patient Details  Name: Kathleen Wiley MRN: [id number] Date of Birth: 1946-03-17  Transition of Care [facility]) CM/SW Contact  Purcell Mouton, RN Phone Number: 10/26/2019, 5:18 PM  Clinical Narrative:     Pt from home with spouse. TOC will continue to follow for discharge needs.       Expected Discharge Plan and Services                                                 Social Determinants of Health (SDOH) Interventions    Readmission Risk Interventions No flowsheet data found.

## 2019-10-26 NOTE — Progress Notes (Signed)
PROGRESS NOTE  Kathleen Wiley XNA:[id number] DOB: 04/09/1946   PCP: Jonathon Jordan, MD  Patient is from: Home.  Patient is Jehovah witness.  DOA: 10/25/2019 LOS: 1  Brief Narrative / Interim history: 73 year old female with history of anxiety, A. fib, HTN, HLD, rectovaginal fistula, prediabetes, obesity and osteoarthritis presenting with progressive dyspnea for a week, vomiting and diarrhea for 2 days prior to admission.  In ED, COVID-19 positive.  Hemodynamically stable.  Desaturated to 87% on RA and recover to mid 90s on 2 L by .  WBC 2.5.  Platelet 89.  CRP 4.5.  D-dimer 0.52.  Other inflammatory markers marginally elevated.  Pro-Cal, lactic acid and troponin within normal.  K3.4.  AST 89.  ALT 75.  Total bili 2.9.  CXR with septal infiltrates in the right lung.  Received doxycycline x1 in ED.  Started on Solu-Medrol and breathing treatments and admitted for pneumonia due to COVID-19.  The next day, remdesivir added.  Subjective: No major events overnight of this morning.  Feels better this morning.  Still with some cough.  Dyspnea improved.  Nausea, vomiting and diarrhea resolved.  Denies UTI symptoms.  Objective: Vitals:   10/26/19 0435 10/26/19 0438 10/26/19 0830 10/26/19 1204  BP: (!) 114/55 (!) 127/55  133/69  Pulse: 64 69  67  Resp: (!) 22   20  Temp:    98.3 F (36.8 C)  TempSrc:    Oral  SpO2: 93%  91% (!) 88%  Weight:      Height:        Intake/Output Summary (Last 24 hours) at 10/26/2019 1233 Last data filed at 10/26/2019 1045 Gross per 24 hour  Intake 565 ml  Output 750 ml  Net -185 ml   Filed Weights   10/25/19 1648  Weight: 108.6 kg    Examination:  GENERAL: No acute distress.  Appears well.  HEENT: MMM.  Vision and hearing grossly intact.  NECK: Supple.  No apparent JVD.  RESP:  No IWOB.  Diminished aeration bilaterally. CVS:  RRR. Heart sounds normal.  ABD/GI/GU: Bowel sounds present. Soft. Non tender.  MSK/EXT:  No apparent deformity or  edema. Moves extremities. SKIN: no apparent skin lesion or wound NEURO: Awake, alert and oriented appropriately.  No gross deficit.  PSYCH: Calm. Normal affect.  Procedures:  None  Assessment & Plan: Acute respiratory failure with hypoxia due to COVID-19 pneumonia: patient with positive CXR and increased inflammatory markers.  Desaturated to 87% on room air requiring supplemental oxygen.  Continues to require supplemental oxygen to maintain saturation in the 90s.  Improving. Recent Labs    10/25/19 1230 10/26/19 0258  DDIMER 0.52* 0.50  FERRITIN 481* 498*  LDH 283*  --   CRP 4.5* 4.2*  -Continue IV Solu-Medrol 12/17>> -Add remdesivir. -Start subcu Lovenox for VTE prophylaxis. -Verbally consented to Actemra if needed after risk-benefit discussion. -Declined convalescent plasma or any other blood products.  Patient is Jehovah witness. -Wish to be full code-but no life support if initial resuscitation not successful. -Continue inhalers, antitussive, mucolytic's, incentive spirometry -OOB.  Gastroenteritis due to COVID-19 infection: Vomiting and diarrhea resolved. -Continue antiemetics as needed  Hyponatremia and hypokalemia: Likely due to diuretics (thiazide and Lasix) and diarrhea.  Diarrhea resolved. -Continue holding thiazide diuretics.  Lexapro could contribute to hyponatremia. -Replenish and recheck. -Continue monitoring.  Elevated liver enzymes and hyperbilirubinemia: Likely due to COVID-19 infection.  Improving. Hepatic Function Latest Ref Rng & Units 10/26/2019 10/25/2019 08/09/2016  Total Protein 6.5 - 8.1 g/dL  6.6 7.0 7.4  Albumin 3.5 - 5.0 g/dL 3.1(L) 3.5 3.6  AST 15 - 41 U/L 83(H) 89(H) 44(H)  ALT 0 - 44 U/L 67(H) 75(H) 53  Alk Phosphatase 38 - 126 U/L 85 90 60  Total Bilirubin 0.3 - 1.2 mg/dL 2.4(H) 2.9(H) 2.7(H)  Bilirubin, Direct 0.0 - 0.2 mg/dL 1.1(H) 1.3(H) -  -Continue monitoring  Hyperglycemia without diagnosis of diabetes: Likely due to steroid. Recent  Labs    10/26/19 0429 10/26/19 0755 10/26/19 1202  GLUCAP 191* 167* 247*  -Check hemoglobin A1c -Added Tradjenta, NovoLog AC -Reduce SSI from resistant to moderate.  Added night coverage  Leukopenia and thrombocytopenia: Leukopenia seems to be chronic and worse..  Thrombocytopenia seems to be new and improving. CBC Latest Ref Rng & Units 10/26/2019 10/25/2019 01/09/2019  WBC 4.0 - 10.5 K/uL 1.7(L) 2.5(L) 2.9(L)  Hemoglobin 12.0 - 15.0 g/dL 12.3 13.2 11.8(L)  Hematocrit 36.0 - 46.0 % 38.0 40.2 37.7  Platelets 150 - 400 K/uL 91(L) 89(L) 163  -Continue monitoring -Will curbside oncology if worse.  Essential hypertension: Normotensive off antihypertensive medications. -Continue holding BP meds and diuretics  History of chronic pain/osteoarthritis:  -As needed pain meds.  Discouraged NSAID.  Anxiety: Stable. -Continue home Lexapro -Added Klonopin 0.5 mg twice daily as needed       Nutrition Problem: Increased nutrient needs Etiology: acute illness(COVID-19)  Signs/Symptoms: estimated needs  Interventions: Ensure Enlive (each supplement provides 350kcal and 20 grams of protein), Magic cup, MVI   DVT prophylaxis: Start subcu Lovenox Code Status: Full code but no life prolonging measures if initial CPR and intubation unsuccessful Family Communication: Talked to patient's husband over the phone.  He has limited [place] proficiency. Disposition Plan: Remains inpatient Consultants: None   Microbiology summarized: 12/17-POC COVID-19 test negative. 12/17-blood cultures pending.  Sch Meds:  Scheduled Meds: . vitamin C  500 mg Oral Daily  . cholecalciferol  2,000 Units Oral Daily  . enoxaparin (LOVENOX) injection  55 mg Subcutaneous Daily  . feeding supplement (ENSURE ENLIVE)  237 mL Oral BID BM  . gabapentin  300 mg Oral QHS  . insulin aspart  0-20 Units Subcutaneous Q4H  . Ipratropium-Albuterol  2 puff Inhalation TID  . mouth rinse  15 mL Mouth Rinse BID  .  methylPREDNISolone (SOLU-MEDROL) injection  50 mg Intravenous Q12H  . multivitamin with minerals  1 tablet Oral Daily  . pantoprazole  40 mg Oral Daily  . potassium chloride  40 mEq Oral Q4H  . zinc sulfate  220 mg Oral Daily   Continuous Infusions: . sodium chloride 250 mL (10/25/19 1806)  . [START ON 10/27/2019] remdesivir 100 mg in NS 100 mL     PRN Meds:.sodium chloride, acetaminophen **OR** acetaminophen, chlorpheniramine-HYDROcodone, guaiFENesin-dextromethorphan, Ipratropium-Albuterol, phenol, prochlorperazine  Antimicrobials: Anti-infectives (From admission, onward)   Start     Dose/Rate Route Frequency Ordered Stop   10/27/19 1000  remdesivir 100 mg in sodium chloride 0.9 % 100 mL IVPB     100 mg 200 mL/hr over 30 Minutes Intravenous Daily 10/26/19 0732 10/31/19 0959   10/26/19 1000  remdesivir 200 mg in sodium chloride 0.9% 250 mL IVPB     200 mg 580 mL/hr over 30 Minutes Intravenous Once 10/26/19 0732 10/26/19 1115   10/25/19 1245  doxycycline (VIBRA-TABS) tablet 100 mg     100 mg Oral  Once 10/25/19 1231 10/25/19 1352       I have personally reviewed the following labs and images: CBC: Recent Labs  Lab 10/25/19 1230  10/26/19 0258  WBC 2.5* 1.7*  NEUTROABS 1.7 1.1*  HGB 13.2 12.3  HCT 40.2 38.0  MCV 98.5 99.5  PLT 89* 91*   BMP &GFR Recent Labs  Lab 10/25/19 1230 10/26/19 0258  NA 137 129*  K 3.4* 3.0*  CL 96* 95*  CO2 26 24  GLUCOSE 153* 188*  BUN 20 18  CREATININE 0.95 0.78  CALCIUM 8.5* 7.9*  MG 1.7 2.2  PHOS 3.4 3.2   Estimated Creatinine Clearance: 77.9 mL/min (by C-G formula based on SCr of 0.78 mg/dL). Liver & Pancreas: Recent Labs  Lab 10/25/19 1230 10/26/19 0258  AST 89* 83*  ALT 75* 67*  ALKPHOS 90 85  BILITOT 2.9* 2.4*  PROT 7.0 6.6  ALBUMIN 3.5 3.1*   No results for input(s): LIPASE, AMYLASE in the last 168 hours. No results for input(s): AMMONIA in the last 168 hours. Diabetic: No results for input(s): HGBA1C in the last  72 hours. Recent Labs  Lab 10/25/19 2020 10/25/19 2340 10/26/19 0429 10/26/19 0755 10/26/19 1202  GLUCAP 173* 191* 191* 167* 247*   Cardiac Enzymes: No results for input(s): CKTOTAL, CKMB, CKMBINDEX, TROPONINI in the last 168 hours. No results for input(s): PROBNP in the last 8760 hours. Coagulation Profile: No results for input(s): INR, PROTIME in the last 168 hours. Thyroid Function Tests: No results for input(s): TSH, T4TOTAL, FREET4, T3FREE, THYROIDAB in the last 72 hours. Lipid Profile: Recent Labs    10/25/19 1230  TRIG 168*   Anemia Panel: Recent Labs    10/25/19 1230 10/26/19 0258  FERRITIN 481* 498*   Urine analysis:    Component Value Date/Time   COLORURINE YELLOW 09/13/2018 [place] 09/13/2018 1244   LABSPEC 1.010 09/13/2018 1244   PHURINE 6.0 09/13/2018 [place] 09/13/2018 [place] NEGATIVE 09/13/2018 [place] NEGATIVE 09/13/2018 [place] 09/13/2018 1244   PROTEINUR NEGATIVE 09/13/2018 1244   UROBILINOGEN 1.0 08/06/2012 2046   NITRITE NEGATIVE 09/13/2018 1244   LEUKOCYTESUR TRACE (A) 09/13/2018 1244   Sepsis Labs: Invalid input(s): PROCALCITONIN, [place]  Microbiology: No results found for this or any previous visit (from the past 240 hour(s)).  Radiology Studies: DG Chest Port 1 View  Result Date: 10/25/2019 CLINICAL DATA:  Pt reports tested positive for COVID yesterday. Pt came into today due to increased SHOB, nausea, and diarrhea. Pt hypoxic on arrival at 87%. Hx of HTN and pre-diabetes. EXAM: PORTABLE CHEST 1 VIEW COMPARISON:  01/12/2018. FINDINGS: Subtle hazy infiltrates in the peripheral right upper, mid and lower lung. No left lung infiltrate is evident. No evidence of pulmonary edema. No pleural effusion or pneumothorax. Cardiac silhouette is mildly enlarged. No mediastinal or hilar masses. Skeletal structures are grossly intact. IMPRESSION: 1. Subtle infiltrates are noted  in the right lung suspicious for COVID-19 pneumonia. Left lung is clear. Electronically Signed   By: Lajean Manes M.D.   On: 10/25/2019 12:51   40 minutes with more than 50% spent in reviewing records, counseling patient/family and coordinating care.   Carolann Brazell T. [place]  If 7PM-7AM, please contact night-coverage [email or web address] Password [facility] 10/26/2019, 12:33 PM

## 2019-10-26 NOTE — Progress Notes (Addendum)
Pt stated that the bed was uncomfortable and that she wanted to go home. Pt was transferred to chair, temp in room was decreased. Pt was discouraged by this nurse to leave AMA d/t the fact that her O2 sat dropped to 85 on RA and she did not feel confident enough ambulating to BR and instead chose BSC. Pt was given 0.25 mg Xanax and was able to rest. Her POC was explained to her and she seemed satisfied.  Pt's O2 sats dropped to 86 when she transferred to/from [facility] on 2LPM. Pt recovered to 93 %once she was back in bed.

## 2019-10-26 NOTE — Progress Notes (Signed)
Initial Nutrition Assessment  RD working remotely.   DOCUMENTATION CODES:   Obesity unspecified  INTERVENTION:  - continue Ensure Enlive BID, each supplement provides 350 kcal and 20 grams of protein. - will order Magic Cup BID with meals, each supplement provides 290 kcal and 9 grams of protein. - will order daily multivitamin with minerals. - continue to encourage PO intakes.    NUTRITION DIAGNOSIS:   Increased nutrient needs related to acute illness(COVID-19) as evidenced by estimated needs.  GOAL:   Patient will meet greater than or equal to 90% of their needs  MONITOR:   PO intake, Supplement acceptance, Labs, Weight trends  REASON FOR ASSESSMENT:   Malnutrition Screening Tool  ASSESSMENT:   73 y.o. female with medical history significant of anxiety, osteoarthritis, history of cellulitis, history of afib due to Flagyl, GERD, heart murmur, endometriosis, hyperlipidemia, HTN, pre-DM, and history of rectal-vaginal fistula. She presented to the ED on 12/17 with progressive SOB x1 week. She was exposed to [place] from her husband who had an exposure at work. She has also been experiencing low-grade fever, chills, runny nose, dry cough, and mild pleuritic chest pain. She also reported multiple episodes of diarrhea in the past 2 days and multiple episodes of emesis the morning of 12/17.  Patient reports good appetite at baseline and that she does not experiencing any abdominal discomfort/pressure or nausea with intakes or have any chewing or swallowing issues at baseline. Over the past 1 week, she has had a decreasing/poor appetite and has had intermittent nausea with intakes and sometimes finds she gets increasingly SOB when eating; does not experience this to the same extent with drinking. She was able to drink part of an Ensure earlier this AM.  Per chart review, current weight is 239 lb. Weight on 01/09/19 was 229 lb and weight had been stable from 01/2018-01/2019.  Per notes: -  hyperbilirubinemia with hx of cholelithiasis - hypokalemia--repletion ordered  - pre-DM--Carb Mod diet and plan to check A1c - COVID-19 PNA   Labs reviewed; CBGs: 191 and 167 mg/dl, Na: 129 mmol/l, K: 3 mmol/l, Cl: 95 mmol/l, Ca: 7.9 mg/dl, LFTs elevated but trending down from 12/17. Medications reviewed; 500 mg ascorbic acid/day, 2000 units cholecalciferol/day, sliding scale novolog, 2 g IV Mg sulfate x1 run 12/17, 50 mg solu-medrol BID, 40 mg oral protonix/day, 40 mEq K[place]-Con x3 doses 12/18,220 mg zinc sulfate/day, 200 mg remdesivir x1 dose 12/18, 100 mg remdesivir x1 dose for 4 days starting 12/19.    NUTRITION - FOCUSED PHYSICAL EXAM:  unable to complete for COVID-19 positive patient.   Diet Order:   Diet Order            Diet heart healthy/carb modified Room service appropriate? Yes; Fluid consistency: Thin  Diet effective now              EDUCATION NEEDS:   No education needs have been identified at this time  Skin:  Skin Assessment: Reviewed RN Assessment  Last BM:  12/17  Height:   Ht Readings from Last 1 Encounters:  10/25/19 5\' 5"  (1.651 m)    Weight:   Wt Readings from Last 1 Encounters:  10/25/19 108.6 kg    Ideal Body Weight:  56.8 kg  BMI:  Body mass index is 39.84 kg/m.  Estimated Nutritional Needs:   Kcal:  1750-1950 kcal  Protein:  85-100 grams  Fluid:  >/= 2 L/day      Jarome Matin, MS, RD, LDN, CNSC Inpatient Clinical Dietitian Pager #  [phone] After hours/weekend pager # [phone]

## 2019-10-27 DIAGNOSIS — G252 Other specified forms of tremor: Secondary | ICD-10-CM

## 2019-10-27 LAB — C-REACTIVE PROTEIN: CRP: 1.7 mg/dL — ABNORMAL HIGH (ref <1.0)

## 2019-10-27 LAB — CBC WITH DIFFERENTIAL/PLATELET
Abs Immature Granulocytes: 0.05 10*3/uL (ref 0.00–0.07)
Basophils Absolute: 0 10*3/uL (ref 0.0–0.1)
Basophils Relative: 1 %
Eosinophils Absolute: 0 10*3/uL (ref 0.0–0.5)
Eosinophils Relative: 0 %
HCT: 39.2 % (ref 36.0–46.0)
Hemoglobin: 12.5 g/dL (ref 12.0–15.0)
Immature Granulocytes: 1 %
Lymphocytes Relative: 13 %
Lymphs Abs: 0.6 10*3/uL — ABNORMAL LOW (ref 0.7–4.0)
MCH: 31.3 pg (ref 26.0–34.0)
MCHC: 31.9 g/dL (ref 30.0–36.0)
MCV: 98.2 fL (ref 80.0–100.0)
Monocytes Absolute: 0.2 10*3/uL (ref 0.1–1.0)
Monocytes Relative: 4 %
Neutro Abs: 3.4 10*3/uL (ref 1.7–7.7)
Neutrophils Relative %: 81 %
Platelets: 112 10*3/uL — ABNORMAL LOW (ref 150–400)
RBC: 3.99 MIL/uL (ref 3.87–5.11)
RDW: 14.2 % (ref 11.5–15.5)
WBC: 4.2 10*3/uL (ref 4.0–10.5)
nRBC: 0 % (ref 0.0–0.2)

## 2019-10-27 LAB — FERRITIN: Ferritin: 394 ng/mL — ABNORMAL HIGH (ref 11–307)

## 2019-10-27 LAB — COMPREHENSIVE METABOLIC PANEL
ALT: 58 U/L — ABNORMAL HIGH (ref 0–44)
AST: 70 U/L — ABNORMAL HIGH (ref 15–41)
Albumin: 2.9 g/dL — ABNORMAL LOW (ref 3.5–5.0)
Alkaline Phosphatase: 76 U/L (ref 38–126)
Anion gap: 10 (ref 5–15)
BUN: 24 mg/dL — ABNORMAL HIGH (ref 8–23)
CO2: 25 mmol/L (ref 22–32)
Calcium: 8.4 mg/dL — ABNORMAL LOW (ref 8.9–10.3)
Chloride: 102 mmol/L (ref 98–111)
Creatinine, Ser: 0.87 mg/dL (ref 0.44–1.00)
GFR calc Af Amer: >60 mL/min (ref >60)
GFR calc non Af Amer: >60 mL/min (ref >60)
Glucose, Bld: 239 mg/dL — ABNORMAL HIGH (ref 70–99)
Potassium: 4.8 mmol/L (ref 3.5–5.1)
Sodium: 137 mmol/L (ref 135–145)
Total Bilirubin: 1.3 mg/dL — ABNORMAL HIGH (ref 0.3–1.2)
Total Protein: 6.4 g/dL — ABNORMAL LOW (ref 6.5–8.1)

## 2019-10-27 LAB — GLUCOSE, CAPILLARY
Glucose-Capillary: 237 mg/dL — ABNORMAL HIGH (ref 70–99)
Glucose-Capillary: 218 mg/dL — ABNORMAL HIGH (ref 70–99)
Glucose-Capillary: 303 mg/dL — ABNORMAL HIGH (ref 70–99)
Glucose-Capillary: 219 mg/dL — ABNORMAL HIGH (ref 70–99)
Glucose-Capillary: 211 mg/dL — ABNORMAL HIGH (ref 70–99)

## 2019-10-27 LAB — BILIRUBIN, DIRECT: Bilirubin, Direct: 0.7 mg/dL — ABNORMAL HIGH (ref 0.0–0.2)

## 2019-10-27 LAB — PHOSPHORUS: Phosphorus: 2.7 mg/dL (ref 2.5–4.6)

## 2019-10-27 LAB — D-DIMER, QUANTITATIVE: D-Dimer, Quant: 0.34 ug/mL-FEU (ref 0.00–0.50)

## 2019-10-27 LAB — MAGNESIUM: Magnesium: 2.2 mg/dL (ref 1.7–2.4)

## 2019-10-27 MED ORDER — INSULIN DETEMIR 100 UNIT/ML ~~LOC~~ SOLN
10.0000 [IU] | Freq: Two times a day (BID) | SUBCUTANEOUS | Status: DC
  Administered 2019-10-28 – 2019-10-30 (×5): 10 [IU] via SUBCUTANEOUS
  Filled 2019-10-27 (×7): qty 0.1

## 2019-10-27 MED ORDER — INSULIN DETEMIR 100 UNIT/ML ~~LOC~~ SOLN
10.0000 [IU] | Freq: Once | SUBCUTANEOUS | Status: AC
  Administered 2019-10-27: 10 [IU] via SUBCUTANEOUS
  Filled 2019-10-27: qty 0.1

## 2019-10-27 MED ORDER — GABAPENTIN 300 MG PO CAPS
300.0000 mg | ORAL_CAPSULE | Freq: Two times a day (BID) | ORAL | Status: DC
  Administered 2019-10-27 – 2019-10-30 (×6): 300 mg via ORAL
  Filled 2019-10-27 (×6): qty 1

## 2019-10-27 NOTE — Progress Notes (Signed)
Pt states that she is having involuntary movements of her right foot. She denies involuntary movements in any other extremity. She feels that it started around the time she came to the hospital. She denies pain in the extremity.

## 2019-10-27 NOTE — Plan of Care (Signed)
Only able to wean patient down to 2.5 liters this shift to maintain oxygen saturation at 90%.  Patient up to bathroom with oxygen, still experiencing some dyspnea on exertion.  Tolerating carb mod diet, no nausea, vomiting or diarrhea this shift.

## 2019-10-27 NOTE — Progress Notes (Signed)
PROGRESS NOTE  Kathleen Wiley MRN:[id number] DOB: 10/17/1946   PCP: Wolters, Sharon, MD  Patient is from: Home.  Patient is Jehovah witness.  DOA: 10/25/2019 LOS: 2  Brief Narrative / Interim history: 73-year-old female with history of anxiety, A. fib, HTN, HLD, rectovaginal fistula, prediabetes, obesity and osteoarthritis presenting with progressive dyspnea for a week, vomiting and diarrhea for 2 days prior to admission.  In ED, COVID-19 positive.  Hemodynamically stable.  Desaturated to 87% on RA and recover to mid 90s on 2 L by [place].  WBC 2.5.  Platelet 89.  CRP 4.5.  D-dimer 0.52.  Other inflammatory markers marginally elevated.  Pro-Cal, lactic acid and troponin within normal.  K3.4.  AST 89.  ALT 75.  Total bili 2.9.  CXR with septal infiltrates in the right lung.  Received doxycycline x1 in ED.  Started on Solu-Medrol and breathing treatments and admitted for pneumonia due to COVID-19.  The next day, remdesivir added.  Subjective: No major events overnight of this morning.  No complaints.  Reports improvement in his breathing and cough.  Denies chest pain, nausea, vomiting, abdominal pain, diarrhea or UTI symptoms.  Noted lower extremity shaking that has started in ED.  She is able to stop it consciously.  Never had this before.  Objective: Vitals:   10/27/19 0542 10/27/19 0800 10/27/19 0820 10/27/19 1152  BP: 124/80   126/70  Pulse: 66   62  Resp: 17   20  Temp: 98.7 F (37.1 C)   98.3 F (36.8 C)  TempSrc: Oral   Oral  SpO2: 92% (!) 86% 91% 92%  Weight:      Height:        Intake/Output Summary (Last 24 hours) at 10/27/2019 1342 Last data filed at 10/26/2019 1714 Gross per 24 hour  Intake 240 ml  Output --  Net 240 ml   Filed Weights   10/25/19 1648  Weight: 108.6 kg    Examination:  GENERAL: No acute distress.  Appears well.  HEENT: MMM.  Vision and hearing grossly intact.  NECK: Supple.  No apparent JVD.  RESP:  No IWOB.  Diminished aeration  bilaterally. CVS:  RRR. Heart sounds normal.  ABD/GI/GU: Bowel sounds present. Soft. Non tender.  MSK/EXT:  Moves extremities. No apparent deformity or edema.  SKIN: no apparent skin lesion or wound NEURO: Awake, alert and oriented appropriately.  No apparent focal neuro deficit.  Notable bilateral lower extremity shaking at rest. PSYCH: Calm. Normal affect.   Procedures:  None  Assessment & Plan: Acute respiratory failure with hypoxia due to COVID-19 pneumonia: patient with positive CXR and increased inflammatory markers.  Desaturated to 87% on room air requiring supplemental oxygen.  Symptoms improved although she continues to require supplemental oxygen to maintain saturation in the 90s.  Inflammatory markers downtrending. Recent Labs    10/25/19 1230 10/26/19 0258 10/27/19 0401  DDIMER 0.52* 0.50 0.34  FERRITIN 481* 498* 394*  LDH 283*  --   --   CRP 4.5* 4.2* 1.7*  -Continue IV Solu-Medrol 12/17>> -Continue remdesivir 12/18>> -Subcu Lovenox for VTE prophylaxis. -Verbally consented to Actemra if needed after risk-benefit discussion. -Declined convalescent plasma or any other blood products.  Patient is Jehovah witness. -Wish to be full code-but no life support if initial resuscitation not successful. -Continue inhalers, antitussive, mucolytic's, incentive spirometry -OOB, wean oxygen as able and daily ambulation.  Gastroenteritis due to COVID-19 infection: Vomiting and diarrhea resolved. -Continue antiemetics as needed  Hyponatremia and hypokalemia: Likely due to   diuretics (thiazide and Lasix) and diarrhea.  Resolved. -Continue holding thiazide diuretics.  Lexapro could contribute to hyponatremia. -Continue monitoring.  Elevated liver enzymes and hyperbilirubinemia: Likely due to COVID-19 infection.  Improving. Hepatic Function Latest Ref Rng & Units 10/27/2019 10/26/2019 10/25/2019  Total Protein 6.5 - 8.1 g/dL 6.4(L) 6.6 7.0  Albumin 3.5 - 5.0 g/dL 2.9(L) 3.1(L) 3.5   AST 15 - 41 U/L 70(H) 83(H) 89(H)  ALT 0 - 44 U/L 58(H) 67(H) 75(H)  Alk Phosphatase 38 - 126 U/L 76 85 90  Total Bilirubin 0.3 - 1.2 mg/dL 1.3(H) 2.4(H) 2.9(H)  Bilirubin, Direct 0.0 - 0.2 mg/dL 0.7(H) 1.1(H) 1.3(H)  -Continue monitoring  Uncontrolled diabetes with hyperglycemia: Likely due to steroid.  A1c 7.2%. Recent Labs    10/27/19 0603 10/27/19 0802 10/27/19 1147  GLUCAP 237* 218* 303*  -Start Levemir 10 units twice daily. -Continue Tradjenta, NovoLog 4 units AC -Continue moderate SSI with nightly coverage. -Start statin once LFT resolves  Leukopenia and thrombocytopenia: Leukopenia resolved.  Thrombocytopenia improved. CBC Latest Ref Rng & Units 10/27/2019 10/26/2019 10/25/2019  WBC 4.0 - 10.5 K/uL 4.2 1.7(L) 2.5(L)  Hemoglobin 12.0 - 15.0 g/dL 12.5 12.3 13.2  Hematocrit 36.0 - 46.0 % 39.2 38.0 40.2  Platelets 150 - 400 K/uL 112(L) 91(L) 89(L)  -Continue monitoring  Essential hypertension: Normotensive off antihypertensive medications. -Continue holding BP meds and diuretics  History of chronic pain/osteoarthritis:  -As needed pain meds.  Discouraged NSAID.  Lower extremity tremor/shaking: Likely related to anxiety from steroid and acute illness.  She can consciously suppress it -Continue home Lexapro and Klonopin (started here) -Increase gabapentin to 300 mg twice daily  Anxiety: -Medication adjustments as above.     Nutrition Problem: Increased nutrient needs Etiology: acute illness(COVID-19)  Signs/Symptoms: estimated needs  Interventions: Ensure Enlive (each supplement provides 350kcal and 20 grams of protein), Magic cup, MVI   DVT prophylaxis: Start subcu Lovenox Code Status: Full code but no life prolonging measures if initial CPR and intubation unsuccessful Family Communication: Talked to patient's husband over the phone on 12/18. Disposition Plan: Remains inpatient.  Could be discharged home pursue remdesivir infusion at [facility] starting Monday once  liberated from oxygen. Consultants: None   Microbiology summarized: 12/17-POC COVID-19 test negative. 12/17-blood cultures negative so far  Sch Meds:  Scheduled Meds: . vitamin C  500 mg Oral Daily  . cholecalciferol  2,000 Units Oral Daily  . enoxaparin (LOVENOX) injection  55 mg Subcutaneous Daily  . escitalopram  10 mg Oral Daily  . feeding supplement (ENSURE ENLIVE)  237 mL Oral BID BM  . gabapentin  300 mg Oral QHS  . insulin aspart  0-15 Units Subcutaneous TID WC  . insulin aspart  0-5 Units Subcutaneous QHS  . insulin aspart  4 Units Subcutaneous TID WC  . Ipratropium-Albuterol  2 puff Inhalation TID  . linagliptin  5 mg Oral Daily  . mouth rinse  15 mL Mouth Rinse BID  . methylPREDNISolone (SOLU-MEDROL) injection  50 mg Intravenous Q12H  . multivitamin with minerals  1 tablet Oral Daily  . pantoprazole  40 mg Oral Daily  . zinc sulfate  220 mg Oral Daily   Continuous Infusions: . sodium chloride 250 mL (10/25/19 1806)  . remdesivir 100 mg in NS 100 mL 100 mg (10/27/19 1059)   PRN Meds:.sodium chloride, acetaminophen **OR** acetaminophen, chlorpheniramine-HYDROcodone, clonazePAM, guaiFENesin-dextromethorphan, Ipratropium-Albuterol, phenol, prochlorperazine  Antimicrobials: Anti-infectives (From admission, onward)   Start     Dose/Rate Route Frequency Ordered Stop   10/27/19   1000  remdesivir 100 mg in sodium chloride 0.9 % 100 mL IVPB     100 mg 200 mL/hr over 30 Minutes Intravenous Daily 10/26/19 0732 10/31/19 0959   10/26/19 1000  remdesivir 200 mg in sodium chloride 0.9% 250 mL IVPB     200 mg 580 mL/hr over 30 Minutes Intravenous Once 10/26/19 0732 10/26/19 1115   10/25/19 1245  doxycycline (VIBRA-TABS) tablet 100 mg     100 mg Oral  Once 10/25/19 1231 10/25/19 1352       I have personally reviewed the following labs and images: CBC: Recent Labs  Lab 10/25/19 1230 10/26/19 0258 10/27/19 0401  WBC 2.5* 1.7* 4.2  NEUTROABS 1.7 1.1* 3.4  HGB 13.2 12.3  12.5  HCT 40.2 38.0 39.2  MCV 98.5 99.5 98.2  PLT 89* 91* 112*   BMP &GFR Recent Labs  Lab 10/25/19 1230 10/26/19 0258 10/27/19 0401  NA 137 129* 137  K 3.4* 3.0* 4.8  CL 96* 95* 102  CO2 _0 GLUCOSE 153* 188* 239*  BUN 20 18 24*  CREATININE 0.95 0.78 0.87  CALCIUM 8.5* 7.9* 8.4*  MG 1.7 2.2 2.2  PHOS 3.4 3.2 2.7   Estimated Creatinine Clearance: 71.6 mL/min (by C-G formula based on SCr of 0.87 mg/dL). Liver & Pancreas: Recent Labs  Lab 10/25/19 1230 10/26/19 0258 10/27/19 0401  AST 89* 83* 70*  ALT 75* 67* 58*  ALKPHOS 90 85 76  BILITOT 2.9* 2.4* 1.3*  PROT 7.0 6.6 6.4*  ALBUMIN 3.5 3.1* 2.9*   No results for input(s): LIPASE, AMYLASE in the last 168 hours. No results for input(s): AMMONIA in the last 168 hours. Diabetic: Recent Labs    10/26/19 0258  HGBA1C 7.2*   Recent Labs  Lab 10/26/19 2031 10/26/19 2249 10/27/19 0603 10/27/19 0802 10/27/19 1147  GLUCAP 198* 199* 237* 218* 303*   Cardiac Enzymes: No results for input(s): CKTOTAL, CKMB, CKMBINDEX, TROPONINI in the last 168 hours. No results for input(s): PROBNP in the last 8760 hours. Coagulation Profile: No results for input(s): INR, PROTIME in the last 168 hours. Thyroid Function Tests: No results for input(s): TSH, T4TOTAL, FREET4, T3FREE, THYROIDAB in the last 72 hours. Lipid Profile: Recent Labs    10/25/19 1230  TRIG 168*   Anemia Panel: Recent Labs    10/26/19 0258 10/27/19 0401  FERRITIN 498* 394*   Urine analysis:    Component Value Date/Time   COLORURINE YELLOW 09/13/2018 [place] 09/13/2018 1244   LABSPEC 1.010 09/13/2018 1244   PHURINE 6.0 09/13/2018 [place] 09/13/2018 [place] NEGATIVE 09/13/2018 [place] NEGATIVE 09/13/2018 1244   [place] 09/13/2018 1244   PROTEINUR NEGATIVE 09/13/2018 1244   UROBILINOGEN 1.0 08/06/2012 2046   NITRITE NEGATIVE 09/13/2018 1244   LEUKOCYTESUR TRACE (A) 09/13/2018  1244   Sepsis Labs: Invalid input(s): PROCALCITONIN, [place]  Microbiology: Recent Results (from the past 240 hour(s))  Blood Culture (routine x 2)     Status: None (Preliminary result)   Collection Time: 10/25/19 12:30 PM   Specimen: BLOOD  Result Value Ref Range Status   Specimen Description   Final    BLOOD RIGHT ANTECUBITAL Performed at [facility] [address] [place], [place] [address]    Special Requests   Final    BOTTLES DRAWN AEROBIC ONLY Blood Culture adequate volume Performed at [facility] [address], [place], [place] [address]  Culture   Final    NO GROWTH 1 DAY Performed at [facility], [place] [address], [place], [place] [address]    Report Status PENDING  Incomplete  Blood Culture (routine x 2)     Status: None (Preliminary result)   Collection Time: 10/25/19 12:30 PM   Specimen: BLOOD  Result Value Ref Range Status   Specimen Description   Final    BLOOD RIGHT ANTECUBITAL Performed at [facility] [address], [place], [place] [address]    Special Requests   Final    BOTTLES DRAWN AEROBIC ONLY Blood Culture adequate volume Performed at [facility] [address], [place], [place] [address]    Culture   Final    NO GROWTH 1 DAY Performed at [facility], [place] [address] [place], [place] [address]    Report Status PENDING  Incomplete    Radiology Studies: US Abdomen Limited RUQ  Result Date: 10/26/2019 CLINICAL DATA:  Elevated liver function studies. Previous appendectomy. EXAM: ULTRASOUND ABDOMEN LIMITED RIGHT UPPER QUADRANT COMPARISON:  Ultrasound 08/07/2019. FINDINGS: Gallbladder: Multiple gallstones are again noted. There is no gallbladder wall thickening or sonographic Murphy sign. Common bile duct: Diameter: 5 mm.  No evidence of choledocholithiasis. Liver: The hepatic echotexture is mildly heterogeneous and increased, similar to previous  study. No focal lesion identified. Portal vein is patent on color Doppler imaging with normal direction of blood flow towards the liver. Other: None. IMPRESSION: 1. Cholelithiasis without evidence of cholecystitis. 2. No evidence of biliary dilatation. 3. Nonspecific mildly increased hepatic echogenicity as before. No focal abnormality identified. Electronically Signed   By: Richardean Sale M.D.   On: 10/26/2019 17:10    Mayte Diers T. Adrian  If 7PM-7AM, please contact night-coverage [email or web address] Password [email or web address] 10/27/2019, 1:42 PM

## 2019-10-28 LAB — CBC WITH DIFFERENTIAL/PLATELET
Abs Immature Granulocytes: 0.05 10*3/uL (ref 0.00–0.07)
Basophils Absolute: 0 10*3/uL (ref 0.0–0.1)
Basophils Relative: 0 %
Eosinophils Absolute: 0 10*3/uL (ref 0.0–0.5)
Eosinophils Relative: 0 %
HCT: 39.6 % (ref 36.0–46.0)
Hemoglobin: 12.5 g/dL (ref 12.0–15.0)
Immature Granulocytes: 1 %
Lymphocytes Relative: 13 %
Lymphs Abs: 0.5 10*3/uL — ABNORMAL LOW (ref 0.7–4.0)
MCH: 31.6 pg (ref 26.0–34.0)
MCHC: 31.6 g/dL (ref 30.0–36.0)
MCV: 100 fL (ref 80.0–100.0)
Monocytes Absolute: 0.2 10*3/uL (ref 0.1–1.0)
Monocytes Relative: 4 %
Neutro Abs: 3.3 10*3/uL (ref 1.7–7.7)
Neutrophils Relative %: 82 %
Platelets: 127 10*3/uL — ABNORMAL LOW (ref 150–400)
RBC: 3.96 MIL/uL (ref 3.87–5.11)
RDW: 14.3 % (ref 11.5–15.5)
WBC: 4 10*3/uL (ref 4.0–10.5)
nRBC: 0 % (ref 0.0–0.2)

## 2019-10-28 LAB — C-REACTIVE PROTEIN: CRP: 1 mg/dL — ABNORMAL HIGH (ref <1.0)

## 2019-10-28 LAB — COMPREHENSIVE METABOLIC PANEL
ALT: 51 U/L — ABNORMAL HIGH (ref 0–44)
AST: 52 U/L — ABNORMAL HIGH (ref 15–41)
Albumin: 2.9 g/dL — ABNORMAL LOW (ref 3.5–5.0)
Alkaline Phosphatase: 77 U/L (ref 38–126)
Anion gap: 9 (ref 5–15)
BUN: 31 mg/dL — ABNORMAL HIGH (ref 8–23)
CO2: 25 mmol/L (ref 22–32)
Calcium: 8.5 mg/dL — ABNORMAL LOW (ref 8.9–10.3)
Chloride: 103 mmol/L (ref 98–111)
Creatinine, Ser: 0.76 mg/dL (ref 0.44–1.00)
GFR calc Af Amer: >60 mL/min (ref >60)
GFR calc non Af Amer: >60 mL/min (ref >60)
Glucose, Bld: 245 mg/dL — ABNORMAL HIGH (ref 70–99)
Potassium: 4.4 mmol/L (ref 3.5–5.1)
Sodium: 137 mmol/L (ref 135–145)
Total Bilirubin: 1 mg/dL (ref 0.3–1.2)
Total Protein: 6.4 g/dL — ABNORMAL LOW (ref 6.5–8.1)

## 2019-10-28 LAB — GLUCOSE, CAPILLARY
Glucose-Capillary: 269 mg/dL — ABNORMAL HIGH (ref 70–99)
Glucose-Capillary: 313 mg/dL — ABNORMAL HIGH (ref 70–99)
Glucose-Capillary: 221 mg/dL — ABNORMAL HIGH (ref 70–99)
Glucose-Capillary: 219 mg/dL — ABNORMAL HIGH (ref 70–99)

## 2019-10-28 LAB — MAGNESIUM: Magnesium: 2 mg/dL (ref 1.7–2.4)

## 2019-10-28 LAB — FERRITIN: Ferritin: 309 ng/mL — ABNORMAL HIGH (ref 11–307)

## 2019-10-28 LAB — PHOSPHORUS: Phosphorus: 2.9 mg/dL (ref 2.5–4.6)

## 2019-10-28 LAB — D-DIMER, QUANTITATIVE: D-Dimer, Quant: 0.31 ug/mL-FEU (ref 0.00–0.50)

## 2019-10-28 LAB — BILIRUBIN, DIRECT: Bilirubin, Direct: 0.5 mg/dL — ABNORMAL HIGH (ref 0.0–0.2)

## 2019-10-28 NOTE — Progress Notes (Signed)
PROGRESS NOTE  Kathleen Wiley [id number] DOB: 13-Nov-1945 DOA: 10/25/2019 PCP: Jonathon Jordan, MD   LOS: 3 days   Brief Narrative / Interim history: 73 year old female with A. fib, HTN, HLD, prediabetes, obesity who came to the hospital with COVID-19 viral illness and hypoxic respiratory failure.  Subjective / 24h Interval events: Doing well this morning, feeling like she is improving.  She denies any chest pain, abdominal pain, no nausea or vomiting.  Assessment & Plan:  Principal Problem Acute Hypoxic Respiratory Failure due to Covid-19 Viral Illness -Continue remdesivir and steroids, clinically she appears to be improving and inflammatory markers improving as well -Already consented for Actemra if needed -She is Jehovah's Witness   COVID-19 Labs  Recent Labs    10/26/19 0258 10/27/19 0401 10/28/19 0335  DDIMER 0.50 0.34 0.31  FERRITIN 498* 394* 309*  CRP 4.2* 1.7* 1.0*    No results found for: SARSCOV2NAA   Active Problems COVID-19 gastroenteritis -Resolved  Hyponatremia, hypokalemia -Stable, improving, continue to monitor  Elevated LFTs -In the setting of viral illness  Uncontrolled type 2 diabetes mellitus, A1c 7.2 -With hyperglycemia due to steroid use, continue Levemir and short-acting insulin  CBG (last 3)  Recent Labs    10/27/19 2101 10/28/19 0728 10/28/19 1239  GLUCAP 211* 269* 313*   Essential hypertension -Normotensive off antihypertensive meds  Scheduled Meds: . vitamin C  500 mg Oral Daily  . cholecalciferol  2,000 Units Oral Daily  . enoxaparin (LOVENOX) injection  55 mg Subcutaneous Daily  . escitalopram  10 mg Oral Daily  . feeding supplement (ENSURE ENLIVE)  237 mL Oral BID BM  . gabapentin  300 mg Oral BID  . insulin aspart  0-15 Units Subcutaneous TID WC  . insulin aspart  0-5 Units Subcutaneous QHS  . insulin aspart  4 Units Subcutaneous TID WC  . insulin detemir  10 Units Subcutaneous BID  . Ipratropium-Albuterol  2  puff Inhalation TID  . linagliptin  5 mg Oral Daily  . mouth rinse  15 mL Mouth Rinse BID  . methylPREDNISolone (SOLU-MEDROL) injection  50 mg Intravenous Q12H  . multivitamin with minerals  1 tablet Oral Daily  . pantoprazole  40 mg Oral Daily  . zinc sulfate  220 mg Oral Daily   Continuous Infusions: . sodium chloride 250 mL (10/25/19 1806)  . remdesivir 100 mg in NS 100 mL 100 mg (10/28/19 0902)   PRN Meds:.sodium chloride, acetaminophen **OR** acetaminophen, chlorpheniramine-HYDROcodone, clonazePAM, guaiFENesin-dextromethorphan, Ipratropium-Albuterol, phenol, prochlorperazine  DVT prophylaxis: Lovenox Code Status: Full code Family Communication: Discussed with patient Disposition Plan: Home when ready  Consultants:  None  Procedures:  none  Microbiology: none  Antimicrobials: none   Objective: Vitals:   10/27/19 1152 10/27/19 1353 10/27/19 2103 10/28/19 0715  BP: 126/70  118/69   Pulse: 62  65   Resp: 20  18   Temp: 98.3 F (36.8 C)  98.6 F (37 C)   TempSrc: Oral  Oral   SpO2: 92% 91% 93% 91%  Weight:      Height:        Intake/Output Summary (Last 24 hours) at 10/28/2019 1350 Last data filed at 10/28/2019 0300 Gross per 24 hour  Intake 113.58 ml  Output --  Net 113.58 ml   Filed Weights   10/25/19 1648  Weight: 108.6 kg    Examination:  Constitutional: NAD Eyes: no scleral icterus ENMT: Mucous membranes are moist.  Neck: normal, supple Respiratory: clear to auscultation bilaterally, no wheezing, no crackles. Normal  respiratory effort.  Cardiovascular: Regular rate and rhythm, no murmurs / rubs / gallops. No LE edema.  Abdomen: non distended, no tenderness. Bowel sounds positive.  Musculoskeletal: no clubbing / cyanosis.  Skin: no rashes Neurologic: CN 2-12 grossly intact. Strength 5/5 in all 4.   Data Reviewed: I have independently reviewed following labs and imaging studies   CBC: Recent Labs  Lab 10/25/19 1230 10/26/19 0258  10/27/19 0401 10/28/19 0335  WBC 2.5* 1.7* 4.2 4.0  NEUTROABS 1.7 1.1* 3.4 3.3  HGB 13.2 12.3 12.5 12.5  HCT 40.2 38.0 39.2 39.6  MCV 98.5 99.5 98.2 100.0  PLT 89* 91* 112* [id number]*   Basic Metabolic Panel: Recent Labs  Lab 10/25/19 1230 10/26/19 0258 10/27/19 0401 10/28/19 0335  NA 137 129* 137 137  K 3.4* 3.0* 4.8 4.4  CL 96* 95* 102 103  CO2 26 24 25 25   GLUCOSE 153* 188* 239* 245*  BUN 20 18 24* 31*  CREATININE 0.95 0.78 0.87 0.76  CALCIUM 8.5* 7.9* 8.4* 8.5*  MG 1.7 2.2 2.2 2.0  PHOS 3.4 3.2 2.7 2.9   GFR: Estimated Creatinine Clearance: 77.9 mL/min (by C-G formula based on SCr of 0.76 mg/dL). Liver Function Tests: Recent Labs  Lab 10/25/19 1230 10/26/19 0258 10/27/19 0401 10/28/19 0335  AST 89* 83* 70* 52*  ALT 75* 67* 58* 51*  ALKPHOS 90 85 76 77  BILITOT 2.9* 2.4* 1.3* 1.0  PROT 7.0 6.6 6.4* 6.4*  ALBUMIN 3.5 3.1* 2.9* 2.9*   No results for input(s): LIPASE, AMYLASE in the last 168 hours. No results for input(s): AMMONIA in the last 168 hours. Coagulation Profile: No results for input(s): INR, PROTIME in the last 168 hours. Cardiac Enzymes: No results for input(s): CKTOTAL, CKMB, CKMBINDEX, TROPONINI in the last 168 hours. BNP (last 3 results) No results for input(s): PROBNP in the last 8760 hours. HbA1C: Recent Labs    10/26/19 0258  HGBA1C 7.2*   CBG: Recent Labs  Lab 10/27/19 1147 10/27/19 1704 10/27/19 2101 10/28/19 0728 10/28/19 1239  GLUCAP 303* 219* 211* 269* 313*   Lipid Profile: No results for input(s): CHOL, HDL, LDLCALC, TRIG, CHOLHDL, LDLDIRECT in the last 72 hours. Thyroid Function Tests: No results for input(s): TSH, T4TOTAL, FREET4, T3FREE, THYROIDAB in the last 72 hours. Anemia Panel: Recent Labs    10/27/19 0401 10/28/19 0335  FERRITIN 394* 309*   Urine analysis:    Component Value Date/Time   COLORURINE YELLOW 09/13/2018 [place] 09/13/2018 1244   LABSPEC 1.010 09/13/2018 1244   PHURINE 6.0  09/13/2018 [place] 09/13/2018 [place] NEGATIVE 09/13/2018 [place] 09/13/2018 [place] 09/13/2018 1244   PROTEINUR NEGATIVE 09/13/2018 1244   UROBILINOGEN 1.0 08/06/2012 2046   NITRITE NEGATIVE 09/13/2018 1244   LEUKOCYTESUR TRACE (A) 09/13/2018 1244   Sepsis Labs: Invalid input(s): PROCALCITONIN, LACTICIDVEN  Recent Results (from the past 240 hour(s))  Blood Culture (routine x 2)     Status: None (Preliminary result)   Collection Time: 10/25/19 12:30 PM   Specimen: BLOOD  Result Value Ref Range Status   Specimen Description   Final    BLOOD RIGHT ANTECUBITAL Performed at [facility] [address], [place], [place] [address]    Special Requests   Final    BOTTLES DRAWN AEROBIC ONLY Blood Culture adequate volume Performed at [facility] [address], [place], [place] [address]    Culture  Final    NO GROWTH 3 DAYS Performed at [facility], [place] [address], [place], [place] [address]    Report Status PENDING  Incomplete  Blood Culture (routine x 2)     Status: None (Preliminary result)   Collection Time: 10/25/19 12:30 PM   Specimen: BLOOD  Result Value Ref Range Status   Specimen Description   Final    BLOOD RIGHT ANTECUBITAL Performed at [facility] [address], [place], [place] [address]    Special Requests   Final    BOTTLES DRAWN AEROBIC ONLY Blood Culture adequate volume Performed at [facility] [address] [place], [place] [address]    Culture   Final    NO GROWTH 3 DAYS Performed at [facility], [place] [address], [place],  [address]    Report Status PENDING  Incomplete      Radiology Studies: US Abdomen Limited RUQ  Result Date: 10/26/2019 CLINICAL DATA:  Elevated liver function studies. Previous appendectomy. EXAM: ULTRASOUND ABDOMEN LIMITED RIGHT UPPER QUADRANT COMPARISON:  Ultrasound  08/07/2019. FINDINGS: Gallbladder: Multiple gallstones are again noted. There is no gallbladder wall thickening or sonographic Murphy sign. Common bile duct: Diameter: 5 mm.  No evidence of choledocholithiasis. Liver: The hepatic echotexture is mildly heterogeneous and increased, similar to previous study. No focal lesion identified. Portal vein is patent on color Doppler imaging with normal direction of blood flow towards the liver. Other: None. IMPRESSION: 1. Cholelithiasis without evidence of cholecystitis. 2. No evidence of biliary dilatation. 3. Nonspecific mildly increased hepatic echogenicity as before. No focal abnormality identified. Electronically Signed   By: Richardean Sale M.D.   On: 10/26/2019 17:10      Marzetta Board, MD, PhD [facility]  Contact via  [email or web address]  [place] P: [phone] F: [phone]

## 2019-10-28 NOTE — Plan of Care (Signed)
Patient maintains oxygen saturation around 90% on 3 liters, unable to wean oxygen down further at this time.  Patient denies dyspnea at rest and states her dyspnea with exertion has improved.

## 2019-10-29 LAB — BILIRUBIN, DIRECT: Bilirubin, Direct: 0.5 mg/dL — ABNORMAL HIGH (ref 0.0–0.2)

## 2019-10-29 LAB — CBC WITH DIFFERENTIAL/PLATELET
Abs Immature Granulocytes: 0.03 10*3/uL (ref 0.00–0.07)
Basophils Absolute: 0 10*3/uL (ref 0.0–0.1)
Basophils Relative: 0 %
Eosinophils Absolute: 0 10*3/uL (ref 0.0–0.5)
Eosinophils Relative: 0 %
HCT: 38.2 % (ref 36.0–46.0)
Hemoglobin: 12.1 g/dL (ref 12.0–15.0)
Immature Granulocytes: 1 %
Lymphocytes Relative: 15 %
Lymphs Abs: 0.4 10*3/uL — ABNORMAL LOW (ref 0.7–4.0)
MCH: 31 pg (ref 26.0–34.0)
MCHC: 31.7 g/dL (ref 30.0–36.0)
MCV: 97.9 fL (ref 80.0–100.0)
Monocytes Absolute: 0.1 10*3/uL (ref 0.1–1.0)
Monocytes Relative: 5 %
Neutro Abs: 2.3 10*3/uL (ref 1.7–7.7)
Neutrophils Relative %: 79 %
Platelets: 122 10*3/uL — ABNORMAL LOW (ref 150–400)
RBC: 3.9 MIL/uL (ref 3.87–5.11)
RDW: 13.8 % (ref 11.5–15.5)
WBC: 2.9 10*3/uL — ABNORMAL LOW (ref 4.0–10.5)
nRBC: 0 % (ref 0.0–0.2)

## 2019-10-29 LAB — COMPREHENSIVE METABOLIC PANEL
ALT: 53 U/L — ABNORMAL HIGH (ref 0–44)
AST: 50 U/L — ABNORMAL HIGH (ref 15–41)
Albumin: 3 g/dL — ABNORMAL LOW (ref 3.5–5.0)
Alkaline Phosphatase: 67 U/L (ref 38–126)
Anion gap: 7 (ref 5–15)
BUN: 27 mg/dL — ABNORMAL HIGH (ref 8–23)
CO2: 29 mmol/L (ref 22–32)
Calcium: 8.3 mg/dL — ABNORMAL LOW (ref 8.9–10.3)
Chloride: 103 mmol/L (ref 98–111)
Creatinine, Ser: 0.72 mg/dL (ref 0.44–1.00)
GFR calc Af Amer: >60 mL/min (ref >60)
GFR calc non Af Amer: >60 mL/min (ref >60)
Glucose, Bld: 266 mg/dL — ABNORMAL HIGH (ref 70–99)
Potassium: 4.1 mmol/L (ref 3.5–5.1)
Sodium: 139 mmol/L (ref 135–145)
Total Bilirubin: 1.5 mg/dL — ABNORMAL HIGH (ref 0.3–1.2)
Total Protein: 5.9 g/dL — ABNORMAL LOW (ref 6.5–8.1)

## 2019-10-29 LAB — MAGNESIUM: Magnesium: 2.1 mg/dL (ref 1.7–2.4)

## 2019-10-29 LAB — GLUCOSE, CAPILLARY
Glucose-Capillary: 174 mg/dL — ABNORMAL HIGH (ref 70–99)
Glucose-Capillary: 224 mg/dL — ABNORMAL HIGH (ref 70–99)
Glucose-Capillary: 254 mg/dL — ABNORMAL HIGH (ref 70–99)
Glucose-Capillary: 321 mg/dL — ABNORMAL HIGH (ref 70–99)

## 2019-10-29 LAB — FERRITIN: Ferritin: 257 ng/mL (ref 11–307)

## 2019-10-29 LAB — PHOSPHORUS: Phosphorus: 2.9 mg/dL (ref 2.5–4.6)

## 2019-10-29 LAB — C-REACTIVE PROTEIN: CRP: 0.6 mg/dL (ref <1.0)

## 2019-10-29 LAB — D-DIMER, QUANTITATIVE: D-Dimer, Quant: 0.37 ug/mL-FEU (ref 0.00–0.50)

## 2019-10-29 MED ORDER — FUROSEMIDE 10 MG/ML IJ SOLN
40.0000 mg | Freq: Once | INTRAMUSCULAR | Status: AC
  Administered 2019-10-29: 40 mg via INTRAVENOUS
  Filled 2019-10-29: qty 4

## 2019-10-29 NOTE — Progress Notes (Signed)
Patient remains on 3 liters of O2, her O2 saturation drops to 88-89% when getting up and going to bathroom. At rest O2 saturations maintains around 93%.

## 2019-10-29 NOTE — Progress Notes (Signed)
PROGRESS NOTE  Kathleen Wiley [id number] DOB: 01/21/1946 DOA: 10/25/2019 PCP: Jonathon Jordan, MD   LOS: 4 days   Brief Narrative / Interim history: 73 year old female with A. fib, HTN, HLD, prediabetes, obesity who came to the hospital with COVID-19 viral illness and hypoxic respiratory failure.  Subjective / 24h Interval events: Feeling better, stronger.  Denies any shortness of breath, no chest pain, no abdominal pain, nausea or vomiting  Assessment & Plan:  Principal Problem Acute Hypoxic Respiratory Failure due to Covid-19 Viral Illness -Continue remdesivir and steroids, clinically she appears to be improving and inflammatory markers improving as well -Already consented for Actemra if needed -She is [employer] -She is to finish remdesivir on 12/22, if she weans off to room air able to go home then -Keep on the dry side, will give Lasix x1 today -Inflammatory markers improving   COVID-19 Labs  Recent Labs    10/27/19 0401 10/28/19 0335 10/29/19 0333  DDIMER 0.34 0.31 0.37  FERRITIN 394* 309* 257  CRP 1.7* 1.0* 0.6    No results found for: SARSCOV2NAA   Active Problems COVID-19 gastroenteritis -Resolved, able to tolerate a regular diet  Hyponatremia, hypokalemia -Stable, improving, continue to monitor  Elevated LFTs -In the setting of viral illness  Uncontrolled type 2 diabetes mellitus, A1c 7.2 -With hyperglycemia due to steroid use, continue Levemir and short-acting insulin  CBG (last 3)  Recent Labs    10/28/19 1732 10/28/19 2040 10/29/19 0829  GLUCAP 221* 219* 174*   Essential hypertension -Blood pressure acceptable  Scheduled Meds: . vitamin C  500 mg Oral Daily  . cholecalciferol  2,000 Units Oral Daily  . enoxaparin (LOVENOX) injection  55 mg Subcutaneous Daily  . escitalopram  10 mg Oral Daily  . feeding supplement (ENSURE ENLIVE)  237 mL Oral BID BM  . gabapentin  300 mg Oral BID  . insulin aspart  0-15 Units Subcutaneous  TID WC  . insulin aspart  0-5 Units Subcutaneous QHS  . insulin aspart  4 Units Subcutaneous TID WC  . insulin detemir  10 Units Subcutaneous BID  . Ipratropium-Albuterol  2 puff Inhalation TID  . linagliptin  5 mg Oral Daily  . mouth rinse  15 mL Mouth Rinse BID  . methylPREDNISolone (SOLU-MEDROL) injection  50 mg Intravenous Q12H  . multivitamin with minerals  1 tablet Oral Daily  . pantoprazole  40 mg Oral Daily  . zinc sulfate  220 mg Oral Daily   Continuous Infusions: . sodium chloride 250 mL (10/25/19 1806)  . remdesivir 100 mg in NS 100 mL 100 mg (10/28/19 0902)   PRN Meds:.sodium chloride, acetaminophen **OR** acetaminophen, chlorpheniramine-HYDROcodone, clonazePAM, guaiFENesin-dextromethorphan, Ipratropium-Albuterol, phenol, prochlorperazine  DVT prophylaxis: Lovenox Code Status: Full code Family Communication: Discussed with patient Disposition Plan: Home when ready  Consultants:  None  Procedures:  none  Microbiology: none  Antimicrobials: none   Objective: Vitals:   10/27/19 2103 10/28/19 0715 10/28/19 2143 10/29/19 0545  BP: 118/69  (!) 143/79 (!) 151/68  Pulse: 65  72 (!) 59  Resp: 18  18 18   Temp: 98.6 F (37 C)  97.8 F (36.6 C)   TempSrc: Oral  Oral   SpO2: 93% 91% 92% 94%  Weight:      Height:        Intake/Output Summary (Last 24 hours) at 10/29/2019 1035 Last data filed at 10/29/2019 0600 Gross per 24 hour  Intake 220.52 ml  Output --  Net 220.52 ml   [employer]  10/25/19 1648  Weight: 108.6 kg    Examination:  Constitutional: No distress Eyes: No scleral icterus ENMT: Moist membranes Neck: normal, supple Respiratory: Diminished at the bases but overall clear without wheezing or crackles, normal respiratory effort Cardiovascular: Regular rate and rhythm, no murmurs appreciated.  No peripheral edema Abdomen: Soft, NT, ND, bowel sounds positive Musculoskeletal: no clubbing / cyanosis.  Skin: No rashes seen Neurologic: No  focal deficits, equal strength  Data Reviewed: I have independently reviewed following labs and imaging studies   CBC: Recent Labs  Lab 10/25/19 1230 10/26/19 0258 10/27/19 0401 10/28/19 0335 10/29/19 0333  WBC 2.5* 1.7* 4.2 4.0 2.9*  NEUTROABS 1.7 1.1* 3.4 3.3 2.3  HGB 13.2 12.3 12.5 12.5 12.1  HCT 40.2 38.0 39.2 39.6 38.2  MCV 98.5 99.5 98.2 100.0 97.9  PLT 89* 91* 112* 127* 123XX123*   Basic Metabolic Panel: Recent Labs  Lab 10/25/19 1230 10/26/19 0258 10/27/19 0401 10/28/19 0335 10/29/19 0333  NA 137 129* 137 137 139  K 3.4* 3.0* 4.8 4.4 4.1  CL 96* 95* 102 103 103  CO2 26 24 25 25 29   GLUCOSE 153* 188* 239* 245* 266*  BUN 20 18 24* 31* 27*  CREATININE 0.95 0.78 0.87 0.76 0.72  CALCIUM 8.5* 7.9* 8.4* 8.5* 8.3*  MG 1.7 2.2 2.2 2.0 2.1  PHOS 3.4 3.2 2.7 2.9 2.9   GFR: Estimated Creatinine Clearance: 77.9 mL/min (by C-G formula based on SCr of 0.72 mg/dL). Liver Function Tests: Recent Labs  Lab 10/25/19 1230 10/26/19 0258 10/27/19 0401 10/28/19 0335 10/29/19 0333  AST 89* 83* 70* 52* 50*  ALT 75* 67* 58* 51* 53*  ALKPHOS 90 85 76 77 67  BILITOT 2.9* 2.4* 1.3* 1.0 1.5*  PROT 7.0 6.6 6.4* 6.4* 5.9*  ALBUMIN 3.5 3.1* 2.9* 2.9* 3.0*   No results for input(s): LIPASE, AMYLASE in the last 168 hours. No results for input(s): AMMONIA in the last 168 hours. Coagulation Profile: No results for input(s): INR, PROTIME in the last 168 hours. Cardiac Enzymes: No results for input(s): CKTOTAL, CKMB, CKMBINDEX, TROPONINI in the last 168 hours. BNP (last 3 results) No results for input(s): PROBNP in the last 8760 hours. HbA1C: No results for input(s): HGBA1C in the last 72 hours. CBG: Recent Labs  Lab 10/28/19 0728 10/28/19 1239 10/28/19 1732 10/28/19 2040 10/29/19 0829  GLUCAP 269* 313* 221* 219* 174*   Lipid Profile: No results for input(s): CHOL, HDL, LDLCALC, TRIG, CHOLHDL, LDLDIRECT in the last 72 hours. Thyroid Function Tests: No results for input(s):  TSH, T4TOTAL, FREET4, T3FREE, THYROIDAB in the last 72 hours. Anemia Panel: Recent Labs    10/28/19 0335 10/29/19 0333  FERRITIN 309* 257   Urine analysis:    Component Value Date/Time   COLORURINE YELLOW 09/13/2018 [place] 09/13/2018 1244   LABSPEC 1.010 09/13/2018 1244   PHURINE 6.0 09/13/2018 [place] 09/13/2018 [place] NEGATIVE 09/13/2018 [place] 09/13/2018 1244   [place] 09/13/2018 1244   PROTEINUR NEGATIVE 09/13/2018 1244   UROBILINOGEN 1.0 08/06/2012 2046   NITRITE NEGATIVE 09/13/2018 1244   LEUKOCYTESUR TRACE (A) 09/13/2018 1244   Sepsis Labs: Invalid input(s): PROCALCITONIN, LACTICIDVEN  Recent Results (from the past 240 hour(s))  Blood Culture (routine x 2)     Status: None (Preliminary result)   Collection Time: 10/25/19 12:30 PM   Specimen: BLOOD  Result Value Ref Range Status   Specimen Description   Final  BLOOD RIGHT ANTECUBITAL Performed at [facility] [address] [place], [place] [address]    Special Requests   Final    BOTTLES DRAWN AEROBIC ONLY Blood Culture adequate volume Performed at [facility] [address] [place], [place] [address]    Culture   Final    NO GROWTH 4 DAYS Performed at [facility], [place] [address] [place], [place] [address]    Report Status PENDING  Incomplete  Blood Culture (routine x 2)     Status: None (Preliminary result)   Collection Time: 10/25/19 12:30 PM   Specimen: BLOOD  Result Value Ref Range Status   Specimen Description   Final    BLOOD RIGHT ANTECUBITAL Performed at [facility] [address] [place], [place] [address]    Special Requests   Final    BOTTLES DRAWN AEROBIC ONLY Blood Culture adequate volume Performed at [facility] [address] [place], [place] [address]    Culture   Final    NO GROWTH 4 DAYS Performed at [facility], [place] [address], [place], [place] [address]    Report Status PENDING  Incomplete      Radiology Studies: No results found.    Marzetta Board, MD, PhD [facility]  Contact via  [email or web address]  [place] P: [phone] F: [phone]

## 2019-10-29 NOTE — Care Management Important Message (Signed)
Important Message  Patient Details IM Letter given to [facility] Case Manager to present to the Patient Name: TIFA BENGTSON MRN: [id number] Date of Birth: 05-01-1946   Medicare Important Message Given:  Yes     Kerin Salen 10/29/2019, 12:20 PM

## 2019-10-29 NOTE — Progress Notes (Signed)
Patient maintains oxygen saturation around 90% on 3 liters. Still unable to wean oxygen down further at this time.  Patient denies dyspnea at rest. Ambulates to the bathroom and returned to bed with O2 saturations in the low-mid 80's. Returned to the 90's upon lying in bed on 3L.  Pt was left in room utilizing incentive spirometer.

## 2019-10-30 LAB — COMPREHENSIVE METABOLIC PANEL
ALT: 76 U/L — ABNORMAL HIGH (ref 0–44)
AST: 60 U/L — ABNORMAL HIGH (ref 15–41)
Albumin: 3.2 g/dL — ABNORMAL LOW (ref 3.5–5.0)
Alkaline Phosphatase: 75 U/L (ref 38–126)
Anion gap: 9 (ref 5–15)
BUN: 29 mg/dL — ABNORMAL HIGH (ref 8–23)
CO2: 27 mmol/L (ref 22–32)
Calcium: 8.5 mg/dL — ABNORMAL LOW (ref 8.9–10.3)
Chloride: 100 mmol/L (ref 98–111)
Creatinine, Ser: 0.8 mg/dL (ref 0.44–1.00)
GFR calc Af Amer: >60 mL/min (ref >60)
GFR calc non Af Amer: >60 mL/min (ref >60)
Glucose, Bld: 295 mg/dL — ABNORMAL HIGH (ref 70–99)
Potassium: 3.9 mmol/L (ref 3.5–5.1)
Sodium: 136 mmol/L (ref 135–145)
Total Bilirubin: 1.3 mg/dL — ABNORMAL HIGH (ref 0.3–1.2)
Total Protein: 6.4 g/dL — ABNORMAL LOW (ref 6.5–8.1)

## 2019-10-30 LAB — CBC WITH DIFFERENTIAL/PLATELET
Abs Immature Granulocytes: 0.02 10*3/uL (ref 0.00–0.07)
Basophils Absolute: 0 10*3/uL (ref 0.0–0.1)
Basophils Relative: 0 %
Eosinophils Absolute: 0 10*3/uL (ref 0.0–0.5)
Eosinophils Relative: 0 %
HCT: 41.6 % (ref 36.0–46.0)
Hemoglobin: 13.1 g/dL (ref 12.0–15.0)
Immature Granulocytes: 1 %
Lymphocytes Relative: 18 %
Lymphs Abs: 0.4 10*3/uL — ABNORMAL LOW (ref 0.7–4.0)
MCH: 31 pg (ref 26.0–34.0)
MCHC: 31.5 g/dL (ref 30.0–36.0)
MCV: 98.3 fL (ref 80.0–100.0)
Monocytes Absolute: 0.1 10*3/uL (ref 0.1–1.0)
Monocytes Relative: 5 %
Neutro Abs: 1.8 10*3/uL (ref 1.7–7.7)
Neutrophils Relative %: 76 %
Platelets: 117 10*3/uL — ABNORMAL LOW (ref 150–400)
RBC: 4.23 MIL/uL (ref 3.87–5.11)
RDW: 13.7 % (ref 11.5–15.5)
WBC: 2.3 10*3/uL — ABNORMAL LOW (ref 4.0–10.5)
nRBC: 0 % (ref 0.0–0.2)

## 2019-10-30 LAB — GLUCOSE, CAPILLARY
Glucose-Capillary: 237 mg/dL — ABNORMAL HIGH (ref 70–99)
Glucose-Capillary: 236 mg/dL — ABNORMAL HIGH (ref 70–99)

## 2019-10-30 LAB — CULTURE, BLOOD (ROUTINE X 2)
Culture: NO GROWTH
Culture: NO GROWTH
Special Requests: ADEQUATE
Special Requests: ADEQUATE

## 2019-10-30 LAB — PHOSPHORUS: Phosphorus: 3.2 mg/dL (ref 2.5–4.6)

## 2019-10-30 LAB — BILIRUBIN, DIRECT: Bilirubin, Direct: 0.5 mg/dL — ABNORMAL HIGH (ref 0.0–0.2)

## 2019-10-30 LAB — FERRITIN: Ferritin: 257 ng/mL (ref 11–307)

## 2019-10-30 LAB — MAGNESIUM: Magnesium: 2.1 mg/dL (ref 1.7–2.4)

## 2019-10-30 LAB — D-DIMER, QUANTITATIVE: D-Dimer, Quant: 0.31 ug/mL-FEU (ref 0.00–0.50)

## 2019-10-30 LAB — C-REACTIVE PROTEIN: CRP: 0.7 mg/dL (ref <1.0)

## 2019-10-30 MED ORDER — GUAIFENESIN-DM 100-10 MG/5ML PO SYRP: 10.0000 mL | ORAL_SOLUTION | ORAL | 0 refills | Status: DC | PRN

## 2019-10-30 MED ORDER — DEXAMETHASONE 6 MG PO TABS: 6.0000 mg | ORAL_TABLET | Freq: Every day | ORAL | 0 refills | Status: DC

## 2019-10-30 MED ORDER — ZINC SULFATE 220 (50 ZN) MG PO CAPS: 220.0000 mg | ORAL_CAPSULE | Freq: Every day | ORAL | 0 refills | Status: DC

## 2019-10-30 MED ORDER — ASCORBIC ACID 500 MG PO TABS: 500.0000 mg | ORAL_TABLET | Freq: Every day | ORAL | 0 refills | Status: DC

## 2019-10-30 NOTE — Progress Notes (Signed)
Pt to be discharged to home this afternoon. All discharge teaching/instructions including all medications and Schedules reviewed with Pt. Pt to be discharged to home with oxygen Tank has been delivered and use of oxygen tank reviewed with Pt. Pt verbalized understanding of all discharge teaching/instructions

## 2019-10-30 NOTE — Progress Notes (Signed)
Pt this Am resting comfortably in bed. Pt on 3L [place] this AM. MD requesting to assess Pt on room air to assess home needs for oxygen. Pt's oxygen decreased to 1l [place] and RN will reassess oxygen saturations on decreased oxygen via nasal canula. Pt informed and instructed to call for any breathing difficulties.Marland Kitchen

## 2019-10-30 NOTE — Progress Notes (Signed)
Pt's oxygen saturation level checked on 1l [place] while resting in bed. On 1l [place] O2 saturations 89-90%. Pt requesting to use the restroom. Oxygen removed and oxygen level assessed on room air 88%. Pt ambulated on room air to the bathroom with oxygen saturations of 83%. And increased work of breathing. On 1l [place] with activity O2 sats 90%. Oxygen placed at 2l [place] and oxygen saturations of 93%

## 2019-10-30 NOTE — TOC Transition Note (Signed)
Transition of Care [facility]) - CM/SW Discharge Note   Patient Details  Name: Kathleen Wiley MRN: [id number] Date of Birth: 20-Oct-1946  [facility] CM/SW Contact:  Wende Neighbors, LCSW Phone Number: 10/30/2019, 2:27 PM   Clinical Narrative:   Patient oxygen will be delivered to her room via [place]. Per RN patients husband will pick patient up from [place] once oxygen has been delivered.           Patient Goals and CMS Choice        Discharge Placement                       Discharge Plan and Services                                     Social Determinants of Health (SDOH) Interventions     Readmission Risk Interventions No flowsheet data found.

## 2019-10-30 NOTE — Discharge Summary (Signed)
Physician Discharge Summary  Kathleen Wiley [id number] DOB: 10/18/1946 DOA: 10/25/2019  PCP: Jonathon Jordan, MD  Admit date: 10/25/2019 Discharge date: 10/30/2019  Admitted From: home Disposition:  home  Recommendations for Outpatient Follow-up:  1. Follow up with PCP in 1-2 weeks 2. Patient is to complete 5 additional days of decadron on discharge 3. Needs 2 L nasal cannula on discharge  Home Health: None Equipment/Devices: Home O2  Discharge Condition: Stable CODE STATUS: Full code Diet recommendation: Regular diet, diabetic  HPI: Per admitting MD, Kathleen Wiley is a 73 y.o. female with medical history significant of anxiety, osteoarthritis, history of cellulitis, history of atrial fibrillation due to Flagyl, GERD, heart murmur, history endometriosis, hyper lipidemia, hypertension, prediabetes, history of rectal vaginal fistula who is coming to the emergency department due to progressively worse shortness of breath since last week.  She got exposed to Covid from her husband who was exposed at work.  She has low-grade fevers, chills, runny nose, dry cough and mild pleuritic chest pain on occasion, but denies chest pain, palpitations, dizziness, PND orthopnea.  She occasionally gets lower extremity edema.  She also mentions having multiple episodes of diarrhea in the past 2 days and had multiple episodes of emesis this morning.  She denies melena or hematochezia.  No dysuria, frequency hematuria.  Denies polyuria, polydipsia, polyphagia or blurred vision. ED Course: Initial vital signs temperature 98.5 F, pulse 75, respiration 22, blood pressure 120/73 mmHg and O2 sat 87% on room air.  On oxygen 2 L via nasal cannula her O2 sat has being in the high 90s.  She received doxycycline 100 mg p.o. x1 in the ED. CBC showed a white count of 2.5 with 68% neutrophils, 22% lymphocytes.  Hemoglobin 13.2 g/dL and platelets 89.  Fibrinogen was 520, D-dimer 0.52, LDH 283, CRP 4.5 and ferritin  481.  Triglycerides 168, procalcitonin was less than 10 ng/mL.  Lactic acid and troponin level x2 were normal.  Sodium 137, potassium 3.4, chloride 96 and CO2 26 mmol/L.  BUN 20, creatinine 0.95, glucose 153 and calcium 8.5 mg/dL.  Total protein 7.0 and albumin 3.5 g/dL.  AST was 89 and ALT 75 units/L.  Total bilirubin was 2.9 mg/dL.  SARS antigen was positive.  Chest radiograph shows subtle infiltrates in the right lung suspicious for COVID-19 pneumonia.  Left lung is clear  Hospital Course / Discharge diagnoses: Acute hypoxic respiratory failure due to COVID-19 viral illness/pneumonia-patient was started on remdesivir and completed 5 days while hospitalized.  Given hypoxia she was also placed on Decadron, clinically has improved and has returned back to baseline.  Her weakness is resolved, she is no longer short of breath unable to ambulate without difficulties however her O2 sats were less than 88% requiring 2 L nasal cannula.  Given clinical improvement, she will be discharged home in stable condition with 2 L nasal cannula which I anticipate she will need for short amount of time.  She is to finish Decadron 5 additional days upon discharge.  Her inflammatory markers have normalized   COVID-19 Labs  Recent Labs    10/28/19 0335 10/29/19 0333 10/30/19 0412  DDIMER 0.31 0.37 0.31  FERRITIN 309* 257 257  CRP 1.0* 0.6 0.7   Active Problems COVID-19 gastroenteritis -Resolved, able to tolerate a regular diet Hyponatremia, hypokalemia -resolved, normalized Elevated LFTs -In the setting of viral illness, overall stable and she tolerated remdesivir without difficulties Uncontrolled type 2 diabetes mellitus, A1c 7.2 -resume home medications on discharge Essential hypertension -  Blood pressure acceptable, resume home medications on discharge  Discharge Instructions   Allergies as of 10/30/2019      Reactions   Other    Refuses whole blood but does accept albumin   Metronidazole Other (See  Comments)   Chest pain, A-fib   Septra [sulfamethoxazole-trimethoprim] Other (See Comments)   Blisters      Medication List    STOP taking these medications   ibuprofen 200 MG tablet Commonly known as: ADVIL     TAKE these medications   ascorbic acid 500 MG tablet Commonly known as: VITAMIN C Take 1 tablet (500 mg total) by mouth daily. Start taking on: October 31, 2019   atenolol-chlorthalidone 50-25 MG tablet Commonly known as: TENORETIC Take 1 tablet by mouth daily.   cyanocobalamin 1000 MCG tablet Take 1,000 mcg by mouth daily.   dexamethasone 6 MG tablet Commonly known as: DECADRON Take 1 tablet (6 mg total) by mouth daily.   diphenhydrAMINE 25 MG tablet Commonly known as: BENADRYL Take 25 mg by mouth at bedtime.   escitalopram 10 MG tablet Commonly known as: LEXAPRO Take 10 mg by mouth daily.   fluticasone 50 MCG/ACT nasal spray Commonly known as: FLONASE Place 2 sprays into both nostrils daily as needed for allergies.   furosemide 20 MG tablet Commonly known as: LASIX Take 20 mg by mouth 3 (three) times a week.   gabapentin 300 MG capsule Commonly known as: NEURONTIN Take 300 mg by mouth at bedtime.   gemfibrozil 600 MG tablet Commonly known as: LOPID Take 600 mg by mouth 2 (two) times daily before a meal.   guaiFENesin-dextromethorphan 100-10 MG/5ML syrup Commonly known as: ROBITUSSIN DM Take 10 mLs by mouth every 4 (four) hours as needed for cough.   omeprazole 20 MG capsule Commonly known as: PRILOSEC Take 20 mg by mouth daily.   Ozempic (0.25 or 0.5 MG/DOSE) 2 MG/1.5ML Sopn Generic drug: Semaglutide(0.25 or 0.5MG /DOS) Inject 0.5 mg into the skin once a week.   Potassium 99 MG Tabs Take 99 mg by mouth daily.   Vitamin D 50 MCG (2000 UT) tablet Take 2,000 Units by mouth daily.   zinc sulfate 220 (50 Zn) MG capsule Take 1 capsule (220 mg total) by mouth daily.            Durable Medical Equipment  (From admission, onward)          Start     Ordered   10/30/19 1129  For home use only DME oxygen  Once    Question Answer Comment  Length of Need 6 Months   Mode or (Route) Nasal cannula   Liters per Minute 2   Frequency Continuous (stationary and portable oxygen unit needed)   Oxygen delivery system Gas      10/30/19 1128           Consultations:  None   Procedures/Studies:  DG Chest Port 1 View  Result Date: 10/25/2019 CLINICAL DATA:  Pt reports tested positive for COVID yesterday. Pt came into today due to increased SHOB, nausea, and diarrhea. Pt hypoxic on arrival at 87%. Hx of HTN and pre-diabetes. EXAM: PORTABLE CHEST 1 VIEW COMPARISON:  01/12/2018. FINDINGS: Subtle hazy infiltrates in the peripheral right upper, mid and lower lung. No left lung infiltrate is evident. No evidence of pulmonary edema. No pleural effusion or pneumothorax. Cardiac silhouette is mildly enlarged. No mediastinal or hilar masses. Skeletal structures are grossly intact. IMPRESSION: 1. Subtle infiltrates are noted in the right lung  suspicious for COVID-19 pneumonia. Left lung is clear. Electronically Signed   By: Lajean Manes M.D.   On: 10/25/2019 12:51   US Abdomen Limited RUQ  Result Date: 10/26/2019 CLINICAL DATA:  Elevated liver function studies. Previous appendectomy. EXAM: ULTRASOUND ABDOMEN LIMITED RIGHT UPPER QUADRANT COMPARISON:  Ultrasound 08/07/2019. FINDINGS: Gallbladder: Multiple gallstones are again noted. There is no gallbladder wall thickening or sonographic Murphy sign. Common bile duct: Diameter: 5 mm.  No evidence of choledocholithiasis. Liver: The hepatic echotexture is mildly heterogeneous and increased, similar to previous study. No focal lesion identified. Portal vein is patent on color Doppler imaging with normal direction of blood flow towards the liver. Other: None. IMPRESSION: 1. Cholelithiasis without evidence of cholecystitis. 2. No evidence of biliary dilatation. 3. Nonspecific mildly increased  hepatic echogenicity as before. No focal abnormality identified. Electronically Signed   By: Richardean Sale M.D.   On: 10/26/2019 17:10      Subjective: - no chest pain, shortness of breath, no abdominal pain, nausea or vomiting.   Discharge Exam: BP (!) 146/76 (BP Location: Left Arm)   Pulse (!) 56   Temp 98.5 F (36.9 C) (Oral)   Resp 19   Ht 5\' 5"  (1.651 m)   Wt 108.6 kg   SpO2 94%   BMI 39.84 kg/m   General: Pt is alert, awake, not in acute distress Cardiovascular: RRR, S1/S2 +, no rubs, no gallops Respiratory: CTA bilaterally, no wheezing, no rhonchi Abdominal: Soft, NT, ND, bowel sounds + Extremities: no edema, no cyanosis    The results of significant diagnostics from this hospitalization (including imaging, microbiology, ancillary and laboratory) are listed below for reference.     Microbiology: Recent Results (from the past 240 hour(s))  Blood Culture (routine x 2)     Status: None   Collection Time: 10/25/19 12:30 PM   Specimen: BLOOD  Result Value Ref Range Status   Specimen Description   Final    BLOOD RIGHT ANTECUBITAL Performed at [facility] [address], [place], [place] [address]    Special Requests   Final    BOTTLES DRAWN AEROBIC ONLY Blood Culture adequate volume Performed at [facility] [address], [place], [place] [address]    Culture   Final    NO GROWTH 5 DAYS Performed at [facility], [place] [address], [place], [place] [address]    Report Status 10/30/2019 FINAL  Final  Blood Culture (routine x 2)     Status: None   Collection Time: 10/25/19 12:30 PM   Specimen: BLOOD  Result Value Ref Range Status   Specimen Description   Final    BLOOD RIGHT ANTECUBITAL Performed at [facility] [address], [place], [place] [address]    Special Requests   Final    BOTTLES DRAWN AEROBIC ONLY Blood Culture adequate volume Performed at [facility] [address], [place], [place] [address]    Culture   Final    NO GROWTH 5 DAYS Performed at [facility], [place] [address] [place],  [address]    Report Status 10/30/2019 FINAL  Final     Labs: Basic Metabolic Panel: Recent Labs  Lab 10/26/19 0258 10/27/19 0401 10/28/19 0335 10/29/19 0333 10/30/19 0412  NA 129* 137 137 139 136  K 3.0* 4.8 4.4 4.1 3.9  CL 95* 102 103 103 100  CO2 24 25 25 29 27   GLUCOSE 188* 239* 245* 266* 295*  BUN 18 24* 31* 27* 29*  CREATININE 0.78 0.87 0.76 0.72 0.80  CALCIUM 7.9* 8.4* 8.5* 8.3* 8.5*  MG 2.2 2.2 2.0 2.1 2.1  PHOS 3.2 2.7 2.9 2.9 3.2   Liver Function Tests: Recent Labs  Lab 10/26/19 0258 10/27/19 0401 10/28/19 0335 10/29/19 0333 10/30/19 0412  AST 83* 70* 52* 50* 60*  ALT 67* 58* 51* 53* 76*  ALKPHOS 85 76 77 67 75  BILITOT 2.4* 1.3* 1.0 1.5* 1.3*  PROT 6.6 6.4* 6.4* 5.9* 6.4*  ALBUMIN 3.1* 2.9* 2.9* 3.0* 3.2*   CBC: Recent Labs  Lab 10/26/19 0258 10/27/19 0401 10/28/19 0335 10/29/19 0333 10/30/19 0412  WBC 1.7* 4.2 4.0 2.9* 2.3*  NEUTROABS 1.1* 3.4 3.3 2.3 1.8  HGB 12.3 12.5 12.5 12.1 13.1  HCT 38.0 39.2 39.6 38.2 41.6  MCV 99.5 98.2 100.0 97.9 98.3  PLT 91* 112* 127* 122* 117*   CBG: Recent Labs  Lab 10/29/19 0829 10/29/19 1201 10/29/19 1607 10/29/19 2034 10/30/19 0733  GLUCAP 174* 224* 254* 321* 237*   Hgb A1c No results for input(s): HGBA1C in the last 72 hours. Lipid Profile No results for input(s): CHOL, HDL, LDLCALC, TRIG, CHOLHDL, LDLDIRECT in the last 72 hours. Thyroid function studies No results for input(s): TSH, T4TOTAL, T3FREE, THYROIDAB in the last 72 hours.  Invalid input(s): FREET3 Urinalysis    Component Value Date/Time   COLORURINE YELLOW 09/13/2018 [place] 09/13/2018 1244   LABSPEC 1.010 09/13/2018 1244   PHURINE 6.0 09/13/2018 [place] 09/13/2018 [place] NEGATIVE 09/13/2018 [place] NEGATIVE 09/13/2018 [place] 09/13/2018 1244   PROTEINUR NEGATIVE 09/13/2018 1244   UROBILINOGEN 1.0 08/06/2012 2046   NITRITE NEGATIVE 09/13/2018 1244   LEUKOCYTESUR TRACE (A) 09/13/2018 1244    FURTHER DISCHARGE INSTRUCTIONS:   Get Medicines reviewed and adjusted: Please take all your medications with you for your next visit with your Primary MD   Laboratory/radiological data: Please request your Primary MD to go over all hospital tests and procedure/radiological results at the follow up, please ask your Primary MD to get all Hospital records sent to his/her office.   In some cases, they will be blood work, cultures and biopsy results pending at the time of your discharge. Please request that your primary care M.D. goes through all the records of your hospital data and follows up on these results.   Also Note the following: If you experience worsening of your admission symptoms, develop shortness of breath, life threatening emergency, suicidal or homicidal thoughts you must seek medical attention immediately by calling 911 or calling your MD immediately  if symptoms less severe.   You must read complete instructions/literature along with all the possible adverse reactions/side effects for all the Medicines you take and that have been prescribed to you. Take any new Medicines after you have completely understood and accpet all the possible adverse reactions/side effects.    Do not drive when taking Pain medications or sleeping medications (Benzodaizepines)   Do not take more than prescribed Pain, Sleep and Anxiety Medications. It is not advisable to combine anxiety,sleep and pain medications without talking with your primary care practitioner   Special Instructions: If you have smoked or chewed Tobacco  in the last 2 yrs please stop smoking, stop any regular Alcohol  and or any Recreational drug use.   Wear Seat belts while driving.   Please note: You were cared for by a hospitalist during  your  hospital stay. Once you are discharged, your primary care physician will handle any further medical issues. Please note that NO REFILLS for any discharge medications will be authorized once you are discharged, as it is imperative that you return to your primary care physician (or establish a relationship with a primary care physician if you do not have one) for your post hospital discharge needs so that they can reassess your need for medications and monitor your lab values.  Time coordinating discharge: 40 minutes  SIGNED:  Marzetta Board, MD, PhD 10/30/2019, 11:28 AM

## 2019-10-30 NOTE — Progress Notes (Signed)
Pt"s saturation 90%-93% on 3 liters. During ambulation (going to the bathroom) saturation around 87%-89% on RA.Unable to wean Oxygen at this time.

## 2019-10-30 NOTE — Progress Notes (Signed)
SATURATION QUALIFICATIONS: (This note is used to comply with regulatory documentation for home oxygen)  Patient Saturations on Room Air at Rest = 88%  Patient Saturations on Room Air while Ambulating = 83%  Patient Saturations on 2Liters of oxygen while Ambulating = 93%  Please briefly explain why patient needs home oxygen: Pt with desaturations on room air with ambulation

## 2019-11-27 ENCOUNTER — Other Ambulatory Visit: Payer: Self-pay

## 2019-11-27 ENCOUNTER — Ambulatory Visit: Payer: Medicare Other | Admitting: Podiatry

## 2019-11-27 ENCOUNTER — Encounter: Payer: Self-pay | Admitting: Podiatry

## 2019-11-27 DIAGNOSIS — M79676 Pain in unspecified toe(s): Secondary | ICD-10-CM | POA: Diagnosis not present

## 2019-11-27 DIAGNOSIS — B351 Tinea unguium: Secondary | ICD-10-CM

## 2019-11-27 NOTE — Progress Notes (Signed)
Complaint:  Visit Type: Patient returns to my office for continued preventative foot care services. Complaint: Patient states" my nails have grown long and thick and become painful to walk and wear shoes" Patient has been diagnosed with DM with no foot complications. The patient presents for preventative foot care services. Patient has history of hammer toe surgery.  Podiatric Exam: Vascular: dorsalis pedis and posterior tibial pulses are palpable bilateral. Capillary return is immediate. Temperature gradient is WNL. Skin turgor WNL  Sensorium: Normal Semmes Weinstein monofilament test. Normal tactile sensation bilaterally. Nail Exam: Pt has thick disfigured discolored nails with subungual debris noted bilateral entire nail hallux through fifth toenails Ulcer Exam: There is no evidence of ulcer or pre-ulcerative changes or infection. Orthopedic Exam: Muscle tone and strength are WNL. No limitations in general ROM. No crepitus or effusions noted. Foot type and digits show no abnormalities. HAV  B/L.  S/P hammer toe surgery. Skin: No Porokeratosis. No infection or ulcers.  Diffuse callus  Sub 1  B/L.  Peeling right heel.  Diagnosis:  Onychomycosis, , Pain in right toe, pain in left toes  Treatment & Plan Procedures and Treatment: Consent by patient was obtained for treatment procedures.   Debridement of mycotic and hypertrophic toenails, 1 through 5 bilateral and clearing of subungual debris. No ulceration, no infection noted. Cauterization iatrogenic lesion right hallux. Return Visit-Office Procedure: Patient instructed to return to the office for a follow up visit 3 months for continued evaluation and treatment.    Gardiner Barefoot DPM

## 2020-02-27 ENCOUNTER — Ambulatory Visit: Payer: Medicare Other | Admitting: Podiatry

## 2020-05-05 ENCOUNTER — Encounter: Payer: Self-pay | Admitting: Podiatry

## 2020-05-05 ENCOUNTER — Ambulatory Visit: Payer: Medicare Other | Admitting: Podiatry

## 2020-05-05 ENCOUNTER — Other Ambulatory Visit: Payer: Self-pay

## 2020-05-05 DIAGNOSIS — M2011 Hallux valgus (acquired), right foot: Secondary | ICD-10-CM

## 2020-05-05 DIAGNOSIS — M79676 Pain in unspecified toe(s): Secondary | ICD-10-CM | POA: Diagnosis not present

## 2020-05-05 DIAGNOSIS — M79675 Pain in left toe(s): Secondary | ICD-10-CM

## 2020-05-05 DIAGNOSIS — M2041 Other hammer toe(s) (acquired), right foot: Secondary | ICD-10-CM | POA: Diagnosis not present

## 2020-05-05 DIAGNOSIS — B351 Tinea unguium: Secondary | ICD-10-CM

## 2020-05-05 DIAGNOSIS — L84 Corns and callosities: Secondary | ICD-10-CM

## 2020-05-05 DIAGNOSIS — M2042 Other hammer toe(s) (acquired), left foot: Secondary | ICD-10-CM | POA: Diagnosis not present

## 2020-05-05 DIAGNOSIS — M2012 Hallux valgus (acquired), left foot: Secondary | ICD-10-CM

## 2020-05-05 NOTE — Patient Instructions (Signed)
Diabetes Mellitus and Foot Care Foot care is an important part of your health, especially when you have diabetes. Diabetes may cause you to have problems because of poor blood flow (circulation) to your feet and legs, which can cause your skin to:  Become thinner and drier.  Break more easily.  Heal more slowly.  Peel and crack. You may also have nerve damage (neuropathy) in your legs and feet, causing decreased feeling in them. This means that you may not notice minor injuries to your feet that could lead to more serious problems. Noticing and addressing any potential problems early is the best way to prevent future foot problems. How to care for your feet Foot hygiene  Wash your feet daily with warm water and mild soap. Do not use hot water. Then, pat your feet and the areas between your toes until they are completely dry. Do not soak your feet as this can dry your skin.  Trim your toenails straight across. Do not dig under them or around the cuticle. File the edges of your nails with an emery board or nail file.  Apply a moisturizing lotion or petroleum jelly to the skin on your feet and to dry, brittle toenails. Use lotion that does not contain alcohol and is unscented. Do not apply lotion between your toes. Shoes and socks  Wear clean socks or stockings every day. Make sure they are not too tight. Do not wear knee-high stockings since they may decrease blood flow to your legs.  Wear shoes that fit properly and have enough cushioning. Always look in your shoes before you put them on to be sure there are no objects inside.  To break in new shoes, wear them for just a few hours a day. This prevents injuries on your feet. Wounds, scrapes, corns, and calluses  Check your feet daily for blisters, cuts, bruises, sores, and redness. If you cannot see the bottom of your feet, use a mirror or ask someone for help.  Do not cut corns or calluses or try to remove them with medicine.  If you  find a minor scrape, cut, or break in the skin on your feet, keep it and the skin around it clean and dry. You may clean these areas with mild soap and water. Do not clean the area with peroxide, alcohol, or iodine.  If you have a wound, scrape, corn, or callus on your foot, look at it several times a day to make sure it is healing and not infected. Check for: ? Redness, swelling, or pain. ? Fluid or blood. ? Warmth. ? Pus or a bad smell. General instructions  Do not cross your legs. This may decrease blood flow to your feet.  Do not use heating pads or hot water bottles on your feet. They may burn your skin. If you have lost feeling in your feet or legs, you may not know this is happening until it is too late.  Protect your feet from hot and cold by wearing shoes, such as at the beach or on hot pavement.  Schedule a complete foot exam at least once a year (annually) or more often if you have foot problems. If you have foot problems, report any cuts, sores, or bruises to your health care provider immediately. Contact a health care provider if:  You have a medical condition that increases your risk of infection and you have any cuts, sores, or bruises on your feet.  You have an injury that is not   healing.  You have redness on your legs or feet.  You feel burning or tingling in your legs or feet.  You have pain or cramps in your legs and feet.  Your legs or feet are numb.  Your feet always feel cold.  You have pain around a toenail. Get help right away if:  You have a wound, scrape, corn, or callus on your foot and: ? You have pain, swelling, or redness that gets worse. ? You have fluid or blood coming from the wound, scrape, corn, or callus. ? Your wound, scrape, corn, or callus feels warm to the touch. ? You have pus or a bad smell coming from the wound, scrape, corn, or callus. ? You have a fever. ? You have a red line going up your leg. Summary  Check your feet every day  for cuts, sores, red spots, swelling, and blisters.  Moisturize feet and legs daily.  Wear shoes that fit properly and have enough cushioning.  If you have foot problems, report any cuts, sores, or bruises to your health care provider immediately.  Schedule a complete foot exam at least once a year (annually) or more often if you have foot problems. This information is not intended to replace advice given to you by your health care provider. Make sure you discuss any questions you have with your health care provider. Document Revised: 07/18/2019 Document Reviewed: 11/26/2016 [employer] Patient Education  2020 [employer]  

## 2020-05-09 NOTE — Progress Notes (Addendum)
Subjective: Kathleen Wiley is a pleasant 74 y.o. female patient seen today painful thick toenails that are difficult to trim. Pain interferes with ambulation. Aggravating factors include wearing enclosed shoe gear. Pain is relieved with periodic professional debridement.   Patient has questions regarding pedicures.   Past Medical History:  Diagnosis Date  . Anxiety   . Arthritis    knee, hip  . Cellulitis 08/2016   left leg  . Complication of anesthesia   . Dysrhythmia    Afib due to Flagyl- 7 -2007- not problems since  . GERD (gastroesophageal reflux disease)   . Heart murmur    "slight "  informed years ago.-   . Hernia, umbilical   . History of atrial fibrillation 2007   with flagyl  . History of endometriosis   . Hypercholesteremia   . Hypertension    has not been to a cardiologist  . PONV (postoperative nausea and vomiting)   . Pre-diabetes   . Rectal vaginal fistula   . Refusal of blood transfusions as patient is Jehovah's Witness     Patient Active Problem List   Diagnosis Date Noted  . Pneumonia due to COVID-19 virus 10/25/2019  . Hypokalemia 10/25/2019  . Hyperbilirubinemia 10/25/2019  . Pre-diabetes   . History of total hip arthroplasty, right 09/18/2018  . Osteoarthritis of right hip 09/14/2018  . Morbid obesity, BMI not known ([place]) 09/17/2016  . Stasis ulcer ([place]) 09/17/2016  . 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 Cellulitis of left lower leg 08/10/2016  . Infection of flexor tendon sheath 06/14/2016  . Pain of right thumb 06/14/2016  . GERD (gastroesophageal reflux disease) 02/09/2013  . Essential hypertension, benign 02/09/2013  . Hypercholesterolemia 02/09/2013  . Unspecified constipation 02/09/2013  . Muscle spasm 02/09/2013  . Osteoarthritis of right knee 01/26/2013  . Osteoarthritis of left hip 05/10/2012    Current Outpatient Medications on File Prior to Visit  Medication Sig Dispense  Refill  . ascorbic acid (VITAMIN C) 500 MG tablet Take 1 tablet (500 mg total) by mouth daily. 5 tablet 0  . atenolol-chlorthalidone (TENORETIC) 50-25 MG tablet Take 1 tablet by mouth daily.    . Cholecalciferol (VITAMIN D) 50 MCG (2000 UT) tablet Take 2,000 Units by mouth daily.     . cyanocobalamin 1000 MCG tablet Take 1,000 mcg by mouth daily.     Marland Kitchen dexamethasone (DECADRON) 6 MG tablet Take 1 tablet (6 mg total) by mouth daily. 5 tablet 0  . diphenhydrAMINE (BENADRYL) 25 MG tablet Take 25 mg by mouth at bedtime.    Marland Kitchen escitalopram (LEXAPRO) 10 MG tablet Take 10 mg by mouth daily.    . fluticasone (FLONASE) 50 MCG/ACT nasal spray Place 2 sprays into both nostrils daily as needed for allergies.     . furosemide (LASIX) 20 MG tablet Take 20 mg by mouth 3 (three) times a week.     . gabapentin (NEURONTIN) 300 MG capsule Take 300 mg by mouth at bedtime.     Marland Kitchen gemfibrozil (LOPID) 600 MG tablet Take 600 mg by mouth 2 (two) times daily before a meal.    . guaiFENesin-dextromethorphan (ROBITUSSIN DM) 100-10 MG/5ML syrup Take 10 mLs by mouth every 4 (four) hours as needed for cough. 118 mL 0  . omeprazole (PRILOSEC) 20 MG capsule Take 20 mg by mouth daily.    Marland Kitchen OZEMPIC, 0.25 OR 0.5 MG/DOSE, 2 MG/1.5ML SOPN Inject 0.5 mg into the skin once a  week.     . Potassium 99 MG TABS Take 99 mg by mouth daily.    Marland Kitchen zinc sulfate 220 (50 Zn) MG capsule Take 1 capsule (220 mg total) by mouth daily. 5 capsule 0   No current facility-administered medications on file prior to visit.    Allergies  Allergen Reactions  . Other     Refuses whole blood but does accept albumin  . Metronidazole Other (See Comments)    Chest pain, A-fib   . Septra [Sulfamethoxazole-Trimethoprim] Other (See Comments)    Blisters     Objective: Physical Exam  General: Kathleen Wiley is a pleasant 75 y.o. Caucasian female, in NAD. AAO x 3.   Vascular:  Neurovascular status unchanged b/l lower extremities. Capillary refill time  to digits immediate b/l. Palpable pedal pulses b/l LE. Pedal hair sparse. Lower extremity skin temperature gradient within normal limits. Nonpitting edema noted b/l lower extremities.  Dermatological:  Pedal skin with normal turgor, texture and tone bilaterally. No open wounds bilaterally. No interdigital macerations bilaterally. Toenails 1-5 b/l elongated, discolored, dystrophic, thickened, crumbly with subungual debris and tenderness to dorsal palpation. Incurvated nailplate medial border(s) L hallux.  Nail border hypertrophy not present. There is tenderness to palpation. Sign(s) of infection: no clinical signs of infection noted on examination today. Hyperkeratotic lesion(s) L 2nd toe and L 3rd toe.  No erythema, no edema, no drainage, no flocculence.  Healing scabs noted on right 2nd, 3rd digits.  Musculoskeletal:  Normal muscle strength 5/5 to all lower extremity muscle groups bilaterally. No pain crepitus or joint limitation noted with ROM b/l. Hallux valgus with bunion deformity noted b/l lower extremities. Hammertoes noted to the 2-5 bilaterally.  Neurological:  Protective sensation intact 5/5 intact bilaterally with 10g monofilament b/l. Vibratory sensation intact b/l. Proprioception intact bilaterally. Clonus negative b/l.  Assessment and Plan:  1. Pain due to onychomycosis of toenail   2. Corns   3. Pain in left toe(s)   4. Hallux valgus, acquired, bilateral   5. Acquired hammertoes of both feet    -Examined patient. -Patient counseled regarding pedicures. For infection control, advised she take her own instruments and nail polish. She related understanding. -Toenails 1-5 b/l were debrided in length and girth with sterile nail nippers and dremel without iatrogenic bleeding.  -Offending nail border debrided and curretaged L hallux utilizing sterile nail nipper and currette. Border cleansed with alcohol and triple antibiotic applied. No further treatment required by  patient/caregiver. -Corn(s) L 2nd toe and L 3rd toe pared utilizing sterile scalpel blade without complication or incident. Total number debrided=2. -Patient to report any pedal injuries to medical professional immediately. -Patient to continue soft, supportive shoe gear daily. -Patient/POA to call should there be question/concern in the interim.  Return in about 3 months (around 08/05/2020) for diabetic nail trim.  Marzetta Board, DPM

## 2020-05-28 ENCOUNTER — Other Ambulatory Visit: Payer: Self-pay | Admitting: Family Medicine

## 2020-05-28 DIAGNOSIS — Z1231 Encounter for screening mammogram for malignant neoplasm of breast: Secondary | ICD-10-CM

## 2020-06-19 ENCOUNTER — Ambulatory Visit
Admission: RE | Admit: 2020-06-19 | Discharge: 2020-06-19 | Disposition: A | Discharge status: Home / Self Care | Payer: Medicare Other | Source: Ambulatory Visit | Attending: Family Medicine | Admitting: Family Medicine

## 2020-06-19 ENCOUNTER — Other Ambulatory Visit: Payer: Self-pay

## 2020-06-19 DIAGNOSIS — Z1231 Encounter for screening mammogram for malignant neoplasm of breast: Secondary | ICD-10-CM

## 2020-06-19 IMAGING — MG DIGITAL SCREENING BILAT W/ TOMO W/ CAD
8 series · 8 of 24 positions shown · non-contrast
Comparison: Previous exam(s).

CLINICAL DATA: Screening.

EXAM:
DIGITAL SCREENING BILATERAL MAMMOGRAM WITH TOMO AND CAD

[R CC synth-2D]
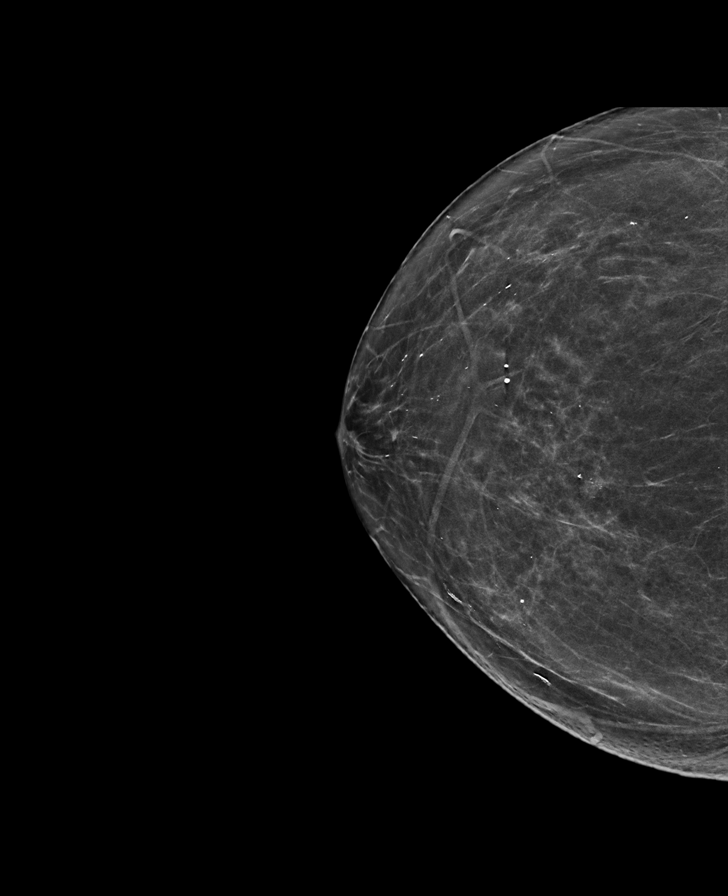

[L CC synth-2D]
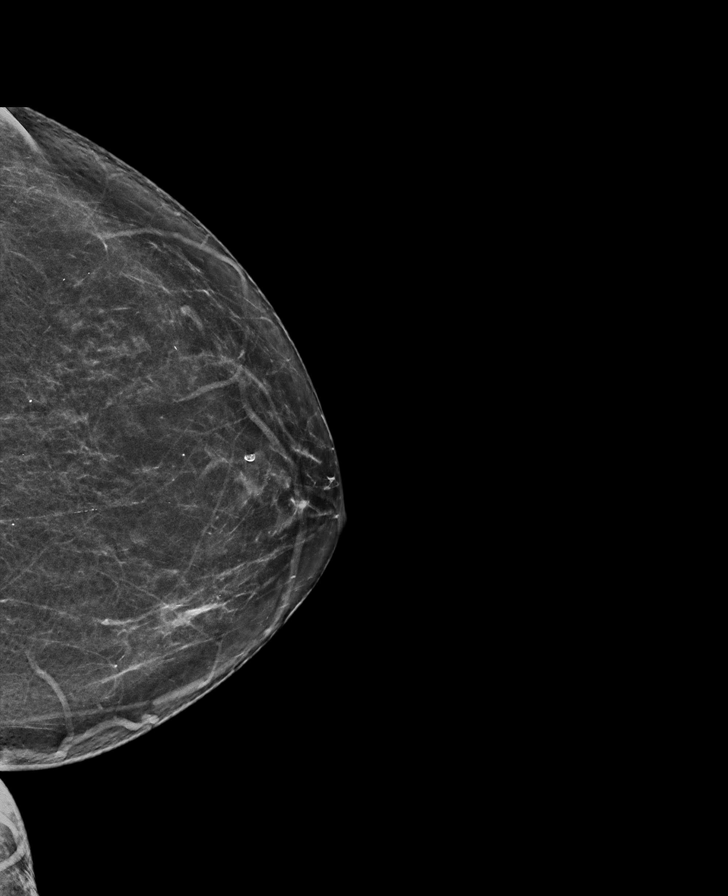

[L MLO synth-2D]
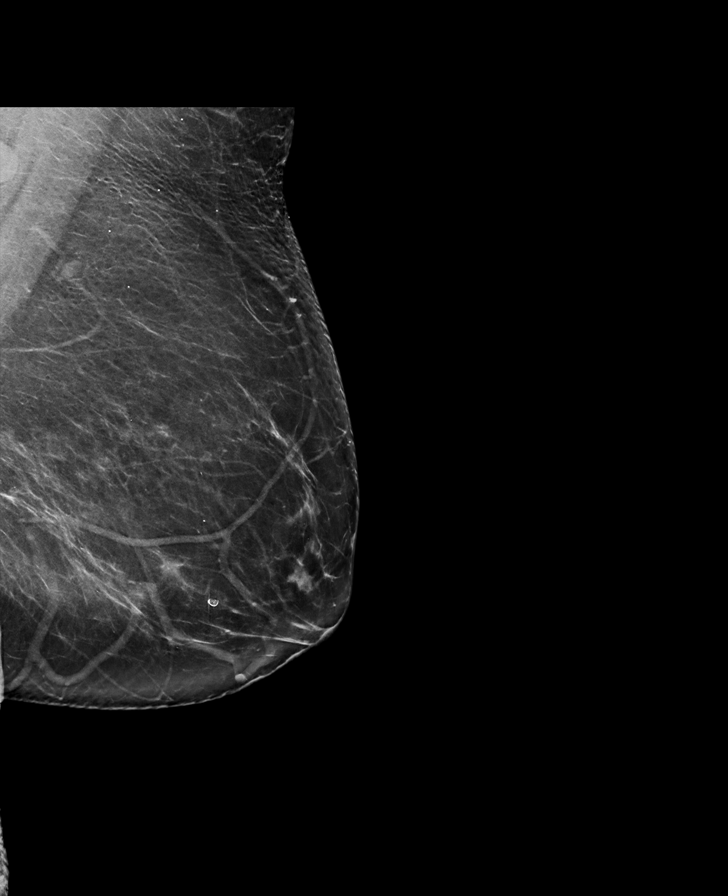

[R MLO synth-2D]
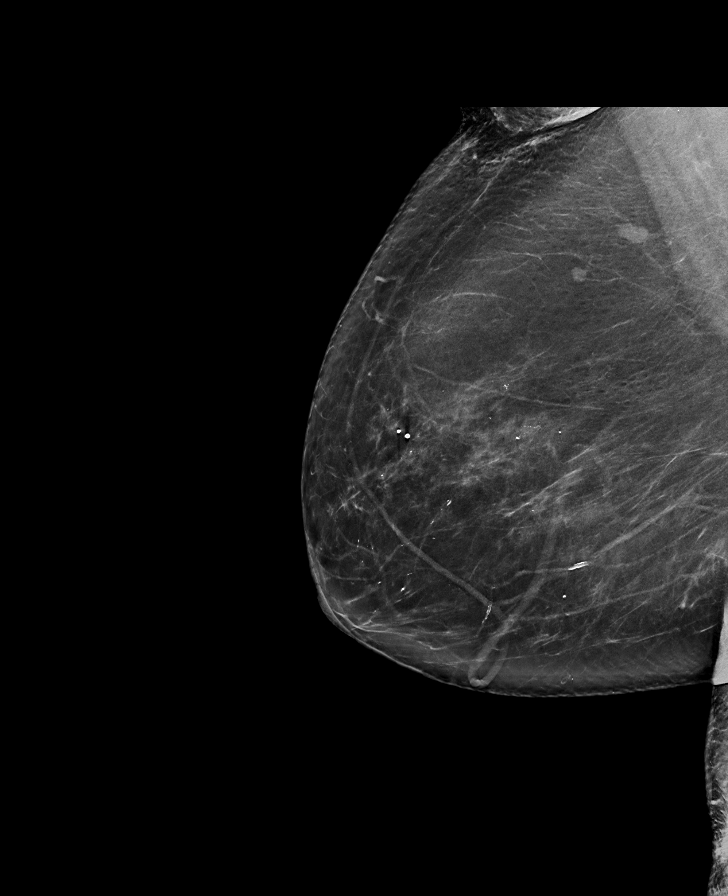

[L MLO tomo · tomo slice 42/83.0]
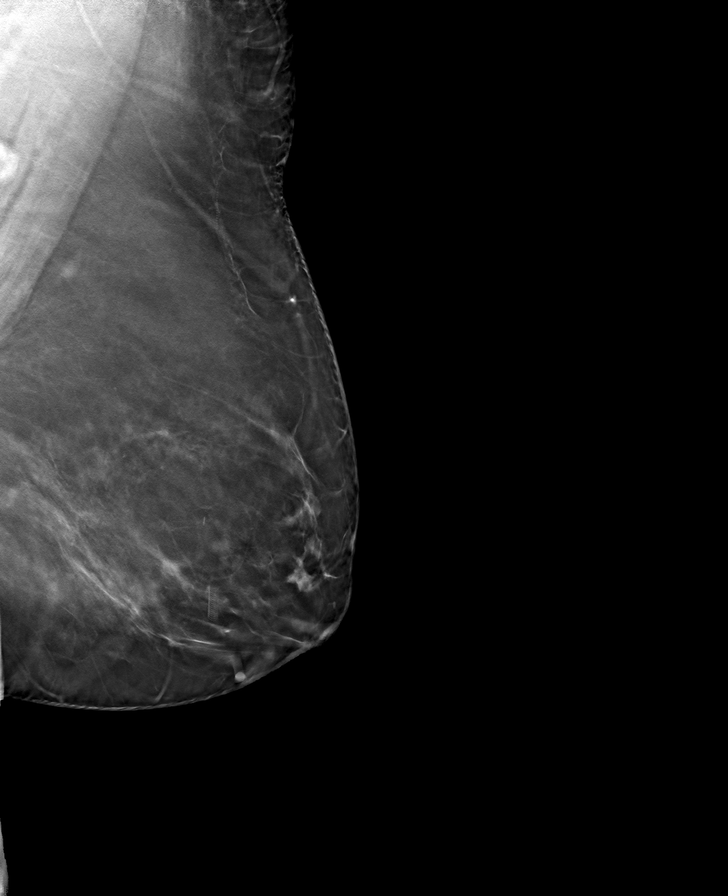

[R MLO tomo · tomo slice 45/88.0]
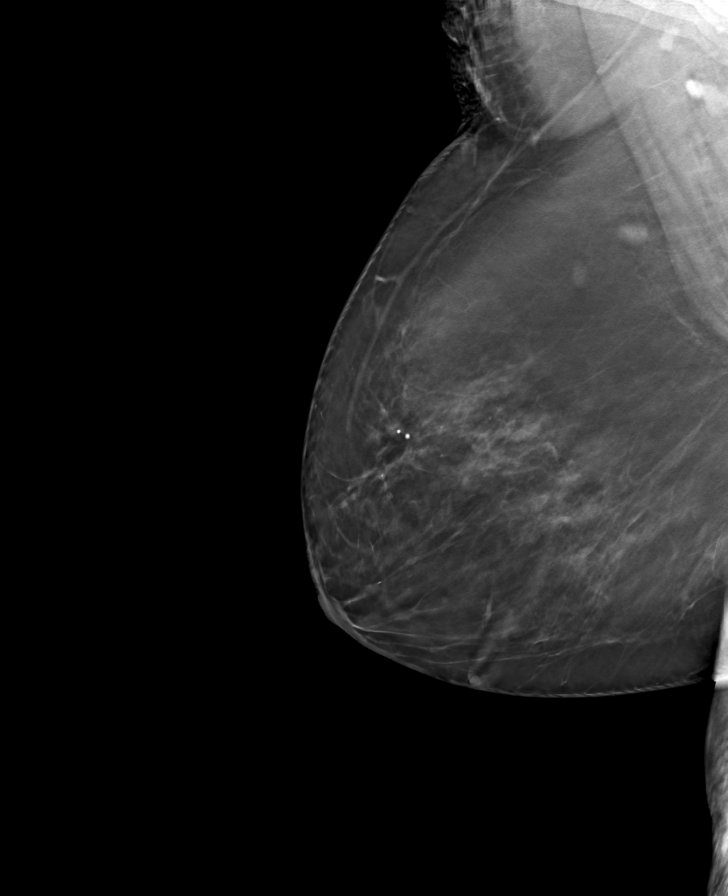

[L CC tomo · tomo slice 35/68.0]
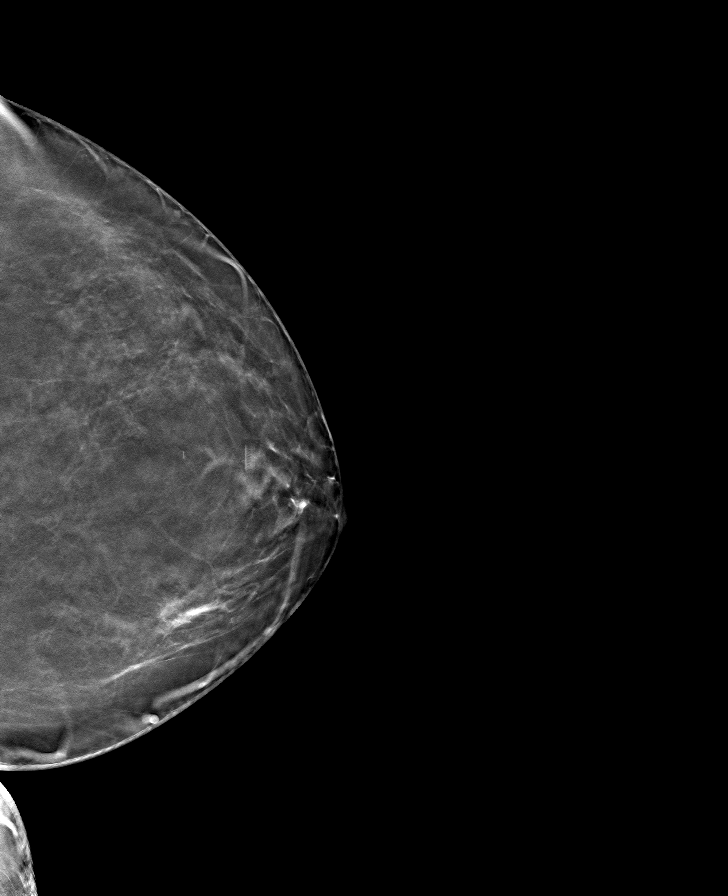

[R CC tomo · tomo slice 33/66.0]
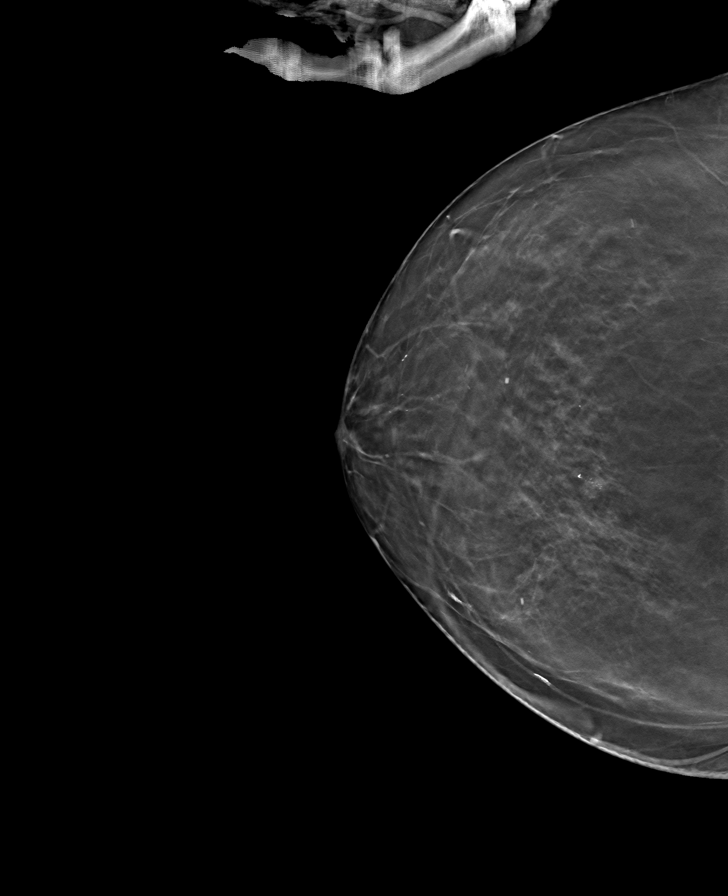

[8 of 24 positions shown; findings below may reference images not displayed]

ACR Breast Density Category b: There are scattered areas of
fibroglandular density.
FINDINGS: In the right breast, calcifications warrant further evaluation with
magnified views. In the left breast, no findings suspicious for
malignancy. Images were processed with CAD.
IMPRESSION: Further evaluation is suggested for calcifications in the right
breast.

RECOMMENDATION:
Diagnostic mammogram of the right breast. (Code:[8P])

The patient will be contacted regarding the findings, and additional
imaging will be scheduled.

BI-RADS CATEGORY  0: Incomplete. Need additional imaging evaluation
and/or prior mammograms for comparison.

## 2020-06-23 ENCOUNTER — Other Ambulatory Visit: Payer: Self-pay | Admitting: Family Medicine

## 2020-06-23 DIAGNOSIS — R928 Other abnormal and inconclusive findings on diagnostic imaging of breast: Secondary | ICD-10-CM

## 2020-07-09 ENCOUNTER — Other Ambulatory Visit: Payer: Self-pay | Admitting: Family Medicine

## 2020-07-09 ENCOUNTER — Ambulatory Visit
Admission: RE | Admit: 2020-07-09 | Discharge: 2020-07-09 | Disposition: A | Discharge status: Home / Self Care | Payer: Medicare Other | Source: Ambulatory Visit | Attending: Family Medicine | Admitting: Family Medicine

## 2020-07-09 ENCOUNTER — Other Ambulatory Visit: Payer: Self-pay

## 2020-07-09 DIAGNOSIS — R921 Mammographic calcification found on diagnostic imaging of breast: Secondary | ICD-10-CM

## 2020-07-09 DIAGNOSIS — R928 Other abnormal and inconclusive findings on diagnostic imaging of breast: Secondary | ICD-10-CM

## 2020-07-09 IMAGING — MG DIGITAL DIAGNOSTIC UNILAT RIGHT W/ CAD
3 series · 3 of 3 positions shown · non-contrast
Comparison: Previous exam(s).

CLINICAL DATA: Calcifications in the 12 o'clock region of the right
breast on a recent screening mammogram.

EXAM:
DIGITAL DIAGNOSTIC RIGHT MAMMOGRAM WITH CAD

[R ML (1 of 2)]
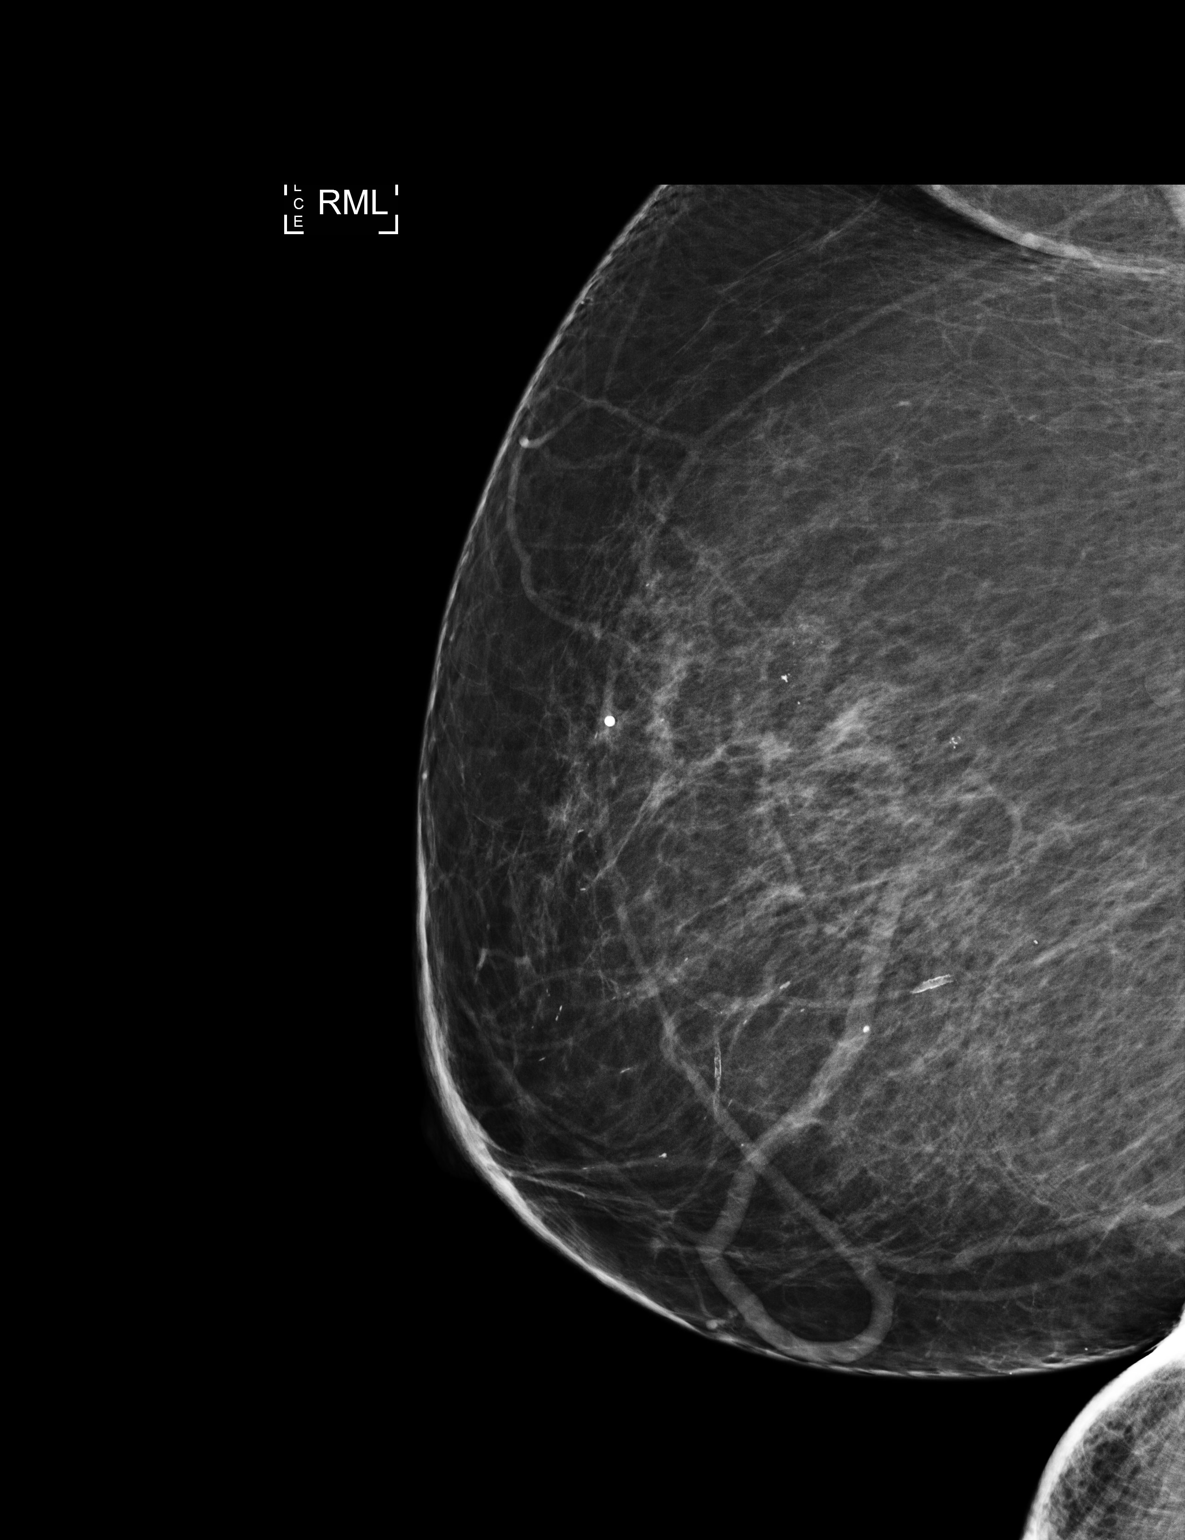

[R CC]
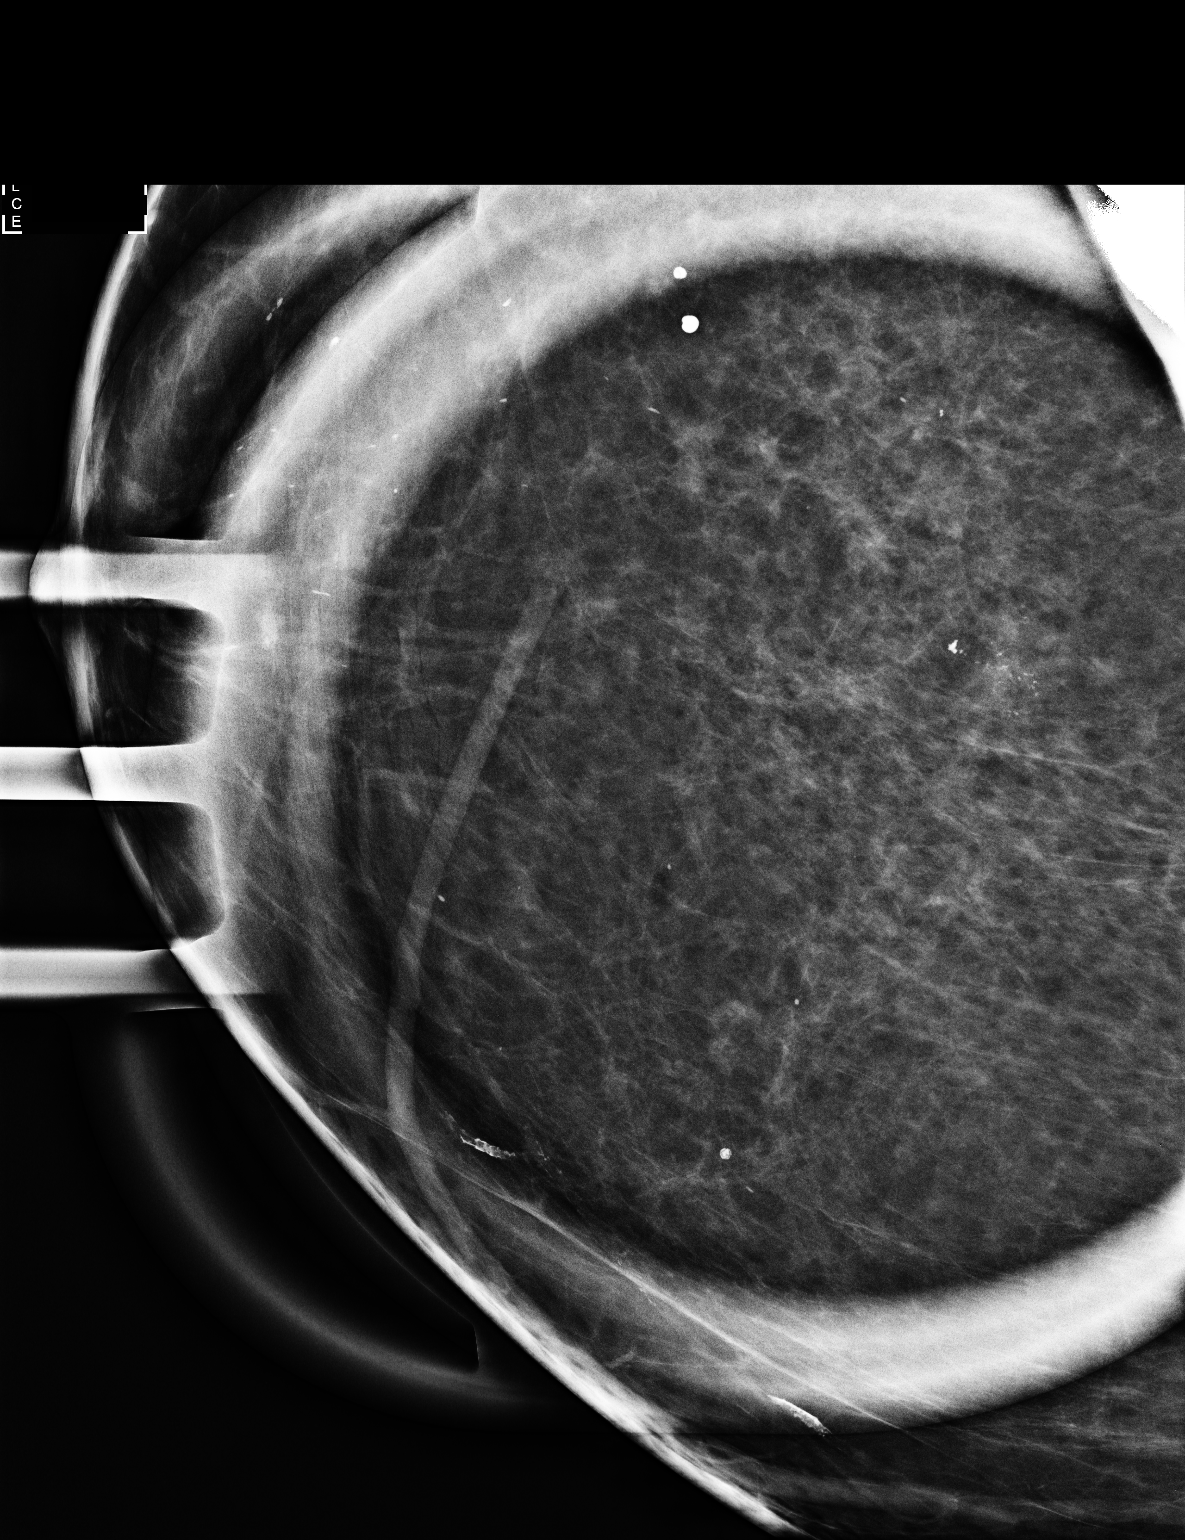

[R ML (2 of 2)]
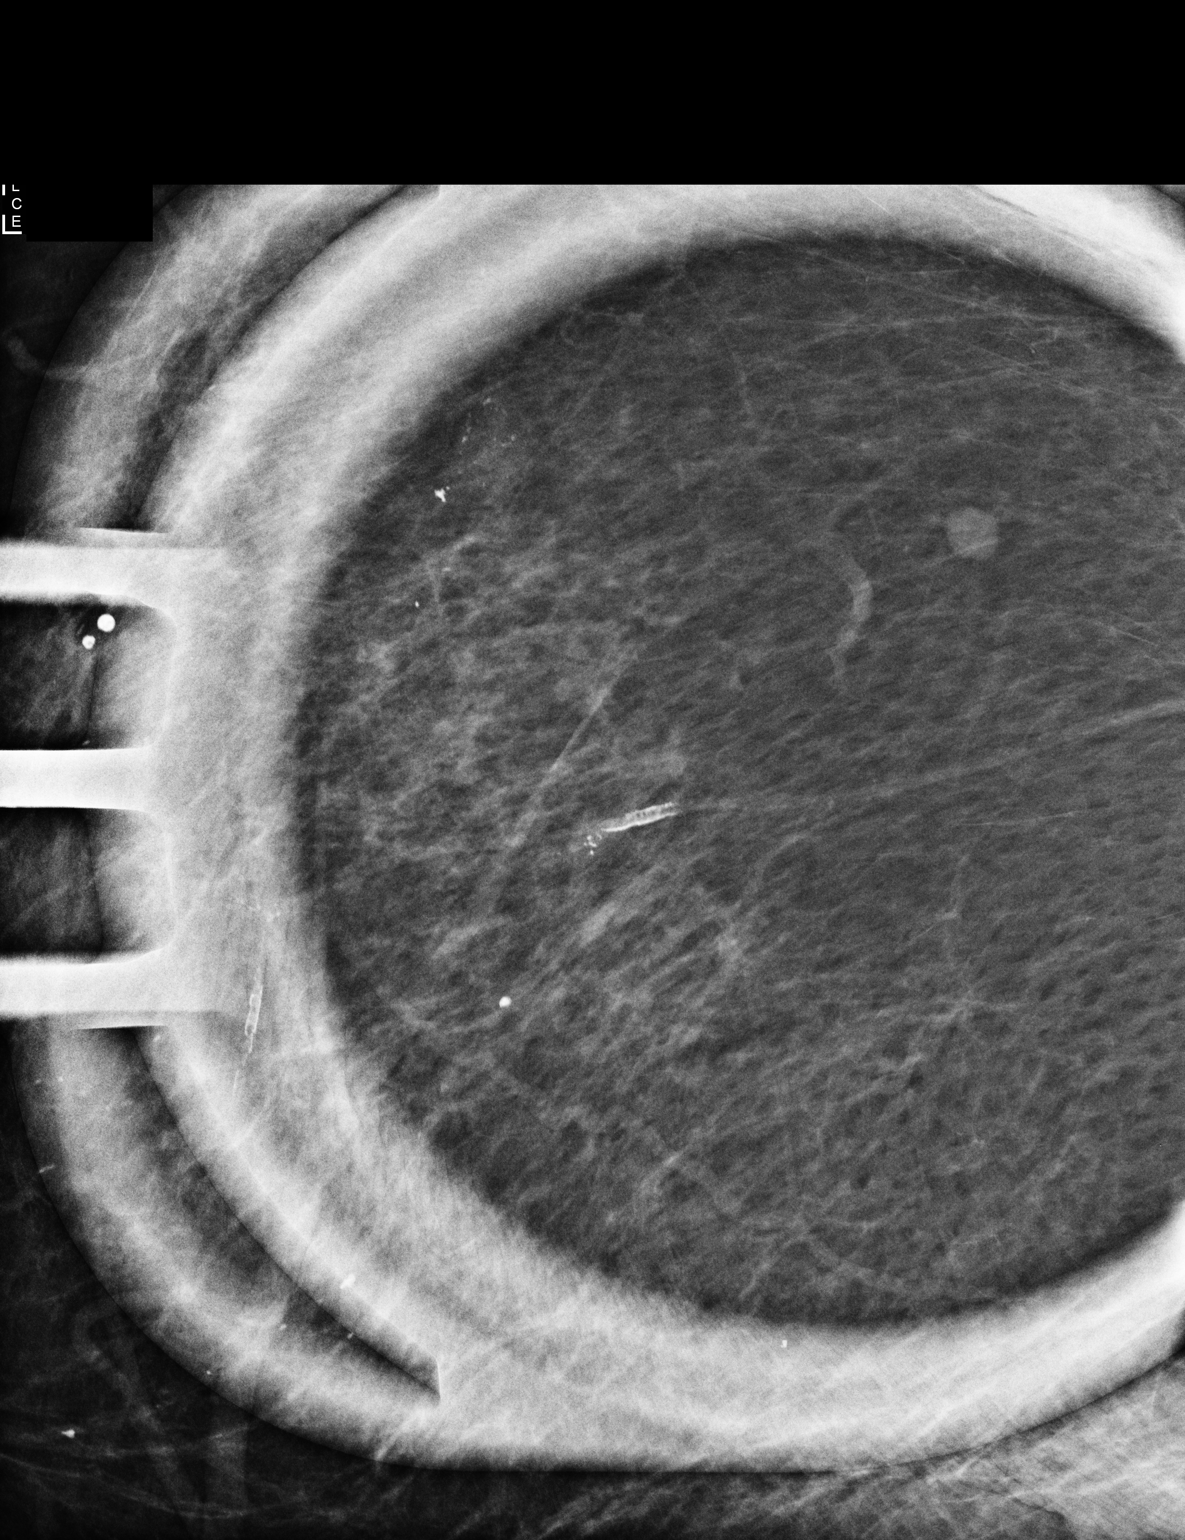

[3 of 3 positions shown; findings below may reference images not displayed]

ACR Breast Density Category b: There are scattered areas of
fibroglandular density.
FINDINGS: 2D true lateral and spot magnification views of the right breast a
demonstrate a 1.0 x 1.0 x 0.6 cm group of a large number of tiny
calcifications in the 12 o'clock position of the right breast. These
vary slightly in size, shape and density with no dependent layering
visualized.

Mammographic images were processed with CAD.
IMPRESSION: 1.0 cm group of indeterminate calcifications in the 12 o'clock
position of the right breast.

RECOMMENDATION:
3D stereotactic guided core needle biopsy of the 1.0 cm group of
calcifications in the 12 o'clock position of the right breast. This
has been discussed with the patient and scheduled at [DATE] a.m. on
[DATE].

I have discussed the findings and recommendations with the patient.
If applicable, a reminder letter will be sent to the patient
regarding the next appointment.

BI-RADS CATEGORY  4: Suspicious.

## 2020-07-22 ENCOUNTER — Other Ambulatory Visit: Payer: Self-pay

## 2020-07-22 ENCOUNTER — Ambulatory Visit
Admission: RE | Admit: 2020-07-22 | Discharge: 2020-07-22 | Disposition: A | Discharge status: Home / Self Care | Payer: Medicare Other | Source: Ambulatory Visit | Attending: Family Medicine | Admitting: Family Medicine

## 2020-07-22 DIAGNOSIS — R921 Mammographic calcification found on diagnostic imaging of breast: Secondary | ICD-10-CM

## 2020-07-22 IMAGING — MG MM BREAST BX W/ LOC DEV 1ST LESION IMAGE BX SPEC STEREO GUIDE*R*
8 of 10 series · 8 of 22 positions shown · non-contrast
Comparison: Previous exams.
COMPARISON: Previous exams.

Addendum:
CLINICAL DATA: Patient presents for stereotactic guided core biopsy
of RIGHT breast calcifications.

EXAM:
RIGHT BREAST STEREOTACTIC CORE NEEDLE BIOPSY

[R (1 of 4)]
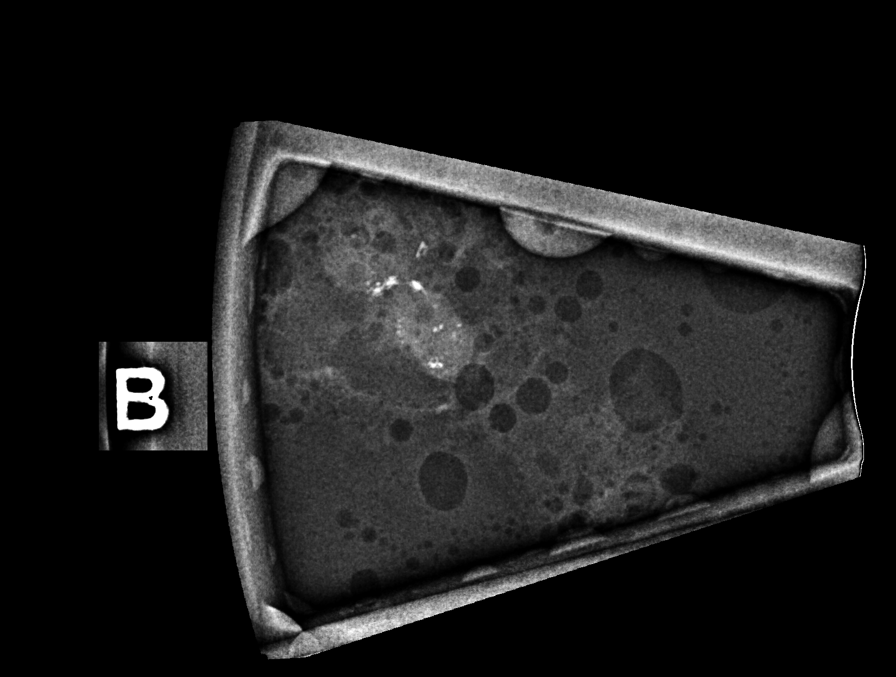

[R (2 of 4)]
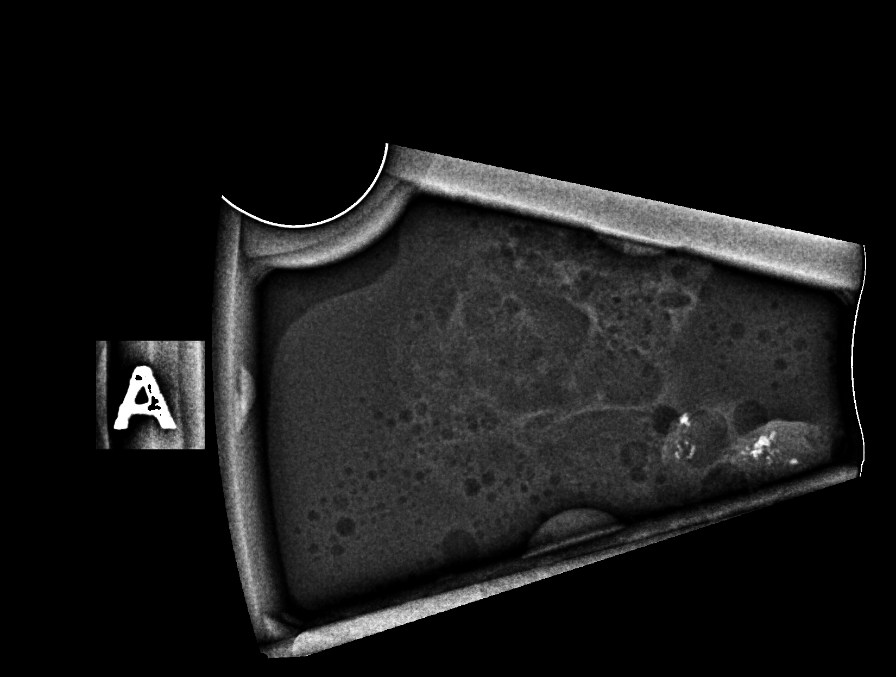

[R (3 of 4)]
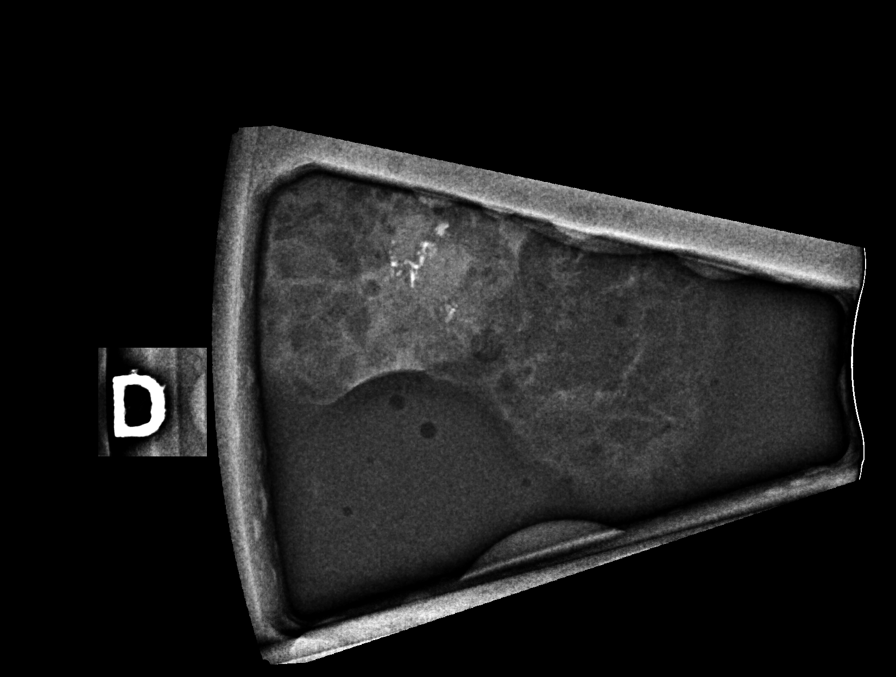

[R (4 of 4)]
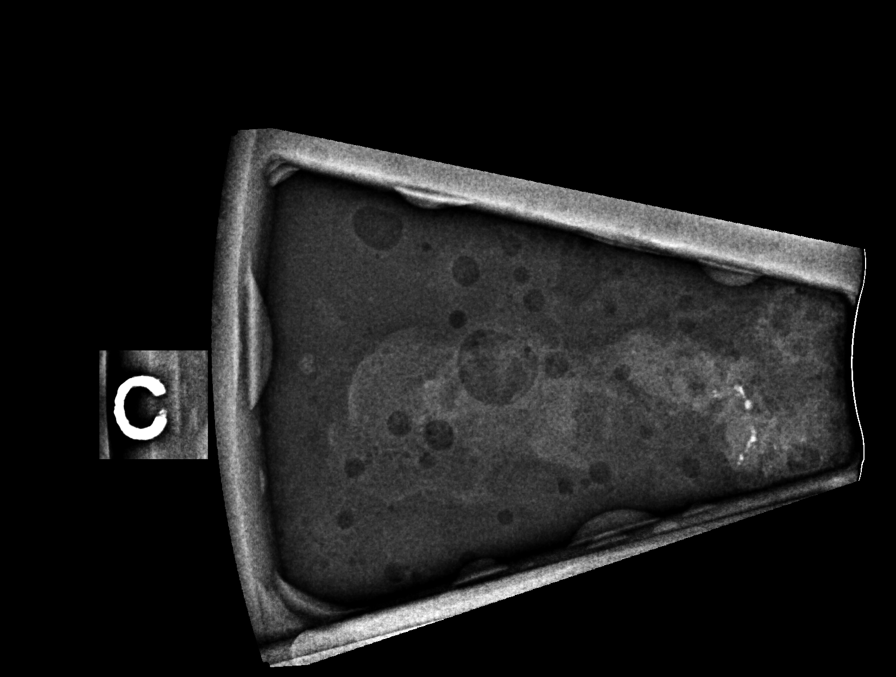

[R CC (1 of 3)]
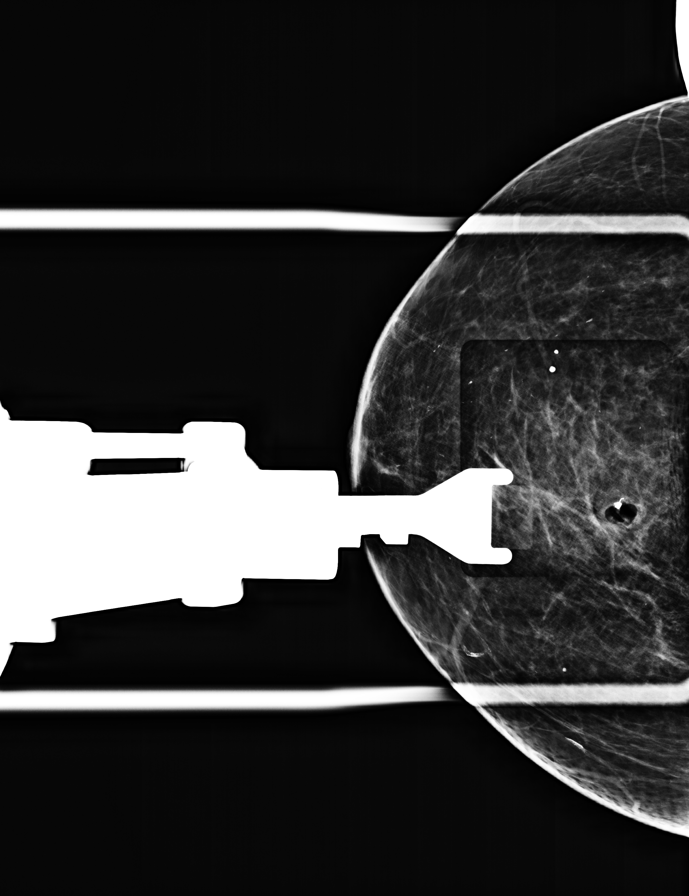

[R CC (2 of 3)]
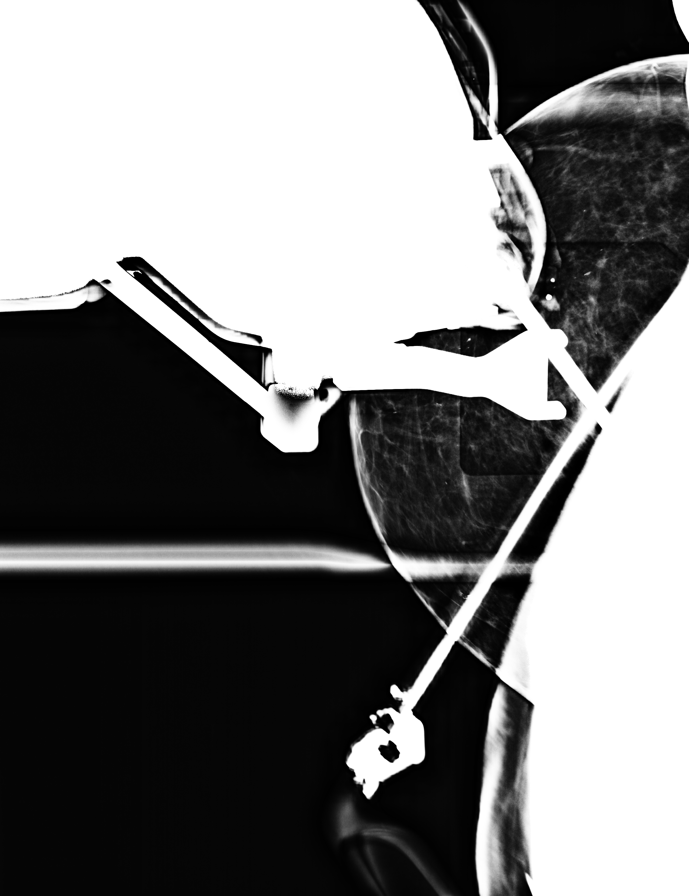

[R CC (3 of 3)]
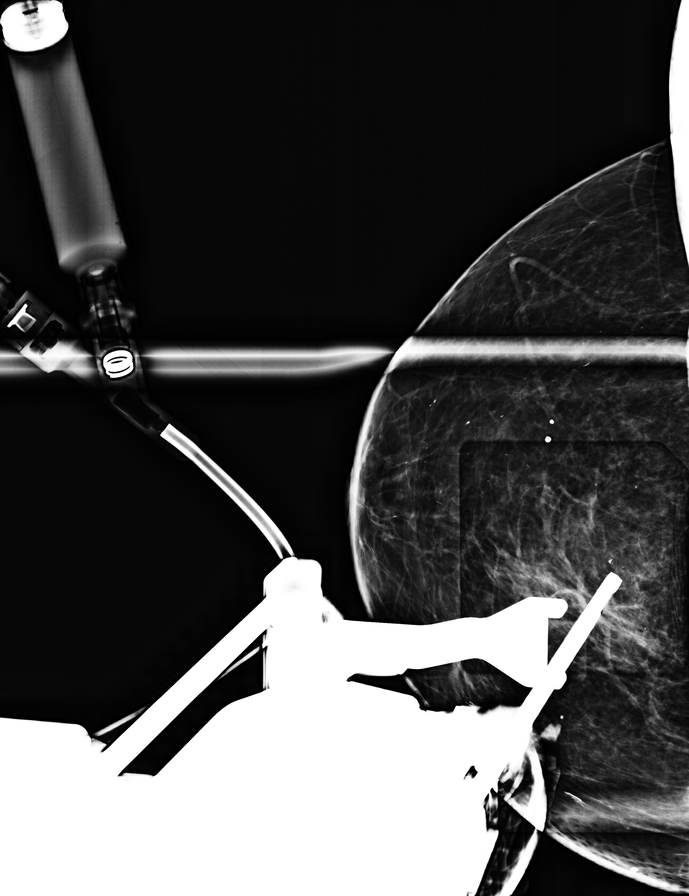

[R CC tomo · tomo slice 33/65.0]
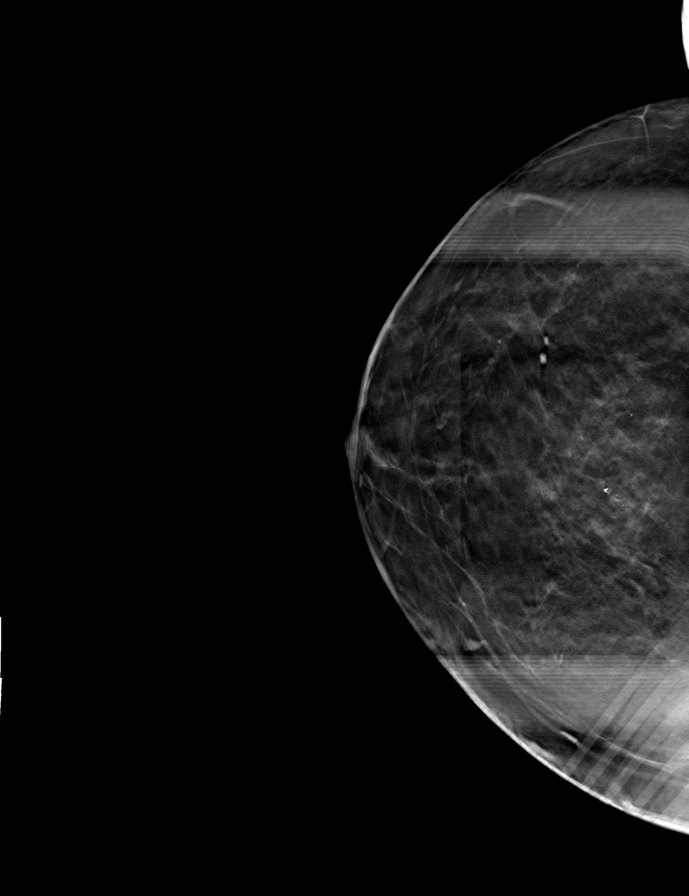

[8 of 22 positions shown; findings below may reference images not displayed]



Using sterile technique and 1% lidocaine and 1% lidocaine with
epinephrine as local anesthetic, under stereotactic guidance, a 9
gauge vacuum assisted device was used to perform core needle biopsy
of calcifications in the UPPER INNER QUADRANT of the RIGHT breast
using a craniocaudal approach. Specimen radiograph was performed
showing calcifications in numerous tissue samples. Specimens with
calcifications are identified for pathology.

Lesion quadrant: UPPER INNER QUADRANT RIGHT breast

At the conclusion of the procedure, coil shaped tissue marker clip
was deployed into the biopsy cavity. Follow-up 2-view mammogram was
performed and dictated separately.
IMPRESSION: Stereotactic-guided biopsy of RIGHT breast calcifications. No
apparent complications.

ADDENDUM:
Pathology revealed FIBROADENOMATOID NODULE WITH CALCIFICATIONS,
MICROGLANDULAR ADENOSIS of the RIGHT breast, upper inner quadrant,
coil clip. This was found to be concordant by Dr. ROBINHO.

Pathology results were discussed with the patient by telephone. The
patient reported doing well after the biopsy with tenderness at the
site. Post biopsy instructions and care were reviewed and questions
were answered. The patient was encouraged to call The [REDACTED]

The patient was instructed to return for annual screening
mammography due [DATE] and informed a reminder notice would be
sent regarding this appointment.

Pathology results reported by ROBINHO RN on [DATE].



Using sterile technique and 1% lidocaine and 1% lidocaine with
epinephrine as local anesthetic, under stereotactic guidance, a 9
gauge vacuum assisted device was used to perform core needle biopsy
of calcifications in the UPPER INNER QUADRANT of the RIGHT breast
using a craniocaudal approach. Specimen radiograph was performed
showing calcifications in numerous tissue samples. Specimens with
calcifications are identified for pathology.

Lesion quadrant: UPPER INNER QUADRANT RIGHT breast

At the conclusion of the procedure, coil shaped tissue marker clip
was deployed into the biopsy cavity. Follow-up 2-view mammogram was
performed and dictated separately.
IMPRESSION: Stereotactic-guided biopsy of RIGHT breast calcifications. No
apparent complications.

## 2020-07-22 IMAGING — MG MM BREAST LOCALIZATION CLIP
4 series · 4 of 12 positions shown · non-contrast
Comparison: Previous exam(s).

CLINICAL DATA: Status post stereotactic guided core biopsy of
calcifications in the RIGHT breast.

EXAM:
DIAGNOSTIC RIGHT MAMMOGRAM POST STEREOTACTIC BIOPSY

[R ML synth-2D]
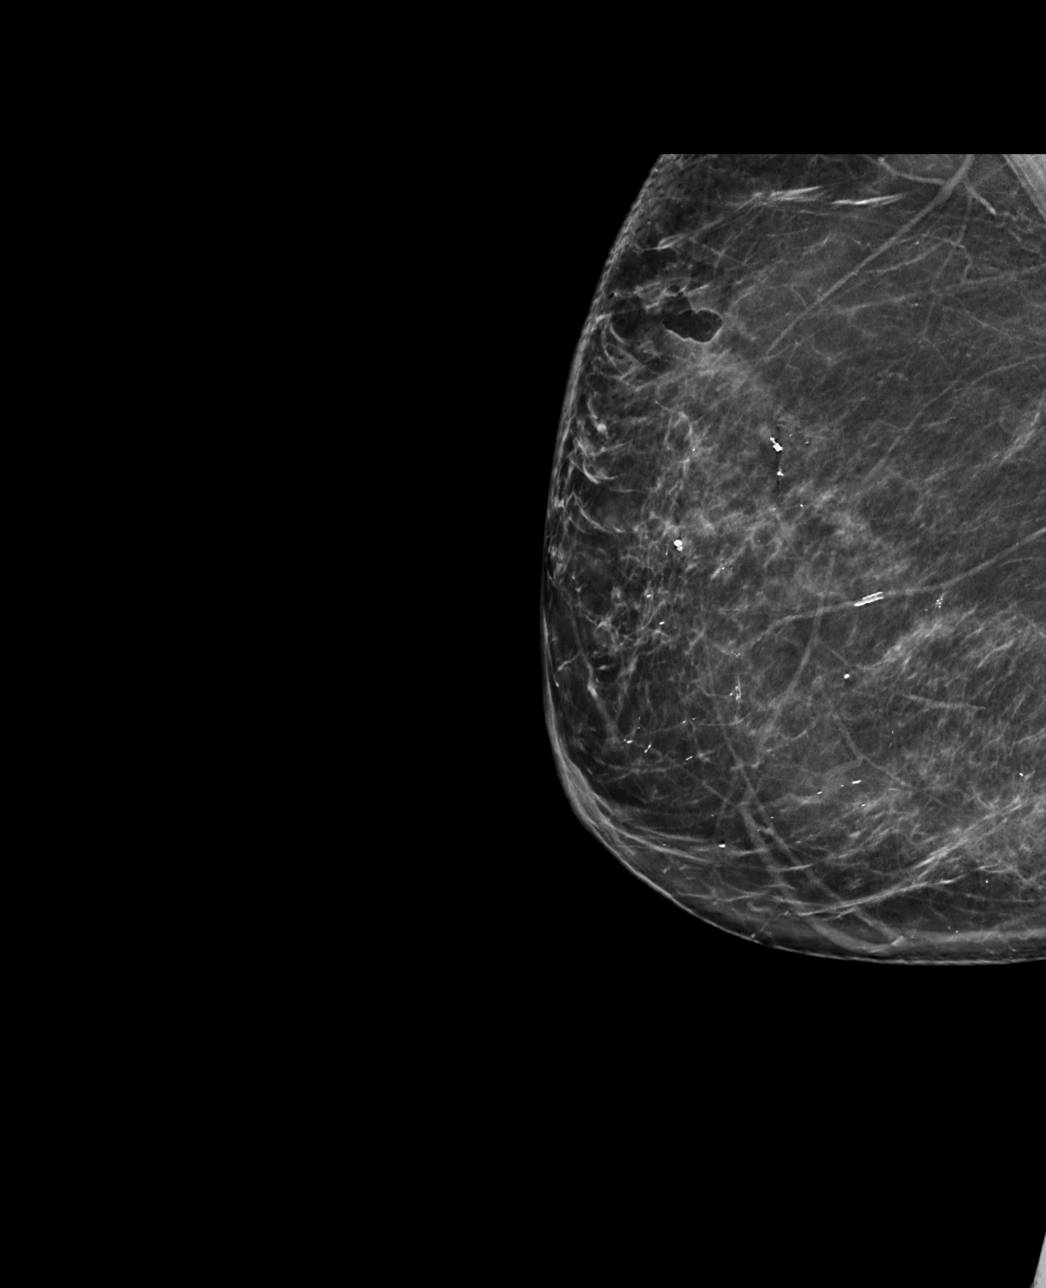

[R CC synth-2D]
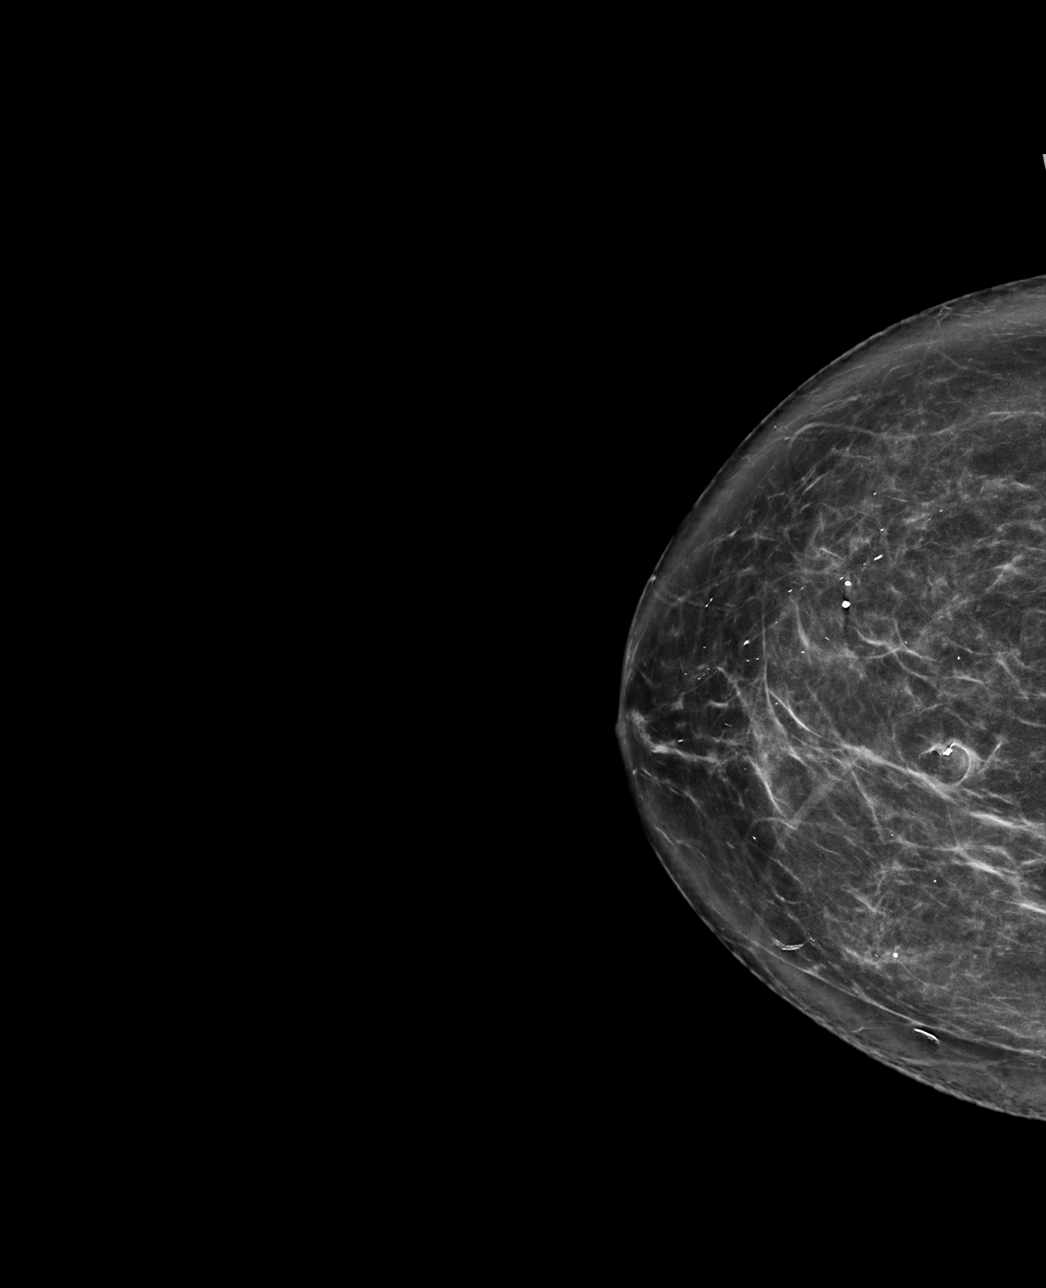

[R CC tomo · tomo slice 37/74.0]
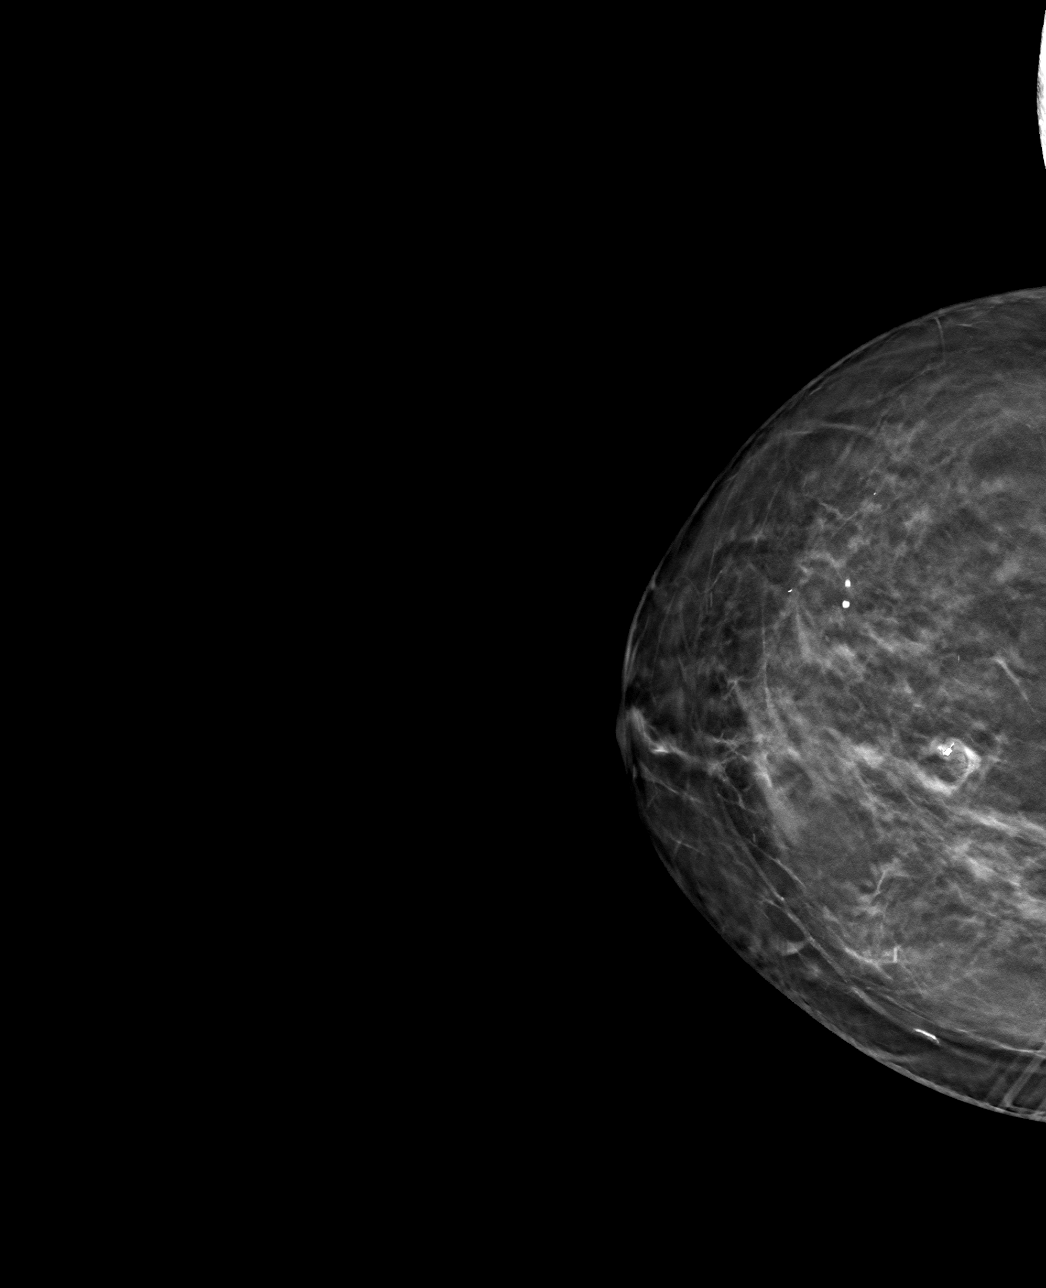

[R ML tomo · tomo slice 38/75.0]
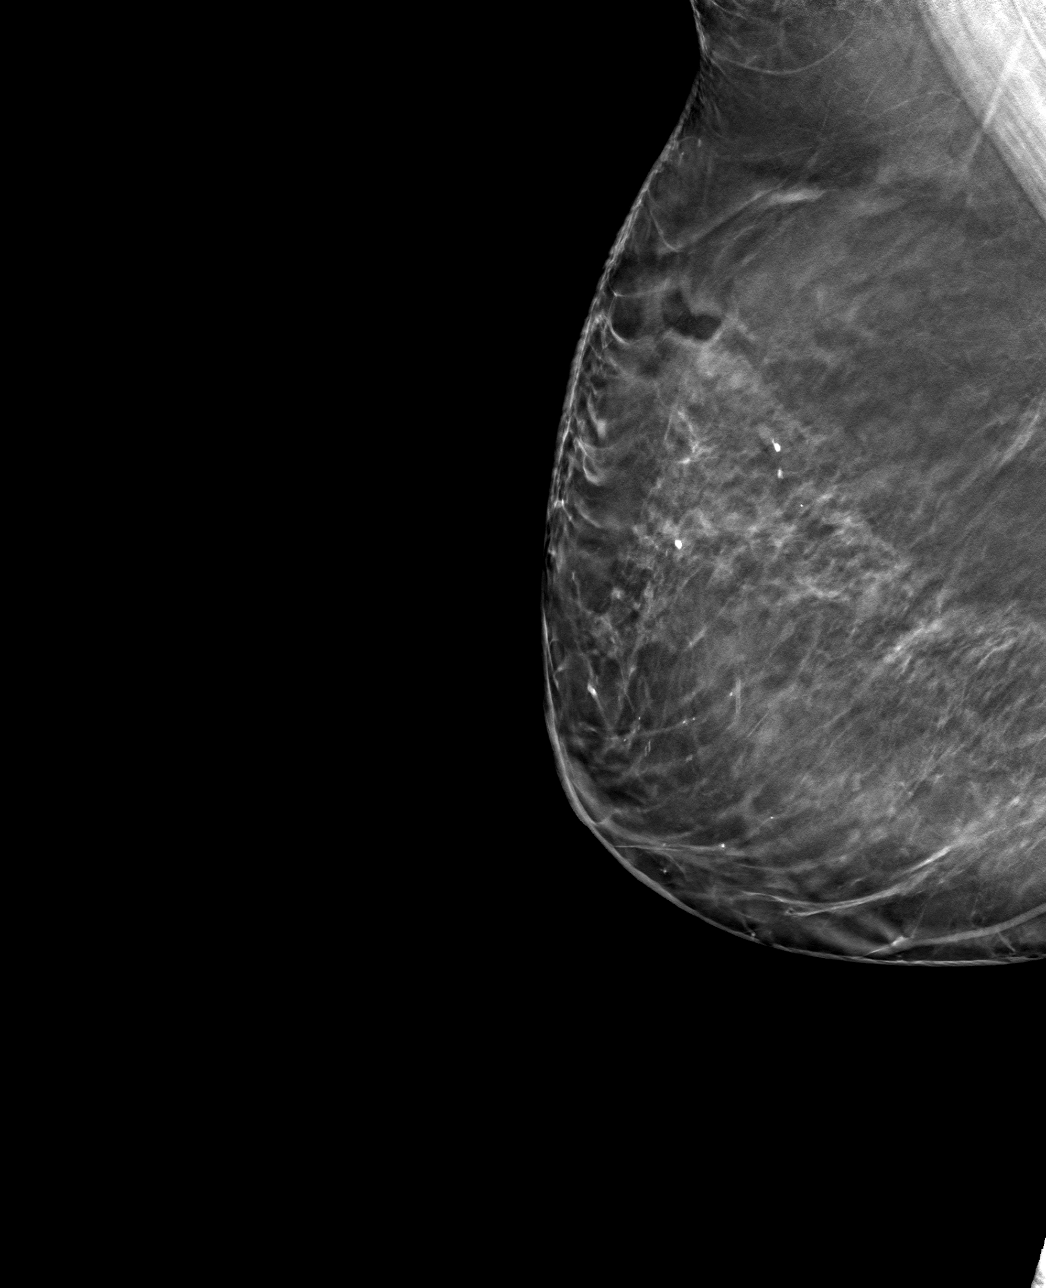

[4 of 12 positions shown; findings below may reference images not displayed]

FINDINGS: Mammographic images were obtained following stereotactic guided
biopsy of calcifications in the UPPER-OUTER QUADRANT of the RIGHT
breast and placement of a coil shaped clip. The biopsy marking clip
is in expected position at the site of biopsy.
IMPRESSION: Appropriate positioning of the coil shaped biopsy marking clip at
the site of biopsy in the UPPER INNER QUADRANT RIGHT breast.

Final Assessment: Post Procedure Mammograms for Marker Placement

## 2020-08-11 ENCOUNTER — Encounter: Payer: Self-pay | Admitting: Podiatry

## 2020-08-11 ENCOUNTER — Ambulatory Visit (INDEPENDENT_AMBULATORY_CARE_PROVIDER_SITE_OTHER): Payer: Medicare Other | Admitting: Podiatry

## 2020-08-11 ENCOUNTER — Other Ambulatory Visit: Payer: Self-pay

## 2020-08-11 DIAGNOSIS — E119 Type 2 diabetes mellitus without complications: Secondary | ICD-10-CM

## 2020-08-11 DIAGNOSIS — L84 Corns and callosities: Secondary | ICD-10-CM

## 2020-08-11 DIAGNOSIS — M79676 Pain in unspecified toe(s): Secondary | ICD-10-CM | POA: Diagnosis not present

## 2020-08-11 DIAGNOSIS — B351 Tinea unguium: Secondary | ICD-10-CM | POA: Diagnosis not present

## 2020-08-11 DIAGNOSIS — M2042 Other hammer toe(s) (acquired), left foot: Secondary | ICD-10-CM

## 2020-08-11 DIAGNOSIS — Z794 Long term (current) use of insulin: Secondary | ICD-10-CM

## 2020-08-11 DIAGNOSIS — M2041 Other hammer toe(s) (acquired), right foot: Secondary | ICD-10-CM

## 2020-08-14 NOTE — Progress Notes (Signed)
Subjective: Kathleen Wiley is a pleasant 74 y.o. female patient seen today painful corn(s) right 2nd digit and left 3rd digit and painful thick toenails that are difficult to trim. Painful toenails interfere with ambulation. Aggravating factors include wearing enclosed shoe gear. Pain is relieved with periodic professional debridement. Painful corns are aggravated when weightbearing when wearing enclosed shoe gear. Pain is relieved with periodic professional debridement.    She states her blood sugar was 150 mg/dl this morning. States she started insulin one month ago.  PCP is Jonathon Jordan, MD. Last visit was 05/28/2020.   Past Medical History:  Diagnosis Date  . Anxiety   . Arthritis    knee, hip  . Cellulitis 08/2016   left leg  . Complication of anesthesia   . Dysrhythmia    Afib due to Flagyl- 7 -2007- not problems since  . GERD (gastroesophageal reflux disease)   . Heart murmur    "slight "  informed years ago.-   . Hernia, umbilical   . History of atrial fibrillation 2007   with flagyl  . History of endometriosis   . Hypercholesteremia   . Hypertension    has not been to a cardiologist  . PONV (postoperative nausea and vomiting)   . Pre-diabetes   . Rectal vaginal fistula   . Refusal of blood transfusions as patient is Jehovah's Witness     Patient Active Problem List   Diagnosis Date Noted  . Pneumonia due to COVID-19 virus 10/25/2019  . Hypokalemia 10/25/2019  . Hyperbilirubinemia 10/25/2019  . Pre-diabetes   . History of total hip arthroplasty, right 09/18/2018  . Osteoarthritis of right hip 09/14/2018  . Morbid obesity, BMI not known ([place]) 09/17/2016  . Stasis ulcer ([place]) 09/17/2016  . 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 Cellulitis of left lower leg 08/10/2016  . Infection of flexor tendon sheath 06/14/2016  . Pain of right thumb 06/14/2016  . GERD (gastroesophageal reflux disease) 02/09/2013  .  Essential hypertension, benign 02/09/2013  . Hypercholesterolemia 02/09/2013  . Unspecified constipation 02/09/2013  . Muscle spasm 02/09/2013  . Osteoarthritis of right knee 01/26/2013  . Osteoarthritis of left hip 05/10/2012    Current Outpatient Medications on File Prior to Visit  Medication Sig Dispense Refill  . ascorbic acid (VITAMIN C) 500 MG tablet Take 1 tablet (500 mg total) by mouth daily. 5 tablet 0  . atenolol-chlorthalidone (TENORETIC) 50-25 MG tablet Take 1 tablet by mouth daily.    . Cholecalciferol (VITAMIN D) 50 MCG (2000 UT) tablet Take 2,000 Units by mouth daily.     . cyanocobalamin 1000 MCG tablet Take 1,000 mcg by mouth daily.     Marland Kitchen dexamethasone (DECADRON) 6 MG tablet Take 1 tablet (6 mg total) by mouth daily. 5 tablet 0  . diphenhydrAMINE (BENADRYL) 25 MG tablet Take 25 mg by mouth at bedtime.    Marland Kitchen escitalopram (LEXAPRO) 10 MG tablet Take 10 mg by mouth daily.    . fluticasone (FLONASE) 50 MCG/ACT nasal spray Place 2 sprays into both nostrils daily as needed for allergies.     . furosemide (LASIX) 20 MG tablet Take 20 mg by mouth 3 (three) times a week.     . gabapentin (NEURONTIN) 300 MG capsule Take 300 mg by mouth at bedtime.     Marland Kitchen gemfibrozil (LOPID) 600 MG tablet Take 600 mg by mouth 2 (two) times daily before a meal.    . guaiFENesin-dextromethorphan (ROBITUSSIN DM)  100-10 MG/5ML syrup Take 10 mLs by mouth every 4 (four) hours as needed for cough. 118 mL 0  . Insulin Pen Needle (NOVOFINE) 32G X 6 MM MISC Inject into the skin daily.    Marland Kitchen omeprazole (PRILOSEC) 20 MG capsule Take 20 mg by mouth daily.    Marland Kitchen OZEMPIC, 0.25 OR 0.5 MG/DOSE, 2 MG/1.5ML SOPN Inject 0.5 mg into the skin once a week.     . Potassium 99 MG TABS Take 99 mg by mouth daily.    Tyler Aas FLEXTOUCH 100 UNIT/ML FlexTouch Pen Inject into the skin.    Marland Kitchen zinc sulfate 220 (50 Zn) MG capsule Take 1 capsule (220 mg total) by mouth daily. 5 capsule 0   No current facility-administered medications on  file prior to visit.    Allergies  Allergen Reactions  . Other     Refuses whole blood but does accept albumin  . Metronidazole Other (See Comments)    Chest pain, A-fib   . Septra [Sulfamethoxazole-Trimethoprim] Other (See Comments)    Blisters     Objective: Physical Exam  General: BELEN PESCH is a pleasant 74 y.o. Caucasian female, in NAD. AAO x 3.   Vascular:  Neurovascular status unchanged b/l lower extremities. Capillary refill time to digits immediate b/l. Palpable pedal pulses b/l LE. Pedal hair sparse. Lower extremity skin temperature gradient within normal limits. Nonpitting edema noted b/l lower extremities.  Dermatological:  Pedal skin with normal turgor, texture and tone bilaterally. No open wounds bilaterally. No interdigital macerations bilaterally. Toenails 1-5 b/l elongated, discolored, dystrophic, thickened, crumbly with subungual debris and tenderness to dorsal palpation. Incurvated nailplate medial border(s) L hallux.  Nail border hypertrophy not present. There is tenderness to palpation. Sign(s) of infection: no clinical signs of infection noted on examination today. Hyperkeratotic lesion(s) L 2nd toe and L 3rd toe. Left 3rd digit corn is preulerative with subdermal hemorrhage. No erythema, no edema, no drainage, no flocculence.  Musculoskeletal:  Normal muscle strength 5/5 to all lower extremity muscle groups bilaterally. No pain crepitus or joint limitation noted with ROM b/l. Hallux valgus with bunion deformity noted b/l lower extremities. Hammertoes noted to the 2-5 bilaterally.  Neurological:  Protective sensation intact 5/5 intact bilaterally with 10g monofilament b/l. Vibratory sensation intact b/l. Proprioception intact bilaterally. Clonus negative b/l.  Assessment and Plan:  1. Pain due to onychomycosis of toenail   2. Pre-ulcerative corn or callous   3. Acquired hammertoes of both feet   4. Type 2 diabetes mellitus without complication, with  long-term current use of insulin ([place])    -Examined patient. -Continue diabetic foot care principles. -Toenails 1-5 b/l were debrided in length and girth with sterile nail nippers and dremel without iatrogenic bleeding.  -Offending nail border debrided and curretaged L hallux utilizing sterile nail nipper and currette. Border cleansed with alcohol and triple antibiotic applied. No further treatment required by patient/caregiver. -Preulcerative corn(s)  L 3rd toe and corn L 2nd toe pared utilizing sterile scalpel blade without complication or incident. Total number debrided=2. -Discussed flexor tenotomy to prevent ulceration of left 3rd toe. She is interested and will f/u and discuss with Dr. Sherryle Lis -Patient to report any pedal injuries to medical professional immediately. -Patient to continue soft, supportive shoe gear daily. -Patient/POA to call should there be question/concern in the interim.  Return in about 3 months (around 11/11/2020) for diabetic toenails, corn(s)/callus(es).  Marzetta Board, DPM

## 2020-08-18 ENCOUNTER — Other Ambulatory Visit: Payer: Self-pay

## 2020-08-18 ENCOUNTER — Ambulatory Visit: Payer: Medicare Other | Admitting: Podiatry

## 2020-08-18 DIAGNOSIS — M2012 Hallux valgus (acquired), left foot: Secondary | ICD-10-CM

## 2020-08-18 DIAGNOSIS — M21612 Bunion of left foot: Secondary | ICD-10-CM

## 2020-08-18 DIAGNOSIS — M2042 Other hammer toe(s) (acquired), left foot: Secondary | ICD-10-CM

## 2020-08-18 DIAGNOSIS — L84 Corns and callosities: Secondary | ICD-10-CM

## 2020-08-18 DIAGNOSIS — R7303 Prediabetes: Secondary | ICD-10-CM

## 2020-08-18 DIAGNOSIS — M21611 Bunion of right foot: Secondary | ICD-10-CM

## 2020-08-18 DIAGNOSIS — M2041 Other hammer toe(s) (acquired), right foot: Secondary | ICD-10-CM

## 2020-08-18 DIAGNOSIS — M2011 Hallux valgus (acquired), right foot: Secondary | ICD-10-CM

## 2020-08-19 ENCOUNTER — Encounter: Payer: Self-pay | Admitting: Podiatry

## 2020-08-19 NOTE — Progress Notes (Signed)
  Subjective:  Patient ID: Kathleen Wiley, female    DOB: 1946-03-25,  MRN: [id number]  Chief Complaint  Patient presents with  . Foot Pain    PT stated that another DR thought it would be good for her to have a visit about her 2nd.3rd toes    74 y.o. female presents with the above complaint. History confirmed with patient.  She is referred to me by Dr. Elisha Ponder for preulcerative callus on the left third toe  Objective:  Physical Exam: warm, good capillary refill, no trophic changes or ulcerative lesions, normal DP and PT pulses and loss of protective sensation bilaterally plantar foot.  On the left foot she has semireducible hammertoe contractures and moderate to severe hallux valgus bilaterally..  Previous hammertoe surgery in the right foot.  The left third toe has a preulcerative callus at the tip  Assessment:   1. Hammertoe of left foot   2. Hammertoe of right foot   3. Hallux valgus with bunions, left   4. Hallux valgus with bunions, right   5. Pre-ulcerative calluses   6. Pre-diabetes      Plan:  Patient was evaluated and treated and all questions answered.   Hammertoe 3rd toe -Discussed proceeding with flexor tenotomy procedure. Patient agrees to proceed. -Consent form reviewed and signed. -Proceed with flexor tenotomy as below  Procedure: Flexor Tenotomy Indication for Procedure: toe with semi-reducible hammertoe with distal tip ulceration. Flexor tenotomy indicated to alleviate contracture, reduce pressure, and enhance healing of the ulceration. Location:  Anesthesia: Lidocaine 1% plain; 1.5 mL and Marcaine 0.5% plain; 1.5 mL digital block Instrumentation: 18 g needle  Technique: The toe was anesthetized as above and prepped in the usual fashion. The toe was exsanquinated and a tourniquet was secured at the base of the toe. An 18g needle was then used to percutaneously release the flexor tendon at the plantar surface of the toe with noted release of the hammertoe  deformity. The incision was then dressed with gauze and coban. Compression splint dressing applied. Patient tolerated the procedure well. Dressing: Dry, sterile, compression dressing. Disposition: Patient tolerated procedure well. Patient to return in 4 weeks for follow-up.      Return in about 1 month (around 09/18/2020) for re-check toe procedure.

## 2020-11-12 ENCOUNTER — Ambulatory Visit: Payer: Medicare Other | Admitting: Podiatry

## 2020-11-19 ENCOUNTER — Ambulatory Visit: Payer: Medicare Other | Admitting: Podiatry

## 2020-11-19 ENCOUNTER — Other Ambulatory Visit: Payer: Self-pay

## 2020-11-19 DIAGNOSIS — B351 Tinea unguium: Secondary | ICD-10-CM

## 2020-11-19 DIAGNOSIS — E119 Type 2 diabetes mellitus without complications: Secondary | ICD-10-CM

## 2020-11-19 DIAGNOSIS — L989 Disorder of the skin and subcutaneous tissue, unspecified: Secondary | ICD-10-CM

## 2020-11-19 DIAGNOSIS — Z794 Long term (current) use of insulin: Secondary | ICD-10-CM

## 2020-11-19 DIAGNOSIS — M79676 Pain in unspecified toe(s): Secondary | ICD-10-CM | POA: Diagnosis not present

## 2020-11-19 NOTE — Progress Notes (Signed)
   SUBJECTIVE Patient with a history of diabetes mellitus presents to office today complaining of elongated, thickened nails that cause pain while ambulating in shoes.  She is unable to trim her own nails. Patient is here for further evaluation and treatment.   Past Medical History:  Diagnosis Date  . Anxiety   . Arthritis    knee, hip  . Cellulitis 08/2016   left leg  . Complication of anesthesia   . Dysrhythmia    Afib due to Flagyl- 7 -2007- not problems since  . GERD (gastroesophageal reflux disease)   . Heart murmur    "slight "  informed years ago.-   . Hernia, umbilical   . History of atrial fibrillation 2007   with flagyl  . History of endometriosis   . Hypercholesteremia   . Hypertension    has not been to a cardiologist  . PONV (postoperative nausea and vomiting)   . Pre-diabetes   . Rectal vaginal fistula   . Refusal of blood transfusions as patient is [place] Patient is awake, alert, and oriented x 3 and in no acute distress. Derm Skin is dry and supple bilateral. Negative open lesions or macerations. Remaining integument unremarkable. Nails are tender, long, thickened and dystrophic with subungual debris, consistent with onychomycosis, 1-5 bilateral. No signs of infection noted.  Hyperkeratotic preulcerative callus tissue also noted to the bilateral feet Vasc  DP and PT pedal pulses palpable bilaterally. Temperature gradient within normal limits.  Neuro Epicritic and protective threshold sensation diminished bilaterally.  Musculoskeletal Exam No symptomatic pedal deformities noted bilateral. Muscular strength within normal limits.  ASSESSMENT 1. Diabetes Mellitus w/ peripheral neuropathy 2. Onychomycosis of nail due to dermatophyte bilateral 3. Pain in foot bilateral 4.  Preulcerative callus lesions bilateral feet  PLAN OF CARE 1. Patient evaluated today. 2. Instructed to maintain good pedal hygiene and foot care. Stressed  importance of controlling blood sugar.  3. Mechanical debridement of nails 1-5 bilaterally performed using a nail nipper. Filed with dremel without incident.  4.  Excisional debridement of the hyperkeratotic preulcerative callus tissue was performed using a tissue nipper without incident or bleeding  5.  Return to clinic in 3 mos.     Edrick Kins, DPM [facility]  Dr. Edrick Kins, [address]                                        [place], [place] [address]                Office [phone]  Fax [phone]

## 2020-11-27 ENCOUNTER — Ambulatory Visit: Payer: Medicare Other | Admitting: Podiatry

## 2020-12-30 DIAGNOSIS — Z794 Long term (current) use of insulin: Secondary | ICD-10-CM | POA: Diagnosis not present

## 2020-12-30 DIAGNOSIS — K909 Intestinal malabsorption, unspecified: Secondary | ICD-10-CM | POA: Diagnosis not present

## 2020-12-30 DIAGNOSIS — L989 Disorder of the skin and subcutaneous tissue, unspecified: Secondary | ICD-10-CM | POA: Diagnosis not present

## 2020-12-30 DIAGNOSIS — E1165 Type 2 diabetes mellitus with hyperglycemia: Secondary | ICD-10-CM | POA: Diagnosis not present

## 2020-12-31 DIAGNOSIS — I8310 Varicose veins of unspecified lower extremity with inflammation: Secondary | ICD-10-CM | POA: Diagnosis not present

## 2021-01-13 DIAGNOSIS — L821 Other seborrheic keratosis: Secondary | ICD-10-CM | POA: Diagnosis not present

## 2021-01-13 DIAGNOSIS — Q828 Other specified congenital malformations of skin: Secondary | ICD-10-CM | POA: Diagnosis not present

## 2021-01-13 DIAGNOSIS — I8310 Varicose veins of unspecified lower extremity with inflammation: Secondary | ICD-10-CM | POA: Diagnosis not present

## 2021-02-18 ENCOUNTER — Other Ambulatory Visit: Payer: Self-pay

## 2021-02-18 ENCOUNTER — Ambulatory Visit: Payer: Medicare Other | Admitting: Podiatry

## 2021-02-18 DIAGNOSIS — G4733 Obstructive sleep apnea (adult) (pediatric): Secondary | ICD-10-CM | POA: Insufficient documentation

## 2021-02-18 DIAGNOSIS — F418 Other specified anxiety disorders: Secondary | ICD-10-CM | POA: Insufficient documentation

## 2021-02-18 DIAGNOSIS — D72819 Decreased white blood cell count, unspecified: Secondary | ICD-10-CM | POA: Insufficient documentation

## 2021-02-18 DIAGNOSIS — R0683 Snoring: Secondary | ICD-10-CM | POA: Insufficient documentation

## 2021-02-18 DIAGNOSIS — E559 Vitamin D deficiency, unspecified: Secondary | ICD-10-CM | POA: Insufficient documentation

## 2021-02-18 DIAGNOSIS — D692 Other nonthrombocytopenic purpura: Secondary | ICD-10-CM | POA: Insufficient documentation

## 2021-02-18 DIAGNOSIS — I77810 Thoracic aortic ectasia: Secondary | ICD-10-CM | POA: Insufficient documentation

## 2021-02-18 DIAGNOSIS — L84 Corns and callosities: Secondary | ICD-10-CM | POA: Insufficient documentation

## 2021-02-18 DIAGNOSIS — R87619 Unspecified abnormal cytological findings in specimens from cervix uteri: Secondary | ICD-10-CM | POA: Insufficient documentation

## 2021-02-18 DIAGNOSIS — F419 Anxiety disorder, unspecified: Secondary | ICD-10-CM | POA: Insufficient documentation

## 2021-02-18 DIAGNOSIS — B351 Tinea unguium: Secondary | ICD-10-CM

## 2021-02-18 DIAGNOSIS — I517 Cardiomegaly: Secondary | ICD-10-CM | POA: Insufficient documentation

## 2021-02-18 DIAGNOSIS — J9601 Acute respiratory failure with hypoxia: Secondary | ICD-10-CM | POA: Insufficient documentation

## 2021-02-18 DIAGNOSIS — E8881 Metabolic syndrome: Secondary | ICD-10-CM | POA: Insufficient documentation

## 2021-02-18 DIAGNOSIS — M545 Low back pain, unspecified: Secondary | ICD-10-CM | POA: Insufficient documentation

## 2021-02-18 DIAGNOSIS — N182 Chronic kidney disease, stage 2 (mild): Secondary | ICD-10-CM | POA: Insufficient documentation

## 2021-02-18 DIAGNOSIS — R600 Localized edema: Secondary | ICD-10-CM | POA: Insufficient documentation

## 2021-02-18 DIAGNOSIS — G629 Polyneuropathy, unspecified: Secondary | ICD-10-CM | POA: Insufficient documentation

## 2021-02-18 DIAGNOSIS — E1121 Type 2 diabetes mellitus with diabetic nephropathy: Secondary | ICD-10-CM | POA: Insufficient documentation

## 2021-02-18 DIAGNOSIS — D700 Congenital agranulocytosis: Secondary | ICD-10-CM | POA: Insufficient documentation

## 2021-02-18 DIAGNOSIS — M2042 Other hammer toe(s) (acquired), left foot: Secondary | ICD-10-CM | POA: Diagnosis not present

## 2021-02-18 DIAGNOSIS — M129 Arthropathy, unspecified: Secondary | ICD-10-CM | POA: Insufficient documentation

## 2021-02-18 DIAGNOSIS — K909 Intestinal malabsorption, unspecified: Secondary | ICD-10-CM | POA: Insufficient documentation

## 2021-02-18 DIAGNOSIS — R7401 Elevation of levels of liver transaminase levels: Secondary | ICD-10-CM | POA: Insufficient documentation

## 2021-02-18 DIAGNOSIS — E538 Deficiency of other specified B group vitamins: Secondary | ICD-10-CM | POA: Insufficient documentation

## 2021-02-18 DIAGNOSIS — I712 Thoracic aortic aneurysm, without rupture, unspecified: Secondary | ICD-10-CM | POA: Insufficient documentation

## 2021-02-18 DIAGNOSIS — M79675 Pain in left toe(s): Secondary | ICD-10-CM | POA: Diagnosis not present

## 2021-02-18 DIAGNOSIS — M79674 Pain in right toe(s): Secondary | ICD-10-CM

## 2021-02-18 DIAGNOSIS — R945 Abnormal results of liver function studies: Secondary | ICD-10-CM | POA: Insufficient documentation

## 2021-02-18 DIAGNOSIS — N3941 Urge incontinence: Secondary | ICD-10-CM | POA: Insufficient documentation

## 2021-02-18 DIAGNOSIS — B977 Papillomavirus as the cause of diseases classified elsewhere: Secondary | ICD-10-CM | POA: Insufficient documentation

## 2021-02-18 DIAGNOSIS — E1165 Type 2 diabetes mellitus with hyperglycemia: Secondary | ICD-10-CM | POA: Insufficient documentation

## 2021-02-18 DIAGNOSIS — R609 Edema, unspecified: Secondary | ICD-10-CM | POA: Insufficient documentation

## 2021-02-18 DIAGNOSIS — M2010 Hallux valgus (acquired), unspecified foot: Secondary | ICD-10-CM | POA: Insufficient documentation

## 2021-02-18 DIAGNOSIS — R7989 Other specified abnormal findings of blood chemistry: Secondary | ICD-10-CM | POA: Insufficient documentation

## 2021-02-18 DIAGNOSIS — N870 Mild cervical dysplasia: Secondary | ICD-10-CM | POA: Insufficient documentation

## 2021-02-18 DIAGNOSIS — R0789 Other chest pain: Secondary | ICD-10-CM | POA: Insufficient documentation

## 2021-02-18 DIAGNOSIS — U071 COVID-19: Secondary | ICD-10-CM | POA: Insufficient documentation

## 2021-02-18 DIAGNOSIS — I359 Nonrheumatic aortic valve disorder, unspecified: Secondary | ICD-10-CM | POA: Insufficient documentation

## 2021-02-18 NOTE — Progress Notes (Signed)
   SUBJECTIVE Patient presents to office today complaining of elongated, thickened nails that cause pain while ambulating in shoes.  She is unable to trim her own nails. Patient is here for further evaluation and treatment.  Past Medical History:  Diagnosis Date  . Anxiety   . Arthritis    knee, hip  . Cellulitis 08/2016   left leg  . Complication of anesthesia   . Dysrhythmia    Afib due to Flagyl- 7 -2007- not problems since  . GERD (gastroesophageal reflux disease)   . Heart murmur    "slight "  informed years ago.-   . Hernia, umbilical   . History of atrial fibrillation 2007   with flagyl  . History of endometriosis   . Hypercholesteremia   . Hypertension    has not been to a cardiologist  . PONV (postoperative nausea and vomiting)   . Pre-diabetes   . Rectal vaginal fistula   . Refusal of blood transfusions as patient is [place] Patient is awake, alert, and oriented x 3 and in no acute distress. Derm Skin is dry and supple bilateral. Negative open lesions or macerations. Remaining integument unremarkable. Nails are tender, long, thickened and dystrophic with subungual debris, consistent with onychomycosis, 1-5 bilateral. No signs of infection noted. Vasc  DP and PT pedal pulses palpable bilaterally. Temperature gradient within normal limits.  Neuro Epicritic and protective threshold sensation grossly intact bilaterally.  Musculoskeletal Exam No symptomatic pedal deformities noted bilateral. Muscular strength within normal limits.  Semireducible hammertoe deformity noted to the left second toe  ASSESSMENT 1. Onychodystrophic nails 1-5 bilateral with hyperkeratosis of nails.  2. Onychomycosis of nail due to dermatophyte bilateral 3. Pain in foot bilateral 4.  Semireducible hammertoe deformity second digit left foot  PLAN OF CARE 1. Patient evaluated today.  2. Instructed to maintain good pedal hygiene and foot care.  3. Mechanical  debridement of nails 1-5 bilaterally performed using a nail nipper. Filed with dremel without incident.  4.  The patient has had flexor tenotomies performed to the third digit of the left foot which alleviated her distal tuft callus.  She would like to have it performed to the second toe however this Friday she has an event coming up so she would like to have it scheduled after her event  5.  Return to clinic in 1 week for flexor tenotomy of the left second toe.    Edrick Kins, DPM [facility]  Dr. Edrick Kins, [address]                                        [place], [place] [address]                Office [phone]  Fax [phone]

## 2021-03-04 ENCOUNTER — Other Ambulatory Visit: Payer: Self-pay

## 2021-03-04 ENCOUNTER — Ambulatory Visit: Payer: Medicare Other | Admitting: Podiatry

## 2021-03-04 DIAGNOSIS — M2042 Other hammer toe(s) (acquired), left foot: Secondary | ICD-10-CM

## 2021-03-04 NOTE — Progress Notes (Signed)
   HPI: 75 y.o. female presenting today to have her hammertoe to the left second toe corrected by doing a flexor tenotomy.  She does have a history of flexor tenotomy's in the past to different digits which have significantly improved her symptoms.  She would like to have it performed to the second toe today.  She presents for further treatment and evaluation  Past Medical History:  Diagnosis Date  . Anxiety   . Arthritis    knee, hip  . Cellulitis 08/2016   left leg  . Complication of anesthesia   . Dysrhythmia    Afib due to Flagyl- 7 -2007- not problems since  . GERD (gastroesophageal reflux disease)   . Heart murmur    "slight "  informed years ago.-   . Hernia, umbilical   . History of atrial fibrillation 2007   with flagyl  . History of endometriosis   . Hypercholesteremia   . Hypertension    has not been to a cardiologist  . PONV (postoperative nausea and vomiting)   . Pre-diabetes   . Rectal vaginal fistula   . Refusal of blood transfusions as patient is [employer]      Objective: Physical Exam General: The patient is alert and oriented x3 in no acute distress.  Dermatology: Skin is cool, dry and supple bilateral lower extremities. Negative for open lesions or macerations.  Vascular: Palpable pedal pulses bilaterally. No edema or erythema noted. Capillary refill within normal limits.  Neurological: Epicritic and protective threshold grossly intact bilaterally.   Musculoskeletal Exam: All pedal and ankle joints range of motion within normal limits bilateral. Muscle strength 5/5 in all groups bilateral.  Reducible hammertoe contracture deformity noted to the symptomatic toe  Assessment: 1.  Reducible hammertoe left second toe   Plan of Care:  1. Patient evaluated.  2.  Different treatment options were discussed with the patient. 3.  After evaluating the patient I do believe that a flexor tenotomy to the respective digit would help alleviate the patient's  symptoms.  This would lift the toe to alleviate pressure from the digit.  The procedure was explained in detail and all patient questions were answered.  No guarantees were expressed or implied.  The patient consented for correction here in the office 4.  Prior to procedure the toe was blocked in a digital block fashion using 3 mL of lidocaine 2%. 5.  Flexor tenotomy was performed of the respective digit using a surgical #11 scalpel and a small percutaneous stab incision on the plantar sulcus of the toe.  The toe was immediately in a more rectus position.  Betadine soaked dry sterile dressing was applied. 6.  Post care instructions were provided 7.  Surgical shoe dispensed 8.  Return to clinic in 1 week    Edrick Kins, DPM [facility]  Dr. Edrick Kins, DPM    [address], [place] [address]                Office [phone]  Fax [phone]

## 2021-03-09 ENCOUNTER — Ambulatory Visit: Payer: Medicare Other | Admitting: Podiatry

## 2021-03-14 ENCOUNTER — Other Ambulatory Visit: Payer: Self-pay

## 2021-03-14 ENCOUNTER — Inpatient Hospital Stay (HOSPITAL_COMMUNITY)
Admission: EM | Admit: 2021-03-14 | Discharge: 2021-03-17 | DRG: 417 | Disposition: A | Discharge status: Home / Self Care | Payer: Medicare Other | Attending: Internal Medicine | Admitting: Internal Medicine

## 2021-03-14 ENCOUNTER — Encounter (HOSPITAL_COMMUNITY): Payer: Self-pay | Admitting: Emergency Medicine

## 2021-03-14 ENCOUNTER — Emergency Department (HOSPITAL_COMMUNITY): Payer: Medicare Other

## 2021-03-14 DIAGNOSIS — K851 Biliary acute pancreatitis without necrosis or infection: Principal | ICD-10-CM | POA: Diagnosis present

## 2021-03-14 DIAGNOSIS — K8050 Calculus of bile duct without cholangitis or cholecystitis without obstruction: Secondary | ICD-10-CM

## 2021-03-14 DIAGNOSIS — K219 Gastro-esophageal reflux disease without esophagitis: Secondary | ICD-10-CM | POA: Diagnosis present

## 2021-03-14 DIAGNOSIS — G4730 Sleep apnea, unspecified: Secondary | ICD-10-CM | POA: Diagnosis not present

## 2021-03-14 DIAGNOSIS — Z96643 Presence of artificial hip joint, bilateral: Secondary | ICD-10-CM | POA: Diagnosis not present

## 2021-03-14 DIAGNOSIS — K810 Acute cholecystitis: Secondary | ICD-10-CM

## 2021-03-14 DIAGNOSIS — E876 Hypokalemia: Secondary | ICD-10-CM | POA: Diagnosis present

## 2021-03-14 DIAGNOSIS — R110 Nausea: Secondary | ICD-10-CM | POA: Diagnosis not present

## 2021-03-14 DIAGNOSIS — K8062 Calculus of gallbladder and bile duct with acute cholecystitis without obstruction: Principal | ICD-10-CM | POA: Diagnosis present

## 2021-03-14 DIAGNOSIS — R1013 Epigastric pain: Secondary | ICD-10-CM | POA: Diagnosis not present

## 2021-03-14 DIAGNOSIS — E1140 Type 2 diabetes mellitus with diabetic neuropathy, unspecified: Secondary | ICD-10-CM | POA: Diagnosis not present

## 2021-03-14 DIAGNOSIS — K859 Acute pancreatitis without necrosis or infection, unspecified: Secondary | ICD-10-CM

## 2021-03-14 DIAGNOSIS — E7800 Pure hypercholesterolemia, unspecified: Secondary | ICD-10-CM | POA: Diagnosis not present

## 2021-03-14 DIAGNOSIS — Z882 Allergy status to sulfonamides status: Secondary | ICD-10-CM

## 2021-03-14 DIAGNOSIS — Z20822 Contact with and (suspected) exposure to covid-19: Secondary | ICD-10-CM | POA: Diagnosis not present

## 2021-03-14 DIAGNOSIS — Z96651 Presence of right artificial knee joint: Secondary | ICD-10-CM | POA: Diagnosis not present

## 2021-03-14 DIAGNOSIS — Z87891 Personal history of nicotine dependence: Secondary | ICD-10-CM

## 2021-03-14 DIAGNOSIS — Z888 Allergy status to other drugs, medicaments and biological substances status: Secondary | ICD-10-CM

## 2021-03-14 DIAGNOSIS — Z79899 Other long term (current) drug therapy: Secondary | ICD-10-CM | POA: Diagnosis not present

## 2021-03-14 DIAGNOSIS — K7460 Unspecified cirrhosis of liver: Secondary | ICD-10-CM | POA: Diagnosis present

## 2021-03-14 DIAGNOSIS — E669 Obesity, unspecified: Secondary | ICD-10-CM

## 2021-03-14 DIAGNOSIS — K429 Umbilical hernia without obstruction or gangrene: Secondary | ICD-10-CM | POA: Diagnosis not present

## 2021-03-14 DIAGNOSIS — I10 Essential (primary) hypertension: Secondary | ICD-10-CM | POA: Diagnosis present

## 2021-03-14 DIAGNOSIS — K8020 Calculus of gallbladder without cholecystitis without obstruction: Secondary | ICD-10-CM | POA: Diagnosis not present

## 2021-03-14 DIAGNOSIS — R945 Abnormal results of liver function studies: Secondary | ICD-10-CM | POA: Diagnosis not present

## 2021-03-14 DIAGNOSIS — Z794 Long term (current) use of insulin: Secondary | ICD-10-CM | POA: Diagnosis not present

## 2021-03-14 DIAGNOSIS — Z6839 Body mass index (BMI) 39.0-39.9, adult: Secondary | ICD-10-CM

## 2021-03-14 DIAGNOSIS — K760 Fatty (change of) liver, not elsewhere classified: Secondary | ICD-10-CM | POA: Diagnosis not present

## 2021-03-14 HISTORY — DX: Type 2 diabetes mellitus without complications: E11.9

## 2021-03-14 HISTORY — DX: Sleep apnea, unspecified: G47.30

## 2021-03-14 LAB — LIPASE, BLOOD: Lipase: 3782 U/L — ABNORMAL HIGH (ref 11–51)

## 2021-03-14 LAB — CBC
HCT: 39.2 % (ref 36.0–46.0)
Hemoglobin: 12.7 g/dL (ref 12.0–15.0)
MCH: 32.3 pg (ref 26.0–34.0)
MCHC: 32.4 g/dL (ref 30.0–36.0)
MCV: 99.7 fL (ref 80.0–100.0)
Platelets: 201 10*3/uL (ref 150–400)
RBC: 3.93 MIL/uL (ref 3.87–5.11)
RDW: 14.3 % (ref 11.5–15.5)
WBC: 3.9 10*3/uL — ABNORMAL LOW (ref 4.0–10.5)
nRBC: 0 % (ref 0.0–0.2)

## 2021-03-14 LAB — COMPREHENSIVE METABOLIC PANEL
ALT: 112 U/L — ABNORMAL HIGH (ref 0–44)
AST: 115 U/L — ABNORMAL HIGH (ref 15–41)
Albumin: 3.3 g/dL — ABNORMAL LOW (ref 3.5–5.0)
Alkaline Phosphatase: 206 U/L — ABNORMAL HIGH (ref 38–126)
Anion gap: 12 (ref 5–15)
BUN: 12 mg/dL (ref 8–23)
CO2: 26 mmol/L (ref 22–32)
Calcium: 9 mg/dL (ref 8.9–10.3)
Chloride: 98 mmol/L (ref 98–111)
Creatinine, Ser: 0.76 mg/dL (ref 0.44–1.00)
GFR, Estimated: >60 mL/min (ref >60)
Glucose, Bld: 179 mg/dL — ABNORMAL HIGH (ref 70–99)
Potassium: 3.4 mmol/L — ABNORMAL LOW (ref 3.5–5.1)
Sodium: 136 mmol/L (ref 135–145)
Total Bilirubin: 6 mg/dL — ABNORMAL HIGH (ref 0.3–1.2)
Total Protein: 7 g/dL (ref 6.5–8.1)

## 2021-03-14 MED ORDER — ONDANSETRON 4 MG PO TBDP
4.0000 mg | ORAL_TABLET | Freq: Once | ORAL | Status: AC | PRN
  Administered 2021-03-14: 4 mg via ORAL
  Filled 2021-03-14: qty 1

## 2021-03-14 MED ORDER — SODIUM CHLORIDE 0.9 % IV BOLUS
1000.0000 mL | Freq: Once | INTRAVENOUS | Status: AC
  Administered 2021-03-14: 1000 mL via INTRAVENOUS

## 2021-03-14 NOTE — ED Provider Notes (Signed)
[facility] EMERGENCY DEPARTMENT Provider Note  CSN: [id number] Arrival date & time: 03/14/21 2042    History Chief Complaint  Patient presents with  . Abdominal Pain    HPI  Kathleen Wiley is a 75 y.o. female reports about 3-4 days of waxing and waning epigastric and RUQ pain radiating into her back, associated with nausea but no vomiting and worse after eating. She was seen at PCP today where labs were drawn, triage note says LFTs were elevated but patient is not aware of this. She reports pain became very severe this evening but has improved since arrival to the ED. No known history of biliary disease. She has umbilical hernia which has not been painful recently. Stools have been lighter in color. No blood.    Past Medical History:  Diagnosis Date  . Anxiety   . Arthritis    knee, hip  . Cellulitis 08/2016   left leg  . Complication of anesthesia   . Dysrhythmia    Afib due to Flagyl- 7 -2007- not problems since  . GERD (gastroesophageal reflux disease)   . Heart murmur    "slight "  informed years ago.-   . Hernia, umbilical   . History of atrial fibrillation 2007   with flagyl  . History of endometriosis   . Hypercholesteremia   . Hypertension    has not been to a cardiologist  . PONV (postoperative nausea and vomiting)   . Pre-diabetes   . Rectal vaginal fistula   . Refusal of blood transfusions as patient is [employer]     Past Surgical History:  Procedure Laterality Date  . APPENDECTOMY    . BUNIONECTOMY Right 01/09/2019   Procedure: RIGHT FOOT BUNION CORRECTION;  Surgeon: Erle Crocker, MD;  Location: [facility];  Service: Orthopedics;  Laterality: Right;  . EXPLORATORY LAPAROTOMY  1973   for endometrosis  . HAMMERTOE RECONSTRUCTION WITH WEIL OSTEOTOMY Right 01/09/2019   Procedure: RIGHT FOOT 2-4 HAMMERTOE CORRECTION WITH WEIL OSTEOTOMY 2ND METATARSAL, DEEP TENDON TRANSFER WITH AKIN OSTEOTOMY;  Surgeon: Erle Crocker, MD;  Location: [facility];   Service: Orthopedics;  Laterality: Right;  . I & D EXTREMITY Right 06/10/2016   Procedure: IRRIGATION AND DEBRIDEMENT THUMB FLEXOR SHEATH;  Surgeon: Leanora Cover, MD;  Location: [facility];  Service: Orthopedics;  Laterality: Right;  . JOINT REPLACEMENT     Left Hip  . [place]  . rectovaginal fistula repair  1980  . TOTAL HIP ARTHROPLASTY  05/08/2012   Procedure: TOTAL HIP ARTHROPLASTY;  Surgeon: Kerin Salen, MD;  Location: [facility];  Service: Orthopedics;  Laterality: Left;  . TOTAL HIP ARTHROPLASTY Right 09/18/2018   Procedure: TOTAL HIP ARTHROPLASTY ANTERIOR APPROACH;  Surgeon: Frederik Pear, MD;  Location: [facility];  Service: Orthopedics;  Laterality: Right;  . TOTAL KNEE ARTHROPLASTY Right 01/24/2013   Dr Mayer Camel  . TOTAL KNEE ARTHROPLASTY Right 01/24/2013   Procedure: RIGHT TOTAL KNEE ARTHROPLASTY;  Surgeon: Kerin Salen, MD;  Location: [facility];  Service: Orthopedics;  Laterality: Right;    Family History  Problem Relation Age of Onset  . Breast cancer Neg Hx     Social History   Tobacco Use  . Smoking status: Former Smoker    Packs/day: 1.00    Years: 10.00    Pack years: 10.00    Quit date: 11/08/1981    Years since quitting: 39.3  . Smokeless tobacco: Never Used  Vaping Use  . Vaping Use: Never  used  Substance Use Topics  . Alcohol use: No  . Drug use: No     Home Medications Prior to Admission medications   Medication Sig Start Date End Date Taking? Authorizing Provider  ascorbic acid (VITAMIN C) 500 MG tablet Take 1 tablet (500 mg total) by mouth daily. 10/31/19   Caren Griffins, MD  atenolol-chlorthalidone (TENORETIC) 50-25 MG tablet Take 1 tablet by mouth daily. 09/23/19   [provider]  Cholecalciferol (VITAMIN D) 50 MCG (2000 UT) tablet Take 2,000 Units by mouth daily.     [provider]  cyanocobalamin 1000 MCG tablet Take 1,000 mcg by mouth daily.     [provider]  dexamethasone (DECADRON) 6 MG tablet Take 1 tablet (6 mg  total) by mouth daily. 10/30/19   Caren Griffins, MD  diphenhydrAMINE (BENADRYL) 25 MG tablet Take 25 mg by mouth at bedtime.    [provider]  escitalopram (LEXAPRO) 10 MG tablet Take 10 mg by mouth daily.    [provider]  fluticasone (FLONASE) 50 MCG/ACT nasal spray Place 2 sprays into both nostrils daily as needed for allergies.  07/02/19   [provider]  furosemide (LASIX) 20 MG tablet Take 20 mg by mouth 3 (three) times a week.  09/23/19   [provider]  gabapentin (NEURONTIN) 300 MG capsule Take 300 mg by mouth at bedtime.  07/25/19   [provider]  gemfibrozil (LOPID) 600 MG tablet Take 600 mg by mouth 2 (two) times daily before a meal.    [provider]  guaiFENesin-dextromethorphan (ROBITUSSIN DM) 100-10 MG/5ML syrup Take 10 mLs by mouth every 4 (four) hours as needed for cough. 10/30/19   Caren Griffins, MD  Insulin Pen Needle 32G X 6 MM MISC Inject into the skin daily. 07/18/20   [provider]  naproxen sodium (ALEVE) 220 MG tablet 1 tablet with food or milk as needed    [provider]  omeprazole (PRILOSEC) 20 MG capsule Take 20 mg by mouth daily.    [provider]  oxybutynin (OXYTROL FOR WOMEN) 3.9 MG/24HR 1 patch to skin    [provider]  OZEMPIC, 0.25 OR 0.5 MG/DOSE, 2 MG/1.5ML SOPN Inject 0.5 mg into the skin once a week.  09/20/19   [provider]  Potassium 99 MG TABS Take 99 mg by mouth daily.    [provider]  TRESIBA FLEXTOUCH 100 UNIT/ML FlexTouch Pen Inject into the skin. 07/18/20   [provider]  triamcinolone ointment (KENALOG) 0.1 % APPLY TO THE LEGS ONCE TO TWICE DAILY 12/31/20   [provider]  zinc sulfate 220 (50 Zn) MG capsule Take 1 capsule (220 mg total) by mouth daily. 10/30/19   Caren Griffins, MD     Allergies    Other, Metronidazole, and Septra [sulfamethoxazole-trimethoprim]   Review of Systems    Review of Systems A comprehensive review of systems was completed and negative except as noted in HPI.    Physical Exam BP (!) 117/107 (BP Location: Left Arm)   Pulse 73   Temp 98.3 F (36.8 C) (Oral)   Resp 16   Ht 5\' 6"  (1.676 m)   Wt 108.9 kg   SpO2 95%   BMI 38.74 kg/m   Physical Exam Vitals and nursing note reviewed.  Constitutional:      Appearance: Normal appearance.  HENT:     Head: Normocephalic and atraumatic.     Nose: Nose normal.  Mouth/Throat:     Mouth: Mucous membranes are moist.  Eyes:     Extraocular Movements: Extraocular movements intact.     Conjunctiva/sclera: Conjunctivae normal.  Cardiovascular:     Rate and Rhythm: Normal rate.  Pulmonary:     Effort: Pulmonary effort is normal.     Breath sounds: Normal breath sounds.  Abdominal:     General: Abdomen is flat.     Palpations: Abdomen is soft.     Tenderness: There is abdominal tenderness in the right upper quadrant. There is no guarding. Negative signs include Murphy's sign and McBurney's sign.  Musculoskeletal:        General: No swelling. Normal range of motion.     Cervical back: Neck supple.  Skin:    General: Skin is warm and dry.  Neurological:     General: No focal deficit present.     Mental Status: She is alert.  Psychiatric:        Mood and Affect: Mood normal.      ED Results / Procedures / Treatments   Labs (all labs ordered are listed, but only abnormal results are displayed) Labs Reviewed  COMPREHENSIVE METABOLIC PANEL - Abnormal; Notable for the following components:      Result Value   Potassium 3.4 (*)    Glucose, Bld 179 (*)    Albumin 3.3 (*)    AST 115 (*)    ALT 112 (*)    Alkaline Phosphatase 206 (*)    Total Bilirubin 6.0 (*)    All other components within normal limits  CBC - Abnormal; Notable for the following components:   WBC 3.9 (*)    All other components within normal limits  LIPASE, BLOOD  URINALYSIS, ROUTINE W REFLEX MICROSCOPIC   PROTIME-INR    EKG None  Radiology No results found.  Procedures Procedures  Medications Ordered in the ED Medications  sodium chloride 0.9 % bolus 1,000 mL (has no administration in time range)  ondansetron (ZOFRAN-ODT) disintegrating tablet 4 mg (4 mg Oral Given 03/14/21 2115)     MDM Rules/Calculators/A&P MDM Patient's abdominal exam is benign at the time of my evaluation but her pain seems to be waxing and waning some. Suspect biliary colic, will check labs, [place] and reassess.   ED Course  I have reviewed the triage vital signs and the nursing notes.  Pertinent labs & imaging results that were available during my care of the patient were reviewed by me and considered in my medical decision making (see chart for details).  Clinical Course as of 03/14/21 2306  Sat Mar 14, 2021  2227 Liver enzymes are elevated, including AST/ALT, ALP and bilirubin 6.0. [CS]  2243 CBC with borderline low WBC. Otherwise normal.  [CS]  2254 Lab reports Lipase is requiring dilution, will be high but no exact value is reportable yet. [CS]  2306 Care of the patient signed out to Dr. Sedonia Small at the change of shift.  [CS]    Clinical Course User Index [CS] Truddie Hidden, MD    Final Clinical Impression(s) / ED Diagnoses Final diagnoses:  None    Rx / DC Orders ED Discharge Orders    None       Truddie Hidden, MD 03/14/21 2307

## 2021-03-14 NOTE — ED Notes (Signed)
Called made to on-call U/S per RN request. Nancy Nordmann

## 2021-03-14 NOTE — ED Triage Notes (Signed)
Patient arrives complaining of RUQ abdominal pain. Patient states that the pain began Tuesday and has gradually increased. Patient states she saw [facility] today and was noted to have elevated LFTs. Patient states her stools are grey and her urine is dark with an odor.

## 2021-03-14 NOTE — ED Provider Notes (Signed)
  Provider Note MRN:  [id number]  Arrival date & time: 03/15/21    ED Course and Medical Decision Making  Assumed care from Dr. Karle Starch at shift change.  Suspect gallstone pancreatitis, awaiting ultrasound and lipase, will need admission.  Labs reveal lipase of 3700, elevated LFTs and bilirubin.  Ultrasound concerning for cholecystitis, also possibly choledocholithiasis.  Discussed case with Dr. Roena Malady of [facility], who is recommending hospitalist admission, fluids, antibiotics, pain control with plan for MRCP, GI and general surgery evaluation in the morning.  Patient remains nontoxic-appearing with normal vital signs, no fever.  .Critical Care Performed by: Maudie Flakes, MD Authorized by: Maudie Flakes, MD   Critical care provider statement:    Critical care time (minutes):  33   Critical care was necessary to treat or prevent imminent or life-threatening deterioration of the following conditions: gall stone pancreatitis, cholecystitis.   Critical care was time spent personally by me on the following activities:  Discussions with consultants, evaluation of patient's response to treatment, examination of patient, ordering and performing treatments and interventions, ordering and review of laboratory studies, ordering and review of radiographic studies, pulse oximetry, re-evaluation of patient's condition, obtaining history from patient or surrogate and review of old charts   I assumed direction of critical care for this patient from another provider in my specialty: yes      Final Clinical Impressions(s) / ED Diagnoses     ICD-10-CM   1. Gall stone pancreatitis  K85.10     ED Discharge Orders    None      Discharge Instructions   None     Barth Kirks. Sedonia Small, [place] [email or web address]    Maudie Flakes, MD 03/15/21 [phone]

## 2021-03-15 ENCOUNTER — Other Ambulatory Visit (HOSPITAL_COMMUNITY): Payer: Medicare Other

## 2021-03-15 ENCOUNTER — Inpatient Hospital Stay (HOSPITAL_COMMUNITY): Payer: Medicare Other | Admitting: Anesthesiology

## 2021-03-15 ENCOUNTER — Encounter (HOSPITAL_COMMUNITY)
Admission: EM | Disposition: A | Discharge status: Home / Self Care | Payer: Self-pay | Source: Home / Self Care | Attending: Internal Medicine

## 2021-03-15 ENCOUNTER — Other Ambulatory Visit: Payer: Self-pay

## 2021-03-15 ENCOUNTER — Encounter (HOSPITAL_COMMUNITY): Payer: Self-pay | Admitting: Family Medicine

## 2021-03-15 ENCOUNTER — Inpatient Hospital Stay (HOSPITAL_COMMUNITY): Payer: Medicare Other

## 2021-03-15 DIAGNOSIS — I129 Hypertensive chronic kidney disease with stage 1 through stage 4 chronic kidney disease, or unspecified chronic kidney disease: Secondary | ICD-10-CM | POA: Diagnosis not present

## 2021-03-15 DIAGNOSIS — Z20822 Contact with and (suspected) exposure to covid-19: Secondary | ICD-10-CM | POA: Diagnosis not present

## 2021-03-15 DIAGNOSIS — K5730 Diverticulosis of large intestine without perforation or abscess without bleeding: Secondary | ICD-10-CM | POA: Diagnosis not present

## 2021-03-15 DIAGNOSIS — K810 Acute cholecystitis: Secondary | ICD-10-CM

## 2021-03-15 DIAGNOSIS — E1122 Type 2 diabetes mellitus with diabetic chronic kidney disease: Secondary | ICD-10-CM | POA: Diagnosis not present

## 2021-03-15 DIAGNOSIS — E669 Obesity, unspecified: Secondary | ICD-10-CM | POA: Diagnosis present

## 2021-03-15 DIAGNOSIS — N182 Chronic kidney disease, stage 2 (mild): Secondary | ICD-10-CM | POA: Diagnosis not present

## 2021-03-15 DIAGNOSIS — G4730 Sleep apnea, unspecified: Secondary | ICD-10-CM | POA: Diagnosis present

## 2021-03-15 DIAGNOSIS — Z87891 Personal history of nicotine dependence: Secondary | ICD-10-CM | POA: Diagnosis not present

## 2021-03-15 DIAGNOSIS — K8066 Calculus of gallbladder and bile duct with acute and chronic cholecystitis without obstruction: Secondary | ICD-10-CM | POA: Diagnosis not present

## 2021-03-15 DIAGNOSIS — Z882 Allergy status to sulfonamides status: Secondary | ICD-10-CM | POA: Diagnosis not present

## 2021-03-15 DIAGNOSIS — Z794 Long term (current) use of insulin: Secondary | ICD-10-CM | POA: Diagnosis not present

## 2021-03-15 DIAGNOSIS — Z96643 Presence of artificial hip joint, bilateral: Secondary | ICD-10-CM | POA: Diagnosis present

## 2021-03-15 DIAGNOSIS — K851 Biliary acute pancreatitis without necrosis or infection: Secondary | ICD-10-CM

## 2021-03-15 DIAGNOSIS — Z79899 Other long term (current) drug therapy: Secondary | ICD-10-CM | POA: Diagnosis not present

## 2021-03-15 DIAGNOSIS — E7800 Pure hypercholesterolemia, unspecified: Secondary | ICD-10-CM | POA: Diagnosis not present

## 2021-03-15 DIAGNOSIS — Z48815 Encounter for surgical aftercare following surgery on the digestive system: Secondary | ICD-10-CM | POA: Diagnosis not present

## 2021-03-15 DIAGNOSIS — K8062 Calculus of gallbladder and bile duct with acute cholecystitis without obstruction: Secondary | ICD-10-CM | POA: Diagnosis not present

## 2021-03-15 DIAGNOSIS — E559 Vitamin D deficiency, unspecified: Secondary | ICD-10-CM | POA: Diagnosis not present

## 2021-03-15 DIAGNOSIS — Z888 Allergy status to other drugs, medicaments and biological substances status: Secondary | ICD-10-CM | POA: Diagnosis not present

## 2021-03-15 DIAGNOSIS — R748 Abnormal levels of other serum enzymes: Secondary | ICD-10-CM | POA: Diagnosis not present

## 2021-03-15 DIAGNOSIS — K7460 Unspecified cirrhosis of liver: Secondary | ICD-10-CM | POA: Diagnosis present

## 2021-03-15 DIAGNOSIS — K219 Gastro-esophageal reflux disease without esophagitis: Secondary | ICD-10-CM | POA: Diagnosis present

## 2021-03-15 DIAGNOSIS — E1140 Type 2 diabetes mellitus with diabetic neuropathy, unspecified: Secondary | ICD-10-CM

## 2021-03-15 DIAGNOSIS — E876 Hypokalemia: Secondary | ICD-10-CM | POA: Diagnosis not present

## 2021-03-15 DIAGNOSIS — R945 Abnormal results of liver function studies: Secondary | ICD-10-CM | POA: Diagnosis not present

## 2021-03-15 DIAGNOSIS — K8012 Calculus of gallbladder with acute and chronic cholecystitis without obstruction: Secondary | ICD-10-CM | POA: Diagnosis not present

## 2021-03-15 DIAGNOSIS — R932 Abnormal findings on diagnostic imaging of liver and biliary tract: Secondary | ICD-10-CM | POA: Diagnosis not present

## 2021-03-15 DIAGNOSIS — K8050 Calculus of bile duct without cholangitis or cholecystitis without obstruction: Secondary | ICD-10-CM | POA: Diagnosis not present

## 2021-03-15 DIAGNOSIS — Z96651 Presence of right artificial knee joint: Secondary | ICD-10-CM | POA: Diagnosis present

## 2021-03-15 DIAGNOSIS — I10 Essential (primary) hypertension: Secondary | ICD-10-CM | POA: Diagnosis not present

## 2021-03-15 DIAGNOSIS — K760 Fatty (change of) liver, not elsewhere classified: Secondary | ICD-10-CM | POA: Diagnosis not present

## 2021-03-15 DIAGNOSIS — Z6839 Body mass index (BMI) 39.0-39.9, adult: Secondary | ICD-10-CM | POA: Diagnosis not present

## 2021-03-15 DIAGNOSIS — K8020 Calculus of gallbladder without cholecystitis without obstruction: Secondary | ICD-10-CM | POA: Diagnosis not present

## 2021-03-15 DIAGNOSIS — K429 Umbilical hernia without obstruction or gangrene: Secondary | ICD-10-CM | POA: Diagnosis not present

## 2021-03-15 HISTORY — PX: REMOVAL OF STONES: SHX5545

## 2021-03-15 HISTORY — PX: SPHINCTEROTOMY: SHX5544

## 2021-03-15 HISTORY — PX: ENDOSCOPIC RETROGRADE CHOLANGIOPANCREATOGRAPHY (ERCP) WITH PROPOFOL: SHX5810

## 2021-03-15 LAB — RESP PANEL BY RT-PCR (FLU A&B, COVID) ARPGX2
Influenza A by PCR: NEGATIVE
Influenza B by PCR: NEGATIVE
SARS Coronavirus 2 by RT PCR: NEGATIVE

## 2021-03-15 LAB — CBC
HCT: 34.7 % — ABNORMAL LOW (ref 36.0–46.0)
Hemoglobin: 11.4 g/dL — ABNORMAL LOW (ref 12.0–15.0)
MCH: 32.8 pg (ref 26.0–34.0)
MCHC: 32.9 g/dL (ref 30.0–36.0)
MCV: 99.7 fL (ref 80.0–100.0)
Platelets: 157 10*3/uL (ref 150–400)
RBC: 3.48 MIL/uL — ABNORMAL LOW (ref 3.87–5.11)
RDW: 14.5 % (ref 11.5–15.5)
WBC: 2.7 10*3/uL — ABNORMAL LOW (ref 4.0–10.5)
nRBC: 0 % (ref 0.0–0.2)

## 2021-03-15 LAB — COMPREHENSIVE METABOLIC PANEL
ALT: 111 U/L — ABNORMAL HIGH (ref 0–44)
AST: 123 U/L — ABNORMAL HIGH (ref 15–41)
Albumin: 2.8 g/dL — ABNORMAL LOW (ref 3.5–5.0)
Alkaline Phosphatase: 208 U/L — ABNORMAL HIGH (ref 38–126)
Anion gap: 9 (ref 5–15)
BUN: 10 mg/dL (ref 8–23)
CO2: 25 mmol/L (ref 22–32)
Calcium: 8.6 mg/dL — ABNORMAL LOW (ref 8.9–10.3)
Chloride: 103 mmol/L (ref 98–111)
Creatinine, Ser: 0.72 mg/dL (ref 0.44–1.00)
GFR, Estimated: >60 mL/min (ref >60)
Glucose, Bld: 168 mg/dL — ABNORMAL HIGH (ref 70–99)
Potassium: 3.2 mmol/L — ABNORMAL LOW (ref 3.5–5.1)
Sodium: 137 mmol/L (ref 135–145)
Total Bilirubin: 7 mg/dL — ABNORMAL HIGH (ref 0.3–1.2)
Total Protein: 5.9 g/dL — ABNORMAL LOW (ref 6.5–8.1)

## 2021-03-15 LAB — GLUCOSE, CAPILLARY
Glucose-Capillary: 143 mg/dL — ABNORMAL HIGH (ref 70–99)
Glucose-Capillary: 146 mg/dL — ABNORMAL HIGH (ref 70–99)
Glucose-Capillary: 174 mg/dL — ABNORMAL HIGH (ref 70–99)
Glucose-Capillary: 285 mg/dL — ABNORMAL HIGH (ref 70–99)

## 2021-03-15 LAB — HEMOGLOBIN A1C
Hgb A1c MFr Bld: 6.4 % — ABNORMAL HIGH (ref 4.8–5.6)
Mean Plasma Glucose: 136.98 mg/dL

## 2021-03-15 LAB — URINALYSIS, ROUTINE W REFLEX MICROSCOPIC
Glucose, UA: NEGATIVE mg/dL
Hgb urine dipstick: NEGATIVE
Ketones, ur: NEGATIVE mg/dL
Nitrite: NEGATIVE
Protein, ur: NEGATIVE mg/dL
Specific Gravity, Urine: 1.012 (ref 1.005–1.030)
pH: 7 (ref 5.0–8.0)

## 2021-03-15 LAB — PROTIME-INR
INR: 1.1 (ref 0.8–1.2)
Prothrombin Time: 13.8 seconds (ref 11.4–15.2)

## 2021-03-15 IMAGING — RF DG ERCP WO/W SPHINCTEROTOMY
1 series · 4 of 4 positions shown · non-contrast
Comparison: MRI abdomen [DATE]

CLINICAL DATA: Severe acute gallstone pancreatitis

EXAM:
ERCP
TECHNIQUE: Multiple spot images obtained with the fluoroscopic device and
submitted for interpretation post-procedure.
FLUOROSCOPY TIME:  Fluoroscopy Time:  1 minutes 52 seconds
Radiation Exposure Index (if provided by the fluoroscopic device):
59.54 mGy
Number of Acquired Spot Images: 4

[Series 1: run · 4 of 4 slices shown]
[im 1/4]
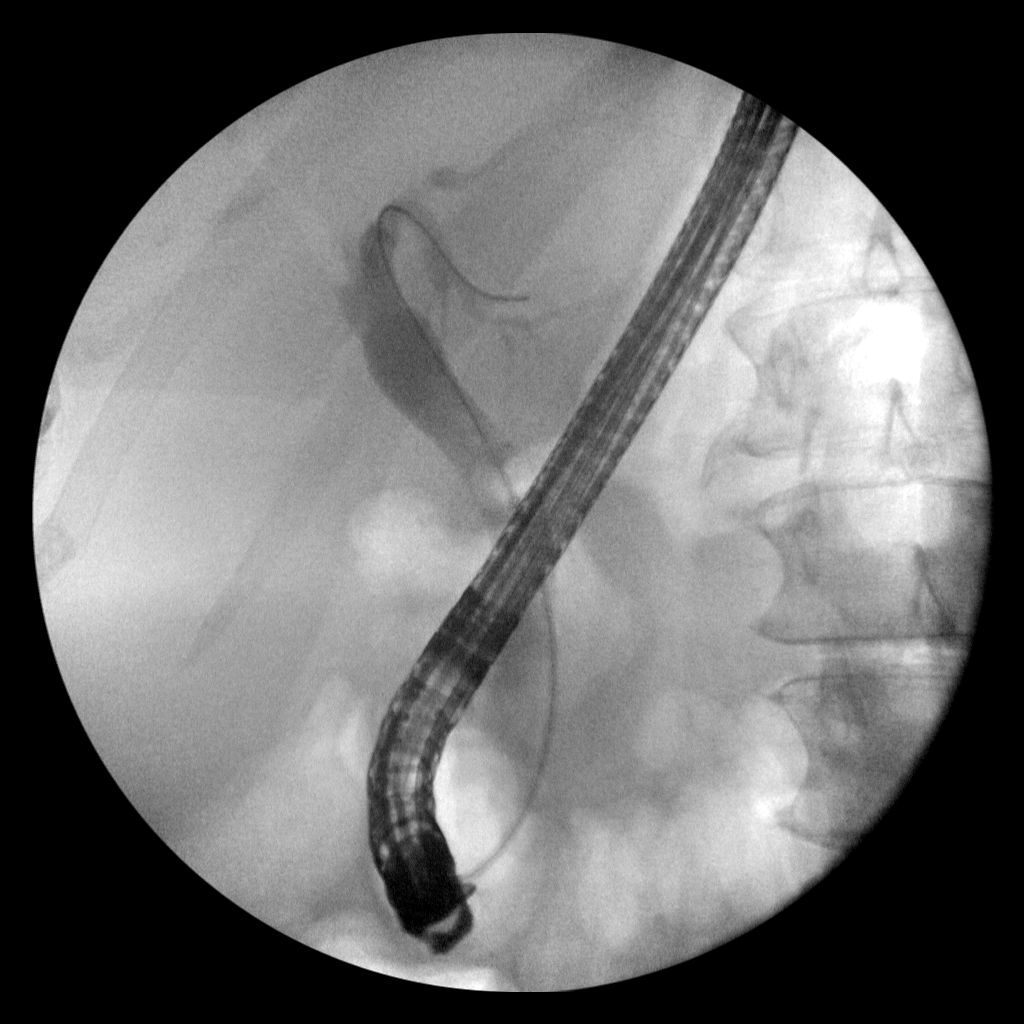
[im 2/4]
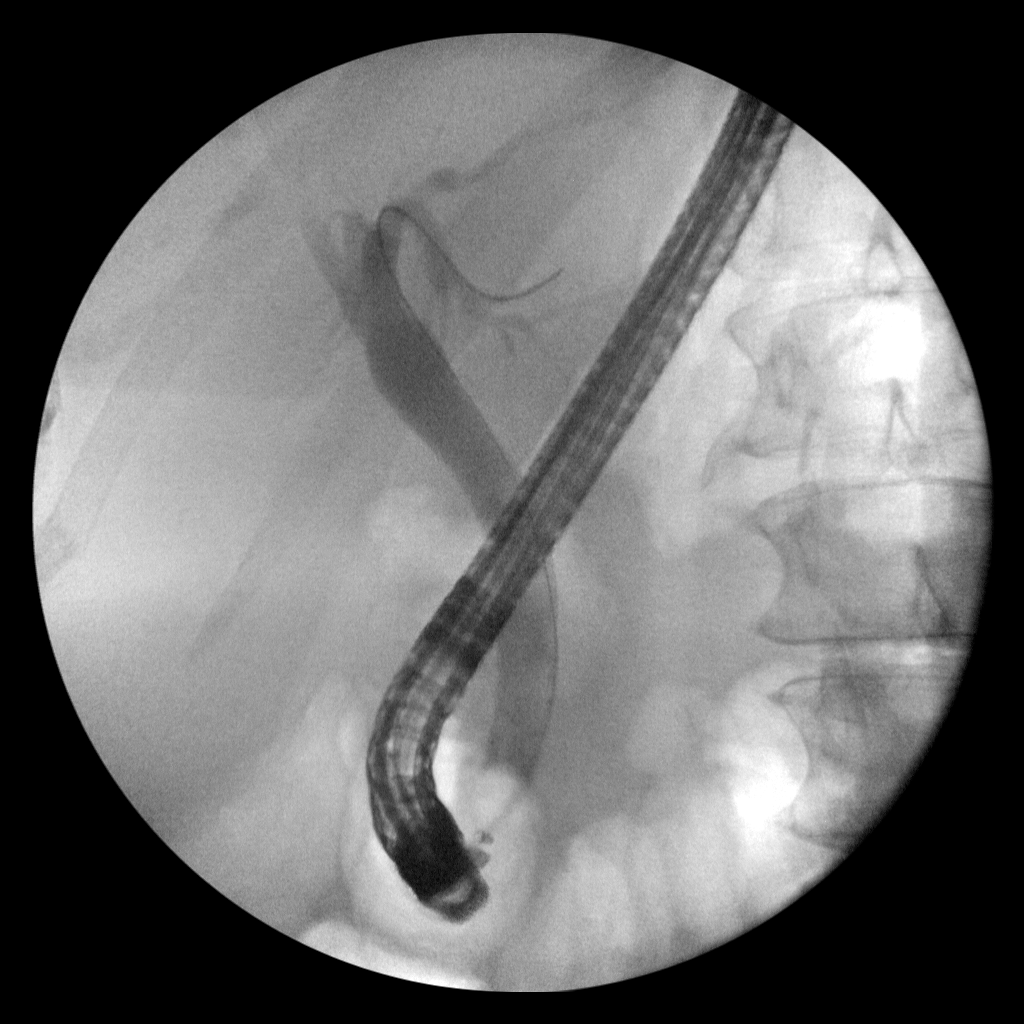
[im 3/4]
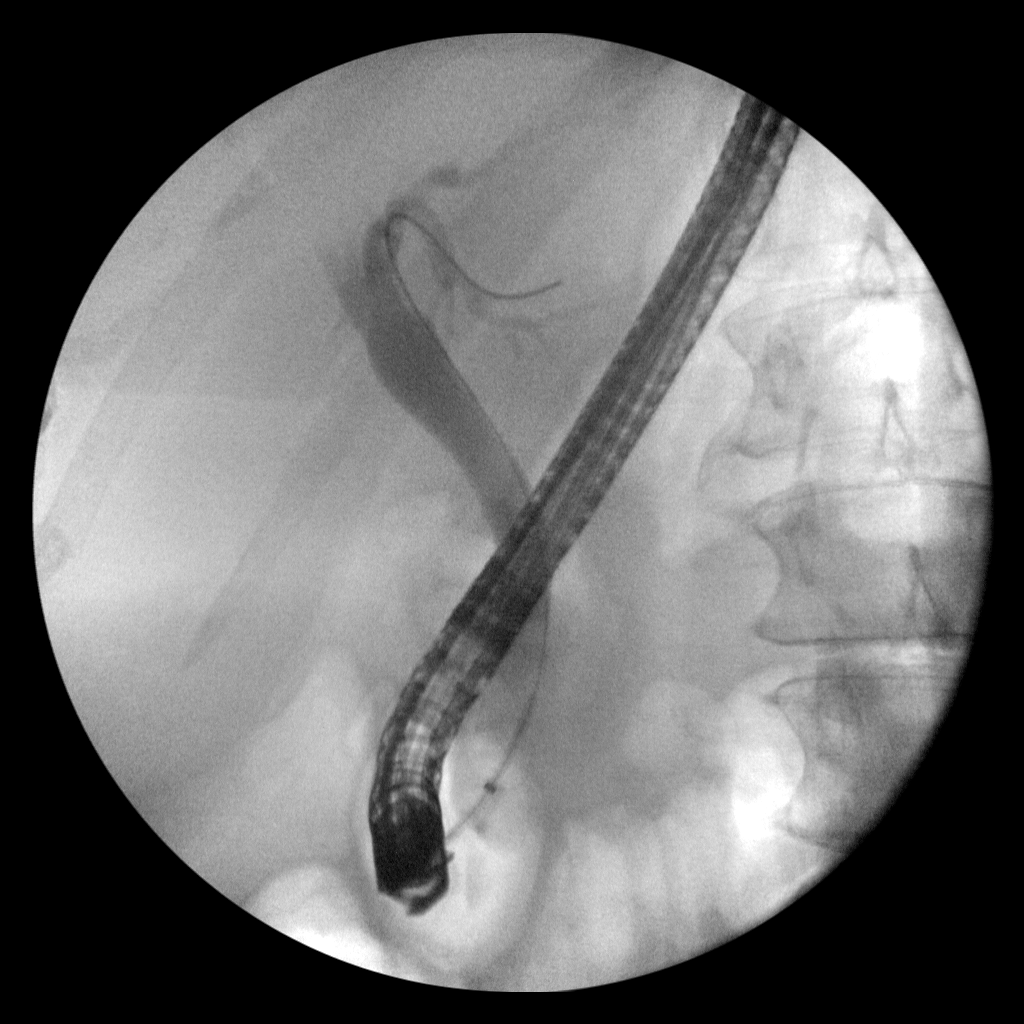
[im 4/4]
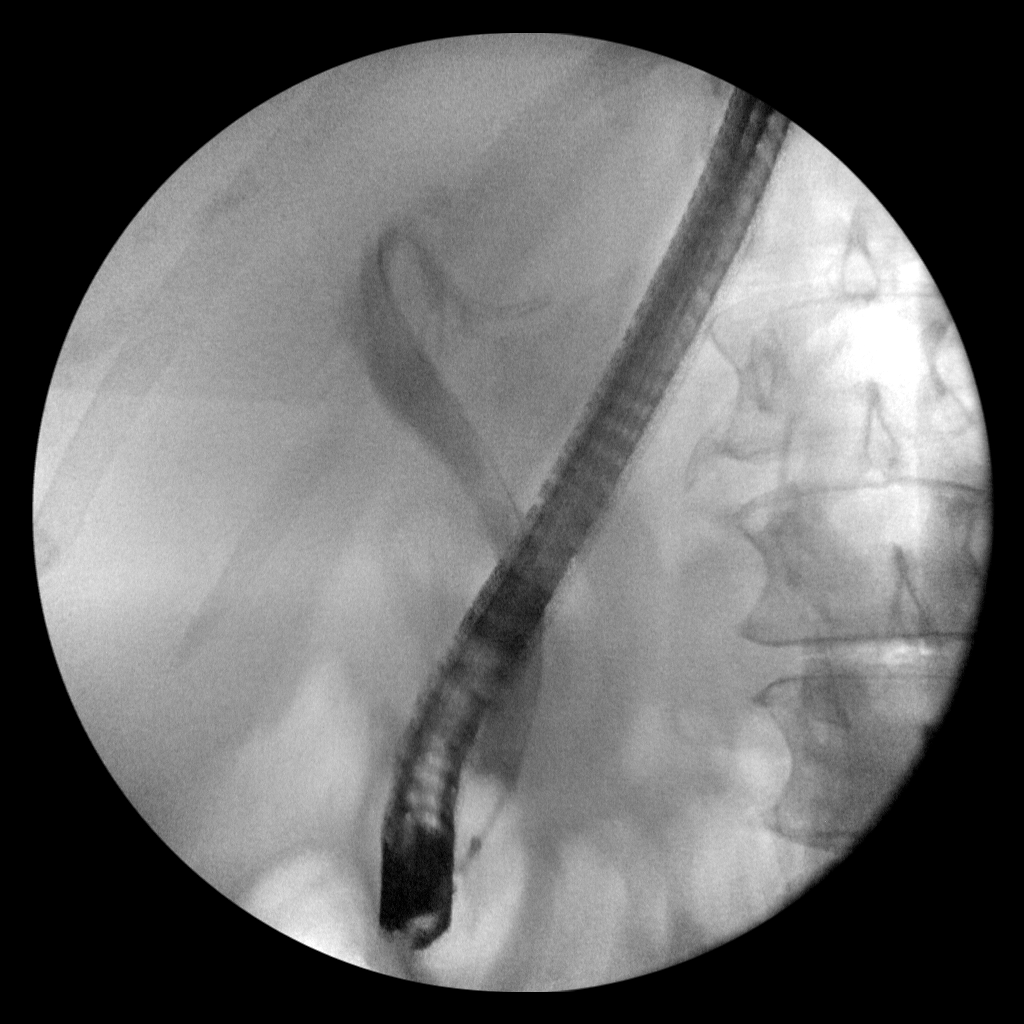

[4 of 4 positions shown; findings below may reference images not displayed]

FINDINGS: Submitted intraoperative fluoroscopic images demonstrate cannulation
and opacification of the common bile duct. No filling defects
identified within the common bile duct. Common bile duct appears
dilated. No significant opacification of the pancreatic duct.
IMPRESSION: ERCP as above.

These images were submitted for radiologic interpretation only.
Please see the procedural report for the amount of contrast and the
fluoroscopy time utilized.

## 2021-03-15 IMAGING — US US ABDOMEN LIMITED RUQ/ASCITES
1 series · 15 of 25 positions shown · non-contrast
Comparison: Ultrasound abdomen [DATE]

CLINICAL DATA: Right upper quadrant pain.

EXAM:
ULTRASOUND ABDOMEN LIMITED RIGHT UPPER QUADRANT

[Series 1: us abdomen limited mc & wl · 15 of 46 slices shown]
[im 1/46]
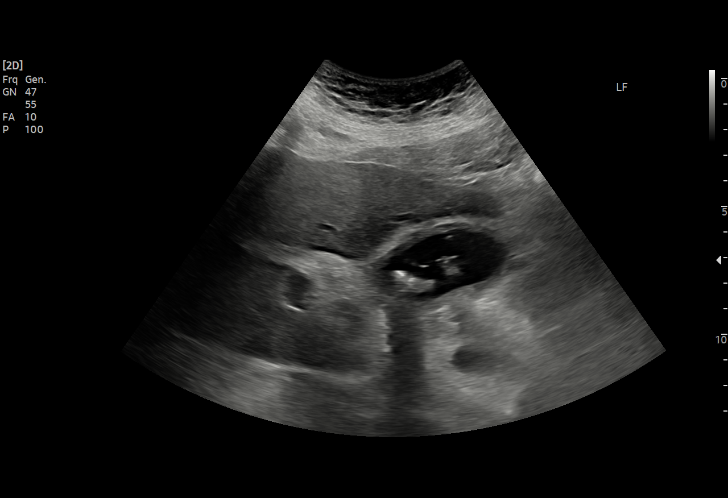
[im 4/46]
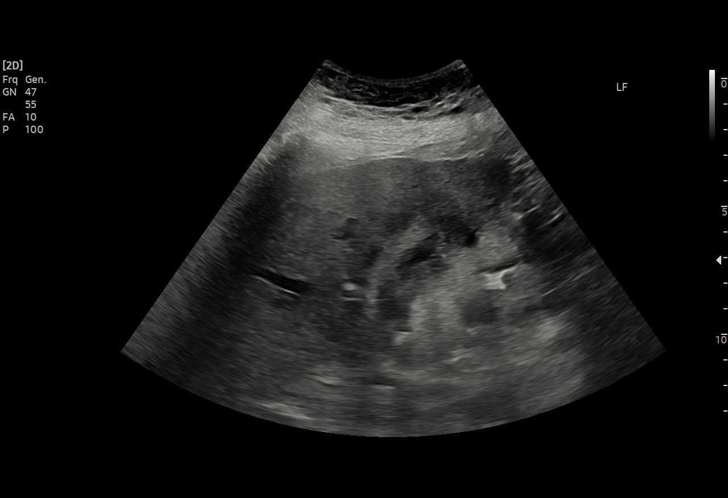
[im 8/46]
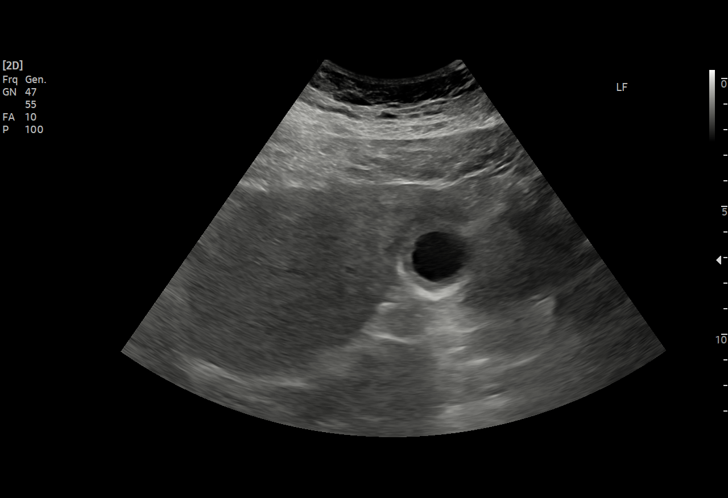
[im 10/46]
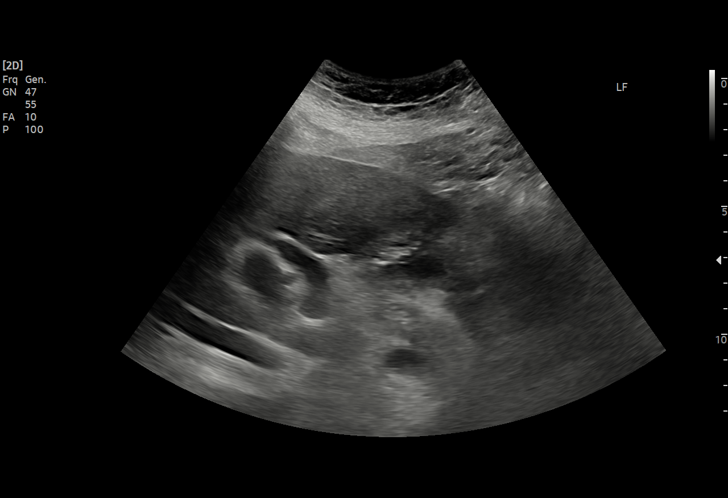
[im 14/46]
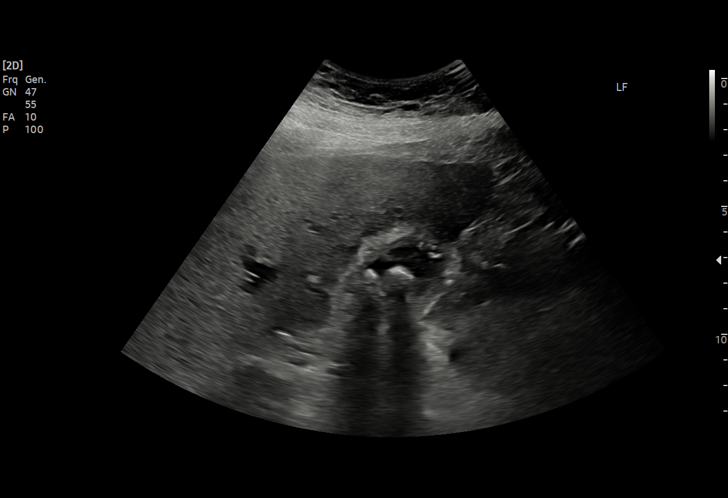
[im 17/46]
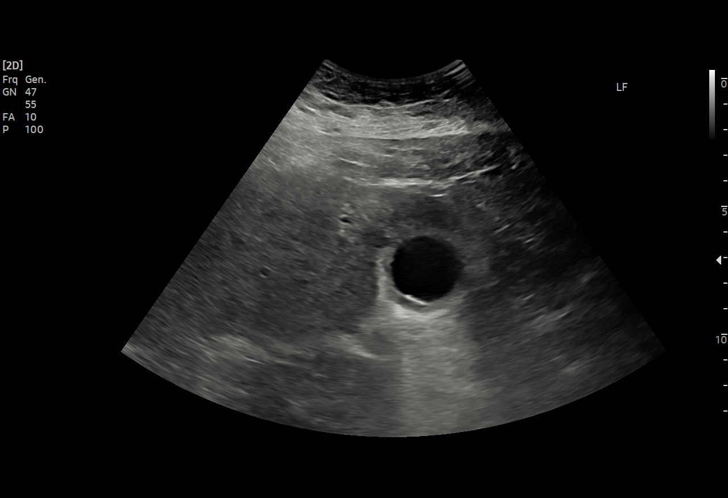
[im 19/46]
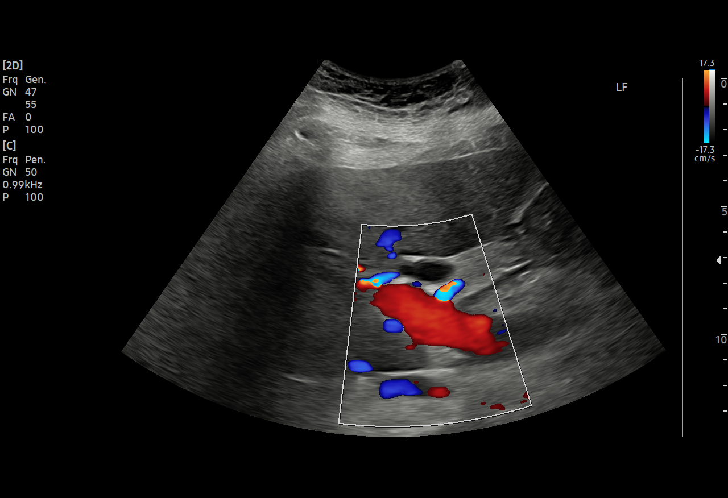
[im 23/46]
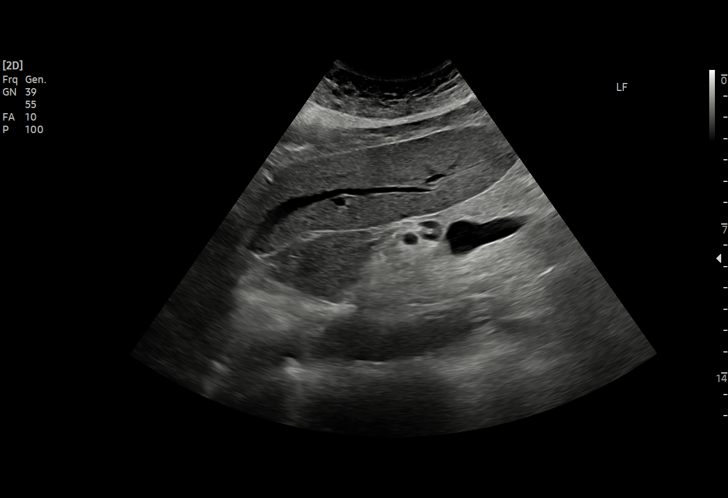
[im 27/46]
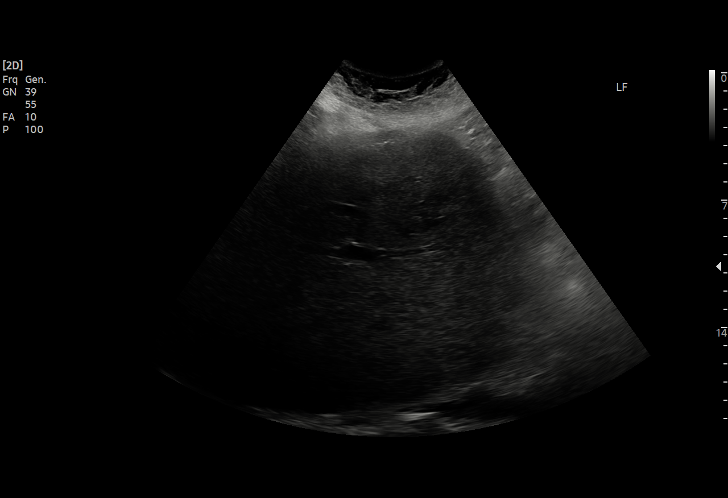
[im 29/46]
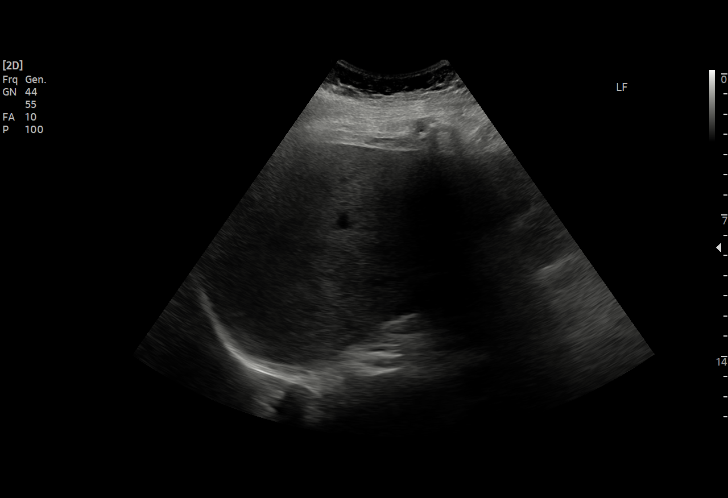
[im 32/46]
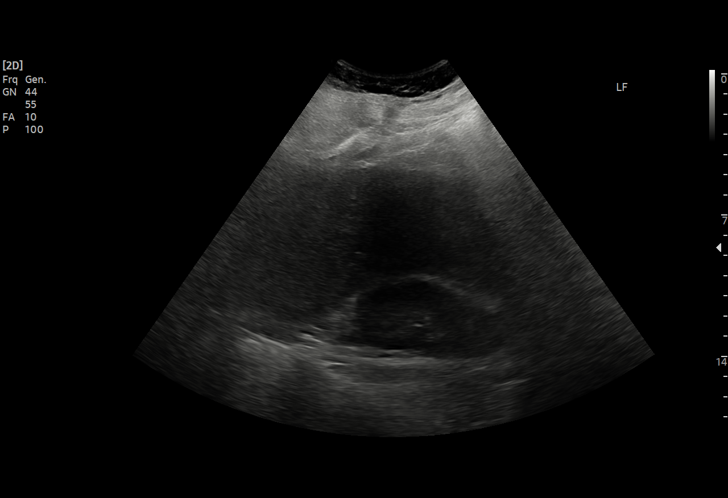
[im 36/46]
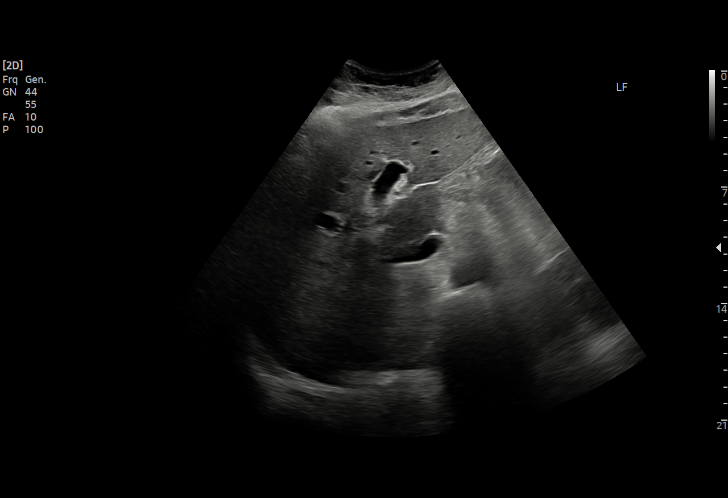
[im 38/46]
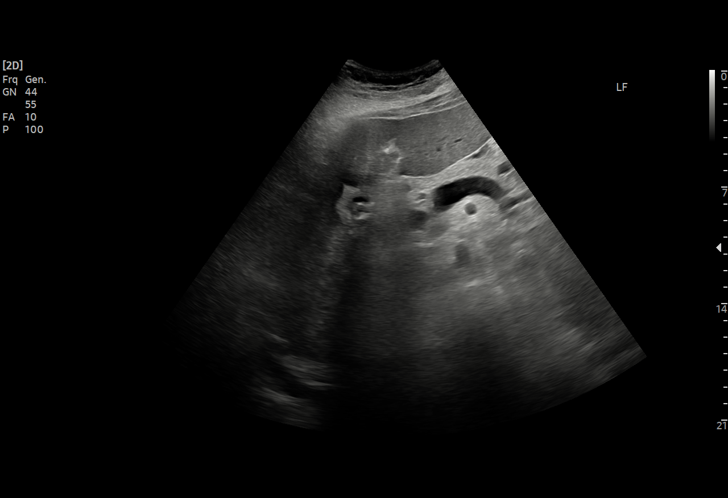
[im 42/46]
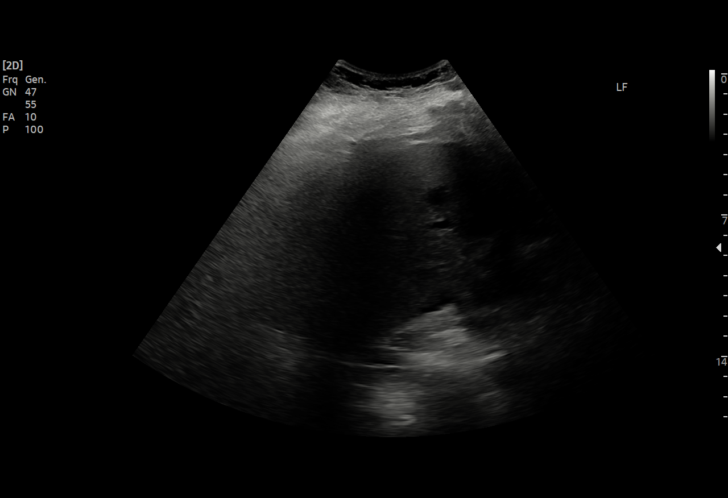
[im 46/46]
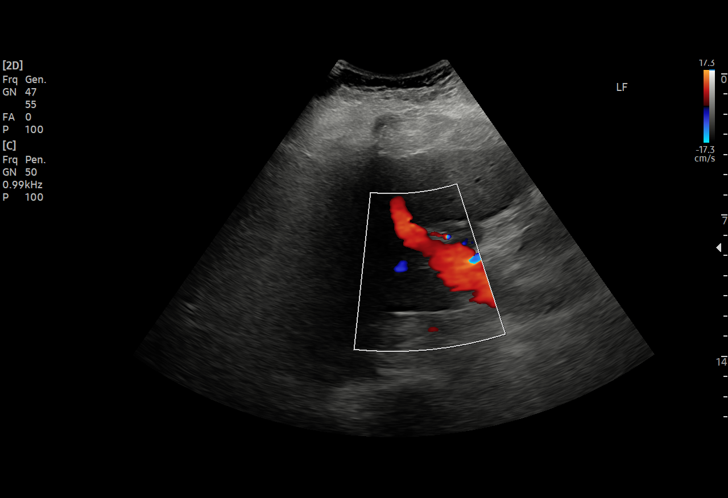

[15 of 25 positions shown; findings below may reference images not displayed]

FINDINGS: Gallbladder:

Calcified gallstones and gallbladder sludge within the gallbladder
lumen. Thickened gallbladder wall with associated pericholecystic
fluid. A positive sonographic Murphy sign noted by sonographer.

Common bile duct:

Diameter: 9 mm.

Liver:

No focal lesion identified. Increased parenchymal echogenicity.
Portal vein is patent on color Doppler imaging with normal direction
of blood flow towards the liver.

Other: None.
IMPRESSION: 1. Cholelithiasis and gallbladder sludge with associated acute
cholecystitis.
2. Choledocholithiasis not excluded in the setting of borderline
enlarged common bile duct (9 mm).
3. Hepatic steatosis. Please note limited evaluation for focal
hepatic masses in a patient with hepatic steatosis due to decreased
penetration of the acoustic ultrasound waves.

## 2021-03-15 IMAGING — MR MR ABDOMEN WO/W CM MRCP
17 of 21 series · 39 of 48 positions shown · IV contrast (gadavist)
Comparison: [DATE] abdominal sonogram.

CLINICAL DATA: Inpatient.  Severe acute gallstone pancreatitis.

EXAM:
MRI ABDOMEN WITHOUT AND WITH CONTRAST (INCLUDING MRCP)
TECHNIQUE: Multiplanar multisequence MR imaging of the abdomen was performed
both before and after the administration of intravenous contrast.
Heavily T2-weighted images of the biliary and pancreatic ducts were
obtained, and three-dimensional MRCP images were rendered by post
processing.
CONTRAST:  10mL GADAVIST GADOBUTROL 1 MMOL/ML IV SOLN

[Series 3: T2 fat-sat · axial · 6.0mm · 1.56mm/px · 1 of 45 slices shown]
[im 1/45]
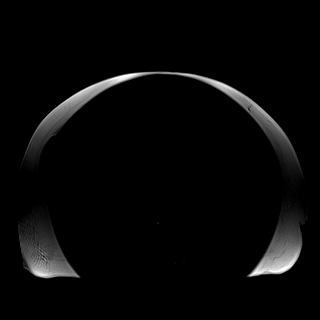

[Series 6: T2 · coronal · 6.0mm · 1.76mm/px · 1 of 38 slices shown (1 of 2)]
[im 1/38]
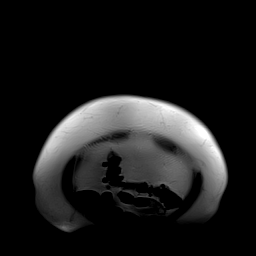

[Series 7: DWI · axial · 6.0mm · 1.68mm/px · z∈[-164,+138]mm · 3 of 86 slices shown (1 of 2)]
[im 1/86]
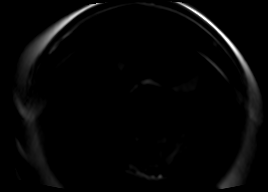
[im 43/86]
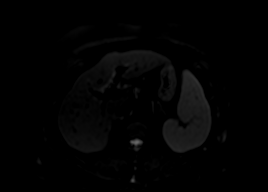
[im 86/86]
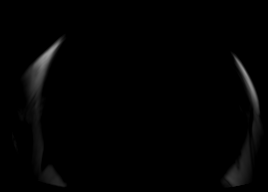

[Series 8: DWI · axial · 6.0mm · 1.68mm/px · 1 of 43 slices shown (2 of 2)]
[im 1/43]
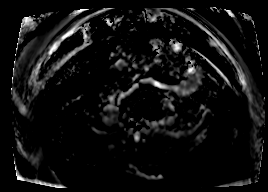

[Series 9: T1 · axial · 3.0mm · 1.41mm/px · z∈[-182,+103]mm · 3 of 96 slices shown (1 of 2)]
[im 1/96]
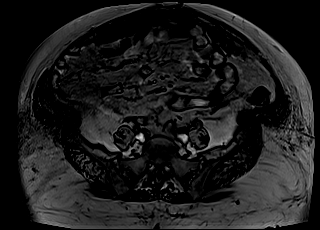
[im 48/96]
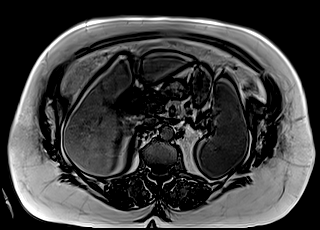
[im 96/96]
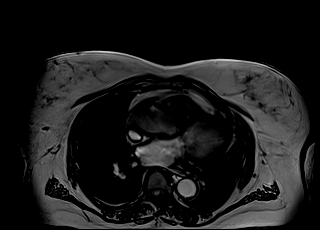

[Series 10: T1 · axial · 3.0mm · 1.41mm/px · z∈[-182,+103]mm · 3 of 96 slices shown (2 of 2)]
[im 1/96]
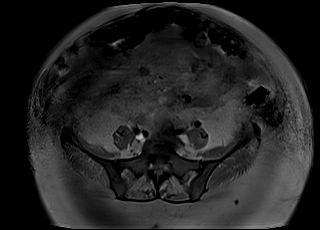
[im 48/96]
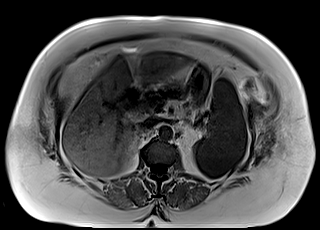
[im 96/96]
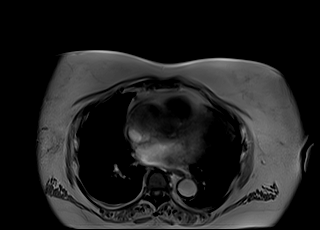

[Series 11: cor obl thk · sagittal · 50.0mm · 1.04mm/px · 1 of 9 slices shown]
[im 1/9]
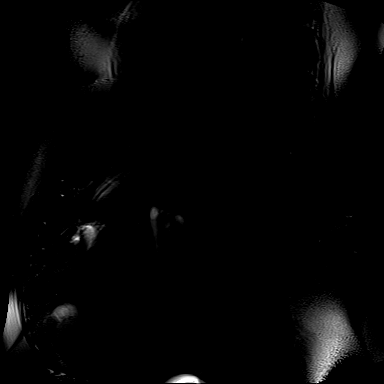

[Series 12: cor_3d_spc_trig-resp · 1 of 6 slices shown]
[im 1/6]
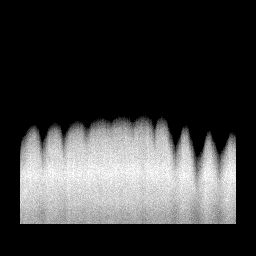

[Series 13: cor_3d_spc_trig · coronal · 1.0mm · 0.59mm/px · 3 of 88 slices shown]
[im 1/88]
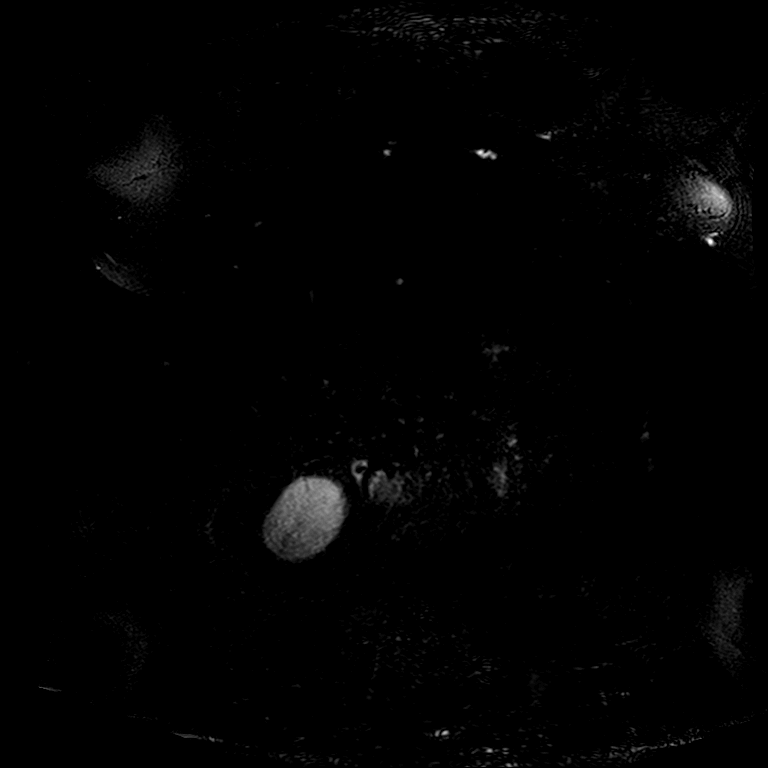
[im 44/88]
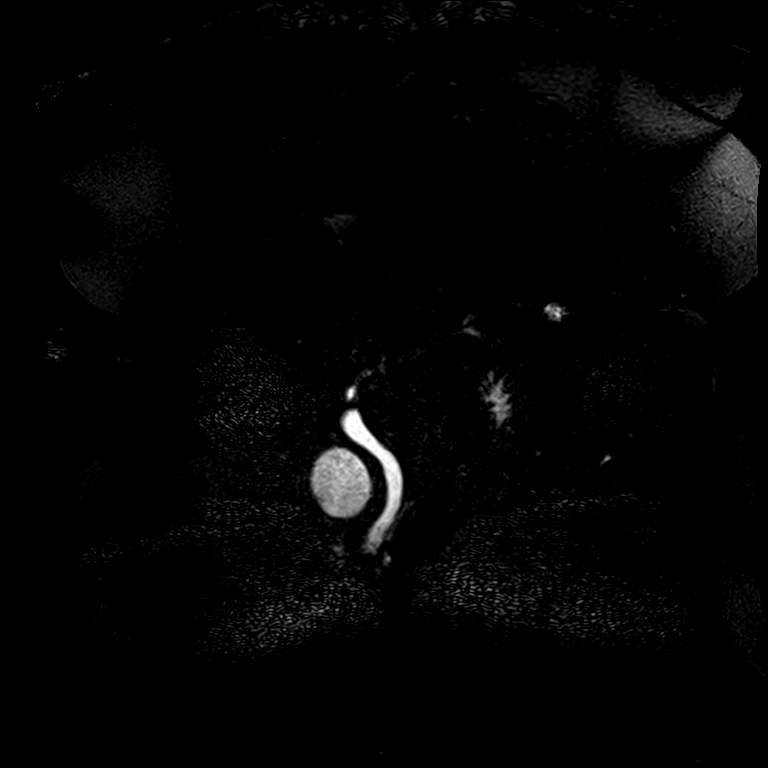
[im 88/88]
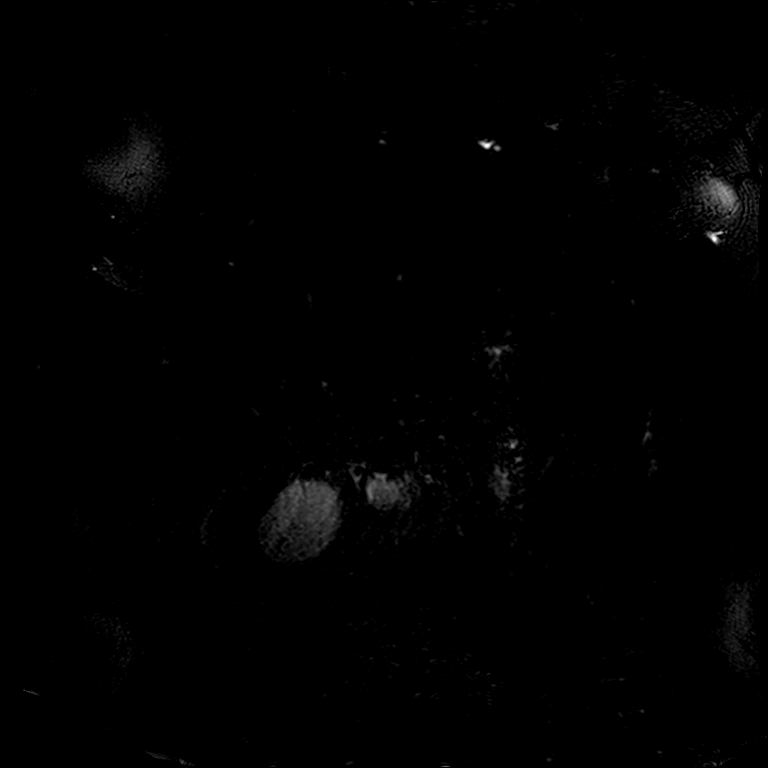

[Series 15: T2 · axial · 6.0mm · 1.76mm/px · 1 of 41 slices shown (2 of 2)]
[im 1/41]
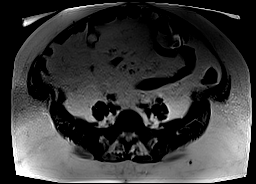

[Series 17: T1 dynamic · axial · 3.0mm · 1.41mm/px · z∈[-182,+103]mm · 3 of 96 slices shown (1 of 7)]
[im 1/96]
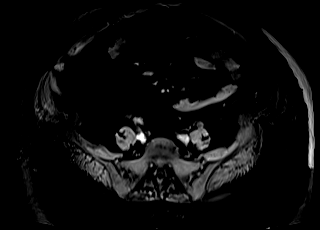
[im 48/96]
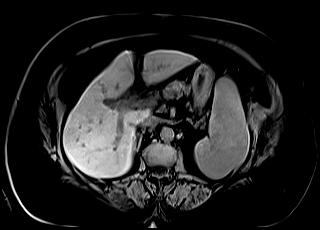
[im 96/96]
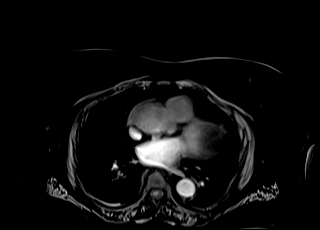

[Series 21: T1 dynamic · axial · 3.0mm · 1.41mm/px · z∈[-182,+103]mm · 3 of 96 slices shown (2 of 7)]
[im 1/96]
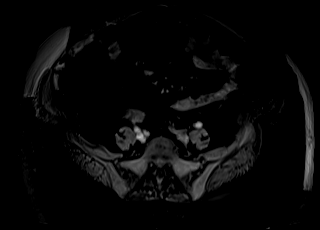
[im 48/96]
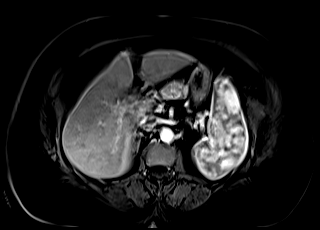
[im 96/96]
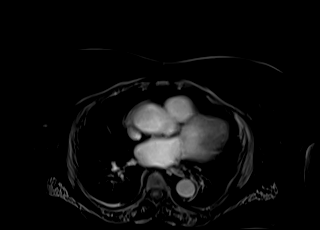

[Series 22: T1 dynamic · axial · 3.0mm · 1.41mm/px · z∈[-182,+103]mm · 3 of 96 slices shown (3 of 7)]
[im 1/96]
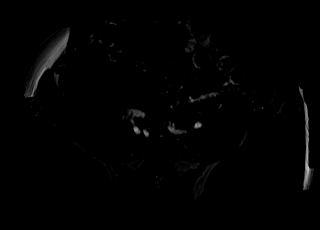
[im 48/96]
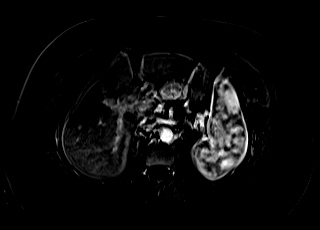
[im 96/96]
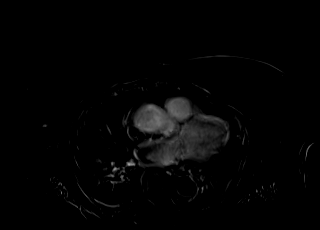

[Series 25: T1 dynamic · axial · 3.0mm · 1.41mm/px · z∈[-182,+103]mm · 3 of 96 slices shown (4 of 7)]
[im 1/96]
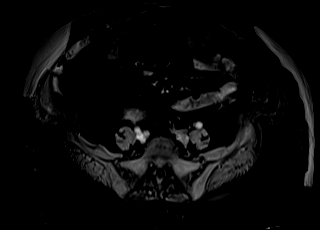
[im 48/96]
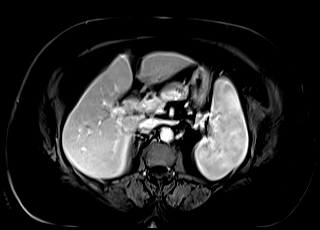
[im 96/96]
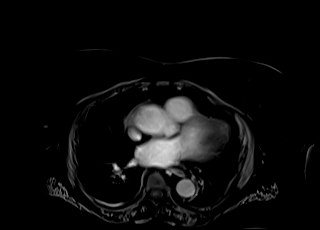

[Series 26: T1 dynamic · axial · 3.0mm · 1.41mm/px · z∈[-182,+103]mm · 3 of 96 slices shown (5 of 7)]
[im 1/96]
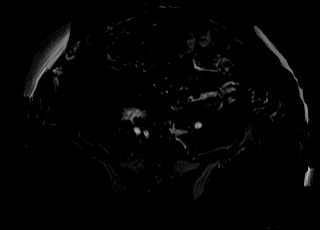
[im 48/96]
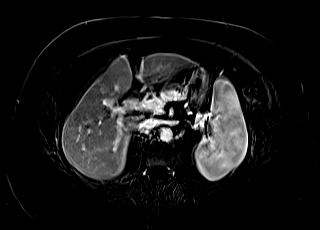
[im 96/96]
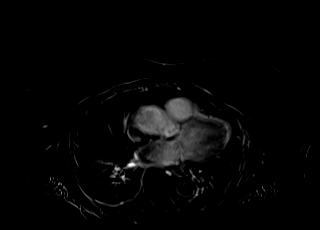

[Series 29: T1 dynamic · axial · 3.0mm · 1.41mm/px · z∈[-182,+103]mm · 3 of 96 slices shown (6 of 7)]
[im 1/96]
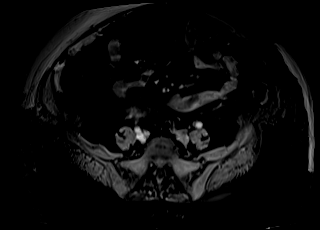
[im 48/96]
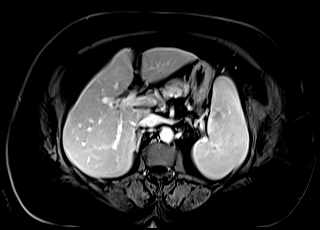
[im 96/96]
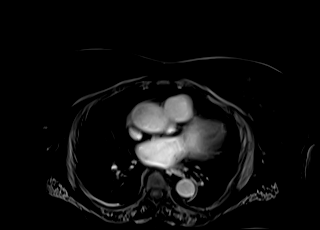

[Series 30: T1 dynamic · axial · 3.0mm · 1.41mm/px · z∈[-182,+103]mm · 3 of 96 slices shown (7 of 7)]
[im 1/96]
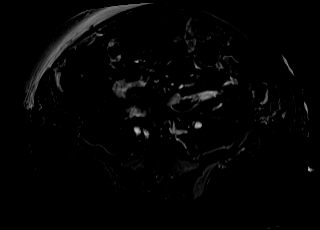
[im 48/96]
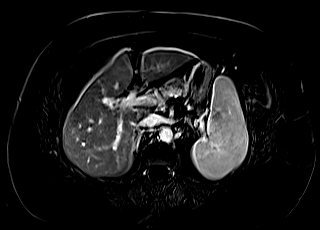
[im 96/96]
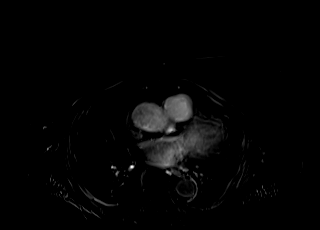

[39 of 48 positions shown; findings below may reference images not displayed]

FINDINGS: Lower chest: No acute abnormality at the lung bases. Mild
cardiomegaly.

Hepatobiliary: Normal liver size and configuration. No significant
hepatic steatosis. No liver mass. Several layering gallstones in the
mildly distended gallbladder, largest 9 mm. Mild diffuse gallbladder
wall thickening. No significant pericholecystic fluid. Minimal
central intrahepatic biliary ductal dilatation. Dilated common bile
duct with diameter 10 mm. There is a 4 mm stone at the ampulla
(series 3/image 36). Otherwise no stones in the bile ducts. No
enhancing biliary masses, biliary strictures or beading of the bile
ducts.

Pancreas: Very mild peripancreatic edema in the pancreatic head with
preserved pancreatic parenchymal enhancement, compatible with acute
non necrotizing pancreatitis. No pancreatic mass or duct dilation.
No pancreas divisum.

Spleen: Mild-to-moderate splenomegaly (craniocaudal splenic length
15.1 cm. Two small cystic splenic lesions, largest 1.3 cm with thin
internal septations and no wall thickening or solid enhancement,
compatible with lymphangiomas.

Adrenals/Urinary Tract: Normal adrenals. No hydronephrosis. Tiny
simple parapelvic renal cysts, left greater than right. No
suspicious renal masses.

Stomach/Bowel: Normal non-distended stomach. Visualized small and
large bowel is normal caliber, with no bowel wall thickening. Marked
diffuse colonic diverticulosis.

Vascular/Lymphatic: Atherosclerotic nonaneurysmal abdominal aorta.
Patent portal, splenic, hepatic and renal veins. No pathologically
enlarged lymph nodes in the abdomen.

Other: No abdominal ascites or focal fluid collection.

Musculoskeletal: No aggressive appearing focal osseous lesions.
IMPRESSION: 1. Mild acute non-necrotizing pancreatitis in the pancreatic head.
2. Solitary 4 mm ampullary stone. Dilated (10 mm diameter) common
bile duct. Minimal central intrahepatic biliary ductal dilatation.
3. Cholelithiasis. Mild diffuse gallbladder wall thickening. No
significant pericholecystic fluid.
4. Mild-to-moderate splenomegaly.
5. Marked diffuse colonic diverticulosis.

## 2021-03-15 IMAGING — MR MR 3D RECON AT [facility]
17 of 21 series · 39 of 48 positions shown · IV contrast (gadavist)
Comparison: [DATE] abdominal sonogram.

CLINICAL DATA: Inpatient.  Severe acute gallstone pancreatitis.

EXAM:
MRI ABDOMEN WITHOUT AND WITH CONTRAST (INCLUDING MRCP)
TECHNIQUE: Multiplanar multisequence MR imaging of the abdomen was performed
both before and after the administration of intravenous contrast.
Heavily T2-weighted images of the biliary and pancreatic ducts were
obtained, and three-dimensional MRCP images were rendered by post
processing.
CONTRAST:  10mL GADAVIST GADOBUTROL 1 MMOL/ML IV SOLN

[Series 3: T2 fat-sat · axial · 6.0mm · 1.56mm/px · 1 of 45 slices shown]
[im 1/45]
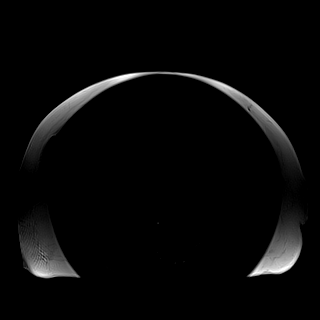

[Series 6: T2 · coronal · 6.0mm · 1.76mm/px · 1 of 38 slices shown (1 of 2)]
[im 1/38]
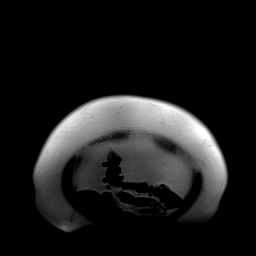

[Series 7: DWI · axial · 6.0mm · 1.68mm/px · z∈[-164,+138]mm · 3 of 86 slices shown (1 of 2)]
[im 1/86]
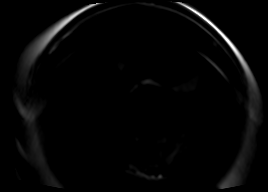
[im 43/86]
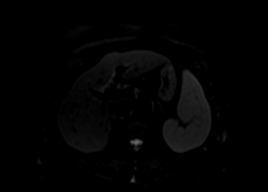
[im 86/86]
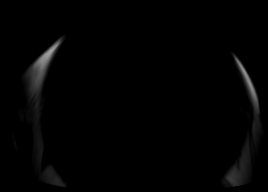

[Series 8: DWI · axial · 6.0mm · 1.68mm/px · 1 of 43 slices shown (2 of 2)]
[im 1/43]
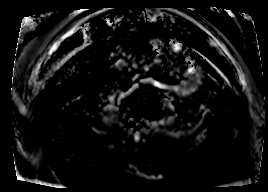

[Series 9: T1 · axial · 3.0mm · 1.41mm/px · z∈[-182,+103]mm · 3 of 96 slices shown (1 of 2)]
[im 1/96]
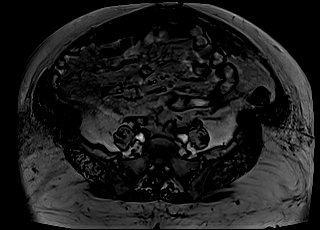
[im 48/96]
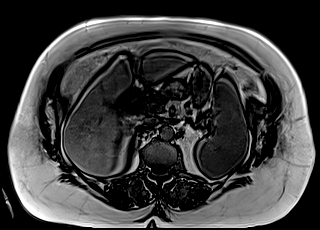
[im 96/96]
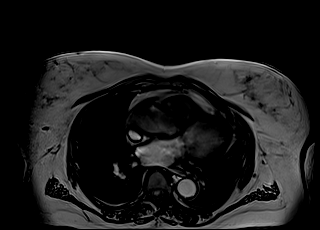

[Series 10: T1 · axial · 3.0mm · 1.41mm/px · z∈[-182,+103]mm · 3 of 96 slices shown (2 of 2)]
[im 1/96]
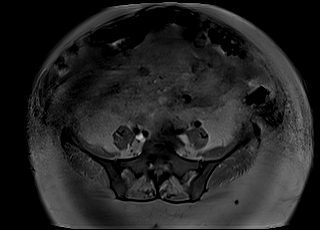
[im 48/96]
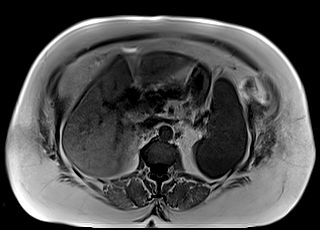
[im 96/96]
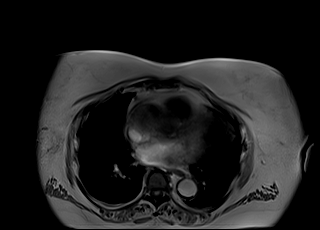

[Series 11: cor obl thk · sagittal · 50.0mm · 1.04mm/px · 1 of 9 slices shown]
[im 1/9]
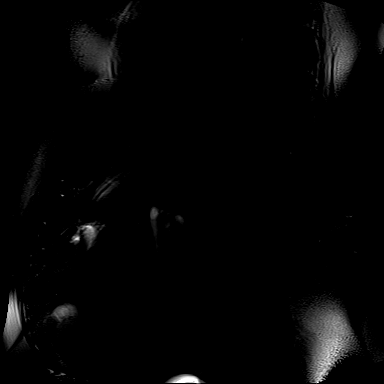

[Series 12: cor_3d_spc_trig-resp · 1 of 6 slices shown]
[im 1/6]
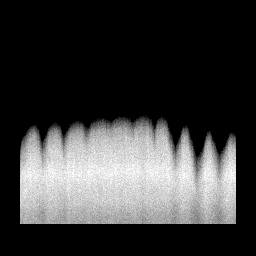

[Series 13: cor_3d_spc_trig · coronal · 1.0mm · 0.59mm/px · 3 of 88 slices shown]
[im 1/88]
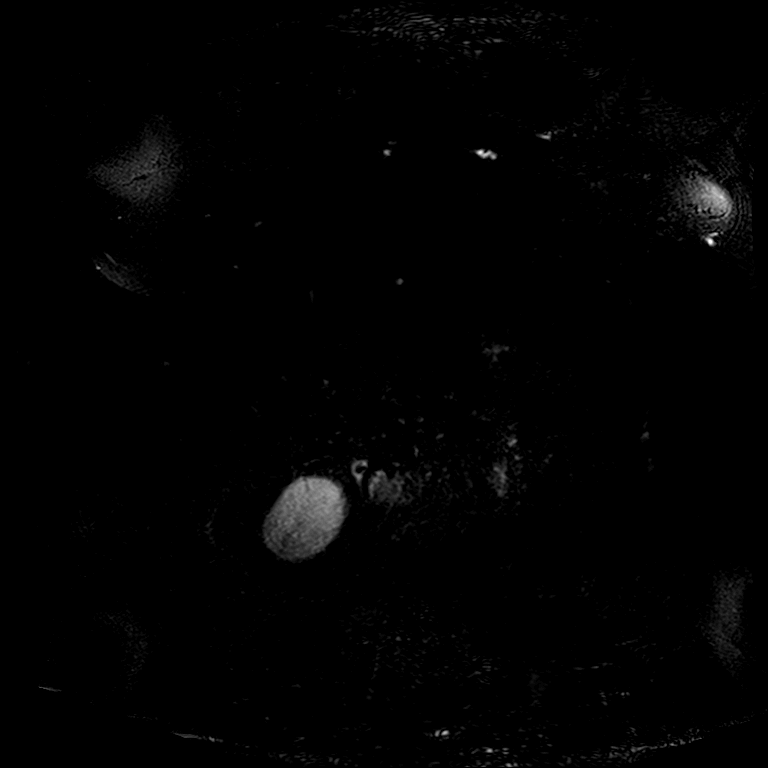
[im 44/88]
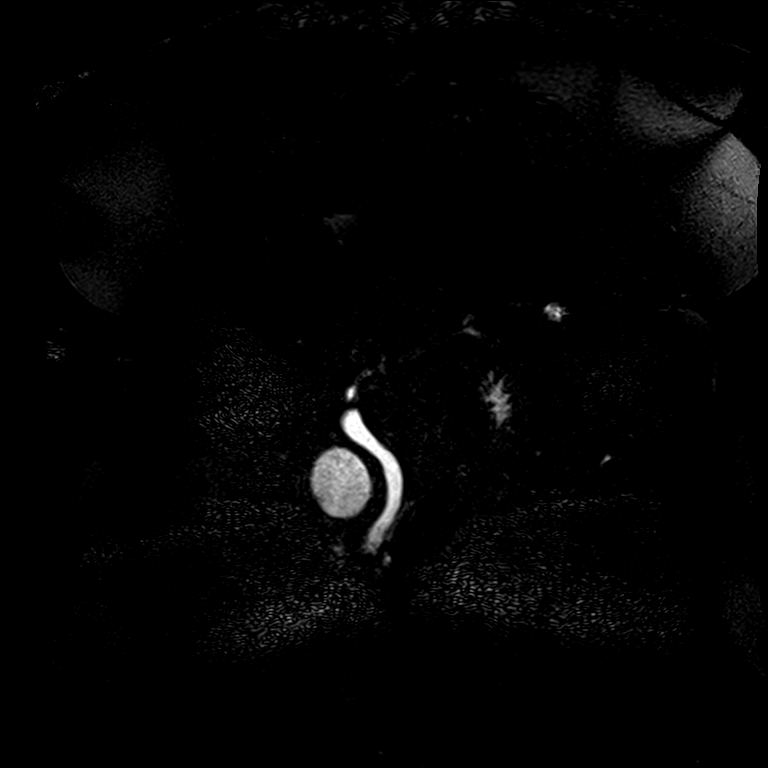
[im 88/88]
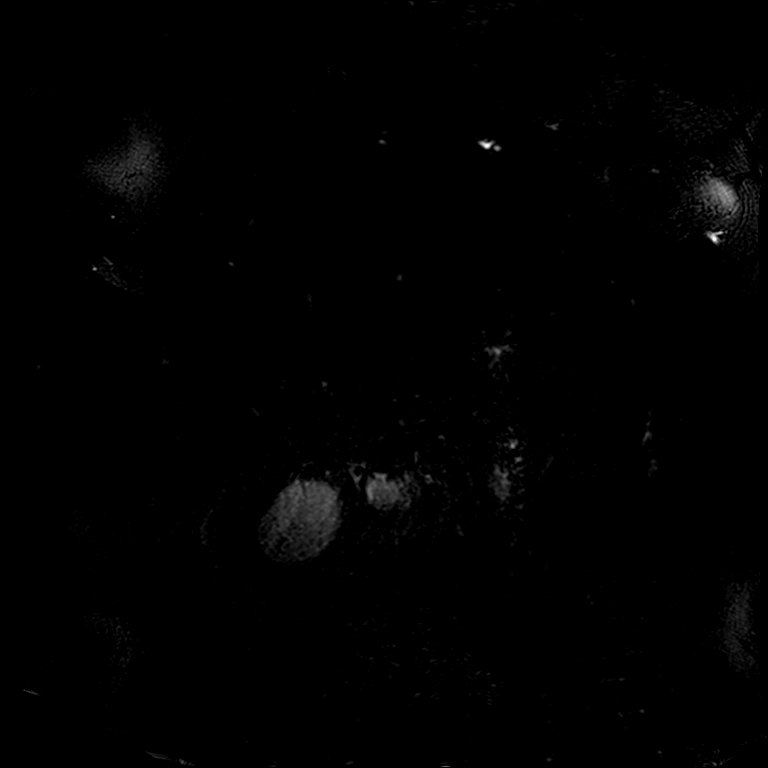

[Series 15: T2 · axial · 6.0mm · 1.76mm/px · 1 of 41 slices shown (2 of 2)]
[im 1/41]
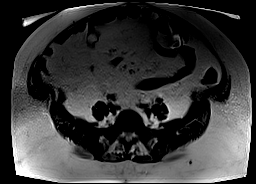

[Series 17: T1 dynamic · axial · 3.0mm · 1.41mm/px · z∈[-182,+103]mm · 3 of 96 slices shown (1 of 7)]
[im 1/96]
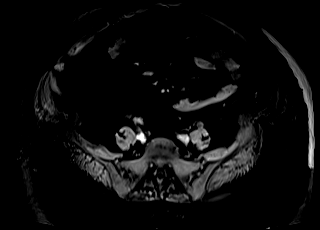
[im 48/96]
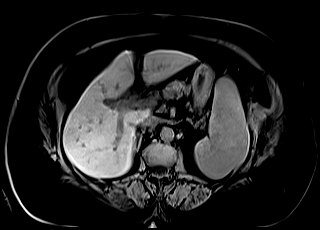
[im 96/96]
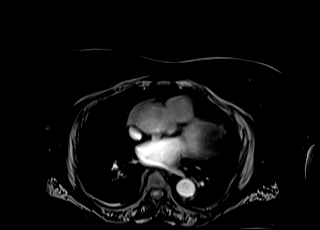

[Series 21: T1 dynamic · axial · 3.0mm · 1.41mm/px · z∈[-182,+103]mm · 3 of 96 slices shown (2 of 7)]
[im 1/96]
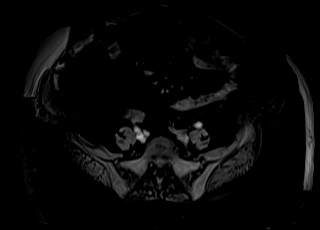
[im 48/96]
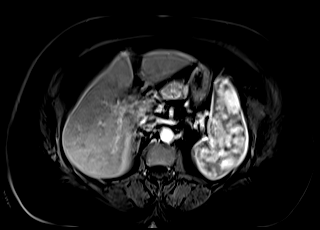
[im 96/96]
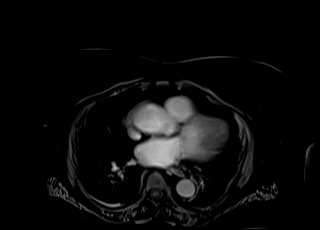

[Series 22: T1 dynamic · axial · 3.0mm · 1.41mm/px · z∈[-182,+103]mm · 3 of 96 slices shown (3 of 7)]
[im 1/96]
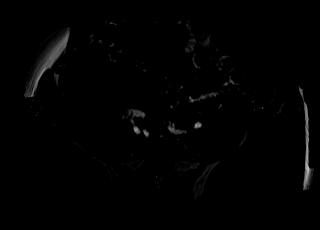
[im 48/96]
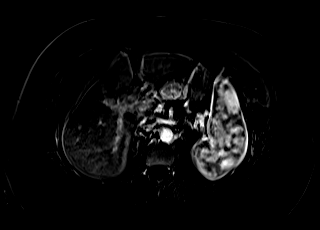
[im 96/96]
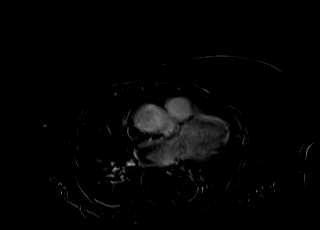

[Series 25: T1 dynamic · axial · 3.0mm · 1.41mm/px · z∈[-182,+103]mm · 3 of 96 slices shown (4 of 7)]
[im 1/96]
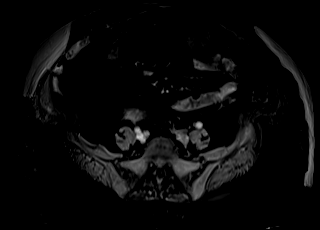
[im 48/96]
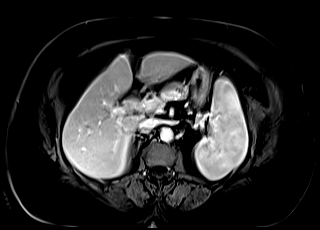
[im 96/96]
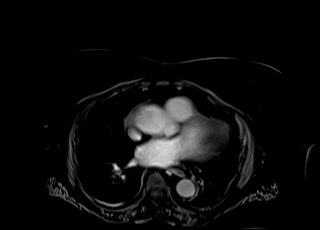

[Series 26: T1 dynamic · axial · 3.0mm · 1.41mm/px · z∈[-182,+103]mm · 3 of 96 slices shown (5 of 7)]
[im 1/96]
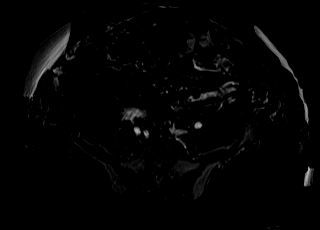
[im 48/96]
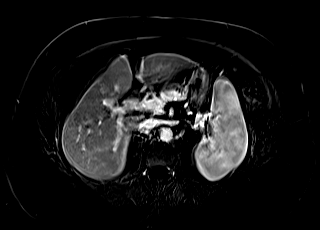
[im 96/96]
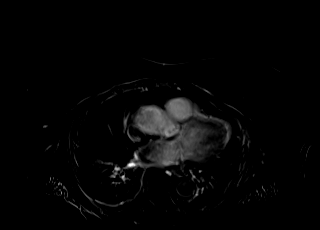

[Series 29: T1 dynamic · axial · 3.0mm · 1.41mm/px · z∈[-182,+103]mm · 3 of 96 slices shown (6 of 7)]
[im 1/96]
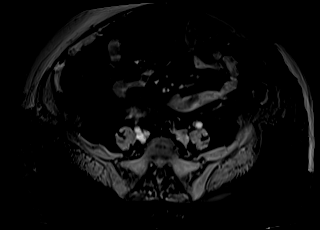
[im 48/96]
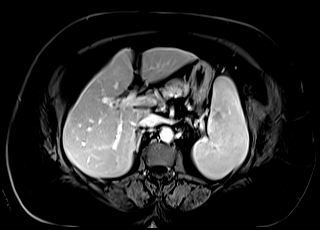
[im 96/96]
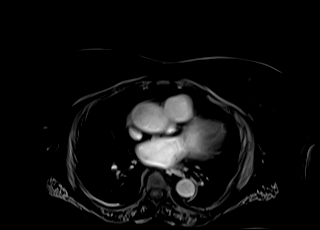

[Series 30: T1 dynamic · axial · 3.0mm · 1.41mm/px · z∈[-182,+103]mm · 3 of 96 slices shown (7 of 7)]
[im 1/96]
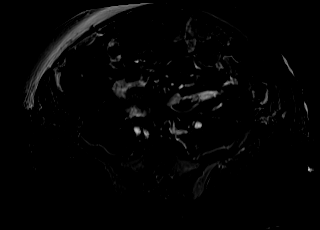
[im 48/96]
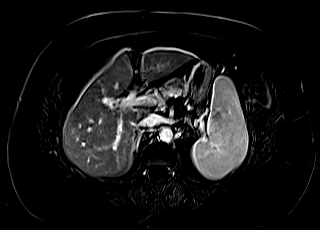
[im 96/96]
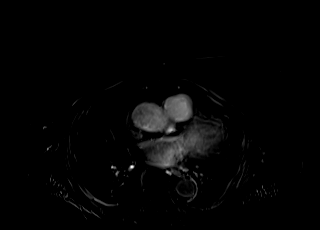

[39 of 48 positions shown; findings below may reference images not displayed]

FINDINGS: Lower chest: No acute abnormality at the lung bases. Mild
cardiomegaly.

Hepatobiliary: Normal liver size and configuration. No significant
hepatic steatosis. No liver mass. Several layering gallstones in the
mildly distended gallbladder, largest 9 mm. Mild diffuse gallbladder
wall thickening. No significant pericholecystic fluid. Minimal
central intrahepatic biliary ductal dilatation. Dilated common bile
duct with diameter 10 mm. There is a 4 mm stone at the ampulla
(series 3/image 36). Otherwise no stones in the bile ducts. No
enhancing biliary masses, biliary strictures or beading of the bile
ducts.

Pancreas: Very mild peripancreatic edema in the pancreatic head with
preserved pancreatic parenchymal enhancement, compatible with acute
non necrotizing pancreatitis. No pancreatic mass or duct dilation.
No pancreas divisum.

Spleen: Mild-to-moderate splenomegaly (craniocaudal splenic length
15.1 cm. Two small cystic splenic lesions, largest 1.3 cm with thin
internal septations and no wall thickening or solid enhancement,
compatible with lymphangiomas.

Adrenals/Urinary Tract: Normal adrenals. No hydronephrosis. Tiny
simple parapelvic renal cysts, left greater than right. No
suspicious renal masses.

Stomach/Bowel: Normal non-distended stomach. Visualized small and
large bowel is normal caliber, with no bowel wall thickening. Marked
diffuse colonic diverticulosis.

Vascular/Lymphatic: Atherosclerotic nonaneurysmal abdominal aorta.
Patent portal, splenic, hepatic and renal veins. No pathologically
enlarged lymph nodes in the abdomen.

Other: No abdominal ascites or focal fluid collection.

Musculoskeletal: No aggressive appearing focal osseous lesions.
IMPRESSION: 1. Mild acute non-necrotizing pancreatitis in the pancreatic head.
2. Solitary 4 mm ampullary stone. Dilated (10 mm diameter) common
bile duct. Minimal central intrahepatic biliary ductal dilatation.
3. Cholelithiasis. Mild diffuse gallbladder wall thickening. No
significant pericholecystic fluid.
4. Mild-to-moderate splenomegaly.
5. Marked diffuse colonic diverticulosis.

## 2021-03-15 SURGERY — ENDOSCOPIC RETROGRADE CHOLANGIOPANCREATOGRAPHY (ERCP) WITH PROPOFOL
Anesthesia: General

## 2021-03-15 MED ORDER — VITAMIN B-12 1000 MCG PO TABS
1000.0000 ug | ORAL_TABLET | Freq: Every day | ORAL | Status: DC
  Administered 2021-03-16 – 2021-03-17 (×2): 1000 ug via ORAL
  Filled 2021-03-15 (×2): qty 1

## 2021-03-15 MED ORDER — LACTATED RINGERS IV SOLN: INTRAVENOUS | Status: DC

## 2021-03-15 MED ORDER — OXYCODONE HCL 5 MG PO TABS: 5.0000 mg | ORAL_TABLET | Freq: Once | ORAL | Status: DC | PRN

## 2021-03-15 MED ORDER — ACETAMINOPHEN 325 MG PO TABS
650.0000 mg | ORAL_TABLET | Freq: Four times a day (QID) | ORAL | Status: DC | PRN
  Administered 2021-03-15: 650 mg via ORAL
  Filled 2021-03-15: qty 2

## 2021-03-15 MED ORDER — HYDROMORPHONE HCL 1 MG/ML IJ SOLN: 1.0000 mg | INTRAMUSCULAR | Status: DC | PRN

## 2021-03-15 MED ORDER — FENTANYL CITRATE (PF) 100 MCG/2ML IJ SOLN
INTRAMUSCULAR | Status: AC
  Filled 2021-03-15: qty 2

## 2021-03-15 MED ORDER — POTASSIUM 99 MG PO TABS: 99.0000 mg | ORAL_TABLET | Freq: Every day | ORAL | Status: DC

## 2021-03-15 MED ORDER — SEMAGLUTIDE(0.25 OR 0.5MG/DOS) 2 MG/1.5ML ~~LOC~~ SOPN: 0.5000 mg | PEN_INJECTOR | SUBCUTANEOUS | Status: DC

## 2021-03-15 MED ORDER — FUROSEMIDE 20 MG PO TABS: 20.0000 mg | ORAL_TABLET | ORAL | Status: DC

## 2021-03-15 MED ORDER — SUCCINYLCHOLINE CHLORIDE 200 MG/10ML IV SOSY
PREFILLED_SYRINGE | INTRAVENOUS | Status: DC | PRN
  Administered 2021-03-15: 100 mg via INTRAVENOUS

## 2021-03-15 MED ORDER — GLUCAGON HCL RDNA (DIAGNOSTIC) 1 MG IJ SOLR
INTRAMUSCULAR | Status: AC
  Filled 2021-03-15: qty 1

## 2021-03-15 MED ORDER — LIDOCAINE 2% (20 MG/ML) 5 ML SYRINGE
INTRAMUSCULAR | Status: DC | PRN
  Administered 2021-03-15: 60 mg via INTRAVENOUS

## 2021-03-15 MED ORDER — CALCIUM CARBONATE ANTACID 500 MG PO CHEW
1.0000 | CHEWABLE_TABLET | Freq: Two times a day (BID) | ORAL | Status: DC | PRN
  Administered 2021-03-15: 200 mg via ORAL
  Filled 2021-03-15: qty 1

## 2021-03-15 MED ORDER — INDOMETHACIN 50 MG RE SUPP
RECTAL | Status: AC
  Filled 2021-03-15: qty 2

## 2021-03-15 MED ORDER — PROPOFOL 10 MG/ML IV BOLUS
INTRAVENOUS | Status: DC | PRN
  Administered 2021-03-15: 120 mg via INTRAVENOUS

## 2021-03-15 MED ORDER — PROMETHAZINE HCL 25 MG/ML IJ SOLN: 6.2500 mg | INTRAMUSCULAR | Status: DC | PRN

## 2021-03-15 MED ORDER — GADOBUTROL 1 MMOL/ML IV SOLN
10.0000 mL | Freq: Once | INTRAVENOUS | Status: AC | PRN
  Administered 2021-03-15: 10 mL via INTRAVENOUS

## 2021-03-15 MED ORDER — FENTANYL CITRATE (PF) 100 MCG/2ML IJ SOLN
INTRAMUSCULAR | Status: DC | PRN
  Administered 2021-03-15: 50 ug via INTRAVENOUS

## 2021-03-15 MED ORDER — CHLORTHALIDONE 25 MG PO TABS: 25.0000 mg | ORAL_TABLET | Freq: Every day | ORAL | Status: DC

## 2021-03-15 MED ORDER — SODIUM CHLORIDE 0.9 % IV SOLN
INTRAVENOUS | Status: DC | PRN
  Administered 2021-03-15: 20 mL

## 2021-03-15 MED ORDER — GEMFIBROZIL 600 MG PO TABS
600.0000 mg | ORAL_TABLET | Freq: Two times a day (BID) | ORAL | Status: DC
  Filled 2021-03-15: qty 1

## 2021-03-15 MED ORDER — ONDANSETRON HCL 4 MG/2ML IJ SOLN
INTRAMUSCULAR | Status: DC | PRN
  Administered 2021-03-15: 4 mg via INTRAVENOUS

## 2021-03-15 MED ORDER — PANTOPRAZOLE SODIUM 40 MG PO TBEC: 40.0000 mg | DELAYED_RELEASE_TABLET | Freq: Every day | ORAL | Status: DC

## 2021-03-15 MED ORDER — FENTANYL CITRATE (PF) 100 MCG/2ML IJ SOLN
50.0000 ug | INTRAMUSCULAR | Status: DC | PRN
  Filled 2021-03-15: qty 2

## 2021-03-15 MED ORDER — DEXAMETHASONE SODIUM PHOSPHATE 10 MG/ML IJ SOLN
INTRAMUSCULAR | Status: DC | PRN
  Administered 2021-03-15: 10 mg via INTRAVENOUS

## 2021-03-15 MED ORDER — EPHEDRINE SULFATE-NACL 50-0.9 MG/10ML-% IV SOSY
PREFILLED_SYRINGE | INTRAVENOUS | Status: DC | PRN
  Administered 2021-03-15: 20 mg via INTRAVENOUS

## 2021-03-15 MED ORDER — FLUTICASONE PROPIONATE 50 MCG/ACT NA SUSP
2.0000 | Freq: Every day | NASAL | Status: DC | PRN
  Filled 2021-03-15: qty 16

## 2021-03-15 MED ORDER — INSULIN PEN NEEDLE 32G X 6 MM MISC: Freq: Every day | Status: DC

## 2021-03-15 MED ORDER — OXYCODONE HCL 5 MG/5ML PO SOLN: 5.0000 mg | Freq: Once | ORAL | Status: DC | PRN

## 2021-03-15 MED ORDER — INDOMETHACIN 50 MG RE SUPP
RECTAL | Status: DC | PRN
  Administered 2021-03-15: 100 mg via RECTAL

## 2021-03-15 MED ORDER — ONDANSETRON HCL 4 MG PO TABS: 4.0000 mg | ORAL_TABLET | Freq: Four times a day (QID) | ORAL | Status: DC | PRN

## 2021-03-15 MED ORDER — MEPERIDINE HCL 50 MG/ML IJ SOLN: 6.2500 mg | INTRAMUSCULAR | Status: DC | PRN

## 2021-03-15 MED ORDER — INSULIN ASPART 100 UNIT/ML IJ SOLN
0.0000 [IU] | Freq: Four times a day (QID) | INTRAMUSCULAR | Status: DC | PRN
  Administered 2021-03-16: 8 [IU] via SUBCUTANEOUS
  Filled 2021-03-15: qty 0.15

## 2021-03-15 MED ORDER — SODIUM CHLORIDE 0.9 % IV SOLN
INTRAVENOUS | Status: DC | PRN
  Administered 2021-03-15 (×2): 250 mL via INTRAVENOUS

## 2021-03-15 MED ORDER — OXYBUTYNIN 3.9 MG/24HR TD PTTW: 1.0000 | MEDICATED_PATCH | TRANSDERMAL | Status: DC

## 2021-03-15 MED ORDER — MIDAZOLAM HCL 2 MG/2ML IJ SOLN
INTRAMUSCULAR | Status: AC
  Filled 2021-03-15: qty 2

## 2021-03-15 MED ORDER — ONDANSETRON HCL 4 MG/2ML IJ SOLN: 4.0000 mg | Freq: Four times a day (QID) | INTRAMUSCULAR | Status: DC | PRN

## 2021-03-15 MED ORDER — SUGAMMADEX SODIUM 500 MG/5ML IV SOLN
INTRAVENOUS | Status: DC | PRN
  Administered 2021-03-15: 300 mg via INTRAVENOUS

## 2021-03-15 MED ORDER — LACTATED RINGERS IV BOLUS
1000.0000 mL | Freq: Once | INTRAVENOUS | Status: AC
  Administered 2021-03-15: 1000 mL via INTRAVENOUS

## 2021-03-15 MED ORDER — PIPERACILLIN-TAZOBACTAM 3.375 G IVPB
3.3750 g | Freq: Three times a day (TID) | INTRAVENOUS | Status: DC
  Administered 2021-03-15 – 2021-03-16 (×4): 3.375 g via INTRAVENOUS
  Filled 2021-03-15 (×5): qty 50

## 2021-03-15 MED ORDER — PIPERACILLIN-TAZOBACTAM 3.375 G IVPB 30 MIN
3.3750 g | Freq: Once | INTRAVENOUS | Status: AC
  Administered 2021-03-15: 3.375 g via INTRAVENOUS
  Filled 2021-03-15: qty 50

## 2021-03-15 MED ORDER — ATENOLOL-CHLORTHALIDONE 50-25 MG PO TABS: 1.0000 | ORAL_TABLET | Freq: Every day | ORAL | Status: DC

## 2021-03-15 MED ORDER — MIDAZOLAM HCL 2 MG/2ML IJ SOLN
INTRAMUSCULAR | Status: DC | PRN
  Administered 2021-03-15: 1 mg via INTRAVENOUS

## 2021-03-15 MED ORDER — ATENOLOL 50 MG PO TABS: 50.0000 mg | ORAL_TABLET | Freq: Every day | ORAL | Status: DC

## 2021-03-15 MED ORDER — PHENYLEPHRINE 40 MCG/ML (10ML) SYRINGE FOR IV PUSH (FOR BLOOD PRESSURE SUPPORT)
PREFILLED_SYRINGE | INTRAVENOUS | Status: DC | PRN
  Administered 2021-03-15: 120 ug via INTRAVENOUS

## 2021-03-15 MED ORDER — FENTANYL CITRATE (PF) 100 MCG/2ML IJ SOLN
25.0000 ug | INTRAMUSCULAR | Status: DC | PRN
Start: 2021-03-15 — End: 2021-03-16

## 2021-03-15 MED ORDER — ESMOLOL HCL 100 MG/10ML IV SOLN
INTRAVENOUS | Status: DC | PRN
  Administered 2021-03-15: 40 mg via INTRAVENOUS

## 2021-03-15 MED ORDER — ROCURONIUM BROMIDE 10 MG/ML (PF) SYRINGE
PREFILLED_SYRINGE | INTRAVENOUS | Status: DC | PRN
  Administered 2021-03-15: 40 mg via INTRAVENOUS

## 2021-03-15 MED ORDER — ESCITALOPRAM OXALATE 10 MG PO TABS
10.0000 mg | ORAL_TABLET | Freq: Every day | ORAL | Status: DC
  Administered 2021-03-15 – 2021-03-17 (×3): 10 mg via ORAL
  Filled 2021-03-15 (×3): qty 1

## 2021-03-15 MED ORDER — SODIUM CHLORIDE 0.9 % IV SOLN: INTRAVENOUS | Status: DC

## 2021-03-15 MED ORDER — GABAPENTIN 300 MG PO CAPS
300.0000 mg | ORAL_CAPSULE | Freq: Two times a day (BID) | ORAL | Status: DC
  Administered 2021-03-15 – 2021-03-17 (×6): 300 mg via ORAL
  Filled 2021-03-15 (×6): qty 1

## 2021-03-15 MED ORDER — ACETAMINOPHEN 650 MG RE SUPP: 650.0000 mg | Freq: Four times a day (QID) | RECTAL | Status: DC | PRN

## 2021-03-15 MED ORDER — MIDAZOLAM HCL 2 MG/2ML IJ SOLN: 0.5000 mg | Freq: Once | INTRAMUSCULAR | Status: DC | PRN

## 2021-03-15 MED ORDER — VITAMIN D3 25 MCG (1000 UNIT) PO TABS
2000.0000 [IU] | ORAL_TABLET | Freq: Every day | ORAL | Status: DC
  Filled 2021-03-15: qty 2

## 2021-03-15 MED ORDER — PIPERACILLIN-TAZOBACTAM 3.375 G IVPB: 3.3750 g | Freq: Three times a day (TID) | INTRAVENOUS | Status: DC

## 2021-03-15 MED ORDER — PHENOL 1.4 % MT LIQD
1.0000 | OROMUCOSAL | Status: DC | PRN
  Filled 2021-03-15: qty 177

## 2021-03-15 MED ORDER — PANTOPRAZOLE SODIUM 40 MG PO TBEC
40.0000 mg | DELAYED_RELEASE_TABLET | Freq: Every day | ORAL | Status: DC
  Administered 2021-03-15 – 2021-03-17 (×3): 40 mg via ORAL
  Filled 2021-03-15 (×3): qty 1

## 2021-03-15 MED ORDER — ASCORBIC ACID 500 MG PO TABS: 500.0000 mg | ORAL_TABLET | Freq: Every day | ORAL | Status: DC

## 2021-03-15 NOTE — H&P (Signed)
History and Physical    Kathleen Wiley [id number] DOB: 1946-06-07 DOA: 03/14/2021  PCP: Kathleen Jordan, MD   Patient coming from:  Home  Chief Complaint: Abdominal pain and nausea.  HPI: Kathleen Wiley is a 75 y.o. female with medical history significant for DMT2, HTN, OA, obesity who presents with a 4-day history of abdominal pain that is in the upper center and the right upper quadrant of her abdomen.  Occasionally the pain will radiate around into her back.  She has had nausea with the abdominal pain but has not had any vomiting or diarrhea.  She states when she eats that intensifies and aggravates the pain.  She has not found any alleviating factors.  She has not had any fevers or chills.  She denies any cough, shortness of breath, chest pain or palpitations.  Not had any urinary frequency or dysuria.  She was seen by her PCP today and had lab work done and was called and told her LFTs were elevated.  Was having worsening pain and continued nausea she came to the hospital for evaluation.  She reports she is never had pain like this in the past.  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  She reports she has been having bowel movements regularly and they appear a little lighter in color but there is not been any blood or mucus.  She has not had any abdominal injury or trauma. Lives with her husband.  Denies tobacco, alcohol, illicit drug use  ED Course:  Kathleen Wiley has been hemodynamically stable in the emergency room.  CBC shows WBC of 3.9, hemoglobin 12.7, hematocrit 39.2 and platelets of 201.  Potassium is mildly decreased at 3.4 and other electrolytes are normal.  Creatinine is 0.76 with a BUN of 12.  Lipase is 3782.  AST is 115 ALT 112 and alkaline phosphatase 206.  Total bilirubin is 6.0.  Gallbladder ultrasound shows cholelithiasis with gallbladder sludge and acute cholecystitis.  Common bile duct is mildly dilated at 9 mm.  Hepatic steatosis  present.  ER physician discussed with gastroenterology recommended MRCP in the morning for further evaluation to make sure there is no choledocholithiasis or retained stone.  GI will consult and see patient in the morning.  Surgery will need to be consulted in the morning for evaluation once it is determined if patient will need ERCP if retained stone is present.  On Zosyn for antibiotic coverage empirically.  Pain control provided.  Hospitalist service is asked to admit for further management  Review of Systems:  General: Denies weakness, fever, chills, weight loss, night sweats.  Denies dizziness.  HENT: Denies head trauma, headache, denies change in hearing, tinnitus. Denies nasal bleeding. Denies sore throat, sores in mouth.  Denies difficulty swallowing Eyes: Denies blurry vision, pain in eye, drainage.  Denies discoloration of eyes. Neck: Denies pain.  Denies swelling.  Denies pain with movement. Cardiovascular: Denies chest pain, palpitations.  Denies edema.  Denies orthopnea Respiratory: Denies shortness of breath, cough.  Denies wheezing.  Denies sputum production Gastrointestinal: Reports RUQ and epigastric abdominal pain with bloating. Reports nausea but no vomiting, diarrhea. Denies melena. Denies hematemesis. Musculoskeletal: Denies limitation of movement.  Denies deformity or swelling.  Denies pain.  Genitourinary: Denies pelvic pain.  Denies urinary frequency or hesitancy.  Denies dysuria.  Skin: Denies rash.  Denies petechiae, purpura, ecchymosis. Neurological: Denies syncope. Denies seizure activity. Denies slurred speech, drooping face. Denies visual change. Psychiatric:  Denies depression, anxiety. Denies hallucinations.  Past Medical History:  Diagnosis Date  . Anxiety   . Arthritis    knee, hip  . Cellulitis 08/2016   left leg  . Complication of anesthesia   . Dysrhythmia    Afib due to Flagyl- 7 -2007- not problems since  . GERD (gastroesophageal reflux disease)   .  Heart murmur    "slight "  informed years ago.-   . Hernia, umbilical   . History of atrial fibrillation 2007   with flagyl  . History of endometriosis   . Hypercholesteremia   . Hypertension    has not been to a cardiologist  . PONV (postoperative nausea and vomiting)   . Pre-diabetes   . Rectal vaginal fistula   . Refusal of blood transfusions as patient is Jehovah's Witness     Past Surgical History:  Procedure Laterality Date  . APPENDECTOMY    . BUNIONECTOMY Right 01/09/2019   Procedure: RIGHT FOOT BUNION CORRECTION;  Surgeon: Erle Crocker, MD;  Location: [place];  Service: Orthopedics;  Laterality: Right;  . EXPLORATORY LAPAROTOMY  1973   for endometrosis  . HAMMERTOE RECONSTRUCTION WITH WEIL OSTEOTOMY Right 01/09/2019   Procedure: RIGHT FOOT 2-4 HAMMERTOE CORRECTION WITH WEIL OSTEOTOMY 2ND METATARSAL, DEEP TENDON TRANSFER WITH AKIN OSTEOTOMY;  Surgeon: Erle Crocker, MD;  Location: [place];  Service: Orthopedics;  Laterality: Right;  . I & D EXTREMITY Right 06/10/2016   Procedure: IRRIGATION AND DEBRIDEMENT THUMB FLEXOR SHEATH;  Surgeon: Leanora Cover, MD;  Location: [place];  Service: Orthopedics;  Laterality: Right;  . JOINT REPLACEMENT     Left Hip  . [place]  . rectovaginal fistula repair  1980  . TOTAL HIP ARTHROPLASTY  05/08/2012   Procedure: TOTAL HIP ARTHROPLASTY;  Surgeon: Kerin Salen, MD;  Location: [place];  Service: Orthopedics;  Laterality: Left;  . TOTAL HIP ARTHROPLASTY Right 09/18/2018   Procedure: TOTAL HIP ARTHROPLASTY ANTERIOR APPROACH;  Surgeon: Frederik Pear, MD;  Location: [facility];  Service: Orthopedics;  Laterality: Right;  . TOTAL KNEE ARTHROPLASTY Right 01/24/2013   Dr Mayer Camel  . TOTAL KNEE ARTHROPLASTY Right 01/24/2013   Procedure: RIGHT TOTAL KNEE ARTHROPLASTY;  Surgeon: Kerin Salen, MD;  Location: [place];  Service: Orthopedics;  Laterality: Right;    Social History  reports that she quit smoking about 39 years ago. She has a 10.00  pack-year smoking history. She has never used smokeless tobacco. She reports that she does not drink alcohol and does not use drugs.  Allergies  Allergen Reactions  . Other     Refuses whole blood but does accept albumin  . Metronidazole Other (See Comments)    Chest pain, A-fib   . Septra [Sulfamethoxazole-Trimethoprim] Other (See Comments)    Blisters     Family History  Problem Relation Age of Onset  . Breast cancer Neg Hx      Prior to Admission medications   Medication Sig Start Date End Date Taking? Authorizing Provider  ascorbic acid (VITAMIN C) 500 MG tablet Take 1 tablet (500 mg total) by mouth daily. 10/31/19   Caren Griffins, MD  atenolol-chlorthalidone (TENORETIC) 50-25 MG tablet Take 1 tablet by mouth daily. 09/23/19   [provider]  Cholecalciferol (VITAMIN D) 50 MCG (2000 UT) tablet Take 2,000 Units by mouth daily.     [provider]  cyanocobalamin 1000 MCG tablet Take 1,000 mcg by mouth daily.     [provider]  dexamethasone (DECADRON) 6 MG tablet Take 1 tablet (6 mg total) by mouth daily. 10/30/19   Caren Griffins, MD  diphenhydrAMINE (BENADRYL) 25 MG tablet Take 25 mg by mouth at bedtime.    [provider]  escitalopram (LEXAPRO) 10 MG tablet Take 10 mg by mouth daily.    [provider]  fluticasone (FLONASE) 50 MCG/ACT nasal spray Place 2 sprays into both nostrils daily as needed for allergies.  07/02/19   [provider]  furosemide (LASIX) 20 MG tablet Take 20 mg by mouth 3 (three) times a week.  09/23/19   [provider]  gabapentin (NEURONTIN) 300 MG capsule Take 300 mg by mouth at bedtime.  07/25/19   [provider]  gemfibrozil (LOPID) 600 MG tablet Take 600 mg by mouth 2 (two) times daily before a meal.    [provider]  guaiFENesin-dextromethorphan (ROBITUSSIN DM) 100-10 MG/5ML syrup Take 10 mLs by mouth every 4 (four) hours as needed for cough. 10/30/19    Caren Griffins, MD  Insulin Pen Needle 32G X 6 MM MISC Inject into the skin daily. 07/18/20   [provider]  naproxen sodium (ALEVE) 220 MG tablet 1 tablet with food or milk as needed    [provider]  omeprazole (PRILOSEC) 20 MG capsule Take 20 mg by mouth daily.    [provider]  ondansetron (ZOFRAN) 4 MG tablet Take 4 mg by mouth every 8 (eight) hours as needed. 03/14/21   [provider]  oxybutynin (OXYTROL FOR WOMEN) 3.9 MG/24HR 1 patch to skin    [provider]  OZEMPIC, 0.25 OR 0.5 MG/DOSE, 2 MG/1.5ML SOPN Inject 0.5 mg into the skin once a week.  09/20/19   [provider]  Potassium 99 MG TABS Take 99 mg by mouth daily.    [provider]  TRESIBA FLEXTOUCH 100 UNIT/ML FlexTouch Pen Inject into the skin. 07/18/20   [provider]  triamcinolone ointment (KENALOG) 0.1 % APPLY TO THE LEGS ONCE TO TWICE DAILY 12/31/20   [provider]  zinc sulfate 220 (50 Zn) MG capsule Take 1 capsule (220 mg total) by mouth daily. 10/30/19   Caren Griffins, MD    Physical Exam: Vitals:   03/15/21 0030 03/15/21 0100 03/15/21 0130 03/15/21 0200  BP: (!) 152/77 (!) 155/88 (!) 154/82 (!) 160/93  Pulse: 78 84 84 87  Resp: 18 20 18 20   Temp:      TempSrc:      SpO2: 93% 91% 92% 95%  Weight:      Height:        Constitutional: NAD, calm, comfortable Vitals:   03/15/21 0030 03/15/21 0100 03/15/21 0130 03/15/21 0200  BP: (!) 152/77 (!) 155/88 (!) 154/82 (!) 160/93  Pulse: 78 84 84 87  Resp: 18 20 18 20   Temp:      TempSrc:      SpO2: 93% 91% 92% 95%  Weight:      Height:       General: WDWN, Alert and oriented x3.  Eyes: EOMI, PERRL, conjunctivae normal. Sclera nonicteric HENT:  [place]/AT, external ears normal.  Nares patent without epistasis. Mucous membranes are moist. Posterior pharynx clear of any exudate or lesions. Neck: Soft, normal range of motion, supple, no masses, no thyromegaly.  Trachea  midline Respiratory: clear to auscultation bilaterally, no wheezing, no crackles. Normal respiratory effort. No accessory muscle use.  Cardiovascular: Regular rate and rhythm, no murmurs / rubs / gallops.  No extremity edema. 2+ pedal pulses.  Abdomen: Soft, RUQ and epigastric tenderness, nondistended, no rebound or guarding. Obese. Positive Murphy's sign.  No masses palpated. Bowel sounds normoactive. Soft umbilical hernia-reducable. Musculoskeletal: FROM. no cyanosis. Normal muscle tone.  Skin: Warm, dry, intact no rashes, lesions, ulcers. No induration Neurologic: CN 2-12 grossly intact. Normal speech. Sensation intact in upper extremities. Mild decrease in sensation of legs and feet that is chronic. Strength 5/5 in all extremities.   Psychiatric: Normal judgment and insight.  Normal mood.    Labs on Admission: I have personally reviewed following labs and imaging studies  CBC: Recent Labs  Lab 03/14/21 2101  WBC 3.9*  HGB 12.7  HCT 39.2  MCV 99.7  PLT 812    Basic Metabolic Panel: Recent Labs  Lab 03/14/21 2101  NA 136  K 3.4*  CL 98  CO2 26  GLUCOSE 179*  BUN 12  CREATININE 0.76  CALCIUM 9.0    GFR: Estimated Creatinine Clearance: 78.2 mL/min (by C-G formula based on SCr of 0.76 mg/dL).  Liver Function Tests: Recent Labs  Lab 03/14/21 2101  AST 115*  ALT 112*  ALKPHOS 206*  BILITOT 6.0*  PROT 7.0  ALBUMIN 3.3*    Urine analysis:    Component Value Date/Time   COLORURINE YELLOW 09/13/2018 [place] 09/13/2018 1244   LABSPEC 1.010 09/13/2018 1244   PHURINE 6.0 09/13/2018 [place] 09/13/2018 [place] NEGATIVE 09/13/2018 [place] NEGATIVE 09/13/2018 [place] 09/13/2018 1244   PROTEINUR NEGATIVE 09/13/2018 1244   UROBILINOGEN 1.0 08/06/2012 2046   NITRITE NEGATIVE 09/13/2018 1244   LEUKOCYTESUR TRACE (A) 09/13/2018 1244    Radiological Exams on Admission: US Abdomen Limited  Result  Date: 03/15/2021 CLINICAL DATA:  Right upper quadrant pain. EXAM: ULTRASOUND ABDOMEN LIMITED RIGHT UPPER QUADRANT COMPARISON:  Ultrasound abdomen 10/26/2019 FINDINGS: Gallbladder: Calcified gallstones and gallbladder sludge within the gallbladder lumen. Thickened gallbladder wall with associated pericholecystic fluid. A positive sonographic Murphy sign noted by sonographer. Common bile duct: Diameter: 9 mm. Liver: No focal lesion identified. Increased parenchymal echogenicity. Portal vein is patent on color Doppler imaging with normal direction of blood flow towards the liver. Other: None. IMPRESSION: 1. Cholelithiasis and gallbladder sludge with associated acute cholecystitis. 2. Choledocholithiasis not excluded in the setting of borderline enlarged common bile duct (9 mm). 3. Hepatic steatosis. Please note limited evaluation for focal hepatic masses in a patient with hepatic steatosis due to decreased penetration of the acoustic ultrasound waves. Electronically Signed   By: Iven Finn M.D.   On: 03/15/2021 01:14    Assessment/Plan Principal Problem:   Gallstone pancreatitis Ms. Hush is admitted to Med/Surg floor.  Lipase level is over 3000. IV fluid hydration with LR at 125 ml/hr overnight. Dilaudid provided for pain control. Zofran for nausea provided MRCP in the morning.  If positive for retained stone patient may need an ERCP with GI. GI has been consulted and will evaluate patient in the morning.  Active Problems:   Acute cholecystitis Ms. Eichholz has acute cholecystitis with gallbladder wall thickening and sludge with a mildly dilated common bile duct on ultrasound.  She has positive Murphy sign. Will need surgical consultation after GI evaluates and determine if ERCP is required Patient states she would like gallbladder removed during this hospitalization if possible    Essential hypertension Continue home medications of Tenoretic.  Monitor blood pressure.    Controlled type 2  diabetes mellitus  with neuropathy Continue home medication of Ozempic.  Sugars were monitored every 6 hours while n.p.o. and sliding scale insulin provided as needed for glycemic control.  Check hemoglobin A1c    Hypokalemia Mild hypokalemia with a potassium of 3.4.  We will supplement potassium    Obesity with body mass index (BMI) of 30.0 to 39.9 Continue Ozempic which is indicated for weight loss as well as diabetes management.  PCP for further dietary lifestyle interventions to assist with weight loss    DVT prophylaxis: TED hose and early ambulation for DVT prophylaxis.  Code Status:   Full Code Family Communication:  Diagnosis and plan is discussed with patient.  She verbalized understanding agrees with plan.  Questions answered.  Further recommendations to follow as clinically indicated Disposition Plan:   Patient is from:  Home  Anticipated DC to:  Home  Anticipated DC date:  Anticipate more than 2 midnight stay in hospital  Anticipated DC barriers: No barriers to discharge identified at this time  Consults called:  GI-Dr. Roena Malady of [facility], was consulted by ER physician Admission status:  Inpatient   Yevonne Aline Jaeleigh Monaco MD [facility]  How to contact the [facility] Attending or Consulting provider [place] or covering provider during after hours [place], for this patient?   1. Check the care team in [facility] and look for a) attending/consulting [facility] provider listed and b) the [facility] team listed 2. Log into [email or web address] and use [place]'s universal password to access. If you do not have the password, please contact the hospital operator. 3. Locate the [facility] provider you are looking for under [facility] and page to a number that you can be directly reached. 4. If you still have difficulty reaching the provider, please page the [facility] (Director on Call) for the Hospitalists listed on amion for assistance.  03/15/2021, 2:31 AM

## 2021-03-15 NOTE — Consult Note (Signed)
Reason for Consult gallstone pancreatitis and choledocholithiasis Referring Physician: Dr. Posey Pronto MD  Kathleen Wiley is an 75 y.o. female.  HPI: Pleasant 75 year old female asked to see at the request of Dr. Posey Pronto for gallstone pancreatitis and choledocholithiasis.  She presented 2 days ago with severe epigastric pain, nausea and vomiting.  Work-up revealed gallstones with a lipase of over 3000.  She also had a bilirubin of 6 and was visibly jaundiced.  She underwent successful ERCP today with clearance of her common bile duct.  She has no epigastric pain currently or back pain.  She is on a diet without difficulty.  History of umbilical hernia as well as previous laparotomy secondary to endometriosis via Pfannenstiel incision.  Past Medical History:  Diagnosis Date  . Anxiety   . Arthritis    knee, hip  . Cellulitis 08/2016   left leg  . Complication of anesthesia   . Diabetes mellitus without complication ([place])   . Dysrhythmia    Afib due to Flagyl- 7 -2007- not problems since  . GERD (gastroesophageal reflux disease)   . Heart murmur    "slight "  informed years ago.-   . Hernia, umbilical   . History of atrial fibrillation 2007   with flagyl  . History of endometriosis   . Hypercholesteremia   . Hypertension    has not been to a cardiologist  . PONV (postoperative nausea and vomiting)   . Pre-diabetes   . Rectal vaginal fistula   . Refusal of blood transfusions as patient is Jehovah's Witness   . Sleep apnea     Past Surgical History:  Procedure Laterality Date  . APPENDECTOMY    . BUNIONECTOMY Right 01/09/2019   Procedure: RIGHT FOOT BUNION CORRECTION;  Surgeon: Erle Crocker, MD;  Location: [facility];  Service: Orthopedics;  Laterality: Right;  . EXPLORATORY LAPAROTOMY  1973   for endometrosis  . HAMMERTOE RECONSTRUCTION WITH WEIL OSTEOTOMY Right 01/09/2019   Procedure: RIGHT FOOT 2-4 HAMMERTOE CORRECTION WITH WEIL OSTEOTOMY 2ND METATARSAL, DEEP TENDON TRANSFER WITH  AKIN OSTEOTOMY;  Surgeon: Erle Crocker, MD;  Location: [facility];  Service: Orthopedics;  Laterality: Right;  . I & D EXTREMITY Right 06/10/2016   Procedure: IRRIGATION AND DEBRIDEMENT THUMB FLEXOR SHEATH;  Surgeon: Leanora Cover, MD;  Location: [facility];  Service: Orthopedics;  Laterality: Right;  . JOINT REPLACEMENT     Left Hip  . [place]  . rectovaginal fistula repair  1980  . TOTAL HIP ARTHROPLASTY  05/08/2012   Procedure: TOTAL HIP ARTHROPLASTY;  Surgeon: Kerin Salen, MD;  Location: [facility];  Service: Orthopedics;  Laterality: Left;  . TOTAL HIP ARTHROPLASTY Right 09/18/2018   Procedure: TOTAL HIP ARTHROPLASTY ANTERIOR APPROACH;  Surgeon: Frederik Pear, MD;  Location: [facility];  Service: Orthopedics;  Laterality: Right;  . TOTAL KNEE ARTHROPLASTY Right 01/24/2013   Dr Mayer Camel  . TOTAL KNEE ARTHROPLASTY Right 01/24/2013   Procedure: RIGHT TOTAL KNEE ARTHROPLASTY;  Surgeon: Kerin Salen, MD;  Location: [facility];  Service: Orthopedics;  Laterality: Right;    Family History  Problem Relation Age of Onset  . Breast cancer Neg Hx     Social History:  reports that she quit smoking about 39 years ago. She has a 10.00 pack-year smoking history. She has never used smokeless tobacco. She reports that she does not drink alcohol and does not use drugs.  Allergies:  Allergies  Allergen Reactions  . Other     Refuses whole  blood but does accept albumin  . Metronidazole Other (See Comments)    Chest pain, A-fib   . Septra [Sulfamethoxazole-Trimethoprim] Other (See Comments)    Blisters     Medications: I have reviewed the patient's current medications.  Results for orders placed or performed during the hospital encounter of 03/14/21 (from the past 48 hour(s))  Lipase, blood     Status: Abnormal   Collection Time: 03/14/21  9:01 PM  Result Value Ref Range   Lipase 3,782 (H) 11 - 51 U/L    Comment: RESULTS CONFIRMED BY MANUAL DILUTION Performed at [facility] [address] [place], [place] [address]   Comprehensive metabolic panel     Status: Abnormal   Collection Time: 03/14/21  9:01 PM  Result Value Ref Range   Sodium 136 135 - 145 mmol/L   Potassium 3.4 (L) 3.5 - 5.1 mmol/L   Chloride 98 98 - 111 mmol/L   CO2 26 22 - 32 mmol/L   Glucose, Bld 179 (H) 70 - 99 mg/dL    Comment: Glucose reference range applies only to samples taken after fasting for at least 8 hours.   BUN 12 8 - 23 mg/dL   Creatinine, Ser 0.76 0.44 - 1.00 mg/dL   Calcium 9.0 8.9 - 10.3 mg/dL   Total Protein 7.0 6.5 - 8.1 g/dL   Albumin 3.3 (L) 3.5 - 5.0 g/dL   AST 115 (H) 15 - 41 U/L   ALT 112 (H) 0 - 44 U/L   Alkaline Phosphatase 206 (H) 38 - 126 U/L   Total Bilirubin 6.0 (H) 0.3 - 1.2 mg/dL   GFR, Estimated >60 >60 mL/min    Comment: (NOTE) Calculated using the CKD-EPI Creatinine Equation (2021)    Anion gap 12 5 - 15    Comment: Performed at [facility], [place] [address] [place], [place] [address]  CBC     Status: Abnormal   Collection Time: 03/14/21  9:01 PM  Result Value Ref Range   WBC 3.9 (L) 4.0 - 10.5 K/uL   RBC 3.93 3.87 - 5.11 MIL/uL   Hemoglobin 12.7 12.0 - 15.0 g/dL   HCT 39.2 36.0 - 46.0 %   MCV 99.7 80.0 - 100.0 fL   MCH 32.3 26.0 - 34.0 pg   MCHC 32.4 30.0 - 36.0 g/dL   RDW 14.3 11.5 - 15.5 %   Platelets 201 150 - 400 K/uL   nRBC 0.0 0.0 - 0.2 %    Comment: Performed at [facility], [place] [address] [place], [place] [address]  Urinalysis, Routine w reflex microscopic     Status: Abnormal   Collection Time: 03/14/21  9:01 PM  Result Value Ref Range   Color, Urine [place] (A) YELLOW    Comment: BIOCHEMICALS MAY BE AFFECTED BY COLOR   APPearance CLEAR CLEAR   Specific Gravity, Urine 1.012 1.005 - 1.030   pH 7.0 5.0 - 8.0   Glucose, UA NEGATIVE NEGATIVE mg/dL   Hgb urine dipstick NEGATIVE NEGATIVE   Bilirubin Urine SMALL (A) NEGATIVE   Ketones, ur NEGATIVE NEGATIVE mg/dL   Protein, ur NEGATIVE NEGATIVE  mg/dL   Nitrite NEGATIVE NEGATIVE   Leukocytes,Ua TRACE (A) NEGATIVE   RBC / HPF 0-5 0 - 5 RBC/hpf   WBC, UA 11-20 0 - 5 WBC/hpf   Bacteria, UA MANY (A) NONE SEEN   Squamous Epithelial / LPF 0-5 0 - 5   Mucus PRESENT     Comment:  Performed at [facility], [place] [address], [place], [place] [address]  Resp Panel by RT-PCR (Flu A&B, Covid) Nasopharyngeal Swab     Status: None   Collection Time: 03/15/21  1:51 AM   Specimen: Nasopharyngeal Swab; Nasopharyngeal(NP) swabs in vial transport medium  Result Value Ref Range   SARS Coronavirus 2 by RT PCR NEGATIVE NEGATIVE    Comment: (NOTE) SARS-CoV-2 target nucleic acids are NOT DETECTED.  The SARS-CoV-2 RNA is generally detectable in upper respiratory specimens during the acute phase of infection. The lowest concentration of SARS-CoV-2 viral copies this assay can detect is 138 copies/mL. A negative result does not preclude SARS-Cov-2 infection and should not be used as the sole basis for treatment or other patient management decisions. A negative result may occur with  improper specimen collection/handling, submission of specimen other than nasopharyngeal swab, presence of viral mutation(s) within the areas targeted by this assay, and inadequate number of viral copies(<138 copies/mL). A negative result must be combined with clinical observations, patient history, and epidemiological information. The expected result is Negative.  Fact Sheet for Patients:  EntrepreneurPulse.com.au  Fact Sheet for Healthcare Providers:  IncredibleEmployment.be  This test is no t yet approved or cleared by the [place] FDA and  has been authorized for detection and/or diagnosis of SARS-CoV-2 by FDA under an Emergency Use Authorization (EUA). This EUA will remain  in effect (meaning this test can be used) for the duration of the COVID-19 declaration under Section 564(b)(1) of the Act,  21 U.S.C.section 360bbb-3(b)(1), unless the authorization is terminated  or revoked sooner.       Influenza A by PCR NEGATIVE NEGATIVE   Influenza B by PCR NEGATIVE NEGATIVE    Comment: (NOTE) The Xpert Xpress SARS-CoV-2/FLU/RSV plus assay is intended as an aid in the diagnosis of influenza from Nasopharyngeal swab specimens and should not be used as a sole basis for treatment. Nasal washings and aspirates are unacceptable for Xpert Xpress SARS-CoV-2/FLU/RSV testing.  Fact Sheet for Patients: EntrepreneurPulse.com.au  Fact Sheet for Healthcare Providers: IncredibleEmployment.be  This test is not yet approved or cleared by the [place] FDA and has been authorized for detection and/or diagnosis of SARS-CoV-2 by FDA under an Emergency Use Authorization (EUA). This EUA will remain in effect (meaning this test can be used) for the duration of the COVID-19 declaration under Section 564(b)(1) of the Act, 21 U.S.C. section 360bbb-3(b)(1), unless the authorization is terminated or revoked.  Performed at [facility], [place] [address], [place], [place] [address]   Glucose, capillary     Status: Abnormal   Collection Time: 03/15/21  6:46 AM  Result Value Ref Range   Glucose-Capillary 174 (H) 70 - 99 mg/dL    Comment: Glucose reference range applies only to samples taken after fasting for at least 8 hours.  Protime-INR     Status: None   Collection Time: 03/15/21  7:34 AM  Result Value Ref Range   Prothrombin Time 13.8 11.4 - 15.2 seconds   INR 1.1 0.8 - 1.2    Comment: (NOTE) INR goal varies based on device and disease states. Performed at [facility], [place] [address], [place], [place] [address]   Comprehensive metabolic panel     Status: Abnormal   Collection Time: 03/15/21  7:34 AM  Result Value Ref Range   Sodium 137 135 - 145 mmol/L   Potassium 3.2 (L) 3.5 - 5.1 mmol/L   Chloride 103 98 - 111  mmol/L  CO2 25 22 - 32 mmol/L   Glucose, Bld 168 (H) 70 - 99 mg/dL    Comment: Glucose reference range applies only to samples taken after fasting for at least 8 hours.   BUN 10 8 - 23 mg/dL   Creatinine, Ser 0.72 0.44 - 1.00 mg/dL   Calcium 8.6 (L) 8.9 - 10.3 mg/dL   Total Protein 5.9 (L) 6.5 - 8.1 g/dL   Albumin 2.8 (L) 3.5 - 5.0 g/dL   AST 123 (H) 15 - 41 U/L   ALT 111 (H) 0 - 44 U/L   Alkaline Phosphatase 208 (H) 38 - 126 U/L   Total Bilirubin 7.0 (H) 0.3 - 1.2 mg/dL   GFR, Estimated >60 >60 mL/min    Comment: (NOTE) Calculated using the CKD-EPI Creatinine Equation (2021)    Anion gap 9 5 - 15    Comment: Performed at [facility], [place] [address], [place], [place] [address]  CBC     Status: Abnormal   Collection Time: 03/15/21  7:34 AM  Result Value Ref Range   WBC 2.7 (L) 4.0 - 10.5 K/uL   RBC 3.48 (L) 3.87 - 5.11 MIL/uL   Hemoglobin 11.4 (L) 12.0 - 15.0 g/dL   HCT 34.7 (L) 36.0 - 46.0 %   MCV 99.7 80.0 - 100.0 fL   MCH 32.8 26.0 - 34.0 pg   MCHC 32.9 30.0 - 36.0 g/dL   RDW 14.5 11.5 - 15.5 %   Platelets 157 150 - 400 K/uL   nRBC 0.0 0.0 - 0.2 %    Comment: Performed at [facility], [place] [address], [place], [place] [address]  Hemoglobin A1c     Status: Abnormal   Collection Time: 03/15/21  7:34 AM  Result Value Ref Range   Hgb A1c MFr Bld 6.4 (H) 4.8 - 5.6 %    Comment: (NOTE) Pre diabetes:          5.7%-6.4%  Diabetes:              >6.4%  Glycemic control for   <7.0% adults with diabetes    Mean Plasma Glucose 136.98 mg/dL    Comment: Performed at [facility] [address], [place], [place] [address]  Glucose, capillary     Status: Abnormal   Collection Time: 03/15/21 11:46 AM  Result Value Ref Range   Glucose-Capillary 143 (H) 70 - 99 mg/dL    Comment: Glucose reference range applies only to samples taken after fasting for at least 8 hours.  Glucose, capillary     Status: Abnormal   Collection Time: 03/15/21   5:31 PM  Result Value Ref Range   Glucose-Capillary 146 (H) 70 - 99 mg/dL    Comment: Glucose reference range applies only to samples taken after fasting for at least 8 hours.    US Abdomen Limited  Result Date: 03/15/2021 CLINICAL DATA:  Right upper quadrant pain. EXAM: ULTRASOUND ABDOMEN LIMITED RIGHT UPPER QUADRANT COMPARISON:  Ultrasound abdomen 10/26/2019 FINDINGS: Gallbladder: Calcified gallstones and gallbladder sludge within the gallbladder lumen. Thickened gallbladder wall with associated pericholecystic fluid. A positive sonographic Murphy sign noted by sonographer. Common bile duct: Diameter: 9 mm. Liver: No focal lesion identified. Increased parenchymal echogenicity. Portal vein is patent on color Doppler imaging with normal direction of blood flow towards the liver. Other: None. IMPRESSION: 1. Cholelithiasis and gallbladder sludge with associated acute cholecystitis. 2. Choledocholithiasis not excluded in the setting of borderline enlarged common bile duct (9 mm).  3. Hepatic steatosis. Please note limited evaluation for focal hepatic masses in a patient with hepatic steatosis due to decreased penetration of the acoustic ultrasound waves. Electronically Signed   By: Tish Frederickson M.D.   On: 03/15/2021 01:14   MR ABDOMEN MRCP W WO CONTAST  Result Date: 03/15/2021 CLINICAL DATA:  Inpatient.  Severe acute gallstone pancreatitis. EXAM: MRI ABDOMEN WITHOUT AND WITH CONTRAST (INCLUDING MRCP) TECHNIQUE: Multiplanar multisequence MR imaging of the abdomen was performed both before and after the administration of intravenous contrast. Heavily T2-weighted images of the biliary and pancreatic ducts were obtained, and three-dimensional MRCP images were rendered by post processing. CONTRAST:  87mL GADAVIST GADOBUTROL 1 MMOL/ML IV SOLN COMPARISON:  03/15/2021 abdominal sonogram. FINDINGS: Lower chest: No acute abnormality at the lung bases. Mild cardiomegaly. Hepatobiliary: Normal liver size and  configuration. No significant hepatic steatosis. No liver mass. Several layering gallstones in the mildly distended gallbladder, largest 9 mm. Mild diffuse gallbladder wall thickening. No significant pericholecystic fluid. Minimal central intrahepatic biliary ductal dilatation. Dilated common bile duct with diameter 10 mm. There is a 4 mm stone at the ampulla (series 3/image 36). Otherwise no stones in the bile ducts. No enhancing biliary masses, biliary strictures or beading of the bile ducts. Pancreas: Very mild peripancreatic edema in the pancreatic head with preserved pancreatic parenchymal enhancement, compatible with acute non necrotizing pancreatitis. No pancreatic mass or duct dilation. No pancreas divisum. Spleen: Mild-to-moderate splenomegaly (craniocaudal splenic length 15.1 cm. Two small cystic splenic lesions, largest 1.3 cm with thin internal septations and no wall thickening or solid enhancement, compatible with lymphangiomas. Adrenals/Urinary Tract: Normal adrenals. No hydronephrosis. Tiny simple parapelvic renal cysts, left greater than right. No suspicious renal masses. Stomach/Bowel: Normal non-distended stomach. Visualized small and large bowel is normal caliber, with no bowel wall thickening. Marked diffuse colonic diverticulosis. Vascular/Lymphatic: Atherosclerotic nonaneurysmal abdominal aorta. Patent portal, splenic, hepatic and renal veins. No pathologically enlarged lymph nodes in the abdomen. Other: No abdominal ascites or focal fluid collection. Musculoskeletal: No aggressive appearing focal osseous lesions. IMPRESSION: 1. Mild acute non-necrotizing pancreatitis in the pancreatic head. 2. Solitary 4 mm ampullary stone. Dilated (10 mm diameter) common bile duct. Minimal central intrahepatic biliary ductal dilatation. 3. Cholelithiasis. Mild diffuse gallbladder wall thickening. No significant pericholecystic fluid. 4. Mild-to-moderate splenomegaly. 5. Marked diffuse colonic diverticulosis.  Electronically Signed   By: Delbert Phenix M.D.   On: 03/15/2021 10:56    Review of Systems  Constitutional: Negative.   HENT: Negative.   Eyes: Negative.   Respiratory: Negative.   Cardiovascular: Negative.   Gastrointestinal: Positive for abdominal pain.  Endocrine: Negative.   Genitourinary: Negative.   Musculoskeletal: Positive for arthralgias.  Neurological: Negative.   Hematological: Negative.   Psychiatric/Behavioral: Negative.    Blood pressure 136/72, pulse 69, temperature 98 F (36.7 C), temperature source Oral, resp. rate 16, height 5\' 5"  (1.651 m), weight 108.9 kg, SpO2 95 %. Physical Exam Constitutional:      General: She is not in acute distress.    Appearance: She is well-developed.  HENT:     Head: Normocephalic and atraumatic.  Eyes:     General: Scleral icterus present.     Extraocular Movements: Extraocular movements intact.  Cardiovascular:     Rate and Rhythm: Normal rate.     Heart sounds: Normal heart sounds.  Pulmonary:     Effort: Pulmonary effort is normal.     Breath sounds: Normal breath sounds.  Abdominal:     Palpations: Abdomen is soft.  Tenderness: There is no abdominal tenderness.     Hernia: A hernia is present. Hernia is present in the umbilical area.  Skin:    General: Skin is warm.     Coloration: Skin is jaundiced.  Neurological:     General: No focal deficit present.     Mental Status: She is alert and oriented to person, place, and time.  Psychiatric:        Mood and Affect: Mood normal.        Behavior: Behavior normal.     Assessment/Plan: Gallstone pancreatitis with choledocholithiasis status post ERCP-looks well post procedure.  Recheck labs in a.m. and reexamined to ensure she does not have exacerbation of her pancreatitis from the ERCP.  Plan on laparoscopic cholecystectomy in the next 24 to 48 hours depending on her laboratory work, clinical condition and availability of OR time.  Otherwise n.p.o. after midnight for  tonight in case surgery can be performed tomorrow.  Will reassess condition in a.m. to determine timing of surgery.  Dr. Barry Dienes to see in AM.  All questions were answered.  Joyice Faster Zairah Arista MD 03/15/2021, 8:05 PM

## 2021-03-15 NOTE — Progress Notes (Signed)
Patient was given Fentanyl in the ED that caused a decrease in blood pressure. Patient was given a fluid bolus, hence the delay in her arrival on the unit.

## 2021-03-15 NOTE — Progress Notes (Signed)
[facility] PLAN OF CARE NOTE Patient: Kathleen Wiley EFE:[id number]   PCP: Jonathon Jordan, MD DOB: Jun 10, 1946   DOA: 03/14/2021   DOS: 03/15/2021    Patient was admitted by my colleague earlier on 03/15/2021. I have reviewed the H&P as well as assessment and plan and agree with the same. Important changes in the plan are listed below.  Plan of care: Principal Problem:   Gallstone pancreatitis Active Problems:   Essential hypertension, benign   Hypokalemia   Acute cholecystitis   Controlled type 2 diabetes mellitus with neuropathy (HCC)   Obesity with body mass index (BMI) of 30.0 to 39.9   Acute gallstone pancreatitis MRCP positive for CBD stone. Undergoing ERCP on 5/8. We will consult general surgery as well.  Author: Berle Mull, MD [facility] 03/15/2021 2:03 PM   If 7PM-7AM, please contact night-coverage at [email or web address]

## 2021-03-15 NOTE — Plan of Care (Signed)
  Problem: Education: Goal: Knowledge of General Education information will improve Description Including pain rating scale, medication(s)/side effects and non-pharmacologic comfort measures Outcome: Progressing   

## 2021-03-15 NOTE — Consult Note (Signed)
[facility] Consult  Referring Provider: ER/[facility] hospitalist Primary Care Physician:  Jonathon Jordan, MD Primary Gastroenterologist: Dr. Edwards/[facility]  Reason for Consultation: Gallstone pancreatitis, ampullary stone noted on MRCP  HPI: Kathleen Wiley is a 75 y.o. female was in her usual state of health until 4 days ago when she developed epigastric and right upper quadrant abdominal pain associated with nausea.  As this did not get any better she decided to come to the ER where she was found to have abnormal labs, T bili/AST/ALT/ALP of 6/115/112/206 with a lipase of 3782 compatible with biliary pancreatitis. Ultrasound showed cholelithiasis, gallbladder sludge, acute cholecystitis, enlarged CBD of 9 mm and hepatic steatosis. Subsequent MRCP showed mild acute nonnecrotizing pancreatitis, solitary 4 mm ampullary stone dilated CBD of 10 mm, mild central intrahepatic dilatation, cholelithiasis, diffuse mild gallbladder wall thickening.   Patient has family history of colon cancer in her father at age 47 and a sister at age 61, last colonoscopy was in 2013, she was due for screening in 2018 and has not kept up with her colonoscopies as recommended. She has some difficulty swallowing and complains of intermittent esophageal spasms. Otherwise denies change in bowel habits, noticing blood in stool, black stools. She takes omeprazole for acid reflux.  Past Medical History:  Diagnosis Date  . Anxiety   . Arthritis    knee, hip  . Cellulitis 08/2016   left leg  . Complication of anesthesia   . Diabetes mellitus without complication ([place])   . Dysrhythmia    Afib due to Flagyl- 7 -2007- not problems since  . GERD (gastroesophageal reflux disease)   . Heart murmur    "slight "  informed years ago.-   . Hernia, umbilical   . History of atrial fibrillation 2007   with flagyl  . History of endometriosis   . Hypercholesteremia   . Hypertension    has not been to a cardiologist   . PONV (postoperative nausea and vomiting)   . Pre-diabetes   . Rectal vaginal fistula   . Refusal of blood transfusions as patient is Jehovah's Witness   . Sleep apnea     Past Surgical History:  Procedure Laterality Date  . APPENDECTOMY    . BUNIONECTOMY Right 01/09/2019   Procedure: RIGHT FOOT BUNION CORRECTION;  Surgeon: Erle Crocker, MD;  Location: [place];  Service: Orthopedics;  Laterality: Right;  . EXPLORATORY LAPAROTOMY  1973   for endometrosis  . HAMMERTOE RECONSTRUCTION WITH WEIL OSTEOTOMY Right 01/09/2019   Procedure: RIGHT FOOT 2-4 HAMMERTOE CORRECTION WITH WEIL OSTEOTOMY 2ND METATARSAL, DEEP TENDON TRANSFER WITH AKIN OSTEOTOMY;  Surgeon: Erle Crocker, MD;  Location: [place];  Service: Orthopedics;  Laterality: Right;  . I & D EXTREMITY Right 06/10/2016   Procedure: IRRIGATION AND DEBRIDEMENT THUMB FLEXOR SHEATH;  Surgeon: Leanora Cover, MD;  Location: [place];  Service: Orthopedics;  Laterality: Right;  . JOINT REPLACEMENT     Left Hip  . Lutz  . rectovaginal fistula repair  1980  . TOTAL HIP ARTHROPLASTY  05/08/2012   Procedure: TOTAL HIP ARTHROPLASTY;  Surgeon: Kerin Salen, MD;  Location: [place];  Service: Orthopedics;  Laterality: Left;  . TOTAL HIP ARTHROPLASTY Right 09/18/2018   Procedure: TOTAL HIP ARTHROPLASTY ANTERIOR APPROACH;  Surgeon: Frederik Pear, MD;  Location: [facility];  Service: Orthopedics;  Laterality: Right;  . TOTAL KNEE ARTHROPLASTY Right 01/24/2013   Dr Mayer Camel  . TOTAL KNEE ARTHROPLASTY Right 01/24/2013   Procedure: RIGHT TOTAL  KNEE ARTHROPLASTY;  Surgeon: Kerin Salen, MD;  Location: [facility];  Service: Orthopedics;  Laterality: Right;    Prior to Admission medications   Medication Sig Start Date End Date Taking? Authorizing Provider  atenolol-chlorthalidone (TENORETIC) 50-25 MG tablet Take 1 tablet by mouth daily. 09/23/19  Yes [provider]  Cholecalciferol (VITAMIN D) 50 MCG (2000 UT) tablet Take 2,000 Units by mouth  daily.    Yes [provider]  cyanocobalamin 1000 MCG tablet Take 1,000 mcg by mouth daily.    Yes [provider]  escitalopram (LEXAPRO) 10 MG tablet Take 10 mg by mouth daily.   Yes [provider]  fluticasone (FLONASE) 50 MCG/ACT nasal spray Place 2 sprays into both nostrils daily as needed for allergies.  07/02/19  Yes [provider]  furosemide (LASIX) 20 MG tablet Take 20 mg by mouth 3 (three) times a week.  09/23/19  Yes [provider]  gabapentin (NEURONTIN) 300 MG capsule Take 300 mg by mouth 2 (two) times daily. 07/25/19  Yes [provider]  gemfibrozil (LOPID) 600 MG tablet Take 600 mg by mouth 2 (two) times daily before a meal.   Yes [provider]  naproxen sodium (ALEVE) 220 MG tablet 1 tablet with food or milk as needed   Yes [provider]  omeprazole (PRILOSEC) 20 MG capsule Take 20 mg by mouth daily.   Yes [provider]  oxybutynin (OXYTROL FOR WOMEN) 3.9 MG/24HR Place 1 patch onto the skin every 3 (three) days.   Yes [provider]  OZEMPIC, 0.25 OR 0.5 MG/DOSE, 2 MG/1.5ML SOPN Inject 0.5 mg into the skin once a week.  09/20/19  Yes [provider]  Potassium 99 MG TABS Take 99 mg by mouth daily.   Yes [provider]  ascorbic acid (VITAMIN C) 500 MG tablet Take 1 tablet (500 mg total) by mouth daily. Patient not taking: Reported on 03/15/2021 10/31/19   Caren Griffins, MD  dexamethasone (DECADRON) 6 MG tablet Take 1 tablet (6 mg total) by mouth daily. Patient not taking: No sig reported 10/30/19   Caren Griffins, MD  Insulin Pen Needle 32G X 6 MM MISC Inject into the skin daily. 07/18/20   [provider]  ondansetron (ZOFRAN) 4 MG tablet Take 4 mg by mouth every 8 (eight) hours as needed. 03/14/21   [provider]    Current Facility-Administered Medications  Medication Dose Route Frequency Provider Last Rate Last Admin  . [MAR Hold] 0.9  %  sodium chloride infusion   Intravenous PRN Lavina Hamman, MD 10 mL/hr at 03/15/21 1231 250 mL at 03/15/21 1231  . 0.9 %  sodium chloride infusion   Intravenous Continuous Ronnette Juniper, MD      . Doug Sou Hold] acetaminophen (TYLENOL) tablet 650 mg  650 mg Oral Q6H PRN Chotiner, Yevonne Aline, MD   650 mg at 03/15/21 0820   Or  . [MAR Hold] acetaminophen (TYLENOL) suppository 650 mg  650 mg Rectal Q6H PRN Chotiner, Yevonne Aline, MD      . Doug Sou Hold] escitalopram (LEXAPRO) tablet 10 mg  10 mg Oral Daily Chotiner, Yevonne Aline, MD   10 mg at 03/15/21 0820  . [MAR Hold] gabapentin (NEURONTIN) capsule 300 mg  300 mg Oral BID Chotiner, Yevonne Aline, MD   300 mg at 03/15/21 0820  . [MAR Hold] HYDROmorphone (DILAUDID) injection 1 mg  1 mg Intravenous Q3H PRN Chotiner, Yevonne Aline, MD      . [  MAR Hold] insulin aspart (novoLOG) injection 0-15 Units  0-15 Units Subcutaneous Q6H PRN Chotiner, Yevonne Aline, MD      . lactated ringers infusion   Intravenous Continuous Chotiner, Yevonne Aline, MD 125 mL/hr at 03/15/21 1239 New Bag at 03/15/21 1239  . lactated ringers infusion   Intravenous Continuous Ronnette Juniper, MD      . Doug Sou Hold] ondansetron [facility]) tablet 4 mg  4 mg Oral Q6H PRN Chotiner, Yevonne Aline, MD       Or  . Doug Sou Hold] ondansetron [facility]) injection 4 mg  4 mg Intravenous Q6H PRN Chotiner, Yevonne Aline, MD      . Doug Sou Hold] pantoprazole (PROTONIX) EC tablet 40 mg  40 mg Oral Daily Chotiner, Yevonne Aline, MD   40 mg at 03/15/21 0408  . [MAR Hold] piperacillin-tazobactam (ZOSYN) IVPB 3.375 g  3.375 g Intravenous Q8H Chotiner, Yevonne Aline, MD 12.5 mL/hr at 03/15/21 1234 3.375 g at 03/15/21 1234    Allergies as of 03/14/2021 - Review Complete 03/14/2021  Allergen Reaction Noted  . Other  05/03/2012  . Metronidazole Other (See Comments) 05/01/2012  . Septra [sulfamethoxazole-trimethoprim] Other (See Comments) 05/01/2012    Family History  Problem Relation Age of Onset  . Breast cancer Neg Hx     Social History    Socioeconomic History  . Marital status: Married    Spouse name: Not on file  . Number of children: Not on file  . Years of education: Not on file  . Highest education level: Not on file  Occupational History  . Not on file  Tobacco Use  . Smoking status: Former Smoker    Packs/day: 1.00    Years: 10.00    Pack years: 10.00    Quit date: 11/08/1981    Years since quitting: 39.3  . Smokeless tobacco: Never Used  Vaping Use  . Vaping Use: Never used  Substance and Sexual Activity  . Alcohol use: No  . Drug use: No  . Sexual activity: Not on file  Other Topics Concern  . Not on file  Social History Narrative  . Not on file   Social Determinants of Health   Financial Resource Strain: Not on file  Food Insecurity: Not on file  Transportation Needs: Not on file  Physical Activity: Not on file  Stress: Not on file  Social Connections: Not on file  Intimate Partner Violence: Not on file    Review of Systems: As per HPI  Physical Exam: Vital signs in last 24 hours: Temp:  [98.3 F (36.8 C)-98.6 F (37 C)] 98.3 F (36.8 C) (05/08 1443) Pulse Rate:  [69-90] 70 (05/08 1443) Resp:  [14-20] 17 (05/08 1443) BP: (93-172)/(53-107) 141/71 (05/08 1443) SpO2:  [91 %-98 %] 93 % (05/08 1443) Weight:  [108 kg-108.9 kg] 108.9 kg (05/08 1443)    General:   Alert,  Well-developed, overweight, pleasant and cooperative in NAD Head:  Normocephalic and atraumatic. Eyes: Mild icterus, mild pallor    Ears:  Normal auditory acuity. Nose:  No deformity, discharge,  or lesions. Mouth:  No deformity or lesions.  Oropharynx pink & moist. Neck:  Supple; no masses or thyromegaly. Lungs:  Clear throughout to auscultation.   No wheezes, crackles, or rhonchi. No acute distress. Heart:  Regular rate and rhythm; no murmurs, clicks, rubs,  or gallops. Extremities:  Without clubbing or edema. Neurologic:  Alert and  oriented x4;  grossly normal neurologically. Skin:  Intact without significant  lesions or rashes. Psych:  Alert and cooperative. Normal mood and affect. Abdomen:  Soft, mild epigastric tenderness and nondistended. No masses, hepatosplenomegaly or hernias noted. Normal bowel sounds, without guarding, and without rebound.         Lab Results: Recent Labs    03/14/21 2101 03/15/21 0734  WBC 3.9* 2.7*  HGB 12.7 11.4*  HCT 39.2 34.7*  PLT 201 157   BMET Recent Labs    03/14/21 2101 03/15/21 0734  NA 136 137  K 3.4* 3.2*  CL 98 103  CO2 26 25  GLUCOSE 179* 168*  BUN 12 10  CREATININE 0.76 0.72  CALCIUM 9.0 8.6*   LFT Recent Labs    03/15/21 0734  PROT 5.9*  ALBUMIN 2.8*  AST 123*  ALT 111*  ALKPHOS 208*  BILITOT 7.0*   PT/INR Recent Labs    03/15/21 0734  LABPROT 13.8  INR 1.1    Studies/Results: US Abdomen Limited  Result Date: 03/15/2021 CLINICAL DATA:  Right upper quadrant pain. EXAM: ULTRASOUND ABDOMEN LIMITED RIGHT UPPER QUADRANT COMPARISON:  Ultrasound abdomen 10/26/2019 FINDINGS: Gallbladder: Calcified gallstones and gallbladder sludge within the gallbladder lumen. Thickened gallbladder wall with associated pericholecystic fluid. A positive sonographic Murphy sign noted by sonographer. Common bile duct: Diameter: 9 mm. Liver: No focal lesion identified. Increased parenchymal echogenicity. Portal vein is patent on color Doppler imaging with normal direction of blood flow towards the liver. Other: None. IMPRESSION: 1. Cholelithiasis and gallbladder sludge with associated acute cholecystitis. 2. Choledocholithiasis not excluded in the setting of borderline enlarged common bile duct (9 mm). 3. Hepatic steatosis. Please note limited evaluation for focal hepatic masses in a patient with hepatic steatosis due to decreased penetration of the acoustic ultrasound waves. Electronically Signed   By: Iven Finn M.D.   On: 03/15/2021 01:14   MR ABDOMEN MRCP W WO CONTAST  Result Date: 03/15/2021 CLINICAL DATA:  Inpatient.  Severe acute gallstone  pancreatitis. EXAM: MRI ABDOMEN WITHOUT AND WITH CONTRAST (INCLUDING MRCP) TECHNIQUE: Multiplanar multisequence MR imaging of the abdomen was performed both before and after the administration of intravenous contrast. Heavily T2-weighted images of the biliary and pancreatic ducts were obtained, and three-dimensional MRCP images were rendered by post processing. CONTRAST:  57mL GADAVIST GADOBUTROL 1 MMOL/ML IV SOLN COMPARISON:  03/15/2021 abdominal sonogram. FINDINGS: Lower chest: No acute abnormality at the lung bases. Mild cardiomegaly. Hepatobiliary: Normal liver size and configuration. No significant hepatic steatosis. No liver mass. Several layering gallstones in the mildly distended gallbladder, largest 9 mm. Mild diffuse gallbladder wall thickening. No significant pericholecystic fluid. Minimal central intrahepatic biliary ductal dilatation. Dilated common bile duct with diameter 10 mm. There is a 4 mm stone at the ampulla (series 3/image 36). Otherwise no stones in the bile ducts. No enhancing biliary masses, biliary strictures or beading of the bile ducts. Pancreas: Very mild peripancreatic edema in the pancreatic head with preserved pancreatic parenchymal enhancement, compatible with acute non necrotizing pancreatitis. No pancreatic mass or duct dilation. No pancreas divisum. Spleen: Mild-to-moderate splenomegaly (craniocaudal splenic length 15.1 cm. Two small cystic splenic lesions, largest 1.3 cm with thin internal septations and no wall thickening or solid enhancement, compatible with lymphangiomas. Adrenals/Urinary Tract: Normal adrenals. No hydronephrosis. Tiny simple parapelvic renal cysts, left greater than right. No suspicious renal masses. Stomach/Bowel: Normal non-distended stomach. Visualized small and large bowel is normal caliber, with no bowel wall thickening. Marked diffuse colonic diverticulosis. Vascular/Lymphatic: Atherosclerotic nonaneurysmal abdominal aorta. Patent portal, splenic,  hepatic and renal veins. No pathologically enlarged lymph nodes in the abdomen.  Other: No abdominal ascites or focal fluid collection. Musculoskeletal: No aggressive appearing focal osseous lesions. IMPRESSION: 1. Mild acute non-necrotizing pancreatitis in the pancreatic head. 2. Solitary 4 mm ampullary stone. Dilated (10 mm diameter) common bile duct. Minimal central intrahepatic biliary ductal dilatation. 3. Cholelithiasis. Mild diffuse gallbladder wall thickening. No significant pericholecystic fluid. 4. Mild-to-moderate splenomegaly. 5. Marked diffuse colonic diverticulosis. Electronically Signed   By: Ilona Sorrel M.D.   On: 03/15/2021 10:56    Impression: Acute gallstone nonnecrotizing pancreatitis Abnormal LFTs,ampullary CBD stone  Family history of colon cancer in 2 first-degree relatives   Plan: ERCP today. The risks and the benefits of the procedure were discussed with the patient in details. She understands and verbalizes consent. Continue IV fluids, pain management. Will need surgical evaluation for cholecystectomy.   As an outpatient patient will need colonoscopy for high risk screening due to family history.   LOS: 0 days   Ronnette Juniper, MD  03/15/2021, 3:04 PM

## 2021-03-15 NOTE — Op Note (Signed)
[facility] Patient Name: Kathleen Wiley Procedure Date: 03/15/2021 MRN: [id number] Attending MD: Ronnette Juniper , MD Date of Birth: Sep 16, 1946 CSN: [id number] Age: 75 Admit Type: Inpatient Procedure:                ERCP Indications:              Common bile duct stone(s), Abnormal MRCP, Elevated                            liver enzymes, For therapy of acute pancreatitis Providers:                Ronnette Juniper, MD, Josie Dixon, RN, Laverda Sorenson,                            Technician, Glenis Smoker, CRNA Referring MD:             [facility] Medicines:                Monitored Anesthesia Care Complications:            No immediate complications. Estimated Blood Loss:     Estimated blood loss: none. Procedure:                Pre-Anesthesia Assessment:                           - Prior to the procedure, a History and Physical                            was performed, and patient medications and                            allergies were reviewed. The patient's tolerance of                            previous anesthesia was also reviewed. The risks                            and benefits of the procedure and the sedation                            options and risks were discussed with the patient.                            All questions were answered, and informed consent                            was obtained. Prior Anticoagulants: The patient has                            taken no previous anticoagulant or antiplatelet                            agents. ASA Grade Assessment: III - A patient with  severe systemic disease. After reviewing the risks                            and benefits, the patient was deemed in                            satisfactory condition to undergo the procedure.                           After obtaining informed consent, the scope was                            passed under direct vision. Throughout the                             procedure, the patient's blood pressure, pulse, and                            oxygen saturations were monitored continuously. The                            TJF-Q180V ([id number]) Olympus Duodenoscope was                            introduced through the mouth, and used to inject                            contrast into and used to inject contrast into the                            bile duct. The ERCP was accomplished without                            difficulty. The patient tolerated the procedure                            well. Scope In: Scope Out: Findings:      The scout film was normal. The esophagus was successfully intubated       under direct vision. The scope was advanced to a normal major papilla in       the descending duodenum without detailed examination of the pharynx,       larynx and associated structures, and upper GI tract. The upper GI tract       was grossly normal.      A small diverticulum was noted in the 3rd portion of the duodenum.      The bile duct was deeply cannulated with the sphincterotome on the first       attempt. Contrast was injected. I personally interpreted the bile duct       images. There was brisk flow of contrast through the ducts. Image       quality was excellent. Contrast extended to the main bile duct.      The lower third of the main bile duct contained filling defect(s)       thought to be a stone and sludge.  A straight Roadrunner wire was passed into the biliary tree.      A 10 mm biliary sphincterotomy was made with a braided sphincterotome       using ERBE electrocautery. There was no post-sphincterotomy bleeding.      The biliary tree was swept with a 12 mm balloon starting at the       bifurcation. A moderate amount of sludge was swept from the duct. One       stone was removed. No stones remained.      The pancreatic duct was not canulated or injected.      The patient was given rectal indomethacin at the  beginning of the       procedure. Impression:               - A filling defect consistent with a stone and                            sludge was seen on the cholangiogram.                           - Choledocholithiasis was found. Complete removal                            was accomplished by biliary sphincterotomy and                            balloon extraction.                           - A biliary sphincterotomy was performed.                           - The biliary tree was swept. Moderate Sedation:      Patient did not receive moderate sedation for this procedure, but       instead received monitored anesthesia care. Recommendation:           - Clear liquid diet, NPO post midnight.                           - Refer to a surgeon for cholecystectomy.                           - Continue management for GS pancreatitis- IVF,                            pain management Procedure Code(s):        --- Professional ---                           [phone], Endoscopic retrograde                            cholangiopancreatography (ERCP); with removal of                            calculi/debris from biliary/pancreatic duct(s)  43262, Endoscopic retrograde                            cholangiopancreatography (ERCP); with                            sphincterotomy/papillotomy Diagnosis Code(s):        --- Professional ---                           R93.2, Abnormal findings on diagnostic imaging of                            liver and biliary tract                           K80.50, Calculus of bile duct without cholangitis                            or cholecystitis without obstruction                           R74.8, Abnormal levels of other serum enzymes                           K85.90, Acute pancreatitis without necrosis or                            infection, unspecified CPT copyright 2019 American Medical Association. All rights reserved. The codes documented in this  report are preliminary and upon coder review may  be revised to meet current compliance requirements. Ronnette Juniper, MD 03/15/2021 4:32:06 PM This report has been signed electronically. Number of Addenda: 0

## 2021-03-15 NOTE — Anesthesia Preprocedure Evaluation (Addendum)
Anesthesia Evaluation  Patient identified by MRN, date of birth, ID band Patient awake    Reviewed: Allergy & Precautions, NPO status , Patient's Chart, lab work & pertinent test results, reviewed documented beta blocker date and time   History of Anesthesia Complications (+) PONV  Airway Mallampati: I  TM Distance: >3 FB Neck ROM: Full    Dental  (+) Dental Advisory Given   Pulmonary sleep apnea (does not use CPAP) , former smoker,  03/15/2021 SARS coronavirus NEG   breath sounds clear to auscultation       Cardiovascular hypertension, Pt. on medications and Pt. on home beta blockers (-) angina+ dysrhythmias Atrial Fibrillation  Rhythm:Regular Rate:Normal  '20 ECHO: EF 65-70%. Mild LVH, mild TR    Neuro/Psych Anxiety Depression negative neurological ROS     GI/Hepatic Neg liver ROS, GERD  Controlled,CBD stone: elevated LFTs   Endo/Other  diabetes (glu 143), Insulin DependentMorbid obesity  Renal/GU Renal InsufficiencyRenal disease     Musculoskeletal  (+) Arthritis ,   Abdominal (+) + obese,   Peds  Hematology  (+) Blood dyscrasia (Hb 11.4), anemia , REFUSES BLOOD PRODUCTS, JEHOVAH'S WITNESS (will accept albumin)  Anesthesia Other Findings   Reproductive/Obstetrics                            Anesthesia Physical Anesthesia Plan  ASA: III  Anesthesia Plan: General   Post-op Pain Management:    Induction:   PONV Risk Score and Plan: 4 or greater and Ondansetron, Dexamethasone and Treatment may vary due to age or medical condition  Airway Management Planned: Oral ETT  Additional Equipment: None  Intra-op Plan:   Post-operative Plan: Extubation in OR  Informed Consent: I have reviewed the patients History and Physical, chart, labs and discussed the procedure including the risks, benefits and alternatives for the proposed anesthesia with the patient or authorized representative who  has indicated his/her understanding and acceptance.     Dental advisory given  Plan Discussed with: CRNA and Surgeon  Anesthesia Plan Comments:        Anesthesia Quick Evaluation

## 2021-03-15 NOTE — Anesthesia Postprocedure Evaluation (Signed)
Anesthesia Post Note  Patient: XIMENA TODARO  Procedure(s) Performed: ENDOSCOPIC RETROGRADE CHOLANGIOPANCREATOGRAPHY (ERCP) WITH PROPOFOL (N/A ) SPHINCTEROTOMY REMOVAL OF STONES     Patient location during evaluation: PACU Anesthesia Type: General Level of consciousness: awake and alert, patient cooperative and oriented Pain management: pain level controlled Vital Signs Assessment: post-procedure vital signs reviewed and stable Respiratory status: spontaneous breathing, nonlabored ventilation and respiratory function stable Cardiovascular status: blood pressure returned to baseline and stable Postop Assessment: no apparent nausea or vomiting Anesthetic complications: no   No complications documented.  Last Vitals:  Vitals:   03/15/21 1745 03/15/21 1818  BP: (!) 128/59 136/72  Pulse: 71 69  Resp: 18 16  Temp:  36.7 C  SpO2: 97% 95%    Last Pain:  Vitals:   03/15/21 1818  TempSrc: Oral  PainSc:                  Kaylor Simenson,E. Treavor Blomquist

## 2021-03-15 NOTE — Transfer of Care (Signed)
Immediate Anesthesia Transfer of Care Note  Patient: Kathleen Wiley  Procedure(s) Performed: ENDOSCOPIC RETROGRADE CHOLANGIOPANCREATOGRAPHY (ERCP) WITH PROPOFOL (N/A ) SPHINCTEROTOMY REMOVAL OF STONES  Patient Location: PACU  Anesthesia Type:General  Level of Consciousness: awake and alert   Airway & Oxygen Therapy: Patient Spontanous Breathing and Patient connected to face mask oxygen  Post-op Assessment: Report given to RN and Post -op Vital signs reviewed and stable  Post vital signs: Reviewed and stable  Last Vitals:  Vitals Value Taken Time  BP    Temp    Pulse 72 03/15/21 1658  Resp 19 03/15/21 1658  SpO2 100 % 03/15/21 1658  Vitals shown include unvalidated device data.  Last Pain:  Vitals:   03/15/21 1443  TempSrc: Oral  PainSc: 0-No pain      Patients Stated Pain Goal: 3 (44/01/02 7253)  Complications: No complications documented.

## 2021-03-15 NOTE — Anesthesia Procedure Notes (Signed)
Date/Time: 03/15/2021 4:40 PM Performed by: Cynda Familia, CRNA Oxygen Delivery Method: Simple face mask Placement Confirmation: positive ETCO2 and breath sounds checked- equal and bilateral Dental Injury: Teeth and Oropharynx as per pre-operative assessment

## 2021-03-15 NOTE — Anesthesia Procedure Notes (Signed)
Procedure Name: Intubation Date/Time: 03/15/2021 3:57 PM Performed by: Cynda Familia, CRNA Pre-anesthesia Checklist: Patient identified, Emergency Drugs available, Suction available and Patient being monitored Patient Re-evaluated:Patient Re-evaluated prior to induction Oxygen Delivery Method: Circle System Utilized Preoxygenation: Pre-oxygenation with 100% oxygen Induction Type: IV induction Ventilation: Mask ventilation without difficulty Laryngoscope Size: Miller and 2 Grade View: Grade I Tube type: Oral Tube size: 7.0 mm Number of attempts: 1 Airway Equipment and Method: Stylet Placement Confirmation: ETT inserted through vocal cords under direct vision,  positive ETCO2 and breath sounds checked- equal and bilateral Secured at: 21 cm Tube secured with: Tape Dental Injury: Teeth and Oropharynx as per pre-operative assessment  Comments: Smooth IV induction Glennon Mac-- intubation AM CRAn atraumatic-- teeth as preop upper teeth chipped unchanged with laryngoscopy- bilat BS Glennon Mac

## 2021-03-15 NOTE — ED Notes (Signed)
ED TO INPATIENT HANDOFF REPORT  Name/Age/Gender Kathleen Wiley 75 y.o. female  Code Status    Code Status Orders  (From admission, onward)         Start     Ordered   03/15/21 0251  Full code  Continuous        03/15/21 0250        Code Status History    Date Active Date Inactive Code Status Order ID Comments User Context   10/25/2019 1629 10/30/2019 1909 Full Code [id number]  Reubin Milan, MD ED   09/18/2018 1620 09/19/2018 1630 Full Code [id number]  Leighton Parody, PA-C Inpatient   08/10/2016 0117 08/20/2016 1745 Full Code [id number]  Etta Quill, DO ED   01/24/2013 1607 01/26/2013 2051 Full Code [id number]  Verlon Au., PA-C Inpatient   Advance Care Planning Activity      Home/SNF/Other Home  Chief Complaint Acute gallstone pancreatitis [K85.10]  Level of Care/Admitting Diagnosis ED Disposition    ED Disposition Condition [facility] Area: [facility] [[id number]]  Level of Care: Med-Surg [16]  May admit patient to [facility] or [facility] if equivalent level of care is available:: Yes  Covid Evaluation: Asymptomatic Screening Protocol (No Symptoms)  Diagnosis: Acute gallstone pancreatitis [4081448]  Admitting Physician: Eben Burow [[id number]]  Attending Physician: Eben Burow [[id number]]  Estimated length of stay: past midnight tomorrow  Certification:: I certify this patient will need inpatient services for at least 2 midnights       Medical History Past Medical History:  Diagnosis Date  . Anxiety   . Arthritis    knee, hip  . Cellulitis 08/2016   left leg  . Complication of anesthesia   . Dysrhythmia    Afib due to Flagyl- 7 -2007- not problems since  . GERD (gastroesophageal reflux disease)   . Heart murmur    "slight "  informed years ago.-   . Hernia, umbilical   . History of atrial fibrillation 2007   with flagyl  . History of endometriosis   . Hypercholesteremia   .  Hypertension    has not been to a cardiologist  . PONV (postoperative nausea and vomiting)   . Pre-diabetes   . Rectal vaginal fistula   . Refusal of blood transfusions as patient is Jehovah's Witness     Allergies Allergies  Allergen Reactions  . Other     Refuses whole blood but does accept albumin  . Metronidazole Other (See Comments)    Chest pain, A-fib   . Septra [Sulfamethoxazole-Trimethoprim] Other (See Comments)    Blisters     IV Location/Drains/Wounds Patient Lines/Drains/Airways Status    Active Line/Drains/Airways    Name Placement date Placement time Site Days   Peripheral IV 03/14/21 Anterior;Left Hand 03/14/21  2257  Hand  1   Peripheral IV 03/15/21 Anterior;Proximal;Right Forearm 03/15/21  0237  Forearm  less than 1          Labs/Imaging Results for orders placed or performed during the hospital encounter of 03/14/21 (from the past 48 hour(s))  Lipase, blood     Status: Abnormal   Collection Time: 03/14/21  9:01 PM  Result Value Ref Range   Lipase 3,782 (H) 11 - 51 U/L    Comment: RESULTS CONFIRMED BY MANUAL DILUTION Performed at [facility], [place] [address], [place], [place] [address]   Comprehensive metabolic panel     Status: Abnormal  Collection Time: 03/14/21  9:01 PM  Result Value Ref Range   Sodium 136 135 - 145 mmol/L   Potassium 3.4 (L) 3.5 - 5.1 mmol/L   Chloride 98 98 - 111 mmol/L   CO2 26 22 - 32 mmol/L   Glucose, Bld 179 (H) 70 - 99 mg/dL    Comment: Glucose reference range applies only to samples taken after fasting for at least 8 hours.   BUN 12 8 - 23 mg/dL   Creatinine, Ser 0.76 0.44 - 1.00 mg/dL   Calcium 9.0 8.9 - 10.3 mg/dL   Total Protein 7.0 6.5 - 8.1 g/dL   Albumin 3.3 (L) 3.5 - 5.0 g/dL   AST 115 (H) 15 - 41 U/L   ALT 112 (H) 0 - 44 U/L   Alkaline Phosphatase 206 (H) 38 - 126 U/L   Total Bilirubin 6.0 (H) 0.3 - 1.2 mg/dL   GFR, Estimated >60 >60 mL/min    Comment: (NOTE) Calculated using the  CKD-EPI Creatinine Equation (2021)    Anion gap 12 5 - 15    Comment: Performed at [facility], [place] [address] [place], [place] [address]  CBC     Status: Abnormal   Collection Time: 03/14/21  9:01 PM  Result Value Ref Range   WBC 3.9 (L) 4.0 - 10.5 K/uL   RBC 3.93 3.87 - 5.11 MIL/uL   Hemoglobin 12.7 12.0 - 15.0 g/dL   HCT 39.2 36.0 - 46.0 %   MCV 99.7 80.0 - 100.0 fL   MCH 32.3 26.0 - 34.0 pg   MCHC 32.4 30.0 - 36.0 g/dL   RDW 14.3 11.5 - 15.5 %   Platelets 201 150 - 400 K/uL   nRBC 0.0 0.0 - 0.2 %    Comment: Performed at [facility], [place] [address] [place],  [address]   US Abdomen Limited  Result Date: 03/15/2021 CLINICAL DATA:  Right upper quadrant pain. EXAM: ULTRASOUND ABDOMEN LIMITED RIGHT UPPER QUADRANT COMPARISON:  Ultrasound abdomen 10/26/2019 FINDINGS: Gallbladder: Calcified gallstones and gallbladder sludge within the gallbladder lumen. Thickened gallbladder wall with associated pericholecystic fluid. A positive sonographic Murphy sign noted by sonographer. Common bile duct: Diameter: 9 mm. Liver: No focal lesion identified. Increased parenchymal echogenicity. Portal vein is patent on color Doppler imaging with normal direction of blood flow towards the liver. Other: None. IMPRESSION: 1. Cholelithiasis and gallbladder sludge with associated acute cholecystitis. 2. Choledocholithiasis not excluded in the setting of borderline enlarged common bile duct (9 mm). 3. Hepatic steatosis. Please note limited evaluation for focal hepatic masses in a patient with hepatic steatosis due to decreased penetration of the acoustic ultrasound waves. Electronically Signed   By: Iven Finn M.D.   On: 03/15/2021 01:14    Pending Labs Unresulted Labs (From admission, onward)          Start     Ordered   03/15/21 0500  Comprehensive metabolic panel  Tomorrow morning,   R        03/15/21 0250   03/15/21 0500  CBC  Tomorrow morning,   R         03/15/21 0250   03/15/21 0251  Hemoglobin A1c  Once,   STAT       Comments: To assess prior glycemic control    03/15/21 0250   03/15/21 0151  Resp Panel by RT-PCR (Flu A&B, Covid) Nasopharyngeal Swab  (Tier 2 - Symptomatic/asymptomatic with Precautions )  Once,   STAT  Question Answer Comment  Is this test for diagnosis or screening Screening   Symptomatic for COVID-19 as defined by CDC No   Hospitalized for COVID-19 No   Admitted to ICU for COVID-19 No   Previously tested for COVID-19 Yes   Resident in a congregate (group) care setting Unknown   Employed in healthcare setting Unknown   Pregnant No   Has patient completed COVID vaccination(s) (2 doses of Pfizer/Moderna 1 dose of [employer]) Unknown      03/15/21 0150   03/15/21 0037  Protime-INR  Once,   R        03/15/21 0037   03/14/21 2101  Urinalysis, Routine w reflex microscopic  ONCE - STAT,   STAT        03/14/21 2100          Vitals/Pain Today's Vitals   03/15/21 0030 03/15/21 0100 03/15/21 0130 03/15/21 0200  BP: (!) 152/77 (!) 155/88 (!) 154/82 (!) 160/93  Pulse: 78 84 84 87  Resp: 18 20 18 20   Temp:      TempSrc:      SpO2: 93% 91% 92% 95%  Weight:      Height:      PainSc:        Isolation Precautions Airborne and Contact precautions  Medications Medications  piperacillin-tazobactam (ZOSYN) IVPB 3.375 g (3.375 g Intravenous New Bag/Given 03/15/21 0251)  lactated ringers bolus 1,000 mL (has no administration in time range)  fentaNYL (SUBLIMAZE) injection 50 mcg (has no administration in time range)  atenolol-chlorthalidone (TENORETIC) 50-25 MG per tablet 1 tablet (has no administration in time range)  escitalopram (LEXAPRO) tablet 10 mg (has no administration in time range)  Semaglutide(0.25 or 0.5MG /DOS) SOPN 0.5 mg (has no administration in time range)  gabapentin (NEURONTIN) capsule 300 mg (has no administration in time range)  lactated ringers infusion (has no administration in time  range)  acetaminophen (TYLENOL) tablet 650 mg (has no administration in time range)    Or  acetaminophen (TYLENOL) suppository 650 mg (has no administration in time range)  HYDROmorphone (DILAUDID) injection 1 mg (has no administration in time range)  ondansetron (ZOFRAN) tablet 4 mg (has no administration in time range)    Or  ondansetron (ZOFRAN) injection 4 mg (has no administration in time range)  insulin aspart (novoLOG) injection 0-15 Units (has no administration in time range)  pantoprazole (PROTONIX) EC tablet 40 mg (has no administration in time range)  calcium carbonate (TUMS - dosed in mg elemental calcium) chewable tablet 200 mg of elemental calcium (has no administration in time range)  piperacillin-tazobactam (ZOSYN) IVPB 3.375 g (has no administration in time range)  ondansetron (ZOFRAN-ODT) disintegrating tablet 4 mg (4 mg Oral Given 03/14/21 2115)  sodium chloride 0.9 % bolus 1,000 mL (0 mLs Intravenous Stopped 03/15/21 0012)    Mobility walks

## 2021-03-16 ENCOUNTER — Inpatient Hospital Stay (HOSPITAL_COMMUNITY): Payer: Medicare Other | Admitting: Certified Registered"

## 2021-03-16 ENCOUNTER — Encounter (HOSPITAL_COMMUNITY)
Admission: EM | Disposition: A | Discharge status: Home / Self Care | Payer: Self-pay | Source: Home / Self Care | Attending: Internal Medicine

## 2021-03-16 ENCOUNTER — Encounter (HOSPITAL_COMMUNITY): Payer: Self-pay | Admitting: Family Medicine

## 2021-03-16 DIAGNOSIS — K851 Biliary acute pancreatitis without necrosis or infection: Secondary | ICD-10-CM | POA: Diagnosis not present

## 2021-03-16 HISTORY — PX: CHOLECYSTECTOMY: SHX55

## 2021-03-16 LAB — COMPREHENSIVE METABOLIC PANEL
ALT: 92 U/L — ABNORMAL HIGH (ref 0–44)
AST: 64 U/L — ABNORMAL HIGH (ref 15–41)
Albumin: 2.9 g/dL — ABNORMAL LOW (ref 3.5–5.0)
Alkaline Phosphatase: 201 U/L — ABNORMAL HIGH (ref 38–126)
Anion gap: 10 (ref 5–15)
BUN: 11 mg/dL (ref 8–23)
CO2: 23 mmol/L (ref 22–32)
Calcium: 8.6 mg/dL — ABNORMAL LOW (ref 8.9–10.3)
Chloride: 104 mmol/L (ref 98–111)
Creatinine, Ser: 0.76 mg/dL (ref 0.44–1.00)
GFR, Estimated: >60 mL/min (ref >60)
Glucose, Bld: 168 mg/dL — ABNORMAL HIGH (ref 70–99)
Potassium: 3.6 mmol/L (ref 3.5–5.1)
Sodium: 137 mmol/L (ref 135–145)
Total Bilirubin: 4.5 mg/dL — ABNORMAL HIGH (ref 0.3–1.2)
Total Protein: 6.1 g/dL — ABNORMAL LOW (ref 6.5–8.1)

## 2021-03-16 LAB — CBC
HCT: 36.4 % (ref 36.0–46.0)
Hemoglobin: 11.7 g/dL — ABNORMAL LOW (ref 12.0–15.0)
MCH: 33.1 pg (ref 26.0–34.0)
MCHC: 32.1 g/dL (ref 30.0–36.0)
MCV: 102.8 fL — ABNORMAL HIGH (ref 80.0–100.0)
Platelets: 157 10*3/uL (ref 150–400)
RBC: 3.54 MIL/uL — ABNORMAL LOW (ref 3.87–5.11)
RDW: 14.5 % (ref 11.5–15.5)
WBC: 2.1 10*3/uL — ABNORMAL LOW (ref 4.0–10.5)
nRBC: 0 % (ref 0.0–0.2)

## 2021-03-16 LAB — GLUCOSE, CAPILLARY
Glucose-Capillary: 130 mg/dL — ABNORMAL HIGH (ref 70–99)
Glucose-Capillary: 134 mg/dL — ABNORMAL HIGH (ref 70–99)
Glucose-Capillary: 169 mg/dL — ABNORMAL HIGH (ref 70–99)
Glucose-Capillary: 178 mg/dL — ABNORMAL HIGH (ref 70–99)
Glucose-Capillary: 230 mg/dL — ABNORMAL HIGH (ref 70–99)

## 2021-03-16 LAB — SURGICAL PCR SCREEN
MRSA, PCR: NEGATIVE
Staphylococcus aureus: NEGATIVE

## 2021-03-16 LAB — LIPASE, BLOOD: Lipase: 55 U/L — ABNORMAL HIGH (ref 11–51)

## 2021-03-16 SURGERY — LAPAROSCOPIC CHOLECYSTECTOMY WITH INTRAOPERATIVE CHOLANGIOGRAM
Anesthesia: General | Site: Abdomen

## 2021-03-16 MED ORDER — OXYCODONE HCL 5 MG PO TABS
5.0000 mg | ORAL_TABLET | ORAL | Status: DC | PRN
  Administered 2021-03-16 – 2021-03-17 (×3): 5 mg via ORAL
  Filled 2021-03-16 (×3): qty 1

## 2021-03-16 MED ORDER — LACTATED RINGERS IV SOLN: INTRAVENOUS | Status: DC

## 2021-03-16 MED ORDER — DEXAMETHASONE SODIUM PHOSPHATE 10 MG/ML IJ SOLN
INTRAMUSCULAR | Status: AC
  Filled 2021-03-16: qty 1

## 2021-03-16 MED ORDER — DEXAMETHASONE SODIUM PHOSPHATE 10 MG/ML IJ SOLN
INTRAMUSCULAR | Status: DC | PRN
  Administered 2021-03-16: 10 mg via INTRAVENOUS

## 2021-03-16 MED ORDER — SUGAMMADEX SODIUM 500 MG/5ML IV SOLN
INTRAVENOUS | Status: AC
  Filled 2021-03-16: qty 5

## 2021-03-16 MED ORDER — FENTANYL CITRATE (PF) 100 MCG/2ML IJ SOLN
INTRAMUSCULAR | Status: AC
  Filled 2021-03-16: qty 2

## 2021-03-16 MED ORDER — AMISULPRIDE (ANTIEMETIC) 5 MG/2ML IV SOLN
10.0000 mg | Freq: Once | INTRAVENOUS | Status: DC | PRN
Start: 2021-03-16 — End: 2021-03-16

## 2021-03-16 MED ORDER — SUGAMMADEX SODIUM 200 MG/2ML IV SOLN: INTRAVENOUS | Status: DC | PRN

## 2021-03-16 MED ORDER — FENTANYL CITRATE (PF) 100 MCG/2ML IJ SOLN
25.0000 ug | INTRAMUSCULAR | Status: DC | PRN
  Administered 2021-03-16: 50 ug via INTRAVENOUS

## 2021-03-16 MED ORDER — LACTATED RINGERS IR SOLN
Status: DC | PRN
  Administered 2021-03-16: 1000 mL

## 2021-03-16 MED ORDER — LIDOCAINE 2% (20 MG/ML) 5 ML SYRINGE
INTRAMUSCULAR | Status: AC
  Filled 2021-03-16: qty 5

## 2021-03-16 MED ORDER — ONDANSETRON HCL 4 MG/2ML IJ SOLN
INTRAMUSCULAR | Status: AC
  Filled 2021-03-16: qty 2

## 2021-03-16 MED ORDER — PROPOFOL 10 MG/ML IV BOLUS
INTRAVENOUS | Status: DC | PRN
  Administered 2021-03-16: 200 mg via INTRAVENOUS

## 2021-03-16 MED ORDER — EPHEDRINE SULFATE-NACL 50-0.9 MG/10ML-% IV SOSY
PREFILLED_SYRINGE | INTRAVENOUS | Status: DC | PRN
  Administered 2021-03-16: 10 mg via INTRAVENOUS

## 2021-03-16 MED ORDER — SUGAMMADEX SODIUM 500 MG/5ML IV SOLN
INTRAVENOUS | Status: DC | PRN
  Administered 2021-03-16: 250 mg via INTRAVENOUS

## 2021-03-16 MED ORDER — FENTANYL CITRATE (PF) 100 MCG/2ML IJ SOLN
INTRAMUSCULAR | Status: AC
  Administered 2021-03-16: 50 ug via INTRAVENOUS
  Filled 2021-03-16: qty 2

## 2021-03-16 MED ORDER — FENTANYL CITRATE (PF) 250 MCG/5ML IJ SOLN
INTRAMUSCULAR | Status: DC | PRN
  Administered 2021-03-16 (×2): 50 ug via INTRAVENOUS

## 2021-03-16 MED ORDER — ATROPINE SULFATE 0.4 MG/ML IV SOSY
PREFILLED_SYRINGE | INTRAVENOUS | Status: DC | PRN
  Administered 2021-03-16: .4 mg via INTRAVENOUS

## 2021-03-16 MED ORDER — LIDOCAINE HCL (PF) 1 % IJ SOLN
INTRAMUSCULAR | Status: DC | PRN
  Administered 2021-03-16: 5 mL

## 2021-03-16 MED ORDER — ONDANSETRON HCL 4 MG/2ML IJ SOLN
INTRAMUSCULAR | Status: DC | PRN
  Administered 2021-03-16: 4 mg via INTRAVENOUS

## 2021-03-16 MED ORDER — ROCURONIUM BROMIDE 10 MG/ML (PF) SYRINGE
PREFILLED_SYRINGE | INTRAVENOUS | Status: DC | PRN
  Administered 2021-03-16: 50 mg via INTRAVENOUS

## 2021-03-16 MED ORDER — LIDOCAINE 2% (20 MG/ML) 5 ML SYRINGE
INTRAMUSCULAR | Status: DC | PRN
  Administered 2021-03-16: 60 mg via INTRAVENOUS

## 2021-03-16 MED ORDER — INSULIN ASPART 100 UNIT/ML IJ SOLN
0.0000 [IU] | Freq: Four times a day (QID) | INTRAMUSCULAR | Status: DC
Start: 2021-03-16 — End: 2021-03-17
  Administered 2021-03-16 (×2): 3 [IU] via SUBCUTANEOUS
  Administered 2021-03-16 – 2021-03-17 (×2): 2 [IU] via SUBCUTANEOUS
  Administered 2021-03-17: 5 [IU] via SUBCUTANEOUS

## 2021-03-16 MED ORDER — HYDROMORPHONE HCL 1 MG/ML IJ SOLN
0.5000 mg | INTRAMUSCULAR | Status: DC | PRN
  Administered 2021-03-16: 1 mg via INTRAVENOUS
  Filled 2021-03-16: qty 1

## 2021-03-16 MED ORDER — PROPOFOL 10 MG/ML IV BOLUS
INTRAVENOUS | Status: AC
  Filled 2021-03-16: qty 20

## 2021-03-16 MED ORDER — ACETAMINOPHEN 325 MG PO TABS
650.0000 mg | ORAL_TABLET | Freq: Four times a day (QID) | ORAL | Status: DC
  Administered 2021-03-16 – 2021-03-17 (×3): 650 mg via ORAL
  Filled 2021-03-16 (×3): qty 2

## 2021-03-16 MED ORDER — CHLORHEXIDINE GLUCONATE 0.12 % MT SOLN
15.0000 mL | OROMUCOSAL | Status: AC
  Administered 2021-03-16: 15 mL via OROMUCOSAL

## 2021-03-16 MED ORDER — BUPIVACAINE-EPINEPHRINE (PF) 0.25% -1:200000 IJ SOLN
INTRAMUSCULAR | Status: DC | PRN
  Administered 2021-03-16: 5 mL via PERINEURAL

## 2021-03-16 MED ORDER — ROCURONIUM BROMIDE 10 MG/ML (PF) SYRINGE
PREFILLED_SYRINGE | INTRAVENOUS | Status: AC
  Filled 2021-03-16: qty 10

## 2021-03-16 MED ORDER — MIDAZOLAM HCL 2 MG/2ML IJ SOLN
INTRAMUSCULAR | Status: DC | PRN
  Administered 2021-03-16: 2 mg via INTRAVENOUS

## 2021-03-16 MED ORDER — MIDAZOLAM HCL 2 MG/2ML IJ SOLN
INTRAMUSCULAR | Status: AC
  Filled 2021-03-16: qty 2

## 2021-03-16 SURGICAL SUPPLY — 42 items
APPLIER CLIP ROT 10 11.4 M/L (STAPLE) ×2
CABLE HIGH FREQUENCY MONO STRZ (ELECTRODE) ×2 IMPLANT
CHLORAPREP W/TINT 26 (MISCELLANEOUS) ×2 IMPLANT
CLIP APPLIE ROT 10 11.4 M/L (STAPLE) ×1 IMPLANT
CLIP VESOLOCK MED LG 6/CT (CLIP) IMPLANT
COVER MAYO STAND STRL (DRAPES) IMPLANT
COVER SURGICAL LIGHT HANDLE (MISCELLANEOUS) ×2 IMPLANT
COVER WAND RF STERILE (DRAPES) IMPLANT
DECANTER SPIKE VIAL GLASS SM (MISCELLANEOUS) ×2 IMPLANT
DERMABOND ADVANCED (GAUZE/BANDAGES/DRESSINGS)
DERMABOND ADVANCED .7 DNX12 (GAUZE/BANDAGES/DRESSINGS) IMPLANT
DRAPE C-ARM 42X120 X-RAY (DRAPES) IMPLANT
ELECT L-HOOK LAP 45CM DISP (ELECTROSURGICAL) ×2
ELECT REM PT RETURN 15FT ADLT (MISCELLANEOUS) ×2 IMPLANT
ELECTRODE L-HOOK LAP 45CM DISP (ELECTROSURGICAL) ×1 IMPLANT
GLOVE SURG ENC MOIS LTX SZ6 (GLOVE) ×2 IMPLANT
GLOVE SURG UNDER LTX SZ6.5 (GLOVE) ×2 IMPLANT
GOWN STRL REUS W/TWL 2XL LVL3 (GOWN DISPOSABLE) ×2 IMPLANT
GOWN STRL REUS W/TWL XL LVL3 (GOWN DISPOSABLE) ×4 IMPLANT
HEMOSTAT SNOW SURGICEL 2X4 (HEMOSTASIS) IMPLANT
KIT BASIN OR (CUSTOM PROCEDURE TRAY) ×2 IMPLANT
KIT TURNOVER KIT A (KITS) ×2 IMPLANT
L-HOOK LAP DISP 36CM (ELECTROSURGICAL) ×2
LHOOK LAP DISP 36CM (ELECTROSURGICAL) ×1 IMPLANT
PENCIL SMOKE EVACUATOR (MISCELLANEOUS) IMPLANT
POUCH RETRIEVAL ECOSAC 10 (ENDOMECHANICALS) ×2 IMPLANT
POUCH RETRIEVAL ECOSAC 10MM (ENDOMECHANICALS) ×4
POUCH SPECIMEN RETRIEVAL 10MM (ENDOMECHANICALS) ×2 IMPLANT
PROTECTOR NERVE ULNAR (MISCELLANEOUS) IMPLANT
SCISSORS LAP 5X35 DISP (ENDOMECHANICALS) ×2 IMPLANT
SET CHOLANGIOGRAPH MIX (MISCELLANEOUS) IMPLANT
SET IRRIG TUBING LAPAROSCOPIC (IRRIGATION / IRRIGATOR) ×2 IMPLANT
SET TUBE SMOKE EVAC HIGH FLOW (TUBING) IMPLANT
SLEEVE XCEL OPT CAN 5 100 (ENDOMECHANICALS) ×2 IMPLANT
SUT MNCRL AB 4-0 PS2 18 (SUTURE) ×2 IMPLANT
TAPE CLOTH 4X10 WHT NS (GAUZE/BANDAGES/DRESSINGS) IMPLANT
TOWEL OR 17X26 10 PK STRL BLUE (TOWEL DISPOSABLE) ×2 IMPLANT
TOWEL OR NON WOVEN STRL DISP B (DISPOSABLE) ×2 IMPLANT
TRAY LAPAROSCOPIC (CUSTOM PROCEDURE TRAY) ×2 IMPLANT
TROCAR BLADELESS OPT 5 100 (ENDOMECHANICALS) ×2 IMPLANT
TROCAR XCEL BLUNT TIP 100MML (ENDOMECHANICALS) ×4 IMPLANT
TROCAR XCEL NON-BLD 11X100MML (ENDOMECHANICALS) ×2 IMPLANT

## 2021-03-16 NOTE — Anesthesia Postprocedure Evaluation (Signed)
Anesthesia Post Note  Patient: Kathleen Wiley  Procedure(s) Performed: LAPAROSCOPIC CHOLECYSTECTOMY (N/A Abdomen)     Patient location during evaluation: PACU Anesthesia Type: General Level of consciousness: awake and alert Pain management: pain level controlled Vital Signs Assessment: post-procedure vital signs reviewed and stable Respiratory status: spontaneous breathing, nonlabored ventilation, respiratory function stable and patient connected to nasal cannula oxygen Cardiovascular status: blood pressure returned to baseline and stable Postop Assessment: no apparent nausea or vomiting Anesthetic complications: no   No complications documented.  Last Vitals:  Vitals:   03/16/21 1640 03/16/21 1848  BP: (!) 145/68 125/62  Pulse: 65 81  Resp: 18   Temp: (!) 36.3 C 36.7 C  SpO2: 100% 93%    Last Pain:  Vitals:   03/16/21 1848  TempSrc: Oral  PainSc:                  Tiajuana Amass

## 2021-03-16 NOTE — Plan of Care (Signed)
  Problem: Activity: Goal: Risk for activity intolerance will decrease Outcome: Progressing   Problem: Nutrition: Goal: Adequate nutrition will be maintained Outcome: Progressing   Problem: Coping: Goal: Level of anxiety will decrease Outcome: Progressing   Problem: Pain Managment: Goal: General experience of comfort will improve Outcome: Progressing   Problem: Safety: Goal: Ability to remain free from injury will improve Outcome: Progressing   

## 2021-03-16 NOTE — Op Note (Signed)
Laparoscopic Cholecystectomy and primary repair of umbilical hernia  Indications: This patient presents with gallstone pancreatitis and will undergo laparoscopic cholecystectomy and umbilical hernia.  Pre-operative Diagnosis: gallstone pancreatitis and umbilical hernia  Post-operative Diagnosis: Same and cirrhosis  Surgeon: Stark Klein   Assistants: Saverio Danker, PA-C  Anesthesia: General endotracheal anesthesia and local  ASA Class: 3  Procedure Details  The patient was seen again in the Holding Room. The risks, benefits, complications, treatment options, and expected outcomes were discussed with the patient. The possibilities of  bleeding, recurrent infection, damage to nearby structures, the need for additional procedures, failure to diagnose a condition, the possible need to convert to an open procedure, and creating a complication requiring transfusion or operation were discussed with the patient. The likelihood of improving the patient's symptoms with return to their baseline status is good.    The patient and/or family concurred with the proposed plan, giving informed consent. The site of surgery properly noted. The patient was taken to Operating Room, and the procedure verified as Laparoscopic Cholecystectomy. A Time Out was held and the above information confirmed.  Prior to the induction of general anesthesia, antibiotic prophylaxis was administered. General endotracheal anesthesia was then administered and tolerated well. After the induction, the abdomen was prepped with Chloraprep and draped in the sterile fashion. The patient was positioned in the supine position.  Local anesthetic agent was injected into the skin near the umbilicus and an inframumbilical circumlinear incision was made with a #11 blade. I dissected down to the abdominal fascia with blunt dissection.  The fascia was incised vertically into the hernia sac and we entered the peritoneal cavity bluntly.  A  pursestring suture of 0-Vicryl was placed around the fascial opening.  The Hasson cannula was inserted and secured with the stay suture.  Pneumoperitoneum was then created with CO2 and tolerated well without any adverse changes in the patient's vital signs. An 11-mm port was placed in the subxiphoid position.  Two 5-mm ports were placed in the right upper quadrant. All skin incisions were infiltrated with a local anesthetic agent before making the incision and placing the trocars.   We positioned the patient in reverse Trendelenburg, tilted slightly to the patient's left.  The gallbladder was identified, the fundus grasped and retracted cephalad. Adhesions were lysed bluntly and with the electrocautery where indicated, taking care not to injure any adjacent organs or viscus. The infundibulum was grasped and retracted laterally, exposing the peritoneum overlying the triangle of Calot. This was then divided and exposed in a blunt fashion. The cystic artery was very friable and started bleeding.  This was doubly clipped.  The cystic duct was thenskeltonized,  ligated with clips and divided. The clips did not quite go all the way across the duct, so an endoloop was placed at the base of the cystic duct.   The gallbladder was dissected from the liver bed in retrograde fashion with the electrocautery. Several large stones spilled and were retrieved with the stone forceps. The gallbladder was removed and placed in an Endocatch bag.  The gallbladder and Endocatch bag were then removed through the umbilical port site.  The liver bed was irrigated and inspected. Hemostasis was achieved with the electrocautery. Copious irrigation was utilized and was repeatedly aspirated until clear.    We again inspected the right upper quadrant for hemostasis.  A piece of SNOW hemostatic agent was placed in the gallbladder fossa.  Pneumoperitoneum was released as we removed the trocars.   The pursestring suture was  used to close the  umbilical fascia at the hernia defect.  An additional 0-0 vicryl was placed to close down the remaining fascial defect.  Some of the skin was resected from the hernia.  Vicryl was used to tack down the skin to the fascia to avoid a large space.  Marland Kitchen  4-0 Monocryl was used to close the skin.   The skin was cleaned and dry, and Dermabond was applied. The patient was then extubated and brought to the recovery room in stable condition. Instrument, sponge, and needle counts were correct at closure and at the conclusion of the case.   Findings: Cirrhotic liver, pigmented stones, 1.5 cm hernia defect.    Estimated Blood Loss: 25 mL                Specimens: Gallbladder to pathology       Complications: None; patient tolerated the procedure well.         Disposition: PACU - hemodynamically stable.         Condition: stable

## 2021-03-16 NOTE — Progress Notes (Signed)
[facility] Gastroenterology Progress Note  Kathleen Wiley 75 y.o. [birth year]  CC:  Gallstone pancreatitis s/p ERCP 5/8  Subjective: Patient states she is feeling well this morning and denies abdominal pain, nausea, and vomiting.  She reports a soft stool this morning.  She states her throat is a little sore.  ROS : Review of Systems  HENT: Positive for sore throat. Negative for congestion.   Gastrointestinal: Negative for abdominal pain, blood in stool, constipation, diarrhea, heartburn, melena, nausea and vomiting.    Objective: Vital signs in last 24 hours: Vitals:   03/16/21 0225 03/16/21 0619  BP: 121/67 131/72  Pulse: (!) 57 (!) 57  Resp: 16 16  Temp: 98.7 F (37.1 C) 98.1 F (36.7 C)  SpO2: 94% 94%    Physical Exam:  General:  Alert, cooperative, no distress, appears stated age  Head:  Normocephalic, without obvious abnormality, atraumatic  Eyes:  Scleral icterus, EOMs intact  Lungs:   Clear to auscultation bilaterally, respirations unlabored  Heart:  Mildly bradycardic with regular rhythm, S1, S2 normal  Abdomen:   Soft, non-tender, bowel sounds active all four quadrants,  no guarding or peritoneal signs   Extremities: Extremities normal, atraumatic, no  edema    Lab Results: Recent Labs    03/15/21 0734 03/16/21 0832  NA 137 137  K 3.2* 3.6  CL 103 104  CO2 25 23  GLUCOSE 168* 168*  BUN 10 11  CREATININE 0.72 0.76  CALCIUM 8.6* 8.6*   Recent Labs    03/15/21 0734 03/16/21 0832  AST 123* 64*  ALT 111* 92*  ALKPHOS 208* 201*  BILITOT 7.0* 4.5*  PROT 5.9* 6.1*  ALBUMIN 2.8* 2.9*   Recent Labs    03/15/21 0734 03/16/21 0832  WBC 2.7* 2.1*  HGB 11.4* 11.7*  HCT 34.7* 36.4  MCV 99.7 102.8*  PLT 157 157   Recent Labs    03/15/21 0734  LABPROT 13.8  INR 1.1    Assessment: Acute gallstone nonnecrotizing pancreatitis s/p ERCP with stone removal 5/8 -Lipase 55, improved from 3782 on arrival -T. Bili 4.5, improved from 7.0 yesterday -AST 64/  ALT 92/ ALP 201, mildly improved from yesterday  Family history of colon cancer in 2 first-degree relatives   Plan: Patient is undergoing laparoscopic cholecystectomy today.  Further management as per surgical team.  Continue supportive care.  Trend LFTs to normalization as an outpatient.  As an outpatient, patient will need colonoscopy for high risk screening due to family history.    [facility] will sign off. Please contact us if we can be of any further assistance during this hospital stay.  Salley Slaughter PA-C 03/16/2021, 10:18 AM  Contact #  [phone]

## 2021-03-16 NOTE — Anesthesia Preprocedure Evaluation (Signed)
Anesthesia Evaluation  Patient identified by MRN, date of birth, ID band Patient awake    Reviewed: Allergy & Precautions, NPO status , Patient's Chart, lab work & pertinent test results, reviewed documented beta blocker date and time   History of Anesthesia Complications (+) PONV  Airway Mallampati: I  TM Distance: >3 FB Neck ROM: Full    Dental  (+) Dental Advisory Given   Pulmonary sleep apnea (does not use CPAP) , former smoker,  03/15/2021 SARS coronavirus NEG   breath sounds clear to auscultation       Cardiovascular hypertension, Pt. on medications and Pt. on home beta blockers (-) angina+ dysrhythmias Atrial Fibrillation  Rhythm:Regular Rate:Normal  '20 ECHO: EF 65-70%. Mild LVH, mild TR    Neuro/Psych Anxiety Depression negative neurological ROS     GI/Hepatic Neg liver ROS, GERD  Controlled,CBD stone: elevated LFTs   Endo/Other  diabetes, Insulin DependentMorbid obesity  Renal/GU Renal InsufficiencyRenal disease     Musculoskeletal  (+) Arthritis ,   Abdominal (+) + obese,   Peds  Hematology  (+) Blood dyscrasia (Hb 11.4), anemia , REFUSES BLOOD PRODUCTS, JEHOVAH'S WITNESS (will accept albumin)  Anesthesia Other Findings   Reproductive/Obstetrics                             Lab Results  Component Value Date   WBC 2.1 (L) 03/16/2021   HGB 11.7 (L) 03/16/2021   HCT 36.4 03/16/2021   MCV 102.8 (H) 03/16/2021   PLT 157 03/16/2021   Lab Results  Component Value Date   CREATININE 0.76 03/16/2021   BUN 11 03/16/2021   NA 137 03/16/2021   K 3.6 03/16/2021   CL 104 03/16/2021   CO2 23 03/16/2021    Anesthesia Physical  Anesthesia Plan  ASA: III  Anesthesia Plan: General   Post-op Pain Management:    Induction:   PONV Risk Score and Plan: 4 or greater and Ondansetron, Dexamethasone and Treatment may vary due to age or medical condition  Airway Management Planned:  Oral ETT  Additional Equipment: None  Intra-op Plan:   Post-operative Plan: Extubation in OR  Informed Consent: I have reviewed the patients History and Physical, chart, labs and discussed the procedure including the risks, benefits and alternatives for the proposed anesthesia with the patient or authorized representative who has indicated his/her understanding and acceptance.     Dental advisory given  Plan Discussed with: CRNA and Surgeon  Anesthesia Plan Comments:         Anesthesia Quick Evaluation

## 2021-03-16 NOTE — Anesthesia Procedure Notes (Signed)
Procedure Name: Intubation Date/Time: 03/16/2021 2:09 PM Performed by: Sharlette Dense, CRNA Patient Re-evaluated:Patient Re-evaluated prior to induction Oxygen Delivery Method: Circle system utilized Preoxygenation: Pre-oxygenation with 100% oxygen Induction Type: IV induction Ventilation: Mask ventilation without difficulty and Oral airway inserted - appropriate to patient size Laryngoscope Size: Sabra Heck and 2 Grade View: Grade I Tube type: Oral Tube size: 7.0 mm Number of attempts: 1 Airway Equipment and Method: Stylet Placement Confirmation: ETT inserted through vocal cords under direct vision,  positive ETCO2 and breath sounds checked- equal and bilateral Secured at: 21 cm Tube secured with: Tape Dental Injury: Teeth and Oropharynx as per pre-operative assessment

## 2021-03-16 NOTE — Transfer of Care (Signed)
Immediate Anesthesia Transfer of Care Note  Patient: Kathleen Wiley  Procedure(s) Performed: LAPAROSCOPIC CHOLECYSTECTOMY (N/A Abdomen)  Patient Location: PACU  Anesthesia Type:General  Level of Consciousness: awake  Airway & Oxygen Therapy: Patient Spontanous Breathing and Patient connected to face mask oxygen  Post-op Assessment: Report given to RN and Post -op Vital signs reviewed and stable  Post vital signs: Reviewed and stable  Last Vitals:  Vitals Value Taken Time  BP 146/75 03/16/21 1545  Temp 36.6 C 03/16/21 1543  Pulse 76 03/16/21 1546  Resp 18 03/16/21 1546  SpO2 98 % 03/16/21 1546  Vitals shown include unvalidated device data.  Last Pain:  Vitals:   03/16/21 1543  TempSrc:   PainSc: Asleep      Patients Stated Pain Goal: 0 (45/80/99 8338)  Complications: No complications documented.

## 2021-03-16 NOTE — Progress Notes (Signed)
[facility] Progress Note  Patient: Kathleen Wiley    FAO:[id number]  DOA: 03/14/2021     Date of Service: the patient was seen and examined on 03/16/2021  Brief hospital course: Past medical history of HTN, type II DM, OA, obesity.  Presents with complaints of abdominal pain and nausea.  Found to have cholecystitis with CBD stone and acute pancreatitis. GI was consulted.  Underwent MRCP followed by ERCP to remove the stone. General surgery consulted underwent laparoscopy cholecystectomy. Currently plan is monitor postop recovery.  Assessment and Plan: 1.  Acute cholecystitis. Gallstone pancreatitis. Choledocholithiasis Presents with complaints of abdominal pain. Elevated LFT. Lipase was also 8000. Ultrasound shows evidence of cholecystitis with CBD stone. Underwent MRCP which confirmed the diagnosis for CBD stone. GI was consulted. Underwent ERCP on 5/8.  Stones were removed. General surgery was consulted Underwent lap chole on 5/9.  Tolerated procedure very well. Diet management and pain management per surgery. Appreciate GI and general surgery assistance in managing this patient. Currently on antibiotics with Zosyn.  2.  HTN Blood pressure stable. Monitor.  3.  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  4.  Hypokalemia Resolved.  Monitor.  5.  Obesity Placing the patient at high risk for poor outcome. Likely contributing to gallbladder stone. Body mass index is 39.95 kg/m.   Diet: Per surgery DVT Prophylaxis:   Place TED hose Start: 03/15/21 0251    Advance goals of care discussion: Full code  Family Communication: no family was present at bedside, at the time of interview.   Disposition:  Status is: Inpatient  Remains inpatient appropriate because:Ongoing diagnostic testing needed not appropriate for outpatient work up   Dispo: The patient is from: Home              Anticipated d/c  is to: Home              Patient currently is not medically stable to d/c.   Difficult to place patient No        Subjective: No nausea no vomiting.  No fever no chills.  No abdominal pain.  Passing gas.  Physical Exam:  General: Appear in mild distress, no Rash; Oral Mucosa Clear, moist. no Abnormal Neck Mass Or lumps, Conjunctiva normal  Cardiovascular: S1 and S2 Present, no Murmur, Respiratory: good respiratory effort, Bilateral Air entry present and CTA, no Crackles, no wheezes Abdomen: Bowel Sound present, Soft and no tenderness Extremities: no Pedal edema Neurology: alert and oriented to time, place, and person affect appropriate. no new focal deficit Gait not checked due to patient safety concerns  Vitals:   03/16/21 1545 03/16/21 1600 03/16/21 1615 03/16/21 1640  BP: (!) 146/75 (!) 144/71 137/71 (!) 145/68  Pulse: 75 72 67 65  Resp: 18 13 13 18   Temp:   (!) 97.5 F (36.4 C) (!) 97.4 F (36.3 C)  TempSrc:    Oral  SpO2: 97% 95% 96% 100%  Weight:      Height:        Intake/Output Summary (Last 24 hours) at 03/16/2021 1704 Last data filed at 03/16/2021 1546 Gross per 24 hour  Intake 2456.02 ml  Output 100 ml  Net 2356.02 ml   Filed Weights   03/15/21 0448 03/15/21 1443 03/16/21 1313  Weight: 108 kg 108.9 kg 108.9 kg    Data Reviewed: I have personally reviewed and interpreted daily labs, tele strips, imaging. I reviewed all nursing notes, pharmacy  notes, vitals, pertinent old records I have discussed plan of care as described above with RN and patient/family.  CBC: Recent Labs  Lab 03/14/21 2101 03/15/21 0734 03/16/21 0832  WBC 3.9* 2.7* 2.1*  HGB 12.7 11.4* 11.7*  HCT 39.2 34.7* 36.4  MCV 99.7 99.7 102.8*  PLT 201 157 423   Basic Metabolic Panel: Recent Labs  Lab 03/14/21 2101 03/15/21 0734 03/16/21 0832  NA 136 137 137  K 3.4* 3.2* 3.6  CL 98 103 104  CO2 26 25 23   GLUCOSE 179* 168* 168*  BUN 12 10 11   CREATININE 0.76 0.72 0.76  CALCIUM  9.0 8.6* 8.6*    Studies: No results found.  Scheduled Meds: . acetaminophen  650 mg Oral Q6H  . escitalopram  10 mg Oral Daily  . gabapentin  300 mg Oral BID  . insulin aspart  0-15 Units Subcutaneous Q6H  . [START ON 03/18/2021] oxybutynin  1 patch Transdermal Q72H  . pantoprazole  40 mg Oral Daily  . cyanocobalamin  1,000 mcg Oral Daily   Continuous Infusions: . sodium chloride 250 mL (03/15/21 2025)  . lactated ringers 50 mL/hr at 03/16/21 1659   PRN Meds: sodium chloride, fluticasone, HYDROmorphone (DILAUDID) injection, ondansetron **OR** ondansetron (ZOFRAN) IV, oxyCODONE, phenol  Time spent: 35 minutes  Author: Berle Mull, MD [facility] 03/16/2021 5:04 PM  To reach On-call, see care teams to locate the attending and reach out via [email or web address]. Between 7PM-7AM, please contact night-coverage If you still have difficulty reaching the attending provider, please page the [facility] (Director on Call) for [facility] on amion for assistance.

## 2021-03-16 NOTE — Progress Notes (Signed)
1 Day Post-Op  Subjective: Feels well today.  No pain, no complaints.  Tolerated ERCP well yesterday.  ROS: See above, otherwise other systems negative  Objective: Vital signs in last 24 hours: Temp:  [97.7 F (36.5 C)-98.7 F (37.1 C)] 98.1 F (36.7 C) (05/09 0619) Pulse Rate:  [57-72] 57 (05/09 0619) Resp:  [14-19] 16 (05/09 0619) BP: (105-141)/(56-74) 131/72 (05/09 0619) SpO2:  [92 %-100 %] 94 % (05/09 0619) Weight:  [108.9 kg] 108.9 kg (05/08 1443) Last BM Date: 03/14/21  Intake/Output from previous day: 05/08 0701 - 05/09 0700 In: 3295.7 [P.O.:120; I.V.:3034.2; IV Piggyback:141.5] Out: 0  Intake/Output this shift: No intake/output data recorded.  PE: Gen: NAD Heart: regular Lungs: CTAB Abd: obese, +BS, NT, ND, umbilical hernia soft and reducible  Lab Results:  Recent Labs    03/15/21 0734 03/16/21 0832  WBC 2.7* 2.1*  HGB 11.4* 11.7*  HCT 34.7* 36.4  PLT 157 157   BMET Recent Labs    03/15/21 0734 03/16/21 0832  NA 137 137  K 3.2* 3.6  CL 103 104  CO2 25 23  GLUCOSE 168* 168*  BUN 10 11  CREATININE 0.72 0.76  CALCIUM 8.6* 8.6*   PT/INR Recent Labs    03/15/21 0734  LABPROT 13.8  INR 1.1   CMP     Component Value Date/Time   NA 137 03/16/2021 0832   K 3.6 03/16/2021 0832   CL 104 03/16/2021 0832   CO2 23 03/16/2021 0832   GLUCOSE 168 (H) 03/16/2021 0832   BUN 11 03/16/2021 0832   CREATININE 0.76 03/16/2021 0832   CALCIUM 8.6 (L) 03/16/2021 0832   PROT 6.1 (L) 03/16/2021 0832   ALBUMIN 2.9 (L) 03/16/2021 0832   AST 64 (H) 03/16/2021 0832   ALT 92 (H) 03/16/2021 0832   ALKPHOS 201 (H) 03/16/2021 0832   BILITOT 4.5 (H) 03/16/2021 0832   GFRNONAA >60 03/16/2021 0832   GFRAA >60 10/30/2019 0412   Lipase     Component Value Date/Time   LIPASE 55 (H) 03/16/2021 0832       Studies/Results: MR 3D Recon At Scanner  Result Date: 03/16/2021 CLINICAL DATA:  Inpatient.  Severe acute gallstone pancreatitis. EXAM: MRI ABDOMEN  WITHOUT AND WITH CONTRAST (INCLUDING MRCP) TECHNIQUE: Multiplanar multisequence MR imaging of the abdomen was performed both before and after the administration of intravenous contrast. Heavily T2-weighted images of the biliary and pancreatic ducts were obtained, and three-dimensional MRCP images were rendered by post processing. CONTRAST:  20mL GADAVIST GADOBUTROL 1 MMOL/ML IV SOLN COMPARISON:  03/15/2021 abdominal sonogram. FINDINGS: Lower chest: No acute abnormality at the lung bases. Mild cardiomegaly. Hepatobiliary: Normal liver size and configuration. No significant hepatic steatosis. No liver mass. Several layering gallstones in the mildly distended gallbladder, largest 9 mm. Mild diffuse gallbladder wall thickening. No significant pericholecystic fluid. Minimal central intrahepatic biliary ductal dilatation. Dilated common bile duct with diameter 10 mm. There is a 4 mm stone at the ampulla (series 3/image 36). Otherwise no stones in the bile ducts. No enhancing biliary masses, biliary strictures or beading of the bile ducts. Pancreas: Very mild peripancreatic edema in the pancreatic head with preserved pancreatic parenchymal enhancement, compatible with acute non necrotizing pancreatitis. No pancreatic mass or duct dilation. No pancreas divisum. Spleen: Mild-to-moderate splenomegaly (craniocaudal splenic length 15.1 cm. Two small cystic splenic lesions, largest 1.3 cm with thin internal septations and no wall thickening or solid enhancement, compatible with lymphangiomas. Adrenals/Urinary Tract: Normal adrenals. No hydronephrosis. Tiny simple parapelvic  renal cysts, left greater than right. No suspicious renal masses. Stomach/Bowel: Normal non-distended stomach. Visualized small and large bowel is normal caliber, with no bowel wall thickening. Marked diffuse colonic diverticulosis. Vascular/Lymphatic: Atherosclerotic nonaneurysmal abdominal aorta. Patent portal, splenic, hepatic and renal veins. No  pathologically enlarged lymph nodes in the abdomen. Other: No abdominal ascites or focal fluid collection. Musculoskeletal: No aggressive appearing focal osseous lesions. IMPRESSION: 1. Mild acute non-necrotizing pancreatitis in the pancreatic head. 2. Solitary 4 mm ampullary stone. Dilated (10 mm diameter) common bile duct. Minimal central intrahepatic biliary ductal dilatation. 3. Cholelithiasis. Mild diffuse gallbladder wall thickening. No significant pericholecystic fluid. 4. Mild-to-moderate splenomegaly. 5. Marked diffuse colonic diverticulosis. Electronically Signed   By: Ilona Sorrel M.D.   On: 03/15/2021 10:56   US Abdomen Limited  Result Date: 03/15/2021 CLINICAL DATA:  Right upper quadrant pain. EXAM: ULTRASOUND ABDOMEN LIMITED RIGHT UPPER QUADRANT COMPARISON:  Ultrasound abdomen 10/26/2019 FINDINGS: Gallbladder: Calcified gallstones and gallbladder sludge within the gallbladder lumen. Thickened gallbladder wall with associated pericholecystic fluid. A positive sonographic Murphy sign noted by sonographer. Common bile duct: Diameter: 9 mm. Liver: No focal lesion identified. Increased parenchymal echogenicity. Portal vein is patent on color Doppler imaging with normal direction of blood flow towards the liver. Other: None. IMPRESSION: 1. Cholelithiasis and gallbladder sludge with associated acute cholecystitis. 2. Choledocholithiasis not excluded in the setting of borderline enlarged common bile duct (9 mm). 3. Hepatic steatosis. Please note limited evaluation for focal hepatic masses in a patient with hepatic steatosis due to decreased penetration of the acoustic ultrasound waves. Electronically Signed   By: Iven Finn M.D.   On: 03/15/2021 01:14   DG ERCP BILIARY & PANCREATIC DUCTS  Result Date: 03/16/2021 CLINICAL DATA:  Severe acute gallstone pancreatitis EXAM: ERCP TECHNIQUE: Multiple spot images obtained with the fluoroscopic device and submitted for interpretation post-procedure.  FLUOROSCOPY TIME:  Fluoroscopy Time:  1 minutes 52 seconds Radiation Exposure Index (if provided by the fluoroscopic device): 59.54 mGy Number of Acquired Spot Images: 4 COMPARISON:  MRI abdomen 03/15/2021 FINDINGS: Submitted intraoperative fluoroscopic images demonstrate cannulation and opacification of the common bile duct. No filling defects identified within the common bile duct. Common bile duct appears dilated. No significant opacification of the pancreatic duct. IMPRESSION: ERCP as above. These images were submitted for radiologic interpretation only. Please see the procedural report for the amount of contrast and the fluoroscopy time utilized. Electronically Signed   By: Miachel Roux M.D.   On: 03/16/2021 07:42   MR ABDOMEN MRCP W WO CONTAST  Result Date: 03/15/2021 CLINICAL DATA:  Inpatient.  Severe acute gallstone pancreatitis. EXAM: MRI ABDOMEN WITHOUT AND WITH CONTRAST (INCLUDING MRCP) TECHNIQUE: Multiplanar multisequence MR imaging of the abdomen was performed both before and after the administration of intravenous contrast. Heavily T2-weighted images of the biliary and pancreatic ducts were obtained, and three-dimensional MRCP images were rendered by post processing. CONTRAST:  12mL GADAVIST GADOBUTROL 1 MMOL/ML IV SOLN COMPARISON:  03/15/2021 abdominal sonogram. FINDINGS: Lower chest: No acute abnormality at the lung bases. Mild cardiomegaly. Hepatobiliary: Normal liver size and configuration. No significant hepatic steatosis. No liver mass. Several layering gallstones in the mildly distended gallbladder, largest 9 mm. Mild diffuse gallbladder wall thickening. No significant pericholecystic fluid. Minimal central intrahepatic biliary ductal dilatation. Dilated common bile duct with diameter 10 mm. There is a 4 mm stone at the ampulla (series 3/image 36). Otherwise no stones in the bile ducts. No enhancing biliary masses, biliary strictures or beading of the bile ducts. Pancreas: Very  mild  peripancreatic edema in the pancreatic head with preserved pancreatic parenchymal enhancement, compatible with acute non necrotizing pancreatitis. No pancreatic mass or duct dilation. No pancreas divisum. Spleen: Mild-to-moderate splenomegaly (craniocaudal splenic length 15.1 cm. Two small cystic splenic lesions, largest 1.3 cm with thin internal septations and no wall thickening or solid enhancement, compatible with lymphangiomas. Adrenals/Urinary Tract: Normal adrenals. No hydronephrosis. Tiny simple parapelvic renal cysts, left greater than right. No suspicious renal masses. Stomach/Bowel: Normal non-distended stomach. Visualized small and large bowel is normal caliber, with no bowel wall thickening. Marked diffuse colonic diverticulosis. Vascular/Lymphatic: Atherosclerotic nonaneurysmal abdominal aorta. Patent portal, splenic, hepatic and renal veins. No pathologically enlarged lymph nodes in the abdomen. Other: No abdominal ascites or focal fluid collection. Musculoskeletal: No aggressive appearing focal osseous lesions. IMPRESSION: 1. Mild acute non-necrotizing pancreatitis in the pancreatic head. 2. Solitary 4 mm ampullary stone. Dilated (10 mm diameter) common bile duct. Minimal central intrahepatic biliary ductal dilatation. 3. Cholelithiasis. Mild diffuse gallbladder wall thickening. No significant pericholecystic fluid. 4. Mild-to-moderate splenomegaly. 5. Marked diffuse colonic diverticulosis. Electronically Signed   By: Ilona Sorrel M.D.   On: 03/15/2021 10:56    Anti-infectives: Anti-infectives (From admission, onward)   Start     Dose/Rate Route Frequency Ordered Stop   03/15/21 1200  piperacillin-tazobactam (ZOSYN) IVPB 3.375 g        3.375 g 12.5 mL/hr over 240 Minutes Intravenous Every 8 hours 03/15/21 0259     03/15/21 0600  piperacillin-tazobactam (ZOSYN) IVPB 3.375 g  Status:  Discontinued        3.375 g 12.5 mL/hr over 240 Minutes Intravenous Every 8 hours 03/15/21 0250 03/15/21 0259    03/15/21 0145  piperacillin-tazobactam (ZOSYN) IVPB 3.375 g        3.375 g 100 mL/hr over 30 Minutes Intravenous  Once 03/15/21 0137 03/15/21 0321       Assessment/Plan Jehovah's witness - does not accept blood products HTN DM  Gallstone pancreatitis -LFTs downtrending and lipase almost normal at 55 today -pain resolved -s/p ERCP yesterday -plan for lap chole today -I have explained the procedure, risks, and aftercare of cholecystectomy.  Risks include but are not limited to bleeding, infection, wound problems, anesthesia, diarrhea, bile leak, injury to common bile duct/liver/intestine.  She seems to understand and agrees to proceed. -on zosyn already, likely doesn't need this or at least anymore post op  FEN - NPO/IVFs VTE - none ID - [email or web address]   LOS: 1 day    Henreitta Cea , [facility] 03/16/2021, 9:34 AM Please see Amion for pager number during day hours 7:00am-4:30pm or 7:00am -11:30am on weekends

## 2021-03-16 NOTE — Discharge Instructions (Signed)
[facility]  Please arrive at least 30 min before your appointment to complete your check in paperwork.  If you are unable to arrive 30 min prior to your appointment time we may have to cancel or reschedule you. LAPAROSCOPIC SURGERY: POST OP INSTRUCTIONS Always review your discharge instruction sheet given to you by the facility where your surgery was performed. IF YOU HAVE DISABILITY OR FAMILY LEAVE FORMS, YOU MUST BRING THEM TO THE OFFICE FOR PROCESSING.   DO NOT GIVE THEM TO YOUR DOCTOR.  PAIN CONTROL  1. First take acetaminophen (Tylenol) AND/or ibuprofen (Advil) to control your pain after surgery.  Follow directions on package.  Taking acetaminophen (Tylenol) and/or ibuprofen (Advil) regularly after surgery will help to control your pain and lower the amount of prescription pain medication you may need.  You should not take more than 4,000 mg (4 grams) of acetaminophen (Tylenol) in 24 hours.  You should not take ibuprofen (Advil), aleve, motrin, naprosyn or other NSAIDS if you have a history of stomach ulcers or chronic kidney disease.  2. A prescription for pain medication may be given to you upon discharge.  Take your pain medication as prescribed, if you still have uncontrolled pain after taking acetaminophen (Tylenol) or ibuprofen (Advil). 3. Use ice packs to help control pain. 4. If you need a refill on your pain medication, please contact your pharmacy.  They will contact our office to request authorization. Prescriptions will not be filled after 5pm or on week-ends.  HOME MEDICATIONS 5. Take your usually prescribed medications unless otherwise directed.  DIET 6. You should follow a light diet the first few days after arrival home.  Be sure to include lots of fluids daily. Avoid fatty, fried foods.   CONSTIPATION 7. It is common to experience some constipation after surgery and if you are taking pain medication.  Increasing fluid intake and taking a stool  softener (such as Colace) will usually help or prevent this problem from occurring.  A mild laxative (Milk of Magnesia or Miralax) should be taken according to package instructions if there are no bowel movements after 48 hours.  WOUND/INCISION CARE 8. Most patients will experience some swelling and bruising in the area of the incisions.  Ice packs will help.  Swelling and bruising can take several days to resolve.  9. Unless discharge instructions indicate otherwise, follow guidelines below  a. STERI-STRIPS - you may remove your outer bandages 48 hours after surgery, and you may shower at that time.  You have steri-strips (small skin tapes) in place directly over the incision.  These strips should be left on the skin for 7-10 days.   b. DERMABOND/SKIN GLUE - you may shower in 24 hours.  The glue will flake off over the next 2-3 weeks. 10. Any sutures or staples will be removed at the office during your follow-up visit.  ACTIVITIES 11. You may resume regular (light) daily activities beginning the next day--such as daily self-care, walking, climbing stairs--gradually increasing activities as tolerated.  You may have sexual intercourse when it is comfortable.  Refrain from any heavy lifting or straining until approved by your doctor. a. You may drive when you are no longer taking prescription pain medication, you can comfortably wear a seatbelt, and you can safely maneuver your car and apply brakes.  FOLLOW-UP 12. You should see your doctor in the office for a follow-up appointment approximately 2-3 weeks after your surgery.  You should have been given your post-op/follow-up appointment when   your surgery was scheduled.  If you did not receive a post-op/follow-up appointment, make sure that you call for this appointment within a day or two after you arrive home to insure a convenient appointment time.   WHEN TO CALL YOUR DOCTOR: 1. Fever over 101.0 2. Inability to urinate 3. Continued bleeding from  incision. 4. Increased pain, redness, or drainage from the incision. 5. Increasing abdominal pain  The clinic staff is available to answer your questions during regular business hours.  Please don't hesitate to call and ask to speak to one of the nurses for clinical concerns.  If you have a medical emergency, go to the nearest emergency room or call 911.  A surgeon from [facility] is always on call at the hospital. [address], [address], [place], [place]  [address] ? [address] [address], , [address]   [address] [phone] ? [phone] ? FAX [phone]  .........   Managing Your Pain After Surgery Without Opioids    Thank you for participating in our program to help patients manage their pain after surgery without opioids. This is part of our effort to provide you with the best care possible, without exposing you or your family to the risk that opioids pose.  What pain can I expect after surgery? You can expect to have some pain after surgery. This is normal. The pain is typically worse the day after surgery, and quickly begins to get better. Many studies have found that many patients are able to manage their pain after surgery with Over-the-Counter (OTC) medications such as Tylenol and Motrin. If you have a condition that does not allow you to take Tylenol or Motrin, notify your surgical team.  How will I manage my pain? The best strategy for controlling your pain after surgery is around the clock pain control with Tylenol (acetaminophen) and Motrin (ibuprofen or Advil). Alternating these medications with each other allows you to maximize your pain control. In addition to Tylenol and Motrin, you can use heating pads or ice packs on your incisions to help reduce your pain.  How will I alternate your regular strength over-the-counter pain medication? You will take a dose of pain medication every three hours. ; Start by taking 650 mg of Tylenol (2 pills of 325  mg) ; 3 hours later take 600 mg of Motrin (3 pills of 200 mg) ; 3 hours after taking the Motrin take 650 mg of Tylenol ; 3 hours after that take 600 mg of Motrin.   - 1 -  See example - if your first dose of Tylenol is at 12:00 PM   12:00 PM Tylenol 650 mg (2 pills of 325 mg)  3:00 PM Motrin 600 mg (3 pills of 200 mg)  6:00 PM Tylenol 650 mg (2 pills of 325 mg)  9:00 PM Motrin 600 mg (3 pills of 200 mg)  Continue alternating every 3 hours   We recommend that you follow this schedule around-the-clock for at least 3 days after surgery, or until you feel that it is no longer needed. Use the table on the last page of this handout to keep track of the medications you are taking. Important: Do not take more than 3000mg of Tylenol or 3200mg of Motrin in a 24-hour period. Do not take ibuprofen/Motrin if you have a history of bleeding stomach ulcers, severe kidney disease, &/or actively taking a blood thinner  What if I still have pain? If you have pain that is not   controlled with the over-the-counter pain medications (Tylenol and Motrin or Advil) you might have what we call "breakthrough" pain. You will receive a prescription for a small amount of an opioid pain medication such as Oxycodone, Tramadol, or Tylenol with Codeine. Use these opioid pills in the first 24 hours after surgery if you have breakthrough pain. Do not take more than 1 pill every 4-6 hours.  If you still have uncontrolled pain after using all opioid pills, don't hesitate to call our staff using the number provided. We will help make sure you are managing your pain in the best way possible, and if necessary, we can provide a prescription for additional pain medication.   Day 1    Time  Name of Medication Number of pills taken  Amount of Acetaminophen  Pain Level   Comments  AM PM       AM PM       AM PM       AM PM       AM PM       AM PM       AM PM       AM PM       Total Daily amount of Acetaminophen Do not  take more than  3,000 mg per day      Day 2    Time  Name of Medication Number of pills taken  Amount of Acetaminophen  Pain Level   Comments  AM PM       AM PM       AM PM       AM PM       AM PM       AM PM       AM PM       AM PM       Total Daily amount of Acetaminophen Do not take more than  3,000 mg per day      Day 3    Time  Name of Medication Number of pills taken  Amount of Acetaminophen  Pain Level   Comments  AM PM       AM PM       AM PM       AM PM          AM PM       AM PM       AM PM       AM PM       Total Daily amount of Acetaminophen Do not take more than  3,000 mg per day      Day 4    Time  Name of Medication Number of pills taken  Amount of Acetaminophen  Pain Level   Comments  AM PM       AM PM       AM PM       AM PM       AM PM       AM PM       AM PM       AM PM       Total Daily amount of Acetaminophen Do not take more than  3,000 mg per day      Day 5    Time  Name of Medication Number of pills taken  Amount of Acetaminophen  Pain Level   Comments  AM PM       AM PM       AM   PM       AM PM       AM PM       AM PM       AM PM       AM PM       Total Daily amount of Acetaminophen Do not take more than  3,000 mg per day       Day 6    Time  Name of Medication Number of pills taken  Amount of Acetaminophen  Pain Level  Comments  AM PM       AM PM       AM PM       AM PM       AM PM       AM PM       AM PM       AM PM       Total Daily amount of Acetaminophen Do not take more than  3,000 mg per day      Day 7    Time  Name of Medication Number of pills taken  Amount of Acetaminophen  Pain Level   Comments  AM PM       AM PM       AM PM       AM PM       AM PM       AM PM       AM PM       AM PM       Total Daily amount of Acetaminophen Do not take more than  3,000 mg per day        For additional information about how and where to safely dispose of unused  opioid medications - https://www.morepowerfulnc.org  Disclaimer: This document contains information and/or instructional materials adapted from [facility] for the typical patient with your condition. It does not replace medical advice from your health care provider because your experience may differ from that of the typical patient. Talk to your health care provider if you have any questions about this document, your condition or your treatment plan. Adapted from [facility]   

## 2021-03-17 ENCOUNTER — Encounter (HOSPITAL_COMMUNITY): Payer: Self-pay | Admitting: General Surgery

## 2021-03-17 DIAGNOSIS — K851 Biliary acute pancreatitis without necrosis or infection: Secondary | ICD-10-CM | POA: Diagnosis not present

## 2021-03-17 LAB — CBC
HCT: 38.9 % (ref 36.0–46.0)
Hemoglobin: 12 g/dL (ref 12.0–15.0)
MCH: 32.8 pg (ref 26.0–34.0)
MCHC: 30.8 g/dL (ref 30.0–36.0)
MCV: 106.3 fL — ABNORMAL HIGH (ref 80.0–100.0)
Platelets: 161 10*3/uL (ref 150–400)
RBC: 3.66 MIL/uL — ABNORMAL LOW (ref 3.87–5.11)
RDW: 14.6 % (ref 11.5–15.5)
WBC: 7.9 10*3/uL (ref 4.0–10.5)
nRBC: 0 % (ref 0.0–0.2)

## 2021-03-17 LAB — COMPREHENSIVE METABOLIC PANEL
ALT: 99 U/L — ABNORMAL HIGH (ref 0–44)
AST: 92 U/L — ABNORMAL HIGH (ref 15–41)
Albumin: 3.2 g/dL — ABNORMAL LOW (ref 3.5–5.0)
Alkaline Phosphatase: 183 U/L — ABNORMAL HIGH (ref 38–126)
Anion gap: 8 (ref 5–15)
BUN: 15 mg/dL (ref 8–23)
CO2: 26 mmol/L (ref 22–32)
Calcium: 8.9 mg/dL (ref 8.9–10.3)
Chloride: 103 mmol/L (ref 98–111)
Creatinine, Ser: 0.91 mg/dL (ref 0.44–1.00)
GFR, Estimated: >60 mL/min (ref >60)
Glucose, Bld: 161 mg/dL — ABNORMAL HIGH (ref 70–99)
Potassium: 4.3 mmol/L (ref 3.5–5.1)
Sodium: 137 mmol/L (ref 135–145)
Total Bilirubin: 4 mg/dL — ABNORMAL HIGH (ref 0.3–1.2)
Total Protein: 6.6 g/dL (ref 6.5–8.1)

## 2021-03-17 LAB — GLUCOSE, CAPILLARY
Glucose-Capillary: 124 mg/dL — ABNORMAL HIGH (ref 70–99)
Glucose-Capillary: 142 mg/dL — ABNORMAL HIGH (ref 70–99)

## 2021-03-17 MED ORDER — OXYCODONE HCL 5 MG PO TABS: 5.0000 mg | ORAL_TABLET | ORAL | 0 refills | Status: DC | PRN

## 2021-03-17 MED ORDER — ACETAMINOPHEN 325 MG PO TABS: 650.0000 mg | ORAL_TABLET | Freq: Four times a day (QID) | ORAL | Status: DC | PRN

## 2021-03-17 NOTE — Progress Notes (Signed)
1 Day Post-Op  Subjective: Pain well controlled.  Eating well. Has mobilized in her room well.    ROS: See above, otherwise other systems negative  Objective: Vital signs in last 24 hours: Temp:  [97.4 F (36.3 C)-98.6 F (37 C)] 98.6 F (37 C) (05/10 0555) Pulse Rate:  [53-81] 80 (05/10 0555) Resp:  [13-20] 20 (05/10 0555) BP: (117-146)/(58-77) 129/69 (05/10 0555) SpO2:  [91 %-100 %] 98 % (05/10 0616) Weight:  [108.9 kg] 108.9 kg (05/09 1313) Last BM Date: 03/16/21  Intake/Output from previous day: 05/09 0701 - 05/10 0700 In: 2358.8 [P.O.:360; I.V.:1948.8; IV Piggyback:50] Out: 100 [Blood:100] Intake/Output this shift: No intake/output data recorded.  PE: Gen: NAD Heart: regular Lungs: CTAB Abd: obese, +BS, tender as expected.  Incisions c/d/i  Lab Results:  Recent Labs    03/16/21 0832 03/17/21 0358  WBC 2.1* 7.9  HGB 11.7* 12.0  HCT 36.4 38.9  PLT 157 161   BMET Recent Labs    03/16/21 0832 03/17/21 0358  NA 137 137  K 3.6 4.3  CL 104 103  CO2 23 26  GLUCOSE 168* 161*  BUN 11 15  CREATININE 0.76 0.91  CALCIUM 8.6* 8.9   PT/INR Recent Labs    03/15/21 0734  LABPROT 13.8  INR 1.1   CMP     Component Value Date/Time   NA 137 03/17/2021 0358   K 4.3 03/17/2021 0358   CL 103 03/17/2021 0358   CO2 26 03/17/2021 0358   GLUCOSE 161 (H) 03/17/2021 0358   BUN 15 03/17/2021 0358   CREATININE 0.91 03/17/2021 0358   CALCIUM 8.9 03/17/2021 0358   PROT 6.6 03/17/2021 0358   ALBUMIN 3.2 (L) 03/17/2021 0358   AST 92 (H) 03/17/2021 0358   ALT 99 (H) 03/17/2021 0358   ALKPHOS 183 (H) 03/17/2021 0358   BILITOT 4.0 (H) 03/17/2021 0358   GFRNONAA >60 03/17/2021 0358   GFRAA >60 10/30/2019 0412   Lipase     Component Value Date/Time   LIPASE 55 (H) 03/16/2021 0832       Studies/Results: MR 3D Recon At Scanner  Result Date: 03/16/2021 CLINICAL DATA:  Inpatient.  Severe acute gallstone pancreatitis. EXAM: MRI ABDOMEN WITHOUT AND WITH  CONTRAST (INCLUDING MRCP) TECHNIQUE: Multiplanar multisequence MR imaging of the abdomen was performed both before and after the administration of intravenous contrast. Heavily T2-weighted images of the biliary and pancreatic ducts were obtained, and three-dimensional MRCP images were rendered by post processing. CONTRAST:  13mL GADAVIST GADOBUTROL 1 MMOL/ML IV SOLN COMPARISON:  03/15/2021 abdominal sonogram. FINDINGS: Lower chest: No acute abnormality at the lung bases. Mild cardiomegaly. Hepatobiliary: Normal liver size and configuration. No significant hepatic steatosis. No liver mass. Several layering gallstones in the mildly distended gallbladder, largest 9 mm. Mild diffuse gallbladder wall thickening. No significant pericholecystic fluid. Minimal central intrahepatic biliary ductal dilatation. Dilated common bile duct with diameter 10 mm. There is a 4 mm stone at the ampulla (series 3/image 36). Otherwise no stones in the bile ducts. No enhancing biliary masses, biliary strictures or beading of the bile ducts. Pancreas: Very mild peripancreatic edema in the pancreatic head with preserved pancreatic parenchymal enhancement, compatible with acute non necrotizing pancreatitis. No pancreatic mass or duct dilation. No pancreas divisum. Spleen: Mild-to-moderate splenomegaly (craniocaudal splenic length 15.1 cm. Two small cystic splenic lesions, largest 1.3 cm with thin internal septations and no wall thickening or solid enhancement, compatible with lymphangiomas. Adrenals/Urinary Tract: Normal adrenals. No hydronephrosis. Tiny simple parapelvic renal cysts,  left greater than right. No suspicious renal masses. Stomach/Bowel: Normal non-distended stomach. Visualized small and large bowel is normal caliber, with no bowel wall thickening. Marked diffuse colonic diverticulosis. Vascular/Lymphatic: Atherosclerotic nonaneurysmal abdominal aorta. Patent portal, splenic, hepatic and renal veins. No pathologically enlarged  lymph nodes in the abdomen. Other: No abdominal ascites or focal fluid collection. Musculoskeletal: No aggressive appearing focal osseous lesions. IMPRESSION: 1. Mild acute non-necrotizing pancreatitis in the pancreatic head. 2. Solitary 4 mm ampullary stone. Dilated (10 mm diameter) common bile duct. Minimal central intrahepatic biliary ductal dilatation. 3. Cholelithiasis. Mild diffuse gallbladder wall thickening. No significant pericholecystic fluid. 4. Mild-to-moderate splenomegaly. 5. Marked diffuse colonic diverticulosis. Electronically Signed   By: Ilona Sorrel M.D.   On: 03/15/2021 10:56   DG ERCP BILIARY & PANCREATIC DUCTS  Result Date: 03/16/2021 CLINICAL DATA:  Severe acute gallstone pancreatitis EXAM: ERCP TECHNIQUE: Multiple spot images obtained with the fluoroscopic device and submitted for interpretation post-procedure. FLUOROSCOPY TIME:  Fluoroscopy Time:  1 minutes 52 seconds Radiation Exposure Index (if provided by the fluoroscopic device): 59.54 mGy Number of Acquired Spot Images: 4 COMPARISON:  MRI abdomen 03/15/2021 FINDINGS: Submitted intraoperative fluoroscopic images demonstrate cannulation and opacification of the common bile duct. No filling defects identified within the common bile duct. Common bile duct appears dilated. No significant opacification of the pancreatic duct. IMPRESSION: ERCP as above. These images were submitted for radiologic interpretation only. Please see the procedural report for the amount of contrast and the fluoroscopy time utilized. Electronically Signed   By: Miachel Roux M.D.   On: 03/16/2021 07:42   MR ABDOMEN MRCP W WO CONTAST  Result Date: 03/15/2021 CLINICAL DATA:  Inpatient.  Severe acute gallstone pancreatitis. EXAM: MRI ABDOMEN WITHOUT AND WITH CONTRAST (INCLUDING MRCP) TECHNIQUE: Multiplanar multisequence MR imaging of the abdomen was performed both before and after the administration of intravenous contrast. Heavily T2-weighted images of the biliary  and pancreatic ducts were obtained, and three-dimensional MRCP images were rendered by post processing. CONTRAST:  10mL GADAVIST GADOBUTROL 1 MMOL/ML IV SOLN COMPARISON:  03/15/2021 abdominal sonogram. FINDINGS: Lower chest: No acute abnormality at the lung bases. Mild cardiomegaly. Hepatobiliary: Normal liver size and configuration. No significant hepatic steatosis. No liver mass. Several layering gallstones in the mildly distended gallbladder, largest 9 mm. Mild diffuse gallbladder wall thickening. No significant pericholecystic fluid. Minimal central intrahepatic biliary ductal dilatation. Dilated common bile duct with diameter 10 mm. There is a 4 mm stone at the ampulla (series 3/image 36). Otherwise no stones in the bile ducts. No enhancing biliary masses, biliary strictures or beading of the bile ducts. Pancreas: Very mild peripancreatic edema in the pancreatic head with preserved pancreatic parenchymal enhancement, compatible with acute non necrotizing pancreatitis. No pancreatic mass or duct dilation. No pancreas divisum. Spleen: Mild-to-moderate splenomegaly (craniocaudal splenic length 15.1 cm. Two small cystic splenic lesions, largest 1.3 cm with thin internal septations and no wall thickening or solid enhancement, compatible with lymphangiomas. Adrenals/Urinary Tract: Normal adrenals. No hydronephrosis. Tiny simple parapelvic renal cysts, left greater than right. No suspicious renal masses. Stomach/Bowel: Normal non-distended stomach. Visualized small and large bowel is normal caliber, with no bowel wall thickening. Marked diffuse colonic diverticulosis. Vascular/Lymphatic: Atherosclerotic nonaneurysmal abdominal aorta. Patent portal, splenic, hepatic and renal veins. No pathologically enlarged lymph nodes in the abdomen. Other: No abdominal ascites or focal fluid collection. Musculoskeletal: No aggressive appearing focal osseous lesions. IMPRESSION: 1. Mild acute non-necrotizing pancreatitis in the  pancreatic head. 2. Solitary 4 mm ampullary stone. Dilated (10 mm diameter) common  bile duct. Minimal central intrahepatic biliary ductal dilatation. 3. Cholelithiasis. Mild diffuse gallbladder wall thickening. No significant pericholecystic fluid. 4. Mild-to-moderate splenomegaly. 5. Marked diffuse colonic diverticulosis. Electronically Signed   By: Ilona Sorrel M.D.   On: 03/15/2021 10:56    Anti-infectives: Anti-infectives (From admission, onward)   Start     Dose/Rate Route Frequency Ordered Stop   03/15/21 1200  piperacillin-tazobactam (ZOSYN) IVPB 3.375 g  Status:  Discontinued        3.375 g 12.5 mL/hr over 240 Minutes Intravenous Every 8 hours 03/15/21 0259 03/16/21 1633   03/15/21 0600  piperacillin-tazobactam (ZOSYN) IVPB 3.375 g  Status:  Discontinued        3.375 g 12.5 mL/hr over 240 Minutes Intravenous Every 8 hours 03/15/21 0250 03/15/21 0259   03/15/21 0145  piperacillin-tazobactam (ZOSYN) IVPB 3.375 g        3.375 g 100 mL/hr over 30 Minutes Intravenous  Once 03/15/21 0137 03/15/21 0321       Assessment/Plan Je[employer]'s witness - does not accept blood products HTN DM  POD 1, s/p lap chole by Dr. Barry Dienes 03/16/21 for Gallstone pancreatitis -LFTs downtrending.  Will need outpatient follow up with GI to continue to follow her labs -patient was noted to have a very friable liver concerning for cirrhosis, GI follow up as well.   -surgically stable today -ok for DC home from our standpoint -follow up and Rx has been sent in for her.  FEN - regular VTE - on hold due to blood loss in case and friable liver ID - zosyn DC 5/9   LOS: 2 days    Henreitta Cea , [facility] 03/17/2021, 9:09 AM Please see Amion for pager number during day hours 7:00am-4:30pm or 7:00am -11:30am on weekends

## 2021-03-17 NOTE — Plan of Care (Signed)
  Problem: Nutrition: Goal: Adequate nutrition will be maintained Outcome: Progressing   Problem: Pain Managment: Goal: General experience of comfort will improve Outcome: Progressing   Problem: Safety: Goal: Ability to remain free from injury will improve Outcome: Progressing   

## 2021-03-18 LAB — SURGICAL PATHOLOGY

## 2021-03-18 NOTE — Discharge Summary (Signed)
[facility] Discharge Summary   Patient: Kathleen Wiley URK:[id number]  PCP: Kathleen Jordan, MD  Date of admission: 03/14/2021   Date of discharge: 03/17/2021      Discharge Diagnoses:  Principal Problem:   Gallstone pancreatitis Active Problems:   Essential hypertension, benign   Hypokalemia   Acute cholecystitis   Controlled type 2 diabetes mellitus with neuropathy ([place])   Obesity with body mass index (BMI) of 30.0 to 39.9   Acute gallstone pancreatitis  Admitted From: home Disposition:  Home   Recommendations for Outpatient Follow-up:  1. PCP: follow up in 1 week follow up with General surgery as recommended 2. Follow up LABS/TEST:  none 3. New Meds: tylenol, and oxycodone 4. Changed meds: none 5. Stopped meds: none   Follow-up Information    [facility], [place] Follow up on 04/07/2021.   Specialty: General Surgery Why: 8:45am, arrive by 8:15am for paperwork and check in process Contact information: [address] [place] [address] [phone]        Kathleen Juniper, MD. Schedule an appointment as soon as possible for a visit.   Specialty: Gastroenterology Why: Please schedule follow up appointment to discuss colonoscopy. Contact information: [place] [address] [phone]        Kathleen Jordan, MD. Schedule an appointment as soon as possible for a visit in 1 week(s).   Specialty: Family Medicine Contact information: [address] [place] [address] [phone]              Discharge Instructions    Diet - low sodium heart healthy   Complete by: As directed    Increase activity slowly   Complete by: As directed    No wound care   Complete by: As directed       Diet recommendation: Cardiac diet  Activity: The patient is advised to gradually reintroduce usual activities, as tolerated  Discharge Condition: stable  Code Status: Full code   History of present illness:  As per the H and P dictated on admission, "Kathleen Wiley is a 75 y.o. female with medical history significant for DMT2, HTN, OA, obesity who presents with a 4-day history of abdominal pain that is in the upper center and the right upper quadrant of her abdomen.  Occasionally the pain will radiate around into her back.  She has had nausea with the abdominal pain but has not had any vomiting or diarrhea.  She states when she eats that intensifies and aggravates the pain.  She has not found any alleviating factors.  She has not had any fevers or chills.  She denies any cough, shortness of breath, chest pain or palpitations.  Not had any urinary frequency or dysuria.  She was seen by her PCP today and had lab work done and was called and told her LFTs were elevated.  Was having worsening pain and continued nausea she came to the hospital for evaluation.  She reports she is never had pain like this in the past.  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  She reports she has been having bowel movements regularly and they appear a little lighter in color but there is not been any blood or mucus.  She has not had any abdominal injury or trauma. Lives with her husband.  Denies tobacco, alcohol, illicit drug use  ED Course:  [name] has been hemodynamically stable  in the emergency room.  CBC shows WBC of 3.9, hemoglobin 12.7, hematocrit 39.2 and platelets of 201.  Potassium is mildly decreased at 3.4 and other electrolytes are normal.  Creatinine is 0.76 with a BUN of 12.  Lipase is 3782.  AST is 115 ALT 112 and alkaline phosphatase 206.  Total bilirubin is 6.0.  Gallbladder ultrasound shows cholelithiasis with gallbladder sludge and acute cholecystitis.  Common bile duct is mildly dilated at 9 mm.  Hepatic steatosis present.  ER physician discussed with gastroenterology recommended MRCP in the morning for further evaluation to make sure there is no choledocholithiasis or  retained stone.  GI will consult and see patient in the morning.  Surgery will need to be consulted in the morning for evaluation once it is determined if patient will need ERCP if retained stone is present.  On Zosyn for antibiotic coverage empirically.  Pain control provided.  Hospitalist service is asked to admit for further management"  Hospital Course:  Summary of her active problems in the hospital is as following.   1.  Acute cholecystitis. Gallstone pancreatitis. Choledocholithiasis Presents with complaints of abdominal pain. Elevated LFT. Lipase was also 8000. Ultrasound shows evidence of cholecystitis with CBD stone. Underwent MRCP which confirmed the diagnosis for CBD stone. GI was consulted. Underwent ERCP on 5/8.  Stones were removed. General surgery was consulted Underwent lap chole on 5/9.  Tolerated procedure very well. Diet management and pain management per surgery. Appreciate GI and general surgery assistance in managing this patient. Was on antibiotics.   2.  HTN Blood pressure stable. Monitor.  3.  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 Resume home regimen  4.  Hypokalemia Resolved.  Monitor.  5.  Obesity Placing the patient at high risk for poor outcome. Likely contributing to gallbladder stone.  Body mass index is 39.95 kg/m.   Pain control  - 5 day supply was provided. - Patient was instructed, not to drive, operate heavy machinery, perform activities at heights, swimming or participation in water activities or provide baby sitting services while on Pain, Sleep and Anxiety Medications; until her outpatient Physician has advised to do so again.  - Also recommended to not to take more than prescribed Pain, Sleep and Anxiety Medications.  Patient was ambulatory without any assistance. On the day of the discharge the patient's vitals were stable, and no other acute medical  condition were reported by patient. The patient was felt safe to be discharge at Home with no therapy needed on discharge.  Consultants: Gastroenterology General surgery  Procedures: ERCP Lap cholecystectomy    DISCHARGE MEDICATION: Allergies as of 03/17/2021      Reactions   Other    Refuses whole blood but does accept albumin   Metronidazole Other (See Comments)   Chest pain, A-fib   Septra [sulfamethoxazole-trimethoprim] Other (See Comments)   Blisters      Medication List    STOP taking these medications   dexamethasone 6 MG tablet Commonly known as: DECADRON     TAKE these medications   acetaminophen 325 MG tablet Commonly known as: TYLENOL Take 2 tablets (650 mg total) by mouth every 6 (six) hours as needed.   ascorbic acid 500 MG tablet Commonly known as: VITAMIN C Take 1 tablet (500 mg total) by mouth daily.   atenolol-chlorthalidone 50-25 MG tablet Commonly known as: TENORETIC Take 1 tablet by mouth daily.   cyanocobalamin 1000 MCG tablet Take 1,000 mcg  by mouth daily.   escitalopram 10 MG tablet Commonly known as: LEXAPRO Take 10 mg by mouth daily.   fluticasone 50 MCG/ACT nasal spray Commonly known as: FLONASE Place 2 sprays into both nostrils daily as needed for allergies.   furosemide 20 MG tablet Commonly known as: LASIX Take 20 mg by mouth 3 (three) times a week.   gabapentin 300 MG capsule Commonly known as: NEURONTIN Take 300 mg by mouth 2 (two) times daily.   gemfibrozil 600 MG tablet Commonly known as: LOPID Take 600 mg by mouth 2 (two) times daily before a meal.   Insulin Pen Needle 32G X 6 MM Misc Inject into the skin daily.   naproxen sodium 220 MG tablet Commonly known as: ALEVE 1 tablet with food or milk as needed   omeprazole 20 MG capsule Commonly known as: PRILOSEC Take 20 mg by mouth daily.   ondansetron 4 MG tablet Commonly known as: ZOFRAN Take 4 mg by mouth every 8 (eight) hours as needed.   oxyCODONE 5 MG  immediate release tablet Commonly known as: Oxy IR/ROXICODONE Take 1 tablet (5 mg total) by mouth every 4 (four) hours as needed for moderate pain.   Oxytrol For Women 3.9 MG/24HR Generic drug: oxybutynin Place 1 patch onto the skin every 3 (three) days.   Ozempic (0.25 or 0.5 MG/DOSE) 2 MG/1.5ML Sopn Generic drug: Semaglutide(0.25 or 0.5MG /DOS) Inject 0.5 mg into the skin once a week.   Potassium 99 MG Tabs Take 99 mg by mouth daily.   Vitamin D 50 MCG (2000 UT) tablet Take 2,000 Units by mouth daily.       Discharge Exam: Filed Weights   03/15/21 0448 03/15/21 1443 03/16/21 1313  Weight: 108 kg 108.9 kg 108.9 kg   Vitals:   03/17/21 0555 03/17/21 0616  BP: 129/69   Pulse: 80   Resp: 20   Temp: 98.6 F (37 C)   SpO2: 98% 98%   General: Appear in mild distress, no Rash; Oral Mucosa Clear, moist. no Abnormal Neck Mass Or lumps, Conjunctiva normal  Cardiovascular: S1 and S2 Present, no Murmur, Respiratory: good respiratory effort, Bilateral Air entry present and CTA, no Crackles, no wheezes Abdomen: Bowel Sound present, Soft and mild tenderness Extremities: no Pedal edema Neurology: alert and oriented to time, place, and person affect appropriate. no new focal deficit Gait not checked due to patient safety concerns  The results of significant diagnostics from this hospitalization (including imaging, microbiology, ancillary and laboratory) are listed below for reference.    Significant Diagnostic Studies: MR 3D Recon At Scanner  Result Date: 03/16/2021 CLINICAL DATA:  Inpatient.  Severe acute gallstone pancreatitis. EXAM: MRI ABDOMEN WITHOUT AND WITH CONTRAST (INCLUDING MRCP) TECHNIQUE: Multiplanar multisequence MR imaging of the abdomen was performed both before and after the administration of intravenous contrast. Heavily T2-weighted images of the biliary and pancreatic ducts were obtained, and three-dimensional MRCP images were rendered by post processing. CONTRAST:   5mL GADAVIST GADOBUTROL 1 MMOL/ML IV SOLN COMPARISON:  03/15/2021 abdominal sonogram. FINDINGS: Lower chest: No acute abnormality at the lung bases. Mild cardiomegaly. Hepatobiliary: Normal liver size and configuration. No significant hepatic steatosis. No liver mass. Several layering gallstones in the mildly distended gallbladder, largest 9 mm. Mild diffuse gallbladder wall thickening. No significant pericholecystic fluid. Minimal central intrahepatic biliary ductal dilatation. Dilated common bile duct with diameter 10 mm. There is a 4 mm stone at the ampulla (series 3/image 36). Otherwise no stones in the bile ducts. No enhancing biliary masses,  biliary strictures or beading of the bile ducts. Pancreas: Very mild peripancreatic edema in the pancreatic head with preserved pancreatic parenchymal enhancement, compatible with acute non necrotizing pancreatitis. No pancreatic mass or duct dilation. No pancreas divisum. Spleen: Mild-to-moderate splenomegaly (craniocaudal splenic length 15.1 cm. Two small cystic splenic lesions, largest 1.3 cm with thin internal septations and no wall thickening or solid enhancement, compatible with lymphangiomas. Adrenals/Urinary Tract: Normal adrenals. No hydronephrosis. Tiny simple parapelvic renal cysts, left greater than right. No suspicious renal masses. Stomach/Bowel: Normal non-distended stomach. Visualized small and large bowel is normal caliber, with no bowel wall thickening. Marked diffuse colonic diverticulosis. Vascular/Lymphatic: Atherosclerotic nonaneurysmal abdominal aorta. Patent portal, splenic, hepatic and renal veins. No pathologically enlarged lymph nodes in the abdomen. Other: No abdominal ascites or focal fluid collection. Musculoskeletal: No aggressive appearing focal osseous lesions. IMPRESSION: 1. Mild acute non-necrotizing pancreatitis in the pancreatic head. 2. Solitary 4 mm ampullary stone. Dilated (10 mm diameter) common bile duct. Minimal central  intrahepatic biliary ductal dilatation. 3. Cholelithiasis. Mild diffuse gallbladder wall thickening. No significant pericholecystic fluid. 4. Mild-to-moderate splenomegaly. 5. Marked diffuse colonic diverticulosis. Electronically Signed   By: Ilona Sorrel M.D.   On: 03/15/2021 10:56   US Abdomen Limited  Result Date: 03/15/2021 CLINICAL DATA:  Right upper quadrant pain. EXAM: ULTRASOUND ABDOMEN LIMITED RIGHT UPPER QUADRANT COMPARISON:  Ultrasound abdomen 10/26/2019 FINDINGS: Gallbladder: Calcified gallstones and gallbladder sludge within the gallbladder lumen. Thickened gallbladder wall with associated pericholecystic fluid. A positive sonographic Murphy sign noted by sonographer. Common bile duct: Diameter: 9 mm. Liver: No focal lesion identified. Increased parenchymal echogenicity. Portal vein is patent on color Doppler imaging with normal direction of blood flow towards the liver. Other: None. IMPRESSION: 1. Cholelithiasis and gallbladder sludge with associated acute cholecystitis. 2. Choledocholithiasis not excluded in the setting of borderline enlarged common bile duct (9 mm). 3. Hepatic steatosis. Please note limited evaluation for focal hepatic masses in a patient with hepatic steatosis due to decreased penetration of the acoustic ultrasound waves. Electronically Signed   By: Iven Finn M.D.   On: 03/15/2021 01:14   DG ERCP BILIARY & PANCREATIC DUCTS  Result Date: 03/16/2021 CLINICAL DATA:  Severe acute gallstone pancreatitis EXAM: ERCP TECHNIQUE: Multiple spot images obtained with the fluoroscopic device and submitted for interpretation post-procedure. FLUOROSCOPY TIME:  Fluoroscopy Time:  1 minutes 52 seconds Radiation Exposure Index (if provided by the fluoroscopic device): 59.54 mGy Number of Acquired Spot Images: 4 COMPARISON:  MRI abdomen 03/15/2021 FINDINGS: Submitted intraoperative fluoroscopic images demonstrate cannulation and opacification of the common bile duct. No filling defects  identified within the common bile duct. Common bile duct appears dilated. No significant opacification of the pancreatic duct. IMPRESSION: ERCP as above. These images were submitted for radiologic interpretation only. Please see the procedural report for the amount of contrast and the fluoroscopy time utilized. Electronically Signed   By: Miachel Roux M.D.   On: 03/16/2021 07:42   MR ABDOMEN MRCP W WO CONTAST  Result Date: 03/15/2021 CLINICAL DATA:  Inpatient.  Severe acute gallstone pancreatitis. EXAM: MRI ABDOMEN WITHOUT AND WITH CONTRAST (INCLUDING MRCP) TECHNIQUE: Multiplanar multisequence MR imaging of the abdomen was performed both before and after the administration of intravenous contrast. Heavily T2-weighted images of the biliary and pancreatic ducts were obtained, and three-dimensional MRCP images were rendered by post processing. CONTRAST:  65mL GADAVIST GADOBUTROL 1 MMOL/ML IV SOLN COMPARISON:  03/15/2021 abdominal sonogram. FINDINGS: Lower chest: No acute abnormality at the lung bases. Mild cardiomegaly. Hepatobiliary: Normal liver size  and configuration. No significant hepatic steatosis. No liver mass. Several layering gallstones in the mildly distended gallbladder, largest 9 mm. Mild diffuse gallbladder wall thickening. No significant pericholecystic fluid. Minimal central intrahepatic biliary ductal dilatation. Dilated common bile duct with diameter 10 mm. There is a 4 mm stone at the ampulla (series 3/image 36). Otherwise no stones in the bile ducts. No enhancing biliary masses, biliary strictures or beading of the bile ducts. Pancreas: Very mild peripancreatic edema in the pancreatic head with preserved pancreatic parenchymal enhancement, compatible with acute non necrotizing pancreatitis. No pancreatic mass or duct dilation. No pancreas divisum. Spleen: Mild-to-moderate splenomegaly (craniocaudal splenic length 15.1 cm. Two small cystic splenic lesions, largest 1.3 cm with thin internal  septations and no wall thickening or solid enhancement, compatible with lymphangiomas. Adrenals/Urinary Tract: Normal adrenals. No hydronephrosis. Tiny simple parapelvic renal cysts, left greater than right. No suspicious renal masses. Stomach/Bowel: Normal non-distended stomach. Visualized small and large bowel is normal caliber, with no bowel wall thickening. Marked diffuse colonic diverticulosis. Vascular/Lymphatic: Atherosclerotic nonaneurysmal abdominal aorta. Patent portal, splenic, hepatic and renal veins. No pathologically enlarged lymph nodes in the abdomen. Other: No abdominal ascites or focal fluid collection. Musculoskeletal: No aggressive appearing focal osseous lesions. IMPRESSION: 1. Mild acute non-necrotizing pancreatitis in the pancreatic head. 2. Solitary 4 mm ampullary stone. Dilated (10 mm diameter) common bile duct. Minimal central intrahepatic biliary ductal dilatation. 3. Cholelithiasis. Mild diffuse gallbladder wall thickening. No significant pericholecystic fluid. 4. Mild-to-moderate splenomegaly. 5. Marked diffuse colonic diverticulosis. Electronically Signed   By: Ilona Sorrel M.D.   On: 03/15/2021 10:56    Microbiology: Recent Results (from the past 240 hour(s))  Resp Panel by RT-PCR (Flu A&B, Covid) Nasopharyngeal Swab     Status: None   Collection Time: 03/15/21  1:51 AM   Specimen: Nasopharyngeal Swab; Nasopharyngeal(NP) swabs in vial transport medium  Result Value Ref Range Status   SARS Coronavirus 2 by RT PCR NEGATIVE NEGATIVE Final    Comment: (NOTE) SARS-CoV-2 target nucleic acids are NOT DETECTED.  The SARS-CoV-2 RNA is generally detectable in upper respiratory specimens during the acute phase of infection. The lowest concentration of SARS-CoV-2 viral copies this assay can detect is 138 copies/mL. A negative result does not preclude SARS-Cov-2 infection and should not be used as the sole basis for treatment or other patient management decisions. A negative  result may occur with  improper specimen collection/handling, submission of specimen other than nasopharyngeal swab, presence of viral mutation(s) within the areas targeted by this assay, and inadequate number of viral copies(<138 copies/mL). A negative result must be combined with clinical observations, patient history, and epidemiological information. The expected result is Negative.  Fact Sheet for Patients:  EntrepreneurPulse.com.au  Fact Sheet for Healthcare Providers:  IncredibleEmployment.be  This test is no t yet approved or cleared by the [place] FDA and  has been authorized for detection and/or diagnosis of SARS-CoV-2 by FDA under an Emergency Use Authorization (EUA). This EUA will remain  in effect (meaning this test can be used) for the duration of the COVID-19 declaration under Section 564(b)(1) of the Act, 21 U.S.C.section 360bbb-3(b)(1), unless the authorization is terminated  or revoked sooner.       Influenza A by PCR NEGATIVE NEGATIVE Final   Influenza B by PCR NEGATIVE NEGATIVE Final    Comment: (NOTE) The Xpert Xpress SARS-CoV-2/FLU/RSV plus assay is intended as an aid in the diagnosis of influenza from Nasopharyngeal swab specimens and should not be used as a sole basis for  treatment. Nasal washings and aspirates are unacceptable for Xpert Xpress SARS-CoV-2/FLU/RSV testing.  Fact Sheet for Patients: EntrepreneurPulse.com.au  Fact Sheet for Healthcare Providers: IncredibleEmployment.be  This test is not yet approved or cleared by the [place] FDA and has been authorized for detection and/or diagnosis of SARS-CoV-2 by FDA under an Emergency Use Authorization (EUA). This EUA will remain in effect (meaning this test can be used) for the duration of the COVID-19 declaration under Section 564(b)(1) of the Act, 21 U.S.C. section 360bbb-3(b)(1), unless the authorization is  terminated or revoked.  Performed at [facility], [place] [address], [place], [place] [address]   Surgical pcr screen     Status: None   Collection Time: 03/16/21 11:50 AM   Specimen: Nasal Mucosa; Nasal Swab  Result Value Ref Range Status   MRSA, PCR NEGATIVE NEGATIVE Final   Staphylococcus aureus NEGATIVE NEGATIVE Final    Comment: (NOTE) The Xpert SA Assay (FDA approved for NASAL specimens in patients 22 years of age and older), is one component of a comprehensive surveillance program. It is not intended to diagnose infection nor to guide or monitor treatment. Performed at [facility] [place], [place] [address], [place], [place] [address]      Labs: CBC: Recent Labs  Lab 03/14/21 2101 03/15/21 0734 03/16/21 0832 03/17/21 0358  WBC 3.9* 2.7* 2.1* 7.9  HGB 12.7 11.4* 11.7* 12.0  HCT 39.2 34.7* 36.4 38.9  MCV 99.7 99.7 102.8* 106.3*  PLT 201 157 157 [id number]   Basic Metabolic Panel: Recent Labs  Lab 03/14/21 2101 03/15/21 0734 03/16/21 0832 03/17/21 0358  NA 136 137 137 137  K 3.4* 3.2* 3.6 4.3  CL 98 103 104 103  CO2 26 25 23 26   GLUCOSE 179* 168* 168* 161*  BUN 12 10 11 15   CREATININE 0.76 0.72 0.76 0.91  CALCIUM 9.0 8.6* 8.6* 8.9   Liver Function Tests: Recent Labs  Lab 03/14/21 2101 03/15/21 0734 03/16/21 0832 03/17/21 0358  AST 115* 123* 64* 92*  ALT 112* 111* 92* 99*  ALKPHOS 206* 208* 201* 183*  BILITOT 6.0* 7.0* 4.5* 4.0*  PROT 7.0 5.9* 6.1* 6.6  ALBUMIN 3.3* 2.8* 2.9* 3.2*   CBG: Recent Labs  Lab 03/16/21 1554 03/16/21 1714 03/16/21 2346 03/17/21 0601 03/17/21 0751  GLUCAP 134* 169* 230* 124* 142*    Time spent: 35 minutes  Signed:  Berle Mull  [facility] 03/17/2021 8:53 PM

## 2021-03-20 ENCOUNTER — Ambulatory Visit
Admission: RE | Admit: 2021-03-20 | Discharge: 2021-03-20 | Disposition: A | Discharge status: Home / Self Care | Payer: Medicare Other | Source: Ambulatory Visit | Attending: Physician Assistant | Admitting: Physician Assistant

## 2021-03-20 ENCOUNTER — Other Ambulatory Visit: Payer: Self-pay | Admitting: Physician Assistant

## 2021-03-20 DIAGNOSIS — K8020 Calculus of gallbladder without cholecystitis without obstruction: Secondary | ICD-10-CM

## 2021-03-20 DIAGNOSIS — K851 Biliary acute pancreatitis without necrosis or infection: Secondary | ICD-10-CM | POA: Diagnosis not present

## 2021-03-20 DIAGNOSIS — K859 Acute pancreatitis without necrosis or infection, unspecified: Secondary | ICD-10-CM

## 2021-03-20 DIAGNOSIS — K3189 Other diseases of stomach and duodenum: Secondary | ICD-10-CM | POA: Diagnosis not present

## 2021-03-20 DIAGNOSIS — K838 Other specified diseases of biliary tract: Secondary | ICD-10-CM | POA: Diagnosis not present

## 2021-03-20 DIAGNOSIS — H5989 Other postprocedural complications and disorders of eye and adnexa, not elsewhere classified: Secondary | ICD-10-CM

## 2021-03-20 DIAGNOSIS — K6389 Other specified diseases of intestine: Secondary | ICD-10-CM | POA: Diagnosis not present

## 2021-03-20 IMAGING — CT CT ABD-PELV W/ CM
2 of 5 series · 13 of 46 positions shown, 15 images · IV contrast (iopamidol)
Comparison: [DATE]

CLINICAL DATA: Status post cholecystectomy with persistent
abdominal pain

EXAM:
CT ABDOMEN AND PELVIS WITH CONTRAST
TECHNIQUE: Multidetector CT imaging of the abdomen and pelvis was performed
using the standard protocol following bolus administration of
intravenous contrast.
CONTRAST:  100mL [02] IOPAMIDOL ([02]) INJECTION 61%

[Series 2: abd pelvis 5.00 br40 s3 axial · axial · 0.72mm/px · z∈[+1332,+1767]mm · 10 of 99 slices shown, 12 images]
[im 6/99  soft-tissue]
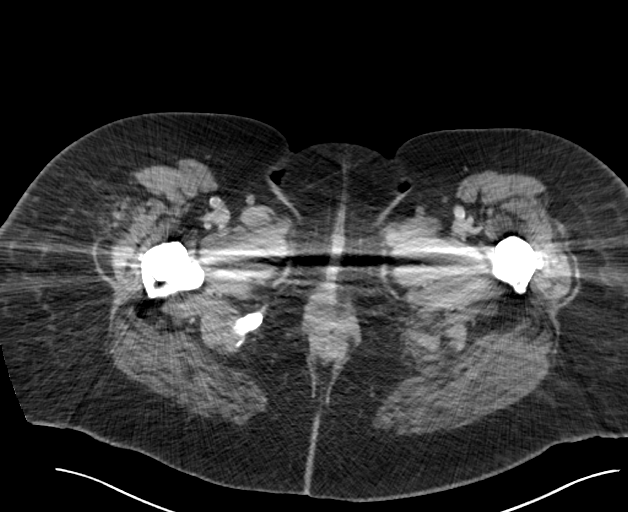
[im 6/99  bone]
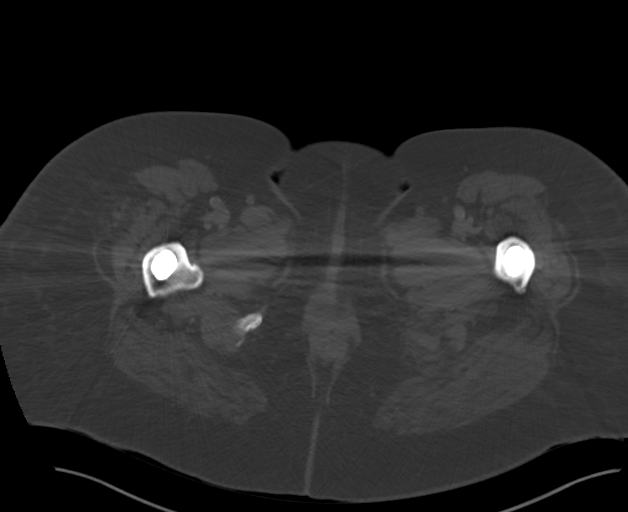
[im 17/99  soft-tissue]
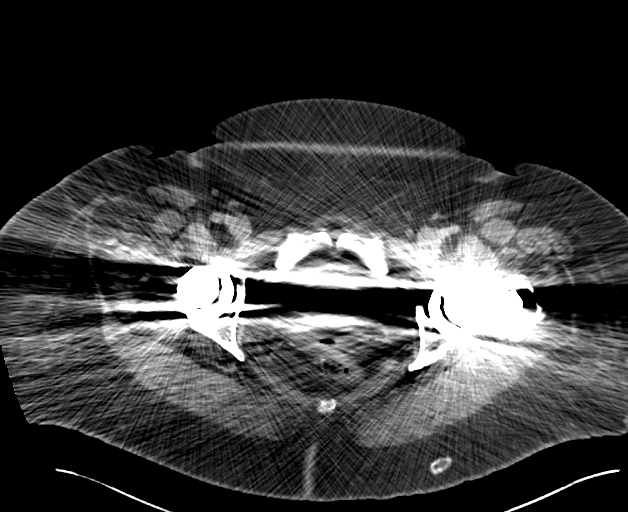
[im 28/99  soft-tissue]
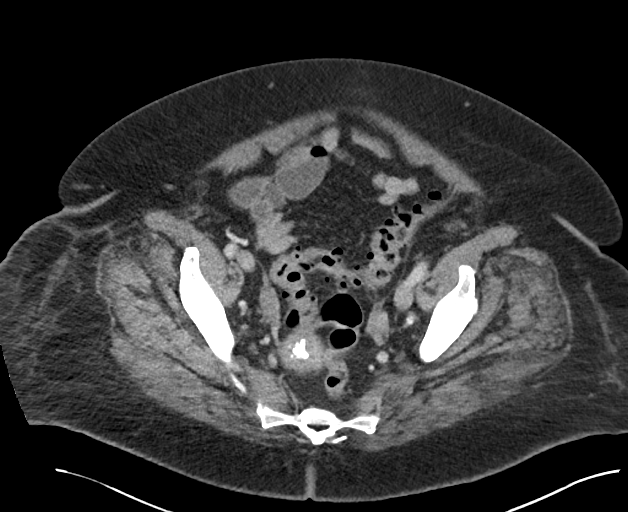
[im 33/99  soft-tissue]
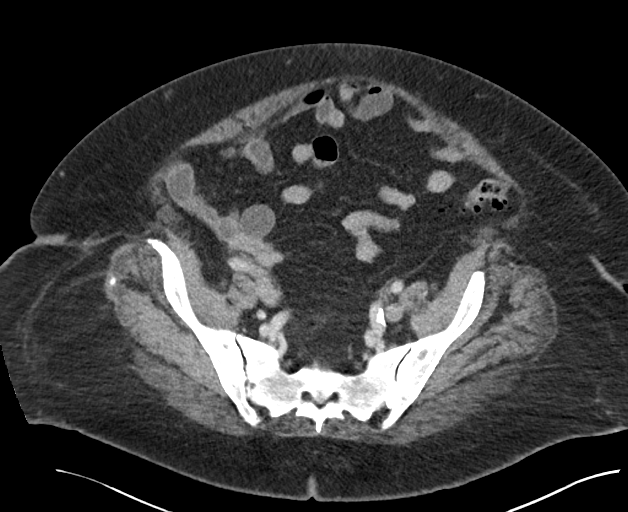
[im 44/99  soft-tissue]
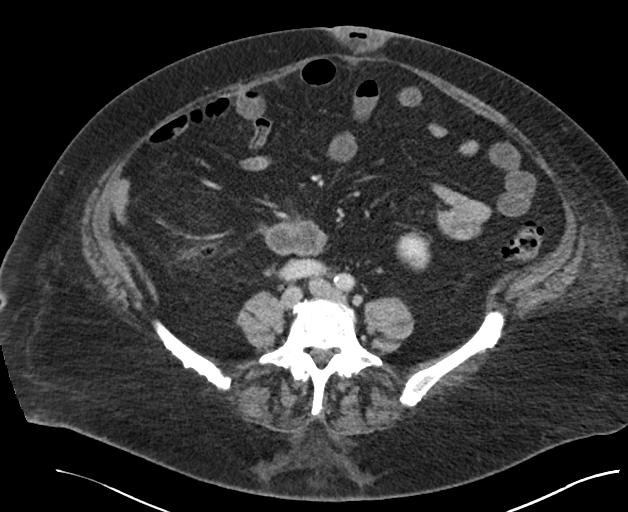
[im 55/99  soft-tissue]
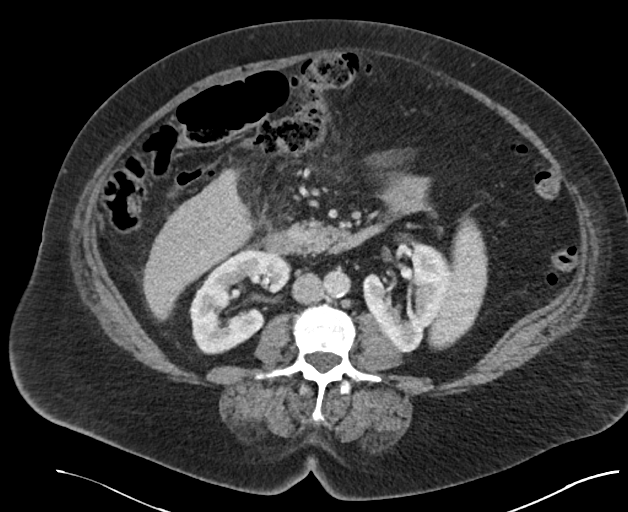
[im 66/99  soft-tissue]
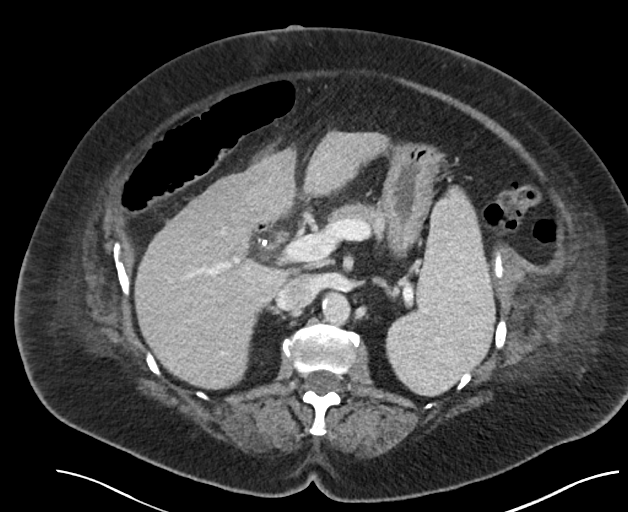
[im 71/99  soft-tissue]
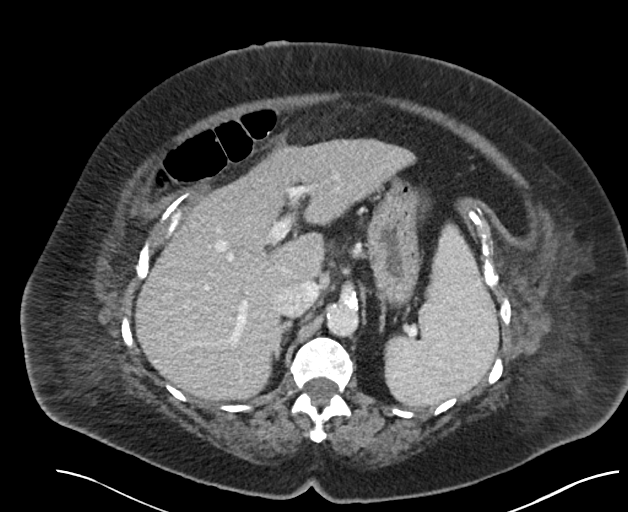
[im 82/99  soft-tissue]
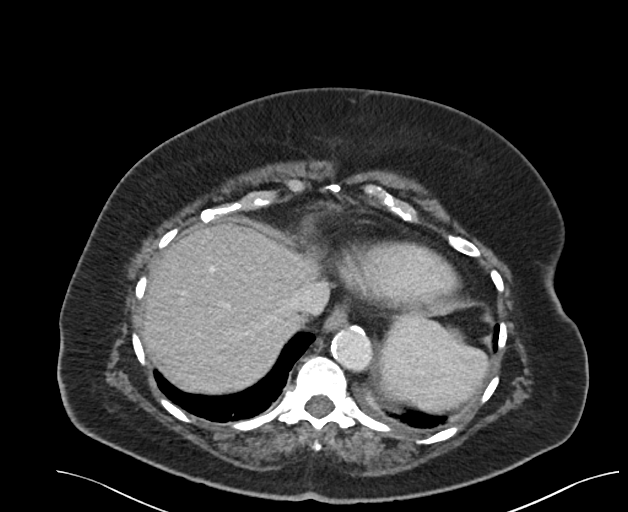
[im 82/99  bone]
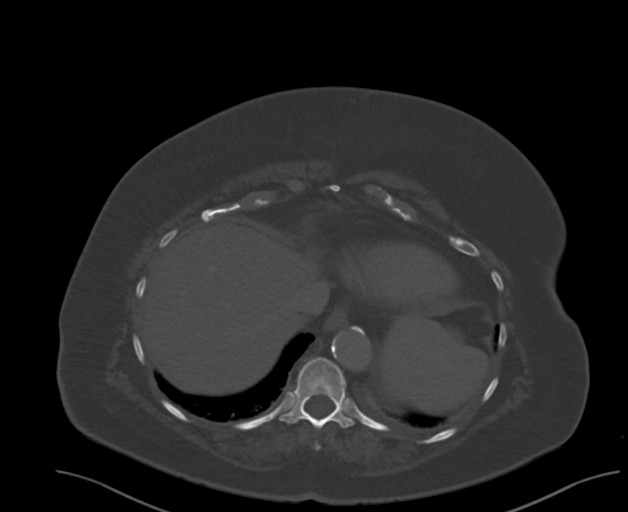
[im 93/99  soft-tissue]
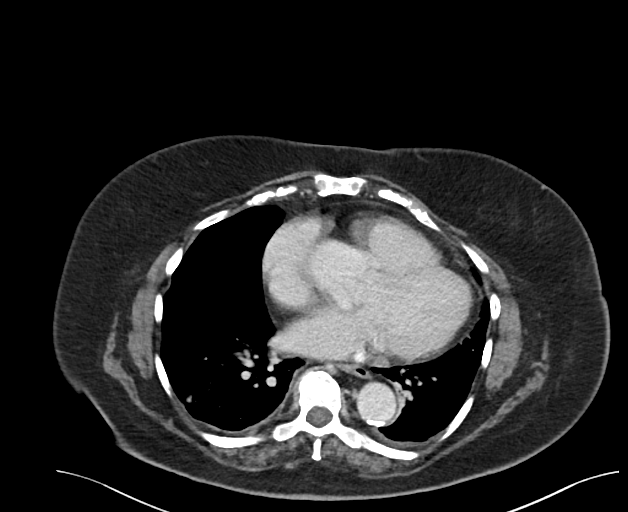

[Series 6: abd pelvis 2.00 br40 s3 cor · coronal · 0.89mm/px · 3 of 177 slices shown]
[im 59/177  soft-tissue]
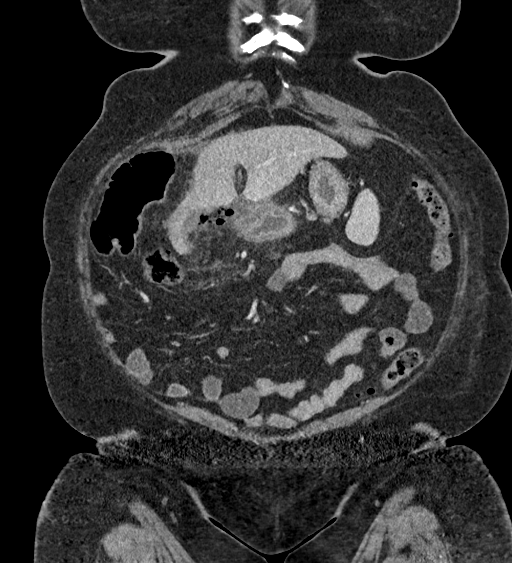
[im 79/177  soft-tissue]
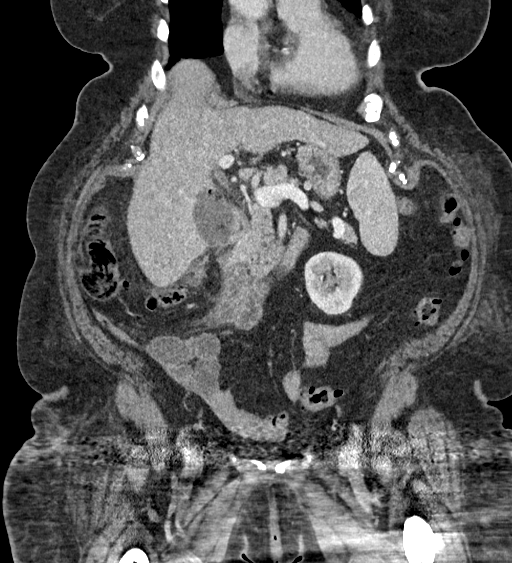
[im 98/177  soft-tissue]
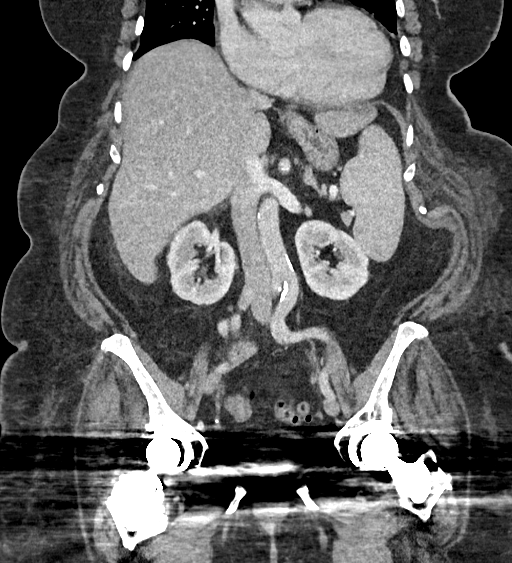

[13 of 46 positions shown; findings below may reference images not displayed]

FINDINGS: Lower chest: Small LEFT pleural effusion.  Bibasilar atelectasis.

Hepatobiliary: Status post cholecystectomy. There is mottled air
containing material in the gallbladder fossa. This is most
consistent with history of placement of SNOW hemostatic agent in
this region as per operative note. There is a small amount of fluid
tracking along the gallbladder fossa. This is most likely
postsurgical in etiology. No focal drainable fluid collection. No
suspicious focal hepatic lesion. Portal vein is patent. Decrease in
size of common bile duct in comparison to prior. No intrahepatic
biliary ductal dilation.

Pancreas: Minimal residual fat stranding adjacent to the head of the
pancreas. No pancreatic ductal dilation.

Spleen: Splenomegaly.

Adrenals/Urinary Tract: Adrenal glands are unremarkable. No
hydronephrosis. Kidneys enhance symmetrically. Bladder is
decompressed with evaluation limited secondary to streak artifact
from hip arthroplasty.

Stomach/Bowel: No evidence of bowel obstruction. Mild focal bowel
wall thickening of the stomach and proximal duodenum adjacent to the
surgical site, most likely reactive in etiology. Patient is status
post appendectomy. Diverticulosis.

Vascular/Lymphatic: Atherosclerotic calcifications of the aorta. No
suspicious lymphadenopathy.

Reproductive: Limited assessment of the pelvis secondary to streak
artifact from hip arthroplasty. Calcified fibroid uterus.

Other: Trace air within a small fat containing umbilical hernia
consistent with recent surgical intervention.

Musculoskeletal: Status post bilateral hip arthroplasty.
Degenerative changes of the lumbar spine.
IMPRESSION: 1. There are postsurgical changes within the gallbladder fossa
including a small amount of fluid, air, and air containing mottled
material which most likely reflects placement of SNOW hemostatic
agent. No focal drainable fluid collection.
2. No evidence of bowel obstruction.  Status post appendectomy.
3. Small amount of bowel wall thickening of the stomach and proximal
duodenum, likely reactive in etiology.
4. Decreased size of common bile duct.
5. Minimal residual fat stranding adjacent to the head of the
pancreas.

## 2021-03-20 MED ORDER — IOPAMIDOL (ISOVUE-300) INJECTION 61%
100.0000 mL | Freq: Once | INTRAVENOUS | Status: AC | PRN
  Administered 2021-03-20: 100 mL via INTRAVENOUS

## 2021-03-23 DIAGNOSIS — E46 Unspecified protein-calorie malnutrition: Secondary | ICD-10-CM | POA: Diagnosis not present

## 2021-03-23 DIAGNOSIS — K7460 Unspecified cirrhosis of liver: Secondary | ICD-10-CM | POA: Diagnosis not present

## 2021-03-23 DIAGNOSIS — I10 Essential (primary) hypertension: Secondary | ICD-10-CM | POA: Diagnosis not present

## 2021-03-23 DIAGNOSIS — K851 Biliary acute pancreatitis without necrosis or infection: Secondary | ICD-10-CM | POA: Diagnosis not present

## 2021-03-23 DIAGNOSIS — K8040 Calculus of bile duct with cholecystitis, unspecified, without obstruction: Secondary | ICD-10-CM | POA: Diagnosis not present

## 2021-03-23 DIAGNOSIS — T148XXA Other injury of unspecified body region, initial encounter: Secondary | ICD-10-CM | POA: Diagnosis not present

## 2021-03-23 DIAGNOSIS — J9811 Atelectasis: Secondary | ICD-10-CM | POA: Diagnosis not present

## 2021-03-30 DIAGNOSIS — R109 Unspecified abdominal pain: Secondary | ICD-10-CM | POA: Diagnosis not present

## 2021-04-07 DIAGNOSIS — R109 Unspecified abdominal pain: Secondary | ICD-10-CM | POA: Diagnosis not present

## 2021-04-08 ENCOUNTER — Ambulatory Visit (HOSPITAL_COMMUNITY)
Admission: RE | Admit: 2021-04-08 | Discharge: 2021-04-08 | Disposition: A | Discharge status: Home / Self Care | Payer: Medicare Other | Source: Ambulatory Visit | Attending: Student | Admitting: Student

## 2021-04-08 ENCOUNTER — Other Ambulatory Visit: Payer: Self-pay | Admitting: Student

## 2021-04-08 ENCOUNTER — Other Ambulatory Visit: Payer: Self-pay

## 2021-04-08 ENCOUNTER — Other Ambulatory Visit (HOSPITAL_COMMUNITY): Payer: Self-pay | Admitting: Student

## 2021-04-08 DIAGNOSIS — K838 Other specified diseases of biliary tract: Secondary | ICD-10-CM | POA: Insufficient documentation

## 2021-04-08 DIAGNOSIS — R1110 Vomiting, unspecified: Secondary | ICD-10-CM | POA: Diagnosis not present

## 2021-04-08 DIAGNOSIS — K9189 Other postprocedural complications and disorders of digestive system: Secondary | ICD-10-CM | POA: Diagnosis not present

## 2021-04-08 IMAGING — CT CT ABD-PELV W/ CM
2 of 5 series · 16 of 46 positions shown, 18 images · IV contrast (omnipaque)
Comparison: [DATE]

CLINICAL DATA: Cholecystectomy 3 weeks ago, nausea, vomiting,
question bile leak. History hypertension, type II diabetes mellitus,
GERD

EXAM:
CT ABDOMEN AND PELVIS WITH CONTRAST
TECHNIQUE: Multidetector CT imaging of the abdomen and pelvis was performed
using the standard protocol following bolus administration of
intravenous contrast. Sagittal and coronal MPR images reconstructed
from axial data set.
CONTRAST:  100mL OMNIPAQUE IOHEXOL 300 MG/ML SOLN IV. Dilute oral
contrast.

[Series 2: axial st · axial · 0.82mm/px · z∈[-646,-226]mm · 13 of 99 slices shown, 15 images]
[im 8/99  soft-tissue]
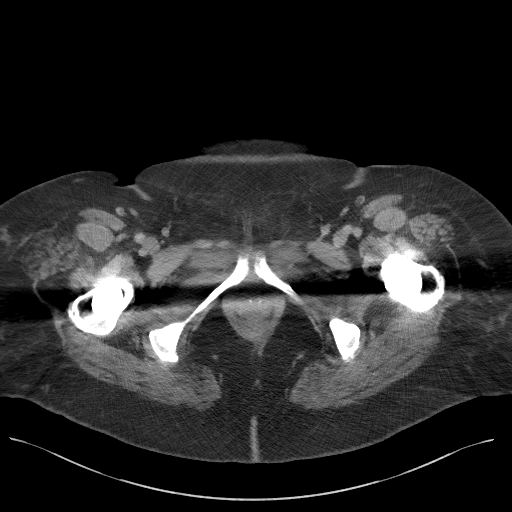
[im 8/99  bone]
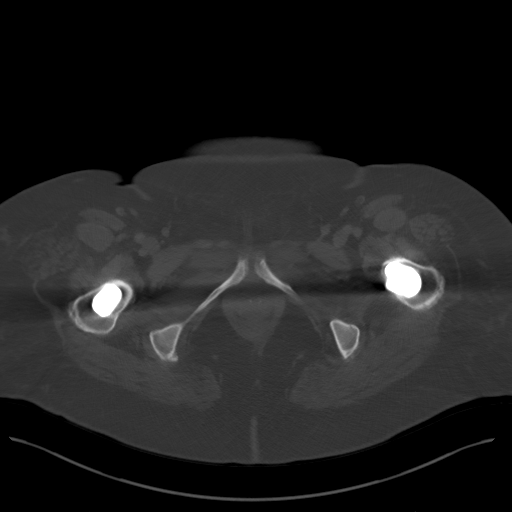
[im 15/99  soft-tissue]
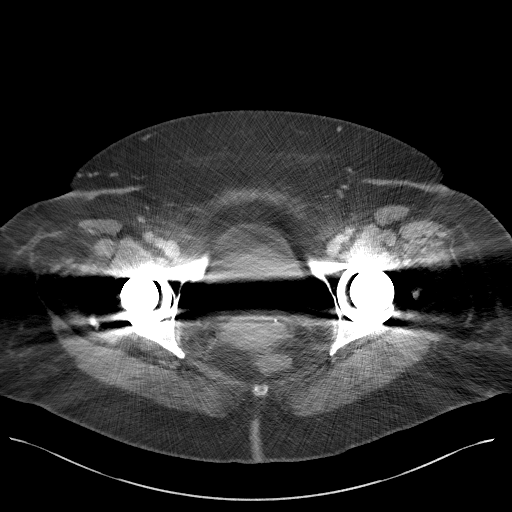
[im 22/99  soft-tissue]
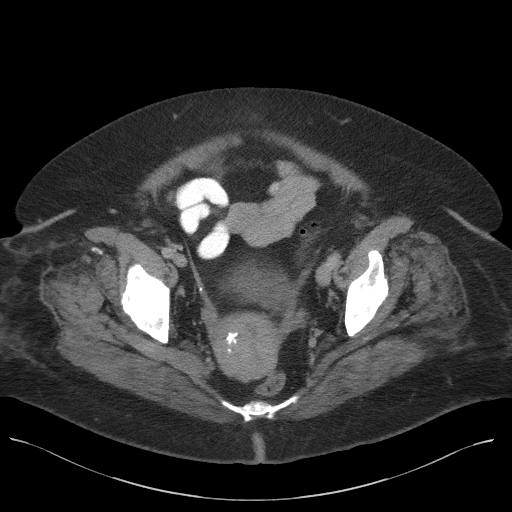
[im 29/99  soft-tissue]
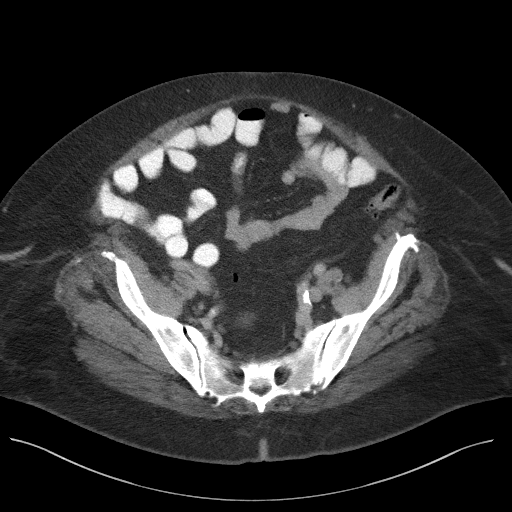
[im 36/99  soft-tissue]
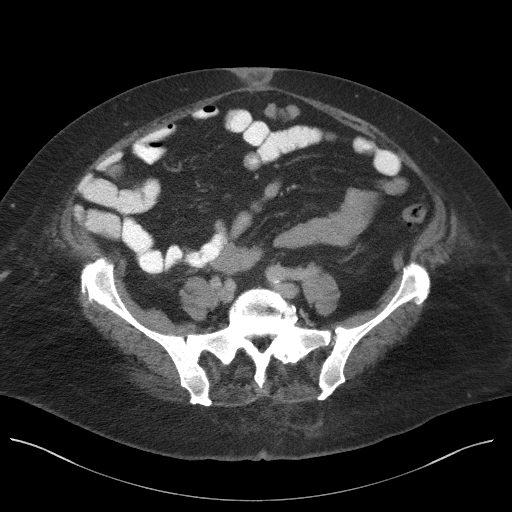
[im 43/99  soft-tissue]
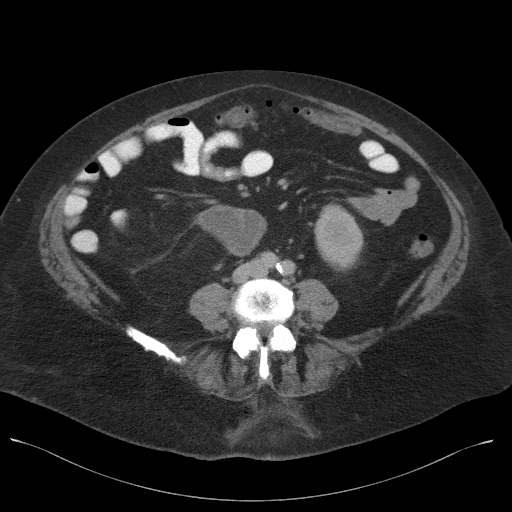
[im 50/99  soft-tissue]
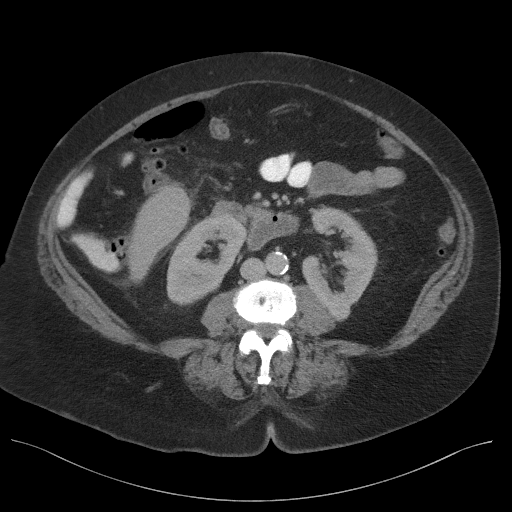
[im 57/99  soft-tissue]
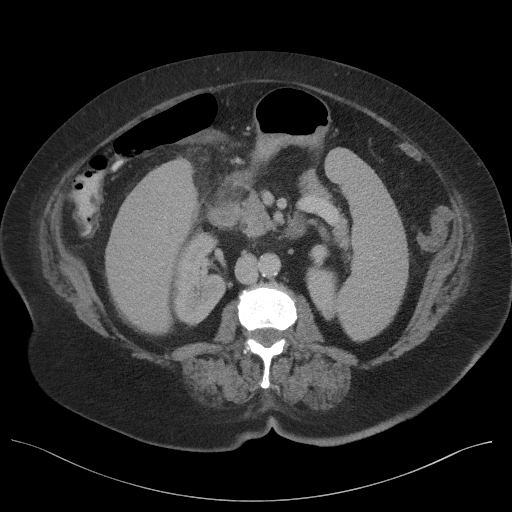
[im 64/99  soft-tissue]
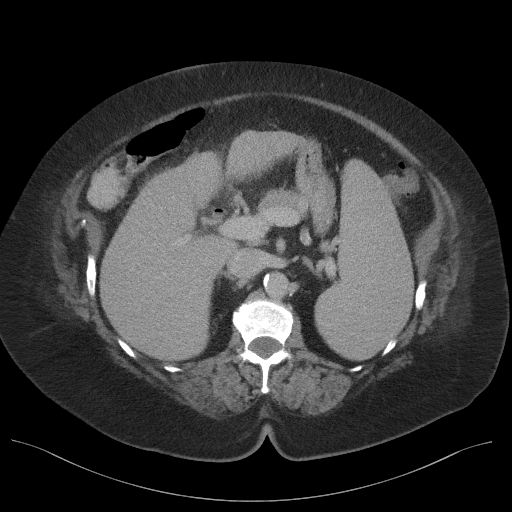
[im 64/99  bone]
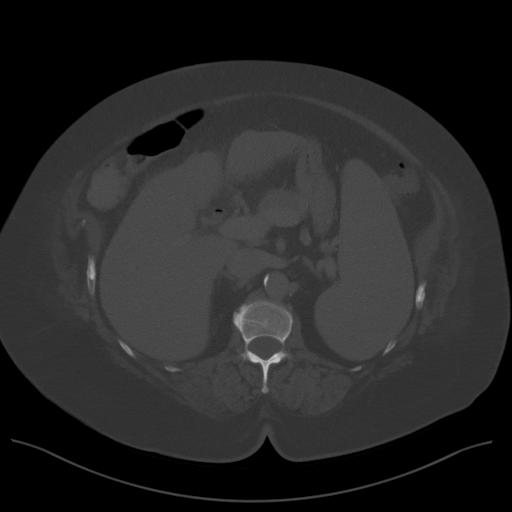
[im 71/99  soft-tissue]
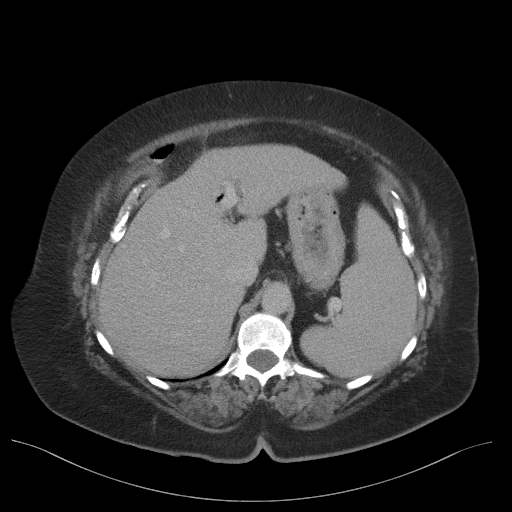
[im 78/99  soft-tissue]
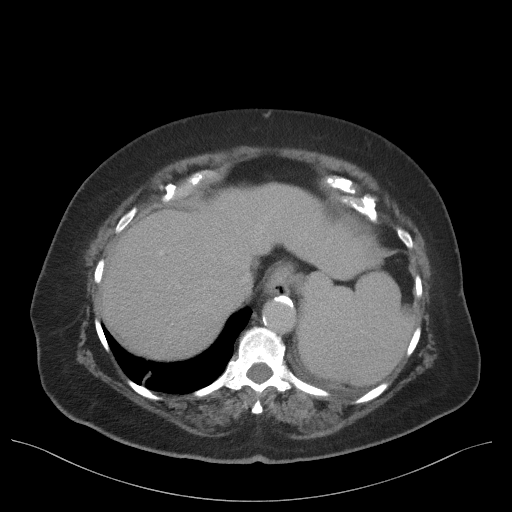
[im 85/99  soft-tissue]
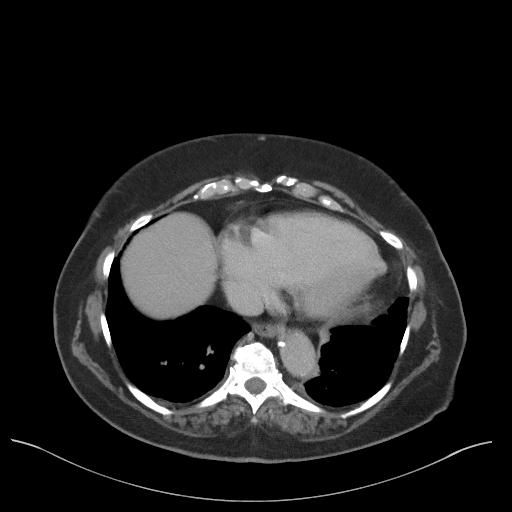
[im 92/99  soft-tissue]
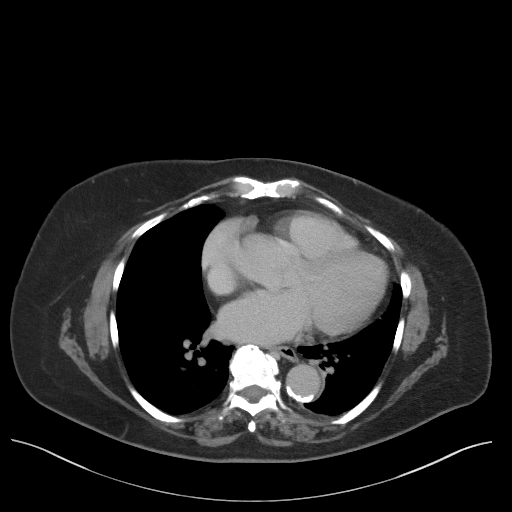

[Series 4: coronal st · coronal · 0.96mm/px · 3 of 106 slices shown]
[im 36/106  soft-tissue]
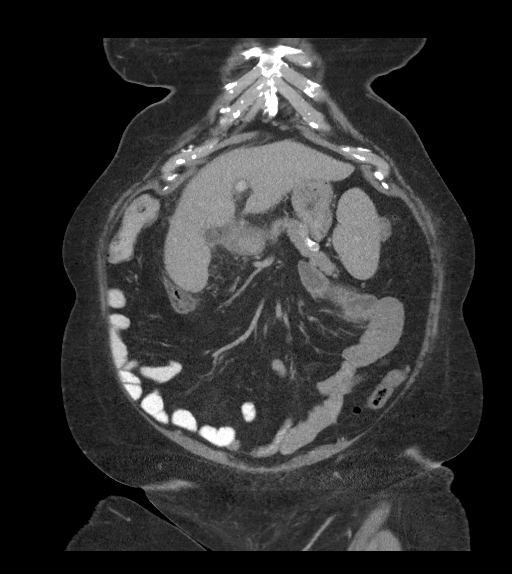
[im 47/106  soft-tissue]
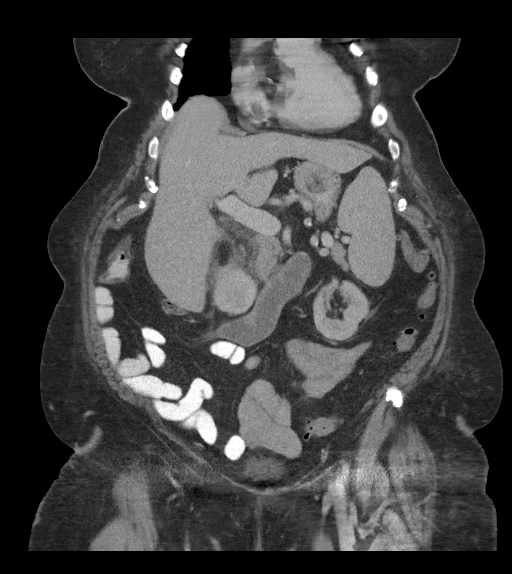
[im 59/106  soft-tissue]
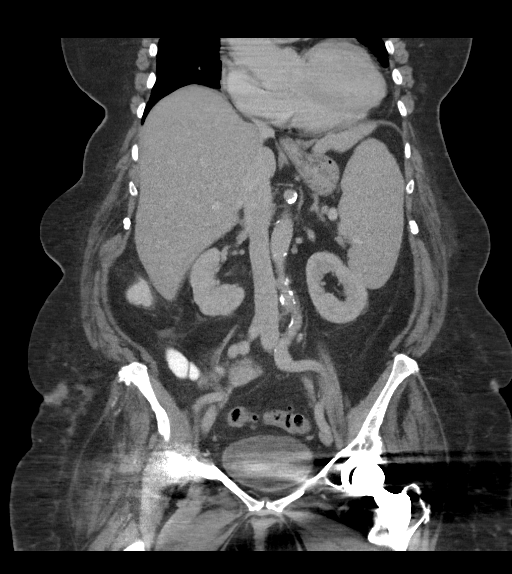

[16 of 46 positions shown; findings below may reference images not displayed]

FINDINGS: Lower chest: Minimal bibasilar atelectasis

Hepatobiliary: Gallbladder surgically absent. Liver unremarkable.
Small amount of air within CBD at LEFT hepatic duct. Small air
collection with minimal fluid at gallbladder fossa, decreased since
previous exam, likely resolving postoperative collection. No
intrahepatic biliary dilatation.

Pancreas: Normal appearance

Spleen: Enlarged, 16.1 x 5.9 x 16.2 cm (volume = 810 cm^3). Single
tiny nonspecific low-attenuation focus superiorly unchanged.

Adrenals/Urinary Tract: Adrenal glands, kidneys, and visualized
ureters normal appearance. Bladder and distal ureters are obscured
by beam hardening artifacts in pelvis.

Stomach/Bowel: Appendix surgically absent by history. Scattered
colonic diverticula without evidence of diverticulitis. Stomach and
bowel loops normal appearance

Vascular/Lymphatic: Atherosclerotic calcifications aorta and iliac
arteries as well as common origin celiac and superior mesenteric
arteries. Aorta normal caliber. No adenopathy.

Reproductive: Calcification within uterus likely degenerated
leiomyoma RIGHT fundus 2.0 cm diameter. Unremarkable ovaries.

Other: No free air or free fluid. Infiltrative changes at umbilicus
likely related to prior laparoscopic procedure. No hernia.

Musculoskeletal: BILATERAL hip prostheses. Osseous demineralization.
Degenerative disc disease changes lumbar spine.
IMPRESSION: Post cholecystectomy with small air and fluid collection at
gallbladder fossa, decreased since previous exam, likely resolving
postoperative collection.

Splenomegaly.

Scattered colonic diverticulosis without evidence of diverticulitis.

No new intra-abdominal or intrapelvic abnormalities.

Aortic Atherosclerosis ([HG]-[HG]).

## 2021-04-08 MED ORDER — IOHEXOL 9 MG/ML PO SOLN: 1000.0000 mL | ORAL | Status: AC

## 2021-04-08 MED ORDER — SODIUM CHLORIDE (PF) 0.9 % IJ SOLN
INTRAMUSCULAR | Status: AC
  Filled 2021-04-08: qty 50

## 2021-04-08 MED ORDER — IOHEXOL 300 MG/ML  SOLN
100.0000 mL | Freq: Once | INTRAMUSCULAR | Status: AC | PRN
  Administered 2021-04-08: 100 mL via INTRAVENOUS

## 2021-04-09 ENCOUNTER — Other Ambulatory Visit: Payer: Self-pay | Admitting: Student

## 2021-04-09 ENCOUNTER — Other Ambulatory Visit (HOSPITAL_COMMUNITY): Payer: Self-pay | Admitting: Student

## 2021-04-09 ENCOUNTER — Ambulatory Visit (HOSPITAL_COMMUNITY)
Admission: RE | Admit: 2021-04-09 | Discharge: 2021-04-09 | Disposition: A | Discharge status: Home / Self Care | Payer: Medicare Other | Source: Ambulatory Visit | Attending: Student | Admitting: Student

## 2021-04-09 DIAGNOSIS — K839 Disease of biliary tract, unspecified: Secondary | ICD-10-CM | POA: Insufficient documentation

## 2021-04-09 DIAGNOSIS — R109 Unspecified abdominal pain: Secondary | ICD-10-CM | POA: Diagnosis not present

## 2021-04-09 IMAGING — NM NM HEPATOBILIARY SCAN
2 series · 12 of 12 positions shown · non-contrast
Comparison: CT [DATE]

CLINICAL DATA: Status post upper laparoscopic cholecystectomy on
[DATE]. Intermittent abdominal pain.

EXAM:
NUCLEAR MEDICINE HEPATOBILIARY IMAGING
TECHNIQUE: Sequential images of the abdomen were obtained [DATE] minutes
following intravenous administration of radiopharmaceutical.
RADIOPHARMACEUTICALS:  5.5 mCi [G8]  Choletec IV

[he hepatobiliary · 4.52mm/px · 6 of 59 frames shown (1 of 2)]
[frame 5/59]
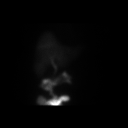
[frame 15/59]
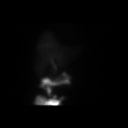
[frame 25/59]
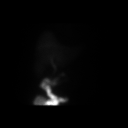
[frame 35/59]
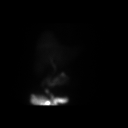
[frame 45/59]
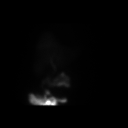
[frame 55/59]
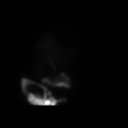

[he hepatobiliary · 4.52mm/px · 6 of 60 frames shown (2 of 2)]
[frame 6/60]
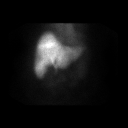
[frame 16/60]
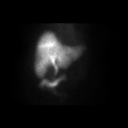
[frame 26/60]
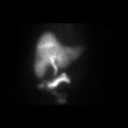
[frame 36/60]
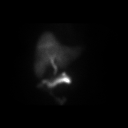
[frame 46/60]
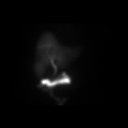
[frame 56/60]
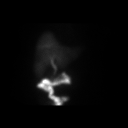

[12 of 12 positions shown; findings below may reference images not displayed]

FINDINGS: Prompt clearance radiotracer from blood pool and homogeneous uptake
in liver. Counts are evident within the common bile duct 25 minutes.
Counts enter the small bowel by 30 minutes. Imaging continues for 2
hours. No evidence of bile activity outside the biliary tree or
bowel.
IMPRESSION: No evidence of biliary leak.

Patent common bile duct.

## 2021-04-09 MED ORDER — TECHNETIUM TC 99M MEBROFENIN IV KIT
5.5000 | PACK | Freq: Once | INTRAVENOUS | Status: AC | PRN
  Administered 2021-04-09: 5.5 via INTRAVENOUS

## 2021-04-16 DIAGNOSIS — K7460 Unspecified cirrhosis of liver: Secondary | ICD-10-CM | POA: Diagnosis not present

## 2021-04-16 DIAGNOSIS — Z800 Family history of malignant neoplasm of digestive organs: Secondary | ICD-10-CM | POA: Diagnosis not present

## 2021-04-16 DIAGNOSIS — L989 Disorder of the skin and subcutaneous tissue, unspecified: Secondary | ICD-10-CM | POA: Diagnosis not present

## 2021-04-16 DIAGNOSIS — R5381 Other malaise: Secondary | ICD-10-CM | POA: Diagnosis not present

## 2021-04-16 DIAGNOSIS — R1319 Other dysphagia: Secondary | ICD-10-CM | POA: Diagnosis not present

## 2021-04-16 DIAGNOSIS — R197 Diarrhea, unspecified: Secondary | ICD-10-CM | POA: Diagnosis not present

## 2021-04-23 DIAGNOSIS — K7460 Unspecified cirrhosis of liver: Secondary | ICD-10-CM | POA: Diagnosis not present

## 2021-04-23 DIAGNOSIS — D649 Anemia, unspecified: Secondary | ICD-10-CM | POA: Diagnosis not present

## 2021-04-23 DIAGNOSIS — R531 Weakness: Secondary | ICD-10-CM | POA: Diagnosis not present

## 2021-04-23 DIAGNOSIS — R140 Abdominal distension (gaseous): Secondary | ICD-10-CM | POA: Diagnosis not present

## 2021-04-23 DIAGNOSIS — R110 Nausea: Secondary | ICD-10-CM | POA: Diagnosis not present

## 2021-04-28 DIAGNOSIS — D485 Neoplasm of uncertain behavior of skin: Secondary | ICD-10-CM | POA: Diagnosis not present

## 2021-04-28 DIAGNOSIS — L239 Allergic contact dermatitis, unspecified cause: Secondary | ICD-10-CM | POA: Diagnosis not present

## 2021-06-16 DIAGNOSIS — Z1211 Encounter for screening for malignant neoplasm of colon: Secondary | ICD-10-CM | POA: Diagnosis not present

## 2021-06-16 DIAGNOSIS — Z800 Family history of malignant neoplasm of digestive organs: Secondary | ICD-10-CM | POA: Diagnosis not present

## 2021-06-16 DIAGNOSIS — K5730 Diverticulosis of large intestine without perforation or abscess without bleeding: Secondary | ICD-10-CM | POA: Diagnosis not present

## 2021-06-16 DIAGNOSIS — D123 Benign neoplasm of transverse colon: Secondary | ICD-10-CM | POA: Diagnosis not present

## 2021-06-16 DIAGNOSIS — K648 Other hemorrhoids: Secondary | ICD-10-CM | POA: Diagnosis not present

## 2021-06-18 DIAGNOSIS — D123 Benign neoplasm of transverse colon: Secondary | ICD-10-CM | POA: Diagnosis not present

## 2021-11-05 DIAGNOSIS — I10 Essential (primary) hypertension: Secondary | ICD-10-CM | POA: Diagnosis not present

## 2021-11-05 DIAGNOSIS — E1122 Type 2 diabetes mellitus with diabetic chronic kidney disease: Secondary | ICD-10-CM | POA: Diagnosis not present

## 2021-11-05 DIAGNOSIS — K219 Gastro-esophageal reflux disease without esophagitis: Secondary | ICD-10-CM | POA: Diagnosis not present

## 2021-11-05 DIAGNOSIS — E1129 Type 2 diabetes mellitus with other diabetic kidney complication: Secondary | ICD-10-CM | POA: Diagnosis not present

## 2021-11-05 DIAGNOSIS — N182 Chronic kidney disease, stage 2 (mild): Secondary | ICD-10-CM | POA: Diagnosis not present

## 2021-11-05 DIAGNOSIS — E785 Hyperlipidemia, unspecified: Secondary | ICD-10-CM | POA: Diagnosis not present

## 2021-11-05 DIAGNOSIS — E1165 Type 2 diabetes mellitus with hyperglycemia: Secondary | ICD-10-CM | POA: Diagnosis not present

## 2021-11-16 ENCOUNTER — Other Ambulatory Visit: Payer: Self-pay

## 2021-11-16 ENCOUNTER — Emergency Department (HOSPITAL_COMMUNITY)
Admission: EM | Admit: 2021-11-16 | Discharge: 2021-11-17 | Disposition: A | Discharge status: Home / Self Care | Payer: Medicare Other | Attending: Emergency Medicine | Admitting: Emergency Medicine

## 2021-11-16 ENCOUNTER — Emergency Department (HOSPITAL_COMMUNITY): Payer: Medicare Other

## 2021-11-16 ENCOUNTER — Encounter (HOSPITAL_COMMUNITY): Payer: Self-pay

## 2021-11-16 DIAGNOSIS — I129 Hypertensive chronic kidney disease with stage 1 through stage 4 chronic kidney disease, or unspecified chronic kidney disease: Secondary | ICD-10-CM | POA: Insufficient documentation

## 2021-11-16 DIAGNOSIS — I517 Cardiomegaly: Secondary | ICD-10-CM | POA: Diagnosis not present

## 2021-11-16 DIAGNOSIS — Z79899 Other long term (current) drug therapy: Secondary | ICD-10-CM | POA: Diagnosis not present

## 2021-11-16 DIAGNOSIS — E1122 Type 2 diabetes mellitus with diabetic chronic kidney disease: Secondary | ICD-10-CM | POA: Insufficient documentation

## 2021-11-16 DIAGNOSIS — N182 Chronic kidney disease, stage 2 (mild): Secondary | ICD-10-CM | POA: Diagnosis not present

## 2021-11-16 DIAGNOSIS — Z794 Long term (current) use of insulin: Secondary | ICD-10-CM | POA: Insufficient documentation

## 2021-11-16 DIAGNOSIS — R079 Chest pain, unspecified: Secondary | ICD-10-CM | POA: Diagnosis not present

## 2021-11-16 DIAGNOSIS — R600 Localized edema: Secondary | ICD-10-CM | POA: Insufficient documentation

## 2021-11-16 DIAGNOSIS — Z8616 Personal history of COVID-19: Secondary | ICD-10-CM | POA: Insufficient documentation

## 2021-11-16 DIAGNOSIS — R0602 Shortness of breath: Secondary | ICD-10-CM | POA: Diagnosis not present

## 2021-11-16 DIAGNOSIS — R0789 Other chest pain: Secondary | ICD-10-CM | POA: Insufficient documentation

## 2021-11-16 DIAGNOSIS — Z20822 Contact with and (suspected) exposure to covid-19: Secondary | ICD-10-CM | POA: Insufficient documentation

## 2021-11-16 LAB — CBC
HCT: 38.2 % (ref 36.0–46.0)
Hemoglobin: 12.5 g/dL (ref 12.0–15.0)
MCH: 31.8 pg (ref 26.0–34.0)
MCHC: 32.7 g/dL (ref 30.0–36.0)
MCV: 97.2 fL (ref 80.0–100.0)
Platelets: 146 10*3/uL — ABNORMAL LOW (ref 150–400)
RBC: 3.93 MIL/uL (ref 3.87–5.11)
RDW: 14.6 % (ref 11.5–15.5)
WBC: 3.3 10*3/uL — ABNORMAL LOW (ref 4.0–10.5)
nRBC: 0 % (ref 0.0–0.2)

## 2021-11-16 LAB — BASIC METABOLIC PANEL
Anion gap: 9 (ref 5–15)
BUN: 18 mg/dL (ref 8–23)
CO2: 25 mmol/L (ref 22–32)
Calcium: 8.7 mg/dL — ABNORMAL LOW (ref 8.9–10.3)
Chloride: 104 mmol/L (ref 98–111)
Creatinine, Ser: 0.74 mg/dL (ref 0.44–1.00)
GFR, Estimated: >60 mL/min (ref >60)
Glucose, Bld: 114 mg/dL — ABNORMAL HIGH (ref 70–99)
Potassium: 3.6 mmol/L (ref 3.5–5.1)
Sodium: 138 mmol/L (ref 135–145)

## 2021-11-16 LAB — RESP PANEL BY RT-PCR (FLU A&B, COVID) ARPGX2
Influenza A by PCR: NEGATIVE
Influenza B by PCR: NEGATIVE
SARS Coronavirus 2 by RT PCR: NEGATIVE

## 2021-11-16 LAB — TROPONIN I (HIGH SENSITIVITY): Troponin I (High Sensitivity): 5 ng/L (ref <18)

## 2021-11-16 IMAGING — CR DG CHEST 2V
2 series · 2 of 2 positions shown · non-contrast
Comparison: None.

CLINICAL DATA: chest pain

EXAM:
CHEST - 2 VIEW.  Patient is slightly rotated on the frontal view.

[w chest lat]
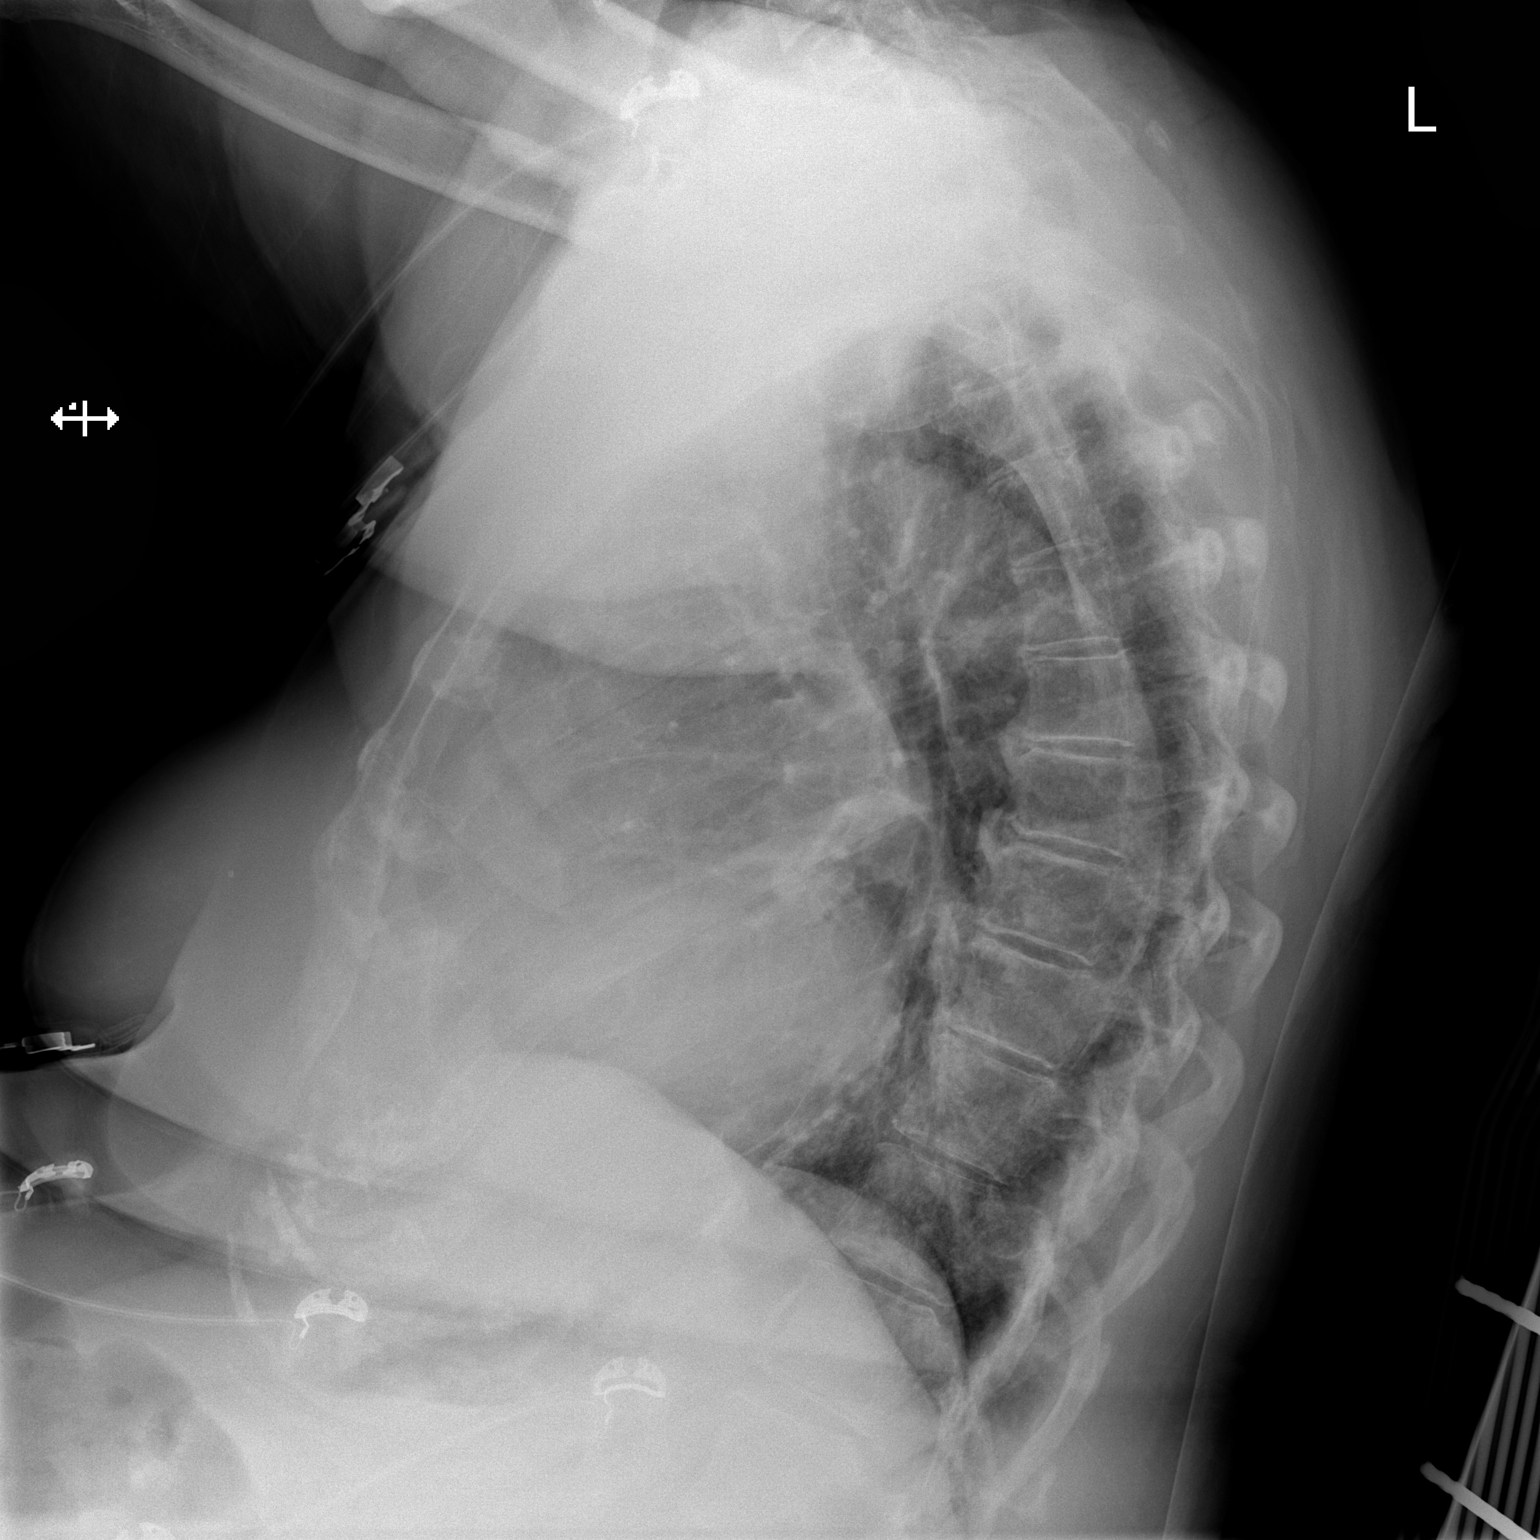

[x chest ap]
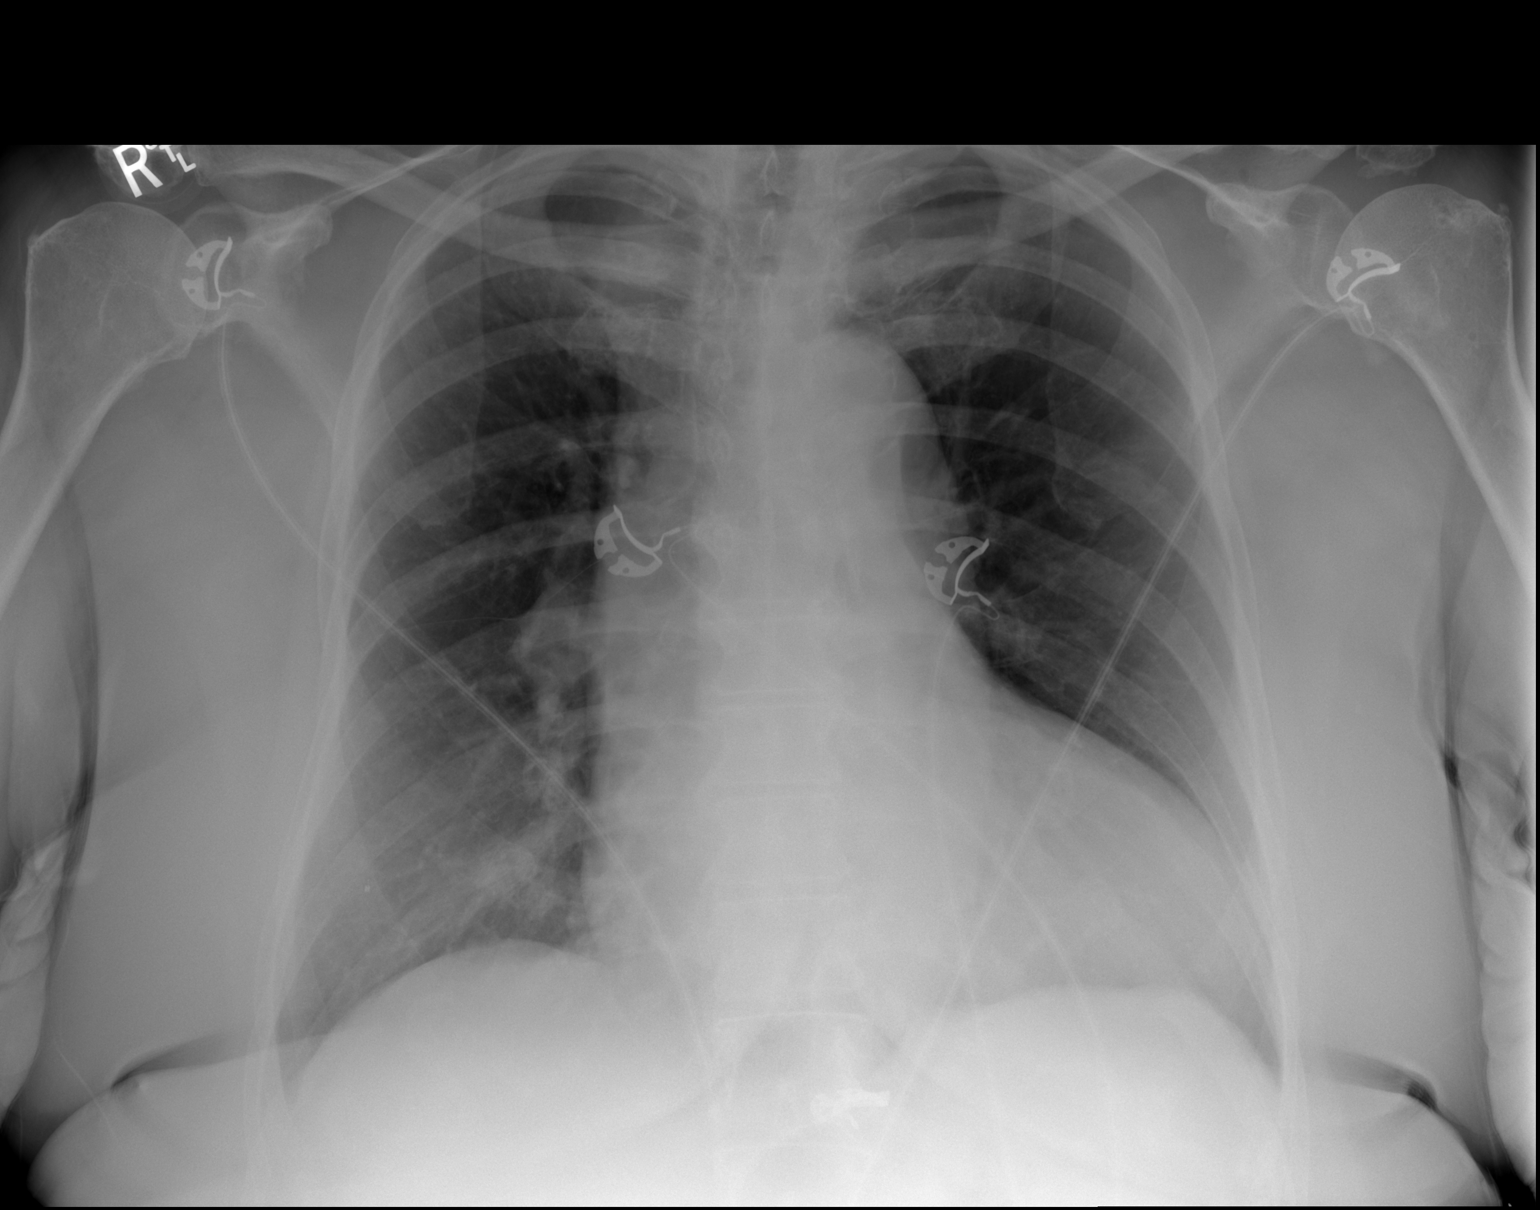

[2 of 2 positions shown; findings below may reference images not displayed]

FINDINGS: The heart and mediastinal contours are unchanged. Aortic
calcification.

Query rounded pericentimeter density overlying the right lower lobe.
No focal consolidation. No pulmonary edema. No pleural effusion. No
pneumothorax.

No acute osseous abnormality.
IMPRESSION: Query rounded pericentimeter density overlying the right lower lobe.
Given slight patient rotation, recommend repeat PA chest x-ray for
further evaluation.

## 2021-11-16 NOTE — ED Triage Notes (Signed)
Patient reports that she had a fall last week due to tripping on her own feet. Patient states she has intermittent chest pain and states her chest "feels funny all the time like fluttering." Patient states she has SOB constantly since having chest pain.

## 2021-11-16 NOTE — ED Provider Triage Note (Signed)
Emergency Medicine Provider Triage Evaluation Note  Kathleen Wiley , a 76 y.o. female  was evaluated in triage.  Pt complains of chest pain, shortness of breath intermittently for 1 week duration.  She reports day of symptom onset she had a mechanical fall where she tripped and fell in her bathroom hitting her chest across her bathtub.  She reports she has had intermittent and severe episodes of chest pain associated with shortness of breath, lightheadedness since the incident.  She denies chest pain, shortness of breath, or palpitations just prior to the fall.  She does endorse history of A. fib but denies being on a blood thinner.  Review of Systems  Positive: As above Negative: As above  Physical Exam  BP (!) 157/83 (BP Location: Right Arm)    Pulse 77    Temp 98.7 F (37.1 C) (Oral)    Resp 18    Ht 5\' 5"  (1.651 m)    Wt 103.9 kg    SpO2 95%    BMI 38.11 kg/m  Gen:   Awake, no distress   Resp:  Normal effort  MSK:   Moves extremities without difficulty  Other:    Medical Decision Making  Medically screening exam initiated at 6:55 PM.  Appropriate orders placed.  TRISTINE LANGI was informed that the remainder of the evaluation will be completed by another provider, this initial triage assessment does not replace that evaluation, and the importance of remaining in the ED until their evaluation is complete.     Evlyn Courier, PA-C 11/16/21 [id number]

## 2021-11-16 NOTE — ED Notes (Signed)
SAVE BLUE TUBE IN MAIN LAB

## 2021-11-17 ENCOUNTER — Encounter (HOSPITAL_COMMUNITY): Payer: Self-pay | Admitting: Emergency Medicine

## 2021-11-17 ENCOUNTER — Emergency Department (HOSPITAL_COMMUNITY): Payer: Medicare Other

## 2021-11-17 DIAGNOSIS — I517 Cardiomegaly: Secondary | ICD-10-CM | POA: Diagnosis not present

## 2021-11-17 LAB — TROPONIN I (HIGH SENSITIVITY): Troponin I (High Sensitivity): 4 ng/L (ref <18)

## 2021-11-17 LAB — D-DIMER, QUANTITATIVE: D-Dimer, Quant: <0.27 ug/mL-FEU (ref 0.00–0.50)

## 2021-11-17 LAB — BRAIN NATRIURETIC PEPTIDE: B Natriuretic Peptide: 32.4 pg/mL (ref 0.0–100.0)

## 2021-11-17 IMAGING — DX DG CHEST 1V
1 series · 1 of 1 positions shown · non-contrast
Comparison: [DATE]

CLINICAL DATA: Abnormality on prior chest x-ray

EXAM:
CHEST  1 VIEW

[chest ap]
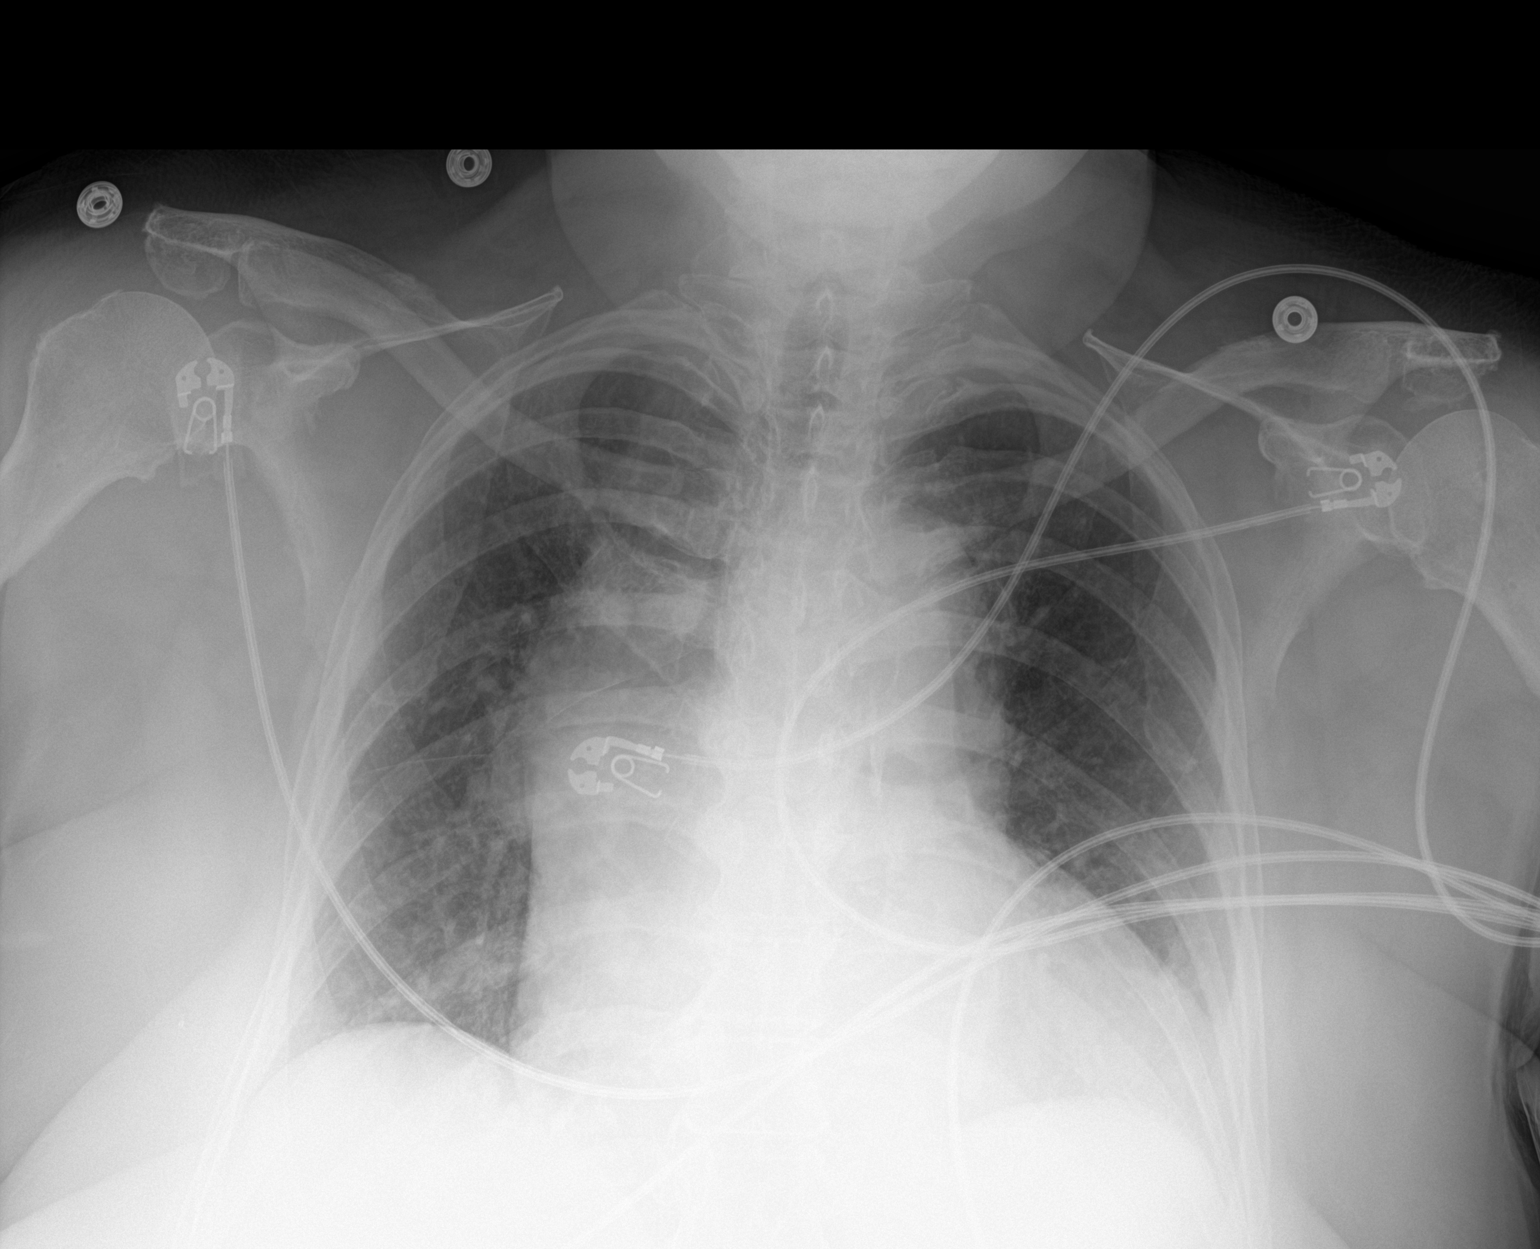

[1 of 1 positions shown; findings below may reference images not displayed]

FINDINGS: Cardiomegaly with vascular congestion. Previously seen nodular
density at the right lung base no longer visualized. No confluent
opacities or effusions. No acute bony abnormality.
IMPRESSION: Cardiomegaly, vascular congestion.

## 2021-11-17 MED ORDER — FAMOTIDINE IN NACL 20-0.9 MG/50ML-% IV SOLN
20.0000 mg | Freq: Once | INTRAVENOUS | Status: AC
  Administered 2021-11-17: 20 mg via INTRAVENOUS
  Filled 2021-11-17: qty 50

## 2021-11-17 MED ORDER — SODIUM CHLORIDE 0.9 % IV BOLUS
500.0000 mL | Freq: Once | INTRAVENOUS | Status: AC
  Administered 2021-11-17: 500 mL via INTRAVENOUS

## 2021-11-17 MED ORDER — LIDOCAINE VISCOUS HCL 2 % MT SOLN
15.0000 mL | Freq: Once | OROMUCOSAL | Status: AC
  Administered 2021-11-17: 15 mL via ORAL
  Filled 2021-11-17: qty 15

## 2021-11-17 MED ORDER — ALUM & MAG HYDROXIDE-SIMETH 200-200-20 MG/5ML PO SUSP
30.0000 mL | Freq: Once | ORAL | Status: AC
  Administered 2021-11-17: 30 mL via ORAL
  Filled 2021-11-17: qty 30

## 2021-11-17 NOTE — ED Provider Notes (Signed)
[facility] DEPT Provider Note   CSN: [id number] Arrival date & time: 11/16/21  1823     History  Chief Complaint  Patient presents with   Chest Pain   Shortness of Breath    MONIA TIMMERS is a 76 y.o. female.  This is a 76 y.o. female with significant medical history as below, including hyperlipidemia, hypertension, anxiety who presents to the ED with complaint of chest pain.  Patient reports intermittent chest pain over the past week, felt the pain was worsened approximate 24 hours ago when she was going to bed.  Pain described as a pressure, tightness sensation to her mid sternum, radiating to her neck.  Pain worse when lying down flat and going to bed.  She is experience this in the past intermittently.  Pain lasted approximately 1 to 2 hours and resolve spontaneously.  Not associate with exertion or dyspnea.  No palpitations or nausea, lightheadedness or diaphoresis.  She does not follow with cardiology.  She has been compliant with her medications, no recent stimulant or IV drug use, recent alcohol use.  No medication changes or injuries.  No change in bowel or bladder function, no abdominal pain, no rashes, no increased swelling to lower extremities.  Does report her anxiety is somewhat worsened from baseline, no SI HI.  Has been compliant with her anxiolytics at home.  No other acute medical complaints offered.  The history is provided by the patient. No language interpreter was used.  Chest Pain Associated symptoms: no abdominal pain, no back pain, no cough, no diaphoresis, no dysphagia, no fever, no headache, no nausea, no palpitations, no shortness of breath and no vomiting   Shortness of Breath Associated symptoms: chest pain   Associated symptoms: no abdominal pain, no cough, no diaphoresis, no fever, no headaches, no rash and no vomiting    HPI: A 76 year old patient with a history of obesity presents for evaluation of chest pain. Initial onset  of pain was more than 6 hours ago. The patient's chest pain is well-localized, is described as heaviness/pressure/tightness and is not worse with exertion. The patient's chest pain is not middle- or left-sided, is not sharp and does radiate to the arms/jaw/neck. The patient does not complain of nausea and denies diaphoresis. The patient has no history of stroke, has no history of peripheral artery disease, has not smoked in the past 90 days, denies any history of treated diabetes, has no relevant family history of coronary artery disease (first degree relative at less than age 57), is not hypertensive and has no history of hypercholesterolemia.  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[place]) 03/15/2021   Obesity with body mass index (BMI) of 30.0 to 39.9 03/15/2021   Acute gallstone pancreatitis 03/15/2021   Abnormal cervical Papanicolaou smear 02/18/2021   Abnormal liver function tests 02/18/2021   Acquired hallux valgus 02/18/2021   Acute hypoxemic respiratory failure ([place]) 02/18/2021   Aneurysm of thoracic aorta 02/18/2021   Anxiety 02/18/2021   Aortic root dilatation ([place]) 02/18/2021   Aortic valve disorder 02/18/2021   Arthropathy 02/18/2021   Callosity 02/18/2021   Cardiomegaly 02/18/2021   Chest discomfort 02/18/2021   Chronic kidney disease (CKD) stage G2/A1, mildly decreased glomerular filtration rate (GFR) between 60-89 mL/min/1.73 square meter and albuminuria creatinine ratio less than 30 mg/g 02/18/2021   Chronic kidney disease, stage 2 (mild) 02/18/2021  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   COVID-19 virus infection 02/18/2021   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   Edema 02/18/2021   Edema of both lower legs 02/18/2021   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   Leukopenia 02/18/2021   Low back pain 02/18/2021    Malabsorption syndrome 16/08/9603   Metabolic syndrome 54/07/8118   Mild cervical dysplasia 02/18/2021   Mixed anxiety and depressive disorder 02/18/2021   Neuropathy 02/18/2021   Obstructive sleep apnea syndrome 02/18/2021   Senile purpura ([place]) 02/18/2021   Snoring 02/18/2021   Elevated transaminase level 02/18/2021   Type 2 diabetes mellitus with hyperglycemia ([place]) 02/18/2021   Urge incontinence of urine 02/18/2021   Vitamin B12 deficiency (non anemic) 02/18/2021   Vitamin D deficiency 02/18/2021   Pneumonia due to COVID-19 virus 10/25/2019   Hypokalemia 10/25/2019   Hyperbilirubinemia 10/25/2019   Pre-diabetes    History of total hip arthroplasty, right 09/18/2018   Osteoarthritis of right hip 09/14/2018   Morbid obesity, BMI not known ([place]) 09/17/2016   Stasis ulcer ([place]) 09/17/2016   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 Authorizing Provider  acetaminophen (TYLENOL) 325 MG tablet Take 2 tablets (650 mg total) by mouth every 6 (six) hours as needed. Patient taking differently: Take 650 mg by mouth every 6 (six) hours as needed for mild pain. 03/17/21  Yes Saverio Danker, PA-C  Cholecalciferol (VITAMIN D) 50 MCG (2000 UT) tablet Take 2,000 Units by mouth daily.    Yes [provider]  cyanocobalamin 1000 MCG tablet Take 1,000 mcg by mouth daily.    Yes [provider]  escitalopram (LEXAPRO) 20 MG tablet Take 20 mg by  mouth daily.   Yes [provider]  fluticasone (FLONASE) 50 MCG/ACT nasal spray Place 2 sprays into both nostrils daily as needed for allergies.  07/02/19  Yes [provider]  furosemide (LASIX) 20 MG tablet Take 20 mg by mouth daily. 09/23/19  Yes [provider]  gabapentin (NEURONTIN) 300 MG capsule Take 300 mg by mouth 2 (two) times daily. 07/25/19  Yes [provider]  omeprazole (PRILOSEC) 20 MG capsule Take 20 mg by mouth 2 (two) times daily before a meal.   Yes [provider]  oxybutynin (OXYTROL FOR WOMEN) 3.9 MG/24HR Place 1 patch onto the skin every 3 (three) days.   Yes [provider]  OZEMPIC, 1 MG/DOSE, 4 MG/3ML SOPN Inject 1 mg into the skin once a week. Wednesday/Thursday 06/19/21  Yes [provider]  potassium chloride (KLOR-CON) 10 MEQ tablet Take 10 mEq by mouth daily.   Yes [provider]  rosuvastatin (CRESTOR) 5 MG tablet Take 5 mg by mouth at bedtime.   Yes [provider]  Insulin Pen Needle 32G X 6 MM MISC Inject into the skin daily. 07/18/20   [provider]      Allergies    Other, Atorvastatin, Metronidazole, and Septra [sulfamethoxazole-trimethoprim]    Review of Systems   Review of Systems  Constitutional:  Negative for activity change, diaphoresis and fever.  HENT:  Negative for facial swelling and trouble swallowing.   Eyes:  Negative for pain, discharge and redness.  Respiratory:  Negative for cough and shortness of breath.   Cardiovascular:  Positive for chest pain. Negative for palpitations.  Gastrointestinal:  Negative for abdominal pain, nausea and vomiting.  Genitourinary:  Negative for dysuria and flank pain.  Musculoskeletal:  Negative for back pain and gait problem.  Skin:  Negative for pallor and rash.  Neurological:  Negative for syncope and headaches.  Psychiatric/Behavioral:  The patient is nervous/anxious.    Physical Exam Updated Vital Signs BP  122/63    Pulse 66    Temp 98.7 F (37.1 C) (Oral)    Resp 17    Ht 5\' 5"  (1.651 m)    Wt 103.9 kg    SpO2 93%    BMI 38.11 kg/m  Physical Exam Vitals and nursing note reviewed.  Constitutional:      General: She is not in acute distress.    Appearance: Normal appearance.  HENT:     Head: Normocephalic and atraumatic.     Right Ear: External ear normal.     Left Ear: External ear normal.     Nose: Nose normal.     Mouth/Throat:     Mouth: Mucous membranes are moist.  Eyes:     General: No scleral icterus.       Right eye: No discharge.        Left eye: No discharge.  Cardiovascular:     Rate and Rhythm: Normal rate and regular rhythm.     Pulses: Normal pulses.          Radial pulses are 2+ on the right side and 2+ on the left side.       Dorsalis pedis pulses are 2+ on the right side and 2+ on the left side.     Heart sounds: Normal heart sounds. No murmur heard. Pulmonary:     Effort: Pulmonary effort is normal. No respiratory distress.     Breath sounds: Normal breath sounds.  Abdominal:     General: Abdomen is flat. There is no distension.     Tenderness: There is no abdominal tenderness.  Musculoskeletal:        General: Normal range of motion.     Cervical back: Normal range of motion. No rigidity.     Right lower leg: Edema (trace) present.     Left lower leg: Edema (trace) present.  Skin:    General: Skin is warm and dry.     Capillary Refill: Capillary refill takes less than 2 seconds.  Neurological:     Mental Status: She is alert.  Psychiatric:        Mood and Affect: Mood normal.        Behavior: Behavior normal.    ED Results / Procedures / Treatments   Labs (all labs ordered are listed, but only abnormal results are displayed) Labs Reviewed  CBC - Abnormal; Notable for the following components:      Result Value   WBC 3.3 (*)    Platelets 146 (*)    All other components within normal limits  BASIC METABOLIC PANEL - Abnormal; Notable for the  following components:   Glucose, Bld 114 (*)    Calcium 8.7 (*)    All other components within normal limits  RESP PANEL BY RT-PCR (FLU A&B, COVID) ARPGX2  D-DIMER, QUANTITATIVE  BRAIN NATRIURETIC PEPTIDE  TROPONIN I (HIGH SENSITIVITY)  TROPONIN I (HIGH SENSITIVITY)    EKG EKG Interpretation  Date/Time:  Monday November 16 2021 18:36:16 EST Ventricular Rate:  74 PR Interval:  187 QRS Duration: 104 QT Interval:  428 QTC Calculation: 475 R Axis:   -19 Text Interpretation: Sinus rhythm Incomplete RBBB and LAFB Low voltage, precordial leads Abnormal R-wave progression, late transition similar to prior tracing Confirmed by Wynona Dove ([id number]) on 11/17/2021 3:18:06 AM  Radiology DG Chest 1 View  Result Date: 11/17/2021 CLINICAL DATA:  Abnormality on prior chest x-ray EXAM: CHEST  1 VIEW COMPARISON:  11/16/2021 FINDINGS: Cardiomegaly with vascular congestion. Previously seen nodular density at the right lung base no longer visualized. No confluent opacities or effusions. No acute bony abnormality. IMPRESSION: Cardiomegaly, vascular congestion. Electronically Signed   By: Rolm Baptise M.D.   On: 11/17/2021 03:44   DG Chest 2 View  Result Date: 11/16/2021 CLINICAL DATA:  chest pain EXAM: CHEST - 2 VIEW.  Patient is slightly rotated on the frontal view. COMPARISON:  None. FINDINGS: The heart and mediastinal contours are unchanged. Aortic calcification. Query rounded pericentimeter density overlying the right lower lobe. No focal consolidation. No pulmonary edema. No pleural effusion. No pneumothorax. No acute osseous abnormality. IMPRESSION: Query rounded pericentimeter density overlying the right lower lobe. Given slight patient rotation, recommend repeat PA chest x-ray for further evaluation. Electronically Signed   By: Iven Finn M.D.   On: 11/16/2021 19:27    Procedures Procedures    Medications Ordered in ED Medications  famotidine (PEPCID) IVPB 20 mg premix (0 mg Intravenous  Stopped 11/17/21 0409)  alum & mag hydroxide-simeth (MAALOX/MYLANTA) 200-200-20 MG/5ML suspension 30 mL (30 mLs Oral Given 11/17/21 0340)    And  lidocaine (XYLOCAINE) 2 % viscous mouth solution 15 mL (15 mLs Oral Given 11/17/21 0340)  sodium chloride 0.9 % bolus 500 mL (500 mLs Intravenous New Bag/Given 11/17/21 1610)    ED Course/ Medical Decision Making/ A&P   HEAR Score: 3                       Medical Decision Making   CC: cp  This patient complains of cp; this involves an extensive number of treatment options and is a complaint that carries with it a high risk of complications and morbidity. Vital signs were reviewed. Serious etiologies considered.  Record review:   Previous records obtained and reviewed   Additional history obtained from daughter  Work up as above, notable for:  Labs & imaging results that were available during my care of the patient were reviewed by me and considered in my medical decision making.   I ordered imaging studies which included CXR and I independently visualized and interpreted imaging which showed no acute process  Low risk well's score, dimer is negative, PE is unlikely.   Cardiac monitoring reviewed by myself, NSR.    Management: Gi cocktail, ivf  Reassessment:  Pt feeling better, remains asymptomatic and symptoms have not re-occurred while in the ED, no dyspnea. Cardiac monitoring reviewed and interpreted by myself shows NSR.     The patient's chest pain is not suggestive of pulmonary embolus, cardiac ischemia, aortic dissection, pericarditis, myocarditis, pulmonary embolism, pneumothorax, pneumonia, Zoster, or esophageal perforation, or other serious etiology.  Historically not abrupt in onset, tearing or ripping, pulses symmetric. EKG nonspecific for ischemia/infarction. No dysrhythmias, brugada, WPW, prolonged QT noted. Troponin negative x2. CXR reviewed. Labs without demonstration of acute pathology  unless otherwise noted above. Low  HEART Score: 0-3 points (0.9-1.7% risk of MACE). Given the extremely low risk of these diagnoses further testing and evaluation for these possibilities does not appear to be indicated at this time. Patient in no distress and overall condition improved here in the ED. Detailed discussions were had with the patient regarding current findings, and need for close f/u with PCP or on call doctor. The patient has been instructed to return immediately if the symptoms worsen in any way for re-evaluation. Patient verbalized understanding and is in agreement with current care plan. All questions answered prior to discharge.     This chart was dictated using voice recognition software.  Despite best efforts to proofread,  errors can occur which can change the documentation meaning. Final Clinical Impression(s) / ED Diagnoses Final diagnoses:  Atypical chest pain    Rx / DC Orders ED Discharge Orders     None         Jeanell Sparrow, DO 11/17/21 0505

## 2021-11-20 ENCOUNTER — Encounter: Payer: Self-pay | Admitting: General Surgery

## 2021-11-25 DIAGNOSIS — R000 Tachycardia, unspecified: Secondary | ICD-10-CM | POA: Diagnosis not present

## 2021-11-25 DIAGNOSIS — I5031 Acute diastolic (congestive) heart failure: Secondary | ICD-10-CM | POA: Diagnosis not present

## 2021-12-03 ENCOUNTER — Other Ambulatory Visit: Payer: Self-pay | Admitting: Family Medicine

## 2021-12-03 DIAGNOSIS — E2839 Other primary ovarian failure: Secondary | ICD-10-CM

## 2021-12-04 NOTE — Progress Notes (Signed)
Referring-Sharon Stephanie Acre MD Reason for referral-chest pain and dyspnea  HPI: 76 year old female for evaluation of chest pain and dyspnea at request of Jonathon Jordan, MD.  Echocardiogram October 2020 showed ejection fraction 65 to 70%, mild left ventricular hypertrophy and mild tricuspid regurgitation; mildly dilated aortic root at 42 mm.  Patient seen in the emergency room January 2023 with complaints of chest pain and palpitations.  Chest x-ray showed cardiomegaly with vascular congestion.  Laboratory showed creatinine 0.74, hemoglobin 12.5, normal BNP, D-dimer and troponins.  Patient states that on the morning she was seen in the emergency room she developed substernal chest pain radiating to her jaws.  It was described as a pressure.  Not pleuritic or positional.  The pain lasted for 2 hours and resolve spontaneously.  There was associated dyspnea but no nausea or diaphoresis.  She also occasionally feels her heart race in the morning and in the evening.  She has not had syncope.  She has chronic dyspnea on exertion and ankle edema but denies orthopnea.  Cardiology now asked to evaluate.  Current Outpatient Medications  Medication Sig Dispense Refill   acetaminophen (TYLENOL) 325 MG tablet Take 2 tablets (650 mg total) by mouth every 6 (six) hours as needed. (Patient taking differently: Take 650 mg by mouth every 6 (six) hours as needed for mild pain.)     carvedilol (COREG) 3.125 MG tablet 1 tablet with food     Cholecalciferol (VITAMIN D) 50 MCG (2000 UT) tablet Take 2,000 Units by mouth daily.      cyanocobalamin 1000 MCG tablet Take 1,000 mcg by mouth daily.      escitalopram (LEXAPRO) 20 MG tablet Take 20 mg by mouth daily.     fluticasone (FLONASE) 50 MCG/ACT nasal spray Place 2 sprays into both nostrils daily as needed for allergies.      furosemide (LASIX) 20 MG tablet Take 20 mg by mouth daily. TOTAL OF 40 MG     gabapentin (NEURONTIN) 300 MG capsule Take 300 mg by mouth 2 (two)  times daily.     omeprazole (PRILOSEC) 20 MG capsule Take 20 mg by mouth 2 (two) times daily before a meal.     oxybutynin (OXYTROL FOR WOMEN) 3.9 MG/24HR Place 1 patch onto the skin every 3 (three) days.     OZEMPIC, 1 MG/DOSE, 4 MG/3ML SOPN Inject 1 mg into the skin once a week. Wednesday/Thursday     potassium chloride (KLOR-CON) 10 MEQ tablet Take 10 mEq by mouth daily.     rosuvastatin (CRESTOR) 5 MG tablet Take 5 mg by mouth at bedtime.     Insulin Pen Needle 32G X 6 MM MISC Inject into the skin daily. (Patient not taking: Reported on 12/08/2021)     No current facility-administered medications for this visit.    Allergies  Allergen Reactions   Other     Refuses whole blood but does accept albumin   Atorvastatin Other (See Comments)   Metronidazole Other (See Comments)    Chest pain, A-fib    Septra [Sulfamethoxazole-Trimethoprim] Other (See Comments)    Blisters      Past Medical History:  Diagnosis Date   Anxiety    Arthritis    knee, hip   Cellulitis 08/2016   left leg   Complication of anesthesia    Diabetes mellitus without complication (HCC)    GERD (gastroesophageal reflux disease)    Heart murmur    "slight "  informed years ago.-    Hernia,  umbilical    History of atrial fibrillation 2007   with flagyl   History of endometriosis    Hypercholesteremia    Hypertension    has not been to a cardiologist   PONV (postoperative nausea and vomiting)    Rectal vaginal fistula    Refusal of blood transfusions as patient is Jehovah's Witness    Sleep apnea     Past Surgical History:  Procedure Laterality Date   APPENDECTOMY     BUNIONECTOMY Right 01/09/2019   Procedure: RIGHT FOOT BUNION CORRECTION;  Surgeon: Erle Crocker, MD;  Location: [facility];  Service: Orthopedics;  Laterality: Right;   CHOLECYSTECTOMY N/A 03/16/2021   Procedure: LAPAROSCOPIC CHOLECYSTECTOMY;  Surgeon: Stark Klein, MD;  Location: [facility];  Service: General;  Laterality: N/A;    ENDOSCOPIC RETROGRADE CHOLANGIOPANCREATOGRAPHY (ERCP) WITH PROPOFOL N/A 03/15/2021   Procedure: ENDOSCOPIC RETROGRADE CHOLANGIOPANCREATOGRAPHY (ERCP) WITH PROPOFOL;  Surgeon: Ronnette Juniper, MD;  Location: [facility];  Service: Gastroenterology;  Laterality: N/A;   EXPLORATORY LAPAROTOMY  1973   for endometrosis   HAMMERTOE RECONSTRUCTION WITH WEIL OSTEOTOMY Right 01/09/2019   Procedure: RIGHT FOOT 2-4 HAMMERTOE CORRECTION WITH WEIL OSTEOTOMY 2ND METATARSAL, DEEP TENDON TRANSFER WITH AKIN OSTEOTOMY;  Surgeon: Erle Crocker, MD;  Location: [facility];  Service: Orthopedics;  Laterality: Right;   I & D EXTREMITY Right 06/10/2016   Procedure: IRRIGATION AND DEBRIDEMENT THUMB FLEXOR SHEATH;  Surgeon: Leanora Cover, MD;  Location: [facility];  Service: Orthopedics;  Laterality: Right;   JOINT REPLACEMENT     Left Hip   MANDIBLE SURGERY  1978   rectovaginal fistula repair  1980   REMOVAL OF STONES  03/15/2021   Procedure: REMOVAL OF STONES;  Surgeon: Ronnette Juniper, MD;  Location: [facility];  Service: Gastroenterology;;   Joan Mayans  03/15/2021   Procedure: Joan Mayans;  Surgeon: Ronnette Juniper, MD;  Location: [facility];  Service: Gastroenterology;;   TOTAL HIP ARTHROPLASTY  05/08/2012   Procedure: TOTAL HIP ARTHROPLASTY;  Surgeon: Kerin Salen, MD;  Location: [facility];  Service: Orthopedics;  Laterality: Left;   TOTAL HIP ARTHROPLASTY Right 09/18/2018   Procedure: TOTAL HIP ARTHROPLASTY ANTERIOR APPROACH;  Surgeon: Frederik Pear, MD;  Location: [facility];  Service: Orthopedics;  Laterality: Right;   TOTAL KNEE ARTHROPLASTY Right 01/24/2013   Dr Mayer Camel   TOTAL KNEE ARTHROPLASTY Right 01/24/2013   Procedure: RIGHT TOTAL KNEE ARTHROPLASTY;  Surgeon: Kerin Salen, MD;  Location: [facility];  Service: Orthopedics;  Laterality: Right;    Social History   Socioeconomic History   Marital status: Married    Spouse name: Not on file   Number of children: 1   Years of education: Not on file   Highest education level: Not  on file  Occupational History   Not on file  Tobacco Use   Smoking status: Former    Packs/day: 1.00    Years: 10.00    Pack years: 10.00    Types: Cigarettes    Quit date: 11/08/1981    Years since quitting: 40.1   Smokeless tobacco: Never  Vaping Use   Vaping Use: Never used  Substance and Sexual Activity   Alcohol use: No   Drug use: No   Sexual activity: Not on file  Other Topics Concern   Not on file  Social History Narrative   Not on file   Social Determinants of Health   Financial Resource Strain: Not on file  Food Insecurity: Not on file  Transportation Needs: Not on file  Physical Activity: Not on file  Stress: Not on file  Social Connections: Not on file  Intimate Partner Violence: Not on file    Family History  Problem Relation Age of Onset   Dementia Mother    Cancer Father    Breast cancer Neg Hx     ROS: no fevers or chills, productive cough, hemoptysis, dysphasia, odynophagia, melena, hematochezia, dysuria, hematuria, rash, seizure activity, orthopnea, PND, pedal edema, claudication. Remaining systems are negative.  Physical Exam:   Blood pressure 124/82, pulse 81, height 5\' 5"  (1.651 m), weight 234 lb 6.4 oz (106.3 kg), SpO2 98 %.  General:  Well developed/well nourished in NAD Skin warm/dry Patient not depressed No peripheral clubbing Back-normal HEENT-normal/normal eyelids Neck supple/normal carotid upstroke bilaterally; no bruits; no JVD; no thyromegaly chest - CTA/ normal expansion CV - RRR/normal S1 and S2; no murmurs, rubs or gallops;  PMI nondisplaced Abdomen -NT/ND, no HSM, no mass, + bowel sounds, no bruit 2+ femoral pulses, no bruits Ext-no edema, chords, 2+ DP Neuro-grossly nonfocal  ECG -November 16, 2021-normal sinus rhythm, left ventricular hypertrophy, nonspecific ST changes.  Today's electrocardiogram shows sinus rhythm at a rate of 81, left ventricular hypertrophy, nonspecific ST changes.  Personally reviewed  A/P  1 chest  pain-symptoms with both typical and atypical features.  She has risk factors of diabetes mellitus.  We will plan CTA to rule out obstructive coronary disease.  2 palpitations-etiology unclear.  We will arrange a 7-day Zio patch to further assess.  3 history of dilated aortic root-cardiac CTA will also assess thoracic aorta.  4 remote history of atrial fibrillation-related to Flagyl by report.  No recurrences.  We will arrange a monitor to further assess her palpitations.  5 hypertension-blood pressure controlled.  Continue present medications.  6 hyperlipidemia-continue statin.  7 history of sleep apnea-follow-up primary care.  Kirk Ruths, MD

## 2021-12-08 ENCOUNTER — Ambulatory Visit (INDEPENDENT_AMBULATORY_CARE_PROVIDER_SITE_OTHER): Payer: Medicare Other

## 2021-12-08 ENCOUNTER — Encounter: Payer: Self-pay | Admitting: Cardiology

## 2021-12-08 ENCOUNTER — Other Ambulatory Visit: Payer: Self-pay

## 2021-12-08 ENCOUNTER — Ambulatory Visit: Payer: Medicare Other | Admitting: Cardiology

## 2021-12-08 VITALS — BP 124/82 | HR 81 | Ht 65.0 in | Wt 234.4 lb

## 2021-12-08 DIAGNOSIS — I10 Essential (primary) hypertension: Secondary | ICD-10-CM | POA: Diagnosis not present

## 2021-12-08 DIAGNOSIS — R002 Palpitations: Secondary | ICD-10-CM | POA: Diagnosis not present

## 2021-12-08 DIAGNOSIS — R072 Precordial pain: Secondary | ICD-10-CM

## 2021-12-08 DIAGNOSIS — E7800 Pure hypercholesterolemia, unspecified: Secondary | ICD-10-CM

## 2021-12-08 DIAGNOSIS — I77810 Thoracic aortic ectasia: Secondary | ICD-10-CM | POA: Diagnosis not present

## 2021-12-08 MED ORDER — METOPROLOL TARTRATE 100 MG PO TABS: ORAL_TABLET | ORAL | 0 refills | Status: DC

## 2021-12-08 NOTE — Progress Notes (Unsigned)
Enrolled for [employer] to mail a ZIO XT long term holter monitor to the patients address on file.  

## 2021-12-08 NOTE — Patient Instructions (Addendum)
Testing/Procedures:  Your cardiac CT will be scheduled at   [facility] [place], [place] [address] [phone]   If scheduled at [facility], please arrive at the [facility] main entrance (entrance A) of [facility] 30 minutes prior to test start time. You can use the FREE valet parking offered at the main entrance (encouraged to control the heart rate for the test) Proceed to the [facility] Radiology Department (first floor) to check-in and test prep.    Please follow these instructions carefully (unless otherwise directed):   On the Night Before the Test: Be sure to Drink plenty of water. Do not consume any caffeinated/decaffeinated beverages or chocolate 12 hours prior to your test. Do not take any antihistamines 12 hours prior to your test.   On the Day of the Test: Drink plenty of water until 1 hour prior to the test. Do not eat any food 4 hours prior to the test. You may take your regular medications prior to the test.  Take metoprolol (Lopressor) 100 mg two hours prior to test. HOLD Furosemide/Hydrochlorothiazide morning of the test. FEMALES- please wear underwire-free bra if available, avoid dresses & tight clothing       After the Test: Drink plenty of water. After receiving IV contrast, you may experience a mild flushed feeling. This is normal. On occasion, you may experience a mild rash up to 24 hours after the test. This is not dangerous. If this occurs, you can take Benadryl 25 mg and increase your fluid intake. If you experience trouble breathing, this can be serious. If it is severe call 911 IMMEDIATELY. If it is mild, please call our office. If you take any of these medications: Glipizide/Metformin, Avandament, Glucavance, please do not take 48 hours after completing test unless otherwise instructed.  We will call to schedule your test 2-4 weeks out understanding that some insurance companies will need an  authorization prior to the service being performed.   For non-scheduling related questions, please contact the cardiac imaging nurse navigator should you have any questions/concerns: Marchia Bond, Cardiac Imaging Nurse Navigator Gordy Clement, Cardiac Imaging Nurse Navigator [facility] Direct Office Dial: [phone]   For scheduling needs, including cancellations and rescheduling, please call [place], [phone].    ZIO XT- Long Term Monitor Instructions  Your physician has requested you wear a ZIO patch monitor for 7 days.  This is a single patch monitor. [employer] supplies one patch monitor per enrollment. Additional stickers are not available. Please do not apply patch if you will be having a Nuclear Stress Test,  Echocardiogram, Cardiac CT, MRI, or Chest Xray during the period you would be wearing the  monitor. The patch cannot be worn during these tests. You cannot remove and re-apply the  ZIO XT patch monitor.  Your ZIO patch monitor will be mailed 3 day USPS to your address on file. It may take 3-5 days  to receive your monitor after you have been enrolled.  Once you have received your monitor, please review the enclosed instructions. Your monitor  has already been registered assigning a specific monitor serial # to you.  Billing and Patient Assistance Program Information  We have supplied [employer] with any of your insurance information on file for billing purposes. [employer] offers a sliding scale Patient Assistance Program for patients that do not have  insurance, or whose insurance does not completely cover the cost of the ZIO monitor.  You must apply  for the Patient Assistance Program to qualify for this discounted rate.  To apply, please call [employer] at [phone], select option 4, select option 2, ask to apply for  Patient Assistance Program. Theodore Demark will ask your household income, and how many people  are in your household. They will quote  your out-of-pocket cost based on that information.  [employer] will also be able to set up a 58-month, interest-free payment plan if needed.  Applying the monitor   Shave hair from upper left chest.  Hold abrader disc by orange tab. Rub abrader in 40 strokes over the upper left chest as  indicated in your monitor instructions.  Clean area with 4 enclosed alcohol pads. Let dry.  Apply patch as indicated in monitor instructions. Patch will be placed under collarbone on left  side of chest with arrow pointing upward.  Rub patch adhesive wings for 2 minutes. Remove white label marked "1". Remove the white  label marked "2". Rub patch adhesive wings for 2 additional minutes.  While looking in a mirror, press and release button in center of patch. A small green light will  flash 3-4 times. This will be your only indicator that the monitor has been turned on.  Do not shower for the first 24 hours. You may shower after the first 24 hours.  Press the button if you feel a symptom. You will hear a small click. Record Date, Time and  Symptom in the Patient Logbook.  When you are ready to remove the patch, follow instructions on the last 2 pages of Patient  Logbook. Stick patch monitor onto the last page of Patient Logbook.  Place Patient Logbook in the blue and white box. Use locking tab on box and tape box closed  securely. The blue and white box has prepaid postage on it. Please place it in the mailbox as  soon as possible. Your physician should have your test results approximately 7 days after the  monitor has been mailed back to [facility].  Call [place] at [phone] if you have questions regarding  your ZIO XT patch monitor. Call them immediately if you see an orange light blinking on your  monitor.  If your monitor falls off in less than 4 days, contact our Monitor department at [phone].  If your monitor becomes loose or falls off after 4 days call [employer] at  [phone] for  suggestions on securing your monitor    Follow-Up: At [facility], you and your health needs are our priority.  As part of our continuing mission to provide you with exceptional heart care, we have created designated Provider Care Teams.  These Care Teams include your primary Cardiologist (physician) and Advanced Practice Providers (APPs -  Physician Assistants and Nurse Practitioners) who all work together to provide you with the care you need, when you need it.  We recommend signing up for the patient portal called "MyChart".  Sign up information is provided on this After Visit Summary.  MyChart is used to connect with patients for Virtual Visits (Telemedicine).  Patients are able to view lab/test results, encounter notes, upcoming appointments, etc.  Non-urgent messages can be sent to your provider as well.   To learn more about what you can do with MyChart, go to [email or web address].    Your next appointment:   6 month(s)  The format for your next appointment:   In Person  Provider:   Kirk Ruths MD   :1}

## 2021-12-09 DIAGNOSIS — I712 Thoracic aortic aneurysm, without rupture, unspecified: Secondary | ICD-10-CM | POA: Diagnosis not present

## 2021-12-09 DIAGNOSIS — D700 Congenital agranulocytosis: Secondary | ICD-10-CM | POA: Diagnosis not present

## 2021-12-09 DIAGNOSIS — K7460 Unspecified cirrhosis of liver: Secondary | ICD-10-CM | POA: Diagnosis not present

## 2021-12-09 DIAGNOSIS — I5031 Acute diastolic (congestive) heart failure: Secondary | ICD-10-CM | POA: Diagnosis not present

## 2021-12-09 DIAGNOSIS — D692 Other nonthrombocytopenic purpura: Secondary | ICD-10-CM | POA: Diagnosis not present

## 2021-12-09 DIAGNOSIS — I700 Atherosclerosis of aorta: Secondary | ICD-10-CM | POA: Diagnosis not present

## 2021-12-10 DIAGNOSIS — R002 Palpitations: Secondary | ICD-10-CM | POA: Diagnosis not present

## 2021-12-16 ENCOUNTER — Other Ambulatory Visit: Payer: Self-pay | Admitting: Cardiology

## 2021-12-16 ENCOUNTER — Other Ambulatory Visit (HOSPITAL_COMMUNITY): Payer: Self-pay | Admitting: Cardiology

## 2021-12-16 DIAGNOSIS — I77810 Thoracic aortic ectasia: Secondary | ICD-10-CM

## 2021-12-17 ENCOUNTER — Telehealth (HOSPITAL_COMMUNITY): Payer: Self-pay | Admitting: Emergency Medicine

## 2021-12-17 NOTE — Telephone Encounter (Signed)
Attempted to call patient regarding upcoming cardiac CT appointment. °Left message on voicemail with name and callback number °Pratyush Ammon RN Navigator Cardiac Imaging °[facility] °[phone] Office °[phone] Cell ° °

## 2021-12-17 NOTE — Telephone Encounter (Signed)
Reaching out to patient to offer assistance regarding upcoming cardiac imaging study; pt verbalizes understanding of appt date/time, parking situation and where to check in, pre-test NPO status and medications ordered, and verified current allergies; name and call back number provided for further questions should they arise Marchia Bond RN Navigator Cardiac Imaging [facility] [phone] office [phone] cell   Denies iv issues Zio patch comes off tomorrow 100mg  metoprolol tartrate Arrival 1030

## 2021-12-21 ENCOUNTER — Other Ambulatory Visit: Payer: Self-pay

## 2021-12-21 ENCOUNTER — Encounter (HOSPITAL_COMMUNITY): Payer: Self-pay

## 2021-12-21 ENCOUNTER — Ambulatory Visit (HOSPITAL_COMMUNITY)
Admission: RE | Admit: 2021-12-21 | Discharge: 2021-12-21 | Disposition: A | Discharge status: Home / Self Care | Payer: Medicare Other | Source: Ambulatory Visit | Attending: Cardiology | Admitting: Cardiology

## 2021-12-21 ENCOUNTER — Telehealth: Payer: Self-pay | Admitting: *Deleted

## 2021-12-21 ENCOUNTER — Encounter: Payer: Self-pay | Admitting: *Deleted

## 2021-12-21 DIAGNOSIS — R072 Precordial pain: Secondary | ICD-10-CM

## 2021-12-21 DIAGNOSIS — I77810 Thoracic aortic ectasia: Secondary | ICD-10-CM | POA: Insufficient documentation

## 2021-12-21 MED ORDER — METOPROLOL TARTRATE 5 MG/5ML IV SOLN
5.0000 mg | INTRAVENOUS | Status: DC | PRN
  Administered 2021-12-21: 5 mg via INTRAVENOUS

## 2021-12-21 MED ORDER — NITROGLYCERIN 0.4 MG SL SUBL
SUBLINGUAL_TABLET | SUBLINGUAL | Status: AC
  Filled 2021-12-21: qty 2

## 2021-12-21 MED ORDER — DILTIAZEM HCL 25 MG/5ML IV SOLN: 5.0000 mg | INTRAVENOUS | Status: DC | PRN

## 2021-12-21 MED ORDER — METOPROLOL TARTRATE 5 MG/5ML IV SOLN
INTRAVENOUS | Status: AC
  Filled 2021-12-21: qty 10

## 2021-12-21 MED ORDER — DILTIAZEM HCL 25 MG/5ML IV SOLN
INTRAVENOUS | Status: AC
  Administered 2021-12-21: 5 mg via INTRAVENOUS
  Filled 2021-12-21: qty 5

## 2021-12-21 MED ORDER — NITROGLYCERIN 0.4 MG SL SUBL: 0.8000 mg | SUBLINGUAL_TABLET | Freq: Once | SUBLINGUAL | Status: DC

## 2021-12-21 NOTE — Progress Notes (Signed)
Pt's heart rhythm irregular with PAC's every other beat. PRN metoprolol and diltiazem administered with no change in rhythm. Dr. Debara Pickett notified. Per MD, cancel procedure at this time. Legrand Como CT supervisor aware and [place] CT navigator notified. IV removed and pt discharged. No s/s of distress at this time.

## 2021-12-21 NOTE — Telephone Encounter (Signed)
Spoke with pt, aware of dr Jacalyn Lefevre recommendations. Follow up scheduled for lexiscan.

## 2021-12-21 NOTE — Telephone Encounter (Signed)
-----   Message from Lelon Perla, MD sent at 12/21/2021  4:16 PM EST ----- Arrange lexiscan nuclear study Kirk Ruths  ----- Message ----- From: Eli Hose, RN Sent: 12/21/2021   1:49 PM EST To: Cristopher Estimable, RN, Lelon Perla, MD, #  Dr. Stanford Breed,  This patient came in for their scans today but we were unable to perform due to her PACs.  It sounded like she is not a good candidate for her CCTA.  Thanks, Kerin Ransom

## 2021-12-22 ENCOUNTER — Telehealth (HOSPITAL_COMMUNITY): Payer: Self-pay | Admitting: *Deleted

## 2021-12-22 NOTE — Telephone Encounter (Signed)
Patient given detailed instructions per Myocardial Perfusion Study Information Sheet for the test on  12/28/21 Patient notified to arrive 15 minutes early and that it is imperative to arrive on time for appointment to keep from having the test rescheduled.  If you need to cancel or reschedule your appointment, please call the office within 24 hours of your appointment. . Patient verbalized understanding. Kirstie Peri

## 2021-12-24 DIAGNOSIS — R002 Palpitations: Secondary | ICD-10-CM | POA: Diagnosis not present

## 2021-12-28 ENCOUNTER — Ambulatory Visit (HOSPITAL_COMMUNITY): Payer: Medicare Other | Attending: Internal Medicine

## 2021-12-28 ENCOUNTER — Other Ambulatory Visit: Payer: Self-pay

## 2021-12-28 ENCOUNTER — Telehealth: Payer: Self-pay | Admitting: *Deleted

## 2021-12-28 ENCOUNTER — Encounter: Payer: Self-pay | Admitting: *Deleted

## 2021-12-28 DIAGNOSIS — R072 Precordial pain: Secondary | ICD-10-CM | POA: Diagnosis not present

## 2021-12-28 LAB — MYOCARDIAL PERFUSION IMAGING
Base ST Depression (mm): 0 mm
LV dias vol: 107 mL (ref 46–106)
LV sys vol: 51 mL
Nuc Stress EF: 53 %
Peak HR: 93 {beats}/min
Rest HR: 74 {beats}/min
Rest Nuclear Isotope Dose: 8.3 mCi
SDS: 4
SRS: 0
SSS: 4
ST Depression (mm): 0 mm
Stress Nuclear Isotope Dose: 30.6 mCi
TID: 1.18

## 2021-12-28 MED ORDER — TECHNETIUM TC 99M TETROFOSMIN IV KIT
30.6000 | PACK | Freq: Once | INTRAVENOUS | Status: AC | PRN
  Administered 2021-12-28: 30.6 via INTRAVENOUS
  Filled 2021-12-28: qty 31

## 2021-12-28 MED ORDER — TECHNETIUM TC 99M TETROFOSMIN IV KIT
8.3000 | PACK | Freq: Once | INTRAVENOUS | Status: AC | PRN
  Administered 2021-12-28: 8.3 via INTRAVENOUS
  Filled 2021-12-28: qty 9

## 2021-12-28 MED ORDER — REGADENOSON 0.4 MG/5ML IV SOLN
0.4000 mg | Freq: Once | INTRAVENOUS | Status: AC
  Administered 2021-12-28: 0.4 mg via INTRAVENOUS

## 2021-12-28 NOTE — Telephone Encounter (Signed)
Spoke with pt, aware of monitor results. She reports stomach issues since starting the carvedilol and would like to change to a different medication . Aware dr Stanford Breed is not in the office today but will forward for his review.

## 2021-12-28 NOTE — Telephone Encounter (Signed)
-----   Message from Lelon Perla, MD sent at 12/25/2021  9:14 AM EST ----- Continue low dose coreg Kirk Ruths

## 2021-12-28 NOTE — Telephone Encounter (Signed)
This encounter was created in error - please disregard.

## 2021-12-29 MED ORDER — METOPROLOL SUCCINATE ER 25 MG PO TB24: 25.0000 mg | ORAL_TABLET | Freq: Every day | ORAL | 6 refills | Status: DC

## 2021-12-29 NOTE — Telephone Encounter (Signed)
Spoke with pt, Aware of dr crenshaw's recommendations. New script sent to the pharmacy  

## 2022-01-01 DIAGNOSIS — E785 Hyperlipidemia, unspecified: Secondary | ICD-10-CM | POA: Diagnosis not present

## 2022-01-05 DIAGNOSIS — E1122 Type 2 diabetes mellitus with diabetic chronic kidney disease: Secondary | ICD-10-CM | POA: Diagnosis not present

## 2022-01-05 DIAGNOSIS — E785 Hyperlipidemia, unspecified: Secondary | ICD-10-CM | POA: Diagnosis not present

## 2022-01-05 DIAGNOSIS — I517 Cardiomegaly: Secondary | ICD-10-CM | POA: Diagnosis not present

## 2022-01-05 DIAGNOSIS — N182 Chronic kidney disease, stage 2 (mild): Secondary | ICD-10-CM | POA: Diagnosis not present

## 2022-01-05 DIAGNOSIS — E1140 Type 2 diabetes mellitus with diabetic neuropathy, unspecified: Secondary | ICD-10-CM | POA: Diagnosis not present

## 2022-01-05 DIAGNOSIS — K7460 Unspecified cirrhosis of liver: Secondary | ICD-10-CM | POA: Diagnosis not present

## 2022-01-05 DIAGNOSIS — Z0000 Encounter for general adult medical examination without abnormal findings: Secondary | ICD-10-CM | POA: Diagnosis not present

## 2022-01-05 DIAGNOSIS — I359 Nonrheumatic aortic valve disorder, unspecified: Secondary | ICD-10-CM | POA: Diagnosis not present

## 2022-01-05 DIAGNOSIS — I700 Atherosclerosis of aorta: Secondary | ICD-10-CM | POA: Diagnosis not present

## 2022-01-05 DIAGNOSIS — I77810 Thoracic aortic ectasia: Secondary | ICD-10-CM | POA: Diagnosis not present

## 2022-01-05 DIAGNOSIS — I10 Essential (primary) hypertension: Secondary | ICD-10-CM | POA: Diagnosis not present

## 2022-01-12 DIAGNOSIS — E1122 Type 2 diabetes mellitus with diabetic chronic kidney disease: Secondary | ICD-10-CM | POA: Diagnosis not present

## 2022-01-12 DIAGNOSIS — I10 Essential (primary) hypertension: Secondary | ICD-10-CM | POA: Diagnosis not present

## 2022-02-15 ENCOUNTER — Ambulatory Visit
Admission: RE | Admit: 2022-02-15 | Discharge: 2022-02-15 | Disposition: A | Discharge status: Home / Self Care | Payer: Medicare Other | Source: Ambulatory Visit | Attending: Family Medicine | Admitting: Family Medicine

## 2022-02-15 DIAGNOSIS — E2839 Other primary ovarian failure: Secondary | ICD-10-CM

## 2022-02-15 DIAGNOSIS — Z780 Asymptomatic menopausal state: Secondary | ICD-10-CM | POA: Diagnosis not present

## 2022-02-19 ENCOUNTER — Encounter: Payer: Self-pay | Admitting: Podiatry

## 2022-02-19 ENCOUNTER — Ambulatory Visit: Payer: Medicare Other | Admitting: Podiatry

## 2022-02-19 ENCOUNTER — Ambulatory Visit (INDEPENDENT_AMBULATORY_CARE_PROVIDER_SITE_OTHER): Payer: Medicare Other

## 2022-02-19 DIAGNOSIS — L02611 Cutaneous abscess of right foot: Secondary | ICD-10-CM

## 2022-02-19 DIAGNOSIS — S9031XA Contusion of right foot, initial encounter: Secondary | ICD-10-CM

## 2022-02-19 DIAGNOSIS — L03031 Cellulitis of right toe: Secondary | ICD-10-CM

## 2022-02-22 NOTE — Progress Notes (Signed)
Subjective:  ? ?Patient ID: Kathleen Wiley, female   DOB: 76 y.o.   MRN: [id number]  ? ?HPI ?Patient presents stating that she has developed some blistering of the right second toe and thinks she may have a small abscess on it.  States it is local is not hurting her but she does have diabetes and wants it checked ? ? ?ROS ? ? ?   ?Objective:  ?Physical Exam  ?Neurovascular status unchanged from previous visits with discoloration of the distal portion second digit right with some plantar break of the tissue localized with no proximal edema erythema or drainage noted.  There is no odor emitting from the wound currently ? ?   ?Assessment:  ?Low-grade abscess of the right second digit plantar toe localized to this area with no indication of proximal pathology ? ?   ?Plan:  ?H&P educated her on condition.  Today I went ahead I anesthetized 60 mg like Marcaine mixture sterile prep done and using sterile sharp instrumentation I open the area.  I did not note any obvious pus but there was some fluid which I flushed and I cleaned up the tissue and found to be healthy underneath no indications of tendon bone or subcutaneous exposure.  I then applied sterile dressing instructed on soaks and bandage usage and daily inspections of any erythema edema or any other pathology including proximal were to occur she is to let us know immediately and if any systemic signs of infection were to occur she is to go straight to the emergency room ? ?X-ray indicates that there does not appear to be any bony pathology it appears to be soft tissue at the current time ?   ? ? ?

## 2022-03-03 DIAGNOSIS — N182 Chronic kidney disease, stage 2 (mild): Secondary | ICD-10-CM | POA: Diagnosis not present

## 2022-03-03 DIAGNOSIS — E785 Hyperlipidemia, unspecified: Secondary | ICD-10-CM | POA: Diagnosis not present

## 2022-03-03 DIAGNOSIS — I10 Essential (primary) hypertension: Secondary | ICD-10-CM | POA: Diagnosis not present

## 2022-03-03 DIAGNOSIS — E1122 Type 2 diabetes mellitus with diabetic chronic kidney disease: Secondary | ICD-10-CM | POA: Diagnosis not present

## 2022-03-03 DIAGNOSIS — K219 Gastro-esophageal reflux disease without esophagitis: Secondary | ICD-10-CM | POA: Diagnosis not present

## 2022-03-25 DIAGNOSIS — L03115 Cellulitis of right lower limb: Secondary | ICD-10-CM | POA: Diagnosis not present

## 2022-04-19 ENCOUNTER — Encounter (HOSPITAL_COMMUNITY): Payer: Self-pay

## 2022-04-19 ENCOUNTER — Other Ambulatory Visit: Payer: Self-pay

## 2022-04-19 ENCOUNTER — Emergency Department (HOSPITAL_COMMUNITY): Payer: Medicare Other

## 2022-04-19 ENCOUNTER — Inpatient Hospital Stay (HOSPITAL_COMMUNITY)
Admission: EM | Admit: 2022-04-19 | Discharge: 2022-04-27 | DRG: 853 | Disposition: A | Discharge status: Home Health | Payer: Medicare Other | Attending: Internal Medicine | Admitting: Internal Medicine

## 2022-04-19 DIAGNOSIS — Z96643 Presence of artificial hip joint, bilateral: Secondary | ICD-10-CM | POA: Diagnosis present

## 2022-04-19 DIAGNOSIS — R609 Edema, unspecified: Secondary | ICD-10-CM | POA: Diagnosis not present

## 2022-04-19 DIAGNOSIS — M7989 Other specified soft tissue disorders: Secondary | ICD-10-CM | POA: Diagnosis not present

## 2022-04-19 DIAGNOSIS — A419 Sepsis, unspecified organism: Secondary | ICD-10-CM | POA: Diagnosis not present

## 2022-04-19 DIAGNOSIS — A408 Other streptococcal sepsis: Secondary | ICD-10-CM | POA: Diagnosis not present

## 2022-04-19 DIAGNOSIS — I4891 Unspecified atrial fibrillation: Secondary | ICD-10-CM | POA: Diagnosis not present

## 2022-04-19 DIAGNOSIS — G9341 Metabolic encephalopathy: Secondary | ICD-10-CM | POA: Diagnosis not present

## 2022-04-19 DIAGNOSIS — B954 Other streptococcus as the cause of diseases classified elsewhere: Secondary | ICD-10-CM | POA: Diagnosis not present

## 2022-04-19 DIAGNOSIS — T465X6A Underdosing of other antihypertensive drugs, initial encounter: Secondary | ICD-10-CM | POA: Diagnosis not present

## 2022-04-19 DIAGNOSIS — F419 Anxiety disorder, unspecified: Secondary | ICD-10-CM | POA: Diagnosis present

## 2022-04-19 DIAGNOSIS — E7800 Pure hypercholesterolemia, unspecified: Secondary | ICD-10-CM | POA: Diagnosis not present

## 2022-04-19 DIAGNOSIS — I10 Essential (primary) hypertension: Secondary | ICD-10-CM | POA: Diagnosis not present

## 2022-04-19 DIAGNOSIS — Z20822 Contact with and (suspected) exposure to covid-19: Secondary | ICD-10-CM | POA: Diagnosis not present

## 2022-04-19 DIAGNOSIS — I5032 Chronic diastolic (congestive) heart failure: Secondary | ICD-10-CM | POA: Diagnosis not present

## 2022-04-19 DIAGNOSIS — N179 Acute kidney failure, unspecified: Secondary | ICD-10-CM | POA: Diagnosis present

## 2022-04-19 DIAGNOSIS — I110 Hypertensive heart disease with heart failure: Secondary | ICD-10-CM | POA: Diagnosis not present

## 2022-04-19 DIAGNOSIS — I878 Other specified disorders of veins: Secondary | ICD-10-CM | POA: Diagnosis not present

## 2022-04-19 DIAGNOSIS — E872 Acidosis, unspecified: Secondary | ICD-10-CM | POA: Diagnosis not present

## 2022-04-19 DIAGNOSIS — Z881 Allergy status to other antibiotic agents status: Secondary | ICD-10-CM

## 2022-04-19 DIAGNOSIS — G4730 Sleep apnea, unspecified: Secondary | ICD-10-CM | POA: Diagnosis not present

## 2022-04-19 DIAGNOSIS — B961 Klebsiella pneumoniae [K. pneumoniae] as the cause of diseases classified elsewhere: Secondary | ICD-10-CM | POA: Diagnosis present

## 2022-04-19 DIAGNOSIS — E1165 Type 2 diabetes mellitus with hyperglycemia: Secondary | ICD-10-CM | POA: Diagnosis present

## 2022-04-19 DIAGNOSIS — Z79899 Other long term (current) drug therapy: Secondary | ICD-10-CM

## 2022-04-19 DIAGNOSIS — D61818 Other pancytopenia: Secondary | ICD-10-CM | POA: Diagnosis present

## 2022-04-19 DIAGNOSIS — Z87891 Personal history of nicotine dependence: Secondary | ICD-10-CM

## 2022-04-19 DIAGNOSIS — E876 Hypokalemia: Secondary | ICD-10-CM | POA: Diagnosis not present

## 2022-04-19 DIAGNOSIS — R7881 Bacteremia: Secondary | ICD-10-CM | POA: Diagnosis not present

## 2022-04-19 DIAGNOSIS — D72819 Decreased white blood cell count, unspecified: Secondary | ICD-10-CM | POA: Diagnosis not present

## 2022-04-19 DIAGNOSIS — F32A Depression, unspecified: Secondary | ICD-10-CM | POA: Diagnosis not present

## 2022-04-19 DIAGNOSIS — I872 Venous insufficiency (chronic) (peripheral): Secondary | ICD-10-CM | POA: Diagnosis present

## 2022-04-19 DIAGNOSIS — Z9049 Acquired absence of other specified parts of digestive tract: Secondary | ICD-10-CM

## 2022-04-19 DIAGNOSIS — L309 Dermatitis, unspecified: Secondary | ICD-10-CM | POA: Diagnosis not present

## 2022-04-19 DIAGNOSIS — L02415 Cutaneous abscess of right lower limb: Secondary | ICD-10-CM | POA: Diagnosis present

## 2022-04-19 DIAGNOSIS — I517 Cardiomegaly: Secondary | ICD-10-CM | POA: Diagnosis not present

## 2022-04-19 DIAGNOSIS — E860 Dehydration: Secondary | ICD-10-CM | POA: Diagnosis present

## 2022-04-19 DIAGNOSIS — K219 Gastro-esophageal reflux disease without esophagitis: Secondary | ICD-10-CM | POA: Diagnosis present

## 2022-04-19 DIAGNOSIS — R9431 Abnormal electrocardiogram [ECG] [EKG]: Secondary | ICD-10-CM | POA: Diagnosis not present

## 2022-04-19 DIAGNOSIS — Z803 Family history of malignant neoplasm of breast: Secondary | ICD-10-CM

## 2022-04-19 DIAGNOSIS — Z7985 Long-term (current) use of injectable non-insulin antidiabetic drugs: Secondary | ICD-10-CM

## 2022-04-19 DIAGNOSIS — R600 Localized edema: Secondary | ICD-10-CM | POA: Diagnosis not present

## 2022-04-19 DIAGNOSIS — L0390 Cellulitis, unspecified: Secondary | ICD-10-CM | POA: Diagnosis present

## 2022-04-19 DIAGNOSIS — R509 Fever, unspecified: Secondary | ICD-10-CM | POA: Diagnosis not present

## 2022-04-19 DIAGNOSIS — Z888 Allergy status to other drugs, medicaments and biological substances status: Secondary | ICD-10-CM

## 2022-04-19 DIAGNOSIS — Z6841 Body Mass Index (BMI) 40.0 and over, adult: Secondary | ICD-10-CM

## 2022-04-19 DIAGNOSIS — Y92009 Unspecified place in unspecified non-institutional (private) residence as the place of occurrence of the external cause: Secondary | ICD-10-CM

## 2022-04-19 DIAGNOSIS — Z794 Long term (current) use of insulin: Secondary | ICD-10-CM

## 2022-04-19 DIAGNOSIS — L03115 Cellulitis of right lower limb: Secondary | ICD-10-CM | POA: Diagnosis present

## 2022-04-19 DIAGNOSIS — L538 Other specified erythematous conditions: Secondary | ICD-10-CM | POA: Diagnosis not present

## 2022-04-19 DIAGNOSIS — Z96651 Presence of right artificial knee joint: Secondary | ICD-10-CM | POA: Diagnosis present

## 2022-04-19 LAB — COMPREHENSIVE METABOLIC PANEL
ALT: 29 U/L (ref 0–44)
AST: 36 U/L (ref 15–41)
Albumin: 4 g/dL (ref 3.5–5.0)
Alkaline Phosphatase: 77 U/L (ref 38–126)
Anion gap: 11 (ref 5–15)
BUN: 18 mg/dL (ref 8–23)
CO2: 23 mmol/L (ref 22–32)
Calcium: 9.3 mg/dL (ref 8.9–10.3)
Chloride: 102 mmol/L (ref 98–111)
Creatinine, Ser: 1.05 mg/dL — ABNORMAL HIGH (ref 0.44–1.00)
GFR, Estimated: 56 mL/min — ABNORMAL LOW (ref >60)
Glucose, Bld: 177 mg/dL — ABNORMAL HIGH (ref 70–99)
Potassium: 3.7 mmol/L (ref 3.5–5.1)
Sodium: 136 mmol/L (ref 135–145)
Total Bilirubin: 1.9 mg/dL — ABNORMAL HIGH (ref 0.3–1.2)
Total Protein: 7.3 g/dL (ref 6.5–8.1)

## 2022-04-19 LAB — PROTIME-INR
INR: 1.2 (ref 0.8–1.2)
Prothrombin Time: 15.4 seconds — ABNORMAL HIGH (ref 11.4–15.2)

## 2022-04-19 LAB — CBC WITH DIFFERENTIAL/PLATELET
Abs Immature Granulocytes: 0.22 10*3/uL — ABNORMAL HIGH (ref 0.00–0.07)
Basophils Absolute: 0.1 10*3/uL (ref 0.0–0.1)
Basophils Relative: 2 %
Eosinophils Absolute: 0 10*3/uL (ref 0.0–0.5)
Eosinophils Relative: 0 %
HCT: 40.4 % (ref 36.0–46.0)
Hemoglobin: 13.4 g/dL (ref 12.0–15.0)
Immature Granulocytes: 5 %
Lymphocytes Relative: 7 %
Lymphs Abs: 0.3 10*3/uL — ABNORMAL LOW (ref 0.7–4.0)
MCH: 33.4 pg (ref 26.0–34.0)
MCHC: 33.2 g/dL (ref 30.0–36.0)
MCV: 100.7 fL — ABNORMAL HIGH (ref 80.0–100.0)
Monocytes Absolute: 0.2 10*3/uL (ref 0.1–1.0)
Monocytes Relative: 5 %
Neutro Abs: 3.4 10*3/uL (ref 1.7–7.7)
Neutrophils Relative %: 81 %
Platelets: UNDETERMINED 10*3/uL (ref 150–400)
RBC: 4.01 MIL/uL (ref 3.87–5.11)
RDW: 14.1 % (ref 11.5–15.5)
WBC: 4.1 10*3/uL (ref 4.0–10.5)
nRBC: 0 % (ref 0.0–0.2)

## 2022-04-19 LAB — URINALYSIS, ROUTINE W REFLEX MICROSCOPIC
Bilirubin Urine: NEGATIVE
Glucose, UA: NEGATIVE mg/dL
Hgb urine dipstick: NEGATIVE
Ketones, ur: NEGATIVE mg/dL
Leukocytes,Ua: NEGATIVE
Nitrite: NEGATIVE
Protein, ur: NEGATIVE mg/dL
Specific Gravity, Urine: 1.013 (ref 1.005–1.030)
pH: 5 (ref 5.0–8.0)

## 2022-04-19 LAB — RESP PANEL BY RT-PCR (FLU A&B, COVID) ARPGX2
Influenza A by PCR: NEGATIVE
Influenza B by PCR: NEGATIVE
SARS Coronavirus 2 by RT PCR: NEGATIVE

## 2022-04-19 LAB — LIPASE, BLOOD: Lipase: 32 U/L (ref 11–51)

## 2022-04-19 LAB — LACTIC ACID, PLASMA
Lactic Acid, Venous: 5.2 mmol/L (ref 0.5–1.9)
Lactic Acid, Venous: 6.7 mmol/L (ref 0.5–1.9)

## 2022-04-19 LAB — APTT: aPTT: 27 seconds (ref 24–36)

## 2022-04-19 IMAGING — CT CT TIBIA FIBULA *R* W/ CM
2 series · 11 of 20 positions shown, 13 images · IV contrast (agent unspecified)
Comparison: X-ray same day

CLINICAL DATA: Soft tissue infection.

EXAM:
CT OF THE LOWER RIGHT EXTREMITY WITH CONTRAST
TECHNIQUE: Multidetector CT imaging of the lower right extremity was performed
according to the standard protocol following intravenous contrast
administration.

[Series 4: rt tib/fib w/ soft tissue (person_name) · axial · 0.31mm/px · z∈[+81,+475]mm · 8 of 237 slices shown, 10 images]
[im 19/237  soft-tissue]
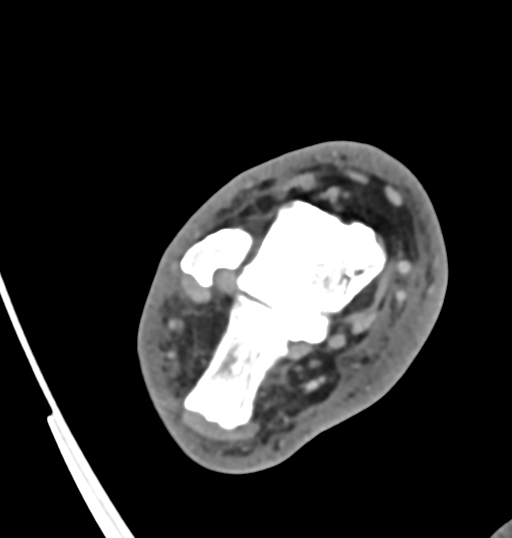
[im 19/237  bone]
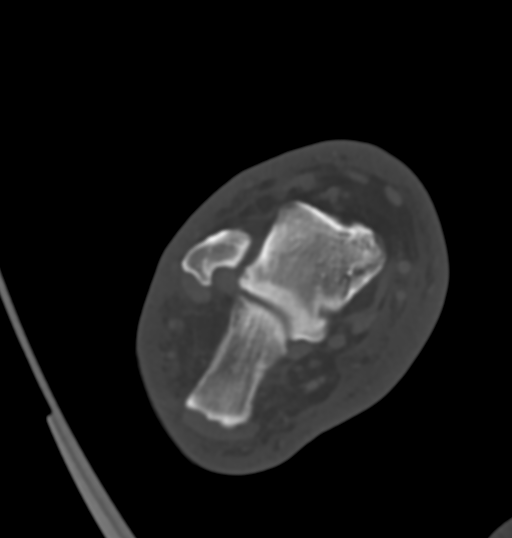
[im 55/237  bone]
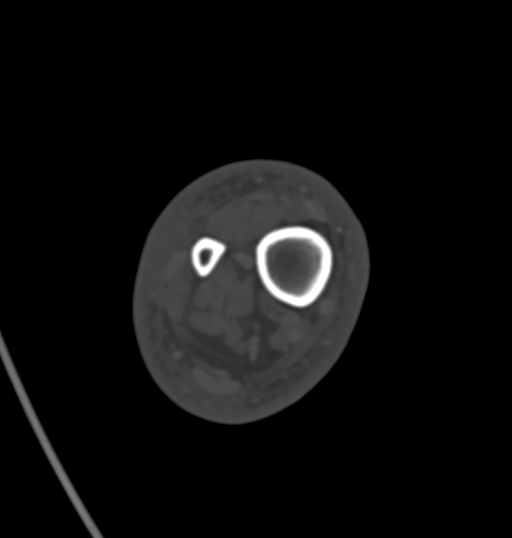
[im 73/237  bone]
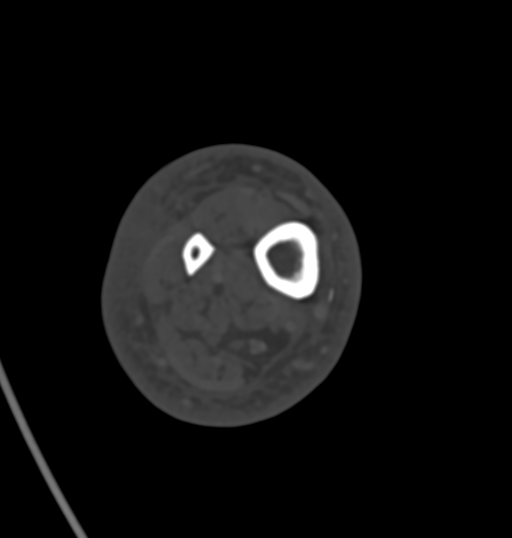
[im 109/237  bone]
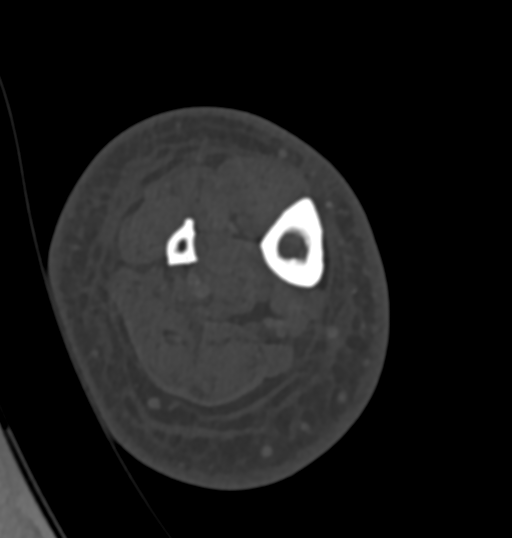
[im 128/237  soft-tissue]
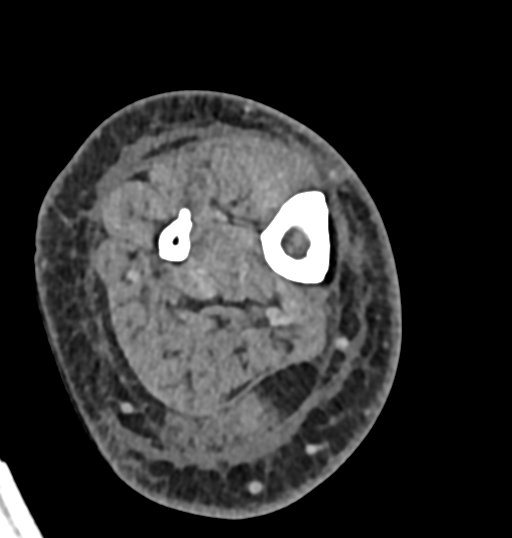
[im 128/237  bone]
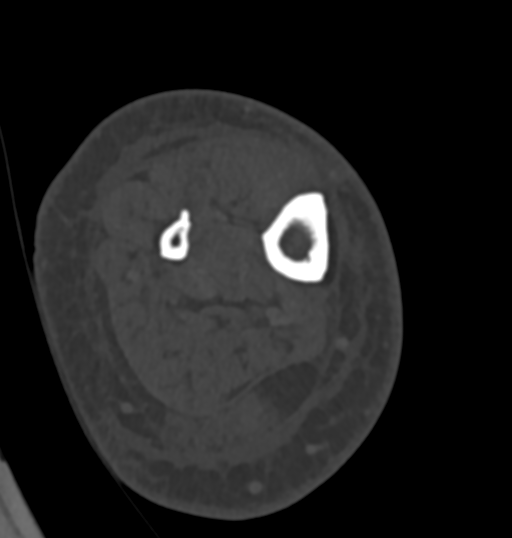
[im 164/237  bone]
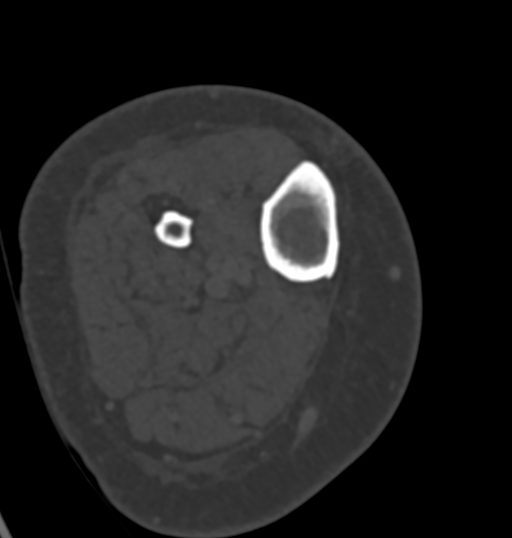
[im 182/237  bone]
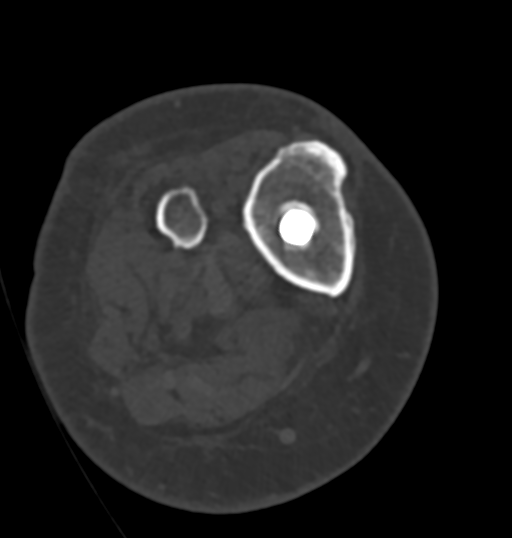
[im 218/237  bone]
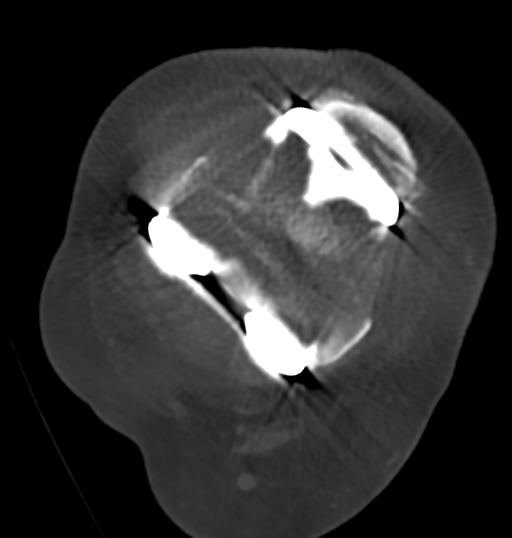

[Series 10: sag soft tissue (person_name) · coronal · 0.33mm/px · 3 of 82 slices shown]
[im 17/82  bone]
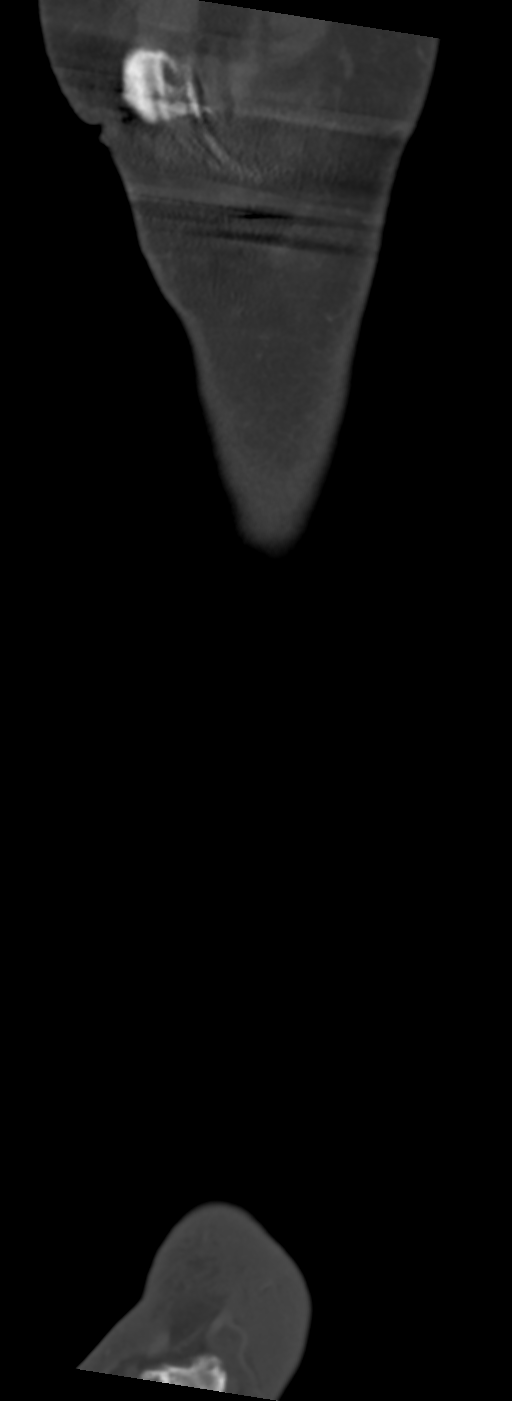
[im 33/82  bone]
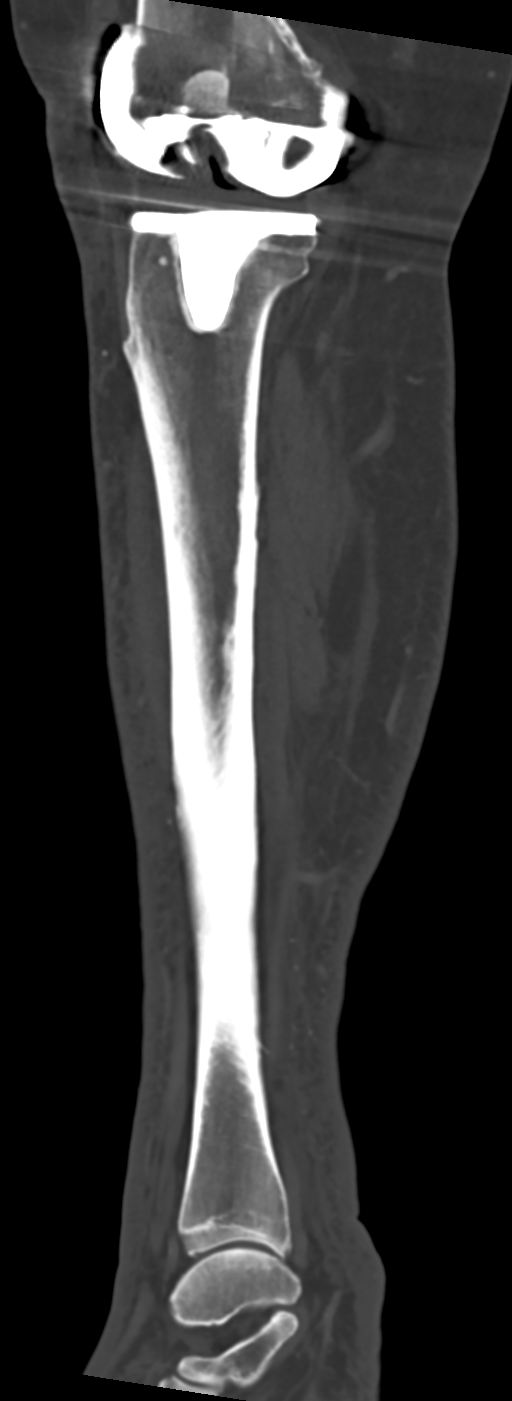
[im 49/82  bone]
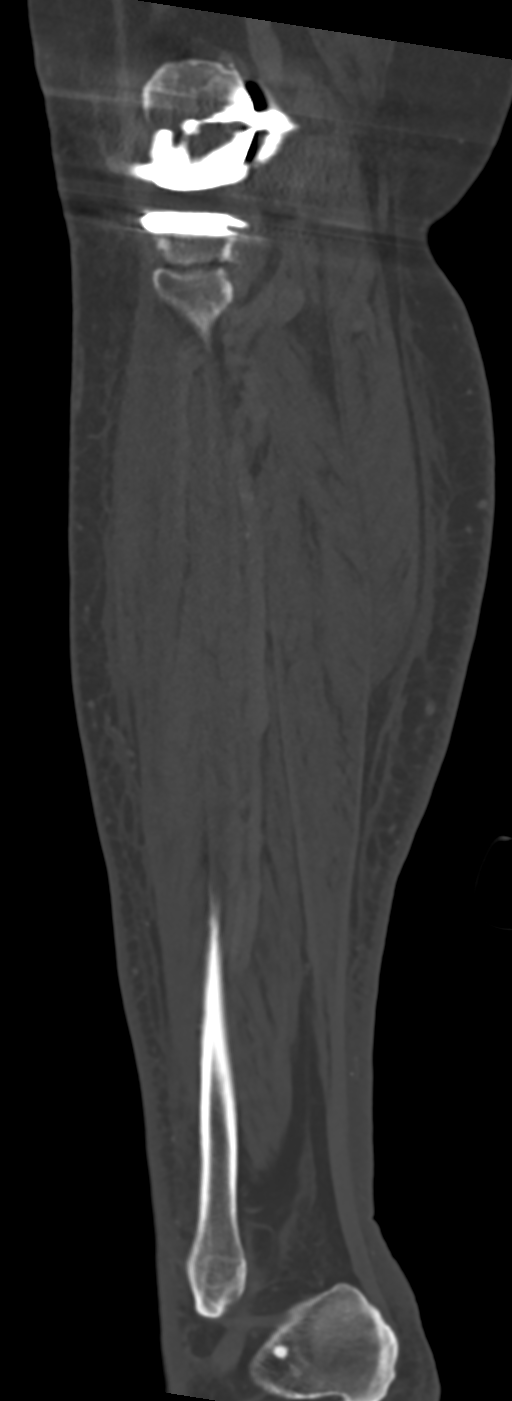

[11 of 20 positions shown; findings below may reference images not displayed]

RADIATION DOSE REDUCTION: This exam was performed according to the
departmental dose-optimization program which includes automated
exposure control, adjustment of the mA and/or kV according to
patient size and/or use of iterative reconstruction technique.

CONTRAST:  75mL OMNIPAQUE IOHEXOL 300 MG/ML  SOLN
FINDINGS: Bones/Joint/Cartilage

There is no evidence for fracture or dislocation. Total knee
arthroplasty appears in anatomic alignment without evidence for
hardware loosening. No obvious joint effusions are seen.

Ligaments

Suboptimally assessed by CT.

Muscles and Tendons

Deep fascial planes appear preserved. Muscles and tendons are
grossly within normal limits. No enhancing fluid collections.

Soft tissues

There is diffuse subcutaneous swelling and edema throughout the mid
and distal lower extremity. There is no evidence for soft tissue gas
or enhancing fluid collection. There are some nonspecific scattered
subcutaneous calcifications.
IMPRESSION: 1. Diffuse subcutaneous edema and swelling of the mid and distal
lower extremity. Findings can be seen in the setting of cellulitis.
No evidence for soft tissue gas, abscess, or deep fascial plane
involvement.
2. No acute bony abnormality.
3. Knee arthroplasty appears uncomplicated.

## 2022-04-19 IMAGING — DX DG TIBIA/FIBULA PORT 2V*R*
3 series · 3 of 3 positions shown · non-contrast
Comparison: None Available.

CLINICAL DATA: Fever, body aches

EXAM:
PORTABLE RIGHT TIBIA AND FIBULA - 2 VIEW

[tibia ap (1 of 2)]
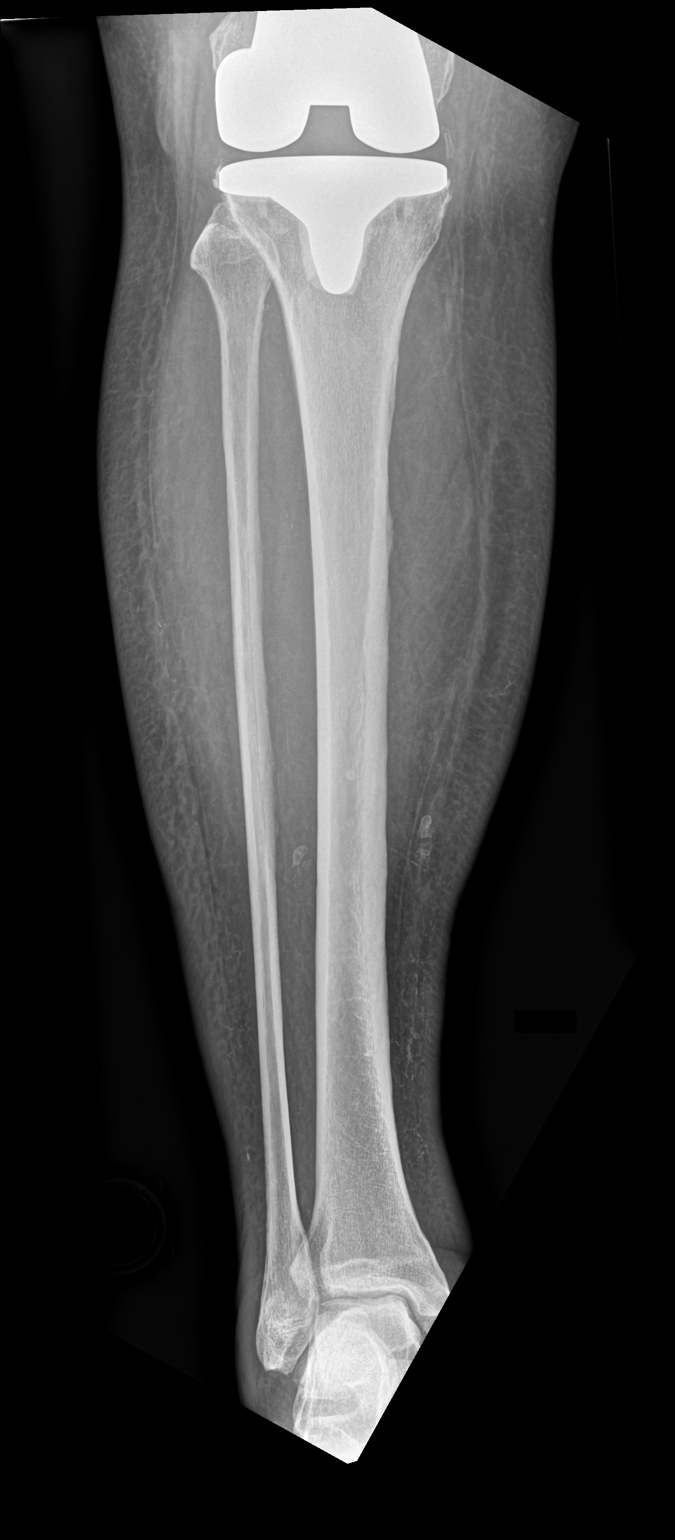

[tibia ap (2 of 2)]
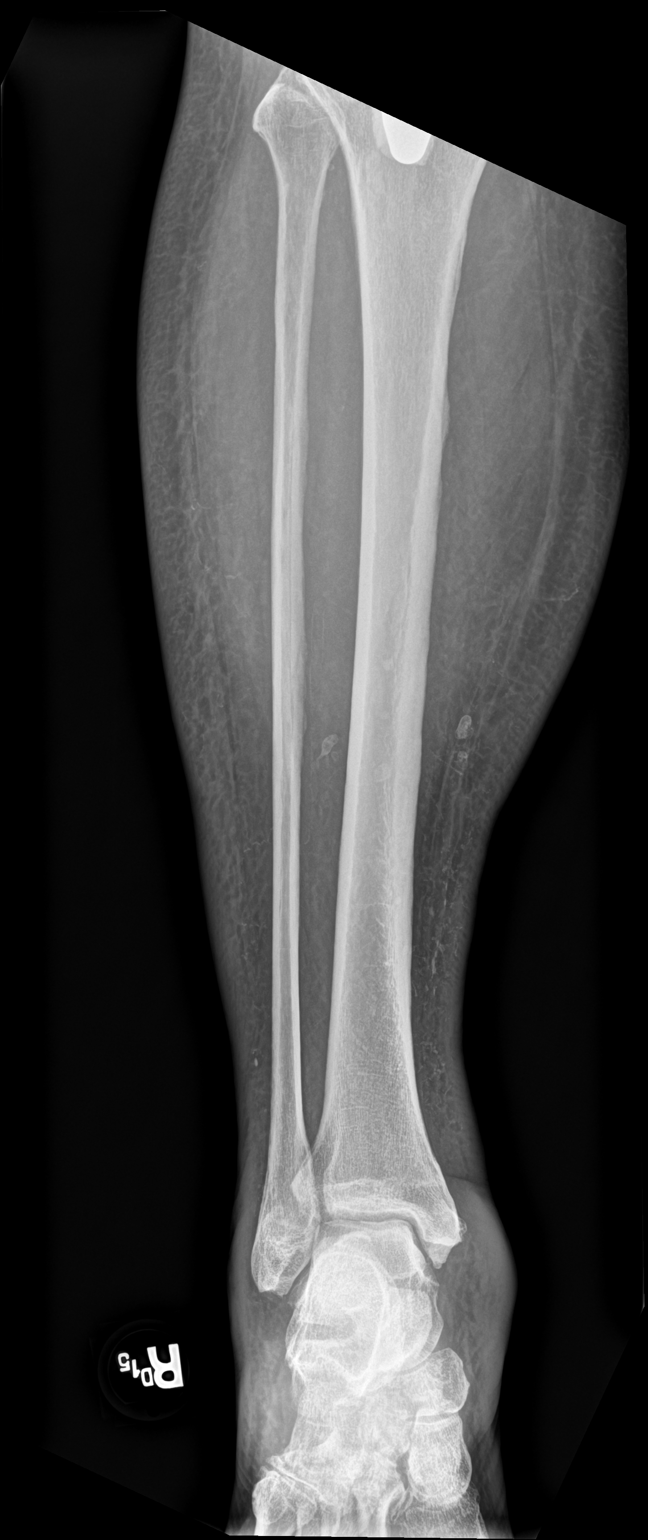

[tibia lat]
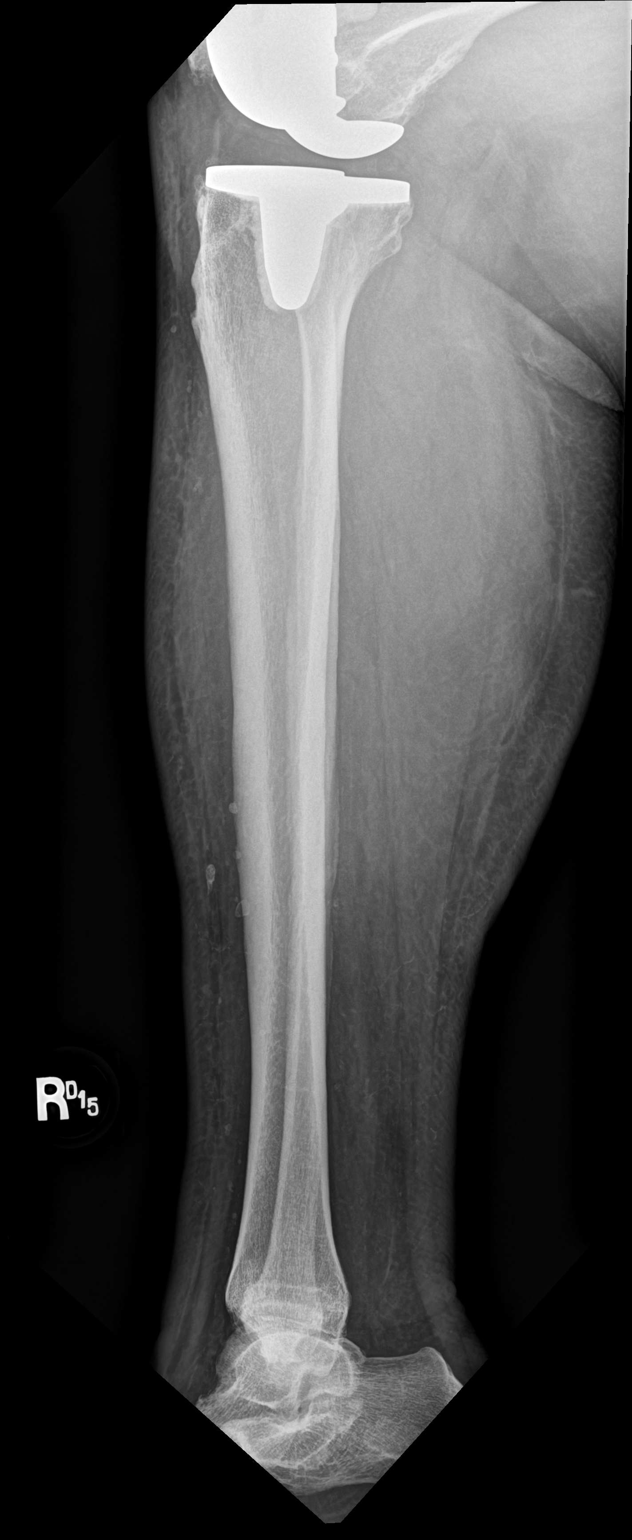

[3 of 3 positions shown; findings below may reference images not displayed]

FINDINGS: Changes of right knee replacement partially visualized. No acute
bony abnormality. Specifically, no fracture, subluxation, or
dislocation. Soft tissues are intact.
IMPRESSION: No acute bony abnormality.

## 2022-04-19 IMAGING — DX DG CHEST 1V PORT
1 series · 1 of 1 positions shown · non-contrast
Comparison: [DATE]

CLINICAL DATA: Questionable sepsis - evaluate for abnormality

EXAM:
PORTABLE CHEST 1 VIEW

[chest ap]
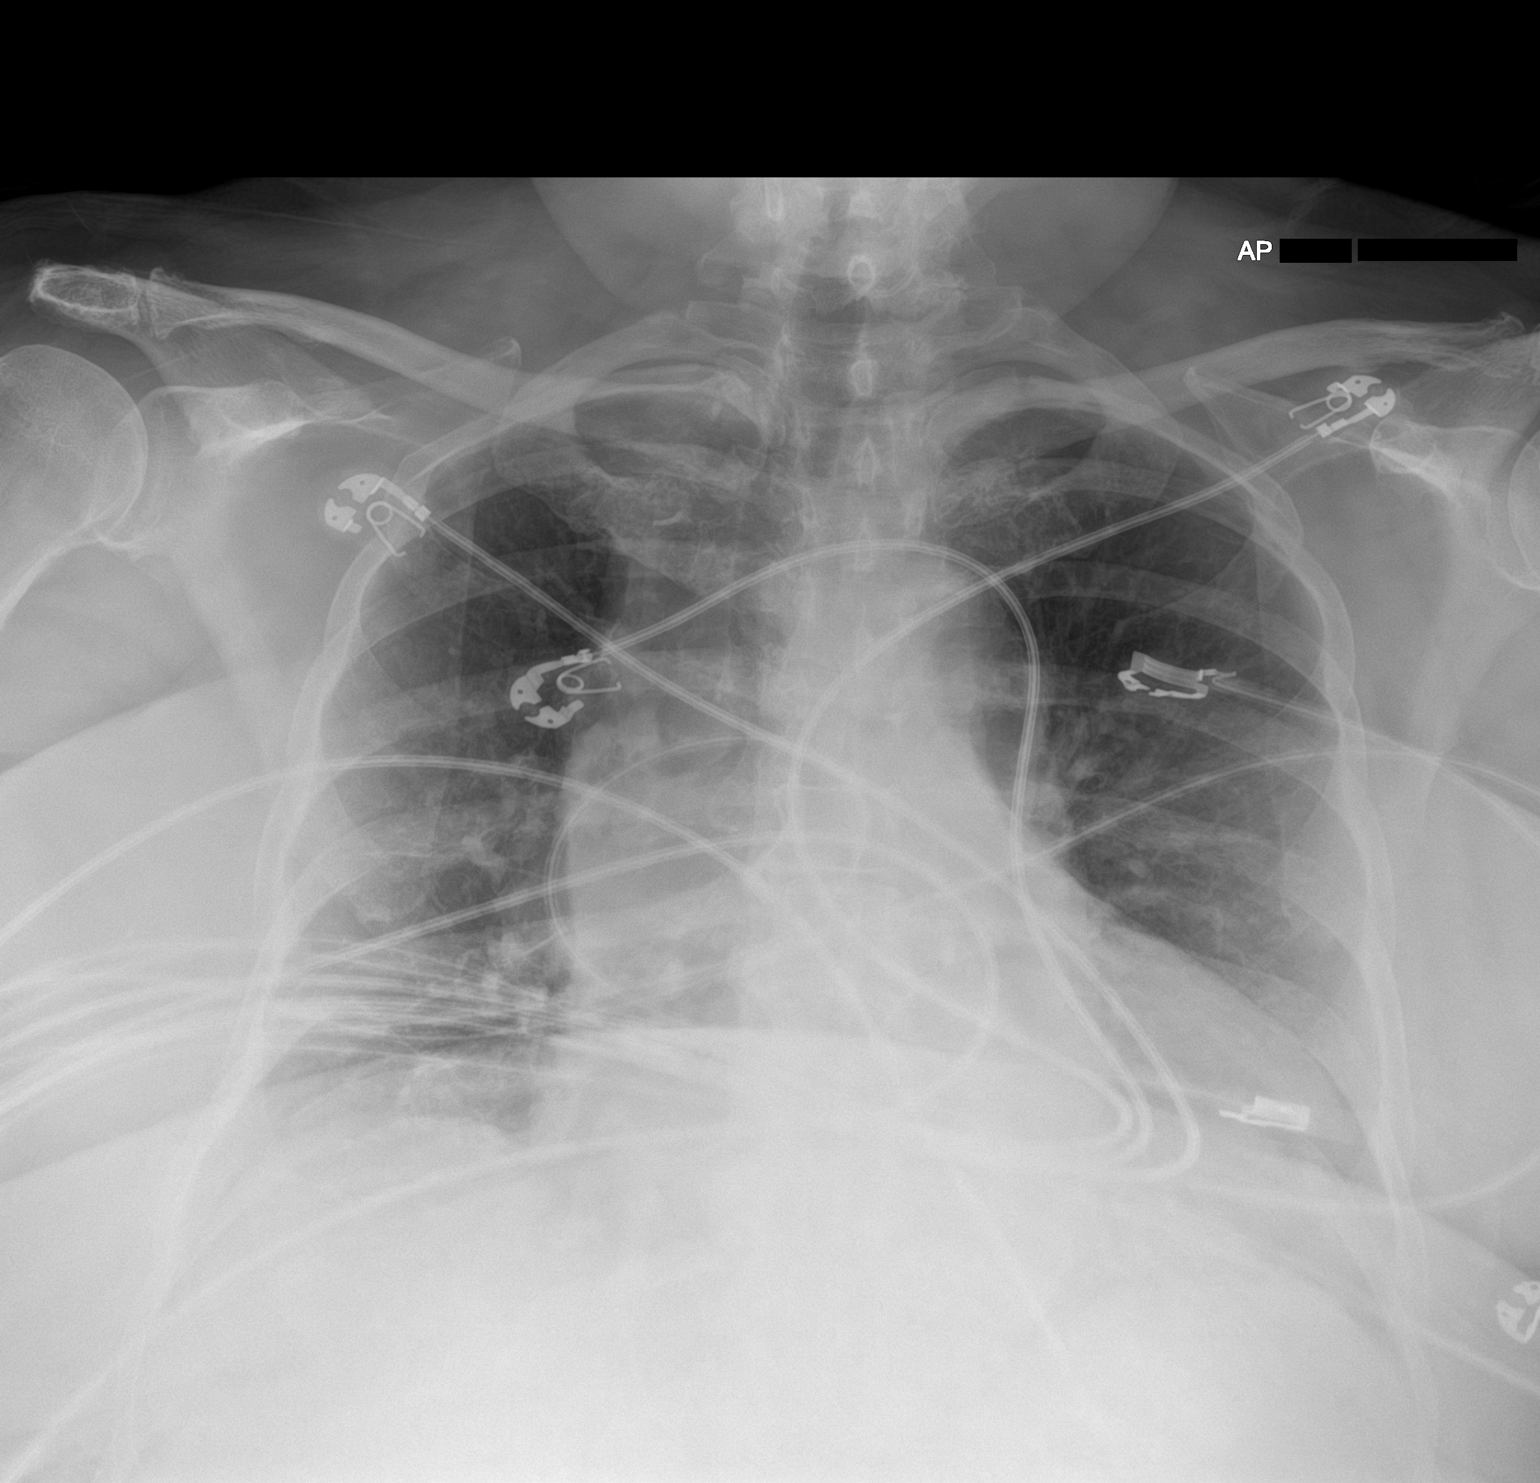

[1 of 1 positions shown; findings below may reference images not displayed]

FINDINGS: Cardiomegaly. No confluent airspace opacities, effusions or edema.
Mediastinal contours within normal limits. No acute bony
abnormality.
IMPRESSION: Cardiomegaly.  No active disease.

## 2022-04-19 MED ORDER — PIPERACILLIN-TAZOBACTAM 3.375 G IVPB 30 MIN
3.3750 g | Freq: Once | INTRAVENOUS | Status: AC
  Administered 2022-04-19: 3.375 g via INTRAVENOUS
  Filled 2022-04-19: qty 50

## 2022-04-19 MED ORDER — FENTANYL CITRATE PF 50 MCG/ML IJ SOSY
50.0000 ug | PREFILLED_SYRINGE | Freq: Once | INTRAMUSCULAR | Status: AC
  Administered 2022-04-19: 50 ug via INTRAVENOUS
  Filled 2022-04-19: qty 1

## 2022-04-19 MED ORDER — VANCOMYCIN HCL IN DEXTROSE 1-5 GM/200ML-% IV SOLN: 1000.0000 mg | INTRAVENOUS | Status: DC

## 2022-04-19 MED ORDER — VANCOMYCIN HCL IN DEXTROSE 1-5 GM/200ML-% IV SOLN: 1000.0000 mg | Freq: Once | INTRAVENOUS | Status: DC

## 2022-04-19 MED ORDER — IOHEXOL 300 MG/ML  SOLN
75.0000 mL | Freq: Once | INTRAMUSCULAR | Status: AC | PRN
  Administered 2022-04-19: 75 mL via INTRAVENOUS

## 2022-04-19 MED ORDER — ONDANSETRON 4 MG PO TBDP
4.0000 mg | ORAL_TABLET | Freq: Once | ORAL | Status: AC
  Administered 2022-04-19: 4 mg via ORAL
  Filled 2022-04-19: qty 1

## 2022-04-19 MED ORDER — VANCOMYCIN HCL 2000 MG/400ML IV SOLN
2000.0000 mg | Freq: Once | INTRAVENOUS | Status: AC
  Administered 2022-04-19: 2000 mg via INTRAVENOUS
  Filled 2022-04-19: qty 400

## 2022-04-19 MED ORDER — LACTATED RINGERS IV BOLUS
1000.0000 mL | Freq: Once | INTRAVENOUS | Status: AC
  Administered 2022-04-19: 1000 mL via INTRAVENOUS

## 2022-04-19 MED ORDER — PIPERACILLIN-TAZOBACTAM 3.375 G IVPB
3.3750 g | Freq: Three times a day (TID) | INTRAVENOUS | Status: DC
  Administered 2022-04-20 (×2): 3.375 g via INTRAVENOUS
  Filled 2022-04-19 (×2): qty 50

## 2022-04-19 MED ORDER — LACTATED RINGERS IV BOLUS (SEPSIS)
1000.0000 mL | Freq: Once | INTRAVENOUS | Status: AC
  Administered 2022-04-19: 1000 mL via INTRAVENOUS

## 2022-04-19 MED ORDER — LACTATED RINGERS IV BOLUS (SEPSIS)
800.0000 mL | Freq: Once | INTRAVENOUS | Status: AC
  Administered 2022-04-19: 800 mL via INTRAVENOUS

## 2022-04-19 NOTE — Sepsis Progress Note (Signed)
Notified provider of need to order fluid bolus and vasopressors.  

## 2022-04-19 NOTE — Sepsis Progress Note (Signed)
Monitoring for the code sepsis protocol. °

## 2022-04-19 NOTE — ED Notes (Signed)
  Pt transported to ct 

## 2022-04-19 NOTE — ED Provider Notes (Signed)
[facility] EMERGENCY DEPARTMENT Provider Note   CSN: [id number] Arrival date & time: 04/19/22  1554     History  Chief Complaint  Patient presents with   Nausea     76 year old female with a past medical history of HFpEF, hypertension, hyperlipidemia, type 2 diabetes presents with an acute onset of nausea and nonbloody vomiting beginning earlier this morning.  Patient states that she was diagnosed with cellulitis of her right lower leg approximately 2 weeks ago and was on a course of Keflex with improvement.  However, over the last several days, she has had significantly worsening pain, redness, and swelling of this area.  Today, she started feeling bad and presented to the emergency room for evaluation. While in triage, patient was found to have a fever and was hypotensive and was brought immediately to the resuscitation bay.  The history is provided by the patient.       Home Medications Prior to Admission medications   Medication Sig Start Date End Date Taking? Authorizing Provider  acetaminophen (TYLENOL) 325 MG tablet Take 2 tablets (650 mg total) by mouth every 6 (six) hours as needed. Patient taking differently: Take 650 mg by mouth every 6 (six) hours as needed for mild pain. 03/17/21  Yes Saverio Danker, PA-C  Cholecalciferol (VITAMIN D) 50 MCG (2000 UT) tablet Take 2,000 Units by mouth daily.    Yes [provider]  cyanocobalamin 1000 MCG tablet Take 1,000 mcg by mouth daily.    Yes [provider]  escitalopram (LEXAPRO) 20 MG tablet Take 20 mg by mouth daily.   Yes [provider]  fluticasone (FLONASE) 50 MCG/ACT nasal spray Place 2 sprays into both nostrils daily as needed for allergies.  07/02/19  Yes [provider]  furosemide (LASIX) 20 MG tablet Take 20 mg by mouth daily. 09/23/19  Yes [provider]  gabapentin (NEURONTIN) 300 MG capsule Take 300 mg by mouth 2 (two) times daily. 07/25/19  Yes [provider]  Insulin Pen Needle 32G X 6 MM MISC Inject into the skin daily. 07/18/20  Yes [provider]  metoprolol succinate (TOPROL XL) 25 MG 24 hr tablet Take 1 tablet (25 mg total) by mouth daily. 12/29/21  Yes Lelon Perla, MD  omeprazole (PRILOSEC) 20 MG capsule Take 20 mg by mouth 2 (two) times daily before a meal.   Yes [provider]  OZEMPIC, 1 MG/DOSE, 4 MG/3ML SOPN Inject 2 mg into the skin every Friday. 06/19/21  Yes [provider]  potassium chloride (KLOR-CON) 10 MEQ tablet Take 10 mEq by mouth daily.   Yes [provider]  rosuvastatin (CRESTOR) 5 MG tablet Take 5 mg by mouth at bedtime.   Yes [provider]      Allergies    Other, Atorvastatin, Metronidazole, and Septra [sulfamethoxazole-trimethoprim]    Review of Systems   Review of Systems  Constitutional:  Positive for chills and fever.  Respiratory:  Negative for shortness of breath.   Cardiovascular:  Negative for chest pain.  Gastrointestinal:  Positive for nausea and vomiting. Negative for abdominal pain and diarrhea.    Physical Exam Updated Vital Signs BP (!) 118/55   Pulse (!) 106   Temp 98.1 F (36.7 C) (Oral)   Resp (!) 27   Ht '5\' 4"'$  (1.626 m)   Wt 106.6 kg   SpO2 98%   BMI 40.34 kg/m  Physical Exam Vitals and nursing note reviewed.  Constitutional:  General: She is not in acute distress.    Appearance: She is well-developed. She is obese.     Comments: Non-bloody vomitus noted around patient's mouth and on clothing  HENT:     Head: Normocephalic and atraumatic.     Mouth/Throat:     Mouth: Mucous membranes are dry.  Eyes:     Conjunctiva/sclera: Conjunctivae normal.  Cardiovascular:     Rate and Rhythm: Regular rhythm. Tachycardia present.     Pulses:          Dorsalis pedis pulses are 2+ on the right side and 2+ on the left side.     Heart sounds: No murmur heard. Pulmonary:     Effort: Pulmonary effort is normal. No  respiratory distress.     Breath sounds: Normal breath sounds. No wheezing, rhonchi or rales.  Abdominal:     Palpations: Abdomen is soft.     Tenderness: There is no abdominal tenderness. There is no guarding.  Musculoskeletal:     Cervical back: Neck supple.     Right lower leg: Edema present.     Comments: Significant right lower extremity warmth, redness, tenderness and edema extending to the proximal tibia. No palpable crepitus appreciated  Skin:    General: Skin is warm and dry.     Coloration: Skin is pale.  Neurological:     Mental Status: She is alert.  Psychiatric:        Mood and Affect: Mood normal.     ED Results / Procedures / Treatments   Labs (all labs ordered are listed, but only abnormal results are displayed) Labs Reviewed  CBC WITH DIFFERENTIAL/PLATELET - Abnormal; Notable for the following components:      Result Value   MCV 100.7 (*)    Lymphs Abs 0.3 (*)    Abs Immature Granulocytes 0.22 (*)    All other components within normal limits  COMPREHENSIVE METABOLIC PANEL - Abnormal; Notable for the following components:   Glucose, Bld 177 (*)    Creatinine, Ser 1.05 (*)    Total Bilirubin 1.9 (*)    GFR, Estimated 56 (*)    All other components within normal limits  URINALYSIS, ROUTINE W REFLEX MICROSCOPIC - Abnormal; Notable for the following components:   Color, Urine AMBER (*)    APPearance HAZY (*)    All other components within normal limits  LACTIC ACID, PLASMA - Abnormal; Notable for the following components:   Lactic Acid, Venous 5.2 (*)    All other components within normal limits  LACTIC ACID, PLASMA - Abnormal; Notable for the following components:   Lactic Acid, Venous 6.7 (*)    All other components within normal limits  PROTIME-INR - Abnormal; Notable for the following components:   Prothrombin Time 15.4 (*)    All other components within normal limits  RESP PANEL BY RT-PCR (FLU A&B, COVID) ARPGX2  CULTURE, BLOOD (ROUTINE X 2)   CULTURE, BLOOD (ROUTINE X 2)  URINE CULTURE  LIPASE, BLOOD  APTT  LACTIC ACID, PLASMA  CBG MONITORING, ED    EKG EKG Interpretation  Date/Time:  Monday April 19 2022 20:03:54 EDT Ventricular Rate:  95 PR Interval:  190 QRS Duration: 110 QT Interval:  382 QTC Calculation: 481 R Axis:   -13 Text Interpretation: Sinus rhythm Low voltage, precordial leads Borderline T abnormalities, anterior leads similar to Feb 2023 Confirmed by Sherwood Gambler [phone]) on 04/19/2022 9:16:13 PM  Radiology CT TIBIA FIBULA RIGHT W CONTRAST  Result Date: 04/19/2022 CLINICAL  DATA:  Soft tissue infection. EXAM: CT OF THE LOWER RIGHT EXTREMITY WITH CONTRAST TECHNIQUE: Multidetector CT imaging of the lower right extremity was performed according to the standard protocol following intravenous contrast administration. RADIATION DOSE REDUCTION: This exam was performed according to the departmental dose-optimization program which includes automated exposure control, adjustment of the mA and/or kV according to patient size and/or use of iterative reconstruction technique. CONTRAST:  96m OMNIPAQUE IOHEXOL 300 MG/ML  SOLN COMPARISON:  X-ray same day FINDINGS: Bones/Joint/Cartilage There is no evidence for fracture or dislocation. Total knee arthroplasty appears in anatomic alignment without evidence for hardware loosening. No obvious joint effusions are seen. Ligaments Suboptimally assessed by CT. Muscles and Tendons Deep fascial planes appear preserved. Muscles and tendons are grossly within normal limits. No enhancing fluid collections. Soft tissues There is diffuse subcutaneous swelling and edema throughout the mid and distal lower extremity. There is no evidence for soft tissue gas or enhancing fluid collection. There are some nonspecific scattered subcutaneous calcifications. IMPRESSION: 1. Diffuse subcutaneous edema and swelling of the mid and distal lower extremity. Findings can be seen in the setting of cellulitis. No  evidence for soft tissue gas, abscess, or deep fascial plane involvement. 2. No acute bony abnormality. 3. Knee arthroplasty appears uncomplicated. Electronically Signed   By: ARonney AstersM.D.   On: 04/19/2022 23:53   DG Tibia/Fibula Right Port  Result Date: 04/19/2022 CLINICAL DATA:  Fever, body aches EXAM: PORTABLE RIGHT TIBIA AND FIBULA - 2 VIEW COMPARISON:  None Available. FINDINGS: Changes of right knee replacement partially visualized. No acute bony abnormality. Specifically, no fracture, subluxation, or dislocation. Soft tissues are intact. IMPRESSION: No acute bony abnormality. Electronically Signed   By: KRolm BaptiseM.D.   On: 04/19/2022 21:09   DG Chest Port 1 View  Result Date: 04/19/2022 CLINICAL DATA:  Questionable sepsis - evaluate for abnormality EXAM: PORTABLE CHEST 1 VIEW COMPARISON:  11/17/2021 FINDINGS: Cardiomegaly. No confluent airspace opacities, effusions or edema. Mediastinal contours within normal limits. No acute bony abnormality. IMPRESSION: Cardiomegaly.  No active disease. Electronically Signed   By: KRolm BaptiseM.D.   On: 04/19/2022 21:08    Procedures Procedures    Medications Ordered in ED Medications  piperacillin-tazobactam (ZOSYN) IVPB 3.375 g (0 g Intravenous Stopped 04/19/22 2145)    Followed by  piperacillin-tazobactam (ZOSYN) IVPB 3.375 g (has no administration in time range)  vancomycin (VANCOCIN) IVPB 1000 mg/200 mL premix (has no administration in time range)  ondansetron (ZOFRAN-ODT) disintegrating tablet 4 mg (4 mg Oral Given 04/19/22 1735)  lactated ringers bolus 1,000 mL (0 mLs Intravenous Stopped 04/19/22 2323)    And  lactated ringers bolus 800 mL (0 mLs Intravenous Stopped 04/19/22 2221)  vancomycin (VANCOREADY) IVPB 2000 mg/400 mL (0 mg Intravenous Stopped 04/19/22 2346)  fentaNYL (SUBLIMAZE) injection 50 mcg (50 mcg Intravenous Given 04/19/22 2222)  iohexol (OMNIPAQUE) 300 MG/ML solution 75 mL (75 mLs Intravenous Contrast Given 04/19/22 2305)   lactated ringers bolus 1,000 mL (0 mLs Intravenous Stopped 04/20/22 0111)    ED Course/ Medical Decision Making/ A&P                           Medical Decision Making Amount and/or Complexity of Data Reviewed External Data Reviewed: notes. Labs: ordered. Radiology: ordered and independent interpretation performed. ECG/medicine tests: ordered and independent interpretation performed.  Risk Prescription drug management. Decision regarding hospitalization.   76year old female with a history as above presents with  concern for worsening right lower extremity cellulitis. On arrival, hemodynamics consistent with SIRS response as patient was febrile and tachycardic. She had an episode of hypotension in the waiting room and was immediately moved to the resuscitation bay. Initial concern for sepsis and possible developing septic shock. On my exam, patient is mentating well and pressures had improved spontaneously.  Code sepsis was initiated with 30 cc/kg of IBW and empiric antibiotics. I reviewed and interpreted the patient's labs. CBC without obvious leukocytosis or leukopenia, though her counts do have a left shift. CMP without significant electrolyte abnormalities or evidence of end organ dysfunction, normal AG. Initial lactic acid elevated at 5.2 and uptrending to 6.7 despite fluid resuscitation. Will given an additional bolus (total of 2800cc) and repeat lactic. We considered other etiologies for this, though she has no risk factors for poor liver clearance and is not on metformin. No other sources of infection identified, denies abdominal pain. We additionally obtained a CT of the right lower extremity to evaluate for evidence of necrotizing fasciitis, CT negative for this. CXR without focal consolidations to support pneumonia.  Given her uptrending lactic acid, I did speak with the ICU for possible admission. ICU did not feel that patient needed critical care at this time thus will discuss with  hospitalist. At the time of sign out, pending discussion with hospitalist team and admission. Please see oncoming provider's note for the remainder of patient's ED course and final disposition.  Final Clinical Impression(s) / ED Diagnoses Final diagnoses:  Cellulitis of right lower extremity  Lactic acidosis    Rx / DC Orders ED Discharge Orders     None         Conswella Bruney, [place], MD 04/20/22 1117    Sherwood Gambler, MD 04/20/22 1754

## 2022-04-19 NOTE — ED Notes (Signed)
Lab at bedside

## 2022-04-19 NOTE — ED Triage Notes (Signed)
Pt reports body aches and n/v all of a sudden this morning. Pt unable to keep anything down today. Pt temp 101 F.

## 2022-04-19 NOTE — ED Notes (Signed)
Pt returned from ct at this time

## 2022-04-19 NOTE — ED Provider Triage Note (Signed)
Emergency Medicine Provider Triage Evaluation Note  Kathleen Wiley , a 76 y.o. female  was evaluated in triage.  Pt complains of sudden onset nausea and vomiting with abdominal pain started this morning.  No diarrhea or known fevers or chills.  Hx of DMT2.  Denies flank pain, urinary symptoms.  Actively vomiting for most of interaction.  Not on anticoagulation.  Review of Systems  Positive:  Negative: As above  Physical Exam  BP 120/66 (BP Location: Right Arm)   Pulse (!) 112   Temp (!) 101.1 F (38.4 C) (Oral)   Resp 15   Ht '5\' 4"'$  (1.626 m)   Wt 106.6 kg   SpO2 99%   BMI 40.34 kg/m  Gen:   Awake, ill-appearing, actively vomiting Resp:  Normal effort  MSK:   Moves extremities without difficulty  Other:  Abdomen without focal tenderness, soft.  Medical Decision Making  Medically screening exam initiated at 5:30 PM.  Appropriate orders placed.  STACY SAILER was informed that the remainder of the evaluation will be completed by another provider, this initial triage assessment does not replace that evaluation, and the importance of remaining in the ED until their evaluation is complete.     Prince Rome, PA-C 90/12/22 1733

## 2022-04-19 NOTE — ED Notes (Signed)
Pt is requesting to go to the bathroom to obtain UA sample before meds are started. Pt will be wheel chaired to the restroom

## 2022-04-19 NOTE — Progress Notes (Signed)
Pharmacy Antibiotic Note  Kathleen Wiley is a 76 y.o. female for which pharmacy has been consulted for vancomycin and zosyn dosing for sepsis and cellulitis.  Patient with a history of EF, HTN, HLD, T2DM. Patient presenting with nausea and worsening redness, swelling, and pain to area of diagnosed cellulites, for which she has been on keflex.  SCr 1.05 - above baseline (BL around 0.75) WBC 4.1; LA 5.2; T 98.1 F; HR 93; RR 28  Plan: Zosyn 3.375g IV q8h (4 hour infusion) Vancomycin 2000 mg once then 1000 mg q24hr (eAUC 492) unless change in renal function Trend WBC, Fever, Renal function, & Clinical course F/u cultures, clinical course, WBC, fever De-escalate when able  Height: '5\' 4"'$  (162.6 cm) Weight: 106.6 kg (235 lb) IBW/kg (Calculated) : 54.7  Temp (24hrs), Avg:99.6 F (37.6 C), Min:98.1 F (36.7 C), Max:101.1 F (38.4 C)  Recent Labs  Lab 04/19/22 1730  WBC 4.1  CREATININE 1.05*    Estimated Creatinine Clearance: 56 mL/min (A) (by C-G formula based on SCr of 1.05 mg/dL (H)).    Allergies  Allergen Reactions   Other     Refuses whole blood but does accept albumin   Atorvastatin Other (See Comments)   Metronidazole Other (See Comments)    Chest pain, A-fib    Septra [Sulfamethoxazole-Trimethoprim] Other (See Comments)    Blisters     Antimicrobials this admission: zosyn 6/12 >>  vancomycin 6/12 >>  Microbiology results: Pending  Thank you for allowing pharmacy to be a part of this patient's care.  Lorelei Pont, PharmD, BCPS 04/19/2022 8:27 PM ED Clinical Pharmacist -  [phone]

## 2022-04-19 NOTE — ED Notes (Signed)
Pt requesting something to drink or ice chips spoke with provider and advised CT needed to be completed and resulted. At this time provided pt with mouth swabs to moisten her mouth. Also pt was desat into the 70's after pain meds and lab work. Provider aware. Pt placed on [place] 2lpm and is currently sat at 97%

## 2022-04-19 NOTE — ED Notes (Signed)
C/o not feeling well, cold chills, 6 vomiting episode.

## 2022-04-20 ENCOUNTER — Encounter (HOSPITAL_COMMUNITY): Payer: Self-pay | Admitting: Internal Medicine

## 2022-04-20 ENCOUNTER — Inpatient Hospital Stay (HOSPITAL_COMMUNITY): Payer: Medicare Other

## 2022-04-20 DIAGNOSIS — Z6841 Body Mass Index (BMI) 40.0 and over, adult: Secondary | ICD-10-CM | POA: Diagnosis not present

## 2022-04-20 DIAGNOSIS — L02415 Cutaneous abscess of right lower limb: Secondary | ICD-10-CM | POA: Diagnosis not present

## 2022-04-20 DIAGNOSIS — N179 Acute kidney failure, unspecified: Secondary | ICD-10-CM | POA: Diagnosis present

## 2022-04-20 DIAGNOSIS — I110 Hypertensive heart disease with heart failure: Secondary | ICD-10-CM | POA: Diagnosis present

## 2022-04-20 DIAGNOSIS — Z20822 Contact with and (suspected) exposure to covid-19: Secondary | ICD-10-CM | POA: Diagnosis present

## 2022-04-20 DIAGNOSIS — F32A Depression, unspecified: Secondary | ICD-10-CM | POA: Diagnosis present

## 2022-04-20 DIAGNOSIS — B954 Other streptococcus as the cause of diseases classified elsewhere: Secondary | ICD-10-CM | POA: Diagnosis not present

## 2022-04-20 DIAGNOSIS — T465X6A Underdosing of other antihypertensive drugs, initial encounter: Secondary | ICD-10-CM | POA: Diagnosis present

## 2022-04-20 DIAGNOSIS — E876 Hypokalemia: Secondary | ICD-10-CM | POA: Diagnosis not present

## 2022-04-20 DIAGNOSIS — L538 Other specified erythematous conditions: Secondary | ICD-10-CM | POA: Diagnosis not present

## 2022-04-20 DIAGNOSIS — R531 Weakness: Secondary | ICD-10-CM | POA: Diagnosis not present

## 2022-04-20 DIAGNOSIS — L03115 Cellulitis of right lower limb: Secondary | ICD-10-CM | POA: Diagnosis present

## 2022-04-20 DIAGNOSIS — Z872 Personal history of diseases of the skin and subcutaneous tissue: Secondary | ICD-10-CM | POA: Diagnosis not present

## 2022-04-20 DIAGNOSIS — L0390 Cellulitis, unspecified: Secondary | ICD-10-CM

## 2022-04-20 DIAGNOSIS — E1165 Type 2 diabetes mellitus with hyperglycemia: Secondary | ICD-10-CM | POA: Diagnosis not present

## 2022-04-20 DIAGNOSIS — E872 Acidosis, unspecified: Secondary | ICD-10-CM | POA: Diagnosis present

## 2022-04-20 DIAGNOSIS — Y92009 Unspecified place in unspecified non-institutional (private) residence as the place of occurrence of the external cause: Secondary | ICD-10-CM | POA: Diagnosis not present

## 2022-04-20 DIAGNOSIS — R7881 Bacteremia: Secondary | ICD-10-CM | POA: Diagnosis not present

## 2022-04-20 DIAGNOSIS — E860 Dehydration: Secondary | ICD-10-CM | POA: Diagnosis present

## 2022-04-20 DIAGNOSIS — F419 Anxiety disorder, unspecified: Secondary | ICD-10-CM | POA: Diagnosis present

## 2022-04-20 DIAGNOSIS — E119 Type 2 diabetes mellitus without complications: Secondary | ICD-10-CM | POA: Diagnosis not present

## 2022-04-20 DIAGNOSIS — A419 Sepsis, unspecified organism: Secondary | ICD-10-CM | POA: Diagnosis not present

## 2022-04-20 DIAGNOSIS — A408 Other streptococcal sepsis: Secondary | ICD-10-CM | POA: Diagnosis present

## 2022-04-20 DIAGNOSIS — Z87891 Personal history of nicotine dependence: Secondary | ICD-10-CM | POA: Diagnosis not present

## 2022-04-20 DIAGNOSIS — I10 Essential (primary) hypertension: Secondary | ICD-10-CM | POA: Diagnosis not present

## 2022-04-20 DIAGNOSIS — D72819 Decreased white blood cell count, unspecified: Secondary | ICD-10-CM | POA: Diagnosis not present

## 2022-04-20 DIAGNOSIS — R609 Edema, unspecified: Secondary | ICD-10-CM | POA: Diagnosis not present

## 2022-04-20 DIAGNOSIS — G4730 Sleep apnea, unspecified: Secondary | ICD-10-CM | POA: Diagnosis not present

## 2022-04-20 DIAGNOSIS — I878 Other specified disorders of veins: Secondary | ICD-10-CM | POA: Diagnosis present

## 2022-04-20 DIAGNOSIS — G4733 Obstructive sleep apnea (adult) (pediatric): Secondary | ICD-10-CM | POA: Diagnosis not present

## 2022-04-20 DIAGNOSIS — G9341 Metabolic encephalopathy: Secondary | ICD-10-CM | POA: Diagnosis present

## 2022-04-20 DIAGNOSIS — I5032 Chronic diastolic (congestive) heart failure: Secondary | ICD-10-CM | POA: Diagnosis present

## 2022-04-20 DIAGNOSIS — E7800 Pure hypercholesterolemia, unspecified: Secondary | ICD-10-CM | POA: Diagnosis present

## 2022-04-20 DIAGNOSIS — E1151 Type 2 diabetes mellitus with diabetic peripheral angiopathy without gangrene: Secondary | ICD-10-CM | POA: Diagnosis not present

## 2022-04-20 DIAGNOSIS — I4891 Unspecified atrial fibrillation: Secondary | ICD-10-CM | POA: Diagnosis not present

## 2022-04-20 DIAGNOSIS — M79604 Pain in right leg: Secondary | ICD-10-CM | POA: Diagnosis not present

## 2022-04-20 DIAGNOSIS — Z79899 Other long term (current) drug therapy: Secondary | ICD-10-CM | POA: Diagnosis not present

## 2022-04-20 DIAGNOSIS — D61818 Other pancytopenia: Secondary | ICD-10-CM | POA: Diagnosis present

## 2022-04-20 DIAGNOSIS — L309 Dermatitis, unspecified: Secondary | ICD-10-CM | POA: Diagnosis present

## 2022-04-20 LAB — CBC WITH DIFFERENTIAL/PLATELET
Abs Immature Granulocytes: 0.1 10*3/uL — ABNORMAL HIGH (ref 0.00–0.07)
Basophils Absolute: 0.1 10*3/uL (ref 0.0–0.1)
Basophils Relative: 2 %
Eosinophils Absolute: 0 10*3/uL (ref 0.0–0.5)
Eosinophils Relative: 0 %
HCT: 37.8 % (ref 36.0–46.0)
Hemoglobin: 12.4 g/dL (ref 12.0–15.0)
Immature Granulocytes: 2 %
Lymphocytes Relative: 7 %
Lymphs Abs: 0.3 10*3/uL — ABNORMAL LOW (ref 0.7–4.0)
MCH: 33.8 pg (ref 26.0–34.0)
MCHC: 32.8 g/dL (ref 30.0–36.0)
MCV: 103 fL — ABNORMAL HIGH (ref 80.0–100.0)
Monocytes Absolute: 0.3 10*3/uL (ref 0.1–1.0)
Monocytes Relative: 6 %
Neutro Abs: 3.6 10*3/uL (ref 1.7–7.7)
Neutrophils Relative %: 83 %
Platelets: 78 10*3/uL — ABNORMAL LOW (ref 150–400)
RBC: 3.67 MIL/uL — ABNORMAL LOW (ref 3.87–5.11)
RDW: 14.5 % (ref 11.5–15.5)
Smear Review: DECREASED
WBC Morphology: INCREASED
WBC: 4.3 10*3/uL (ref 4.0–10.5)
nRBC: 0.5 % — ABNORMAL HIGH (ref 0.0–0.2)

## 2022-04-20 LAB — BLOOD CULTURE ID PANEL (REFLEXED) - BCID2

## 2022-04-20 LAB — COMPREHENSIVE METABOLIC PANEL
ALT: 26 U/L (ref 0–44)
AST: 33 U/L (ref 15–41)
Albumin: 3.4 g/dL — ABNORMAL LOW (ref 3.5–5.0)
Alkaline Phosphatase: 62 U/L (ref 38–126)
Anion gap: 13 (ref 5–15)
BUN: 22 mg/dL (ref 8–23)
CO2: 23 mmol/L (ref 22–32)
Calcium: 8.8 mg/dL — ABNORMAL LOW (ref 8.9–10.3)
Chloride: 99 mmol/L (ref 98–111)
Creatinine, Ser: 1.4 mg/dL — ABNORMAL HIGH (ref 0.44–1.00)
GFR, Estimated: 39 mL/min — ABNORMAL LOW (ref >60)
Glucose, Bld: 135 mg/dL — ABNORMAL HIGH (ref 70–99)
Potassium: 3.8 mmol/L (ref 3.5–5.1)
Sodium: 135 mmol/L (ref 135–145)
Total Bilirubin: 2 mg/dL — ABNORMAL HIGH (ref 0.3–1.2)
Total Protein: 6.3 g/dL — ABNORMAL LOW (ref 6.5–8.1)

## 2022-04-20 LAB — GLUCOSE, CAPILLARY
Glucose-Capillary: 115 mg/dL — ABNORMAL HIGH (ref 70–99)
Glucose-Capillary: 121 mg/dL — ABNORMAL HIGH (ref 70–99)

## 2022-04-20 LAB — LACTIC ACID, PLASMA
Lactic Acid, Venous: 4.2 mmol/L (ref 0.5–1.9)
Lactic Acid, Venous: 3.8 mmol/L (ref 0.5–1.9)

## 2022-04-20 LAB — CBG MONITORING, ED
Glucose-Capillary: 170 mg/dL — ABNORMAL HIGH (ref 70–99)
Glucose-Capillary: 123 mg/dL — ABNORMAL HIGH (ref 70–99)
Glucose-Capillary: 73 mg/dL (ref 70–99)

## 2022-04-20 LAB — HEMOGLOBIN A1C
Hgb A1c MFr Bld: 5.2 % (ref 4.8–5.6)
Mean Plasma Glucose: 102.54 mg/dL

## 2022-04-20 IMAGING — CT CT HEAD W/O CM
3 series · 15 of 47 positions shown, 18 images · non-contrast
Comparison: None Available.

CLINICAL DATA: 75-year-old female altered mental status.



[Series 3: head 5.0 (person_name)30(person_name) (person_name · axial · 0.41mm/px · z∈[-104,+31]mm · 9 of 33 slices shown, 12 images]
[im 3/33  brain]
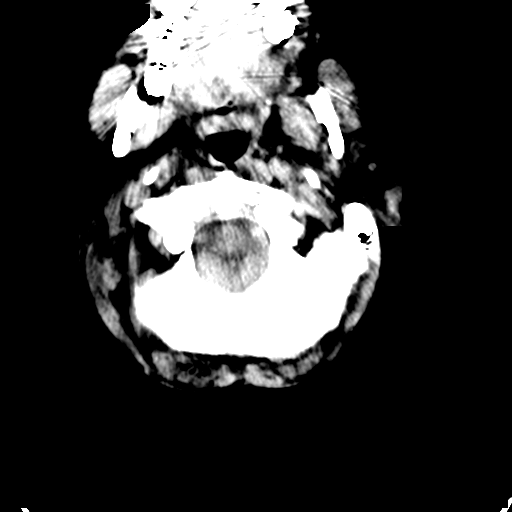
[im 3/33  bone]
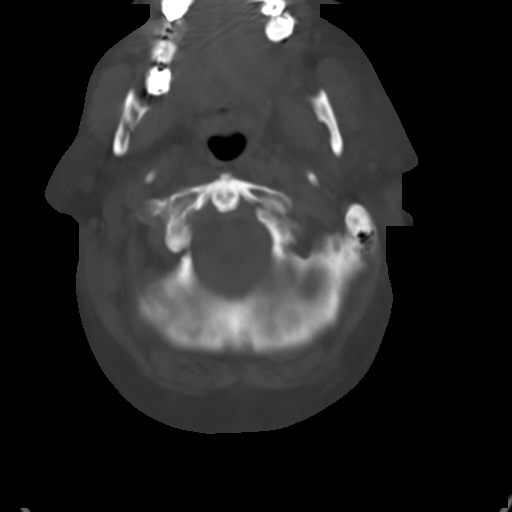
[im 6/33  brain]
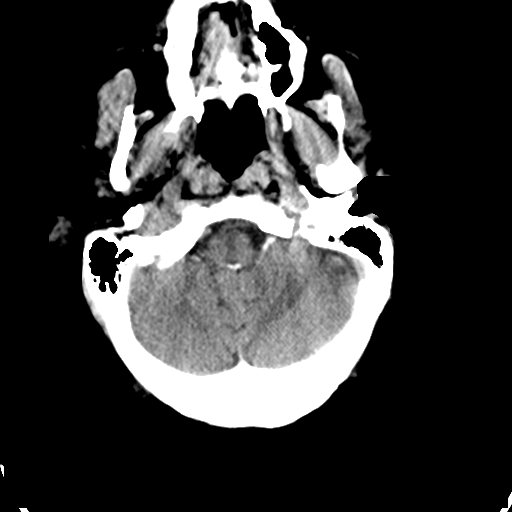
[im 9/33  brain]
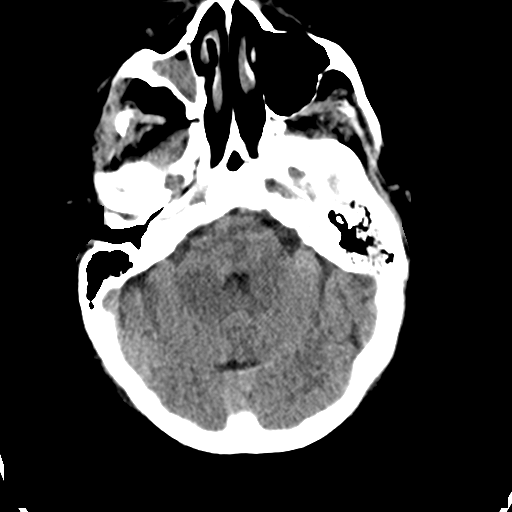
[im 13/33  brain]
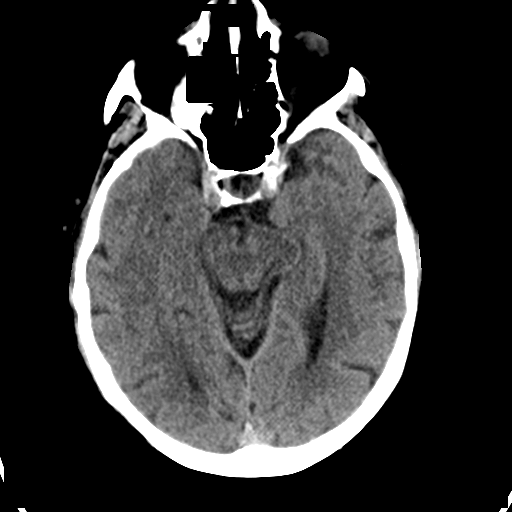
[im 17/33  brain]
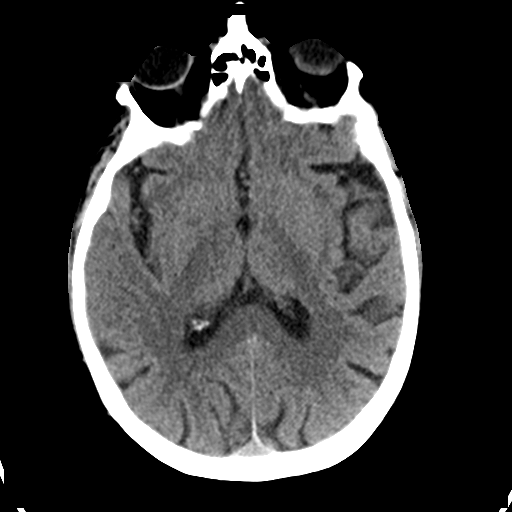
[im 17/33  bone]
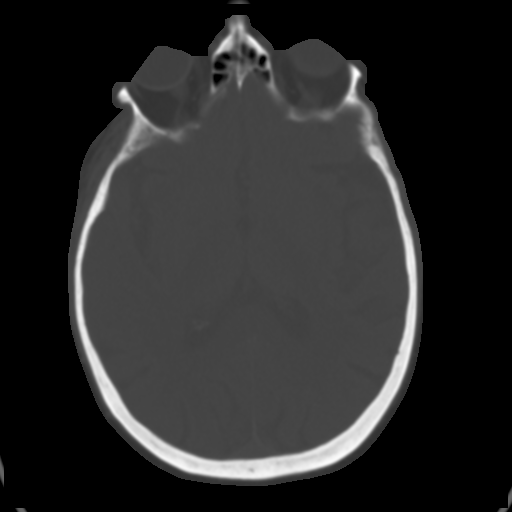
[im 20/33  brain]
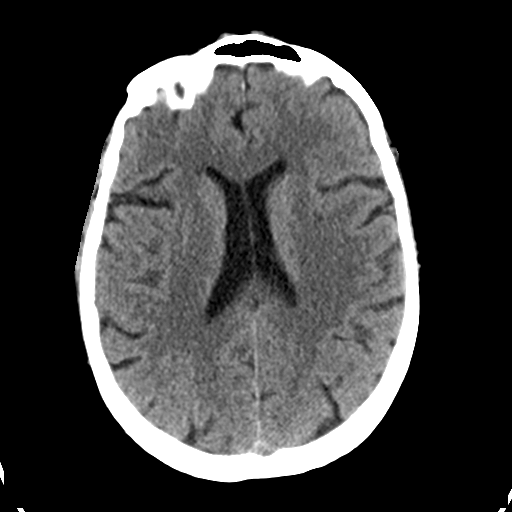
[im 24/33  brain]
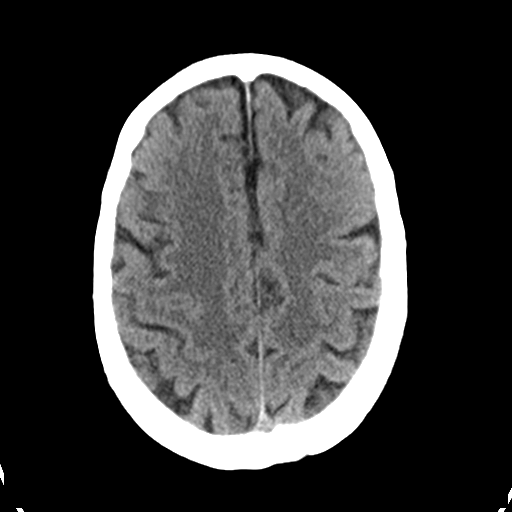
[im 27/33  brain]
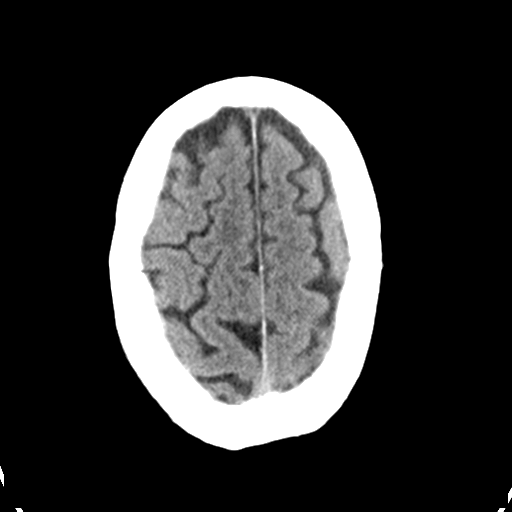
[im 30/33  brain]
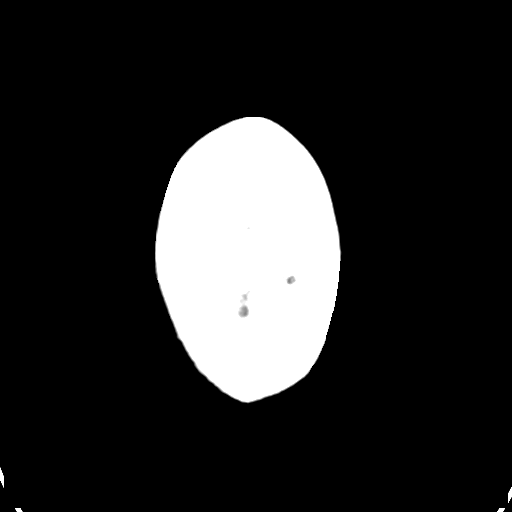
[im 30/33  bone]
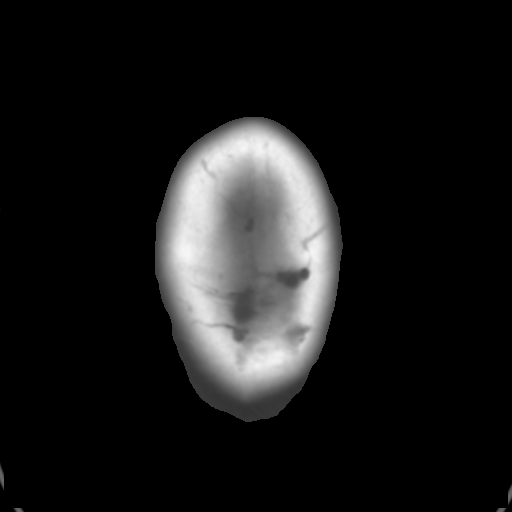

[Series 5: head 3.0 mpr cor · coronal · 0.32mm/px · 3 of 68 slices shown]
[im 23/68  brain]
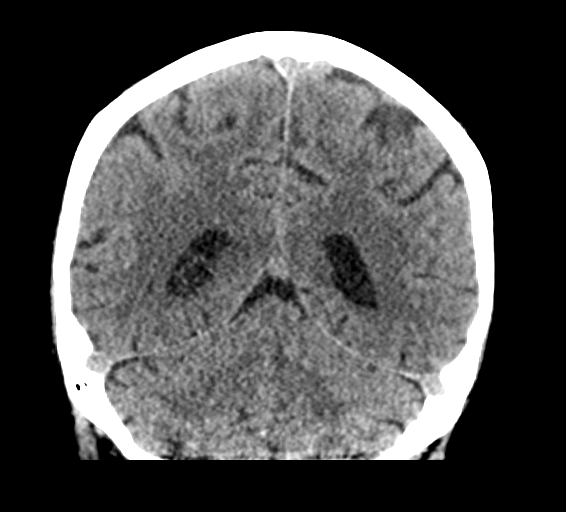
[im 30/68  brain]
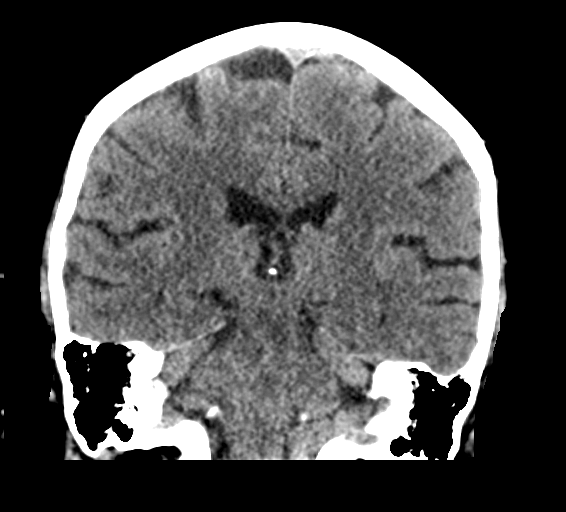
[im 38/68  brain]
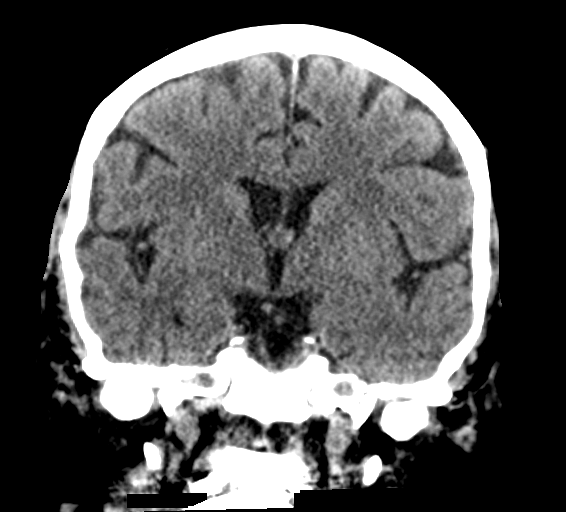

[Series 6: head 3.0 mpr sag · sagittal · 0.32mm/px · 3 of 57 slices shown]
[im 19/57  brain]
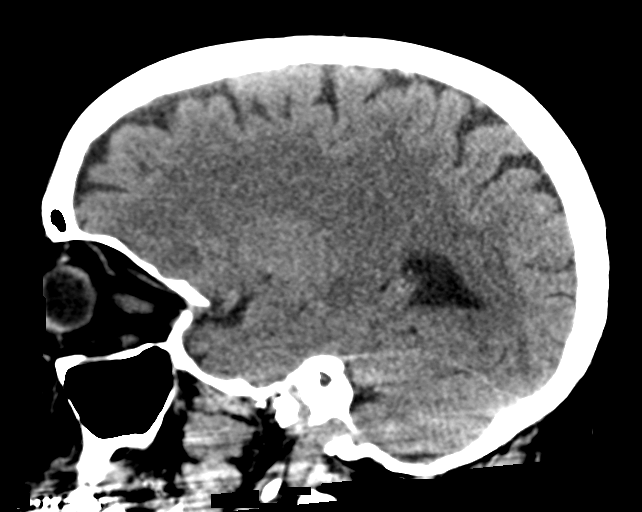
[im 29/57  brain]
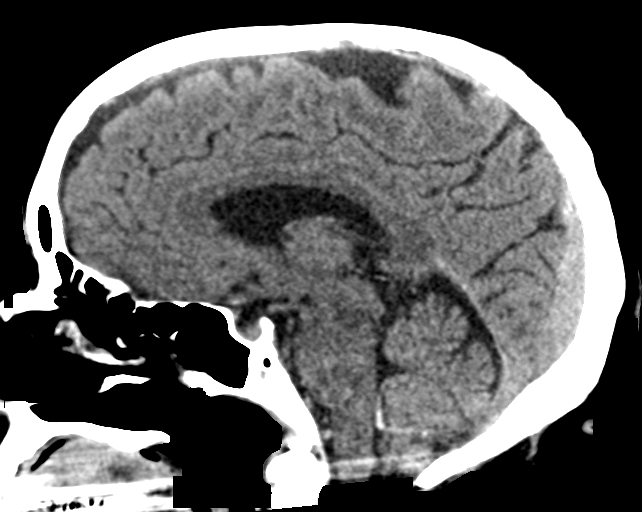
[im 38/57  brain]
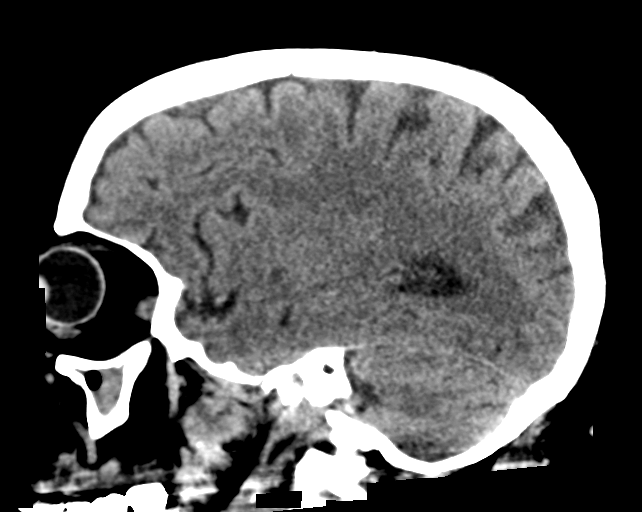

[15 of 47 positions shown; findings below may reference images not displayed]

FINDINGS: Brain: Cerebral volume is within normal limits for age. No midline
shift, ventriculomegaly, mass effect, evidence of mass lesion,
intracranial hemorrhage or evidence of cortically based acute
infarction. Minimal to mild for age subcortical white matter
hypodensity. No cortical encephalomalacia and otherwise normal
gray-white differentiation.

Vascular: Calcified atherosclerosis at the skull base. No suspicious
intracranial vascular hyperdensity.

Skull: No acute osseous abnormality identified. Anterior C1-C2
degeneration.

Sinuses/Orbits: Right maxillary sinus mucoperiosteal thickening with
superimposed fluid level. But the other paranasal sinuses are well
aerated. Bilateral tympanic cavities and mastoids appear clear.

Other: Postoperative changes to both globes. No acute orbit or scalp
soft tissue finding. Negative visible noncontrast deep soft tissue
spaces of the face.
IMPRESSION: 1. No acute intracranial abnormality. Negative for age noncontrast
CT appearance of the brain.
2. Acute on chronic right maxillary sinusitis.

## 2022-04-20 MED ORDER — ROSUVASTATIN CALCIUM 5 MG PO TABS
5.0000 mg | ORAL_TABLET | Freq: Every day | ORAL | Status: DC
  Administered 2022-04-20 – 2022-04-27 (×8): 5 mg via ORAL
  Filled 2022-04-20 (×8): qty 1

## 2022-04-20 MED ORDER — OXYCODONE HCL 5 MG PO TABS
5.0000 mg | ORAL_TABLET | Freq: Four times a day (QID) | ORAL | Status: DC | PRN
  Administered 2022-04-20 – 2022-04-21 (×4): 5 mg via ORAL
  Filled 2022-04-20 (×4): qty 1

## 2022-04-20 MED ORDER — ONDANSETRON HCL 4 MG/2ML IJ SOLN
4.0000 mg | Freq: Four times a day (QID) | INTRAMUSCULAR | Status: DC | PRN
  Administered 2022-04-20: 4 mg via INTRAVENOUS
  Filled 2022-04-20: qty 2

## 2022-04-20 MED ORDER — SODIUM CHLORIDE 0.9 % IV SOLN: INTRAVENOUS | Status: DC

## 2022-04-20 MED ORDER — HEPARIN SODIUM (PORCINE) 5000 UNIT/ML IJ SOLN
5000.0000 [IU] | Freq: Three times a day (TID) | INTRAMUSCULAR | Status: DC
  Administered 2022-04-20 – 2022-04-22 (×7): 5000 [IU] via SUBCUTANEOUS
  Filled 2022-04-20 (×7): qty 1

## 2022-04-20 MED ORDER — SODIUM CHLORIDE 0.9 % IV BOLUS
500.0000 mL | Freq: Once | INTRAVENOUS | Status: AC
  Administered 2022-04-20: 500 mL via INTRAVENOUS

## 2022-04-20 MED ORDER — SODIUM CHLORIDE 0.9 % IV SOLN: INTRAVENOUS | Status: AC

## 2022-04-20 MED ORDER — VANCOMYCIN HCL 1500 MG/300ML IV SOLN
1500.0000 mg | INTRAVENOUS | Status: DC
  Filled 2022-04-20: qty 300

## 2022-04-20 MED ORDER — INSULIN ASPART 100 UNIT/ML IJ SOLN
0.0000 [IU] | Freq: Three times a day (TID) | INTRAMUSCULAR | Status: DC
  Administered 2022-04-20 – 2022-04-25 (×8): 1 [IU] via SUBCUTANEOUS
  Administered 2022-04-26: 2 [IU] via SUBCUTANEOUS
  Administered 2022-04-26: 1 [IU] via SUBCUTANEOUS
  Administered 2022-04-27: 2 [IU] via SUBCUTANEOUS

## 2022-04-20 MED ORDER — ESCITALOPRAM OXALATE 20 MG PO TABS
20.0000 mg | ORAL_TABLET | Freq: Every day | ORAL | Status: DC
  Administered 2022-04-21 – 2022-04-27 (×7): 20 mg via ORAL
  Filled 2022-04-20 (×7): qty 1

## 2022-04-20 MED ORDER — ACETAMINOPHEN 325 MG PO TABS
650.0000 mg | ORAL_TABLET | Freq: Once | ORAL | Status: AC
  Administered 2022-04-20: 650 mg via ORAL
  Filled 2022-04-20: qty 2

## 2022-04-20 MED ORDER — VITAMIN B-12 1000 MCG PO TABS
1000.0000 ug | ORAL_TABLET | Freq: Every day | ORAL | Status: DC
Start: 2022-04-20 — End: 2022-04-27
  Administered 2022-04-20 – 2022-04-27 (×8): 1000 ug via ORAL
  Filled 2022-04-20 (×8): qty 1

## 2022-04-20 MED ORDER — SODIUM CHLORIDE 0.9 % IV SOLN
2.0000 g | INTRAVENOUS | Status: DC
  Administered 2022-04-20 – 2022-04-22 (×3): 2 g via INTRAVENOUS
  Filled 2022-04-20 (×3): qty 20

## 2022-04-20 MED ORDER — GABAPENTIN 300 MG PO CAPS
300.0000 mg | ORAL_CAPSULE | Freq: Two times a day (BID) | ORAL | Status: DC
  Administered 2022-04-20 – 2022-04-21 (×3): 300 mg via ORAL
  Filled 2022-04-20 (×3): qty 1

## 2022-04-20 MED ORDER — ACETAMINOPHEN 325 MG PO TABS
650.0000 mg | ORAL_TABLET | Freq: Four times a day (QID) | ORAL | Status: DC | PRN
Start: 2022-04-20 — End: 2022-04-21
  Administered 2022-04-20 (×2): 650 mg via ORAL
  Filled 2022-04-20 (×2): qty 2

## 2022-04-20 MED ORDER — ACETAMINOPHEN 650 MG RE SUPP: 650.0000 mg | Freq: Four times a day (QID) | RECTAL | Status: DC | PRN

## 2022-04-20 NOTE — H&P (Signed)
History and Physical    Kathleen Wiley QQP:[id number] DOB: 1946/09/04 DOA: 04/19/2022  PCP: Jonathon Jordan, MD  Patient coming from: Home.  Chief Complaint: Nausea vomiting fever chills.  HPI: Kathleen Wiley is a 76 y.o. female with history of diabetes mellitus type 2, hypertension, hyperlipidemia presents to the ER with nausea vomiting fever chills over the last 24 hours.  Patient also noticed increasing redness in the right lower extremity.  Has had prior episodes of infection of the right lower extremity.  ED Course: In the ER patient was tachycardic confused and also had a increased lactic acid levels.  On exam patient's right lower extremity is erythematous and edematous.  Warm to touch.  Consistent with cellulitis.  CT scan does show soft tissue edema but no definite evidence of any necrotizing fasciitis.  Patient started on empiric antibiotics admitted for further work-up.  Lactate was elevated which is improving after fluids.  Patient also appears confused.  Review of Systems: As per HPI, rest all negative.   Past Medical History:  Diagnosis Date   Anxiety    Arthritis    knee, hip   Cellulitis 08/2016   left leg   Complication of anesthesia    Diabetes mellitus without complication (HCC)    GERD (gastroesophageal reflux disease)    Heart murmur    "slight "  informed years ago.-    Hernia, umbilical    History of atrial fibrillation 2007   with flagyl   History of endometriosis    Hypercholesteremia    Hypertension    has not been to a cardiologist   PONV (postoperative nausea and vomiting)    Rectal vaginal fistula    Refusal of blood transfusions as patient is Jehovah's Witness    Sleep apnea     Past Surgical History:  Procedure Laterality Date   APPENDECTOMY     BUNIONECTOMY Right 01/09/2019   Procedure: RIGHT FOOT BUNION CORRECTION;  Surgeon: Erle Crocker, MD;  Location: [place];  Service: Orthopedics;  Laterality: Right;   CHOLECYSTECTOMY N/A  03/16/2021   Procedure: LAPAROSCOPIC CHOLECYSTECTOMY;  Surgeon: Stark Klein, MD;  Location: [facility];  Service: General;  Laterality: N/A;   ENDOSCOPIC RETROGRADE CHOLANGIOPANCREATOGRAPHY (ERCP) WITH PROPOFOL N/A 03/15/2021   Procedure: ENDOSCOPIC RETROGRADE CHOLANGIOPANCREATOGRAPHY (ERCP) WITH PROPOFOL;  Surgeon: Ronnette Juniper, MD;  Location: [facility];  Service: Gastroenterology;  Laterality: N/A;   EXPLORATORY LAPAROTOMY  1973   for endometrosis   HAMMERTOE RECONSTRUCTION WITH WEIL OSTEOTOMY Right 01/09/2019   Procedure: RIGHT FOOT 2-4 HAMMERTOE CORRECTION WITH WEIL OSTEOTOMY 2ND METATARSAL, DEEP TENDON TRANSFER WITH AKIN OSTEOTOMY;  Surgeon: Erle Crocker, MD;  Location: [place];  Service: Orthopedics;  Laterality: Right;   I & D EXTREMITY Right 06/10/2016   Procedure: IRRIGATION AND DEBRIDEMENT THUMB FLEXOR SHEATH;  Surgeon: Leanora Cover, MD;  Location: [place];  Service: Orthopedics;  Laterality: Right;   JOINT REPLACEMENT     Left Hip   MANDIBLE SURGERY  1978   rectovaginal fistula repair  1980   REMOVAL OF STONES  03/15/2021   Procedure: REMOVAL OF STONES;  Surgeon: Ronnette Juniper, MD;  Location: [facility];  Service: Gastroenterology;;   Joan Mayans  03/15/2021   Procedure: Joan Mayans;  Surgeon: Ronnette Juniper, MD;  Location: [facility];  Service: Gastroenterology;;   TOTAL HIP ARTHROPLASTY  05/08/2012   Procedure: TOTAL HIP ARTHROPLASTY;  Surgeon: Kerin Salen, MD;  Location: [place];  Service: Orthopedics;  Laterality: Left;   TOTAL HIP ARTHROPLASTY Right 09/18/2018  Procedure: TOTAL HIP ARTHROPLASTY ANTERIOR APPROACH;  Surgeon: Frederik Pear, MD;  Location: [facility];  Service: Orthopedics;  Laterality: Right;   TOTAL KNEE ARTHROPLASTY Right 01/24/2013   Dr Mayer Camel   TOTAL KNEE ARTHROPLASTY Right 01/24/2013   Procedure: RIGHT TOTAL KNEE ARTHROPLASTY;  Surgeon: Kerin Salen, MD;  Location: [place];  Service: Orthopedics;  Laterality: Right;     reports that she quit smoking about 40 years  ago. Her smoking use included cigarettes. She has a 10.00 pack-year smoking history. She has never used smokeless tobacco. She reports that she does not drink alcohol and does not use drugs.  Allergies  Allergen Reactions   Other     Refuses whole blood but does accept albumin   Atorvastatin Other (See Comments)   Metronidazole Other (See Comments)    Chest pain, A-fib    Septra [Sulfamethoxazole-Trimethoprim] Other (See Comments)    Blisters     Family History  Problem Relation Age of Onset   Dementia Mother    Cancer Father    Breast cancer Neg Hx     Prior to Admission medications   Medication Sig Start Date End Date Taking? Authorizing Provider  acetaminophen (TYLENOL) 325 MG tablet Take 2 tablets (650 mg total) by mouth every 6 (six) hours as needed. Patient taking differently: Take 650 mg by mouth every 6 (six) hours as needed for mild pain. 03/17/21  Yes Saverio Danker, PA-C  Cholecalciferol (VITAMIN D) 50 MCG (2000 UT) tablet Take 2,000 Units by mouth daily.    Yes [provider]  cyanocobalamin 1000 MCG tablet Take 1,000 mcg by mouth daily.    Yes [provider]  escitalopram (LEXAPRO) 20 MG tablet Take 20 mg by mouth daily.   Yes [provider]  fluticasone (FLONASE) 50 MCG/ACT nasal spray Place 2 sprays into both nostrils daily as needed for allergies.  07/02/19  Yes [provider]  furosemide (LASIX) 20 MG tablet Take 20 mg by mouth daily. 09/23/19  Yes [provider]  gabapentin (NEURONTIN) 300 MG capsule Take 300 mg by mouth 2 (two) times daily. 07/25/19  Yes [provider]  Insulin Pen Needle 32G X 6 MM MISC Inject into the skin daily. 07/18/20  Yes [provider]  metoprolol succinate (TOPROL XL) 25 MG 24 hr tablet Take 1 tablet (25 mg total) by mouth daily. 12/29/21  Yes Lelon Perla, MD  omeprazole (PRILOSEC) 20 MG capsule Take 20 mg by mouth 2 (two) times daily before a meal.   Yes [provider]  OZEMPIC, 1 MG/DOSE, 4 MG/3ML SOPN Inject 2 mg into the skin every Friday. 06/19/21  Yes [provider]  potassium chloride (KLOR-CON) 10 MEQ tablet Take 10 mEq by mouth daily.   Yes [provider]  rosuvastatin (CRESTOR) 5 MG tablet Take 5 mg by mouth at bedtime.   Yes [provider]    Physical Exam: Constitutional: Moderately built and nourished. Vitals:   04/20/22 0300 04/20/22 0330 04/20/22 0400 04/20/22 0409  BP: (!) 126/58 (!) 126/54 (!) 130/52   Pulse: (!) 101 99 99   Resp: (!) 28 (!) 29 (!) 29   Temp:    99.7 F (37.6 C)  TempSrc:    Oral  SpO2: 98% 97% 97%   Weight:      Height:       Eyes: Anicteric no pallor. ENMT: No discharge from the ears eyes nose and mouth. Neck: No mass felt.  No neck  rigidity. Respiratory: No rhonchi or crepitations. Cardiovascular: S1-S2 heard. Abdomen: Soft nontender bowel sound present. Musculoskeletal: Swelling of the right lower extremity below the knee and the whole foot. Skin: Erythema of the right lower extremity extending from the below knee and involving the whole foot. Neurologic: Alert awake oriented to name and place at times confused moving all extremities. Psychiatric: Mildly confused.   Labs on Admission: I have personally reviewed following labs and imaging studies  CBC: Recent Labs  Lab 04/19/22 1730  WBC 4.1  NEUTROABS 3.4  HGB 13.4  HCT 40.4  MCV 100.7*  PLT PLATELET CLUMPS NOTED ON SMEAR, UNABLE TO ESTIMATE   Basic Metabolic Panel: Recent Labs  Lab 04/19/22 1730  NA 136  K 3.7  CL 102  CO2 23  GLUCOSE 177*  BUN 18  CREATININE 1.05*  CALCIUM 9.3   GFR: Estimated Creatinine Clearance: 56 mL/min (A) (by C-G formula based on SCr of 1.05 mg/dL (H)). Liver Function Tests: Recent Labs  Lab 04/19/22 1730  AST 36  ALT 29  ALKPHOS 77  BILITOT 1.9*  PROT 7.3  ALBUMIN 4.0   Recent Labs  Lab 04/19/22 1730  LIPASE 32   No results for input(s): "AMMONIA" in  the last 168 hours. Coagulation Profile: Recent Labs  Lab 04/19/22 2033  INR 1.2   Cardiac Enzymes: No results for input(s): "CKTOTAL", "CKMB", "CKMBINDEX", "TROPONINI" in the last 168 hours. BNP (last 3 results) No results for input(s): "PROBNP" in the last 8760 hours. HbA1C: No results for input(s): "HGBA1C" in the last 72 hours. CBG: No results for input(s): "GLUCAP" in the last 168 hours. Lipid Profile: No results for input(s): "CHOL", "HDL", "LDLCALC", "TRIG", "CHOLHDL", "LDLDIRECT" in the last 72 hours. Thyroid Function Tests: No results for input(s): "TSH", "T4TOTAL", "FREET4", "T3FREE", "THYROIDAB" in the last 72 hours. Anemia Panel: No results for input(s): "VITAMINB12", "FOLATE", "FERRITIN", "TIBC", "IRON", "RETICCTPCT" in the last 72 hours. Urine analysis:    Component Value Date/Time   COLORURINE AMBER (A) 04/19/2022 2123   APPEARANCEUR HAZY (A) 04/19/2022 2123   LABSPEC 1.013 04/19/2022 2123   PHURINE 5.0 04/19/2022 2123   GLUCOSEU NEGATIVE 04/19/2022 2123   HGBUR NEGATIVE 04/19/2022 2123   BILIRUBINUR NEGATIVE 04/19/2022 2123   KETONESUR NEGATIVE 04/19/2022 2123   PROTEINUR NEGATIVE 04/19/2022 2123   UROBILINOGEN 1.0 08/06/2012 2046   NITRITE NEGATIVE 04/19/2022 2123   LEUKOCYTESUR NEGATIVE 04/19/2022 2123   Sepsis Labs: '@LABRCNTIP'$ (procalcitonin:4,lacticidven:4) ) Recent Results (from the past 240 hour(s))  Resp Panel by RT-PCR (Flu A&B, Covid) Anterior Nasal Swab     Status: None   Collection Time: 04/19/22  8:05 PM   Specimen: Anterior Nasal Swab  Result Value Ref Range Status   SARS Coronavirus 2 by RT PCR NEGATIVE NEGATIVE Final    Comment: (NOTE) SARS-CoV-2 target nucleic acids are NOT DETECTED.  The SARS-CoV-2 RNA is generally detectable in upper respiratory specimens during the acute phase of infection. The lowest concentration of SARS-CoV-2 viral copies this assay can detect is 138 copies/mL. A negative result does not preclude  SARS-Cov-2 infection and should not be used as the sole basis for treatment or other patient management decisions. A negative result may occur with  improper specimen collection/handling, submission of specimen other than nasopharyngeal swab, presence of viral mutation(s) within the areas targeted by this assay, and inadequate number of viral copies(<138 copies/mL). A negative result must be combined with clinical observations, patient history, and epidemiological information. The expected result is Negative.  Fact Sheet for Patients:  EntrepreneurPulse.com.au  Fact Sheet for Healthcare Providers:  IncredibleEmployment.be  This test is no t yet approved or cleared by the [place] FDA and  has been authorized for detection and/or diagnosis of SARS-CoV-2 by FDA under an Emergency Use Authorization (EUA). This EUA will remain  in effect (meaning this test can be used) for the duration of the COVID-19 declaration under Section 564(b)(1) of the Act, 21 U.S.C.section 360bbb-3(b)(1), unless the authorization is terminated  or revoked sooner.       Influenza A by PCR NEGATIVE NEGATIVE Final   Influenza B by PCR NEGATIVE NEGATIVE Final    Comment: (NOTE) The Xpert Xpress SARS-CoV-2/FLU/RSV plus assay is intended as an aid in the diagnosis of influenza from Nasopharyngeal swab specimens and should not be used as a sole basis for treatment. Nasal washings and aspirates are unacceptable for Xpert Xpress SARS-CoV-2/FLU/RSV testing.  Fact Sheet for Patients: EntrepreneurPulse.com.au  Fact Sheet for Healthcare Providers: IncredibleEmployment.be  This test is not yet approved or cleared by the [place] FDA and has been authorized for detection and/or diagnosis of SARS-CoV-2 by FDA under an Emergency Use Authorization (EUA). This EUA will remain in effect (meaning this test can be used) for the duration of  the COVID-19 declaration under Section 564(b)(1) of the Act, 21 U.S.C. section 360bbb-3(b)(1), unless the authorization is terminated or revoked.  Performed at [facility], [place] [address] [place], [place] [address]      Radiological Exams on Admission: CT TIBIA FIBULA RIGHT W CONTRAST  Result Date: 04/19/2022 CLINICAL DATA:  Soft tissue infection. EXAM: CT OF THE LOWER RIGHT EXTREMITY WITH CONTRAST TECHNIQUE: Multidetector CT imaging of the lower right extremity was performed according to the standard protocol following intravenous contrast administration. RADIATION DOSE REDUCTION: This exam was performed according to the departmental dose-optimization program which includes automated exposure control, adjustment of the mA and/or kV according to patient size and/or use of iterative reconstruction technique. CONTRAST:  62m OMNIPAQUE IOHEXOL 300 MG/ML  SOLN COMPARISON:  X-ray same day FINDINGS: Bones/Joint/Cartilage There is no evidence for fracture or dislocation. Total knee arthroplasty appears in anatomic alignment without evidence for hardware loosening. No obvious joint effusions are seen. Ligaments Suboptimally assessed by CT. Muscles and Tendons Deep fascial planes appear preserved. Muscles and tendons are grossly within normal limits. No enhancing fluid collections. Soft tissues There is diffuse subcutaneous swelling and edema throughout the mid and distal lower extremity. There is no evidence for soft tissue gas or enhancing fluid collection. There are some nonspecific scattered subcutaneous calcifications. IMPRESSION: 1. Diffuse subcutaneous edema and swelling of the mid and distal lower extremity. Findings can be seen in the setting of cellulitis. No evidence for soft tissue gas, abscess, or deep fascial plane involvement. 2. No acute bony abnormality. 3. Knee arthroplasty appears uncomplicated. Electronically Signed   By: ARonney AstersM.D.   On: 04/19/2022 23:53   DG Tibia/Fibula  Right Port  Result Date: 04/19/2022 CLINICAL DATA:  Fever, body aches EXAM: PORTABLE RIGHT TIBIA AND FIBULA - 2 VIEW COMPARISON:  None Available. FINDINGS: Changes of right knee replacement partially visualized. No acute bony abnormality. Specifically, no fracture, subluxation, or dislocation. Soft tissues are intact. IMPRESSION: No acute bony abnormality. Electronically Signed   By: KRolm BaptiseM.D.   On: 04/19/2022 21:09   DG Chest Port 1 View  Result Date: 04/19/2022 CLINICAL DATA:  Questionable sepsis - evaluate for abnormality EXAM: PORTABLE CHEST 1 VIEW COMPARISON:  11/17/2021 FINDINGS: Cardiomegaly. No confluent airspace opacities,  effusions or edema. Mediastinal contours within normal limits. No acute bony abnormality. IMPRESSION: Cardiomegaly.  No active disease. Electronically Signed   By: Rolm Baptise M.D.   On: 04/19/2022 21:08    EKG: Independently reviewed.  Normal sinus rhythm.  Assessment/Plan Principal Problem:   Sepsis ([place]) Active Problems:   Essential hypertension, benign   Type 2 diabetes mellitus with hyperglycemia (HCC)   Cellulitis of right lower extremity    Sepsis likely from right lower extremity cellulitis for which patient is on empiric antibiotics follow cultures continue hydration follow lactic acid levels.  CT scan does not show any signs of necrotizing fasciitis.  If lactic acid symptoms do not improve may consider MRI. Acute encephalopathy likely from sepsis.  CT head is pending. Nausea vomiting could be from sepsis.  Abdomen appears benign LFTs and lipase are normal.  We will continue to monitor. Diabetes mellitus type 2 we will keep patient on 20 scale coverage.  Patient takes Ozempic at home.  Hemoglobin A1c is pending. History of hypertension has not been taking antihypertensive for last 2 weeks.  Follow blood pressure trends.  Since patient has sepsis will need close monitoring inpatient status.   DVT prophylaxis: Heparin. Code Status: Full  code. Family Communication: Patient husband at the bedside. Disposition Plan: Home. Consults called: None. Admission status: Inpatient.   Rise Patience MD [facility] Pager [phone].  If 7PM-7AM, please contact night-coverage [email or web address] Password [email or web address]  04/20/2022, 4:31 AM

## 2022-04-20 NOTE — Progress Notes (Signed)
Pharmacy Antibiotic Note  Kathleen Wiley is a 76 y.o. female for which pharmacy has been consulted for vancomycin and zosyn dosing for sepsis and cellulitis. Patient with a history of EF, HTN, HLD, T2DM. Patient presenting with nausea and worsening redness, swelling, and pain to area of diagnosed cellulites, for which she has been on keflex. Scr up to 1.4 - baseline ~0.75 to 0.9.   Plan: Continue Zosyn 3.375g IV q8h (extended infusion) Adjust vancomycin to '1500mg'$  q48h Monitor renal function closely to hold vancomycin and dose by random level  Monitor renal function, cultures, and clinical progression   Height: '5\' 4"'$  (162.6 cm) Weight: 106.6 kg (235 lb) IBW/kg (Calculated) : 54.7  Temp (24hrs), Avg:99.8 F (37.7 C), Min:98.1 F (36.7 C), Max:101.1 F (38.4 C)  Recent Labs  Lab 04/19/22 1730 04/19/22 2033 04/19/22 2226 04/20/22 0150 04/20/22 0605  WBC 4.1  --   --  4.3  --   CREATININE 1.05*  --   --  1.40*  --   LATICACIDVEN  --  5.2* 6.7* 4.2* 3.8*     Estimated Creatinine Clearance: 42 mL/min (A) (by C-G formula based on SCr of 1.4 mg/dL (H)).    Allergies  Allergen Reactions   Other     Refuses whole blood but does accept albumin   Atorvastatin Other (See Comments)   Metronidazole Other (See Comments)    Chest pain, A-fib    Septra [Sulfamethoxazole-Trimethoprim] Other (See Comments)    Blisters     Antimicrobials this admission: Zosyn 6/12 >>  Vancomycin 6/12 >>  Microbiology results: 6/12 ucx:  6/12 bcx:   Thank you for allowing pharmacy to be a part of this patient's care.  Cristela Felt, PharmD, BCPS Clinical Pharmacist 04/20/2022 9:03 AM

## 2022-04-20 NOTE — ED Provider Notes (Signed)
Discussed with Dr. Hal Hope for admission  On my exam patient is resting comfortably no acute distress.  Overall her blood pressures appropriate.  Clinical exam reveals evidence of cellulitis to right lower extremity, distal pulses intact.  CT imaging negative for obvious signs of necrotizing fasciitis     Ripley Fraise, MD 04/20/22 0147

## 2022-04-20 NOTE — ED Notes (Signed)
Pt has complained multiple times that she needs to urinate. Pt has a purewick in place and pt has been educated multiple times on how to use the purewick however pt still hasn't urinated. Admit provider made aware of same and is requesting an in and out cath on pt

## 2022-04-20 NOTE — Progress Notes (Signed)
PROGRESS NOTE        PATIENT DETAILS Name: Kathleen Wiley Age: 76 y.o. Sex: female Date of Birth: [birth year] Admit Date: 04/19/2022 Admitting Physician Rise Patience, MD OMV:[id number], Ivin Booty, MD  Brief Summary: Patient is a 76 y.o.  female with history of DM-2, HTN, HLD who presented with fever, vomiting, and RLE erythema swelling-found to have cellulitis and subsequently admitted to the hospitalist service.  See below for further details.   Significant events: 6/12>> admit to [facility] for RLE swelling/cellulitis.  Significant studies: 6/12>> x-ray right tibia/fibula: Diffuse subcutaneous edema/swelling-no evidence of soft tissue gas. 6/12>> CXR: No PNA 6/13>> CT head: No acute abnormalities  Significant microbiology data: 6/12>> COVID/influenza PCR: Negative 6/12>> blood culture: No growth  Procedures: None  Consults: None  Subjective: Lying comfortably in bed-denies any chest pain or shortness of breath.  No further vomiting.  Claims RLE appears unchanged compared to yesterday (per patient she has had prior cellulitis-and has some chronic discoloration at baseline)  Objective: Vitals: Blood pressure (!) 93/52, pulse 85, temperature 99.7 F (37.6 C), temperature source Oral, resp. rate (!) 28, height '5\' 4"'$  (1.626 m), weight 106.6 kg, SpO2 97 %.   Exam: Gen Exam:Alert awake-not in any distress HEENT:atraumatic, normocephalic Chest: B/L clear to auscultation anteriorly CVS:S1S2 regular Abdomen:soft non tender, non distended Extremities: See picture below. Neurology: Non focal Skin: no rash     Pertinent Labs/Radiology:    Latest Ref Rng & Units 04/20/2022    1:50 AM 04/19/2022    5:30 PM 11/16/2021    7:29 PM  CBC  WBC 4.0 - 10.5 K/uL 4.3  4.1  3.3   Hemoglobin 12.0 - 15.0 g/dL 12.4  13.4  12.5   Hematocrit 36.0 - 46.0 % 37.8  40.4  38.2   Platelets 150 - 400 K/uL 78  PLATELET CLUMPS NOTED ON SMEAR, UNABLE TO ESTIMATE  146     Lab  Results  Component Value Date   NA 135 04/20/2022   K 3.8 04/20/2022   CL 99 04/20/2022   CO2 23 04/20/2022      Assessment/Plan: Sepsis due to RLE cellulitis: Sepsis physiology gradually improving-palpation-RLE are essentially unchanged compared to admission.  She apparently has had prior cellulitis in the same extremity and has chronic skin changes at baseline.  Continue empiric antibiotics-follow cultures-keep leg elevated as much as possible.  Check RLE Doppler to rule out DVT although I doubt.  Acute metabolic encephalopathy: Due to sepsis-she seems to be reasonably awake and alert this morning-hence probably improved than on initial presentation.  Neuroimaging negative.  DM-2 (A1c 5.2 on 6/30): Continue SSI-resume Ozempic on discharge.  Recent Labs    04/20/22 0820  GLUCAP 123*    HTN: Apparently not on any antihypertensives-any longer-BP currently soft but stable-watch for now.  HLD: Continue statin  Depression/anxiety: Resume Lexapro  Morbid Obesity Estimated body mass index is 40.34 kg/m as calculated from the following:   Height as of this encounter: '5\' 4"'$  (1.626 m).   Weight as of this encounter: 106.6 kg.   Code status:   Code Status: Full Code   DVT Prophylaxis: heparin injection 5,000 Units Start: 04/20/22 0600   Family Communication: None at bedside-patient prefers to update family herself.   Disposition Plan: Status is: Inpatient Remains inpatient appropriate because: Sepsis physiology due to RLE cellulitis-noted stable for discharge remains on  IV antibiotics.   Planned Discharge Destination:Home   Diet: Diet Order             Diet heart healthy/carb modified Room service appropriate? Yes; Fluid consistency: Thin  Diet effective now                     Antimicrobial agents: Anti-infectives (From admission, onward)    Start     Dose/Rate Route Frequency Ordered Stop   04/20/22 2200  vancomycin (VANCOCIN) IVPB 1000 mg/200 mL premix         1,000 mg 200 mL/hr over 60 Minutes Intravenous Every 24 hours 04/19/22 2204     04/20/22 0430  piperacillin-tazobactam (ZOSYN) IVPB 3.375 g       See Hyperspace for full Linked Orders Report.   3.375 g 12.5 mL/hr over 240 Minutes Intravenous Every 8 hours 04/19/22 2027     04/19/22 2030  vancomycin (VANCOREADY) IVPB 2000 mg/400 mL        2,000 mg 200 mL/hr over 120 Minutes Intravenous  Once 04/19/22 2027 04/19/22 2346   04/19/22 2030  piperacillin-tazobactam (ZOSYN) IVPB 3.375 g       See Hyperspace for full Linked Orders Report.   3.375 g 100 mL/hr over 30 Minutes Intravenous  Once 04/19/22 2027 04/19/22 2145   04/19/22 2015  vancomycin (VANCOCIN) IVPB 1000 mg/200 mL premix  Status:  Discontinued        1,000 mg 200 mL/hr over 60 Minutes Intravenous  Once 04/19/22 2005 04/19/22 2027        MEDICATIONS: Scheduled Meds:  heparin  5,000 Units Subcutaneous Q8H   insulin aspart  0-9 Units Subcutaneous TID WC   Continuous Infusions:  sodium chloride 150 mL/hr at 04/20/22 0520   piperacillin-tazobactam (ZOSYN)  IV Stopped (04/20/22 0803)   vancomycin     PRN Meds:.acetaminophen **OR** acetaminophen   I have personally reviewed following labs and imaging studies  LABORATORY DATA: CBC: Recent Labs  Lab 04/19/22 1730 04/20/22 0150  WBC 4.1 4.3  NEUTROABS 3.4 3.6  HGB 13.4 12.4  HCT 40.4 37.8  MCV 100.7* 103.0*  PLT PLATELET CLUMPS NOTED ON SMEAR, UNABLE TO ESTIMATE 78*    Basic Metabolic Panel: Recent Labs  Lab 04/19/22 1730 04/20/22 0150  NA 136 135  K 3.7 3.8  CL 102 99  CO2 23 23  GLUCOSE 177* 135*  BUN 18 22  CREATININE 1.05* 1.40*  CALCIUM 9.3 8.8*    GFR: Estimated Creatinine Clearance: 42 mL/min (A) (by C-G formula based on SCr of 1.4 mg/dL (H)).  Liver Function Tests: Recent Labs  Lab 04/19/22 1730 04/20/22 0150  AST 36 33  ALT 29 26  ALKPHOS 77 62  BILITOT 1.9* 2.0*  PROT 7.3 6.3*  ALBUMIN 4.0 3.4*   Recent Labs  Lab 04/19/22 1730   LIPASE 32   No results for input(s): "AMMONIA" in the last 168 hours.  Coagulation Profile: Recent Labs  Lab 04/19/22 2033  INR 1.2    Cardiac Enzymes: No results for input(s): "CKTOTAL", "CKMB", "CKMBINDEX", "TROPONINI" in the last 168 hours.  BNP (last 3 results) No results for input(s): "PROBNP" in the last 8760 hours.  Lipid Profile: No results for input(s): "CHOL", "HDL", "LDLCALC", "TRIG", "CHOLHDL", "LDLDIRECT" in the last 72 hours.  Thyroid Function Tests: No results for input(s): "TSH", "T4TOTAL", "FREET4", "T3FREE", "THYROIDAB" in the last 72 hours.  Anemia Panel: No results for input(s): "VITAMINB12", "FOLATE", "FERRITIN", "TIBC", "IRON", "RETICCTPCT" in the last 72 hours.  Urine analysis:    Component Value Date/Time   COLORURINE AMBER (A) 04/19/2022 2123   APPEARANCEUR HAZY (A) 04/19/2022 2123   LABSPEC 1.013 04/19/2022 2123   PHURINE 5.0 04/19/2022 2123   GLUCOSEU NEGATIVE 04/19/2022 2123   HGBUR NEGATIVE 04/19/2022 2123   BILIRUBINUR NEGATIVE 04/19/2022 2123   KETONESUR NEGATIVE 04/19/2022 2123   PROTEINUR NEGATIVE 04/19/2022 2123   UROBILINOGEN 1.0 08/06/2012 2046   NITRITE NEGATIVE 04/19/2022 2123   LEUKOCYTESUR NEGATIVE 04/19/2022 2123    Sepsis Labs: Lactic Acid, Venous    Component Value Date/Time   LATICACIDVEN 3.8 ([place]) 04/20/2022 0605    MICROBIOLOGY: Recent Results (from the past 240 hour(s))  Resp Panel by RT-PCR (Flu A&B, Covid) Anterior Nasal Swab     Status: None   Collection Time: 04/19/22  8:05 PM   Specimen: Anterior Nasal Swab  Result Value Ref Range Status   SARS Coronavirus 2 by RT PCR NEGATIVE NEGATIVE Final    Comment: (NOTE) SARS-CoV-2 target nucleic acids are NOT DETECTED.  The SARS-CoV-2 RNA is generally detectable in upper respiratory specimens during the acute phase of infection. The lowest concentration of SARS-CoV-2 viral copies this assay can detect is 138 copies/mL. A negative result does not preclude  SARS-Cov-2 infection and should not be used as the sole basis for treatment or other patient management decisions. A negative result may occur with  improper specimen collection/handling, submission of specimen other than nasopharyngeal swab, presence of viral mutation(s) within the areas targeted by this assay, and inadequate number of viral copies(<138 copies/mL). A negative result must be combined with clinical observations, patient history, and epidemiological information. The expected result is Negative.  Fact Sheet for Patients:  EntrepreneurPulse.com.au  Fact Sheet for Healthcare Providers:  IncredibleEmployment.be  This test is no t yet approved or cleared by the [place] FDA and  has been authorized for detection and/or diagnosis of SARS-CoV-2 by FDA under an Emergency Use Authorization (EUA). This EUA will remain  in effect (meaning this test can be used) for the duration of the COVID-19 declaration under Section 564(b)(1) of the Act, 21 U.S.C.section 360bbb-3(b)(1), unless the authorization is terminated  or revoked sooner.       Influenza A by PCR NEGATIVE NEGATIVE Final   Influenza B by PCR NEGATIVE NEGATIVE Final    Comment: (NOTE) The Xpert Xpress SARS-CoV-2/FLU/RSV plus assay is intended as an aid in the diagnosis of influenza from Nasopharyngeal swab specimens and should not be used as a sole basis for treatment. Nasal washings and aspirates are unacceptable for Xpert Xpress SARS-CoV-2/FLU/RSV testing.  Fact Sheet for Patients: EntrepreneurPulse.com.au  Fact Sheet for Healthcare Providers: IncredibleEmployment.be  This test is not yet approved or cleared by the [place] FDA and has been authorized for detection and/or diagnosis of SARS-CoV-2 by FDA under an Emergency Use Authorization (EUA). This EUA will remain in effect (meaning this test can be used) for the duration of  the COVID-19 declaration under Section 564(b)(1) of the Act, 21 U.S.C. section 360bbb-3(b)(1), unless the authorization is terminated or revoked.  Performed at [facility], [place] [address], [place], [place] [address]     RADIOLOGY STUDIES/RESULTS: CT HEAD WO CONTRAST (5MM)  Result Date: 04/20/2022 CLINICAL DATA:  76 year old female altered mental status. EXAM: CT HEAD WITHOUT CONTRAST TECHNIQUE: Contiguous axial images were obtained from the base of the skull through the vertex without intravenous contrast. RADIATION DOSE REDUCTION: This exam was performed according to the departmental dose-optimization program which includes automated exposure control,  adjustment of the mA and/or kV according to patient size and/or use of iterative reconstruction technique. COMPARISON:  None Available. FINDINGS: Brain: Cerebral volume is within normal limits for age. No midline shift, ventriculomegaly, mass effect, evidence of mass lesion, intracranial hemorrhage or evidence of cortically based acute infarction. Minimal to mild for age subcortical white matter hypodensity. No cortical encephalomalacia and otherwise normal gray-white differentiation. Vascular: Calcified atherosclerosis at the skull base. No suspicious intracranial vascular hyperdensity. Skull: No acute osseous abnormality identified. Anterior C1-C2 degeneration. Sinuses/Orbits: Right maxillary sinus mucoperiosteal thickening with superimposed fluid level. But the other paranasal sinuses are well aerated. Bilateral tympanic cavities and mastoids appear clear. Other: Postoperative changes to both globes. No acute orbit or scalp soft tissue finding. Negative visible noncontrast deep soft tissue spaces of the face. IMPRESSION: 1. No acute intracranial abnormality. Negative for age noncontrast CT appearance of the brain. 2. 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 Electronically Signed   By: Genevie Ann M.D.   On: 04/20/2022 06:53   CT TIBIA FIBULA  RIGHT W CONTRAST  Result Date: 04/19/2022 CLINICAL DATA:  Soft tissue infection. EXAM: CT OF THE LOWER RIGHT EXTREMITY WITH CONTRAST TECHNIQUE: Multidetector CT imaging of the lower right extremity was performed according to the standard protocol following intravenous contrast administration. RADIATION DOSE REDUCTION: This exam was performed according to the departmental dose-optimization program which includes automated exposure control, adjustment of the mA and/or kV according to patient size and/or use of iterative reconstruction technique. CONTRAST:  28m OMNIPAQUE IOHEXOL 300 MG/ML  SOLN COMPARISON:  X-ray same day FINDINGS: Bones/Joint/Cartilage There is no evidence for fracture or dislocation. Total knee arthroplasty appears in anatomic alignment without evidence for hardware loosening. No obvious joint effusions are seen. Ligaments Suboptimally assessed by CT. Muscles and Tendons Deep fascial planes appear preserved. Muscles and tendons are grossly within normal limits. No enhancing fluid collections. Soft tissues There is diffuse subcutaneous swelling and edema throughout the mid and distal lower extremity. There is no evidence for soft tissue gas or enhancing fluid collection. There are some nonspecific scattered subcutaneous calcifications. IMPRESSION: 1. Diffuse subcutaneous edema and swelling of the mid and distal lower extremity. Findings can be seen in the setting of cellulitis. No evidence for soft tissue gas, abscess, or deep fascial plane involvement. 2. No acute bony abnormality. 3. Knee arthroplasty appears uncomplicated. Electronically Signed   By: ARonney AstersM.D.   On: 04/19/2022 23:53   DG Tibia/Fibula Right Port  Result Date: 04/19/2022 CLINICAL DATA:  Fever, body aches EXAM: PORTABLE RIGHT TIBIA AND FIBULA - 2 VIEW COMPARISON:  None Available. FINDINGS: Changes of right knee replacement partially visualized. No acute bony abnormality. Specifically, no fracture, subluxation, or  dislocation. Soft tissues are intact. IMPRESSION: No acute bony abnormality. Electronically Signed   By: KRolm BaptiseM.D.   On: 04/19/2022 21:09   DG Chest Port 1 View  Result Date: 04/19/2022 CLINICAL DATA:  Questionable sepsis - evaluate for abnormality EXAM: PORTABLE CHEST 1 VIEW COMPARISON:  11/17/2021 FINDINGS: Cardiomegaly. No confluent airspace opacities, effusions or edema. Mediastinal contours within normal limits. No acute bony abnormality. IMPRESSION: Cardiomegaly.  No active disease. Electronically Signed   By: KRolm BaptiseM.D.   On: 04/19/2022 21:08     LOS: 0 days   SOren Binet MD  [facility]    To contact the attending provider between 7A-7P or the covering provider during after hours 7P-7A, please log into the web site [email or web address] and access using universal [place] password for  that web site. If you do not have the password, please call the hospital operator.  04/20/2022, 9:33 AM

## 2022-04-20 NOTE — Sepsis Progress Note (Signed)
Notified provider of need to order repeat lactic acid. ° °

## 2022-04-20 NOTE — Progress Notes (Signed)
PHARMACY - PHYSICIAN COMMUNICATION CRITICAL VALUE ALERT - BLOOD CULTURE IDENTIFICATION (BCID)  Kathleen Wiley is an 76 y.o. female who presented to [facility] on 04/19/2022 with a chief complaint of N/V, fever, chills  Assessment:  4 YOF with RLE concerning for cellulitis and potential cause of sepsis. Now with both bottle of 1 set (2 of 4 total bottles) growing GPC in chains with BCID detecting Strep species. This could be representative of a contaminant since only in 1 set or pathogen related to the RLE cellulitis.   Name of physician (or Provider) Contacted: Ghimire  Current antibiotics: Vancomycin + Zosyn  Changes to prescribed antibiotics recommended:  Narrow Zosyn to Rocephin 2g IV every 24 hours, continue Vancomycin for now pending additional culture data and/or updates.   Results for orders placed or performed during the hospital encounter of 04/19/22  Blood Culture ID Panel (Reflexed) (Collected: 04/19/2022  8:33 PM)  Result Value Ref Range   Enterococcus faecalis NOT DETECTED NOT DETECTED   Enterococcus Faecium NOT DETECTED NOT DETECTED   Listeria monocytogenes NOT DETECTED NOT DETECTED   Staphylococcus species NOT DETECTED NOT DETECTED   Staphylococcus aureus (BCID) NOT DETECTED NOT DETECTED   Staphylococcus epidermidis NOT DETECTED NOT DETECTED   Staphylococcus lugdunensis NOT DETECTED NOT DETECTED   Streptococcus species DETECTED (A) NOT DETECTED   Streptococcus agalactiae NOT DETECTED NOT DETECTED   Streptococcus pneumoniae NOT DETECTED NOT DETECTED   Streptococcus pyogenes NOT DETECTED NOT DETECTED   A.calcoaceticus-baumannii NOT DETECTED NOT DETECTED   Bacteroides fragilis NOT DETECTED NOT DETECTED   Enterobacterales NOT DETECTED NOT DETECTED   Enterobacter cloacae complex NOT DETECTED NOT DETECTED   Escherichia coli NOT DETECTED NOT DETECTED   Klebsiella aerogenes NOT DETECTED NOT DETECTED   Klebsiella oxytoca NOT DETECTED NOT DETECTED   Klebsiella pneumoniae NOT  DETECTED NOT DETECTED   Proteus species NOT DETECTED NOT DETECTED   Salmonella species NOT DETECTED NOT DETECTED   Serratia marcescens NOT DETECTED NOT DETECTED   Haemophilus influenzae NOT DETECTED NOT DETECTED   Neisseria meningitidis NOT DETECTED NOT DETECTED   Pseudomonas aeruginosa NOT DETECTED NOT DETECTED   Stenotrophomonas maltophilia NOT DETECTED NOT DETECTED   Candida albicans NOT DETECTED NOT DETECTED   Candida auris NOT DETECTED NOT DETECTED   Candida glabrata NOT DETECTED NOT DETECTED   Candida krusei NOT DETECTED NOT DETECTED   Candida parapsilosis NOT DETECTED NOT DETECTED   Candida tropicalis NOT DETECTED NOT DETECTED   Cryptococcus neoformans/gattii NOT DETECTED NOT DETECTED   Thank you for allowing pharmacy to be a part of this patient's care.  Alycia Rossetti, PharmD, BCPS Infectious Diseases Clinical Pharmacist 04/20/2022 2:07 PM   **Pharmacist phone directory can now be found on [email or web address] ([email or web address]).  Listed under [place].

## 2022-04-20 NOTE — ED Notes (Signed)
Lab at bedside at this time.  

## 2022-04-20 NOTE — ED Notes (Signed)
ED provider made aware of pt current temp and pt requesting to have something to drink

## 2022-04-20 NOTE — Progress Notes (Signed)
   04/20/22 1755  Assess: MEWS Score  Temp 99.6 F (37.6 C)  BP (!) 88/44  MAP (mmHg) (!) 57  Pulse Rate 88  ECG Heart Rate 88  Resp 20  SpO2 99 %  Assess: MEWS Score  MEWS Temp 0  MEWS Systolic 1  MEWS Pulse 0  MEWS RR 0  MEWS LOC 0  MEWS Score 1  MEWS Score Color Green  Assess: SIRS CRITERIA  SIRS Temperature  0  SIRS Pulse 0  SIRS Respirations  0  SIRS WBC 0  SIRS Score Sum  0    Dr. Evalee Mutton Ghimire notified of BP.  Patient is alert and oriented sitting up in bed eating.  Will monitor.

## 2022-04-20 NOTE — ED Notes (Signed)
Verbal report given to [facility] C RN at this time

## 2022-04-20 NOTE — ED Notes (Addendum)
ED TO INPATIENT HANDOFF REPORT  ED Nurse Name and Phone #: Zadrian Mccauley RN [phone]  S Name/Age/Gender Kathleen Wiley 76 y.o. female Room/Bed: 041C/041C  Code Status   Code Status: Full Code  Home/SNF/Other Home Patient oriented to: self, place, time, and situation Is this baseline? Yes   Triage Complete: Triage complete  Chief Complaint Sepsis ([place]) [A41.9] Cellulitis [L03.90]  Triage Note Pt reports body aches and n/v all of a sudden this morning. Pt unable to keep anything down today. Pt temp 101 F.    Allergies Allergies  Allergen Reactions   Other     Refuses whole blood but does accept albumin   Atorvastatin Other (See Comments)   Metronidazole Other (See Comments)    Chest pain, A-fib    Septra [Sulfamethoxazole-Trimethoprim] Other (See Comments)    Blisters     Level of Care/Admitting Diagnosis ED Disposition     ED Disposition  Admit   Condition  --   [place]: [facility] [[id number]]  Level of Care: Telemetry Medical [104]  May admit patient to [facility] or [facility] if equivalent level of care is available:: No  Covid Evaluation: Confirmed COVID Positive  Diagnosis: Cellulitis [973532]  Admitting Physician: Jonetta Osgood [[id number]]  Attending Physician: Jonetta Osgood [[id number]]  Estimated length of stay: past midnight tomorrow  Certification:: I certify this patient will need inpatient services for at least 2 midnights  Bed request comments: prefer 5 west          B Medical/Surgery History Past Medical History:  Diagnosis Date   Anxiety    Arthritis    knee, hip   Cellulitis 08/2016   left leg   Complication of anesthesia    Diabetes mellitus without complication (HCC)    GERD (gastroesophageal reflux disease)    Heart murmur    "slight "  informed years ago.-    Hernia, umbilical    History of atrial fibrillation 2007   with flagyl   History of endometriosis    Hypercholesteremia    Hypertension     has not been to a cardiologist   PONV (postoperative nausea and vomiting)    Rectal vaginal fistula    Refusal of blood transfusions as patient is Jehovah's Witness    Sleep apnea    Past Surgical History:  Procedure Laterality Date   APPENDECTOMY     BUNIONECTOMY Right 01/09/2019   Procedure: RIGHT FOOT BUNION CORRECTION;  Surgeon: Erle Crocker, MD;  Location: [facility];  Service: Orthopedics;  Laterality: Right;   CHOLECYSTECTOMY N/A 03/16/2021   Procedure: LAPAROSCOPIC CHOLECYSTECTOMY;  Surgeon: Stark Klein, MD;  [facility];  Service: General;  Laterality: N/A;   ENDOSCOPIC RETROGRADE CHOLANGIOPANCREATOGRAPHY (ERCP) WITH PROPOFOL N/A 03/15/2021   Procedure: ENDOSCOPIC RETROGRADE CHOLANGIOPANCREATOGRAPHY (ERCP) WITH PROPOFOL;  Surgeon: Ronnette Juniper, MD;  [facility];  Service: Gastroenterology;  Laterality: N/A;   EXPLORATORY LAPAROTOMY  1973   for endometrosis   HAMMERTOE RECONSTRUCTION WITH WEIL OSTEOTOMY Right 01/09/2019   Procedure: RIGHT FOOT 2-4 HAMMERTOE CORRECTION WITH WEIL OSTEOTOMY 2ND METATARSAL, DEEP TENDON TRANSFER WITH AKIN OSTEOTOMY;  Surgeon: Erle Crocker, MD;  Location: [facility];  Service: Orthopedics;  Laterality: Right;   I & D EXTREMITY Right 06/10/2016   Procedure: IRRIGATION AND DEBRIDEMENT THUMB FLEXOR SHEATH;  Surgeon: Leanora Cover, MD;  Location: [facility];  Service: Orthopedics;  Laterality: Right;   JOINT REPLACEMENT     Left Hip   MANDIBLE SURGERY  1978   rectovaginal fistula repair  1980   REMOVAL OF STONES  03/15/2021   Procedure: REMOVAL OF STONES;  Surgeon: Ronnette Juniper, MD;  [facility];  Service: Gastroenterology;;   Joan Mayans  03/15/2021   Procedure: Joan Mayans;  Surgeon: Ronnette Juniper, MD;  Location[facility];  Service: Gastroenterology;;   TOTAL HIP ARTHROPLASTY  05/08/2012   Procedure: TOTAL HIP ARTHROPLASTY;  Surgeon: Kerin Salen, MD;  Location: [facility];  Service: Orthopedics;  Laterality: Left;   TOTAL HIP  ARTHROPLASTY Right 09/18/2018   Procedure: TOTAL HIP ARTHROPLASTY ANTERIOR APPROACH;  Surgeon: Frederik Pear, MD;  [facility];  Service: Orthopedics;  Laterality: Right;   TOTAL KNEE ARTHROPLASTY Right 01/24/2013   Dr Mayer Camel   TOTAL KNEE ARTHROPLASTY Right 01/24/2013   Procedure: RIGHT TOTAL KNEE ARTHROPLASTY;  Surgeon: Kerin Salen, MD;  Location: [facility];  Service: Orthopedics;  Laterality: Right;     A IV Location/Drains/Wounds Patient Lines/Drains/Airways Status     Active Line/Drains/Airways     Name Placement date Placement time Site Days   Peripheral IV 04/19/22 20 G Left Antecubital 04/19/22  2024  Antecubital  1   Peripheral IV 04/19/22 Anterior;Left Wrist 04/19/22  2033  Wrist  1   External Urinary Catheter 04/20/22  0140  --  less than 1   Incision (Closed) 03/16/21 Abdomen 03/16/21  1347  -- 400   Incision - 4 Ports Abdomen 1: Umbilicus 2: Mid;Upper 3: Right;Upper 4: Right;Mid 03/16/21  1452  -- 400            Intake/Output Last 24 hours  Intake/Output Summary (Last 24 hours) at 04/20/2022 1539 Last data filed at 04/20/2022 1761 Gross per 24 hour  Intake 2180.23 ml  Output 1200 ml  Net 980.23 ml    Labs/Imaging Results for orders placed or performed during the hospital encounter of 04/19/22 (from the past 48 hour(s))  CBC with Differential     Status: Abnormal   Collection Time: 04/19/22  5:30 PM  Result Value Ref Range   WBC 4.1 4.0 - 10.5 K/uL   RBC 4.01 3.87 - 5.11 MIL/uL   Hemoglobin 13.4 12.0 - 15.0 g/dL   HCT 40.4 36.0 - 46.0 %   MCV 100.7 (H) 80.0 - 100.0 fL   MCH 33.4 26.0 - 34.0 pg   MCHC 33.2 30.0 - 36.0 g/dL   RDW 14.1 11.5 - 15.5 %   Platelets PLATELET CLUMPS NOTED ON SMEAR, UNABLE TO ESTIMATE 150 - 400 K/uL    Comment: Immature Platelet Fraction may be clinically indicated, consider ordering this additional test [id number] REPEATED TO VERIFY    nRBC 0.0 0.0 - 0.2 %   Neutrophils Relative % 81 %   Neutro Abs 3.4 1.7 - 7.7 K/uL    Lymphocytes Relative 7 %   Lymphs Abs 0.3 (L) 0.7 - 4.0 K/uL   Monocytes Relative 5 %   Monocytes Absolute 0.2 0.1 - 1.0 K/uL   Eosinophils Relative 0 %   Eosinophils Absolute 0.0 0.0 - 0.5 K/uL   Basophils Relative 2 %   Basophils Absolute 0.1 0.0 - 0.1 K/uL   Immature Granulocytes 5 %   Abs Immature Granulocytes 0.22 (H) 0.00 - 0.07 K/uL    Comment: Performed at [facility], [address], [place], [place] [address]  Comprehensive metabolic panel     Status: Abnormal   Collection Time: 04/19/22  5:30 PM  Result Value Ref Range   Sodium 136 135 - 145  mmol/L   Potassium 3.7 3.5 - 5.1 mmol/L   Chloride 102 98 - 111 mmol/L   CO2 23 22 - 32 mmol/L   Glucose, Bld 177 (H) 70 - 99 mg/dL    Comment: Glucose reference range applies only to samples taken after fasting for at least 8 hours.   BUN 18 8 - 23 mg/dL   Creatinine, Ser 1.05 (H) 0.44 - 1.00 mg/dL   Calcium 9.3 8.9 - 10.3 mg/dL   Total Protein 7.3 6.5 - 8.1 g/dL   Albumin 4.0 3.5 - 5.0 g/dL   AST 36 15 - 41 U/L   ALT 29 0 - 44 U/L   Alkaline Phosphatase 77 38 - 126 U/L   Total Bilirubin 1.9 (H) 0.3 - 1.2 mg/dL   GFR, Estimated 56 (L) >60 mL/min    Comment: (NOTE) Calculated using the CKD-EPI Creatinine Equation (2021)    Anion gap 11 5 - 15    Comment: Performed at [facility] [address], [place], [place] [address]  Lipase, blood     Status: None   Collection Time: 04/19/22  5:30 PM  Result Value Ref Range   Lipase 32 11 - 51 U/L    Comment: Performed at [facility] [address] [place], [place] [address]  CBG monitoring, ED     Status: Abnormal   Collection Time: 04/19/22  5:44 PM  Result Value Ref Range   Glucose-Capillary 170 (H) 70 - 99 mg/dL    Comment: Glucose reference range applies only to samples taken after fasting for at least 8 hours.  Resp Panel by RT-PCR (Flu A&B, Covid) Anterior Nasal Swab     Status: None   Collection Time: 04/19/22  8:05 PM   Specimen: Anterior Nasal Swab   Result Value Ref Range   SARS Coronavirus 2 by RT PCR NEGATIVE NEGATIVE    Comment: (NOTE) SARS-CoV-2 target nucleic acids are NOT DETECTED.  The SARS-CoV-2 RNA is generally detectable in upper respiratory specimens during the acute phase of infection. The lowest concentration of SARS-CoV-2 viral copies this assay can detect is 138 copies/mL. A negative result does not preclude SARS-Cov-2 infection and should not be used as the sole basis for treatment or other patient management decisions. A negative result may occur with  improper specimen collection/handling, submission of specimen other than nasopharyngeal swab, presence of viral mutation(s) within the areas targeted by this assay, and inadequate number of viral copies(<138 copies/mL). A negative result must be combined with clinical observations, patient history, and epidemiological information. The expected result is Negative.  Fact Sheet for Patients:  EntrepreneurPulse.com.au  Fact Sheet for Healthcare Providers:  IncredibleEmployment.be  This test is no t yet approved or cleared by the [place] FDA and  has been authorized for detection and/or diagnosis of SARS-CoV-2 by FDA under an Emergency Use Authorization (EUA). This EUA will remain  in effect (meaning this test can be used) for the duration of the COVID-19 declaration under Section 564(b)(1) of the Act, 21 U.S.C.section 360bbb-3(b)(1), unless the authorization is terminated  or revoked sooner.       Influenza A by PCR NEGATIVE NEGATIVE   Influenza B by PCR NEGATIVE NEGATIVE    Comment: (NOTE) The Xpert Xpress SARS-CoV-2/FLU/RSV plus assay is intended as an aid in the diagnosis of influenza from Nasopharyngeal swab specimens and should not be used as a sole basis for treatment. Nasal washings and aspirates are unacceptable for Xpert Xpress SARS-CoV-2/FLU/RSV testing.  Fact  Sheet for  Patients: EntrepreneurPulse.com.au  Fact Sheet for Healthcare Providers: IncredibleEmployment.be  This test is not yet approved or cleared by the [place] FDA and has been authorized for detection and/or diagnosis of SARS-CoV-2 by FDA under an Emergency Use Authorization (EUA). This EUA will remain in effect (meaning this test can be used) for the duration of the COVID-19 declaration under Section 564(b)(1) of the Act, 21 U.S.C. section 360bbb-3(b)(1), unless the authorization is terminated or revoked.  Performed at [facility], [place] [address], [place], [place] [address]   Blood Culture (routine x 2)     Status: None (Preliminary result)   Collection Time: 04/19/22  8:24 PM   Specimen: BLOOD  Result Value Ref Range   Specimen Description BLOOD RIGHT ANTECUBITAL    Special Requests      BOTTLES DRAWN AEROBIC AND ANAEROBIC Blood Culture adequate volume   Culture      NO GROWTH < 24 HOURS Performed at [facility], [place] [address], [place], [place] [address]    Report Status PENDING   Lactic acid, plasma     Status: Abnormal   Collection Time: 04/19/22  8:33 PM  Result Value Ref Range   Lactic Acid, Venous 5.2 (HH) 0.5 - 1.9 mmol/L    Comment: CRITICAL RESULT CALLED TO, READ BACK BY AND VERIFIED WITH:  Rayburn Felt RN , 2140, 04/19/22, EADEDOKUN Performed at [facility], [place] [address], [place], [place] [address]   Protime-INR     Status: Abnormal   Collection Time: 04/19/22  8:33 PM  Result Value Ref Range   Prothrombin Time 15.4 (H) 11.4 - 15.2 seconds   INR 1.2 0.8 - 1.2    Comment: (NOTE) INR goal varies based on device and disease states. Performed at [facility], [place] [address] [place], [place] [address]   APTT     Status: None   Collection Time: 04/19/22  8:33 PM  Result Value Ref Range   aPTT 27 24 - 36 seconds    Comment: Performed at [facility] [address] [place], [place] [address]   Blood Culture (routine x 2)     Status: None (Preliminary result)   Collection Time: 04/19/22  8:33 PM   Specimen: BLOOD  Result Value Ref Range   Specimen Description BLOOD RIGHT ANTECUBITAL    Special Requests      BOTTLES DRAWN AEROBIC AND ANAEROBIC Blood Culture adequate volume   Culture  Setup Time      GRAM POSITIVE COCCI IN CHAINS IN BOTH AEROBIC AND ANAEROBIC BOTTLES Organism ID to follow CRITICAL RESULT CALLED TO, READ BACK BY AND VERIFIED WITH:  C/ ELIZABETH M., PHARMD 04/20/22 1254 A. LAFRANCE Performed at [facility], [place] [address] [place], [place] [address]    Culture GRAM POSITIVE COCCI    Report Status PENDING   Blood Culture ID Panel (Reflexed)     Status: Abnormal   Collection Time: 04/19/22  8:33 PM  Result Value Ref Range   Enterococcus faecalis NOT DETECTED NOT DETECTED   Enterococcus Faecium NOT DETECTED NOT DETECTED   Listeria monocytogenes NOT DETECTED NOT DETECTED   Staphylococcus species NOT DETECTED NOT DETECTED   Staphylococcus aureus (BCID) NOT DETECTED NOT DETECTED   Staphylococcus epidermidis NOT DETECTED NOT DETECTED   Staphylococcus lugdunensis NOT DETECTED NOT DETECTED   Streptococcus species DETECTED (A) NOT DETECTED    Comment: Not Enterococcus species, Streptococcus agalactiae, Streptococcus pyogenes, or Streptococcus  pneumoniae. CRITICAL RESULT CALLED TO, READ BACK BY AND VERIFIED WITH:  C/ ELIZABETH M., PHARMD 04/20/22 1254 A. LAFRANCE    Streptococcus agalactiae NOT DETECTED NOT DETECTED   Streptococcus pneumoniae NOT DETECTED NOT DETECTED   Streptococcus pyogenes NOT DETECTED NOT DETECTED   A.calcoaceticus-baumannii NOT DETECTED NOT DETECTED   Bacteroides fragilis NOT DETECTED NOT DETECTED   Enterobacterales NOT DETECTED NOT DETECTED   Enterobacter cloacae complex NOT DETECTED NOT DETECTED   Escherichia coli NOT DETECTED NOT DETECTED   Klebsiella aerogenes NOT DETECTED NOT DETECTED   Klebsiella oxytoca NOT DETECTED NOT  DETECTED   Klebsiella pneumoniae NOT DETECTED NOT DETECTED   Proteus species NOT DETECTED NOT DETECTED   Salmonella species NOT DETECTED NOT DETECTED   Serratia marcescens NOT DETECTED NOT DETECTED   Haemophilus influenzae NOT DETECTED NOT DETECTED   Neisseria meningitidis NOT DETECTED NOT DETECTED   Pseudomonas aeruginosa NOT DETECTED NOT DETECTED   Stenotrophomonas maltophilia NOT DETECTED NOT DETECTED   Candida albicans NOT DETECTED NOT DETECTED   Candida auris NOT DETECTED NOT DETECTED   Candida glabrata NOT DETECTED NOT DETECTED   Candida krusei NOT DETECTED NOT DETECTED   Candida parapsilosis NOT DETECTED NOT DETECTED   Candida tropicalis NOT DETECTED NOT DETECTED   Cryptococcus neoformans/gattii NOT DETECTED NOT DETECTED    Comment: Performed at [facility], [place] [address], [place], [place] [address]  Urinalysis, Routine w reflex microscopic Anterior Nasal Swab     Status: Abnormal   Collection Time: 04/19/22  9:23 PM  Result Value Ref Range   Color, Urine AMBER (A) YELLOW    Comment: BIOCHEMICALS MAY BE AFFECTED BY COLOR   APPearance HAZY (A) CLEAR   Specific Gravity, Urine 1.013 1.005 - 1.030   pH 5.0 5.0 - 8.0   Glucose, UA NEGATIVE NEGATIVE mg/dL   Hgb urine dipstick NEGATIVE NEGATIVE   Bilirubin Urine NEGATIVE NEGATIVE   Ketones, ur NEGATIVE NEGATIVE mg/dL   Protein, ur NEGATIVE NEGATIVE mg/dL   Nitrite NEGATIVE NEGATIVE   Leukocytes,Ua NEGATIVE NEGATIVE    Comment: Performed at [facility] [address] [place], [place] [address]  Lactic acid, plasma     Status: Abnormal   Collection Time: 04/19/22 10:26 PM  Result Value Ref Range   Lactic Acid, Venous 6.7 (HH) 0.5 - 1.9 mmol/L    Comment: CRITICAL VALUE NOTED.  VALUE IS CONSISTENT WITH PREVIOUSLY REPORTED AND CALLED VALUE. Performed at [facility], [place] [address] [place], [place] [address]   Lactic acid, plasma     Status: Abnormal   Collection Time: 04/20/22  1:50 AM  Result Value Ref  Range   Lactic Acid, Venous 4.2 (HH) 0.5 - 1.9 mmol/L    Comment: CRITICAL VALUE NOTED.  VALUE IS CONSISTENT WITH PREVIOUSLY REPORTED AND CALLED VALUE. Performed at [facility], [place] [address], [place], [place] [address]   Comprehensive metabolic panel     Status: Abnormal   Collection Time: 04/20/22  1:50 AM  Result Value Ref Range   Sodium 135 135 - 145 mmol/L   Potassium 3.8 3.5 - 5.1 mmol/L   Chloride 99 98 - 111 mmol/L   CO2 23 22 - 32 mmol/L   Glucose, Bld 135 (H) 70 - 99 mg/dL    Comment: Glucose reference range applies only to samples taken after fasting for at least 8 hours.   BUN 22 8 - 23 mg/dL   Creatinine, Ser 1.40 (H) 0.44 - 1.00 mg/dL  Calcium 8.8 (L) 8.9 - 10.3 mg/dL   Total Protein 6.3 (L) 6.5 - 8.1 g/dL   Albumin 3.4 (L) 3.5 - 5.0 g/dL   AST 33 15 - 41 U/L   ALT 26 0 - 44 U/L   Alkaline Phosphatase 62 38 - 126 U/L   Total Bilirubin 2.0 (H) 0.3 - 1.2 mg/dL   GFR, Estimated 39 (L) >60 mL/min    Comment: (NOTE) Calculated using the CKD-EPI Creatinine Equation (2021)    Anion gap 13 5 - 15    Comment: Performed at [facility] [address], [place], [place] [address]  CBC with Differential/Platelet     Status: Abnormal   Collection Time: 04/20/22  1:50 AM  Result Value Ref Range   WBC 4.3 4.0 - 10.5 K/uL   RBC 3.67 (L) 3.87 - 5.11 MIL/uL   Hemoglobin 12.4 12.0 - 15.0 g/dL   HCT 37.8 36.0 - 46.0 %   MCV 103.0 (H) 80.0 - 100.0 fL   MCH 33.8 26.0 - 34.0 pg   MCHC 32.8 30.0 - 36.0 g/dL   RDW 14.5 11.5 - 15.5 %   Platelets 78 (L) 150 - 400 K/uL    Comment: Immature Platelet Fraction may be clinically indicated, consider ordering this additional test [id number] REPEATED TO VERIFY PLATELET COUNT CONFIRMED BY SMEAR    nRBC 0.5 (H) 0.0 - 0.2 %   Neutrophils Relative % 83 %   Neutro Abs 3.6 1.7 - 7.7 K/uL   Lymphocytes Relative 7 %   Lymphs Abs 0.3 (L) 0.7 - 4.0 K/uL   Monocytes Relative 6 %   Monocytes Absolute 0.3 0.1 - 1.0 K/uL    Eosinophils Relative 0 %   Eosinophils Absolute 0.0 0.0 - 0.5 K/uL   Basophils Relative 2 %   Basophils Absolute 0.1 0.0 - 0.1 K/uL   WBC Morphology INCREASED BANDS (>20% BANDS)     Comment: VACUOLATED NEUTROPHILS   Smear Review PLATELETS APPEAR DECREASED    Immature Granulocytes 2 %   Abs Immature Granulocytes 0.10 (H) 0.00 - 0.07 K/uL   Polychromasia PRESENT     Comment: Performed at [facility], [place] [address] [place], [place] [address]  Hemoglobin A1c     Status: None   Collection Time: 04/20/22  1:50 AM  Result Value Ref Range   Hgb A1c MFr Bld 5.2 4.8 - 5.6 %    Comment: (NOTE) Pre diabetes:          5.7%-6.4%  Diabetes:              >6.4%  Glycemic control for   <7.0% adults with diabetes    Mean Plasma Glucose 102.54 mg/dL    Comment: Performed at [facility] [address], [place], [place] [address]  Lactic acid, plasma     Status: Abnormal   Collection Time: 04/20/22  6:05 AM  Result Value Ref Range   Lactic Acid, Venous 3.8 (HH) 0.5 - 1.9 mmol/L    Comment: CRITICAL VALUE NOTED.  VALUE IS CONSISTENT WITH PREVIOUSLY REPORTED AND CALLED VALUE. Performed at [facility], [place] [address], [place], [place] [address]   POC CBG, ED     Status: Abnormal   Collection Time: 04/20/22  8:20 AM  Result Value Ref Range   Glucose-Capillary 123 (H) 70 - 99 mg/dL    Comment: Glucose reference range applies only to samples taken after fasting for at least 8 hours.  CBG monitoring,  ED     Status: None   Collection Time: 04/20/22 11:55 AM  Result Value Ref Range   Glucose-Capillary 73 70 - 99 mg/dL    Comment: Glucose reference range applies only to samples taken after fasting for at least 8 hours.   Comment 1 Notify RN    Comment 2 Document in Chart    VAS [place] LOWER EXTREMITY VENOUS (DVT)  Result Date: 04/20/2022  Lower Venous DVT Study Patient Name:  Kathleen Wiley  Date of Exam:   04/20/2022 Medical Rec #: [id number]        Accession #:    [id number] Date  of Birth: 1946-05-19        Patient Gender: F Patient Age:   12 years Exam Location:  [facility] Procedure:      VAS [place] LOWER EXTREMITY VENOUS (DVT) Referring Phys: Oren Binet --------------------------------------------------------------------------------  Indications: Edema, Erythema, and cellulitis.  Limitations: Poor ultrasound/tissue interface and body habitus. Comparison Study: 08/11/2016- negative bilateral lower extremity venous duplex Performing Technologist: Maudry Mayhew MHA, RDMS, RVT, RDCS  Examination Guidelines: A complete evaluation includes B-mode imaging, spectral Doppler, color Doppler, and power Doppler as needed of all accessible portions of each vessel. Bilateral testing is considered an integral part of a complete examination. Limited examinations for reoccurring indications may be performed as noted. The reflux portion of the exam is performed with the patient in reverse Trendelenburg.  +---------+---------------+---------+-----------+----------+--------------+ RIGHT    CompressibilityPhasicitySpontaneityPropertiesThrombus Aging +---------+---------------+---------+-----------+----------+--------------+ CFV      Full           Yes      Yes                                 +---------+---------------+---------+-----------+----------+--------------+ SFJ      Full                                                        +---------+---------------+---------+-----------+----------+--------------+ FV Prox  Full                                                        +---------+---------------+---------+-----------+----------+--------------+ FV Mid   Full                                                        +---------+---------------+---------+-----------+----------+--------------+ FV DistalFull                                                        +---------+---------------+---------+-----------+----------+--------------+ PFV      Full                                                         +---------+---------------+---------+-----------+----------+--------------+  POP      Full           Yes      Yes                                 +---------+---------------+---------+-----------+----------+--------------+   Right Technical Findings: Not visualized segments include Inadequate visualization posterior tibial and peroneal veins.  +----+---------------+---------+-----------+----------+--------------+ LEFTCompressibilityPhasicitySpontaneityPropertiesThrombus Aging +----+---------------+---------+-----------+----------+--------------+ CFV Full           Yes      Yes                                 +----+---------------+---------+-----------+----------+--------------+     Summary: RIGHT: - There is no evidence of deep vein thrombosis in the lower extremity. However, portions of this examination were limited- see technologist comments above.  - No cystic structure found in the popliteal fossa.  LEFT: - No evidence of common femoral vein obstruction.  *See table(s) above for measurements and observations. Electronically signed by Deitra Mayo MD on 04/20/2022 at 3:13:07 PM.    Final    CT HEAD WO CONTRAST (5MM)  Result Date: 04/20/2022 CLINICAL DATA:  76 year old female altered mental status. EXAM: CT HEAD WITHOUT CONTRAST TECHNIQUE: Contiguous axial images were obtained from the base of the skull through the vertex without intravenous contrast. RADIATION DOSE REDUCTION: This exam was performed according to the departmental dose-optimization program which includes automated exposure control, adjustment of the mA and/or kV according to patient size and/or use of iterative reconstruction technique. COMPARISON:  None Available. FINDINGS: Brain: Cerebral volume is within normal limits for age. No midline shift, ventriculomegaly, mass effect, evidence of mass lesion, intracranial hemorrhage or evidence of cortically based acute  infarction. Minimal to mild for age subcortical white matter hypodensity. No cortical encephalomalacia and otherwise normal gray-white differentiation. Vascular: Calcified atherosclerosis at the skull base. No suspicious intracranial vascular hyperdensity. Skull: No acute osseous abnormality identified. Anterior C1-C2 degeneration. Sinuses/Orbits: Right maxillary sinus mucoperiosteal thickening with superimposed fluid level. But the other paranasal sinuses are well aerated. Bilateral tympanic cavities and mastoids appear clear. Other: Postoperative changes to both globes. No acute orbit or scalp soft tissue finding. Negative visible noncontrast deep soft tissue spaces of the face. IMPRESSION: 1. No acute intracranial abnormality. Negative for age noncontrast CT appearance of the brain. 2. 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 Electronically Signed   By: Genevie Ann M.D.   On: 04/20/2022 06:53   CT TIBIA FIBULA RIGHT W CONTRAST  Result Date: 04/19/2022 CLINICAL DATA:  Soft tissue infection. EXAM: CT OF THE LOWER RIGHT EXTREMITY WITH CONTRAST TECHNIQUE: Multidetector CT imaging of the lower right extremity was performed according to the standard protocol following intravenous contrast administration. RADIATION DOSE REDUCTION: This exam was performed according to the departmental dose-optimization program which includes automated exposure control, adjustment of the mA and/or kV according to patient size and/or use of iterative reconstruction technique. CONTRAST:  12m OMNIPAQUE IOHEXOL 300 MG/ML  SOLN COMPARISON:  X-ray same day FINDINGS: Bones/Joint/Cartilage There is no evidence for fracture or dislocation. Total knee arthroplasty appears in anatomic alignment without evidence for hardware loosening. No obvious joint effusions are seen. Ligaments Suboptimally assessed by CT. Muscles and Tendons Deep fascial planes appear preserved. Muscles and tendons are grossly within normal limits. No enhancing fluid  collections. Soft tissues There is diffuse subcutaneous swelling and edema throughout the mid and distal  lower extremity. There is no evidence for soft tissue gas or enhancing fluid collection. There are some nonspecific scattered subcutaneous calcifications. IMPRESSION: 1. Diffuse subcutaneous edema and swelling of the mid and distal lower extremity. Findings can be seen in the setting of cellulitis. No evidence for soft tissue gas, abscess, or deep fascial plane involvement. 2. No acute bony abnormality. 3. Knee arthroplasty appears uncomplicated. Electronically Signed   By: Ronney Asters M.D.   On: 04/19/2022 23:53   DG Tibia/Fibula Right Port  Result Date: 04/19/2022 CLINICAL DATA:  Fever, body aches EXAM: PORTABLE RIGHT TIBIA AND FIBULA - 2 VIEW COMPARISON:  None Available. FINDINGS: Changes of right knee replacement partially visualized. No acute bony abnormality. Specifically, no fracture, subluxation, or dislocation. Soft tissues are intact. IMPRESSION: No acute bony abnormality. Electronically Signed   By: Rolm Baptise M.D.   On: 04/19/2022 21:09   DG Chest Port 1 View  Result Date: 04/19/2022 CLINICAL DATA:  Questionable sepsis - evaluate for abnormality EXAM: PORTABLE CHEST 1 VIEW COMPARISON:  11/17/2021 FINDINGS: Cardiomegaly. No confluent airspace opacities, effusions or edema. Mediastinal contours within normal limits. No acute bony abnormality. IMPRESSION: Cardiomegaly.  No active disease. Electronically Signed   By: Rolm Baptise M.D.   On: 04/19/2022 21:08    Pending Labs Unresulted Labs (From admission, onward)     Start     Ordered   04/21/22 0500  CBC  Tomorrow morning,   R        04/20/22 0945   04/21/22 0500  Comprehensive metabolic panel  Tomorrow morning,   R        04/20/22 0945   04/20/22 0455  Lactic acid, plasma  STAT Now then every 3 hours,   R (with STAT occurrences)      04/20/22 0454   04/20/22 0429  CBC  (heparin)  Once,   R       Comments: Baseline for heparin  therapy IF NOT ALREADY DRAWN.  Notify MD if PLT < 100 K.    04/20/22 0430   04/19/22 2005  Urine Culture  (Septic presentation on arrival (screening labs, nursing and treatment orders for obvious sepsis))  ONCE - URGENT,   URGENT       Question:  Indication  Answer:  Sepsis   04/19/22 2005            Vitals/Pain Today's Vitals   04/20/22 1245 04/20/22 1300 04/20/22 1504 04/20/22 1505  BP: (!) 125/54 (!) 109/46    Pulse: 95 95    Resp: (!) 32 (!) 34    Temp:   (!) 101.7 F (38.7 C)   TempSrc:   Axillary   SpO2: 97% 96%    Weight:      Height:      PainSc:    3     Isolation Precautions No active isolations  Medications Medications  heparin injection 5,000 Units (5,000 Units Subcutaneous Given 04/20/22 0521)  acetaminophen (TYLENOL) tablet 650 mg (650 mg Oral Given 04/20/22 1231)    Or  acetaminophen (TYLENOL) suppository 650 mg ( Rectal See Alternative 04/20/22 1231)  0.9 %  sodium chloride infusion ( Intravenous New Bag/Given 04/20/22 0520)  insulin aspart (novoLOG) injection 0-9 Units ( Subcutaneous Not Given 04/20/22 1221)  vitamin B-12 (CYANOCOBALAMIN) tablet 1,000 mcg (1,000 mcg Oral Given 04/20/22 1047)  escitalopram (LEXAPRO) tablet 20 mg (has no administration in time range)  rosuvastatin (CRESTOR) tablet 5 mg (5 mg Oral Given 04/20/22 1047)  vancomycin (VANCOREADY) IVPB 1500  mg/300 mL (has no administration in time range)  gabapentin (NEURONTIN) capsule 300 mg (300 mg Oral Given 04/20/22 1225)  oxyCODONE (Oxy IR/ROXICODONE) immediate release tablet 5 mg (5 mg Oral Given 04/20/22 1225)  ondansetron (ZOFRAN) injection 4 mg (4 mg Intravenous Given 04/20/22 1225)  cefTRIAXone (ROCEPHIN) 2 g in sodium chloride 0.9 % 100 mL IVPB (has no administration in time range)  ondansetron (ZOFRAN-ODT) disintegrating tablet 4 mg (4 mg Oral Given 04/19/22 1735)  lactated ringers bolus 1,000 mL (0 mLs Intravenous Stopped 04/19/22 2323)    And  lactated ringers bolus 800 mL (0 mLs  Intravenous Stopped 04/19/22 2221)  vancomycin (VANCOREADY) IVPB 2000 mg/400 mL (0 mg Intravenous Stopped 04/19/22 2346)  piperacillin-tazobactam (ZOSYN) IVPB 3.375 g (0 g Intravenous Stopped 04/19/22 2145)  fentaNYL (SUBLIMAZE) injection 50 mcg (50 mcg Intravenous Given 04/19/22 2222)  iohexol (OMNIPAQUE) 300 MG/ML solution 75 mL (75 mLs Intravenous Contrast Given 04/19/22 2305)  lactated ringers bolus 1,000 mL (0 mLs Intravenous Stopped 04/20/22 0111)  acetaminophen (TYLENOL) tablet 650 mg (650 mg Oral Given 04/20/22 0226)    Mobility walks with person assist (legs are pretty swollen though, so we have not walked her here in ED) Low fall risk     R Recommendations: See Admitting Provider Note  Report given to: Deniece Ree RN  Additional Notes:

## 2022-04-20 NOTE — ED Notes (Signed)
Provider at bedside

## 2022-04-21 DIAGNOSIS — A419 Sepsis, unspecified organism: Secondary | ICD-10-CM | POA: Diagnosis not present

## 2022-04-21 DIAGNOSIS — B954 Other streptococcus as the cause of diseases classified elsewhere: Secondary | ICD-10-CM

## 2022-04-21 DIAGNOSIS — E1165 Type 2 diabetes mellitus with hyperglycemia: Secondary | ICD-10-CM | POA: Diagnosis not present

## 2022-04-21 DIAGNOSIS — L03115 Cellulitis of right lower limb: Secondary | ICD-10-CM | POA: Diagnosis not present

## 2022-04-21 DIAGNOSIS — E876 Hypokalemia: Secondary | ICD-10-CM

## 2022-04-21 LAB — CBC
HCT: 29.6 % — ABNORMAL LOW (ref 36.0–46.0)
Hemoglobin: 10 g/dL — ABNORMAL LOW (ref 12.0–15.0)
MCH: 33 pg (ref 26.0–34.0)
MCHC: 33.8 g/dL (ref 30.0–36.0)
MCV: 97.7 fL (ref 80.0–100.0)
Platelets: 60 10*3/uL — ABNORMAL LOW (ref 150–400)
RBC: 3.03 MIL/uL — ABNORMAL LOW (ref 3.87–5.11)
RDW: 14.8 % (ref 11.5–15.5)
WBC: 2.6 10*3/uL — ABNORMAL LOW (ref 4.0–10.5)
nRBC: 0 % (ref 0.0–0.2)

## 2022-04-21 LAB — COMPREHENSIVE METABOLIC PANEL
ALT: 22 U/L (ref 0–44)
AST: 35 U/L (ref 15–41)
Albumin: 2.7 g/dL — ABNORMAL LOW (ref 3.5–5.0)
Alkaline Phosphatase: 47 U/L (ref 38–126)
Anion gap: 10 (ref 5–15)
BUN: 25 mg/dL — ABNORMAL HIGH (ref 8–23)
CO2: 22 mmol/L (ref 22–32)
Calcium: 7.7 mg/dL — ABNORMAL LOW (ref 8.9–10.3)
Chloride: 102 mmol/L (ref 98–111)
Creatinine, Ser: 1.11 mg/dL — ABNORMAL HIGH (ref 0.44–1.00)
GFR, Estimated: 52 mL/min — ABNORMAL LOW (ref >60)
Glucose, Bld: 104 mg/dL — ABNORMAL HIGH (ref 70–99)
Potassium: 3.4 mmol/L — ABNORMAL LOW (ref 3.5–5.1)
Sodium: 134 mmol/L — ABNORMAL LOW (ref 135–145)
Total Bilirubin: 1.3 mg/dL — ABNORMAL HIGH (ref 0.3–1.2)
Total Protein: 5.4 g/dL — ABNORMAL LOW (ref 6.5–8.1)

## 2022-04-21 LAB — GLUCOSE, CAPILLARY
Glucose-Capillary: 84 mg/dL (ref 70–99)
Glucose-Capillary: 109 mg/dL — ABNORMAL HIGH (ref 70–99)
Glucose-Capillary: 126 mg/dL — ABNORMAL HIGH (ref 70–99)
Glucose-Capillary: 102 mg/dL — ABNORMAL HIGH (ref 70–99)

## 2022-04-21 MED ORDER — POTASSIUM CHLORIDE CRYS ER 20 MEQ PO TBCR
40.0000 meq | EXTENDED_RELEASE_TABLET | Freq: Once | ORAL | Status: AC
  Administered 2022-04-21: 40 meq via ORAL
  Filled 2022-04-21: qty 2

## 2022-04-21 MED ORDER — MORPHINE SULFATE (PF) 2 MG/ML IV SOLN
1.0000 mg | INTRAVENOUS | Status: DC | PRN
  Administered 2022-04-22 – 2022-04-25 (×10): 1 mg via INTRAVENOUS
  Filled 2022-04-21 (×10): qty 1

## 2022-04-21 MED ORDER — OXYCODONE HCL 5 MG PO TABS
5.0000 mg | ORAL_TABLET | ORAL | Status: DC | PRN
  Administered 2022-04-21 – 2022-04-27 (×19): 5 mg via ORAL
  Filled 2022-04-21 (×20): qty 1

## 2022-04-21 MED ORDER — LINEZOLID 600 MG PO TABS
600.0000 mg | ORAL_TABLET | Freq: Two times a day (BID) | ORAL | Status: AC
  Administered 2022-04-21 – 2022-04-23 (×4): 600 mg via ORAL
  Filled 2022-04-21 (×5): qty 1

## 2022-04-21 MED ORDER — ACETAMINOPHEN 500 MG PO TABS
1000.0000 mg | ORAL_TABLET | Freq: Three times a day (TID) | ORAL | Status: DC
  Administered 2022-04-21 – 2022-04-27 (×16): 1000 mg via ORAL
  Filled 2022-04-21 (×17): qty 2

## 2022-04-21 MED ORDER — GABAPENTIN 300 MG PO CAPS
300.0000 mg | ORAL_CAPSULE | Freq: Three times a day (TID) | ORAL | Status: DC
  Administered 2022-04-21 – 2022-04-27 (×18): 300 mg via ORAL
  Filled 2022-04-21 (×18): qty 1

## 2022-04-21 MED ORDER — LINEZOLID 600 MG PO TABS
600.0000 mg | ORAL_TABLET | Freq: Two times a day (BID) | ORAL | Status: DC
Start: 2022-04-21 — End: 2022-04-21

## 2022-04-21 NOTE — Evaluation (Signed)
Physical Therapy Evaluation Patient Details Name: Kathleen Wiley MRN: [id number] DOB: 1946-02-18 Today's Date: 04/21/2022  History of Present Illness  76 yo female with onset of nausea, vomiting, fever, and hypotension  was admitted 6/12 after admit two weeks ago for RLE cellulitis.  Pt has currently redness and major pain on BLE's, R >L and dx sepsis.  Cleared for deep wound, has ongoing BP drops, LE edema and generalized weakness.  PMHx: DM, HTN, HLD, cellulitis  Clinical Impression  Pt was seen for initial evaluation and management of mobility with care for her very painful RLE.  Pt is attended by husband who will be helpful, but also has DIL expecting to help care for her again.  Pt is demonstrating good awareness with practice of how to initiate standing and control her balance.  Follow along with her to increase control of her standing endurance, LE strength and to work on safety and quality of gait within limits of her pain.  Acute PT goals for therapy are on POC below.       Recommendations for follow up therapy are one component of a multi-disciplinary discharge planning process, led by the attending physician.  Recommendations may be updated based on patient status, additional functional criteria and insurance authorization.  Follow Up Recommendations Home health PT    Assistance Recommended at Discharge Intermittent Supervision/Assistance  Patient can return home with the following  A little help with walking and/or transfers;A little help with bathing/dressing/bathroom;Assistance with cooking/housework;Assist for transportation;Help with stairs or ramp for entrance    Equipment Recommendations Rolling walker (2 wheels)  Recommendations for Other Services       Functional Status Assessment Patient has had a recent decline in their functional status and demonstrates the ability to make significant improvements in function in a reasonable and predictable amount of time.      Precautions / Restrictions Precautions Precautions: Fall Precaution Comments: monitor BP Restrictions Weight Bearing Restrictions: No      Mobility  Bed Mobility Overal bed mobility: Needs Assistance Bed Mobility: Supine to Sit, Sit to Supine     Supine to sit: Mod assist, HOB elevated Sit to supine: Min assist   General bed mobility comments: mod to give heavy support to trunk to lift off the bed    Transfers Overall transfer level: Needs assistance Equipment used: Rolling walker (2 wheels), 1 person hand held assist Transfers: Sit to/from Stand, Bed to chair/wheelchair/BSC Sit to Stand: Mod assist          Lateral/Scoot Transfers: Min assist, From elevated surface General transfer comment: mod to stand with pt being inconsistent in using LE's, likely due to at least unconscious concern about pain    Ambulation/Gait Ambulation/Gait assistance: Min assist Gait Distance (Feet): 12 Feet Assistive device: Rolling walker (2 wheels), 1 person hand held assist Gait Pattern/deviations: Step-to pattern, Decreased stride length, Wide base of support, Trunk flexed Gait velocity: reduced Gait velocity interpretation: <1.31 ft/sec, indicative of household ambulator Pre-gait activities: standing balance ck General Gait Details: sidestepping on side of bed with help to maneuver walker, monitoring O2 sats with drops to 88% initially moving and then down no lower than 93%  Stairs            Wheelchair Mobility    Modified Rankin (Stroke Patients Only)       Balance Overall balance assessment: Needs assistance Sitting-balance support: Feet supported Sitting balance-Leahy Scale: Fair   Postural control: Posterior lean Standing balance support: Bilateral upper extremity supported, During  functional activity Standing balance-Leahy Scale: Poor                               Pertinent Vitals/Pain Pain Assessment Pain Assessment: Faces Faces Pain Scale:  Hurts even more Pain Location: RLE especially Pain Descriptors / Indicators: Grimacing, Guarding Pain Intervention(s): Limited activity within patient's tolerance, Monitored during session, Repositioned    Home Living Family/patient expects to be discharged to:: Private residence Living Arrangements: Spouse/significant other Available Help at Discharge: Family;Available 24 hours/day Type of Home: House Home Access: Stairs to enter Entrance Stairs-Rails: Can reach both Entrance Stairs-Number of Steps: 2 Alternate Level Stairs-Number of Steps: flight Home Layout: Two level;Laundry or work area in basement;Able to live on main level with bedroom/bathroom Home Equipment: Radio producer - single point;Grab bars - tub/shower      Prior Function Prior Level of Function : Independent/Modified Independent;Driving             Mobility Comments: used SPC to go out of the house only ADLs Comments: independent for all     Hand Dominance   Dominant Hand: Right    Extremity/Trunk Assessment   Upper Extremity Assessment Upper Extremity Assessment: Overall WFL for tasks assessed    Lower Extremity Assessment Lower Extremity Assessment: RLE deficits/detail RLE Deficits / Details: deep redness with edema, painful to DF ankle RLE: Unable to fully assess due to pain RLE Coordination: decreased gross motor    Cervical / Trunk Assessment Cervical / Trunk Assessment: Normal  Communication   Communication: No difficulties  Cognition Arousal/Alertness: Awake/alert Behavior During Therapy: WFL for tasks assessed/performed Overall Cognitive Status: Within Functional Limits for tasks assessed                                 General Comments: describes 4-5 pain after meds        General Comments General comments (skin integrity, edema, etc.): pt was assisted to stand and take steps in the room, initially to [facility] and then up side of bed    Exercises     Assessment/Plan    PT  Assessment Patient needs continued PT services  PT Problem List Decreased strength;Decreased range of motion;Decreased activity tolerance;Decreased balance;Decreased mobility;Decreased coordination;Decreased knowledge of use of DME;Cardiopulmonary status limiting activity;Pain;Decreased skin integrity       PT Treatment Interventions DME instruction;Gait training;Stair training;Functional mobility training;Therapeutic activities;Therapeutic exercise;Balance training;Neuromuscular re-education;Patient/family education    PT Goals (Current goals can be found in the Care Plan section)  Acute Rehab PT Goals Patient Stated Goal: to get home and stay with DIL PT Goal Formulation: With patient Time For Goal Achievement: 04/28/22 Potential to Achieve Goals: Good    Frequency Min 3X/week     Co-evaluation               AM-PAC PT "6 Clicks" Mobility  Outcome Measure Help needed turning from your back to your side while in a flat bed without using bedrails?: None Help needed moving from lying on your back to sitting on the side of a flat bed without using bedrails?: A Lot Help needed moving to and from a bed to a chair (including a wheelchair)?: A Lot Help needed standing up from a chair using your arms (e.g., wheelchair or bedside chair)?: A Lot Help needed to walk in hospital room?: A Little Help needed climbing 3-5 steps with a railing? : Total 6  Click Score: 14    End of Session Equipment Utilized During Treatment: Gait belt;Oxygen Activity Tolerance: Patient limited by fatigue;Treatment limited secondary to medical complications (Comment) Patient left: in bed;with call bell/phone within reach;with bed alarm set;with family/visitor present Nurse Communication: Mobility status PT Visit Diagnosis: Unsteadiness on feet (R26.81);Muscle weakness (generalized) (M62.81);Pain Pain - Right/Left: Right Pain - part of body: Leg    Time: 1610-9604 PT Time Calculation (min) (ACUTE ONLY):  30 min   Charges:   PT Evaluation $PT Eval Moderate Complexity: 1 Mod PT Treatments $Therapeutic Activity: 8-22 mins       Ramond Dial 04/21/2022, 4:18 PM  Mee Hives, PT PhD Acute Rehab Dept. Number: [place] [facility] [place]

## 2022-04-21 NOTE — Progress Notes (Addendum)
PROGRESS NOTE        PATIENT DETAILS Name: RONEISHA STERN Age: 76 y.o. Sex: female Date of Birth: 1946/07/09 Admit Date: 04/19/2022 Admitting Physician Evalee Mutton Kristeen Mans, MD [id number], Ivin Booty, MD  Brief Summary: Patient is a 76 y.o.  female with history of DM-2, HTN, HLD who presented with fever, vomiting, and RLE erythema swelling-found to have cellulitis and subsequently admitted to the hospitalist service.  See below for further details.   Significant events: 6/12>> admit to [facility] for RLE swelling/cellulitis.  Significant studies: 6/12>> CT  right tibia/fibula: Diffuse subcutaneous edema/swelling-no evidence of soft tissue gas. 6/12>> CXR: No PNA 6/13>> CT head: No acute abnormalities 6/13>>RLE doppler-neg for DVT  Significant microbiology data: 6/12>> COVID/influenza PCR: Negative 6/12>> blood culture: 1/2-gram-positive cocci in chains  Procedures: None  Consults: None  Subjective: Continues to have pain in the RLE.  Improvement in erythema in the left leg-some mild improvement in erythema in the right leg as well.  Objective: Vitals: Blood pressure (!) 107/48, pulse 92, temperature 98.8 F (37.1 C), temperature source Oral, resp. rate 20, height '5\' 4"'$  (1.626 m), weight 106.6 kg, SpO2 98 %.   Exam: Gen Exam:Alert awake-not in any distress HEENT:atraumatic, normocephalic Chest: B/L clear to auscultation anteriorly CVS:S1S2 regular Abdomen:soft non tender, non distended Extremities: RLE picture below-erythema and LLE has resolved. Neurology: Non focal Skin: no rash       Pertinent Labs/Radiology:    Latest Ref Rng & Units 04/21/2022   12:56 AM 04/20/2022    1:50 AM 04/19/2022    5:30 PM  CBC  WBC 4.0 - 10.5 K/uL 2.6  4.3  4.1   Hemoglobin 12.0 - 15.0 g/dL 10.0  12.4  13.4   Hematocrit 36.0 - 46.0 % 29.6  37.8  40.4   Platelets 150 - 400 K/uL 60  78  PLATELET CLUMPS NOTED ON SMEAR, UNABLE TO ESTIMATE     Lab Results   Component Value Date   NA 134 (L) 04/21/2022   K 3.4 (L) 04/21/2022   CL 102 04/21/2022   CO2 22 04/21/2022      Assessment/Plan: Sepsis due to RLE cellulitis: Sepsis physiology better-BP now more stable-afebrile overnight-Per continues to have leukopenia-1 out of 2 blood cultures positive for gram-positive cocci in chains-high suspicion for Streptococcus per BCID.  On vancomycin/Rocephin-continue to elevate RLE-and follow clinical course.  If no improvement-imaging may required over the next few days.  However exam is not suggestive of deep tissue infection at this point.  We will switch Neurontin to 3 times daily dosing given ongoing pain-continue to use as needed narcotics.  Addendum: Blood cultures positive for Streptococcus group G-repeat cultures-check echo-ID consulted.  Will await further recommendations.  Acute metabolic encephalopathy: Resolved-likely due to sepsis.  She continues to be completely awake and alert.  AKI: Improved-likely hemodynamically mediated.  Follow periodically.  Hypokalemia: Replete and recheck.  Pancytopenia: Suspect due to sepsis/infection-continue to follow closely-we will order anemia panel including reticulocyte count/B12 levels with a.m. labs  DM-2 (A1c 5.2 on 6/30): Continue SSI-resume Ozempic on discharge.  Recent Labs    04/20/22 1651 04/20/22 2211 04/21/22 0749  GLUCAP 115* 121* 84     HTN: Apparently not on any antihypertensives at home-BP was soft yesterday-today more stable-watch closely.  HLD: Continue statin  Depression/anxiety: Resume Lexapro  Morbid Obesity Estimated body mass index is 40.34 kg/m  as calculated from the following:   Height as of this encounter: '5\' 4"'$  (1.626 m).   Weight as of this encounter: 106.6 kg.   Code status:   Code Status: Full Code   DVT Prophylaxis: heparin injection 5,000 Units Start: 04/20/22 0600   Family Communication: None at bedside-patient prefers to update family  herself.   Disposition Plan: Status is: Inpatient Remains inpatient appropriate because: Sepsis physiology due to RLE cellulitis-noted stable for discharge remains on IV antibiotics.   Planned Discharge Destination:Home   Diet: Diet Order             Diet heart healthy/carb modified Room service appropriate? Yes; Fluid consistency: Thin  Diet effective now                     Antimicrobial agents: Anti-infectives (From admission, onward)    Start     Dose/Rate Route Frequency Ordered Stop   04/21/22 2130  vancomycin (VANCOREADY) IVPB 1500 mg/300 mL        1,500 mg 150 mL/hr over 120 Minutes Intravenous Every 48 hours 04/20/22 0952     04/20/22 2200  vancomycin (VANCOCIN) IVPB 1000 mg/200 mL premix  Status:  Discontinued        1,000 mg 200 mL/hr over 60 Minutes Intravenous Every 24 hours 04/19/22 2204 04/20/22 0951   04/20/22 2200  cefTRIAXone (ROCEPHIN) 2 g in sodium chloride 0.9 % 100 mL IVPB        2 g 200 mL/hr over 30 Minutes Intravenous Every 24 hours 04/20/22 1401     04/20/22 0430  piperacillin-tazobactam (ZOSYN) IVPB 3.375 g  Status:  Discontinued       See Hyperspace for full Linked Orders Report.   3.375 g 12.5 mL/hr over 240 Minutes Intravenous Every 8 hours 04/19/22 2027 04/20/22 1401   04/19/22 2030  vancomycin (VANCOREADY) IVPB 2000 mg/400 mL        2,000 mg 200 mL/hr over 120 Minutes Intravenous  Once 04/19/22 2027 04/19/22 2346   04/19/22 2030  piperacillin-tazobactam (ZOSYN) IVPB 3.375 g       See Hyperspace for full Linked Orders Report.   3.375 g 100 mL/hr over 30 Minutes Intravenous  Once 04/19/22 2027 04/19/22 2145   04/19/22 2015  vancomycin (VANCOCIN) IVPB 1000 mg/200 mL premix  Status:  Discontinued        1,000 mg 200 mL/hr over 60 Minutes Intravenous  Once 04/19/22 2005 04/19/22 2027        MEDICATIONS: Scheduled Meds:  escitalopram  20 mg Oral Daily   gabapentin  300 mg Oral BID   heparin  5,000 Units Subcutaneous Q8H   insulin  aspart  0-9 Units Subcutaneous TID WC   rosuvastatin  5 mg Oral Daily   cyanocobalamin  1,000 mcg Oral Daily   Continuous Infusions:  cefTRIAXone (ROCEPHIN)  IV 2 g (04/20/22 2200)   vancomycin     PRN Meds:.acetaminophen **OR** acetaminophen, ondansetron (ZOFRAN) IV, oxyCODONE   I have personally reviewed following labs and imaging studies  LABORATORY DATA: CBC: Recent Labs  Lab 04/19/22 1730 04/20/22 0150 04/21/22 0056  WBC 4.1 4.3 2.6*  NEUTROABS 3.4 3.6  --   HGB 13.4 12.4 10.0*  HCT 40.4 37.8 29.6*  MCV 100.7* 103.0* 97.7  PLT PLATELET CLUMPS NOTED ON SMEAR, UNABLE TO ESTIMATE 78* 60*     Basic Metabolic Panel: Recent Labs  Lab 04/19/22 1730 04/20/22 0150 04/21/22 0056  NA 136 135 134*  K 3.7 3.8 3.4*  CL 102 99 102  CO2 '23 23 22  '$ GLUCOSE 177* 135* 104*  BUN 18 22 25*  CREATININE 1.05* 1.40* 1.11*  CALCIUM 9.3 8.8* 7.7*     GFR: Estimated Creatinine Clearance: 53 mL/min (A) (by C-G formula based on SCr of 1.11 mg/dL (H)).  Liver Function Tests: Recent Labs  Lab 04/19/22 1730 04/20/22 0150 04/21/22 0056  AST 36 33 35  ALT '29 26 22  '$ ALKPHOS 77 62 47  BILITOT 1.9* 2.0* 1.3*  PROT 7.3 6.3* 5.4*  ALBUMIN 4.0 3.4* 2.7*    Recent Labs  Lab 04/19/22 1730  LIPASE 32    No results for input(s): "AMMONIA" in the last 168 hours.  Coagulation Profile: Recent Labs  Lab 04/19/22 2033  INR 1.2     Cardiac Enzymes: No results for input(s): "CKTOTAL", "CKMB", "CKMBINDEX", "TROPONINI" in the last 168 hours.  BNP (last 3 results) No results for input(s): "PROBNP" in the last 8760 hours.  Lipid Profile: No results for input(s): "CHOL", "HDL", "LDLCALC", "TRIG", "CHOLHDL", "LDLDIRECT" in the last 72 hours.  Thyroid Function Tests: No results for input(s): "TSH", "T4TOTAL", "FREET4", "T3FREE", "THYROIDAB" in the last 72 hours.  Anemia Panel: No results for input(s): "VITAMINB12", "FOLATE", "FERRITIN", "TIBC", "IRON", "RETICCTPCT" in the last  72 hours.  Urine analysis:    Component Value Date/Time   COLORURINE AMBER (A) 04/19/2022 2123   APPEARANCEUR HAZY (A) 04/19/2022 2123   LABSPEC 1.013 04/19/2022 2123   PHURINE 5.0 04/19/2022 2123   GLUCOSEU NEGATIVE 04/19/2022 2123   HGBUR NEGATIVE 04/19/2022 2123   BILIRUBINUR NEGATIVE 04/19/2022 2123   KETONESUR NEGATIVE 04/19/2022 2123   PROTEINUR NEGATIVE 04/19/2022 2123   UROBILINOGEN 1.0 08/06/2012 2046   NITRITE NEGATIVE 04/19/2022 2123   LEUKOCYTESUR NEGATIVE 04/19/2022 2123    Sepsis Labs: Lactic Acid, Venous    Component Value Date/Time   LATICACIDVEN 3.8 ([place]) 04/20/2022 0605    MICROBIOLOGY: Recent Results (from the past 240 hour(s))  Resp Panel by RT-PCR (Flu A&B, Covid) Anterior Nasal Swab     Status: None   Collection Time: 04/19/22  8:05 PM   Specimen: Anterior Nasal Swab  Result Value Ref Range Status   SARS Coronavirus 2 by RT PCR NEGATIVE NEGATIVE Final    Comment: (NOTE) SARS-CoV-2 target nucleic acids are NOT DETECTED.  The SARS-CoV-2 RNA is generally detectable in upper respiratory specimens during the acute phase of infection. The lowest concentration of SARS-CoV-2 viral copies this assay can detect is 138 copies/mL. A negative result does not preclude SARS-Cov-2 infection and should not be used as the sole basis for treatment or other patient management decisions. A negative result may occur with  improper specimen collection/handling, submission of specimen other than nasopharyngeal swab, presence of viral mutation(s) within the areas targeted by this assay, and inadequate number of viral copies(<138 copies/mL). A negative result must be combined with clinical observations, patient history, and epidemiological information. The expected result is Negative.  Fact Sheet for Patients:  EntrepreneurPulse.com.au  Fact Sheet for Healthcare Providers:  IncredibleEmployment.be  This test is no t yet approved or  cleared by the [place] FDA and  has been authorized for detection and/or diagnosis of SARS-CoV-2 by FDA under an Emergency Use Authorization (EUA). This EUA will remain  in effect (meaning this test can be used) for the duration of the COVID-19 declaration under Section 564(b)(1) of the Act, 21 U.S.C.section 360bbb-3(b)(1), unless the authorization is terminated  or revoked sooner.       Influenza A  by PCR NEGATIVE NEGATIVE Final   Influenza B by PCR NEGATIVE NEGATIVE Final    Comment: (NOTE) The Xpert Xpress SARS-CoV-2/FLU/RSV plus assay is intended as an aid in the diagnosis of influenza from Nasopharyngeal swab specimens and should not be used as a sole basis for treatment. Nasal washings and aspirates are unacceptable for Xpert Xpress SARS-CoV-2/FLU/RSV testing.  Fact Sheet for Patients: EntrepreneurPulse.com.au  Fact Sheet for Healthcare Providers: IncredibleEmployment.be  This test is not yet approved or cleared by the [place] FDA and has been authorized for detection and/or diagnosis of SARS-CoV-2 by FDA under an Emergency Use Authorization (EUA). This EUA will remain in effect (meaning this test can be used) for the duration of the COVID-19 declaration under Section 564(b)(1) of the Act, 21 U.S.C. section 360bbb-3(b)(1), unless the authorization is terminated or revoked.  Performed at [facility], [place] [address] [place], [place] [address]   Blood Culture (routine x 2)     Status: None (Preliminary result)   Collection Time: 04/19/22  8:24 PM   Specimen: BLOOD  Result Value Ref Range Status   Specimen Description BLOOD RIGHT ANTECUBITAL  Final   Special Requests   Final    BOTTLES DRAWN AEROBIC AND ANAEROBIC Blood Culture adequate volume   Culture   Final    NO GROWTH 2 DAYS Performed at [facility], [place] [address], [place], [place] [address]    Report Status PENDING  Incomplete  Blood Culture  (routine x 2)     Status: None (Preliminary result)   Collection Time: 04/19/22  8:33 PM   Specimen: BLOOD  Result Value Ref Range Status   Specimen Description BLOOD RIGHT ANTECUBITAL  Final   Special Requests   Final    BOTTLES DRAWN AEROBIC AND ANAEROBIC Blood Culture adequate volume   Culture  Setup Time   Final    GRAM POSITIVE COCCI IN CHAINS IN BOTH AEROBIC AND ANAEROBIC BOTTLES CRITICAL RESULT CALLED TO, READ BACK BY AND VERIFIED WITH:  C/ ELIZABETH M., PHARMD 04/20/22 1254 A. LAFRANCE Performed at [facility], [place] [address], [place], [place] [address]    Culture GRAM POSITIVE COCCI  Final   Report Status PENDING  Incomplete  Blood Culture ID Panel (Reflexed)     Status: Abnormal   Collection Time: 04/19/22  8:33 PM  Result Value Ref Range Status   Enterococcus faecalis NOT DETECTED NOT DETECTED Final   Enterococcus Faecium NOT DETECTED NOT DETECTED Final   Listeria monocytogenes NOT DETECTED NOT DETECTED Final   Staphylococcus species NOT DETECTED NOT DETECTED Final   Staphylococcus aureus (BCID) NOT DETECTED NOT DETECTED Final   Staphylococcus epidermidis NOT DETECTED NOT DETECTED Final   Staphylococcus lugdunensis NOT DETECTED NOT DETECTED Final   Streptococcus species DETECTED (A) NOT DETECTED Final    Comment: Not Enterococcus species, Streptococcus agalactiae, Streptococcus pyogenes, or Streptococcus pneumoniae. CRITICAL RESULT CALLED TO, READ BACK BY AND VERIFIED WITH:  C/ ELIZABETH M., PHARMD 04/20/22 1254 A. LAFRANCE    Streptococcus agalactiae NOT DETECTED NOT DETECTED Final   Streptococcus pneumoniae NOT DETECTED NOT DETECTED Final   Streptococcus pyogenes NOT DETECTED NOT DETECTED Final   A.calcoaceticus-baumannii NOT DETECTED NOT DETECTED Final   Bacteroides fragilis NOT DETECTED NOT DETECTED Final   Enterobacterales NOT DETECTED NOT DETECTED Final   Enterobacter cloacae complex NOT DETECTED NOT DETECTED Final   Escherichia coli NOT DETECTED NOT  DETECTED Final   Klebsiella aerogenes NOT DETECTED NOT DETECTED Final   Klebsiella  oxytoca NOT DETECTED NOT DETECTED Final   Klebsiella pneumoniae NOT DETECTED NOT DETECTED Final   Proteus species NOT DETECTED NOT DETECTED Final   Salmonella species NOT DETECTED NOT DETECTED Final   Serratia marcescens NOT DETECTED NOT DETECTED Final   Haemophilus influenzae NOT DETECTED NOT DETECTED Final   Neisseria meningitidis NOT DETECTED NOT DETECTED Final   Pseudomonas aeruginosa NOT DETECTED NOT DETECTED Final   Stenotrophomonas maltophilia NOT DETECTED NOT DETECTED Final   Candida albicans NOT DETECTED NOT DETECTED Final   Candida auris NOT DETECTED NOT DETECTED Final   Candida glabrata NOT DETECTED NOT DETECTED Final   Candida krusei NOT DETECTED NOT DETECTED Final   Candida parapsilosis NOT DETECTED NOT DETECTED Final   Candida tropicalis NOT DETECTED NOT DETECTED Final   Cryptococcus neoformans/gattii NOT DETECTED NOT DETECTED Final    Comment: Performed at [facility], [place] [address] [place], [place] [address]    RADIOLOGY STUDIES/RESULTS: VAS [place] LOWER EXTREMITY VENOUS (DVT)  Result Date: 04/20/2022  Lower Venous DVT Study Patient Name:  ELFRIEDE BONINI  Date of Exam:   04/20/2022 Medical Rec #: [id number]        Accession #:    [id number] Date of Birth: 1945-11-10        Patient Gender: F Patient Age:   89 years Exam Location:  [facility] Procedure:      VAS [place] LOWER EXTREMITY VENOUS (DVT) Referring Phys: Oren Binet --------------------------------------------------------------------------------  Indications: Edema, Erythema, and cellulitis.  Limitations: Poor ultrasound/tissue interface and body habitus. Comparison Study: 08/11/2016- negative bilateral lower extremity venous duplex Performing Technologist: Maudry Mayhew MHA, RDMS, RVT, RDCS  Examination Guidelines: A complete evaluation includes B-mode imaging, spectral Doppler, color Doppler, and power Doppler as needed  of all accessible portions of each vessel. Bilateral testing is considered an integral part of a complete examination. Limited examinations for reoccurring indications may be performed as noted. The reflux portion of the exam is performed with the patient in reverse Trendelenburg.  +---------+---------------+---------+-----------+----------+--------------+ RIGHT    CompressibilityPhasicitySpontaneityPropertiesThrombus Aging +---------+---------------+---------+-----------+----------+--------------+ CFV      Full           Yes      Yes                                 +---------+---------------+---------+-----------+----------+--------------+ SFJ      Full                                                        +---------+---------------+---------+-----------+----------+--------------+ FV Prox  Full                                                        +---------+---------------+---------+-----------+----------+--------------+ FV Mid   Full                                                        +---------+---------------+---------+-----------+----------+--------------+ FV DistalFull                                                        +---------+---------------+---------+-----------+----------+--------------+  PFV      Full                                                        +---------+---------------+---------+-----------+----------+--------------+ POP      Full           Yes      Yes                                 +---------+---------------+---------+-----------+----------+--------------+   Right Technical Findings: Not visualized segments include Inadequate visualization posterior tibial and peroneal veins.  +----+---------------+---------+-----------+----------+--------------+ LEFTCompressibilityPhasicitySpontaneityPropertiesThrombus Aging +----+---------------+---------+-----------+----------+--------------+ CFV Full           Yes      Yes                                  +----+---------------+---------+-----------+----------+--------------+     Summary: RIGHT: - There is no evidence of deep vein thrombosis in the lower extremity. However, portions of this examination were limited- see technologist comments above.  - No cystic structure found in the popliteal fossa.  LEFT: - No evidence of common femoral vein obstruction.  *See table(s) above for measurements and observations. Electronically signed by Deitra Mayo MD on 04/20/2022 at 3:13:07 PM.    Final    CT HEAD WO CONTRAST (5MM)  Result Date: 04/20/2022 CLINICAL DATA:  76 year old female altered mental status. EXAM: CT HEAD WITHOUT CONTRAST TECHNIQUE: Contiguous axial images were obtained from the base of the skull through the vertex without intravenous contrast. RADIATION DOSE REDUCTION: This exam was performed according to the departmental dose-optimization program which includes automated exposure control, adjustment of the mA and/or kV according to patient size and/or use of iterative reconstruction technique. COMPARISON:  None Available. FINDINGS: Brain: Cerebral volume is within normal limits for age. No midline shift, ventriculomegaly, mass effect, evidence of mass lesion, intracranial hemorrhage or evidence of cortically based acute infarction. Minimal to mild for age subcortical white matter hypodensity. No cortical encephalomalacia and otherwise normal gray-white differentiation. Vascular: Calcified atherosclerosis at the skull base. No suspicious intracranial vascular hyperdensity. Skull: No acute osseous abnormality identified. Anterior C1-C2 degeneration. Sinuses/Orbits: Right maxillary sinus mucoperiosteal thickening with superimposed fluid level. But the other paranasal sinuses are well aerated. Bilateral tympanic cavities and mastoids appear clear. Other: Postoperative changes to both globes. No acute orbit or scalp soft tissue finding. Negative visible noncontrast deep  soft tissue spaces of the face. IMPRESSION: 1. No acute intracranial abnormality. Negative for age noncontrast CT appearance of the brain. 2. 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 Electronically Signed   By: Genevie Ann M.D.   On: 04/20/2022 06:53   CT TIBIA FIBULA RIGHT W CONTRAST  Result Date: 04/19/2022 CLINICAL DATA:  Soft tissue infection. EXAM: CT OF THE LOWER RIGHT EXTREMITY WITH CONTRAST TECHNIQUE: Multidetector CT imaging of the lower right extremity was performed according to the standard protocol following intravenous contrast administration. RADIATION DOSE REDUCTION: This exam was performed according to the departmental dose-optimization program which includes automated exposure control, adjustment of the mA and/or kV according to patient size and/or use of iterative reconstruction technique. CONTRAST:  30m OMNIPAQUE IOHEXOL 300 MG/ML  SOLN COMPARISON:  X-ray same day FINDINGS: Bones/Joint/Cartilage There is  no evidence for fracture or dislocation. Total knee arthroplasty appears in anatomic alignment without evidence for hardware loosening. No obvious joint effusions are seen. Ligaments Suboptimally assessed by CT. Muscles and Tendons Deep fascial planes appear preserved. Muscles and tendons are grossly within normal limits. No enhancing fluid collections. Soft tissues There is diffuse subcutaneous swelling and edema throughout the mid and distal lower extremity. There is no evidence for soft tissue gas or enhancing fluid collection. There are some nonspecific scattered subcutaneous calcifications. IMPRESSION: 1. Diffuse subcutaneous edema and swelling of the mid and distal lower extremity. Findings can be seen in the setting of cellulitis. No evidence for soft tissue gas, abscess, or deep fascial plane involvement. 2. No acute bony abnormality. 3. Knee arthroplasty appears uncomplicated. Electronically Signed   By: Ronney Asters M.D.   On: 04/19/2022 23:53   DG Tibia/Fibula Right  Port  Result Date: 04/19/2022 CLINICAL DATA:  Fever, body aches EXAM: PORTABLE RIGHT TIBIA AND FIBULA - 2 VIEW COMPARISON:  None Available. FINDINGS: Changes of right knee replacement partially visualized. No acute bony abnormality. Specifically, no fracture, subluxation, or dislocation. Soft tissues are intact. IMPRESSION: No acute bony abnormality. Electronically Signed   By: Rolm Baptise M.D.   On: 04/19/2022 21:09   DG Chest Port 1 View  Result Date: 04/19/2022 CLINICAL DATA:  Questionable sepsis - evaluate for abnormality EXAM: PORTABLE CHEST 1 VIEW COMPARISON:  11/17/2021 FINDINGS: Cardiomegaly. No confluent airspace opacities, effusions or edema. Mediastinal contours within normal limits. No acute bony abnormality. IMPRESSION: Cardiomegaly.  No active disease. Electronically Signed   By: Rolm Baptise M.D.   On: 04/19/2022 21:08     LOS: 1 day   Oren Binet, MD  [facility]    To contact the attending provider between 7A-7P or the covering provider during after hours 7P-7A, please log into the web site [email or web address] and access using universal [place] password for that web site. If you do not have the password, please call the hospital operator.  04/21/2022, 10:23 AM

## 2022-04-21 NOTE — Care Management (Signed)
  Transition of Care [facility]) Screening Note   Patient Details  Name: Kathleen Wiley Date of Birth: 02-12-1946   Transition of Care [facility]) CM/SW Contact:    Carles Collet, RN Phone Number: 04/21/2022, 7:43 AM    Transition of Care Department [facility]) has reviewed patient and no TOC needs have been identified at this time. We will continue to monitor patient advancement through interdisciplinary progression rounds. If new patient transition needs arise, please place a TOC consult.

## 2022-04-21 NOTE — Consult Note (Signed)
[facility] for Infectious Disease    Date of Admission:  04/19/2022     Total days of antibiotics 1   Ceftriaxone   Vancomycin   Zosyn               Reason for Consult: group g strep bacteremia / cellulitis     Referring Provider: Ghimire Primary Care Provider: Jonathon Jordan, MD    Assessment: Kathleen Wiley is a 76 y.o. female with T2DM, HLD, HTN admitted to hospital for management of severe RLE cellulitis that seemed to rebound since an episode about 3 weeks prior. She has a history of cellulitis first of the right thumb that required surgery about 6 years ago; then one episode of the LLE a few years later. She was recently treated with good response with 7d course of QID cephalexin May 18th. Recurrent cellulitis of the same leg (feels like it did not really resolve 100%) with secondary bacteremia due to group g streptococcus in 1/2 sites. Clinically she is feeling better and stable hemodynamics. CT imaging did not reveal any abscess or necrotizing component. On exam she is still quite warm, swollen and tender. Does seem a little less red compared to initial pictures. Will narrow to Ceftriaxone monotherapy for now. Will add linezolid for eagle effect to help with toxin inhibition given exam and ongoing temperatures.   TTE is pending to work up group g strep bacteremia. Not a high-risk offender for endocarditis.   Leukopenia - wbc drop 4.3 >> 2.6K. will follow tomorrow's trend. CBC with Diff in AM.     Plan: Continue ceftriaxone Stop vancomycin Add linezolid x 48h  Follow TTE    Principal Problem:   Group G streptococcal infection Active Problems:   Essential hypertension, benign   Type 2 diabetes mellitus with hyperglycemia (HCC)   Sepsis (HCC)   Cellulitis of right lower extremity   Cellulitis    acetaminophen  1,000 mg Oral Q8H   escitalopram  20 mg Oral Daily   gabapentin  300 mg Oral TID   heparin  5,000 Units Subcutaneous Q8H   insulin aspart   0-9 Units Subcutaneous TID WC   rosuvastatin  5 mg Oral Daily   cyanocobalamin  1,000 mcg Oral Daily    HPI: Kathleen Wiley is a 76 y.o. female admitted from home on 6/12 for RLE swelling/cellulitis.   PMHx DMT2, HLD, HTN.   Allergic to Trimethoprim Sulfamethoxazole (blisters)   States that she has had cellulitis before, most recently diagnosed with it about a month ago per chart record. Received 1 week of QID Cephalexin at that time on 5/18. She did report improvement then with treatment. A few days leading up to this admission she had worsening pain, redness and swelling that was pretty abrupt in onset. She was much sicker this time also with nausea, vomiting and high fevers.   CT scan negative for necrotizing infection, abscess and clinically stable throughout the admission. 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 Also bilateral hip replacements also functioning well.   Blood cultures growing out Group G Streptococcus in 1/2 sets. Started on vancomycin + zosyn initially and now on Ceftriaxone. She had high fevers to 52 F when she presented. Normal hemodynamics. No leukocytosis.  Repeat blood cultures have been ordered as well as a TTE. She has no chest pain, shortness of breath/cough (though she is on 1 LPM  O2 via nasal cannula to maintain oxygen > 90%). No home O2.  She does feel better than when she came in but still tender / painful leg.   Review of Systems: Review of Systems  Constitutional:  Positive for chills, fever and malaise/fatigue.  Respiratory:  Negative for cough and shortness of breath.   Cardiovascular:  Negative for chest pain.  Gastrointestinal:  Positive for nausea and vomiting.  Genitourinary: Negative.   Musculoskeletal:        RLE pain a/w cellulitis   Neurological:  Negative for dizziness.    Past Medical History:  Diagnosis Date   Anxiety    Arthritis    knee, hip   Cellulitis 08/2016    left leg   Complication of anesthesia    Diabetes mellitus without complication (HCC)    GERD (gastroesophageal reflux disease)    Heart murmur    "slight "  informed years ago.-    Hernia, umbilical    History of atrial fibrillation 2007   with flagyl   History of endometriosis    Hypercholesteremia    Hypertension    has not been to a cardiologist   PONV (postoperative nausea and vomiting)    Rectal vaginal fistula    Refusal of blood transfusions as patient is Jehovah's Witness    Sleep apnea     Social History   Tobacco Use   Smoking status: Former    Packs/day: 1.00    Years: 10.00    Total pack years: 10.00    Types: Cigarettes    Quit date: 11/08/1981    Years since quitting: 40.4   Smokeless tobacco: Never  Vaping Use   Vaping Use: Never used  Substance Use Topics   Alcohol use: No   Drug use: No    Family History  Problem Relation Age of Onset   Dementia Mother    Cancer Father    Breast cancer Neg Hx    Allergies  Allergen Reactions   Other     Refuses whole blood but does accept albumin   Atorvastatin Other (See Comments)   Metronidazole Other (See Comments)    Chest pain, A-fib    Septra [Sulfamethoxazole-Trimethoprim] Other (See Comments)    Blisters     OBJECTIVE: Blood pressure (!) 110/55, pulse 92, temperature 100.2 F (37.9 C), temperature source Oral, resp. rate 20, height '5\' 4"'$  (1.626 m), weight 106.6 kg, SpO2 94 %.  Physical Exam Vitals reviewed.  Constitutional:      Appearance: Normal appearance.     Comments: Resting in bed comfortably. Pleasant and in no distress.   HENT:     Mouth/Throat:     Mouth: Mucous membranes are moist.     Pharynx: Oropharynx is clear.  Eyes:     Pupils: Pupils are equal, round, and reactive to light.  Cardiovascular:     Rate and Rhythm: Normal rate and regular rhythm.  Pulmonary:     Effort: Pulmonary effort is normal.  Musculoskeletal:        General: Swelling and tenderness present.      Right lower leg: Edema present.  Skin:    General: Skin is warm and dry.     Capillary Refill: Capillary refill takes less than 2 seconds.  Neurological:     Mental Status: She is alert and oriented to person, place, and time.     Lab Results Lab Results  Component Value Date   WBC 2.6 (L) 04/21/2022   HGB 10.0 (L)  04/21/2022   HCT 29.6 (L) 04/21/2022   MCV 97.7 04/21/2022   PLT 60 (L) 04/21/2022    Lab Results  Component Value Date   CREATININE 1.11 (H) 04/21/2022   BUN 25 (H) 04/21/2022   NA 134 (L) 04/21/2022   K 3.4 (L) 04/21/2022   CL 102 04/21/2022   CO2 22 04/21/2022    Lab Results  Component Value Date   ALT 22 04/21/2022   AST 35 04/21/2022   ALKPHOS 47 04/21/2022   BILITOT 1.3 (H) 04/21/2022     Microbiology: Recent Results (from the past 240 hour(s))  Resp Panel by RT-PCR (Flu A&B, Covid) Anterior Nasal Swab     Status: None   Collection Time: 04/19/22  8:05 PM   Specimen: Anterior Nasal Swab  Result Value Ref Range Status   SARS Coronavirus 2 by RT PCR NEGATIVE NEGATIVE Final    Comment: (NOTE) SARS-CoV-2 target nucleic acids are NOT DETECTED.  The SARS-CoV-2 RNA is generally detectable in upper respiratory specimens during the acute phase of infection. The lowest concentration of SARS-CoV-2 viral copies this assay can detect is 138 copies/mL. A negative result does not preclude SARS-Cov-2 infection and should not be used as the sole basis for treatment or other patient management decisions. A negative result may occur with  improper specimen collection/handling, submission of specimen other than nasopharyngeal swab, presence of viral mutation(s) within the areas targeted by this assay, and inadequate number of viral copies(<138 copies/mL). A negative result must be combined with clinical observations, patient history, and epidemiological information. The expected result is Negative.  Fact Sheet for Patients:   EntrepreneurPulse.com.au  Fact Sheet for Healthcare Providers:  IncredibleEmployment.be  This test is no t yet approved or cleared by the [place] FDA and  has been authorized for detection and/or diagnosis of SARS-CoV-2 by FDA under an Emergency Use Authorization (EUA). This EUA will remain  in effect (meaning this test can be used) for the duration of the COVID-19 declaration under Section 564(b)(1) of the Act, 21 U.S.C.section 360bbb-3(b)(1), unless the authorization is terminated  or revoked sooner.       Influenza A by PCR NEGATIVE NEGATIVE Final   Influenza B by PCR NEGATIVE NEGATIVE Final    Comment: (NOTE) The Xpert Xpress SARS-CoV-2/FLU/RSV plus assay is intended as an aid in the diagnosis of influenza from Nasopharyngeal swab specimens and should not be used as a sole basis for treatment. Nasal washings and aspirates are unacceptable for Xpert Xpress SARS-CoV-2/FLU/RSV testing.  Fact Sheet for Patients: EntrepreneurPulse.com.au  Fact Sheet for Healthcare Providers: IncredibleEmployment.be  This test is not yet approved or cleared by the [place] FDA and has been authorized for detection and/or diagnosis of SARS-CoV-2 by FDA under an Emergency Use Authorization (EUA). This EUA will remain in effect (meaning this test can be used) for the duration of the COVID-19 declaration under Section 564(b)(1) of the Act, 21 U.S.C. section 360bbb-3(b)(1), unless the authorization is terminated or revoked.  Performed at [facility], [place] [address], [place], [place] [address]   Blood Culture (routine x 2)     Status: None (Preliminary result)   Collection Time: 04/19/22  8:24 PM   Specimen: BLOOD  Result Value Ref Range Status   Specimen Description BLOOD RIGHT ANTECUBITAL  Final   Special Requests   Final    BOTTLES DRAWN AEROBIC AND ANAEROBIC Blood Culture adequate volume   Culture    Final    NO GROWTH 2 DAYS Performed  at [facility], [place] [address], [place], [place] [address]    Report Status PENDING  Incomplete  Blood Culture (routine x 2)     Status: Abnormal (Preliminary result)   Collection Time: 04/19/22  8:33 PM   Specimen: BLOOD  Result Value Ref Range Status   Specimen Description BLOOD RIGHT ANTECUBITAL  Final   Special Requests   Final    BOTTLES DRAWN AEROBIC AND ANAEROBIC Blood Culture adequate volume   Culture  Setup Time   Final    GRAM POSITIVE COCCI IN CHAINS IN BOTH AEROBIC AND ANAEROBIC BOTTLES CRITICAL RESULT CALLED TO, READ BACK BY AND VERIFIED WITH:  C/ ELIZABETH M., PHARMD 04/20/22 1254 A. LAFRANCE    Culture (A)  Final    STREPTOCOCCUS GROUP G SUSCEPTIBILITIES TO FOLLOW Performed at [facility], [place] [address] [place], [place] [address]    Report Status PENDING  Incomplete  Blood Culture ID Panel (Reflexed)     Status: Abnormal   Collection Time: 04/19/22  8:33 PM  Result Value Ref Range Status   Enterococcus faecalis NOT DETECTED NOT DETECTED Final   Enterococcus Faecium NOT DETECTED NOT DETECTED Final   Listeria monocytogenes NOT DETECTED NOT DETECTED Final   Staphylococcus species NOT DETECTED NOT DETECTED Final   Staphylococcus aureus (BCID) NOT DETECTED NOT DETECTED Final   Staphylococcus epidermidis NOT DETECTED NOT DETECTED Final   Staphylococcus lugdunensis NOT DETECTED NOT DETECTED Final   Streptococcus species DETECTED (A) NOT DETECTED Final    Comment: Not Enterococcus species, Streptococcus agalactiae, Streptococcus pyogenes, or Streptococcus pneumoniae. CRITICAL RESULT CALLED TO, READ BACK BY AND VERIFIED WITH:  C/ ELIZABETH M., PHARMD 04/20/22 1254 A. LAFRANCE    Streptococcus agalactiae NOT DETECTED NOT DETECTED Final   Streptococcus pneumoniae NOT DETECTED NOT DETECTED Final   Streptococcus pyogenes NOT DETECTED NOT DETECTED Final   A.calcoaceticus-baumannii NOT DETECTED NOT DETECTED Final    Bacteroides fragilis NOT DETECTED NOT DETECTED Final   Enterobacterales NOT DETECTED NOT DETECTED Final   Enterobacter cloacae complex NOT DETECTED NOT DETECTED Final   Escherichia coli NOT DETECTED NOT DETECTED Final   Klebsiella aerogenes NOT DETECTED NOT DETECTED Final   Klebsiella oxytoca NOT DETECTED NOT DETECTED Final   Klebsiella pneumoniae NOT DETECTED NOT DETECTED Final   Proteus species NOT DETECTED NOT DETECTED Final   Salmonella species NOT DETECTED NOT DETECTED Final   Serratia marcescens NOT DETECTED NOT DETECTED Final   Haemophilus influenzae NOT DETECTED NOT DETECTED Final   Neisseria meningitidis NOT DETECTED NOT DETECTED Final   Pseudomonas aeruginosa NOT DETECTED NOT DETECTED Final   Stenotrophomonas maltophilia NOT DETECTED NOT DETECTED Final   Candida albicans NOT DETECTED NOT DETECTED Final   Candida auris NOT DETECTED NOT DETECTED Final   Candida glabrata NOT DETECTED NOT DETECTED Final   Candida krusei NOT DETECTED NOT DETECTED Final   Candida parapsilosis NOT DETECTED NOT DETECTED Final   Candida tropicalis NOT DETECTED NOT DETECTED Final   Cryptococcus neoformans/gattii NOT DETECTED NOT DETECTED Final    Comment: Performed at [facility], [address] [place], [place] [address]  Urine Culture     Status: Abnormal (Preliminary result)   Collection Time: 04/19/22  9:23 PM   Specimen: In/Out Cath Urine  Result Value Ref Range Status   Specimen Description IN/OUT CATH URINE  Final   Special Requests   Final    NONE Performed at [facility], [place] [address], [place], [place] [address]  Culture >=100,000 COLONIES/mL KLEBSIELLA PNEUMONIAE (A)  Final   Report Status PENDING  Incomplete    Janene Madeira, MSN, NP-C [facility] Infectious Disease [facility] Pager: [phone]  04/21/2022 3:45 PM

## 2022-04-22 ENCOUNTER — Inpatient Hospital Stay (HOSPITAL_COMMUNITY): Payer: Medicare Other

## 2022-04-22 DIAGNOSIS — E1165 Type 2 diabetes mellitus with hyperglycemia: Secondary | ICD-10-CM | POA: Diagnosis not present

## 2022-04-22 DIAGNOSIS — R7881 Bacteremia: Secondary | ICD-10-CM | POA: Diagnosis not present

## 2022-04-22 DIAGNOSIS — D72819 Decreased white blood cell count, unspecified: Secondary | ICD-10-CM | POA: Diagnosis not present

## 2022-04-22 DIAGNOSIS — I10 Essential (primary) hypertension: Secondary | ICD-10-CM | POA: Diagnosis not present

## 2022-04-22 DIAGNOSIS — A419 Sepsis, unspecified organism: Secondary | ICD-10-CM | POA: Diagnosis not present

## 2022-04-22 DIAGNOSIS — B954 Other streptococcus as the cause of diseases classified elsewhere: Secondary | ICD-10-CM | POA: Diagnosis not present

## 2022-04-22 DIAGNOSIS — L03115 Cellulitis of right lower limb: Secondary | ICD-10-CM | POA: Diagnosis not present

## 2022-04-22 LAB — COMPREHENSIVE METABOLIC PANEL
ALT: 21 U/L (ref 0–44)
AST: 27 U/L (ref 15–41)
Albumin: 2.5 g/dL — ABNORMAL LOW (ref 3.5–5.0)
Alkaline Phosphatase: 54 U/L (ref 38–126)
Anion gap: 8 (ref 5–15)
BUN: 19 mg/dL (ref 8–23)
CO2: 23 mmol/L (ref 22–32)
Calcium: 7.9 mg/dL — ABNORMAL LOW (ref 8.9–10.3)
Chloride: 105 mmol/L (ref 98–111)
Creatinine, Ser: 0.95 mg/dL (ref 0.44–1.00)
GFR, Estimated: >60 mL/min (ref >60)
Glucose, Bld: 128 mg/dL — ABNORMAL HIGH (ref 70–99)
Potassium: 3.9 mmol/L (ref 3.5–5.1)
Sodium: 136 mmol/L (ref 135–145)
Total Bilirubin: 1.6 mg/dL — ABNORMAL HIGH (ref 0.3–1.2)
Total Protein: 5.4 g/dL — ABNORMAL LOW (ref 6.5–8.1)

## 2022-04-22 LAB — ECHOCARDIOGRAM COMPLETE
AR max vel: 1.98 cm2
AV Area VTI: 2.32 cm2
AV Area mean vel: 2.16 cm2
AV Mean grad: 6 mmHg
AV Peak grad: 10.9 mmHg
Ao pk vel: 1.65 m/s
Area-P 1/2: 5.06 cm2
Height: 64 in
S' Lateral: 1.8 cm
Weight: 3760 oz

## 2022-04-22 LAB — RETICULOCYTES
Immature Retic Fract: 14.4 % (ref 2.3–15.9)
RBC.: 2.86 MIL/uL — ABNORMAL LOW (ref 3.87–5.11)
Retic Count, Absolute: 70.4 10*3/uL (ref 19.0–186.0)
Retic Ct Pct: 2.5 % (ref 0.4–3.1)

## 2022-04-22 LAB — GLUCOSE, CAPILLARY
Glucose-Capillary: 102 mg/dL — ABNORMAL HIGH (ref 70–99)
Glucose-Capillary: 136 mg/dL — ABNORMAL HIGH (ref 70–99)
Glucose-Capillary: 129 mg/dL — ABNORMAL HIGH (ref 70–99)
Glucose-Capillary: 107 mg/dL — ABNORMAL HIGH (ref 70–99)

## 2022-04-22 LAB — FERRITIN: Ferritin: 422 ng/mL — ABNORMAL HIGH (ref 11–307)

## 2022-04-22 LAB — URINE CULTURE: Culture: >=100000 — AB

## 2022-04-22 LAB — CBC WITH DIFFERENTIAL/PLATELET
Abs Immature Granulocytes: 0.15 10*3/uL — ABNORMAL HIGH (ref 0.00–0.07)
Basophils Absolute: 0 10*3/uL (ref 0.0–0.1)
Basophils Relative: 1 %
Eosinophils Absolute: 0 10*3/uL (ref 0.0–0.5)
Eosinophils Relative: 2 %
HCT: 28.7 % — ABNORMAL LOW (ref 36.0–46.0)
Hemoglobin: 9.6 g/dL — ABNORMAL LOW (ref 12.0–15.0)
Immature Granulocytes: 6 %
Lymphocytes Relative: 20 %
Lymphs Abs: 0.5 10*3/uL — ABNORMAL LOW (ref 0.7–4.0)
MCH: 33.2 pg (ref 26.0–34.0)
MCHC: 33.4 g/dL (ref 30.0–36.0)
MCV: 99.3 fL (ref 80.0–100.0)
Monocytes Absolute: 0.2 10*3/uL (ref 0.1–1.0)
Monocytes Relative: 6 %
Neutro Abs: 1.5 10*3/uL — ABNORMAL LOW (ref 1.7–7.7)
Neutrophils Relative %: 65 %
Platelets: 53 10*3/uL — ABNORMAL LOW (ref 150–400)
RBC: 2.89 MIL/uL — ABNORMAL LOW (ref 3.87–5.11)
RDW: 14.7 % (ref 11.5–15.5)
WBC: 2.4 10*3/uL — ABNORMAL LOW (ref 4.0–10.5)
nRBC: 0 % (ref 0.0–0.2)

## 2022-04-22 LAB — MAGNESIUM: Magnesium: 1.6 mg/dL — ABNORMAL LOW (ref 1.7–2.4)

## 2022-04-22 LAB — CULTURE, BLOOD (ROUTINE X 2): Special Requests: ADEQUATE

## 2022-04-22 LAB — IRON AND TIBC
Iron: 10 ug/dL — ABNORMAL LOW (ref 28–170)
Saturation Ratios: 4 % — ABNORMAL LOW (ref 10.4–31.8)
TIBC: 230 ug/dL — ABNORMAL LOW (ref 250–450)
UIBC: 220 ug/dL

## 2022-04-22 LAB — CK: Total CK: 207 U/L (ref 38–234)

## 2022-04-22 LAB — FOLATE: Folate: 11.8 ng/mL (ref >5.9)

## 2022-04-22 LAB — VITAMIN B12: Vitamin B-12: 1809 pg/mL — ABNORMAL HIGH (ref 180–914)

## 2022-04-22 MED ORDER — MAGNESIUM SULFATE 4 GM/100ML IV SOLN
4.0000 g | Freq: Once | INTRAVENOUS | Status: AC
  Administered 2022-04-22: 4 g via INTRAVENOUS
  Filled 2022-04-22: qty 100

## 2022-04-22 NOTE — Progress Notes (Addendum)
    [facility]ious [facility]    Date of Admission:  04/19/2022   Total days of antibiotics 4           ID: Kathleen Wiley is a 76 y.o. female with marked right leg cellulitis with streptococcal g bacteremia Principal Problem:   Group G streptococcal infection Active Problems:   Essential hypertension, benign   Type 2 diabetes mellitus with hyperglycemia (HCC)   Sepsis (HCC)   Cellulitis of right lower extremity    Subjective: Still tenderness,swelling erythema to right lower leg No fever or chills, tolerating abtx.   Medications:   acetaminophen  1,000 mg Oral Q8H   escitalopram  20 mg Oral Daily   gabapentin  300 mg Oral TID   insulin aspart  0-9 Units Subcutaneous TID WC   linezolid  600 mg Oral Q12H   rosuvastatin  5 mg Oral Daily   cyanocobalamin  1,000 mcg Oral Daily    Objective: Vital signs in last 24 hours: Temp:  [98 F (36.7 C)-99.2 F (37.3 C)] 99.2 F (37.3 C) (06/15 1547) Pulse Rate:  [79-90] 90 (06/15 1547) Resp:  [14-24] 24 (06/15 1547) BP: (101-132)/(46-62) 132/59 (06/15 1547) SpO2:  [91 %-97 %] 91 % (06/15 1547)  Physical Exam  Constitutional:  oriented to person, place, and time. appears well-developed and well-nourished. No distress.  HENT: [place]/AT, PERRLA, no scleral icterus Mouth/Throat: Oropharynx is clear and moist. No oropharyngeal exudate.  Cardiovascular: Normal rate, regular rhythm and normal heart sounds. Exam reveals no gallop and no friction rub.  No murmur heard.  Pulmonary/Chest: Effort normal and breath sounds normal. No respiratory distress.  has no wheezes.  Neck = supple, no nuchal rigidity Abdominal: Soft. Bowel sounds are normal.  exhibits no distension. There is no tenderness.  Ext: right leg, +edema with marked edges, regressed slightly Skin: Skin is warm and dry. No rash noted. No erythema.  Psychiatric: a normal mood and affect.  behavior is normal.    Lab Results Recent Labs    04/21/22 0056  04/22/22 0041  WBC 2.6* 2.4*  HGB 10.0* 9.6*  HCT 29.6* 28.7*  NA 134* 136  K 3.4* 3.9  CL 102 105  CO2 22 23  BUN 25* 19  CREATININE 1.11* 0.95   Liver Panel Recent Labs    04/21/22 0056 04/22/22 0041  PROT 5.4* 5.4*  ALBUMIN 2.7* 2.5*  AST 35 27  ALT 22 21  ALKPHOS 47 54  BILITOT 1.3* 1.6*     Microbiology: Blood cx ngtd on 6/14 Blood cx strep g on 6/12 Urine cx: klebsiella Studies/Results: No results found.  I have reviewed previous providers notes, recent labs results.  Assessment/Plan: Right leg cellulitis with secondary streptococcal bacteremia = will narrow abtx to penicillin. Continue on addn one more day linezolid. Recommend mri or ct of right lower leg to see if any developing abscess since slow to respond and still tender.discussed treatment recs with dr ghimire.  Bacteremia = repeat blood cx pending; awaiting for TTE read  Leukopenia/thrombocytopenia = possibly from sepsis. She does have at baseline decreased WBC. Will continue to monitor  [facility] for Infectious Diseases Pager: [phone]  04/22/2022, 4:46 PM

## 2022-04-22 NOTE — Progress Notes (Addendum)
OT Cancellation Note  Patient Details Name: Kathleen Wiley MRN: [id number] DOB: 1946-03-26   Cancelled Treatment:    Reason Eval/Treat Not Completed: Patient declined, no reason specified Pt reports her husband bringing breakfast "any minute now" and declined to participate in OT eval. Will follow up as schedule permits.  Layla Maw 04/22/2022, 8:35 AM

## 2022-04-22 NOTE — Progress Notes (Addendum)
SATURATION QUALIFICATIONS: (This note is used to comply with regulatory documentation for home oxygen)  Patient Saturations on Room Air at Rest = 86%  Patient Saturations on 1 Liters of oxygen at rest = 93%  Patient Saturations on 1 Liters of oxygen with exertion = 92%  Please briefly explain why patient needs home oxygen: Pt hypoxic on RA at rest, will continue to assess.

## 2022-04-22 NOTE — Progress Notes (Addendum)
PROGRESS NOTE        PATIENT DETAILS Name: Kathleen Wiley Age: 76 y.o. Sex: female Date of Birth: 1946-07-05 Admit Date: 04/19/2022 Admitting Physician Evalee Mutton Kristeen Mans, MD [id number], Ivin Booty, MD  Brief Summary: Patient is a 76 y.o.  female with history of DM-2, HTN, HLD who presented with fever, vomiting, and RLE erythema swelling-found to have sepsis RLE cellulitis with streptococcal bacteremia.   Significant events: 6/12>> admit to [facility] for RLE swelling/cellulitis.  Significant studies: 6/12>> CT  right tibia/fibula: Diffuse subcutaneous edema/swelling-no evidence of soft tissue gas/abscess. 6/12>> CXR: No PNA 6/13>> CT head: No acute abnormalities 6/13>>RLE doppler-neg for DVT 6/15>> vitamin B12 level: Normal   Significant microbiology data: 6/12>> COVID/influenza PCR: Negative 6/12>> urine culture: Klebsiella (colonization) 6/12>> blood culture: 1/2-Streptococcus group G 6/14>> blood culture: No growth  Procedures: None  Consults: None  Subjective: Continues have pain in the RLE-see picture below.  No obvious crepitus or fluctuation apparent.  Objective: Vitals: Blood pressure (!) 114/58, pulse 80, temperature 98.5 F (36.9 C), temperature source Oral, resp. rate 14, height '5\' 4"'$  (1.626 m), weight 106.6 kg, SpO2 93 %.   Exam: Gen Exam:Alert awake-not in any distress HEENT:atraumatic, normocephalic Chest: B/L clear to auscultation anteriorly CVS:S1S2 regular Abdomen:soft non tender, non distended Extremities: See picture below.  But not no crepitus or fluctuant area apparent on exam. Skin: no rash        Pertinent Labs/Radiology:    Latest Ref Rng & Units 04/22/2022   12:41 AM 04/21/2022   12:56 AM 04/20/2022    1:50 AM  CBC  WBC 4.0 - 10.5 K/uL 2.4  2.6  4.3   Hemoglobin 12.0 - 15.0 g/dL 9.6  10.0  12.4   Hematocrit 36.0 - 46.0 % 28.7  29.6  37.8   Platelets 150 - 400 K/uL 53  60  78     Lab Results  Component Value  Date   NA 136 04/22/2022   K 3.9 04/22/2022   CL 105 04/22/2022   CO2 23 04/22/2022      Assessment/Plan: Sepsis due to RLE cellulitis and streptococcal bacteremia: Although sepsis physiology better-no fever-BP stable-she continues to have persistent RLE pain and leukopenia/thrombocytopenia.  Have discussed with Dr. Diana Eves has reviewed images in chart-recommends MRI of the right leg to rule out abscess-he will evaluate-and tentatively scheduled for possible debridement surgery tomorrow if MRI does indeed show a deep tissue infection/abscess.  ID following-on Rocephin/Zyvox-repeat cultures negative-awaiting TTE.  Note-has chronic venous stasis changes in RLE at baseline from prior episodes of cellulitis.  Had very mild cellulitic changes in the left lower extremity that has since resolved.  Acute metabolic encephalopathy: Resolved-likely due to sepsis.  She continues to be completely awake and alert.  AKI: Improved-likely hemodynamically mediated.  Follow periodically.  Hypokalemia: Repleted  Hypomagnesemia: Replete and recheck  Pancytopenia: Suspect due to sepsis/infection-B12 level stable-ferritin count significantly elevated-reflecting acute phase reactant.  Hold SQ heparin-continue to monitor CBC.  Recent PT/INR was negative.  No signs of bleeding.  Follow CBC-repeat INR tomorrow morning.  DM-2 (A1c 5.2 on 6/30): Continue SSI-resume Ozempic on discharge.  Recent Labs    04/21/22 2139 04/22/22 0753 04/22/22 1147  GLUCAP 102* 102* 136*     HTN: Apparently not on any antihypertensives at home-BP was soft yesterday-today more stable-watch closely.  HLD: Continue statin  Depression/anxiety: Resume Lexapro  Morbid Obesity Estimated body mass index is 40.34 kg/m as calculated from the following:   Height as of this encounter: '5\' 4"'$  (1.626 m).   Weight as of this encounter: 106.6 kg.   Code status:   Code Status: Full Code   DVT Prophylaxis: heparin injection 5,000 Units  Start: 04/20/22 0600   Family Communication: Spouse updated on 6/14-no family at bedside on 6/15.   Disposition Plan: Status is: Inpatient Remains inpatient appropriate because: Sepsis physiology due to RLE cellulitis-noted stable for discharge remains on IV antibiotics.  See above regarding plans to obtain MRI   Planned Discharge Destination:Home   Diet: Diet Order             Diet heart healthy/carb modified Room service appropriate? Yes; Fluid consistency: Thin  Diet effective now                     Antimicrobial agents: Anti-infectives (From admission, onward)    Start     Dose/Rate Route Frequency Ordered Stop   04/21/22 2200  linezolid (ZYVOX) tablet 600 mg  Status:  Discontinued        600 mg Oral Every 12 hours 04/21/22 1606 04/21/22 1608   04/21/22 2200  linezolid (ZYVOX) tablet 600 mg        600 mg Oral Every 12 hours 04/21/22 1608 04/23/22 2159   04/21/22 2130  vancomycin (VANCOREADY) IVPB 1500 mg/300 mL  Status:  Discontinued        1,500 mg 150 mL/hr over 120 Minutes Intravenous Every 48 hours 04/20/22 0952 04/21/22 1539   04/20/22 2200  vancomycin (VANCOCIN) IVPB 1000 mg/200 mL premix  Status:  Discontinued        1,000 mg 200 mL/hr over 60 Minutes Intravenous Every 24 hours 04/19/22 2204 04/20/22 0951   04/20/22 2200  cefTRIAXone (ROCEPHIN) 2 g in sodium chloride 0.9 % 100 mL IVPB        2 g 200 mL/hr over 30 Minutes Intravenous Every 24 hours 04/20/22 1401     04/20/22 0430  piperacillin-tazobactam (ZOSYN) IVPB 3.375 g  Status:  Discontinued       See Hyperspace for full Linked Orders Report.   3.375 g 12.5 mL/hr over 240 Minutes Intravenous Every 8 hours 04/19/22 2027 04/20/22 1401   04/19/22 2030  vancomycin (VANCOREADY) IVPB 2000 mg/400 mL        2,000 mg 200 mL/hr over 120 Minutes Intravenous  Once 04/19/22 2027 04/19/22 2346   04/19/22 2030  piperacillin-tazobactam (ZOSYN) IVPB 3.375 g       See Hyperspace for full Linked Orders Report.    3.375 g 100 mL/hr over 30 Minutes Intravenous  Once 04/19/22 2027 04/19/22 2145   04/19/22 2015  vancomycin (VANCOCIN) IVPB 1000 mg/200 mL premix  Status:  Discontinued        1,000 mg 200 mL/hr over 60 Minutes Intravenous  Once 04/19/22 2005 04/19/22 2027        MEDICATIONS: Scheduled Meds:  acetaminophen  1,000 mg Oral Q8H   escitalopram  20 mg Oral Daily   gabapentin  300 mg Oral TID   heparin  5,000 Units Subcutaneous Q8H   insulin aspart  0-9 Units Subcutaneous TID WC   linezolid  600 mg Oral Q12H   rosuvastatin  5 mg Oral Daily   cyanocobalamin  1,000 mcg Oral Daily   Continuous Infusions:  cefTRIAXone (ROCEPHIN)  IV 2 g (04/21/22 2156)   PRN Meds:.morphine injection, ondansetron (ZOFRAN) IV, oxyCODONE  I have personally reviewed following labs and imaging studies  LABORATORY DATA: CBC: Recent Labs  Lab 04/19/22 1730 04/20/22 0150 04/21/22 0056 04/22/22 0041  WBC 4.1 4.3 2.6* 2.4*  NEUTROABS 3.4 3.6  --  1.5*  HGB 13.4 12.4 10.0* 9.6*  HCT 40.4 37.8 29.6* 28.7*  MCV 100.7* 103.0* 97.7 99.3  PLT PLATELET CLUMPS NOTED ON SMEAR, UNABLE TO ESTIMATE 78* 60* 53*     Basic Metabolic Panel: Recent Labs  Lab 04/19/22 1730 04/20/22 0150 04/21/22 0056 04/22/22 0041  NA 136 135 134* 136  K 3.7 3.8 3.4* 3.9  CL 102 99 102 105  CO2 '23 23 22 23  '$ GLUCOSE 177* 135* 104* 128*  BUN 18 22 25* 19  CREATININE 1.05* 1.40* 1.11* 0.95  CALCIUM 9.3 8.8* 7.7* 7.9*  MG  --   --   --  1.6*     GFR: Estimated Creatinine Clearance: 61.9 mL/min (by C-G formula based on SCr of 0.95 mg/dL).  Liver Function Tests: Recent Labs  Lab 04/19/22 1730 04/20/22 0150 04/21/22 0056 04/22/22 0041  AST 36 33 35 27  ALT '29 26 22 21  '$ ALKPHOS 77 62 47 54  BILITOT 1.9* 2.0* 1.3* 1.6*  PROT 7.3 6.3* 5.4* 5.4*  ALBUMIN 4.0 3.4* 2.7* 2.5*    Recent Labs  Lab 04/19/22 1730  LIPASE 32    No results for input(s): "AMMONIA" in the last 168 hours.  Coagulation Profile: Recent  Labs  Lab 04/19/22 2033  INR 1.2     Cardiac Enzymes: Recent Labs  Lab 04/22/22 0041  CKTOTAL 207    BNP (last 3 results) No results for input(s): "PROBNP" in the last 8760 hours.  Lipid Profile: No results for input(s): "CHOL", "HDL", "LDLCALC", "TRIG", "CHOLHDL", "LDLDIRECT" in the last 72 hours.  Thyroid Function Tests: No results for input(s): "TSH", "T4TOTAL", "FREET4", "T3FREE", "THYROIDAB" in the last 72 hours.  Anemia Panel: Recent Labs    04/22/22 0041  VITAMINB12 1,809*  FOLATE 11.8  FERRITIN 422*  TIBC 230*  IRON 10*  RETICCTPCT 2.5    Urine analysis:    Component Value Date/Time   COLORURINE AMBER (A) 04/19/2022 2123   APPEARANCEUR HAZY (A) 04/19/2022 2123   LABSPEC 1.013 04/19/2022 2123   PHURINE 5.0 04/19/2022 2123   GLUCOSEU NEGATIVE 04/19/2022 2123   HGBUR NEGATIVE 04/19/2022 2123   BILIRUBINUR NEGATIVE 04/19/2022 2123   KETONESUR NEGATIVE 04/19/2022 2123   PROTEINUR NEGATIVE 04/19/2022 2123   UROBILINOGEN 1.0 08/06/2012 2046   NITRITE NEGATIVE 04/19/2022 2123   LEUKOCYTESUR NEGATIVE 04/19/2022 2123    Sepsis Labs: Lactic Acid, Venous    Component Value Date/Time   LATICACIDVEN 3.8 ([place]) 04/20/2022 0605    MICROBIOLOGY: Recent Results (from the past 240 hour(s))  Resp Panel by RT-PCR (Flu A&B, Covid) Anterior Nasal Swab     Status: None   Collection Time: 04/19/22  8:05 PM   Specimen: Anterior Nasal Swab  Result Value Ref Range Status   SARS Coronavirus 2 by RT PCR NEGATIVE NEGATIVE Final    Comment: (NOTE) SARS-CoV-2 target nucleic acids are NOT DETECTED.  The SARS-CoV-2 RNA is generally detectable in upper respiratory specimens during the acute phase of infection. The lowest concentration of SARS-CoV-2 viral copies this assay can detect is 138 copies/mL. A negative result does not preclude SARS-Cov-2 infection and should not be used as the sole basis for treatment or other patient management decisions. A negative result may  occur with  improper specimen collection/handling, submission of specimen  other than nasopharyngeal swab, presence of viral mutation(s) within the areas targeted by this assay, and inadequate number of viral copies(<138 copies/mL). A negative result must be combined with clinical observations, patient history, and epidemiological information. The expected result is Negative.  Fact Sheet for Patients:  EntrepreneurPulse.com.au  Fact Sheet for Healthcare Providers:  IncredibleEmployment.be  This test is no t yet approved or cleared by the [place] FDA and  has been authorized for detection and/or diagnosis of SARS-CoV-2 by FDA under an Emergency Use Authorization (EUA). This EUA will remain  in effect (meaning this test can be used) for the duration of the COVID-19 declaration under Section 564(b)(1) of the Act, 21 U.S.C.section 360bbb-3(b)(1), unless the authorization is terminated  or revoked sooner.       Influenza A by PCR NEGATIVE NEGATIVE Final   Influenza B by PCR NEGATIVE NEGATIVE Final    Comment: (NOTE) The Xpert Xpress SARS-CoV-2/FLU/RSV plus assay is intended as an aid in the diagnosis of influenza from Nasopharyngeal swab specimens and should not be used as a sole basis for treatment. Nasal washings and aspirates are unacceptable for Xpert Xpress SARS-CoV-2/FLU/RSV testing.  Fact Sheet for Patients: EntrepreneurPulse.com.au  Fact Sheet for Healthcare Providers: IncredibleEmployment.be  This test is not yet approved or cleared by the [place] FDA and has been authorized for detection and/or diagnosis of SARS-CoV-2 by FDA under an Emergency Use Authorization (EUA). This EUA will remain in effect (meaning this test can be used) for the duration of the COVID-19 declaration under Section 564(b)(1) of the Act, 21 U.S.C. section 360bbb-3(b)(1), unless the authorization is terminated  or revoked.  Performed at [facility], [place] [address] [place], [place] [address]   Blood Culture (routine x 2)     Status: None (Preliminary result)   Collection Time: 04/19/22  8:24 PM   Specimen: BLOOD  Result Value Ref Range Status   Specimen Description BLOOD RIGHT ANTECUBITAL  Final   Special Requests   Final    BOTTLES DRAWN AEROBIC AND ANAEROBIC Blood Culture adequate volume   Culture   Final    NO GROWTH 3 DAYS Performed at [facility], [place] [address] [place], [place] [address]    Report Status PENDING  Incomplete  Blood Culture (routine x 2)     Status: Abnormal   Collection Time: 04/19/22  8:33 PM   Specimen: BLOOD  Result Value Ref Range Status   Specimen Description BLOOD RIGHT ANTECUBITAL  Final   Special Requests   Final    BOTTLES DRAWN AEROBIC AND ANAEROBIC Blood Culture adequate volume   Culture  Setup Time   Final    GRAM POSITIVE COCCI IN CHAINS IN BOTH AEROBIC AND ANAEROBIC BOTTLES CRITICAL RESULT CALLED TO, READ BACK BY AND VERIFIED WITH:  C/ ELIZABETH M., PHARMD 04/20/22 1254 A. LAFRANCE Performed at [facility], [place] [address] [place] [address] [place], [place] [address]    Culture STREPTOCOCCUS GROUP G (A)  Final   Report Status 04/22/2022 FINAL  Final   Organism ID, Bacteria STREPTOCOCCUS GROUP G  Final      Susceptibility   Streptococcus group g - MIC*    CLINDAMYCIN RESISTANT Resistant     AMPICILLIN <=0.25 SENSITIVE Sensitive     VANCOMYCIN 0.25 SENSITIVE Sensitive     CEFTRIAXONE <=0.12 SENSITIVE Sensitive     LEVOFLOXACIN 2 SENSITIVE Sensitive     * STREPTOCOCCUS GROUP G  Blood Culture ID Panel (Reflexed)     Status: Abnormal  Collection Time: 04/19/22  8:33 PM  Result Value Ref Range Status   Enterococcus faecalis NOT DETECTED NOT DETECTED Final   Enterococcus Faecium NOT DETECTED NOT DETECTED Final   Listeria monocytogenes NOT DETECTED NOT DETECTED Final   Staphylococcus species NOT DETECTED NOT DETECTED Final   Staphylococcus  aureus (BCID) NOT DETECTED NOT DETECTED Final   Staphylococcus epidermidis NOT DETECTED NOT DETECTED Final   Staphylococcus lugdunensis NOT DETECTED NOT DETECTED Final   Streptococcus species DETECTED (A) NOT DETECTED Final    Comment: Not Enterococcus species, Streptococcus agalactiae, Streptococcus pyogenes, or Streptococcus pneumoniae. CRITICAL RESULT CALLED TO, READ BACK BY AND VERIFIED WITH:  C/ ELIZABETH M., PHARMD 04/20/22 1254 A. LAFRANCE    Streptococcus agalactiae NOT DETECTED NOT DETECTED Final   Streptococcus pneumoniae NOT DETECTED NOT DETECTED Final   Streptococcus pyogenes NOT DETECTED NOT DETECTED Final   A.calcoaceticus-baumannii NOT DETECTED NOT DETECTED Final   Bacteroides fragilis NOT DETECTED NOT DETECTED Final   Enterobacterales NOT DETECTED NOT DETECTED Final   Enterobacter cloacae complex NOT DETECTED NOT DETECTED Final   Escherichia coli NOT DETECTED NOT DETECTED Final   Klebsiella aerogenes NOT DETECTED NOT DETECTED Final   Klebsiella oxytoca NOT DETECTED NOT DETECTED Final   Klebsiella pneumoniae NOT DETECTED NOT DETECTED Final   Proteus species NOT DETECTED NOT DETECTED Final   Salmonella species NOT DETECTED NOT DETECTED Final   Serratia marcescens NOT DETECTED NOT DETECTED Final   Haemophilus influenzae NOT DETECTED NOT DETECTED Final   Neisseria meningitidis NOT DETECTED NOT DETECTED Final   Pseudomonas aeruginosa NOT DETECTED NOT DETECTED Final   Stenotrophomonas maltophilia NOT DETECTED NOT DETECTED Final   Candida albicans NOT DETECTED NOT DETECTED Final   Candida auris NOT DETECTED NOT DETECTED Final   Candida glabrata NOT DETECTED NOT DETECTED Final   Candida krusei NOT DETECTED NOT DETECTED Final   Candida parapsilosis NOT DETECTED NOT DETECTED Final   Candida tropicalis NOT DETECTED NOT DETECTED Final   Cryptococcus neoformans/gattii NOT DETECTED NOT DETECTED Final    Comment: Performed at [facility], [address], [place], [place]  [address]  Urine Culture     Status: Abnormal   Collection Time: 04/19/22  9:23 PM   Specimen: In/Out Cath Urine  Result Value Ref Range Status   Specimen Description IN/OUT CATH URINE  Final   Special Requests   Final    NONE Performed at [facility], [place] [address] [place], [place] [address]    Culture >=100,000 COLONIES/mL KLEBSIELLA PNEUMONIAE (A)  Final   Report Status 04/22/2022 FINAL  Final   Organism ID, Bacteria KLEBSIELLA PNEUMONIAE (A)  Final      Susceptibility   Klebsiella pneumoniae - MIC*    AMPICILLIN RESISTANT Resistant     CEFAZOLIN <=4 SENSITIVE Sensitive     CEFEPIME <=0.12 SENSITIVE Sensitive     CEFTRIAXONE <=0.25 SENSITIVE Sensitive     CIPROFLOXACIN <=0.25 SENSITIVE Sensitive     GENTAMICIN <=1 SENSITIVE Sensitive     IMIPENEM 0.5 SENSITIVE Sensitive     NITROFURANTOIN 128 RESISTANT Resistant     TRIMETH/SULFA <=20 SENSITIVE Sensitive     AMPICILLIN/SULBACTAM <=2 SENSITIVE Sensitive     PIP/TAZO <=4 SENSITIVE Sensitive     * >=100,000 COLONIES/mL KLEBSIELLA PNEUMONIAE  Culture, blood (Routine X 2) w Reflex to ID Panel     Status: None (Preliminary result)   Collection Time: 04/21/22  1:56 PM   Specimen: BLOOD  Result Value Ref Range Status   Specimen  Description BLOOD RIGHT ANTECUBITAL  Final   Special Requests   Final    BOTTLES DRAWN AEROBIC AND ANAEROBIC Blood Culture adequate volume   Culture   Final    NO GROWTH < 24 HOURS Performed at [facility], [address] [place], [place] [address]    Report Status PENDING  Incomplete  Culture, blood (Routine X 2) w Reflex to ID Panel     Status: None (Preliminary result)   Collection Time: 04/21/22  1:57 PM   Specimen: BLOOD LEFT HAND  Result Value Ref Range Status   Specimen Description BLOOD LEFT HAND  Final   Special Requests   Final    BOTTLES DRAWN AEROBIC ONLY Blood Culture results may not be optimal due to an inadequate volume of blood received in culture bottles   Culture   Final     NO GROWTH < 24 HOURS Performed at [facility], [place] [address], [place], [place] [address]    Report Status PENDING  Incomplete    RADIOLOGY STUDIES/RESULTS: No results found.   LOS: 2 days   Oren Binet, MD  [facility]    To contact the attending provider between 7A-7P or the covering provider during after hours 7P-7A, please log into the web site [email or web address] and access using universal [place] password for that web site. If you do not have the password, please call the hospital operator.  04/22/2022, 12:28 PM

## 2022-04-22 NOTE — Evaluation (Signed)
Occupational Therapy Evaluation Patient Details Name: Kathleen Wiley MRN: [id number] DOB: 1946/05/15 Today's Date: 04/22/2022   History of Present Illness 76 yo female with onset of nausea, vomiting, fever, and hypotension  was admitted 6/12 after admit two weeks ago for RLE cellulitis.  Pt has currently redness and major pain on BLE's, R >L and dx sepsis.  Cleared for deep wound, has ongoing BP drops, LE edema and generalized weakness.  PMHx: DM, HTN, HLD, cellulitis   Clinical Impression   PTA, pt lives with family, typically Modified Independent with ADLs, IADLs and mobility using cane. Pt presents with deficits in R LE pain/edema, cardiopulmonary tolerance, and overall strength. Pt requires Setup for UB ADLs, Min A for LB ADLs and min guard for basic transfers using RW. Educated pt/husband re: compensatory strategies for LB ADLs, energy conservation, edema mgmt. Pt reports plan to DC to son & DIL's home as DIL works from home and can assist at [place]. Anticipate no OT needs but will continue to follow acutely to progress ADL independence and endurance.      Recommendations for follow up therapy are one component of a multi-disciplinary discharge planning process, led by the attending physician.  Recommendations may be updated based on patient status, additional functional criteria and insurance authorization.   Follow Up Recommendations  No OT follow up    Assistance Recommended at Discharge Intermittent Supervision/Assistance  Patient can return home with the following A little help with bathing/dressing/bathroom;Assistance with cooking/housework;Help with stairs or ramp for entrance;Assist for transportation    Functional Status Assessment  Patient has had a recent decline in their functional status and demonstrates the ability to make significant improvements in function in a reasonable and predictable amount of time.  Equipment Recommendations  Other (comment) (RW)    Recommendations  for Other Services       Precautions / Restrictions Precautions Precautions: Fall Precaution Comments: monitor BP/SpO2 Restrictions Weight Bearing Restrictions: No      Mobility Bed Mobility               General bed mobility comments: up in chair on entry    Transfers Overall transfer level: Needs assistance Equipment used: Rolling walker (2 wheels), 1 person hand held assist Transfers: Sit to/from Stand Sit to Stand: Min guard                  Balance Overall balance assessment: Needs assistance Sitting-balance support: Feet supported Sitting balance-Leahy Scale: Fair     Standing balance support: No upper extremity supported, During functional activity, Bilateral upper extremity supported Standing balance-Leahy Scale: Fair Standing balance comment: able to stand without UE support, benefits from RW to offload painful LE                           ADL either performed or assessed with clinical judgement   ADL Overall ADL's : Needs assistance/impaired Eating/Feeding: Independent   Grooming: Set up;Sitting   Upper Body Bathing: Set up;Sitting   Lower Body Bathing: Supervison/ safety;Sit to/from stand;Sitting/lateral leans   Upper Body Dressing : Set up;Sitting   Lower Body Dressing: Minimal assistance;Sit to/from stand Lower Body Dressing Details (indicate cue type and reason): reports using AE for LB ADLs. pt able to demo ability to stand without UE support to simulate donning pants over waist. discussed donning affected LE first Toilet Transfer: Min guard;Stand-pivot;BSC/3in1;Rolling walker (2 wheels)   Toileting- Clothing Manipulation and Hygiene: Sitting/lateral lean;Sit to/from stand;Min guard  General ADL Comments: Pt with limitations d/t R LE discomfort/swelling, as well as endurance and strength deficits     Vision Ability to See in Adequate Light: 0 Adequate Patient Visual Report: No change from baseline Vision  Assessment?: No apparent visual deficits     Perception     Praxis      Pertinent Vitals/Pain Pain Assessment Pain Assessment: Faces Faces Pain Scale: Hurts little more Pain Location: R LE with WB/touch Pain Descriptors / Indicators: Sore Pain Intervention(s): Monitored during session     Hand Dominance Right   Extremity/Trunk Assessment Upper Extremity Assessment Upper Extremity Assessment: Overall WFL for tasks assessed   Lower Extremity Assessment Lower Extremity Assessment: Defer to PT evaluation   Cervical / Trunk Assessment Cervical / Trunk Assessment: Normal   Communication Communication Communication: No difficulties   Cognition Arousal/Alertness: Awake/alert Behavior During Therapy: WFL for tasks assessed/performed Overall Cognitive Status: Within Functional Limits for tasks assessed                                       General Comments  Reports wooziness in standing, BP WFL. Husband present at bedside    Exercises     Shoulder [place] expects to be discharged to:: Private residence Living Arrangements: Spouse/significant other Available Help at Discharge: Family;Available 24 hours/day Type of Home: House Home Access: Stairs to enter [employer] of Steps: 2 Entrance Stairs-Rails: Can reach both Home Layout: Two level;Laundry or work area in basement;Able to live on main level with bedroom/bathroom Alternate Therapist, sports of Steps: flight   Bathroom Shower/Tub: Teacher, early years/pre: Standard     Home Equipment: Cane - single point;Grab bars - tub/shower;Rollator (4 wheels);Adaptive equipment Adaptive Equipment: Reacher;Sock aid;Long-handled shoe horn Additional Comments: reports plan to DC to son and DIL's home at DC as DIL works from home and wants to assist pt      Prior Functioning/Environment Prior Level of Function : Independent/Modified  Independent;Driving             Mobility Comments: used SPC to go out of the house only ADLs Comments: independent for all ADLs/IADLs. uses AE for LB ADLs        OT Problem List: Decreased strength;Decreased activity tolerance;Impaired balance (sitting and/or standing);Cardiopulmonary status limiting activity;Decreased knowledge of use of DME or AE;Pain      OT Treatment/Interventions: Self-care/ADL training;Therapeutic exercise;Energy conservation;DME and/or AE instruction;Therapeutic activities;Patient/family education;Balance training    OT Goals(Current goals can be found in the care plan section) Acute Rehab OT Goals Patient Stated Goal: resolve LE swelling, go home soon OT Goal Formulation: With patient/family Time For Goal Achievement: 05/06/22 Potential to Achieve Goals: Good  OT Frequency: Min 2X/week    Co-evaluation              AM-PAC OT "6 Clicks" Daily Activity     Outcome Measure Help from another person eating meals?: None Help from another person taking care of personal grooming?: A Little Help from another person toileting, which includes using toliet, bedpan, or urinal?: A Little Help from another person bathing (including washing, rinsing, drying)?: A Little Help from another person to put on and taking off regular upper body clothing?: A Little Help from another person to put on and taking off regular lower body clothing?: A Little 6 Click Score: 19   End of Session Equipment  Utilized During Treatment: Rolling walker (2 wheels);Oxygen Nurse Communication: Mobility status;Other (comment) (ace wrap coming off of LE)  Activity Tolerance: Patient tolerated treatment well Patient left: in chair;with call bell/phone within reach;with family/visitor present  OT Visit Diagnosis: Unsteadiness on feet (R26.81);Other abnormalities of gait and mobility (R26.89);Pain Pain - Right/Left: Right Pain - part of body: Leg                Time: 2897-9150 OT Time  Calculation (min): 18 min Charges:  OT General Charges $OT Visit: 1 Visit OT Evaluation $OT Eval Moderate Complexity: 1 Mod  Malachy Chamber, OTR/L Acute Rehab Services Office: [phone]   Layla Maw 04/22/2022, 1:37 PM

## 2022-04-22 NOTE — Progress Notes (Signed)
  Echocardiogram 2D Echocardiogram has been performed.  Kathleen Wiley 04/22/2022, 10:59 AM

## 2022-04-22 NOTE — Progress Notes (Signed)
Physical Therapy Treatment Patient Details Name: Kathleen Wiley MRN: [id number] DOB: January 02, 1946 Today's Date: 04/22/2022   History of Present Illness 76 yo female with onset of nausea, vomiting, fever, and hypotension  was admitted 6/12 after admit two weeks ago for RLE cellulitis.  Pt has currently redness and major pain on BLE's, R >L and dx sepsis.  Cleared for deep wound, has ongoing BP drops, LE edema and generalized weakness.  PMHx: DM, HTN, HLD, cellulitis    PT Comments    Pt received in supine, agreeable to therapy session after recent premedication with morphine, pt with good participation and tolerance for transfer training and gait training at bedside. Pt hypoxic on RA, continues to require 1L O2 [place] to maintain >90% SpO2 at rest/with exertion and audible wheezing when pt in supine, pt may benefit from incentive spirometer for improved pulmonary clearance and endurance building. Pt needing greatly increased time to perform all tasks with up to modA (+2 for line assist only) for transfers and minA for gait with RW less than household distances. Pt reports moderate to severe fatigue (7/10 modified RPE) at end of session, agreeable to sit up in recliner with RLE elevated, spouse present. Pt continues to benefit from PT services to progress toward functional mobility goals.    Recommendations for follow up therapy are one component of a multi-disciplinary discharge planning process, led by the attending physician.  Recommendations may be updated based on patient status, additional functional criteria and insurance authorization.  Follow Up Recommendations  Home health PT     Assistance Recommended at Discharge Intermittent Supervision/Assistance  Patient can return home with the following A little help with walking and/or transfers;A little help with bathing/dressing/bathroom;Assistance with cooking/housework;Assist for transportation;Help with stairs or ramp for entrance   Equipment  Recommendations  Rolling walker (2 wheels)    Recommendations for Other Services       Precautions / Restrictions Precautions Precautions: Fall Precaution Comments: monitor BP/SpO2 Restrictions Weight Bearing Restrictions: No     Mobility  Bed Mobility Overal bed mobility: Needs Assistance Bed Mobility: Rolling, Sidelying to Sit Rolling: Min guard Sidelying to sit: Min assist, HOB elevated       General bed mobility comments: pt using R elbow to prop up on to sit up to R EOB, minA to guide trunk upright/hips anterior    Transfers Overall transfer level: Needs assistance Equipment used: Rolling walker (2 wheels), 1 person hand held assist Transfers: Sit to/from Stand, Bed to chair/wheelchair/BSC Sit to Stand: Mod assist, +2 safety/equipment (second person to manage lines)   Step pivot transfers: Min assist, +2 safety/equipment       General transfer comment: Pt unable to reach upright with minA +2 initially; after bed height raised 2-3" and cues for momentum strategy, pt able to stand with modA, second person assist with lines. Also able to stand from chair using momentum    Ambulation/Gait Ambulation/Gait assistance: Min assist Gait Distance (Feet): 12 Feet (60f to chair, seated break, then 165fat bedside) Assistive device: Rolling walker (2 wheels) Gait Pattern/deviations: Step-to pattern, Decreased stride length, Wide base of support, Trunk flexed Gait velocity: reduced     General Gait Details: SpO2 90-92% on 1L O2 [place] with short gait task at bedside, pt requiring increased time to perform 2/2 pain/fatigue; pt c/o 7/10 or greater modified RPE after 123fo defer longer trial; fair use of RW but needs cues for proximity to device   Stairs  Wheelchair Mobility    Modified Rankin (Stroke Patients Only)       Balance Overall balance assessment: Needs assistance Sitting-balance support: Feet supported Sitting balance-Leahy Scale: Fair    Postural control: Posterior lean Standing balance support: Bilateral upper extremity supported, Reliant on assistive device for balance, During functional activity Standing balance-Leahy Scale: Poor                              Cognition Arousal/Alertness: Awake/alert Behavior During Therapy: WFL for tasks assessed/performed Overall Cognitive Status: Within Functional Limits for tasks assessed                                 General Comments: pt slow to perform mobiltiy but pleasantly cooperative, needs reminders for safe UE placement, reports baseline incontinence uses briefs at home.        Exercises Other Exercises Other Exercises: LLE AROM: heel slides, hip abduction, SLR x10 reps Other Exercises: BLE AROM: ankle pumps, SLR x5 reps ea    General Comments General comments (skin integrity, edema, etc.): BP 114/58 taken supine prior to OOB, pt denies lightheadedness with transfers; HR 82-87 bpm and SpO2 90-93% on 1L O2 [place], pt desat to 86% on RA resting prior to OOB      Pertinent Vitals/Pain Pain Assessment Pain Assessment: 0-10 Pain Score: 7  Faces Pain Scale: Hurts even more Pain Location: RLE 7/10 with gait, 4/10 resting after morphine given just prior to session Pain Descriptors / Indicators: Grimacing, Guarding, Tender Pain Intervention(s): Limited activity within patient's tolerance, Monitored during session, Premedicated before session, Repositioned (RLE elevated pre/post session)    Home Living                          Prior Function            PT Goals (current goals can now be found in the care plan section) Acute Rehab PT Goals Patient Stated Goal: to get home and stay with DIL, less pain in RLE PT Goal Formulation: With patient Time For Goal Achievement: 04/28/22 Progress towards PT goals: Progressing toward goals    Frequency    Min 3X/week      PT Plan Current plan remains appropriate    Co-evaluation               AM-PAC PT "6 Clicks" Mobility   Outcome Measure  Help needed turning from your back to your side while in a flat bed without using bedrails?: A Little Help needed moving from lying on your back to sitting on the side of a flat bed without using bedrails?: A Lot (heavy use of bed rail) Help needed moving to and from a bed to a chair (including a wheelchair)?: A Lot Help needed standing up from a chair using your arms (e.g., wheelchair or bedside chair)?: A Lot Help needed to walk in hospital room?: A Little Help needed climbing 3-5 steps with a railing? : A Lot 6 Click Score: 14    End of Session Equipment Utilized During Treatment: Gait belt;Oxygen Activity Tolerance: Patient limited by fatigue;Patient tolerated treatment well;Patient limited by pain Patient left: with call bell/phone within reach;with family/visitor present;in chair (spouse present in room) Nurse Communication: Mobility status PT Visit Diagnosis: Unsteadiness on feet (R26.81);Muscle weakness (generalized) (M62.81);Pain Pain - Right/Left: Right Pain - part of body: Leg  Time: 2841-3244 PT Time Calculation (min) (ACUTE ONLY): 49 min  Charges:  $Gait Training: 8-22 mins $Therapeutic Exercise: 8-22 mins $Therapeutic Activity: 8-22 mins                     Karlissa Aron P., PTA Acute Rehabilitation Services Secure Chat Preferred 9a-5:30pm Office: [place] 04/22/2022, 12:45 PM

## 2022-04-23 ENCOUNTER — Other Ambulatory Visit: Payer: Self-pay

## 2022-04-23 ENCOUNTER — Inpatient Hospital Stay (HOSPITAL_COMMUNITY): Payer: Medicare Other

## 2022-04-23 ENCOUNTER — Encounter (HOSPITAL_COMMUNITY)
Admission: EM | Disposition: A | Discharge status: Home Health | Payer: Self-pay | Source: Home / Self Care | Attending: Internal Medicine

## 2022-04-23 ENCOUNTER — Encounter (HOSPITAL_COMMUNITY): Payer: Self-pay | Admitting: Internal Medicine

## 2022-04-23 ENCOUNTER — Inpatient Hospital Stay (HOSPITAL_COMMUNITY): Payer: Medicare Other | Admitting: Certified Registered"

## 2022-04-23 DIAGNOSIS — I10 Essential (primary) hypertension: Secondary | ICD-10-CM | POA: Diagnosis not present

## 2022-04-23 DIAGNOSIS — I4891 Unspecified atrial fibrillation: Secondary | ICD-10-CM | POA: Diagnosis not present

## 2022-04-23 DIAGNOSIS — L03115 Cellulitis of right lower limb: Secondary | ICD-10-CM | POA: Diagnosis not present

## 2022-04-23 DIAGNOSIS — B954 Other streptococcus as the cause of diseases classified elsewhere: Secondary | ICD-10-CM

## 2022-04-23 DIAGNOSIS — L02415 Cutaneous abscess of right lower limb: Secondary | ICD-10-CM | POA: Diagnosis not present

## 2022-04-23 DIAGNOSIS — D72819 Decreased white blood cell count, unspecified: Secondary | ICD-10-CM | POA: Diagnosis not present

## 2022-04-23 DIAGNOSIS — G4730 Sleep apnea, unspecified: Secondary | ICD-10-CM

## 2022-04-23 DIAGNOSIS — R7881 Bacteremia: Secondary | ICD-10-CM | POA: Diagnosis not present

## 2022-04-23 HISTORY — PX: I & D EXTREMITY: SHX5045

## 2022-04-23 LAB — CBC WITH DIFFERENTIAL/PLATELET
Abs Immature Granulocytes: 0.07 10*3/uL (ref 0.00–0.07)
Basophils Absolute: 0 10*3/uL (ref 0.0–0.1)
Basophils Relative: 1 %
Eosinophils Absolute: 0.1 10*3/uL (ref 0.0–0.5)
Eosinophils Relative: 3 %
HCT: 27.3 % — ABNORMAL LOW (ref 36.0–46.0)
Hemoglobin: 9.1 g/dL — ABNORMAL LOW (ref 12.0–15.0)
Immature Granulocytes: 3 %
Lymphocytes Relative: 24 %
Lymphs Abs: 0.6 10*3/uL — ABNORMAL LOW (ref 0.7–4.0)
MCH: 32.9 pg (ref 26.0–34.0)
MCHC: 33.3 g/dL (ref 30.0–36.0)
MCV: 98.6 fL (ref 80.0–100.0)
Monocytes Absolute: 0.2 10*3/uL (ref 0.1–1.0)
Monocytes Relative: 10 %
Neutro Abs: 1.5 10*3/uL — ABNORMAL LOW (ref 1.7–7.7)
Neutrophils Relative %: 59 %
Platelets: 58 10*3/uL — ABNORMAL LOW (ref 150–400)
RBC: 2.77 MIL/uL — ABNORMAL LOW (ref 3.87–5.11)
RDW: 14.5 % (ref 11.5–15.5)
WBC: 2.5 10*3/uL — ABNORMAL LOW (ref 4.0–10.5)
nRBC: 0 % (ref 0.0–0.2)

## 2022-04-23 LAB — GLUCOSE, CAPILLARY
Glucose-Capillary: 103 mg/dL — ABNORMAL HIGH (ref 70–99)
Glucose-Capillary: 88 mg/dL (ref 70–99)
Glucose-Capillary: 90 mg/dL (ref 70–99)
Glucose-Capillary: 93 mg/dL (ref 70–99)
Glucose-Capillary: 129 mg/dL — ABNORMAL HIGH (ref 70–99)

## 2022-04-23 LAB — BASIC METABOLIC PANEL
Anion gap: 9 (ref 5–15)
BUN: 15 mg/dL (ref 8–23)
CO2: 23 mmol/L (ref 22–32)
Calcium: 8.2 mg/dL — ABNORMAL LOW (ref 8.9–10.3)
Chloride: 103 mmol/L (ref 98–111)
Creatinine, Ser: 0.83 mg/dL (ref 0.44–1.00)
GFR, Estimated: >60 mL/min (ref >60)
Glucose, Bld: 116 mg/dL — ABNORMAL HIGH (ref 70–99)
Potassium: 3.9 mmol/L (ref 3.5–5.1)
Sodium: 135 mmol/L (ref 135–145)

## 2022-04-23 LAB — PROTIME-INR
INR: 1.1 (ref 0.8–1.2)
Prothrombin Time: 14.5 seconds (ref 11.4–15.2)

## 2022-04-23 LAB — MRSA NEXT GEN BY PCR, NASAL: MRSA by PCR Next Gen: NOT DETECTED

## 2022-04-23 LAB — HEPATIC FUNCTION PANEL
ALT: 16 U/L (ref 0–44)
AST: 21 U/L (ref 15–41)
Albumin: 2.3 g/dL — ABNORMAL LOW (ref 3.5–5.0)
Alkaline Phosphatase: 64 U/L (ref 38–126)
Bilirubin, Direct: 0.8 mg/dL — ABNORMAL HIGH (ref 0.0–0.2)
Indirect Bilirubin: 1 mg/dL — ABNORMAL HIGH (ref 0.3–0.9)
Total Bilirubin: 1.8 mg/dL — ABNORMAL HIGH (ref 0.3–1.2)
Total Protein: 5.2 g/dL — ABNORMAL LOW (ref 6.5–8.1)

## 2022-04-23 LAB — CK: Total CK: 100 U/L (ref 38–234)

## 2022-04-23 LAB — PROCALCITONIN: Procalcitonin: 11.24 ng/mL

## 2022-04-23 LAB — MAGNESIUM: Magnesium: 2.2 mg/dL (ref 1.7–2.4)

## 2022-04-23 IMAGING — MR MR [PERSON_NAME] LOW W/O CM*R*
18 series · 40 of 40 positions shown · non-contrast
Comparison: Right tibia and fibula radiographs [DATE]; CT right
tibia and fibula [DATE]

CLINICAL DATA: Soft tissue infection suspected. Hospital admission
redness and pain. Sepsis. History of diabetes.

EXAM:
MRI OF LOWER RIGHT EXTREMITY WITHOUT CONTRAST
TECHNIQUE: Multiplanar, multisequence MR imaging of the right tibia and fibula
was performed. No intravenous contrast was administered.

[Series 3: T1 · coronal · right · 5.0mm · 1.25mm/px · 3 of 36 slices shown (1 of 4)]
[im 1/36]
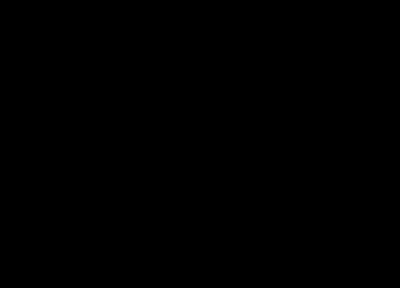
[im 18/36]
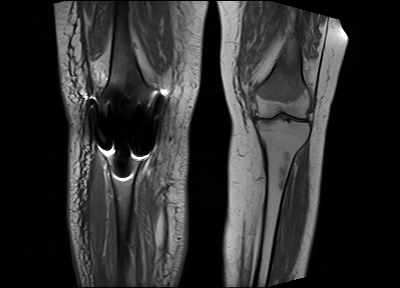
[im 36/36]
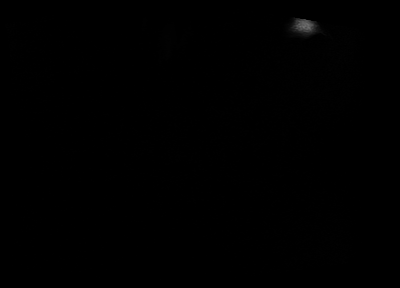

[Series 4: T1 · coronal · right · 5.0mm · 1.25mm/px · 3 of 36 slices shown (2 of 4)]
[im 1/36]
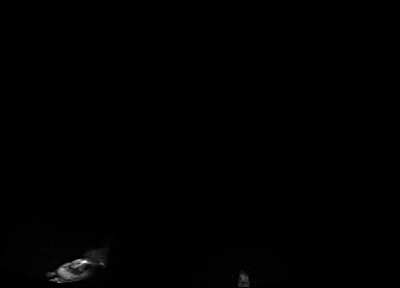
[im 18/36]
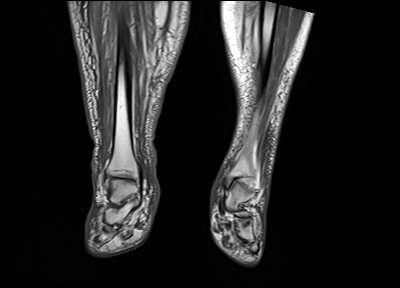
[im 36/36]
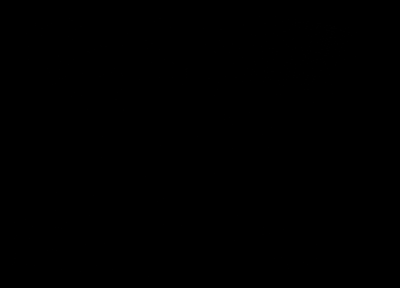

[Series 5: composed cor t1_comp · coronal · right · 6.0mm · 1.25mm/px · 2 of 34 slices shown]
[im 1/34]
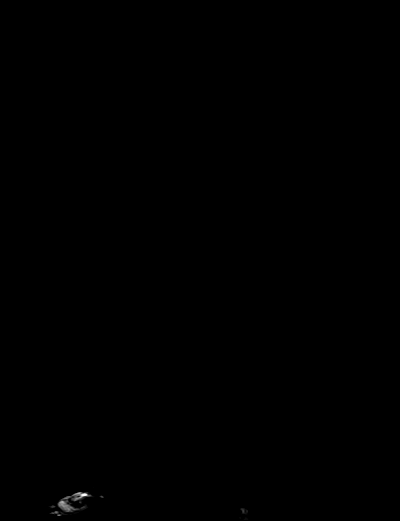
[im 34/34]
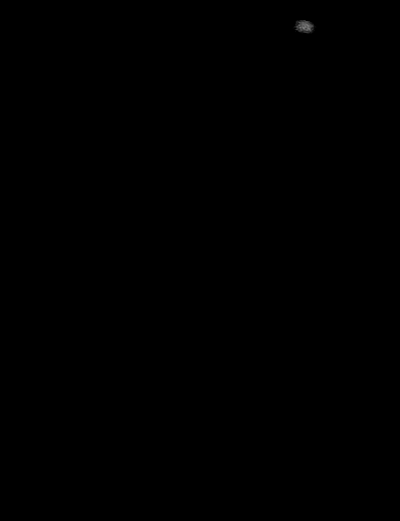

[Series 6: composed cor t1_comp_filt · coronal · right · 6.0mm · 1.25mm/px · 2 of 34 slices shown]
[im 1/34]
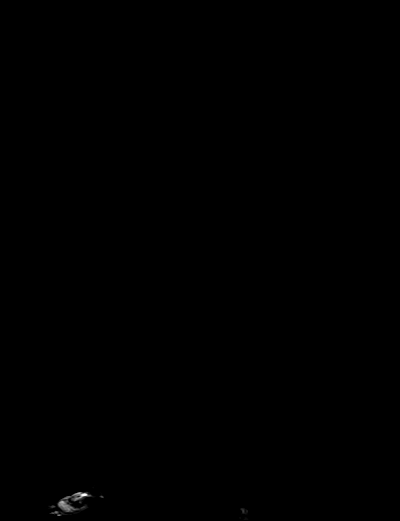
[im 34/34]
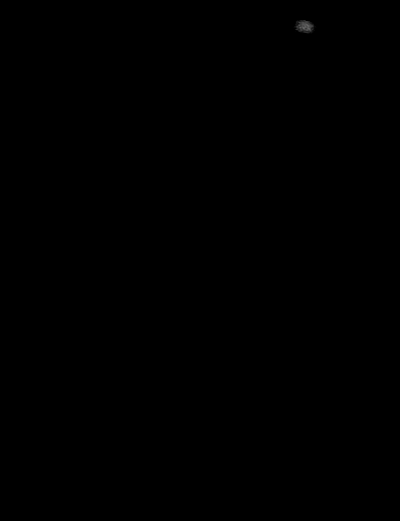

[Series 7: STIR · coronal · right · 5.0mm · 1.25mm/px · 2 of 36 slices shown (1 of 2)]
[im 1/36]
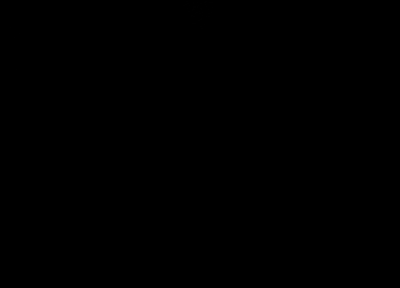
[im 36/36]
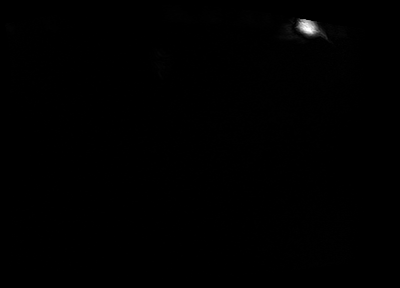

[Series 8: STIR · coronal · right · 5.0mm · 1.25mm/px · 2 of 36 slices shown (2 of 2)]
[im 1/36]
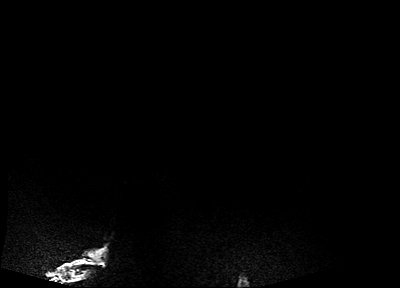
[im 36/36]
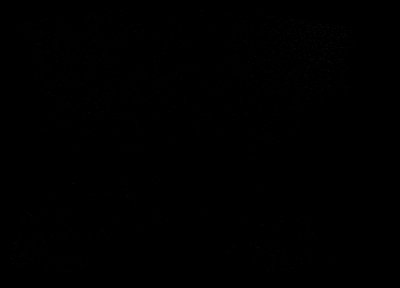

[Series 9: composed cor stir_comp · coronal · right · 6.0mm · 1.25mm/px · 2 of 35 slices shown]
[im 1/35]
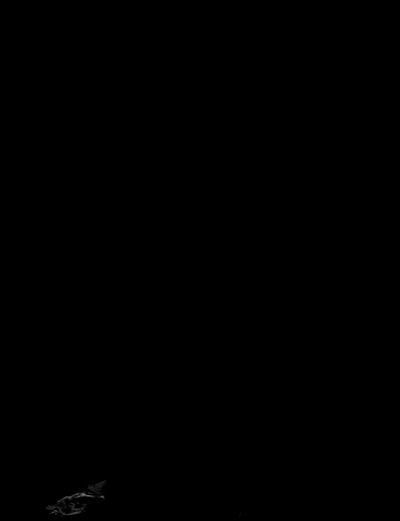
[im 35/35]
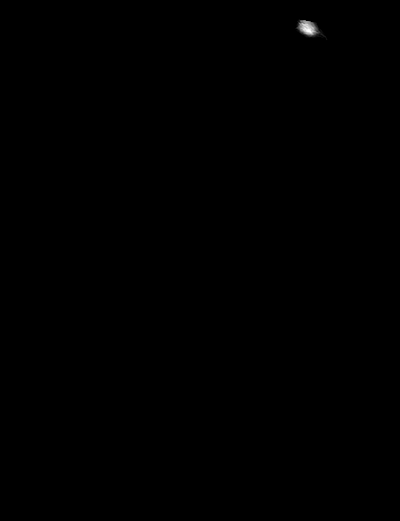

[Series 10: composed cor stir_comp_filt · coronal · right · 6.0mm · 1.25mm/px · 2 of 35 slices shown]
[im 1/35]
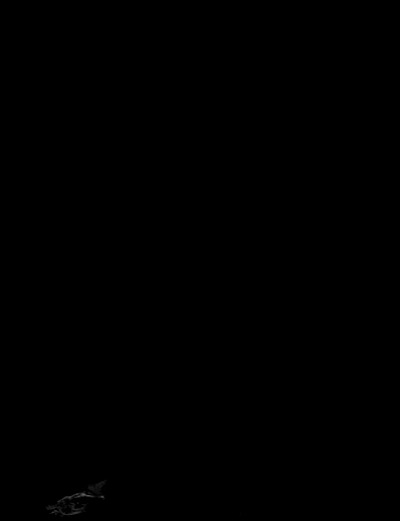
[im 35/35]
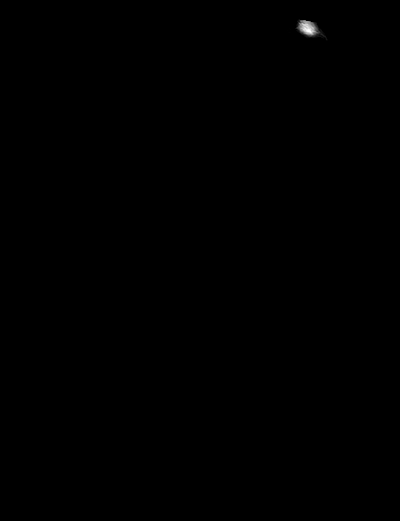

[Series 11: T1 · axial · right · 5.0mm · 0.78mm/px · z∈[-41,+169]mm · 2 of 36 slices shown (3 of 4)]
[im 1/36]
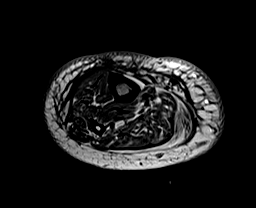
[im 36/36]
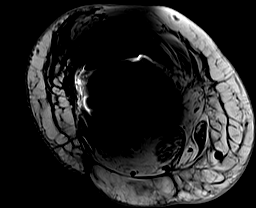

[Series 12: T1 · axial · right · 5.0mm · 0.78mm/px · z∈[-252,-42]mm · 2 of 36 slices shown (4 of 4)]
[im 1/36]
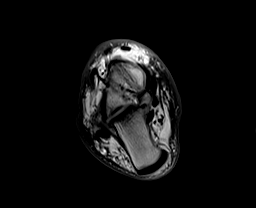
[im 36/36]
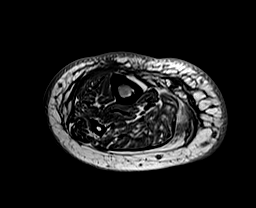

[Series 13: ax t1_comp · axial · right · 5.0mm · 0.78mm/px · z∈[-252,+169]mm · 4 of 72 slices shown]
[im 1/72]
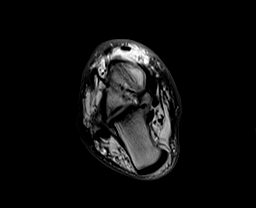
[im 24/72]
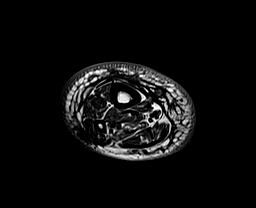
[im 48/72]
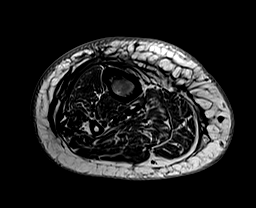
[im 72/72]
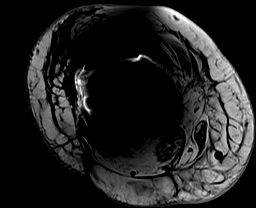

[Series 14: T2 fat-sat · axial · right · 5.0mm · 0.96mm/px · z∈[-41,+169]mm · 2 of 36 slices shown (1 of 4)]
[im 1/36]
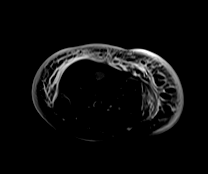
[im 36/36]
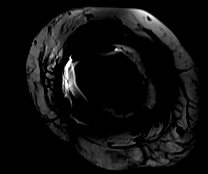

[Series 15: T2 fat-sat · axial · right · 5.0mm · 0.96mm/px · z∈[-252,-42]mm · 2 of 36 slices shown (2 of 4)]
[im 1/36]
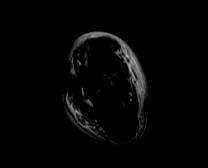
[im 36/36]
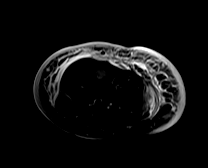

[Series 16: T2 · axial · right · 5.0mm · 0.96mm/px · z∈[-41,+169]mm · 2 of 36 slices shown (1 of 3)]
[im 1/36]
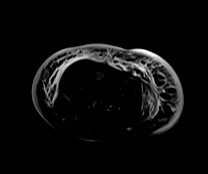
[im 36/36]
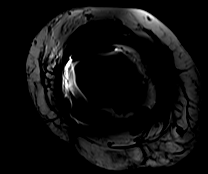

[Series 16: T2 · axial · right · 5.0mm · 0.96mm/px · z∈[-252,-42]mm · 2 of 36 slices shown (2 of 3)]
[im 1/36]
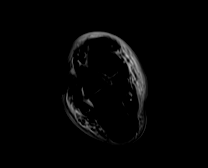
[im 36/36]
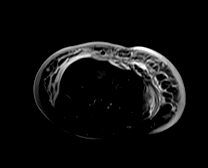

[Series 17: T2 fat-sat · sagittal · right · 4.0mm · 1.15mm/px · 2 of 36 slices shown (3 of 4)]
[im 1/36]
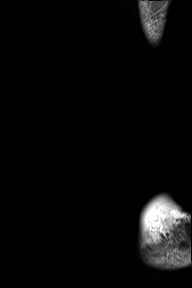
[im 36/36]
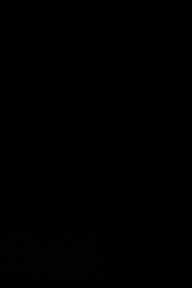

[Series 18: T2 fat-sat · sagittal · right · 4.0mm · 1.15mm/px · 2 of 37 slices shown (4 of 4)]
[im 1/37]
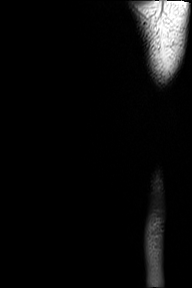
[im 37/37]
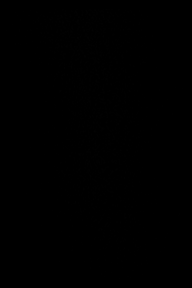

[Series 19: T2 · sagittal · right · 5.0mm · 1.15mm/px · 2 of 37 slices shown (3 of 3)]
[im 1/37]
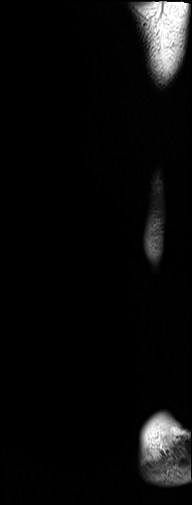
[im 37/37]
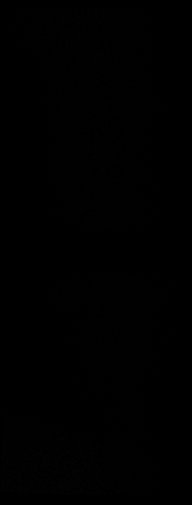

[40 of 40 positions shown; findings below may reference images not displayed]

FINDINGS: Bones/Joint/Cartilage

Magnetic susceptibility artifact is seen from total right knee
arthroplasty hardware.

Within the limitations of the metallic artifact and inhomogeneous
fat saturation no definite tibia or fibula bone marrow signal
abnormality is seen. No cortical destruction is seen.

Ligaments

On large field-of-view images, the left tibia and fibula are
included. The left knee medial collateral ligament and fibular
collateral ligament are grossly intact.

Muscles and Tendons

There is a fat intensity lipoma within the anterior aspect of the
distal right medial head of the gastrocnemius muscle measuring up to
1.7 x 2.5 x 5.4 cm (transverse by AP by craniocaudal), benign (axial
series 11, image 33 and coronal series 3, image 18). Mild feathery
fatty infiltration of the distal lateral aspect of the medial head
of the gastrocnemius muscle. Increased T2 signal is seen within the
proximal anterior compartment and deep posterior compartment on
axial T2 fat saturation images (series 14 images 15 through 21),
however this may be artifactual due to inhomogeneous fat saturation
as this is not seen on coronal STIR images.

No deep fascial plane fluid is seen.

Soft tissues

There is diffuse edema and swelling throughout the right lower
extremity subcutaneous fat, greatest within the proximal anterior
shin subcutaneous fat but also seen throughout the anterolateral and
anterior medial shin subcutaneous fat. On large field-of-view
coronal images of the left leg demonstrates more mild diffuse
subcutaneous fat edema predominantly within the distal 50% of the
left calf. No walled-off fluid collection.
IMPRESSION: 1. Redemonstration of diffuse anterolateral and anteromedial
subcutaneous edema and swelling of the right calf. Findings are
again compatible with cellulitis. Again no walled-off abscess or
deep fascial plane fluid is seen.
2. No acute bone abnormality.
3. Artifact from total right knee arthroplasty hardware.

## 2022-04-23 SURGERY — IRRIGATION AND DEBRIDEMENT EXTREMITY
Anesthesia: General | Laterality: Right

## 2022-04-23 MED ORDER — FENTANYL CITRATE (PF) 100 MCG/2ML IJ SOLN
INTRAMUSCULAR | Status: AC
  Filled 2022-04-23: qty 2

## 2022-04-23 MED ORDER — PENICILLIN G POTASSIUM 20000000 UNITS IJ SOLR
12.0000 10*6.[IU] | Freq: Two times a day (BID) | INTRAVENOUS | Status: DC
  Administered 2022-04-23 – 2022-04-27 (×8): 12 10*6.[IU] via INTRAVENOUS
  Filled 2022-04-23 (×7): qty 12
  Filled 2022-04-23: qty 10
  Filled 2022-04-23 (×2): qty 12

## 2022-04-23 MED ORDER — MUPIROCIN 2 % EX OINT
TOPICAL_OINTMENT | CUTANEOUS | Status: AC
  Filled 2022-04-23: qty 22

## 2022-04-23 MED ORDER — ACETAMINOPHEN 500 MG PO TABS: 1000.0000 mg | ORAL_TABLET | Freq: Once | ORAL | Status: DC

## 2022-04-23 MED ORDER — ACETAMINOPHEN 10 MG/ML IV SOLN
INTRAVENOUS | Status: AC
  Filled 2022-04-23: qty 100

## 2022-04-23 MED ORDER — PHENYLEPHRINE 80 MCG/ML (10ML) SYRINGE FOR IV PUSH (FOR BLOOD PRESSURE SUPPORT)
PREFILLED_SYRINGE | INTRAVENOUS | Status: DC | PRN
  Administered 2022-04-23 (×2): 160 ug via INTRAVENOUS

## 2022-04-23 MED ORDER — LINEZOLID 600 MG PO TABS
600.0000 mg | ORAL_TABLET | Freq: Two times a day (BID) | ORAL | Status: DC
  Administered 2022-04-23 – 2022-04-24 (×2): 600 mg via ORAL
  Filled 2022-04-23 (×3): qty 1

## 2022-04-23 MED ORDER — KCL IN DEXTROSE-NACL 10-5-0.45 MEQ/L-%-% IV SOLN
INTRAVENOUS | Status: DC
  Filled 2022-04-23: qty 1000

## 2022-04-23 MED ORDER — ACETAMINOPHEN 10 MG/ML IV SOLN
INTRAVENOUS | Status: DC | PRN
  Administered 2022-04-23: 1000 mg via INTRAVENOUS

## 2022-04-23 MED ORDER — FENTANYL CITRATE (PF) 250 MCG/5ML IJ SOLN
INTRAMUSCULAR | Status: AC
  Filled 2022-04-23: qty 5

## 2022-04-23 MED ORDER — PROPOFOL 10 MG/ML IV BOLUS
INTRAVENOUS | Status: DC | PRN
  Administered 2022-04-23: 100 mg via INTRAVENOUS

## 2022-04-23 MED ORDER — 0.9 % SODIUM CHLORIDE (POUR BTL) OPTIME
TOPICAL | Status: DC | PRN
  Administered 2022-04-23: 1000 mL

## 2022-04-23 MED ORDER — CHLORHEXIDINE GLUCONATE 0.12 % MT SOLN
OROMUCOSAL | Status: AC
  Administered 2022-04-23: 15 mL
  Filled 2022-04-23: qty 15

## 2022-04-23 MED ORDER — CHLORHEXIDINE GLUCONATE 0.12 % MT SOLN: 15.0000 mL | Freq: Once | OROMUCOSAL | Status: AC

## 2022-04-23 MED ORDER — PROPOFOL 10 MG/ML IV BOLUS
INTRAVENOUS | Status: AC
  Filled 2022-04-23: qty 20

## 2022-04-23 MED ORDER — LIDOCAINE 2% (20 MG/ML) 5 ML SYRINGE
INTRAMUSCULAR | Status: DC | PRN
  Administered 2022-04-23: 100 mg via INTRAVENOUS

## 2022-04-23 MED ORDER — ONDANSETRON HCL 4 MG/2ML IJ SOLN
INTRAMUSCULAR | Status: AC
  Filled 2022-04-23: qty 2

## 2022-04-23 MED ORDER — ORAL CARE MOUTH RINSE: 15.0000 mL | Freq: Once | OROMUCOSAL | Status: AC

## 2022-04-23 MED ORDER — ONDANSETRON HCL 4 MG/2ML IJ SOLN
INTRAMUSCULAR | Status: DC | PRN
  Administered 2022-04-23: 4 mg via INTRAVENOUS

## 2022-04-23 MED ORDER — LACTATED RINGERS IV SOLN: INTRAVENOUS | Status: DC

## 2022-04-23 MED ORDER — FENTANYL CITRATE (PF) 100 MCG/2ML IJ SOLN
INTRAMUSCULAR | Status: DC | PRN
  Administered 2022-04-23 (×2): 50 ug via INTRAVENOUS

## 2022-04-23 MED ORDER — CHLORHEXIDINE GLUCONATE 4 % EX LIQD
60.0000 mL | Freq: Once | CUTANEOUS | Status: AC
  Administered 2022-04-23: 4 via TOPICAL
  Filled 2022-04-23: qty 60

## 2022-04-23 MED ORDER — POVIDONE-IODINE 10 % EX SWAB: Freq: Once | CUTANEOUS | Status: AC

## 2022-04-23 MED ORDER — FENTANYL CITRATE (PF) 100 MCG/2ML IJ SOLN
25.0000 ug | INTRAMUSCULAR | Status: DC | PRN
  Administered 2022-04-23: 25 ug via INTRAVENOUS
  Administered 2022-04-23: 50 ug via INTRAVENOUS
  Administered 2022-04-23: 25 ug via INTRAVENOUS

## 2022-04-23 SURGICAL SUPPLY — 34 items
BAG COUNTER SPONGE SURGICOUNT (BAG) IMPLANT
BLADE SURG 21 STRL SS (BLADE) ×2 IMPLANT
BNDG COHESIVE 4X5 TAN ST LF (GAUZE/BANDAGES/DRESSINGS) ×1 IMPLANT
BNDG COHESIVE 6X5 TAN STRL LF (GAUZE/BANDAGES/DRESSINGS) IMPLANT
BNDG GAUZE ELAST 4 BULKY (GAUZE/BANDAGES/DRESSINGS) ×4 IMPLANT
COVER SURGICAL LIGHT HANDLE (MISCELLANEOUS) ×4 IMPLANT
DRAPE DERMATAC (DRAPES) ×4 IMPLANT
DRAPE U-SHAPE 47X51 STRL (DRAPES) ×2 IMPLANT
DRSG ADAPTIC 3X8 NADH LF (GAUZE/BANDAGES/DRESSINGS) ×2 IMPLANT
DURAPREP 26ML APPLICATOR (WOUND CARE) ×2 IMPLANT
ELECT REM PT RETURN 9FT ADLT (ELECTROSURGICAL)
ELECTRODE REM PT RTRN 9FT ADLT (ELECTROSURGICAL) IMPLANT
GAUZE SPONGE 4X4 12PLY STRL (GAUZE/BANDAGES/DRESSINGS) ×2 IMPLANT
GLOVE BIOGEL PI IND STRL 9 (GLOVE) ×1 IMPLANT
GLOVE BIOGEL PI INDICATOR 9 (GLOVE) ×1
GLOVE SURG ORTHO 9.0 STRL STRW (GLOVE) ×2 IMPLANT
GOWN STRL REUS W/ TWL XL LVL3 (GOWN DISPOSABLE) ×2 IMPLANT
GOWN STRL REUS W/TWL XL LVL3 (GOWN DISPOSABLE) ×4
HANDPIECE INTERPULSE COAX TIP (DISPOSABLE)
KIT BASIN OR (CUSTOM PROCEDURE TRAY) ×2 IMPLANT
KIT TURNOVER KIT B (KITS) ×2 IMPLANT
MANIFOLD NEPTUNE II (INSTRUMENTS) ×2 IMPLANT
NS IRRIG 1000ML POUR BTL (IV SOLUTION) ×2 IMPLANT
PACK ORTHO EXTREMITY (CUSTOM PROCEDURE TRAY) ×2 IMPLANT
PAD ARMBOARD 7.5X6 YLW CONV (MISCELLANEOUS) ×4 IMPLANT
PREVENA RESTOR AXIOFORM 29X28 (GAUZE/BANDAGES/DRESSINGS) ×1 IMPLANT
SET HNDPC FAN SPRY TIP SCT (DISPOSABLE) IMPLANT
STOCKINETTE IMPERVIOUS 9X36 MD (GAUZE/BANDAGES/DRESSINGS) IMPLANT
SUT ETHILON 2 0 PSLX (SUTURE) ×2 IMPLANT
SWAB COLLECTION DEVICE MRSA (MISCELLANEOUS) ×2 IMPLANT
SWAB CULTURE ESWAB REG 1ML (MISCELLANEOUS) IMPLANT
TOWEL GREEN STERILE (TOWEL DISPOSABLE) ×2 IMPLANT
TUBE CONNECTING 12X1/4 (SUCTIONS) ×2 IMPLANT
YANKAUER SUCT BULB TIP NO VENT (SUCTIONS) ×2 IMPLANT

## 2022-04-23 NOTE — Op Note (Addendum)
04/23/2022  2:31 PM  PATIENT:  Kathleen Wiley    PRE-OPERATIVE DIAGNOSIS:  Abscess Right Leg  POST-OPERATIVE DIAGNOSIS:  Same  PROCEDURE:  RIGHT LEG DEBRIDEMENT 4 compartment fasciotomy. Fluid sent from the medial and lateral compartments separately. Application of Prevena axial form wound VAC.   SURGEON:  Newt Minion, MD  PHYSICIAN ASSISTANT:None ANESTHESIA:   General  PREOPERATIVE INDICATIONS:  Kathleen Wiley is a  76 y.o. female with a diagnosis of Abscess Right Leg who failed conservative measures and elected for surgical management.    The risks benefits and alternatives were discussed with the patient preoperatively including but not limited to the risks of infection, bleeding, nerve injury, cardiopulmonary complications, the need for revision surgery, among others, and the patient was willing to proceed.  OPERATIVE IMPLANTS: Praveena axial form wound VAC  '@ENCIMAGES'$ @  OPERATIVE FINDINGS: There was fluid in the compartments medially and laterally this was sent for cultures separately.  OPERATIVE PROCEDURE: Patient was brought the operating room and underwent a general anesthetic.  After adequate levels anesthesia were obtained patient's right lower extremity was prepped using DuraPrep draped into a sterile field a timeout was called.  A longitudinal incision was first made over the medial aspect of the calf this was carried down to the fascia the fascia was incised.  There was no purulence but there was dark serosanguineous fluid.  The wound was irrigated with normal saline tissue was sent for cultures and the incision was loosely closed with 2-0 nylon.  A separate incision was then made over the anterior lateral compartments longitudinally laterally.  This was carried down the fascia the fascia was incised for the anterior lateral compartments.  Fluid was sent for cultures with soft tissue.  There is no clinical signs of necrotizing fasciitis.  Both wounds were irrigated  normal saline loosely closed with 2-0 nylon axial form was applied covered with Coban this had a good suction fit patient was taken the PACU in stable condition.  Debridement type: Excisional Debridement  Side: right  Body Location: leg   Tools used for debridement: scalpel and rongeur  Pre-debridement Wound size (cm):   Length: 1        Width: 1     Depth: 1   Post-debridement Wound size (cm):   Length: 20        Width: 3     Depth: 5   Debridement depth beyond dead/damaged tissue down to healthy viable tissue: yes  Tissue layer involved: skin, subcutaneous tissue, muscle / fascia  Nature of tissue removed: Devitalized Tissue and Non-viable tissue  Irrigation volume: 1 liter     Irrigation fluid type: Normal Saline    DISCHARGE PLANNING:  Antibiotic duration: Continue IV antibiotics  Weightbearing: Weightbearing as tolerated  Pain medication: Opioid pathway  Dressing care/ Wound VAC: Continue wound VAC for 1 week  Ambulatory devices: Walker  Discharge to: Anticipate discharge to home  Follow-up: In the office 1 week post operative.

## 2022-04-23 NOTE — Transfer of Care (Signed)
Immediate Anesthesia Transfer of Care Note  Patient: Kathleen Wiley  Procedure(s) Performed: RIGHT LEG DEBRIDEMENT (Right)  Patient Location: PACU  Anesthesia Type:General  Level of Consciousness: drowsy and patient cooperative  Airway & Oxygen Therapy: Patient Spontanous Breathing and Patient connected to face mask oxygen  Post-op Assessment: Report given to RN and Post -op Vital signs reviewed and stable  Post vital signs: Reviewed and stable  Last Vitals:  Vitals Value Taken Time  BP 121/71 04/23/22 1429  Temp    Pulse 76 04/23/22 1430  Resp 13 04/23/22 1430  SpO2 100 % 04/23/22 1430  Vitals shown include unvalidated device data.  Last Pain:  Vitals:   04/23/22 1334  TempSrc:   PainSc: 5       Patients Stated Pain Goal: 3 (95/18/84 1660)  Complications: No notable events documented.

## 2022-04-23 NOTE — Anesthesia Preprocedure Evaluation (Addendum)
Anesthesia Evaluation  Patient identified by MRN, date of birth, ID band Patient awake    Reviewed: Allergy & Precautions, NPO status , Patient's Chart, lab work & pertinent test results  History of Anesthesia Complications (+) PONV and history of anesthetic complications  Airway Mallampati: II  TM Distance: >3 FB Neck ROM: Full    Dental no notable dental hx.    Pulmonary sleep apnea , former smoker,    Pulmonary exam normal breath sounds clear to auscultation       Cardiovascular hypertension, + Peripheral Vascular Disease  Normal cardiovascular exam+ dysrhythmias (in setting of flagyl ) Atrial Fibrillation  Rhythm:Regular Rate:Normal  TTE 2023 1. Left ventricular ejection fraction, by estimation, is 60 to 65%. The  left ventricle has normal function. The left ventricle has no regional  wall motion abnormalities. There is severe concentric left ventricular  hypertrophy. Left ventricular diastolic  parameters are indeterminate.  2. Right ventricular systolic function is normal. The right ventricular  size is normal.  3. Left atrial size was moderately dilated.  4. The mitral valve is normal in structure. No evidence of mitral valve  regurgitation. No evidence of mitral stenosis.  5. The aortic valve is tricuspid. There is moderate calcification of the  aortic valve. There is moderate thickening of the aortic valve. Aortic  valve regurgitation is not visualized. Aortic valve  sclerosis/calcification is present, without any evidence  of aortic stenosis.  6. There is moderate dilatation of the aortic root, measuring 42 mm.  There is moderate dilatation of the ascending aorta, measuring 42 mm.  7. The inferior vena cava is normal in size with greater than 50%  respiratory variability, suggesting right atrial pressure of 3 mmHg.   Stress test 2023 .  Lexiscan stress is electrically negative for ischemia .  Myoview scan  shows normal perfusion.  No ischemia or scar. Marland Kitchen  LVEF is 53%  with normal wall motion .  OVerall low risk study.    Neuro/Psych PSYCHIATRIC DISORDERS Anxiety Depression negative neurological ROS     GI/Hepatic Neg liver ROS, GERD  ,  Endo/Other  diabetesMorbid obesity (BMI 40)  Renal/GU Renal InsufficiencyRenal diseaseLab Results      Component                Value               Date                      CREATININE               0.83                04/23/2022                BUN                      15                  04/23/2022                NA                       135                 04/23/2022                K  3.9                 04/23/2022                CL                       103                 04/23/2022                CO2                      23                  04/23/2022             negative genitourinary   Musculoskeletal negative musculoskeletal ROS (+)   Abdominal   Peds  Hematology  (+) REFUSES BLOOD PRODUCTS (will accept albumin), JEHOVAH'S WITNESSLab Results      Component                Value               Date                      WBC                      2.5 (L)             04/23/2022                HGB                      9.1 (L)             04/23/2022                HCT                      27.3 (L)            04/23/2022                MCV                      98.6                04/23/2022                PLT                      58 (L)              04/23/2022              Anesthesia Other Findings   Reproductive/Obstetrics                           Anesthesia Physical Anesthesia Plan  ASA: 3  Anesthesia Plan: General   Post-op Pain Management: Tylenol PO (pre-op)*   Induction: Intravenous  PONV Risk Score and Plan: 4 or greater and Ondansetron, Dexamethasone and Midazolam  Airway Management Planned: LMA  Additional Equipment:   Intra-op Plan:   Post-operative Plan: Extubation in  OR  Informed Consent: I have reviewed the patients History and Physical, chart, labs and discussed the procedure including the risks, benefits and alternatives  for the proposed anesthesia with the patient or authorized representative who has indicated his/her understanding and acceptance.     Dental advisory given  Plan Discussed with: CRNA  Anesthesia Plan Comments:         Anesthesia Quick Evaluation

## 2022-04-23 NOTE — Progress Notes (Signed)
PT Cancellation Note  Patient Details Name: SHANELE NISSAN MRN: [id number] DOB: 1946/04/26   Cancelled Treatment:    Reason Eval/Treat Not Completed: (P) Patient at procedure or test/unavailable (at OR for debridement.) Will continue efforts per PT plan of care as schedule permits.   Jariah Tarkowski M Richanda Darin 04/23/2022, 2:09 PM

## 2022-04-23 NOTE — Anesthesia Procedure Notes (Signed)
Procedure Name: LMA Insertion Date/Time: 04/23/2022 1:51 PM  Performed by: Gwyndolyn Saxon, CRNAPre-anesthesia Checklist: Patient identified, Emergency Drugs available, Suction available and Patient being monitored Patient Re-evaluated:Patient Re-evaluated prior to induction Oxygen Delivery Method: Circle system utilized Preoxygenation: Pre-oxygenation with 100% oxygen Induction Type: IV induction Ventilation: Mask ventilation without difficulty LMA: LMA inserted LMA Size: 4.0 Number of attempts: 1 Placement Confirmation: positive ETCO2 and breath sounds checked- equal and bilateral Tube secured with: Tape Dental Injury: Teeth and Oropharynx as per pre-operative assessment

## 2022-04-23 NOTE — Consult Note (Signed)
ORTHOPAEDIC CONSULTATION  REQUESTING PHYSICIAN: Thurnell Lose, MD  Chief Complaint: Painful cellulitis dermatitis swelling right leg.  HPI: Kathleen Wiley is a 76 y.o. female who presents with history of venous insufficiency who has developed acute redness cellulitis pain and swelling of the right leg.  Despite aggressive IV antibiotics she still has persistent pain and swelling and cellulitis.  Past Medical History:  Diagnosis Date   Anxiety    Arthritis    knee, hip   Cellulitis 08/2016   left leg   Complication of anesthesia    Diabetes mellitus without complication (HCC)    GERD (gastroesophageal reflux disease)    Heart murmur    "slight "  informed years ago.-    Hernia, umbilical    History of atrial fibrillation 2007   with flagyl   History of endometriosis    Hypercholesteremia    Hypertension    has not been to a cardiologist   PONV (postoperative nausea and vomiting)    Rectal vaginal fistula    Refusal of blood transfusions as patient is Jehovah's Witness    Sleep apnea    Past Surgical History:  Procedure Laterality Date   APPENDECTOMY     BUNIONECTOMY Right 01/09/2019   Procedure: RIGHT FOOT BUNION CORRECTION;  Surgeon: Erle Crocker, MD;  Location: [place];  Service: Orthopedics;  Laterality: Right;   CHOLECYSTECTOMY N/A 03/16/2021   Procedure: LAPAROSCOPIC CHOLECYSTECTOMY;  Surgeon: Stark Klein, MD;  Location: [facility];  Service: General;  Laterality: N/A;   ENDOSCOPIC RETROGRADE CHOLANGIOPANCREATOGRAPHY (ERCP) WITH PROPOFOL N/A 03/15/2021   Procedure: ENDOSCOPIC RETROGRADE CHOLANGIOPANCREATOGRAPHY (ERCP) WITH PROPOFOL;  Surgeon: Ronnette Juniper, MD;  Location: [facility];  Service: Gastroenterology;  Laterality: N/A;   EXPLORATORY LAPAROTOMY  1973   for endometrosis   HAMMERTOE RECONSTRUCTION WITH WEIL OSTEOTOMY Right 01/09/2019   Procedure: RIGHT FOOT 2-4 HAMMERTOE CORRECTION WITH WEIL OSTEOTOMY 2ND METATARSAL, DEEP TENDON TRANSFER WITH AKIN  OSTEOTOMY;  Surgeon: Erle Crocker, MD;  Location: [place];  Service: Orthopedics;  Laterality: Right;   I & D EXTREMITY Right 06/10/2016   Procedure: IRRIGATION AND DEBRIDEMENT THUMB FLEXOR SHEATH;  Surgeon: Leanora Cover, MD;  Location: [place];  Service: Orthopedics;  Laterality: Right;   JOINT REPLACEMENT     Left Hip   MANDIBLE SURGERY  1978   rectovaginal fistula repair  1980   REMOVAL OF STONES  03/15/2021   Procedure: REMOVAL OF STONES;  Surgeon: Ronnette Juniper, MD;  Location: [facility];  Service: Gastroenterology;;   Joan Mayans  03/15/2021   Procedure: Joan Mayans;  Surgeon: Ronnette Juniper, MD;  Location: [facility];  Service: Gastroenterology;;   TOTAL HIP ARTHROPLASTY  05/08/2012   Procedure: TOTAL HIP ARTHROPLASTY;  Surgeon: Kerin Salen, MD;  Location: [place];  Service: Orthopedics;  Laterality: Left;   TOTAL HIP ARTHROPLASTY Right 09/18/2018   Procedure: TOTAL HIP ARTHROPLASTY ANTERIOR APPROACH;  Surgeon: Frederik Pear, MD;  Location: [facility];  Service: Orthopedics;  Laterality: Right;   TOTAL KNEE ARTHROPLASTY Right 01/24/2013   Dr Mayer Camel   TOTAL KNEE ARTHROPLASTY Right 01/24/2013   Procedure: RIGHT TOTAL KNEE ARTHROPLASTY;  Surgeon: Kerin Salen, MD;  Location: [place];  Service: Orthopedics;  Laterality: Right;   Social History   Socioeconomic History   Marital status: Married    Spouse name: Not on file   Number of children: 1   Years of education: Not on file   Highest education level: Not on file  Occupational History  Not on file  Tobacco Use   Smoking status: Former    Packs/day: 1.00    Years: 10.00    Total pack years: 10.00    Types: Cigarettes    Quit date: 11/08/1981    Years since quitting: 40.4   Smokeless tobacco: Never  Vaping Use   Vaping Use: Never used  Substance and Sexual Activity   Alcohol use: No   Drug use: No   Sexual activity: Not on file  Other Topics Concern   Not on file  Social History Narrative   Not on file   Social Determinants  of Health   Financial Resource Strain: Not on file  Food Insecurity: Not on file  Transportation Needs: Not on file  Physical Activity: Not on file  Stress: Not on file  Social Connections: Not on file   Family History  Problem Relation Age of Onset   Dementia Mother    Cancer Father    Breast cancer Neg Hx    - negative except otherwise stated in the family history section Allergies  Allergen Reactions   Other     Refuses whole blood but does accept albumin   Atorvastatin Other (See Comments)   Metronidazole Other (See Comments)    Chest pain, A-fib    Septra [Sulfamethoxazole-Trimethoprim] Other (See Comments)    Blisters    Prior to Admission medications   Medication Sig Start Date End Date Taking? Authorizing Provider  acetaminophen (TYLENOL) 325 MG tablet Take 2 tablets (650 mg total) by mouth every 6 (six) hours as needed. Patient taking differently: Take 650 mg by mouth every 6 (six) hours as needed for mild pain. 03/17/21  Yes Saverio Danker, PA-C  Cholecalciferol (VITAMIN D) 50 MCG (2000 UT) tablet Take 2,000 Units by mouth daily.    Yes [provider]  cyanocobalamin 1000 MCG tablet Take 1,000 mcg by mouth daily.    Yes [provider]  escitalopram (LEXAPRO) 20 MG tablet Take 20 mg by mouth daily.   Yes [provider]  fluticasone (FLONASE) 50 MCG/ACT nasal spray Place 2 sprays into both nostrils daily as needed for allergies.  07/02/19  Yes [provider]  furosemide (LASIX) 20 MG tablet Take 20 mg by mouth daily. 09/23/19  Yes [provider]  gabapentin (NEURONTIN) 300 MG capsule Take 300 mg by mouth 2 (two) times daily. 07/25/19  Yes [provider]  Insulin Pen Needle 32G X 6 MM MISC Inject into the skin daily. 07/18/20  Yes [provider]  metoprolol succinate (TOPROL XL) 25 MG 24 hr tablet Take 1 tablet (25 mg total) by mouth daily. 12/29/21  Yes Lelon Perla, MD  omeprazole (PRILOSEC) 20 MG  capsule Take 20 mg by mouth 2 (two) times daily before a meal.   Yes [provider]  OZEMPIC, 1 MG/DOSE, 4 MG/3ML SOPN Inject 2 mg into the skin every Friday. 06/19/21  Yes [provider]  potassium chloride (KLOR-CON) 10 MEQ tablet Take 10 mEq by mouth daily.   Yes [provider]  rosuvastatin (CRESTOR) 5 MG tablet Take 5 mg by mouth at bedtime.   Yes [provider]   MR TIBIA FIBULA RIGHT WO CONTRAST  Result Date: 04/23/2022 CLINICAL DATA:  Soft tissue infection suspected. Hospital admission 2 weeks ago for right leg cellulitis. Right-greater-than-left leg redness and pain. Sepsis. History of diabetes. EXAM: MRI OF LOWER RIGHT EXTREMITY WITHOUT CONTRAST TECHNIQUE: Multiplanar, multisequence MR imaging of the right tibia and fibula  was performed. No intravenous contrast was administered. COMPARISON:  Right tibia and fibula radiographs 04/19/2022; CT right tibia and fibula 04/19/2022 FINDINGS: Bones/Joint/Cartilage Magnetic susceptibility artifact is seen from total right knee arthroplasty hardware. Within the limitations of the metallic artifact and inhomogeneous fat saturation no definite tibia or fibula bone marrow signal abnormality is seen. No cortical destruction is seen. Ligaments On large field-of-view images, the left tibia and fibula are included. The left knee medial collateral ligament and fibular collateral ligament are grossly intact. Muscles and Tendons There is a fat intensity lipoma within the anterior aspect of the distal right medial head of the gastrocnemius muscle measuring up to 1.7 x 2.5 x 5.4 cm (transverse by AP by craniocaudal), benign (axial series 11, image 33 and coronal series 3, image 18). Mild feathery fatty infiltration of the distal lateral aspect of the medial head of the gastrocnemius muscle. Increased T2 signal is seen within the proximal anterior compartment and deep posterior compartment on axial T2 fat saturation images (series 14  images 15 through 21), however this may be artifactual due to inhomogeneous fat saturation as this is not seen on coronal STIR images. No deep fascial plane fluid is seen. Soft tissues There is diffuse edema and swelling throughout the right lower extremity subcutaneous fat, greatest within the proximal anterior shin subcutaneous fat but also seen throughout the anterolateral and anterior medial shin subcutaneous fat. On large field-of-view coronal images of the left leg demonstrates more mild diffuse subcutaneous fat edema predominantly within the distal 50% of the left calf. No walled-off fluid collection. IMPRESSION: 1. Redemonstration of diffuse anterolateral and anteromedial subcutaneous edema and swelling of the right calf. Findings are again compatible with cellulitis. Again no walled-off abscess or deep fascial plane fluid is seen. 2. No acute bone abnormality. 3. 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 Electronically Signed   By: Yvonne Kendall M.D.   On: 04/23/2022 09:06   ECHOCARDIOGRAM COMPLETE  Result Date: 04/22/2022    ECHOCARDIOGRAM REPORT   Patient Name:   LOVA URBIETA Date of Exam: 04/22/2022 Medical Rec #:  [id number]       Height:       64.0 in Accession #:    [id number]      Weight:       235.0 lb Date of Birth:  1946-09-28       BSA:          2.095 m Patient Age:    66 years        BP:           107/62 mmHg Patient Gender: F               HR:           82 bpm. Exam Location:  Inpatient Procedure: 2D Echo, Cardiac Doppler and Color Doppler Indications:    Bacteremia  History:        Patient has prior history of Echocardiogram examinations, most                 recent 08/24/2019. Arrythmias:Atrial Fibrillation.  Sonographer:    Clayton Lefort RDCS (AE) Referring Phys: [place]  Sonographer Comments: Suboptimal apical window. IMPRESSIONS  1. Left ventricular ejection fraction, by estimation, is 60 to 65%. The left ventricle has normal function. The left ventricle has no  regional wall motion abnormalities. There is severe concentric left ventricular hypertrophy. Left ventricular diastolic  parameters are indeterminate.  2. Right ventricular systolic function is  normal. The right ventricular size is normal.  3. Left atrial size was moderately dilated.  4. The mitral valve is normal in structure. No evidence of mitral valve regurgitation. No evidence of mitral stenosis.  5. The aortic valve is tricuspid. There is moderate calcification of the aortic valve. There is moderate thickening of the aortic valve. Aortic valve regurgitation is not visualized. Aortic valve sclerosis/calcification is present, without any evidence of aortic stenosis.  6. 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  7. The inferior vena cava is normal in size with greater than 50% respiratory variability, suggesting right atrial pressure of 3 mmHg. Conclusion(s)/Recommendation(s): No evidence of valvular vegetations on this transthoracic echocardiogram. Consider a transesophageal echocardiogram to exclude infective endocarditis if clinically indicated. FINDINGS  Left Ventricle: Left ventricular ejection fraction, by estimation, is 60 to 65%. The left ventricle has normal function. The left ventricle has no regional wall motion abnormalities. The left ventricular internal cavity size was normal in size. There is  severe concentric left ventricular hypertrophy. Left ventricular diastolic parameters are indeterminate. Right Ventricle: The right ventricular size is normal. No increase in right ventricular wall thickness. Right ventricular systolic function is normal. Left Atrium: Left atrial size was moderately dilated. Right Atrium: Right atrial size was normal in size. Pericardium: There is no evidence of pericardial effusion. Presence of epicardial fat layer. Mitral Valve: The mitral valve is normal in structure. Mild mitral  annular calcification. No evidence of mitral valve regurgitation. No evidence of mitral valve stenosis. Tricuspid Valve: The tricuspid valve is normal in structure. Tricuspid valve regurgitation is trivial. No evidence of tricuspid stenosis. Aortic Valve: The aortic valve is tricuspid. There is moderate calcification of the aortic valve. There is moderate thickening of the aortic valve. Aortic valve regurgitation is not visualized. Aortic valve sclerosis/calcification is present, without any  evidence of aortic stenosis. Aortic valve mean gradient measures 6.0 mmHg. Aortic valve peak gradient measures 10.9 mmHg. Aortic valve area, by VTI measures 2.32 cm. Pulmonic Valve: The pulmonic valve was normal in structure. Pulmonic valve regurgitation is not visualized. No evidence of pulmonic stenosis. 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 Venous: The inferior vena cava is normal in size with greater than 50% respiratory variability, suggesting right atrial pressure of 3 mmHg. IAS/Shunts: No atrial level shunt detected by color flow Doppler.  LEFT VENTRICLE PLAX 2D LVIDd:         3.70 cm   Diastology LVIDs:         1.80 cm   LV e' medial:    11.10 cm/s LV PW:         1.50 cm   LV E/e' medial:  11.1 LV IVS:        2.10 cm   LV e' lateral:   10.80 cm/s LVOT diam:     2.20 cm   LV E/e' lateral: 11.4 LV SV:         75 LV SV Index:   36 LVOT Area:     3.80 cm  RIGHT VENTRICLE             IVC RV Basal diam:  3.10 cm     IVC diam: 2.20 cm RV S prime:     19.90 cm/s TAPSE (M-mode): 2.4 cm LEFT ATRIUM  Index        RIGHT ATRIUM           Index LA diam:      3.30 cm 1.58 cm/m   RA Area:     25.00 cm LA Vol (A2C): 31.4 ml 14.99 ml/m  RA Volume:   83.90 ml  40.04 ml/m LA Vol (A4C): 47.2 ml 22.53 ml/m  AORTIC VALVE AV Area (Vmax):    1.98 cm AV Area (Vmean):   2.16 cm AV Area (VTI):     2.32 cm AV Vmax:            165.00 cm/s AV Vmean:          111.000 cm/s AV VTI:            0.323 m AV Peak Grad:      10.9 mmHg AV Mean Grad:      6.0 mmHg LVOT Vmax:         85.80 cm/s LVOT Vmean:        63.000 cm/s LVOT VTI:          0.197 m LVOT/AV VTI ratio: 0.61  AORTA Ao Root diam: 4.20 cm Ao Asc diam:  4.00 cm MITRAL VALVE MV Area (PHT): 5.06 cm     SHUNTS MV Decel Time: 150 msec     Systemic VTI:  0.20 m MV E velocity: 123.00 cm/s  Systemic Diam: 2.20 cm Kardie Tobb DO Electronically signed by Berniece Salines DO Signature Date/Time: 04/22/2022/6:02:16 PM    Final    - pertinent xrays, CT, MRI studies were reviewed and independently interpreted  Positive ROS: All other systems have been reviewed and were otherwise negative with the exception of those mentioned in the HPI and as above.  Physical Exam: General: Alert, no acute distress Psychiatric: Patient is competent for consent with normal mood and affect Lymphatic: No axillary or cervical lymphadenopathy Cardiovascular: No pedal edema Respiratory: No cyanosis, no use of accessory musculature GI: No organomegaly, abdomen is soft and non-tender    Images:  '@ENCIMAGES'$ @  Labs:  Lab Results  Component Value Date   HGBA1C 5.2 04/20/2022   HGBA1C 6.4 (H) 03/15/2021   HGBA1C 7.2 (H) 10/26/2019   CRP 0.7 10/30/2019   CRP 0.6 10/29/2019   CRP 1.0 (H) 10/28/2019   REPTSTATUS PENDING 04/21/2022   GRAMSTAIN  06/10/2016    ABUNDANT WBC PRESENT,BOTH PMN AND MONONUCLEAR MODERATE GRAM POSITIVE COCCI IN PAIRS    CULT  04/21/2022    NO GROWTH 2 DAYS Performed at [facility], [place] [address] [place], [place] [address]    LABORGA KLEBSIELLA PNEUMONIAE (A) 04/19/2022    Lab Results  Component Value Date   ALBUMIN 2.3 (L) 04/23/2022   ALBUMIN 2.5 (L) 04/22/2022   ALBUMIN 2.7 (L) 04/21/2022        Latest Ref Rng & Units 04/23/2022    1:08 AM 04/22/2022   12:41 AM 04/21/2022   12:56 AM  CBC EXTENDED  WBC 4.0 - 10.5 K/uL 2.5  2.4  2.6   RBC 3.87 - 5.11  MIL/uL 2.77  2.86    2.89  3.03   Hemoglobin 12.0 - 15.0 g/dL 9.1  9.6  10.0   HCT 36.0 - 46.0 % 27.3  28.7  29.6   Platelets 150 - 400 K/uL 58  53  60   NEUT# 1.7 - 7.7 K/uL 1.5  1.5    Lymph# 0.7 - 4.0 K/uL 0.6  0.5  Neurologic: Patient does not have protective sensation bilateral lower extremities.   MUSCULOSKELETAL:   Skin: Examination patient has venous swelling of the left leg but there is no cellulitis.  Examination the right leg shows intense cellulitis involving the entire right lower extremity with significant swelling weeping edema there is extreme tenderness to palpation  Review of the MRI scan shows significant cellulitis superficial to the fascia does not show any muscle involvement.  There is no loculated fluid collections.  White blood cell count 2.5 hemoglobin 9.1.  Albumin 2.3.  Urine cultures are showing Klebsiella and blood cultures are showing strep group G. Assessment: Assessment: Cellulitis pain and swelling right lower extremity.  Plan: Despite aggressive IV antibiotics patient is showing no improvement in cellulitis to the right lower extremity.  There are no isolated fluid collections but extensive cellulitis.  We will plan for medial lateral incision for debridement.  Anticipate application of a wound VAC dressing postoperatively.  Thank you for the consult and the opportunity to see Ms. Payton Emerald, MD [facility] [phone] 12:44 PM

## 2022-04-23 NOTE — Progress Notes (Signed)
Pt went down to MRI.

## 2022-04-23 NOTE — Progress Notes (Addendum)
[facility]  Date of Admission:  04/19/2022      Total days of antibiotics 4  PCN G   Ceftriaxone          ASSESSMENT: Kathleen Wiley is a 76 y.o. female with severe cellulitis of the RLE and secondary group g bacteremia. She has not improved much regarding clinical symptoms of leg. Afebrile (99.8) and back to baseline neutropenia ( ~2). Fluid blister seems to be forming on anterior lower shin. MRI reviewed - no organized fluid collections or myositis. Plan for operative debridement with Dr. Sharol Given this afternoon given ongoing severe presentation despite appropriate antibiotics.   Continue IV Penicillin G, will extend out linezolid another 48h. ?to consider prednisone for refractory pain / severe symptoms - will decide after OR findings. She is diabetic.   Thrombocytopenia likely reactive in setting of sepsis - continue to monitor closely on her short term linezolid. Would prefer not to use clinda in lieu of this for toxin mediation given higher rates of clindamycin resistant strep.     PLAN: PCN G infusion  Extend out linezolid x 48h Follow cbc closely to thrombocytopenia is not worsened Ensure diabetes under good control  Dr Linus Salmons available over the weekend to follow patient as well. -------- Elzie Rings. [facility] [phone]     Principal Problem:   Group G streptococcal infection Active Problems:   Cellulitis of right lower extremity   Essential hypertension, benign   Type 2 diabetes mellitus with hyperglycemia (HCC)   Sepsis (HCC)    [MAR Hold] acetaminophen  1,000 mg Oral Q8H   acetaminophen  1,000 mg Oral Once   chlorhexidine       [MAR Hold] escitalopram  20 mg Oral Daily   [MAR Hold] gabapentin  300 mg Oral TID   [MAR Hold] insulin aspart  0-9 Units Subcutaneous TID WC   mupirocin ointment       povidone-iodine   Topical Once   [MAR Hold] rosuvastatin  5 mg Oral Daily   [MAR  Hold] cyanocobalamin  1,000 mcg Oral Daily    SUBJECTIVE: Still with severe pain, worse on the underside of her calf. Very swollen and tender all over leg.    Review of Systems: Review of Systems  Constitutional:  Positive for malaise/fatigue. Negative for chills and fever.  Respiratory:  Negative for cough and shortness of breath.   Cardiovascular:  Positive for leg swelling. Negative for chest pain.  Genitourinary:  Negative for dysuria.  Musculoskeletal:  Positive for joint pain (Rt leg pain). Negative for myalgias.  Skin:  Positive for rash.  Neurological:  Negative for dizziness, focal weakness and headaches.     Allergies  Allergen Reactions   Other     Refuses whole blood but does accept albumin   Atorvastatin Other (See Comments)   Metronidazole Other (See Comments)    Chest pain, A-fib    Septra [Sulfamethoxazole-Trimethoprim] Other (See Comments)    Blisters     OBJECTIVE: Vitals:   04/23/22 0000 04/23/22 0732 04/23/22 1214 04/23/22 1324  BP: 121/88 122/75 130/74 132/78  Pulse: 86 (!) 147 78 82  Resp: '18 20 20 18  '$ Temp: 98.1 F (36.7 C) 98.7 F (37.1 C) 100 F (37.8 C) 99.8 F (37.7 C)  TempSrc: Oral Oral Oral Oral  SpO2: 94%  100% 97%  Weight:    106.6 kg  Height:    '5\' 4"'$  (  1.626 m)   Body mass index is 40.34 kg/m.  Physical Exam Vitals reviewed.  Constitutional:      Appearance: Normal appearance. She is not ill-appearing.  HENT:     Mouth/Throat:     Mouth: Mucous membranes are moist.     Pharynx: No oropharyngeal exudate.  Cardiovascular:     Rate and Rhythm: Normal rate and regular rhythm.     Heart sounds: No murmur heard. Pulmonary:     Effort: Pulmonary effort is normal.  Abdominal:     General: Bowel sounds are normal. There is no distension.     Palpations: Abdomen is soft.  Musculoskeletal:        General: Swelling and tenderness present.     Right lower leg: Edema present.  Skin:    General: Skin is warm and dry.      Capillary Refill: Capillary refill takes less than 2 seconds.     Findings: Erythema and rash present.     Comments: Exquisitely tender right lower leg with ongoing swelling. Venous pooling under leg with hemorrhagic areas scattered.   Neurological:     Mental Status: She is alert and oriented to person, place, and time.     Lab Results Lab Results  Component Value Date   WBC 2.5 (L) 04/23/2022   HGB 9.1 (L) 04/23/2022   HCT 27.3 (L) 04/23/2022   MCV 98.6 04/23/2022   PLT 58 (L) 04/23/2022    Lab Results  Component Value Date   CREATININE 0.83 04/23/2022   BUN 15 04/23/2022   NA 135 04/23/2022   K 3.9 04/23/2022   CL 103 04/23/2022   CO2 23 04/23/2022    Lab Results  Component Value Date   ALT 16 04/23/2022   AST 21 04/23/2022   ALKPHOS 64 04/23/2022   BILITOT 1.8 (H) 04/23/2022     Microbiology: Recent Results (from the past 240 hour(s))  Resp Panel by RT-PCR (Flu A&B, Covid) Anterior Nasal Swab     Status: None   Collection Time: 04/19/22  8:05 PM   Specimen: Anterior Nasal Swab  Result Value Ref Range Status   SARS Coronavirus 2 by RT PCR NEGATIVE NEGATIVE Final    Comment: (NOTE) SARS-CoV-2 target nucleic acids are NOT DETECTED.  The SARS-CoV-2 RNA is generally detectable in upper respiratory specimens during the acute phase of infection. The lowest concentration of SARS-CoV-2 viral copies this assay can detect is 138 copies/mL. A negative result does not preclude SARS-Cov-2 infection and should not be used as the sole basis for treatment or other patient management decisions. A negative result may occur with  improper specimen collection/handling, submission of specimen other than nasopharyngeal swab, presence of viral mutation(s) within the areas targeted by this assay, and inadequate number of viral copies(<138 copies/mL). A negative result must be combined with clinical observations, patient history, and epidemiological information. The expected result  is Negative.  Fact Sheet for Patients:  EntrepreneurPulse.com.au  Fact Sheet for Healthcare Providers:  IncredibleEmployment.be  This test is no t yet approved or cleared by the [place] FDA and  has been authorized for detection and/or diagnosis of SARS-CoV-2 by FDA under an Emergency Use Authorization (EUA). This EUA will remain  in effect (meaning this test can be used) for the duration of the COVID-19 declaration under Section 564(b)(1) of the Act, 21 U.S.C.section 360bbb-3(b)(1), unless the authorization is terminated  or revoked sooner.       Influenza A by PCR NEGATIVE NEGATIVE Final  Influenza B by PCR NEGATIVE NEGATIVE Final    Comment: (NOTE) The Xpert Xpress SARS-CoV-2/FLU/RSV plus assay is intended as an aid in the diagnosis of influenza from Nasopharyngeal swab specimens and should not be used as a sole basis for treatment. Nasal washings and aspirates are unacceptable for Xpert Xpress SARS-CoV-2/FLU/RSV testing.  Fact Sheet for Patients: EntrepreneurPulse.com.au  Fact Sheet for Healthcare Providers: IncredibleEmployment.be  This test is not yet approved or cleared by the [place] FDA and has been authorized for detection and/or diagnosis of SARS-CoV-2 by FDA under an Emergency Use Authorization (EUA). This EUA will remain in effect (meaning this test can be used) for the duration of the COVID-19 declaration under Section 564(b)(1) of the Act, 21 U.S.C. section 360bbb-3(b)(1), unless the authorization is terminated or revoked.  Performed at [facility], [place] [address], [place], [place] [address]   Blood Culture (routine x 2)     Status: None (Preliminary result)   Collection Time: 04/19/22  8:24 PM   Specimen: BLOOD  Result Value Ref Range Status   Specimen Description BLOOD RIGHT ANTECUBITAL  Final   Special Requests   Final    BOTTLES DRAWN AEROBIC AND ANAEROBIC  Blood Culture adequate volume   Culture   Final    NO GROWTH 4 DAYS Performed at [facility], [place] [address] [place], [place] [address]    Report Status PENDING  Incomplete  Blood Culture (routine x 2)     Status: Abnormal   Collection Time: 04/19/22  8:33 PM   Specimen: BLOOD  Result Value Ref Range Status   Specimen Description BLOOD RIGHT ANTECUBITAL  Final   Special Requests   Final    BOTTLES DRAWN AEROBIC AND ANAEROBIC Blood Culture adequate volume   Culture  Setup Time   Final    GRAM POSITIVE COCCI IN CHAINS IN BOTH AEROBIC AND ANAEROBIC BOTTLES CRITICAL RESULT CALLED TO, READ BACK BY AND VERIFIED WITH:  C/ ELIZABETH M., PHARMD 04/20/22 1254 A. LAFRANCE Performed at [facility], [place] [address] [place], [place] [address]    Culture STREPTOCOCCUS GROUP G (A)  Final   Report Status 04/22/2022 FINAL  Final   Organism ID, Bacteria STREPTOCOCCUS GROUP G  Final      Susceptibility   Streptococcus group g - MIC*    CLINDAMYCIN RESISTANT Resistant     AMPICILLIN <=0.25 SENSITIVE Sensitive     VANCOMYCIN 0.25 SENSITIVE Sensitive     CEFTRIAXONE <=0.12 SENSITIVE Sensitive     LEVOFLOXACIN 2 SENSITIVE Sensitive     * STREPTOCOCCUS GROUP G  Blood Culture ID Panel (Reflexed)     Status: Abnormal   Collection Time: 04/19/22  8:33 PM  Result Value Ref Range Status   Enterococcus faecalis NOT DETECTED NOT DETECTED Final   Enterococcus Faecium NOT DETECTED NOT DETECTED Final   Listeria monocytogenes NOT DETECTED NOT DETECTED Final   Staphylococcus species NOT DETECTED NOT DETECTED Final   Staphylococcus aureus (BCID) NOT DETECTED NOT DETECTED Final   Staphylococcus epidermidis NOT DETECTED NOT DETECTED Final   Staphylococcus lugdunensis NOT DETECTED NOT DETECTED Final   Streptococcus species DETECTED (A) NOT DETECTED Final    Comment: Not Enterococcus species, Streptococcus agalactiae, Streptococcus pyogenes, or Streptococcus pneumoniae. CRITICAL RESULT CALLED TO, READ  BACK BY AND VERIFIED WITH:  C/ ELIZABETH M., PHARMD 04/20/22 1254 A. LAFRANCE    Streptococcus agalactiae NOT DETECTED NOT DETECTED Final   Streptococcus pneumoniae NOT DETECTED NOT DETECTED Final   Streptococcus  pyogenes NOT DETECTED NOT DETECTED Final   A.calcoaceticus-baumannii NOT DETECTED NOT DETECTED Final   Bacteroides fragilis NOT DETECTED NOT DETECTED Final   Enterobacterales NOT DETECTED NOT DETECTED Final   Enterobacter cloacae complex NOT DETECTED NOT DETECTED Final   Escherichia coli NOT DETECTED NOT DETECTED Final   Klebsiella aerogenes NOT DETECTED NOT DETECTED Final   Klebsiella oxytoca NOT DETECTED NOT DETECTED Final   Klebsiella pneumoniae NOT DETECTED NOT DETECTED Final   Proteus species NOT DETECTED NOT DETECTED Final   Salmonella species NOT DETECTED NOT DETECTED Final   Serratia marcescens NOT DETECTED NOT DETECTED Final   Haemophilus influenzae NOT DETECTED NOT DETECTED Final   Neisseria meningitidis NOT DETECTED NOT DETECTED Final   Pseudomonas aeruginosa NOT DETECTED NOT DETECTED Final   Stenotrophomonas maltophilia NOT DETECTED NOT DETECTED Final   Candida albicans NOT DETECTED NOT DETECTED Final   Candida auris NOT DETECTED NOT DETECTED Final   Candida glabrata NOT DETECTED NOT DETECTED Final   Candida krusei NOT DETECTED NOT DETECTED Final   Candida parapsilosis NOT DETECTED NOT DETECTED Final   Candida tropicalis NOT DETECTED NOT DETECTED Final   Cryptococcus neoformans/gattii NOT DETECTED NOT DETECTED Final    Comment: Performed at [facility], [address], [place], [place] [address]  Urine Culture     Status: Abnormal   Collection Time: 04/19/22  9:23 PM   Specimen: In/Out Cath Urine  Result Value Ref Range Status   Specimen Description IN/OUT CATH URINE  Final   Special Requests   Final    NONE Performed at [facility], [place] [address] [place], [place] [address]    Culture >=100,000 COLONIES/mL KLEBSIELLA PNEUMONIAE (A)  Final    Report Status 04/22/2022 FINAL  Final   Organism ID, Bacteria KLEBSIELLA PNEUMONIAE (A)  Final      Susceptibility   Klebsiella pneumoniae - MIC*    AMPICILLIN RESISTANT Resistant     CEFAZOLIN <=4 SENSITIVE Sensitive     CEFEPIME <=0.12 SENSITIVE Sensitive     CEFTRIAXONE <=0.25 SENSITIVE Sensitive     CIPROFLOXACIN <=0.25 SENSITIVE Sensitive     GENTAMICIN <=1 SENSITIVE Sensitive     IMIPENEM 0.5 SENSITIVE Sensitive     NITROFURANTOIN 128 RESISTANT Resistant     TRIMETH/SULFA <=20 SENSITIVE Sensitive     AMPICILLIN/SULBACTAM <=2 SENSITIVE Sensitive     PIP/TAZO <=4 SENSITIVE Sensitive     * >=100,000 COLONIES/mL KLEBSIELLA PNEUMONIAE  Culture, blood (Routine X 2) w Reflex to ID Panel     Status: None (Preliminary result)   Collection Time: 04/21/22  1:56 PM   Specimen: BLOOD  Result Value Ref Range Status   Specimen Description BLOOD RIGHT ANTECUBITAL  Final   Special Requests   Final    BOTTLES DRAWN AEROBIC AND ANAEROBIC Blood Culture adequate volume   Culture   Final    NO GROWTH 2 DAYS Performed at [facility], [address], [place], [place] [address]    Report Status PENDING  Incomplete  Culture, blood (Routine X 2) w Reflex to ID Panel     Status: None (Preliminary result)   Collection Time: 04/21/22  1:57 PM   Specimen: BLOOD LEFT HAND  Result Value Ref Range Status   Specimen Description BLOOD LEFT HAND  Final   Special Requests   Final    BOTTLES DRAWN AEROBIC ONLY Blood Culture results may not be optimal due to an inadequate volume of blood received in culture bottles  Culture   Final    NO GROWTH 2 DAYS Performed at [facility], [place] [address] [place], [place] [address]    Report Status PENDING  Incomplete  MRSA Next Gen by PCR, Nasal     Status: None   Collection Time: 04/23/22  7:08 AM   Specimen: Nasal Mucosa; Nasal Swab  Result Value Ref Range Status   MRSA by PCR Next Gen NOT DETECTED NOT DETECTED Final    Comment: (NOTE) The GeneXpert  MRSA Assay (FDA approved for NASAL specimens only), is one component of a comprehensive MRSA colonization surveillance program. It is not intended to diagnose MRSA infection nor to guide or monitor treatment for MRSA infections. Test performance is not FDA approved in patients less than 81 years old. Performed at [facility], [place] [address] [place], [place] [address]      Janene Madeira, MSN, NP-C [facility] [email or web address] Pager: [phone] Office: [phone] [facility] Main Line: [place] Communication Welcome

## 2022-04-23 NOTE — Progress Notes (Signed)
Pt refuses all blood products but will accept albumin.  Pt on the way to the OR now, unable to fax a copy to blood bank currently.  Blood bank called to make aware of refusal.  Floor nurse Greggory Brandy, on 5W called to make aware, stated she will send copy to bloodbank once patient arrives back on floor.

## 2022-04-23 NOTE — Progress Notes (Signed)
PROGRESS NOTE        PATIENT DETAILS Name: Kathleen Wiley Age: 76 y.o. Sex: female Date of Birth: [birth year] Admit Date: 04/19/2022 Admitting Physician Evalee Mutton Kristeen Mans, MD FAO:[id number], Ivin Booty, MD  Brief Summary: Patient is a 76 y.o.  female with history of DM-2, HTN, HLD who presented with fever, vomiting, and RLE erythema swelling-found to have sepsis RLE cellulitis with streptococcal bacteremia.   Significant events: 6/12>> admit to [facility] for RLE swelling/cellulitis.  Significant studies: 6/12>> CT  right tibia/fibula: Diffuse subcutaneous edema/swelling-no evidence of soft tissue gas/abscess. 6/12>> CXR: No PNA 6/13>> CT head: No acute abnormalities 6/13>>RLE doppler-neg for DVT 6/15>> vitamin B12 level: Normal 6/15 >>   TTE -   1. Left ventricular ejection fraction, by estimation, is 60 to 65%. The left ventricle has normal function. The left ventricle has no regional wall motion abnormalities. There is severe concentric left ventricular hypertrophy. Left ventricular diastolic  parameters are indeterminate.  2. Right ventricular systolic function is normal. The right ventricular size is normal.  3. Left atrial size was moderately dilated.  4. The mitral valve is normal in structure. No evidence of mitral valve regurgitation. No evidence of mitral stenosis.  5. The aortic valve is tricuspid. There is moderate calcification of the aortic valve. There is moderate thickening of the aortic valve. Aortic valve regurgitation is not visualized. Aortic valve sclerosis/calcification is present, without any evidence of aortic stenosis.  6. 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  7. The inferior vena cava is normal in size with greater than 50% respiratory variability, suggesting right atrial pressure of 3 mmHg. Conclusion(s)/Recommendation(s): No evidence of valvular  vegetations on this transthoracic echocardiogram. Consider a transesophageal echocardiogram to exclude infective endocarditis if clinically indicated.  6/15 >> MRI    1. Redemonstration of diffuse anterolateral and anteromedial subcutaneous edema and swelling of the right calf. Findings are again compatible with cellulitis. Again no walled-off abscess or deep fascial plane fluid is seen. 2. No acute bone abnormality. 3. 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   Significant microbiology data: 6/12>> COVID/influenza PCR: Negative 6/12>> urine culture: Klebsiella (colonization) 6/12>> blood culture: 1/2-Streptococcus group G 6/14>> blood culture: No growth  Procedures:    Consults: ID, Ortho  Subjective:  Patient in bed, appears comfortable, denies any headache, no fever, no chest pain or pressure, no shortness of breath , no abdominal pain. No new focal weakness.  Will has some right lower extremity discomfort   Objective: Vitals: Blood pressure 130/74, pulse 78, temperature 100 F (37.8 C), temperature source Oral, resp. rate 20, height '5\' 4"'$  (1.626 m), weight 106.6 kg, SpO2 100 %.   Exam:  Awake Alert, No new F.N deficits, Normal affect .AT,PERRAL Supple Neck, No JVD,   Symmetrical Chest wall movement, Good air movement bilaterally, CTAB RRR,No Gallops, Rubs or new Murmurs,  +ve B.Sounds, Abd Soft, No tenderness,   Skin: Right lower extremity cellulitis on the day of admission below     Assessment/Plan:  Sepsis due to RLE cellulitis and streptococcal bacteremia: Although sepsis physiology better-no fever-BP stable-she continues to have persistent RLE pain and leukopenia/thrombocytopenia. ID following-on Rocephin/Zyvox-repeat cultures negative-unremarkable TTE.  CT and MRI of the right lower extremity failed to show any abscess however on exam the leg looks extremely  angry, still some areas of fluctuance, discussed the case with Dr. Sharol Given who will take her to  the OR on 04/23/2022 to see if there is any drainable fluid.  Monitor closely.  Sepsis pathophysiology has resolved.   Acute metabolic encephalopathy: Resolved-likely due to sepsis.  She continues to be completely awake and alert.  AKI: Improved-likely hemodynamically mediated.  Follow periodically.  Pancytopenia: Suspect due to sepsis/infection-B12 level stable-ferritin count significantly elevated-reflecting acute phase reactant.  Hold SQ heparin-continue to monitor CBC.  Recent PT/INR was negative.  No signs of bleeding.  Follow CBC-repeat INR tomorrow morning.  HTN: Apparently not on any antihypertensives at home-BP was soft yesterday-today more stable-watch closely.  HLD: Continue statin  Depression/anxiety: Resume Lexapro  DM-2 (A1c 5.2 on 6/30): Continue SSI-resume Ozempic on discharge.  Recent Labs    04/22/22 2030 04/23/22 0738 04/23/22 1215  GLUCAP 107* 103* 88     Morbid Obesity Estimated body mass index is 40.34 kg/m as calculated from the following:   Height as of this encounter: '5\' 4"'$  (1.626 m).   Weight as of this encounter: 106.6 kg.   Code status:   Code Status: Full Code   DVT Prophylaxis:    Family Communication: Spouse updated on 6/14-no family at bedside on 6/15.   Disposition Plan: Status is: Inpatient Remains inpatient appropriate because: Sepsis physiology due to RLE cellulitis-noted stable for discharge remains on IV antibiotics.  See above regarding plans to obtain MRI   Planned Discharge Destination:Home   Diet: Diet Order             Diet NPO time specified Except for: Sips with Meds  Diet effective ____                    MEDICATIONS: Scheduled Meds:  acetaminophen  1,000 mg Oral Q8H   escitalopram  20 mg Oral Daily   gabapentin  300 mg Oral TID   insulin aspart  0-9 Units Subcutaneous TID WC   povidone-iodine   Topical Once   rosuvastatin  5 mg Oral Daily   cyanocobalamin  1,000 mcg Oral Daily   Continuous Infusions:   dextrose 5 % and 0.45 % NaCl with KCl 10 mEq/L     penicillin G potassium 12 Million Units in dextrose 5 % 500 mL continuous infusion     PRN Meds:.morphine injection, ondansetron (ZOFRAN) IV, oxyCODONE   I have personally reviewed following labs and imaging studies  LABORATORY DATA:  Recent Labs  Lab 04/19/22 1730 04/20/22 0150 04/21/22 0056 04/22/22 0041 04/23/22 0108  WBC 4.1 4.3 2.6* 2.4* 2.5*  HGB 13.4 12.4 10.0* 9.6* 9.1*  HCT 40.4 37.8 29.6* 28.7* 27.3*  PLT PLATELET CLUMPS NOTED ON SMEAR, UNABLE TO ESTIMATE 78* 60* 53* 58*  MCV 100.7* 103.0* 97.7 99.3 98.6  MCH 33.4 33.8 33.0 33.2 32.9  MCHC 33.2 32.8 33.8 33.4 33.3  RDW 14.1 14.5 14.8 14.7 14.5  LYMPHSABS 0.3* 0.3*  --  0.5* 0.6*  MONOABS 0.2 0.3  --  0.2 0.2  EOSABS 0.0 0.0  --  0.0 0.1  BASOSABS 0.1 0.1  --  0.0 0.0    Recent Labs  Lab 04/19/22 1730 04/19/22 2033 04/19/22 2226 04/20/22 0150 04/20/22 0605 04/21/22 0056 04/22/22 0041 04/23/22 0108  NA 136  --   --  135  --  134* 136 135  K 3.7  --   --  3.8  --  3.4* 3.9 3.9  CL 102  --   --  99  --  102 105 103  CO2 23  --   --  23  --  '22 23 23  '$ GLUCOSE 177*  --   --  135*  --  104* 128* 116*  BUN 18  --   --  22  --  25* 19 15  CREATININE 1.05*  --   --  1.40*  --  1.11* 0.95 0.83  CALCIUM 9.3  --   --  8.8*  --  7.7* 7.9* 8.2*  AST 36  --   --  33  --  35 27 21  ALT 29  --   --  26  --  '22 21 16  '$ ALKPHOS 77  --   --  62  --  47 54 64  BILITOT 1.9*  --   --  2.0*  --  1.3* 1.6* 1.8*  ALBUMIN 4.0  --   --  3.4*  --  2.7* 2.5* 2.3*  MG  --   --   --   --   --   --  1.6* 2.2  LATICACIDVEN  --  5.2* 6.7* 4.2* 3.8*  --   --   --   INR  --  1.2  --   --   --   --   --  1.1  HGBA1C  --   --   --  5.2  --   --   --   --          RADIOLOGY STUDIES/RESULTS: MR TIBIA FIBULA RIGHT WO CONTRAST  Result Date: 04/23/2022 CLINICAL DATA:  Soft tissue infection suspected. Hospital admission 2 weeks ago for right leg cellulitis. Right-greater-than-left leg  redness and pain. Sepsis. History of diabetes. EXAM: MRI OF LOWER RIGHT EXTREMITY WITHOUT CONTRAST TECHNIQUE: Multiplanar, multisequence MR imaging of the right tibia and fibula was performed. No intravenous contrast was administered. COMPARISON:  Right tibia and fibula radiographs 04/19/2022; CT right tibia and fibula 04/19/2022 FINDINGS: Bones/Joint/Cartilage Magnetic susceptibility artifact is seen from total right knee arthroplasty hardware. Within the limitations of the metallic artifact and inhomogeneous fat saturation no definite tibia or fibula bone marrow signal abnormality is seen. No cortical destruction is seen. Ligaments On large field-of-view images, the left tibia and fibula are included. The left knee medial collateral ligament and fibular collateral ligament are grossly intact. Muscles and Tendons There is a fat intensity lipoma within the anterior aspect of the distal right medial head of the gastrocnemius muscle measuring up to 1.7 x 2.5 x 5.4 cm (transverse by AP by craniocaudal), benign (axial series 11, image 33 and coronal series 3, image 18). Mild feathery fatty infiltration of the distal lateral aspect of the medial head of the gastrocnemius muscle. Increased T2 signal is seen within the proximal anterior compartment and deep posterior compartment on axial T2 fat saturation images (series 14 images 15 through 21), however this may be artifactual due to inhomogeneous fat saturation as this is not seen on coronal STIR images. No deep fascial plane fluid is seen. Soft tissues There is diffuse edema and swelling throughout the right lower extremity subcutaneous fat, greatest within the proximal anterior shin subcutaneous fat but also seen throughout the anterolateral and anterior medial shin subcutaneous fat. On large field-of-view coronal images of the left leg demonstrates more mild diffuse subcutaneous fat edema predominantly within the distal 50% of the left calf. No walled-off fluid  collection. IMPRESSION: 1. Redemonstration of diffuse anterolateral and anteromedial subcutaneous edema and swelling of  the right calf. Findings are again compatible with cellulitis. Again no walled-off abscess or deep fascial plane fluid is seen. 2. No acute bone abnormality. 3. 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 Electronically Signed   By: Yvonne Kendall M.D.   On: 04/23/2022 09:06   ECHOCARDIOGRAM COMPLETE  Result Date: 04/22/2022    ECHOCARDIOGRAM REPORT   Patient Name:   Kathleen Wiley Date of Exam: 04/22/2022 Medical Rec #:  [id number]       Height:       64.0 in Accession #:    [id number]      Weight:       235.0 lb Date of Birth:  08-14-1946       BSA:          2.095 m Patient Age:    89 years        BP:           107/62 mmHg Patient Gender: F               HR:           82 bpm. Exam Location:  Inpatient Procedure: 2D Echo, Cardiac Doppler and Color Doppler Indications:    Bacteremia  History:        Patient has prior history of Echocardiogram examinations, most                 recent 08/24/2019. Arrythmias:Atrial Fibrillation.  Sonographer:    Clayton Lefort RDCS (AE) Referring Phys: [place]  Sonographer Comments: Suboptimal apical window. IMPRESSIONS  1. Left ventricular ejection fraction, by estimation, is 60 to 65%. The left ventricle has normal function. The left ventricle has no regional wall motion abnormalities. There is severe concentric left ventricular hypertrophy. Left ventricular diastolic  parameters are indeterminate.  2. Right ventricular systolic function is normal. The right ventricular size is normal.  3. Left atrial size was moderately dilated.  4. The mitral valve is normal in structure. No evidence of mitral valve regurgitation. No evidence of mitral stenosis.  5. The aortic valve is tricuspid. There is moderate calcification of the aortic valve. There is moderate thickening of the aortic valve. Aortic valve regurgitation is not visualized. Aortic valve  sclerosis/calcification is present, without any evidence of aortic stenosis.  6. 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  7. The inferior vena cava is normal in size with greater than 50% respiratory variability, suggesting right atrial pressure of 3 mmHg. Conclusion(s)/Recommendation(s): No evidence of valvular vegetations on this transthoracic echocardiogram. Consider a transesophageal echocardiogram to exclude infective endocarditis if clinically indicated. FINDINGS  Left Ventricle: Left ventricular ejection fraction, by estimation, is 60 to 65%. The left ventricle has normal function. The left ventricle has no regional wall motion abnormalities. The left ventricular internal cavity size was normal in size. There is  severe concentric left ventricular hypertrophy. Left ventricular diastolic parameters are indeterminate. Right Ventricle: The right ventricular size is normal. No increase in right ventricular wall thickness. Right ventricular systolic function is normal. Left Atrium: Left atrial size was moderately dilated. Right Atrium: Right atrial size was normal in size. Pericardium: There is no evidence of pericardial effusion. Presence of epicardial fat layer. Mitral Valve: The mitral valve is normal in structure. Mild mitral annular calcification. No evidence of mitral valve regurgitation. No evidence of mitral valve stenosis. Tricuspid Valve: The tricuspid valve is normal in structure. Tricuspid  valve regurgitation is trivial. No evidence of tricuspid stenosis. Aortic Valve: The aortic valve is tricuspid. There is moderate calcification of the aortic valve. There is moderate thickening of the aortic valve. Aortic valve regurgitation is not visualized. Aortic valve sclerosis/calcification is present, without any  evidence of aortic stenosis. Aortic valve mean gradient measures 6.0 mmHg. Aortic valve peak gradient  measures 10.9 mmHg. Aortic valve area, by VTI measures 2.32 cm. Pulmonic Valve: The pulmonic valve was normal in structure. Pulmonic valve regurgitation is not visualized. No evidence of pulmonic stenosis. 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 Venous: The inferior vena cava is normal in size with greater than 50% respiratory variability, suggesting right atrial pressure of 3 mmHg. IAS/Shunts: No atrial level shunt detected by color flow Doppler.  LEFT VENTRICLE PLAX 2D LVIDd:         3.70 cm   Diastology LVIDs:         1.80 cm   LV e' medial:    11.10 cm/s LV PW:         1.50 cm   LV E/e' medial:  11.1 LV IVS:        2.10 cm   LV e' lateral:   10.80 cm/s LVOT diam:     2.20 cm   LV E/e' lateral: 11.4 LV SV:         75 LV SV Index:   36 LVOT Area:     3.80 cm  RIGHT VENTRICLE             IVC RV Basal diam:  3.10 cm     IVC diam: 2.20 cm RV S prime:     19.90 cm/s TAPSE (M-mode): 2.4 cm LEFT ATRIUM           Index        RIGHT ATRIUM           Index LA diam:      3.30 cm 1.58 cm/m   RA Area:     25.00 cm LA Vol (A2C): 31.4 ml 14.99 ml/m  RA Volume:   83.90 ml  40.04 ml/m LA Vol (A4C): 47.2 ml 22.53 ml/m  AORTIC VALVE AV Area (Vmax):    1.98 cm AV Area (Vmean):   2.16 cm AV Area (VTI):     2.32 cm AV Vmax:           165.00 cm/s AV Vmean:          111.000 cm/s AV VTI:            0.323 m AV Peak Grad:      10.9 mmHg AV Mean Grad:      6.0 mmHg LVOT Vmax:         85.80 cm/s LVOT Vmean:        63.000 cm/s LVOT VTI:          0.197 m LVOT/AV VTI ratio: 0.61  AORTA Ao Root diam: 4.20 cm Ao Asc diam:  4.00 cm MITRAL VALVE MV Area (PHT): 5.06 cm     SHUNTS MV Decel Time: 150 msec     Systemic VTI:  0.20 m MV E velocity: 123.00 cm/s  Systemic Diam: 2.20 cm Kardie Tobb DO Electronically signed by Berniece Salines DO Signature Date/Time: 04/22/2022/6:02:16 PM    Final      LOS: 3 days   Signature  Lala Lund M.D  on 04/23/2022  at 12:28 PM   -  To page go to [email or web address]

## 2022-04-23 NOTE — Care Management Important Message (Signed)
Important Message  Patient Details  Name: Kathleen Wiley MRN: [id number] Date of Birth: Oct 11, 1946   Medicare Important Message Given:  Yes     Orbie Pyo 04/23/2022, 2:46 PM

## 2022-04-24 DIAGNOSIS — R7881 Bacteremia: Secondary | ICD-10-CM | POA: Diagnosis not present

## 2022-04-24 DIAGNOSIS — B954 Other streptococcus as the cause of diseases classified elsewhere: Secondary | ICD-10-CM | POA: Diagnosis not present

## 2022-04-24 LAB — COMPREHENSIVE METABOLIC PANEL
ALT: 16 U/L (ref 0–44)
AST: 17 U/L (ref 15–41)
Albumin: 2.1 g/dL — ABNORMAL LOW (ref 3.5–5.0)
Alkaline Phosphatase: 76 U/L (ref 38–126)
Anion gap: 6 (ref 5–15)
BUN: 12 mg/dL (ref 8–23)
CO2: 24 mmol/L (ref 22–32)
Calcium: 7.5 mg/dL — ABNORMAL LOW (ref 8.9–10.3)
Chloride: 103 mmol/L (ref 98–111)
Creatinine, Ser: 0.72 mg/dL (ref 0.44–1.00)
GFR, Estimated: >60 mL/min (ref >60)
Glucose, Bld: 139 mg/dL — ABNORMAL HIGH (ref 70–99)
Potassium: 3.9 mmol/L (ref 3.5–5.1)
Sodium: 133 mmol/L — ABNORMAL LOW (ref 135–145)
Total Bilirubin: 1.1 mg/dL (ref 0.3–1.2)
Total Protein: 5 g/dL — ABNORMAL LOW (ref 6.5–8.1)

## 2022-04-24 LAB — CBC WITH DIFFERENTIAL/PLATELET
Abs Immature Granulocytes: 0.04 10*3/uL (ref 0.00–0.07)
Basophils Absolute: 0 10*3/uL (ref 0.0–0.1)
Basophils Relative: 1 %
Eosinophils Absolute: 0.1 10*3/uL (ref 0.0–0.5)
Eosinophils Relative: 3 %
HCT: 27.2 % — ABNORMAL LOW (ref 36.0–46.0)
Hemoglobin: 8.9 g/dL — ABNORMAL LOW (ref 12.0–15.0)
Immature Granulocytes: 2 %
Lymphocytes Relative: 28 %
Lymphs Abs: 0.7 10*3/uL (ref 0.7–4.0)
MCH: 33.2 pg (ref 26.0–34.0)
MCHC: 32.7 g/dL (ref 30.0–36.0)
MCV: 101.5 fL — ABNORMAL HIGH (ref 80.0–100.0)
Monocytes Absolute: 0.3 10*3/uL (ref 0.1–1.0)
Monocytes Relative: 11 %
Neutro Abs: 1.4 10*3/uL — ABNORMAL LOW (ref 1.7–7.7)
Neutrophils Relative %: 55 %
Platelets: 71 10*3/uL — ABNORMAL LOW (ref 150–400)
RBC: 2.68 MIL/uL — ABNORMAL LOW (ref 3.87–5.11)
RDW: 14.6 % (ref 11.5–15.5)
WBC: 2.6 10*3/uL — ABNORMAL LOW (ref 4.0–10.5)
nRBC: 0 % (ref 0.0–0.2)

## 2022-04-24 LAB — CULTURE, BLOOD (ROUTINE X 2)
Culture: NO GROWTH
Special Requests: ADEQUATE

## 2022-04-24 LAB — MAGNESIUM: Magnesium: 2 mg/dL (ref 1.7–2.4)

## 2022-04-24 LAB — GLUCOSE, CAPILLARY
Glucose-Capillary: 130 mg/dL — ABNORMAL HIGH (ref 70–99)
Glucose-Capillary: 144 mg/dL — ABNORMAL HIGH (ref 70–99)
Glucose-Capillary: 134 mg/dL — ABNORMAL HIGH (ref 70–99)
Glucose-Capillary: 119 mg/dL — ABNORMAL HIGH (ref 70–99)

## 2022-04-24 LAB — PROCALCITONIN: Procalcitonin: 5.61 ng/mL

## 2022-04-24 LAB — C-REACTIVE PROTEIN: CRP: 16 mg/dL — ABNORMAL HIGH (ref <1.0)

## 2022-04-24 MED ORDER — FOLIC ACID 1 MG PO TABS
1.0000 mg | ORAL_TABLET | Freq: Every day | ORAL | Status: DC
  Administered 2022-04-24 – 2022-04-27 (×4): 1 mg via ORAL
  Filled 2022-04-24 (×4): qty 1

## 2022-04-24 MED ORDER — FERROUS SULFATE 325 (65 FE) MG PO TABS
325.0000 mg | ORAL_TABLET | Freq: Two times a day (BID) | ORAL | Status: DC
  Administered 2022-04-24 – 2022-04-27 (×7): 325 mg via ORAL
  Filled 2022-04-24 (×7): qty 1

## 2022-04-24 NOTE — Progress Notes (Signed)
PROGRESS NOTE        PATIENT DETAILS Name: Kathleen Wiley Age: 76 y.o. Sex: female Date of Birth: 14-Jul-1946 Admit Date: 04/19/2022 Admitting Physician Evalee Mutton Kristeen Mans, MD ZOX:[id number], Ivin Booty, MD  Brief Summary: Patient is a 76 y.o.  female with history of DM-2, HTN, HLD who presented with fever, vomiting, and RLE erythema swelling-found to have sepsis RLE cellulitis with streptococcal bacteremia.   Significant events: 6/12>> admit to [facility] for RLE swelling/cellulitis.  Significant studies: 6/12>> CT  right tibia/fibula: Diffuse subcutaneous edema/swelling-no evidence of soft tissue gas/abscess. 6/12>> CXR: No PNA 6/13>> CT head: No acute abnormalities 6/13>>RLE doppler-neg for DVT 6/15>> vitamin B12 level: Normal 6/15 >>   TTE -   1. Left ventricular ejection fraction, by estimation, is 60 to 65%. The left ventricle has normal function. The left ventricle has no regional wall motion abnormalities. There is severe concentric left ventricular hypertrophy. Left ventricular diastolic  parameters are indeterminate.  2. Right ventricular systolic function is normal. The right ventricular size is normal.  3. Left atrial size was moderately dilated.  4. The mitral valve is normal in structure. No evidence of mitral valve regurgitation. No evidence of mitral stenosis.  5. The aortic valve is tricuspid. There is moderate calcification of the aortic valve. There is moderate thickening of the aortic valve. Aortic valve regurgitation is not visualized. Aortic valve sclerosis/calcification is present, without any evidence of aortic stenosis.  6. 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  7. The inferior vena cava is normal in size with greater than 50% respiratory variability, suggesting right atrial pressure of 3 mmHg. Conclusion(s)/Recommendation(s): No evidence of valvular  vegetations on this transthoracic echocardiogram. Consider a transesophageal echocardiogram to exclude infective endocarditis if clinically indicated.  6/15 >> MRI    1. Redemonstration of diffuse anterolateral and anteromedial subcutaneous edema and swelling of the right calf. Findings are again compatible with cellulitis. Again no walled-off abscess or deep fascial plane fluid is seen. 2. No acute bone abnormality. 3. 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  04/23/2022. right lower extremity debridement and removal of nonviable tissue by Dr. Sharol Given.   Significant microbiology data: 6/12>> COVID/influenza PCR: Negative 6/12>> urine culture: Klebsiella (colonization) 6/12>> blood culture: 1/2-Streptococcus group G 6/14>> blood culture: No growth  Procedures:    Consults: ID, Ortho  Subjective:  Patient in bed, appears comfortable, denies any headache, no fever, no chest pain or pressure, no shortness of breath , no abdominal pain. No focal weakness.   Objective: Vitals: Blood pressure 128/79, pulse 82, temperature 98.8 F (37.1 C), temperature source Oral, resp. rate 20, height '5\' 4"'$  (1.626 m), weight 106.6 kg, SpO2 100 %.   Exam:  Awake Alert, No new F.N deficits, Normal affect [place].AT,PERRAL Supple Neck, No JVD,   Symmetrical Chest wall movement, Good air movement bilaterally, CTAB RRR,No Gallops, Rubs or new Murmurs,  +ve B.Sounds, Abd Soft, No tenderness,   Skin: Right lower extremity under bandage with wound VAC   Assessment/Plan:  Sepsis due to RLE cellulitis and streptococcal bacteremia: Although sepsis physiology better-no fever-BP stable-she continues to have persistent RLE pain and leukopenia/thrombocytopenia. ID following-on penicillin/Zyvox-repeat cultures negative-unremarkable TTE.  CT and MRI of the right lower extremity failed to show any abscess however on exam the leg looked  extremely angry, he was taken to the OR by Dr. Sharol Given on 04/23/2022 and  underwent debridement and removal of nonviable tissue and dark serosanguineous fluid.  Intraoperative wound cultures pending.   Acute metabolic encephalopathy: Resolved-likely due to sepsis.  She continues to be completely awake and alert.  AKI: Improved-likely hemodynamically mediated.  Follow periodically.  Pancytopenia: Suspect due to sepsis/infection-B12 level stable-ferritin count significantly elevated-reflecting acute phase reactant.  Hold SQ heparin-continue to monitor CBC.  Recent PT/INR was negative.  No signs of bleeding.  Follow CBC-repeat INR, question if the Zyvox is contributing to some of it.  Overall counts have stabilized.  HTN: Apparently not on any antihypertensives at home-BP was soft yesterday-today more stable-watch closely.  HLD: Continue statin  Depression/anxiety: Resume Lexapro  DM-2 (A1c 5.2 on 6/30): Continue SSI-resume Ozempic on discharge.  Recent Labs    04/23/22 1554 04/23/22 2145 04/24/22 0756  GLUCAP 93 129* 130*     Morbid Obesity Estimated body mass index is 40.34 kg/m as calculated from the following:   Height as of this encounter: '5\' 4"'$  (1.626 m).   Weight as of this encounter: 106.6 kg.   Code status:   Code Status: Full Code   DVT Prophylaxis:    Family Communication: Spouse updated on 6/14-no family at bedside on 6/15.   Disposition Plan: Status is: Inpatient   Planned Discharge Destination:Home   Diet: Diet Order             Diet Carb Modified Fluid consistency: Thin; Room service appropriate? Yes  Diet effective now                    MEDICATIONS: Scheduled Meds:  acetaminophen  1,000 mg Oral Q8H   escitalopram  20 mg Oral Daily   ferrous sulfate  325 mg Oral BID WC   folic acid  1 mg Oral Daily   gabapentin  300 mg Oral TID   insulin aspart  0-9 Units Subcutaneous TID WC   linezolid  600 mg Oral Q12H   rosuvastatin  5 mg Oral Daily   cyanocobalamin  1,000 mcg Oral Daily   Continuous Infusions:   penicillin G potassium 12 Million Units in dextrose 5 % 500 mL continuous infusion 12 Million Units (04/24/22 0000)   PRN Meds:.morphine injection, ondansetron (ZOFRAN) IV, oxyCODONE   I have personally reviewed following labs and imaging studies  LABORATORY DATA:  Recent Labs  Lab 04/19/22 1730 04/20/22 0150 04/21/22 0056 04/22/22 0041 04/23/22 0108 04/24/22 0127  WBC 4.1 4.3 2.6* 2.4* 2.5* 2.6*  HGB 13.4 12.4 10.0* 9.6* 9.1* 8.9*  HCT 40.4 37.8 29.6* 28.7* 27.3* 27.2*  PLT PLATELET CLUMPS NOTED ON SMEAR, UNABLE TO ESTIMATE 78* 60* 53* 58* 71*  MCV 100.7* 103.0* 97.7 99.3 98.6 101.5*  MCH 33.4 33.8 33.0 33.2 32.9 33.2  MCHC 33.2 32.8 33.8 33.4 33.3 32.7  RDW 14.1 14.5 14.8 14.7 14.5 14.6  LYMPHSABS 0.3* 0.3*  --  0.5* 0.6* 0.7  MONOABS 0.2 0.3  --  0.2 0.2 0.3  EOSABS 0.0 0.0  --  0.0 0.1 0.1  BASOSABS 0.1 0.1  --  0.0 0.0 0.0    Recent Labs  Lab 04/19/22 2033 04/19/22 2226 04/20/22 0150 04/20/22 0605 04/21/22 0056 04/22/22 0041 04/23/22 0108 04/24/22 0127  NA  --   --  135  --  134* 136 135 133*  K  --   --  3.8  --  3.4* 3.9 3.9 3.9  CL  --   --  99  --  102 105 103 103  CO2  --   --  23  --  '22 23 23 24  '$ GLUCOSE  --   --  135*  --  104* 128* 116* 139*  BUN  --   --  22  --  25* '19 15 12  '$ CREATININE  --   --  1.40*  --  1.11* 0.95 0.83 0.72  CALCIUM  --   --  8.8*  --  7.7* 7.9* 8.2* 7.5*  AST  --   --  33  --  35 '27 21 17  '$ ALT  --   --  26  --  '22 21 16 16  '$ ALKPHOS  --   --  62  --  47 54 64 76  BILITOT  --   --  2.0*  --  1.3* 1.6* 1.8* 1.1  ALBUMIN  --   --  3.4*  --  2.7* 2.5* 2.3* 2.1*  MG  --   --   --   --   --  1.6* 2.2 2.0  CRP  --   --   --   --   --   --   --  16.0*  PROCALCITON  --   --   --   --   --   --  11.24 5.61  LATICACIDVEN 5.2* 6.7* 4.2* 3.8*  --   --   --   --   INR 1.2  --   --   --   --   --  1.1  --   HGBA1C  --   --  5.2  --   --   --   --   --          RADIOLOGY STUDIES/RESULTS: MR TIBIA FIBULA RIGHT WO CONTRAST  Result  Date: 04/23/2022 CLINICAL DATA:  Soft tissue infection suspected. Hospital admission 2 weeks ago for right leg cellulitis. Right-greater-than-left leg redness and pain. Sepsis. History of diabetes. EXAM: MRI OF LOWER RIGHT EXTREMITY WITHOUT CONTRAST TECHNIQUE: Multiplanar, multisequence MR imaging of the right tibia and fibula was performed. No intravenous contrast was administered. COMPARISON:  Right tibia and fibula radiographs 04/19/2022; CT right tibia and fibula 04/19/2022 FINDINGS: Bones/Joint/Cartilage Magnetic susceptibility artifact is seen from total right knee arthroplasty hardware. Within the limitations of the metallic artifact and inhomogeneous fat saturation no definite tibia or fibula bone marrow signal abnormality is seen. No cortical destruction is seen. Ligaments On large field-of-view images, the left tibia and fibula are included. The left knee medial collateral ligament and fibular collateral ligament are grossly intact. Muscles and Tendons There is a fat intensity lipoma within the anterior aspect of the distal right medial head of the gastrocnemius muscle measuring up to 1.7 x 2.5 x 5.4 cm (transverse by AP by craniocaudal), benign (axial series 11, image 33 and coronal series 3, image 18). Mild feathery fatty infiltration of the distal lateral aspect of the medial head of the gastrocnemius muscle. Increased T2 signal is seen within the proximal anterior compartment and deep posterior compartment on axial T2 fat saturation images (series 14 images 15 through 21), however this may be artifactual due to inhomogeneous fat saturation as this is not seen on coronal STIR images. No deep fascial plane fluid is seen. Soft tissues There is diffuse edema and swelling throughout the right lower extremity subcutaneous fat, greatest within the proximal anterior shin subcutaneous fat but also seen throughout the anterolateral and anterior medial shin  subcutaneous fat. On large field-of-view coronal images  of the left leg demonstrates more mild diffuse subcutaneous fat edema predominantly within the distal 50% of the left calf. No walled-off fluid collection. IMPRESSION: 1. Redemonstration of diffuse anterolateral and anteromedial subcutaneous edema and swelling of the right calf. Findings are again compatible with cellulitis. Again no walled-off abscess or deep fascial plane fluid is seen. 2. No acute bone abnormality. 3. 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 Electronically Signed   By: Yvonne Kendall M.D.   On: 04/23/2022 09:06   ECHOCARDIOGRAM COMPLETE  Result Date: 04/22/2022    ECHOCARDIOGRAM REPORT   Patient Name:   Kathleen Wiley Date of Exam: 04/22/2022 Medical Rec #:  [id number]       Height:       64.0 in Accession #:    [id number]      Weight:       235.0 lb Date of Birth:  [birth year]       BSA:          2.095 m Patient Age:    18 years        BP:           107/62 mmHg Patient Gender: F               HR:           82 bpm. Exam Location:  Inpatient Procedure: 2D Echo, Cardiac Doppler and Color Doppler Indications:    Bacteremia  History:        Patient has prior history of Echocardiogram examinations, most                 recent 08/24/2019. Arrythmias:Atrial Fibrillation.  Sonographer:    Clayton Lefort RDCS (AE) Referring Phys: [place]  Sonographer Comments: Suboptimal apical window. IMPRESSIONS  1. Left ventricular ejection fraction, by estimation, is 60 to 65%. The left ventricle has normal function. The left ventricle has no regional wall motion abnormalities. There is severe concentric left ventricular hypertrophy. Left ventricular diastolic  parameters are indeterminate.  2. Right ventricular systolic function is normal. The right ventricular size is normal.  3. Left atrial size was moderately dilated.  4. The mitral valve is normal in structure. No evidence of mitral valve regurgitation. No evidence of mitral stenosis.  5. The aortic valve is tricuspid. There is moderate  calcification of the aortic valve. There is moderate thickening of the aortic valve. Aortic valve regurgitation is not visualized. Aortic valve sclerosis/calcification is present, without any evidence of aortic stenosis.  6. 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  7. The inferior vena cava is normal in size with greater than 50% respiratory variability, suggesting right atrial pressure of 3 mmHg. Conclusion(s)/Recommendation(s): No evidence of valvular vegetations on this transthoracic echocardiogram. Consider a transesophageal echocardiogram to exclude infective endocarditis if clinically indicated. FINDINGS  Left Ventricle: Left ventricular ejection fraction, by estimation, is 60 to 65%. The left ventricle has normal function. The left ventricle has no regional wall motion abnormalities. The left ventricular internal cavity size was normal in size. There is  severe concentric left ventricular hypertrophy. Left ventricular diastolic parameters are indeterminate. Right Ventricle: The right ventricular size is normal. No increase in right ventricular wall thickness. Right ventricular systolic function is normal. Left Atrium: Left atrial size was moderately dilated. Right Atrium: Right atrial size was normal in size. Pericardium: There is  no evidence of pericardial effusion. Presence of epicardial fat layer. Mitral Valve: The mitral valve is normal in structure. Mild mitral annular calcification. No evidence of mitral valve regurgitation. No evidence of mitral valve stenosis. Tricuspid Valve: The tricuspid valve is normal in structure. Tricuspid valve regurgitation is trivial. No evidence of tricuspid stenosis. Aortic Valve: The aortic valve is tricuspid. There is moderate calcification of the aortic valve. There is moderate thickening of the aortic valve. Aortic valve regurgitation is not visualized. Aortic valve  sclerosis/calcification is present, without any  evidence of aortic stenosis. Aortic valve mean gradient measures 6.0 mmHg. Aortic valve peak gradient measures 10.9 mmHg. Aortic valve area, by VTI measures 2.32 cm. Pulmonic Valve: The pulmonic valve was normal in structure. Pulmonic valve regurgitation is not visualized. No evidence of pulmonic stenosis. 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 Venous: The inferior vena cava is normal in size with greater than 50% respiratory variability, suggesting right atrial pressure of 3 mmHg. IAS/Shunts: No atrial level shunt detected by color flow Doppler.  LEFT VENTRICLE PLAX 2D LVIDd:         3.70 cm   Diastology LVIDs:         1.80 cm   LV e' medial:    11.10 cm/s LV PW:         1.50 cm   LV E/e' medial:  11.1 LV IVS:        2.10 cm   LV e' lateral:   10.80 cm/s LVOT diam:     2.20 cm   LV E/e' lateral: 11.4 LV SV:         75 LV SV Index:   36 LVOT Area:     3.80 cm  RIGHT VENTRICLE             IVC RV Basal diam:  3.10 cm     IVC diam: 2.20 cm RV S prime:     19.90 cm/s TAPSE (M-mode): 2.4 cm LEFT ATRIUM           Index        RIGHT ATRIUM           Index LA diam:      3.30 cm 1.58 cm/m   RA Area:     25.00 cm LA Vol (A2C): 31.4 ml 14.99 ml/m  RA Volume:   83.90 ml  40.04 ml/m LA Vol (A4C): 47.2 ml 22.53 ml/m  AORTIC VALVE AV Area (Vmax):    1.98 cm AV Area (Vmean):   2.16 cm AV Area (VTI):     2.32 cm AV Vmax:           165.00 cm/s AV Vmean:          111.000 cm/s AV VTI:            0.323 m AV Peak Grad:      10.9 mmHg AV Mean Grad:      6.0 mmHg LVOT Vmax:         85.80 cm/s LVOT Vmean:        63.000 cm/s LVOT VTI:          0.197 m LVOT/AV VTI ratio: 0.61  AORTA Ao Root diam: 4.20 cm Ao Asc diam:  4.00 cm MITRAL VALVE MV Area (PHT): 5.06 cm     SHUNTS MV Decel Time: 150 msec     Systemic VTI:  0.20 m  MV E velocity: 123.00 cm/s  Systemic Diam: 2.20 cm Kardie  Tobb DO Electronically signed by Berniece Salines DO Signature Date/Time: 04/22/2022/6:02:16 PM    Final      LOS: 4 days   Signature  Lala Lund M.D on 04/24/2022 at 9:47 AM   -  To page go to [email or web address]

## 2022-04-24 NOTE — Progress Notes (Signed)
PT Cancellation Note  Patient Details Name: Kathleen Wiley MRN: [id number] DOB: 02-22-1946   Cancelled Treatment:    Reason Eval/Treat Not Completed: Other (comment). Pt declining, stating "it's not a good time." Reports she is waiting on NT to return for bath. Also reports bleeding from IV removal R wrist and awaiting RN return. Blood saturated dressing noted over site.    Lorriane Shire 04/24/2022, 11:57 AM  Lorrin Goodell, PT  Office # [phone] Pager [phone]

## 2022-04-24 NOTE — Anesthesia Postprocedure Evaluation (Signed)
Anesthesia Post Note  Patient: Kathleen Wiley  Procedure(s) Performed: RIGHT LEG DEBRIDEMENT (Right)     Patient location during evaluation: PACU Anesthesia Type: General Level of consciousness: awake and alert Pain management: pain level controlled Vital Signs Assessment: post-procedure vital signs reviewed and stable Respiratory status: spontaneous breathing, nonlabored ventilation, respiratory function stable and patient connected to nasal cannula oxygen Cardiovascular status: blood pressure returned to baseline and stable Postop Assessment: no apparent nausea or vomiting Anesthetic complications: no   No notable events documented.  Last Vitals:  Vitals:   04/24/22 0644 04/24/22 0719  BP: 124/65 128/79  Pulse: 85 82  Resp: (!) 21 20  Temp:  37.1 C  SpO2: 99% 100%    Last Pain:  Vitals:   04/24/22 0823  TempSrc:   PainSc: 5                  [place] L Kessa Fairbairn

## 2022-04-24 NOTE — Progress Notes (Signed)
Patient ID: Kathleen Wiley, female   DOB: April 28, 1946, 76 y.o.   MRN: [id number] Patient is a 76 year old woman who is postoperative day 1 debridement of right lower extremity.  The cultures are pending.  The wound VAC is functioning well.  Anticipate discharge on oral antibiotics pending culture sensitivities.

## 2022-04-24 NOTE — Progress Notes (Signed)
[facility] for Infectious Disease   Reason for visit: Follow up on bacteremia  Interval History: s/p operative debridement and now with VAC placement.  No necrotizing fasciitis noted.  Repeat blood cultures ngtd.   Day 6 total antibiotics Day 4 linezolid  Physical Exam: Constitutional:  Vitals:   04/24/22 0719 04/24/22 1215  BP: 128/79 (!) 127/58  Pulse: 82 84  Resp: 20 20  Temp: 98.8 F (37.1 C) 98.7 F (37.1 C)  SpO2: 100% 98%   patient appears in NAD Respiratory: Normal respiratory effort MS: leg wrapped  Review of Systems: Constitutional: negative for fevers and chills  Lab Results  Component Value Date   WBC 2.6 (L) 04/24/2022   HGB 8.9 (L) 04/24/2022   HCT 27.2 (L) 04/24/2022   MCV 101.5 (H) 04/24/2022   PLT 71 (L) 04/24/2022    Lab Results  Component Value Date   CREATININE 0.72 04/24/2022   BUN 12 04/24/2022   NA 133 (L) 04/24/2022   K 3.9 04/24/2022   CL 103 04/24/2022   CO2 24 04/24/2022    Lab Results  Component Value Date   ALT 16 04/24/2022   AST 17 04/24/2022   ALKPHOS 76 04/24/2022     Microbiology: Recent Results (from the past 240 hour(s))  Resp Panel by RT-PCR (Flu A&B, Covid) Anterior Nasal Swab     Status: None   Collection Time: 04/19/22  8:05 PM   Specimen: Anterior Nasal Swab  Result Value Ref Range Status   SARS Coronavirus 2 by RT PCR NEGATIVE NEGATIVE Final    Comment: (NOTE) SARS-CoV-2 target nucleic acids are NOT DETECTED.  The SARS-CoV-2 RNA is generally detectable in upper respiratory specimens during the acute phase of infection. The lowest concentration of SARS-CoV-2 viral copies this assay can detect is 138 copies/mL. A negative result does not preclude SARS-Cov-2 infection and should not be used as the sole basis for treatment or other patient management decisions. A negative result may occur with  improper specimen collection/handling, submission of specimen other than nasopharyngeal swab, presence of  viral mutation(s) within the areas targeted by this assay, and inadequate number of viral copies(<138 copies/mL). A negative result must be combined with clinical observations, patient history, and epidemiological information. The expected result is Negative.  Fact Sheet for Patients:  EntrepreneurPulse.com.au  Fact Sheet for Healthcare Providers:  IncredibleEmployment.be  This test is no t yet approved or cleared by the [place] FDA and  has been authorized for detection and/or diagnosis of SARS-CoV-2 by FDA under an Emergency Use Authorization (EUA). This EUA will remain  in effect (meaning this test can be used) for the duration of the COVID-19 declaration under Section 564(b)(1) of the Act, 21 U.S.C.section 360bbb-3(b)(1), unless the authorization is terminated  or revoked sooner.       Influenza A by PCR NEGATIVE NEGATIVE Final   Influenza B by PCR NEGATIVE NEGATIVE Final    Comment: (NOTE) The Xpert Xpress SARS-CoV-2/FLU/RSV plus assay is intended as an aid in the diagnosis of influenza from Nasopharyngeal swab specimens and should not be used as a sole basis for treatment. Nasal washings and aspirates are unacceptable for Xpert Xpress SARS-CoV-2/FLU/RSV testing.  Fact Sheet for Patients: EntrepreneurPulse.com.au  Fact Sheet for Healthcare Providers: IncredibleEmployment.be  This test is not yet approved or cleared by the [place] FDA and has been authorized for detection and/or diagnosis of SARS-CoV-2 by FDA under an Emergency Use Authorization (EUA). This EUA will remain in effect (meaning this  test can be used) for the duration of the COVID-19 declaration under Section 564(b)(1) of the Act, 21 U.S.C. section 360bbb-3(b)(1), unless the authorization is terminated or revoked.  Performed at [facility], [place] [address], [place], [place] [address]   Blood Culture (routine x 2)      Status: None   Collection Time: 04/19/22  8:24 PM   Specimen: BLOOD  Result Value Ref Range Status   Specimen Description BLOOD RIGHT ANTECUBITAL  Final   Special Requests   Final    BOTTLES DRAWN AEROBIC AND ANAEROBIC Blood Culture adequate volume   Culture   Final    NO GROWTH 5 DAYS Performed at [facility], [place] [address], [place], [place] [address]    Report Status 04/24/2022 FINAL  Final  Blood Culture (routine x 2)     Status: Abnormal   Collection Time: 04/19/22  8:33 PM   Specimen: BLOOD  Result Value Ref Range Status   Specimen Description BLOOD RIGHT ANTECUBITAL  Final   Special Requests   Final    BOTTLES DRAWN AEROBIC AND ANAEROBIC Blood Culture adequate volume   Culture  Setup Time   Final    GRAM POSITIVE COCCI IN CHAINS IN BOTH AEROBIC AND ANAEROBIC BOTTLES CRITICAL RESULT CALLED TO, READ BACK BY AND VERIFIED WITH:  C/ ELIZABETH M., PHARMD 04/20/22 1254 A. LAFRANCE Performed at [facility], [place] [address] [place], [place] [address]    Culture STREPTOCOCCUS GROUP G (A)  Final   Report Status 04/22/2022 FINAL  Final   Organism ID, Bacteria STREPTOCOCCUS GROUP G  Final      Susceptibility   Streptococcus group g - MIC*    CLINDAMYCIN RESISTANT Resistant     AMPICILLIN <=0.25 SENSITIVE Sensitive     VANCOMYCIN 0.25 SENSITIVE Sensitive     CEFTRIAXONE <=0.12 SENSITIVE Sensitive     LEVOFLOXACIN 2 SENSITIVE Sensitive     * STREPTOCOCCUS GROUP G  Blood Culture ID Panel (Reflexed)     Status: Abnormal   Collection Time: 04/19/22  8:33 PM  Result Value Ref Range Status   Enterococcus faecalis NOT DETECTED NOT DETECTED Final   Enterococcus Faecium NOT DETECTED NOT DETECTED Final   Listeria monocytogenes NOT DETECTED NOT DETECTED Final   Staphylococcus species NOT DETECTED NOT DETECTED Final   Staphylococcus aureus (BCID) NOT DETECTED NOT DETECTED Final   Staphylococcus epidermidis NOT DETECTED NOT DETECTED Final   Staphylococcus lugdunensis NOT  DETECTED NOT DETECTED Final   Streptococcus species DETECTED (A) NOT DETECTED Final    Comment: Not Enterococcus species, Streptococcus agalactiae, Streptococcus pyogenes, or Streptococcus pneumoniae. CRITICAL RESULT CALLED TO, READ BACK BY AND VERIFIED WITH:  C/ ELIZABETH M., PHARMD 04/20/22 1254 A. LAFRANCE    Streptococcus agalactiae NOT DETECTED NOT DETECTED Final   Streptococcus pneumoniae NOT DETECTED NOT DETECTED Final   Streptococcus pyogenes NOT DETECTED NOT DETECTED Final   A.calcoaceticus-baumannii NOT DETECTED NOT DETECTED Final   Bacteroides fragilis NOT DETECTED NOT DETECTED Final   Enterobacterales NOT DETECTED NOT DETECTED Final   Enterobacter cloacae complex NOT DETECTED NOT DETECTED Final   Escherichia coli NOT DETECTED NOT DETECTED Final   Klebsiella aerogenes NOT DETECTED NOT DETECTED Final   Klebsiella oxytoca NOT DETECTED NOT DETECTED Final   Klebsiella pneumoniae NOT DETECTED NOT DETECTED Final   Proteus species NOT DETECTED NOT DETECTED Final   Salmonella species NOT DETECTED NOT DETECTED Final   Serratia marcescens NOT DETECTED NOT DETECTED Final   Haemophilus influenzae  NOT DETECTED NOT DETECTED Final   Neisseria meningitidis NOT DETECTED NOT DETECTED Final   Pseudomonas aeruginosa NOT DETECTED NOT DETECTED Final   Stenotrophomonas maltophilia NOT DETECTED NOT DETECTED Final   Candida albicans NOT DETECTED NOT DETECTED Final   Candida auris NOT DETECTED NOT DETECTED Final   Candida glabrata NOT DETECTED NOT DETECTED Final   Candida krusei NOT DETECTED NOT DETECTED Final   Candida parapsilosis NOT DETECTED NOT DETECTED Final   Candida tropicalis NOT DETECTED NOT DETECTED Final   Cryptococcus neoformans/gattii NOT DETECTED NOT DETECTED Final    Comment: Performed at [facility], [place] [address] [place], [place] [address]  Urine Culture     Status: Abnormal   Collection Time: 04/19/22  9:23 PM   Specimen: In/Out Cath Urine  Result Value Ref Range  Status   Specimen Description IN/OUT CATH URINE  Final   Special Requests   Final    NONE Performed at [facility], [place] [address] [place], [place] [address]    Culture >=100,000 COLONIES/mL KLEBSIELLA PNEUMONIAE (A)  Final   Report Status 04/22/2022 FINAL  Final   Organism ID, Bacteria KLEBSIELLA PNEUMONIAE (A)  Final      Susceptibility   Klebsiella pneumoniae - MIC*    AMPICILLIN RESISTANT Resistant     CEFAZOLIN <=4 SENSITIVE Sensitive     CEFEPIME <=0.12 SENSITIVE Sensitive     CEFTRIAXONE <=0.25 SENSITIVE Sensitive     CIPROFLOXACIN <=0.25 SENSITIVE Sensitive     GENTAMICIN <=1 SENSITIVE Sensitive     IMIPENEM 0.5 SENSITIVE Sensitive     NITROFURANTOIN 128 RESISTANT Resistant     TRIMETH/SULFA <=20 SENSITIVE Sensitive     AMPICILLIN/SULBACTAM <=2 SENSITIVE Sensitive     PIP/TAZO <=4 SENSITIVE Sensitive     * >=100,000 COLONIES/mL KLEBSIELLA PNEUMONIAE  Culture, blood (Routine X 2) w Reflex to ID Panel     Status: None (Preliminary result)   Collection Time: 04/21/22  1:56 PM   Specimen: BLOOD  Result Value Ref Range Status   Specimen Description BLOOD RIGHT ANTECUBITAL  Final   Special Requests   Final    BOTTLES DRAWN AEROBIC AND ANAEROBIC Blood Culture adequate volume   Culture   Final    NO GROWTH 3 DAYS Performed at [facility], [address], [place], [place] [address]    Report Status PENDING  Incomplete  Culture, blood (Routine X 2) w Reflex to ID Panel     Status: None (Preliminary result)   Collection Time: 04/21/22  1:57 PM   Specimen: BLOOD LEFT HAND  Result Value Ref Range Status   Specimen Description BLOOD LEFT HAND  Final   Special Requests   Final    BOTTLES DRAWN AEROBIC ONLY Blood Culture results may not be optimal due to an inadequate volume of blood received in culture bottles   Culture   Final    NO GROWTH 3 DAYS Performed at [facility], [place] [address], [place], [place] [address]    Report Status PENDING  Incomplete   MRSA Next Gen by PCR, Nasal     Status: None   Collection Time: 04/23/22  7:08 AM   Specimen: Nasal Mucosa; Nasal Swab  Result Value Ref Range Status   MRSA by PCR Next Gen NOT DETECTED NOT DETECTED Final    Comment: (NOTE) The GeneXpert MRSA Assay (FDA approved for NASAL specimens only), is one component of a comprehensive MRSA colonization surveillance program. It is not intended  to diagnose MRSA infection nor to guide or monitor treatment for MRSA infections. Test performance is not FDA approved in patients less than 5 years old. Performed at [facility], [place] [address], [place], [place] [address]   Aerobic/Anaerobic Culture w Gram Stain (surgical/deep wound)     Status: None (Preliminary result)   Collection Time: 04/23/22  2:08 PM   Specimen: Soft Tissue, Other  Result Value Ref Range Status   Specimen Description TISSUE  Final   Special Requests MEDIAL RIGHT LEG, SPEC A  Final   Gram Stain NO WBC SEEN NO ORGANISMS SEEN   Final   Culture   Final    NO GROWTH < 24 HOURS Performed at [facility], [place] [address], [place], [place] [address]    Report Status PENDING  Incomplete  Aerobic/Anaerobic Culture w Gram Stain (surgical/deep wound)     Status: None (Preliminary result)   Collection Time: 04/23/22  2:10 PM   Specimen: PATH Soft tissue  Result Value Ref Range Status   Specimen Description TISSUE  Final   Special Requests LATERAL RIGHT LEG. SPEC B  Final   Gram Stain NO WBC SEEN NO ORGANISMS SEEN   Final   Culture   Final    NO GROWTH < 24 HOURS Performed at [facility], [place] [address], [place], [place] [address]    Report Status PENDING  Incomplete    Impression/Plan:  1. Right lower leg cellulitis - now s/p operative debridement and vac placement.  No new culture growth.  No signs of necrotizing fascia intraoperatively. At this point with no necrotizing signs, I will stop linezolid She will continue with penicillin and I recommend 7 days of  amoxicillin 1000 mg three times a day at discharge.  2.  Bacteremia - group G Strep and repeat cultures negative.  Treatment as listed above.   3.  Disposition - plan as above with oral amoxicillin.  She will follow up with Dr. Sharol Given with the [facility] and wound.  She can follow up with ID if needed [facility] for discharge from ID standpoint.   I will sign off.  No ID follow up indicated.

## 2022-04-25 ENCOUNTER — Encounter (HOSPITAL_COMMUNITY): Payer: Self-pay | Admitting: Orthopedic Surgery

## 2022-04-25 DIAGNOSIS — B954 Other streptococcus as the cause of diseases classified elsewhere: Secondary | ICD-10-CM | POA: Diagnosis not present

## 2022-04-25 LAB — COMPREHENSIVE METABOLIC PANEL
ALT: 15 U/L (ref 0–44)
AST: 16 U/L (ref 15–41)
Albumin: 2 g/dL — ABNORMAL LOW (ref 3.5–5.0)
Alkaline Phosphatase: 87 U/L (ref 38–126)
Anion gap: 10 (ref 5–15)
BUN: 9 mg/dL (ref 8–23)
CO2: 23 mmol/L (ref 22–32)
Calcium: 7.7 mg/dL — ABNORMAL LOW (ref 8.9–10.3)
Chloride: 103 mmol/L (ref 98–111)
Creatinine, Ser: 0.73 mg/dL (ref 0.44–1.00)
GFR, Estimated: >60 mL/min (ref >60)
Glucose, Bld: 116 mg/dL — ABNORMAL HIGH (ref 70–99)
Potassium: 4 mmol/L (ref 3.5–5.1)
Sodium: 136 mmol/L (ref 135–145)
Total Bilirubin: 1.2 mg/dL (ref 0.3–1.2)
Total Protein: 5.1 g/dL — ABNORMAL LOW (ref 6.5–8.1)

## 2022-04-25 LAB — CBC WITH DIFFERENTIAL/PLATELET
Abs Immature Granulocytes: 0.05 10*3/uL (ref 0.00–0.07)
Basophils Absolute: 0 10*3/uL (ref 0.0–0.1)
Basophils Relative: 1 %
Eosinophils Absolute: 0.1 10*3/uL (ref 0.0–0.5)
Eosinophils Relative: 2 %
HCT: 27 % — ABNORMAL LOW (ref 36.0–46.0)
Hemoglobin: 8.9 g/dL — ABNORMAL LOW (ref 12.0–15.0)
Immature Granulocytes: 2 %
Lymphocytes Relative: 34 %
Lymphs Abs: 1 10*3/uL (ref 0.7–4.0)
MCH: 32.8 pg (ref 26.0–34.0)
MCHC: 33 g/dL (ref 30.0–36.0)
MCV: 99.6 fL (ref 80.0–100.0)
Monocytes Absolute: 0.2 10*3/uL (ref 0.1–1.0)
Monocytes Relative: 9 %
Neutro Abs: 1.5 10*3/uL — ABNORMAL LOW (ref 1.7–7.7)
Neutrophils Relative %: 52 %
Platelets: 84 10*3/uL — ABNORMAL LOW (ref 150–400)
RBC: 2.71 MIL/uL — ABNORMAL LOW (ref 3.87–5.11)
RDW: 14.6 % (ref 11.5–15.5)
WBC: 2.8 10*3/uL — ABNORMAL LOW (ref 4.0–10.5)
nRBC: 0 % (ref 0.0–0.2)

## 2022-04-25 LAB — GLUCOSE, CAPILLARY
Glucose-Capillary: 142 mg/dL — ABNORMAL HIGH (ref 70–99)
Glucose-Capillary: 96 mg/dL (ref 70–99)
Glucose-Capillary: 149 mg/dL — ABNORMAL HIGH (ref 70–99)
Glucose-Capillary: 132 mg/dL — ABNORMAL HIGH (ref 70–99)

## 2022-04-25 LAB — C-REACTIVE PROTEIN: CRP: 12.3 mg/dL — ABNORMAL HIGH (ref <1.0)

## 2022-04-25 LAB — PROCALCITONIN: Procalcitonin: 3.27 ng/mL

## 2022-04-25 LAB — MAGNESIUM: Magnesium: 1.8 mg/dL (ref 1.7–2.4)

## 2022-04-25 NOTE — TOC Initial Note (Signed)
Transition of Care [facility]) - Initial/Assessment Note    Patient Details  Name: Kathleen Wiley MRN: [id number] Date of Birth: Jul 25, 1946  [facility]) CM/SW Contact:    Carles Collet, RN Phone Number: 04/25/2022, 11:12 AM  Clinical Narrative:                 Damaris Schooner w patient at bedside. She states that she plans to stay with her son in [place] after DC, until she feels well enough to go home. We discussed recs for [facility] services and I requested her to obtain her son's address so this can be arranged. She thinks that she has a RW at home and is going to have her spouse check. Patient currently has proveena VAC.  Informed her TOC would follow up for address and to clarisy if she has a RW at home.     Expected Discharge Plan: [place] Barriers to Discharge: Continued Medical Work up   Patient Goals and CMS Choice Patient states their goals for this hospitalization and ongoing recovery are:: to go to son's house in [place]      Expected Discharge Plan and Services Expected Discharge Plan: [place]   Discharge Planning Services: CM Consult                                          Prior Living Arrangements/Services                       Activities of Daily Living Home Assistive Devices/Equipment: None ADL Screening (condition at time of admission) Patient's cognitive ability adequate to safely complete daily activities?: Yes Is the patient deaf or have difficulty hearing?: No Does the patient have difficulty seeing, even when wearing glasses/contacts?: No Does the patient have difficulty concentrating, remembering, or making decisions?: No Patient able to express need for assistance with ADLs?: No Does the patient have difficulty dressing or bathing?: No Independently performs ADLs?: Yes (appropriate for developmental age) Does the patient have difficulty walking or climbing stairs?: No Weakness of Legs:  Both Weakness of Arms/Hands: None  Permission Sought/Granted                  Emotional Assessment              Admission diagnosis:  Lactic acidosis [E87.20] Cellulitis [L03.90] Cellulitis of right lower extremity [L03.115] Sepsis ([place]) [A41.9] Patient Active Problem List   Diagnosis Date Noted   Group G streptococcal infection 04/21/2022   Sepsis ([place]) 04/20/2022   Cellulitis of right lower extremity 04/20/2022   Gallstone pancreatitis 03/15/2021   Acute cholecystitis 03/15/2021   Controlled type 2 diabetes mellitus with neuropathy ([place]) 03/15/2021   Obesity with body mass index (BMI) of 30.0 to 39.9 03/15/2021   Acute gallstone pancreatitis 03/15/2021   Abnormal cervical Papanicolaou smear 02/18/2021   Abnormal liver function tests 02/18/2021   Acquired hallux valgus 02/18/2021   Acute hypoxemic respiratory failure ([place]) 02/18/2021   Aneurysm of thoracic aorta ([place]) 02/18/2021   Anxiety 02/18/2021   Aortic root dilatation ([place]) 02/18/2021   Aortic valve disorder 02/18/2021   Arthropathy 02/18/2021   Callosity 02/18/2021   Cardiomegaly 02/18/2021   Chest discomfort 02/18/2021   Chronic kidney disease (CKD) stage G2/A1, mildly decreased glomerular filtration rate (GFR) between 60-89 mL/min/1.73 square meter and albuminuria creatinine ratio less  than 30 mg/g 02/18/2021   Chronic kidney disease, stage 2 (mild) 02/18/2021   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   COVID-19 virus infection 02/18/2021   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   Edema 02/18/2021   Edema of both lower legs 02/18/2021   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   Leukopenia 02/18/2021   Low back pain 02/18/2021   Malabsorption syndrome 74/94/4967   Metabolic syndrome 59/16/3846   Mild cervical dysplasia 02/18/2021   Mixed anxiety and depressive disorder 02/18/2021   Neuropathy 02/18/2021   Obstructive sleep apnea syndrome 02/18/2021   Senile purpura ([place]) 02/18/2021    Snoring 02/18/2021   Elevated transaminase level 02/18/2021   Type 2 diabetes mellitus with hyperglycemia ([place]) 02/18/2021   Urge incontinence of urine 02/18/2021   Vitamin B12 deficiency (non anemic) 02/18/2021   Vitamin D deficiency 02/18/2021   Pneumonia due to COVID-19 virus 10/25/2019   Hypokalemia 10/25/2019   Hyperbilirubinemia 10/25/2019   Pre-diabetes    History of total hip arthroplasty, right 09/18/2018   Osteoarthritis of right hip 09/14/2018   Morbid obesity, BMI not known ([place]) 09/17/2016   Stasis ulcer ([place]) 09/17/2016   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 ([place]) 08/29/2016   Cellulitis of left lower leg 08/10/2016   Infection of flexor tendon sheath 06/14/2016   Pain of right thumb 06/14/2016   GERD (gastroesophageal reflux disease) 02/09/2013   Essential hypertension, benign 02/09/2013   Hypercholesterolemia 02/09/2013   Unspecified constipation 02/09/2013   Muscle spasm 02/09/2013   Osteoarthritis of right knee 01/26/2013   Osteoarthritis of left hip 05/10/2012   PCP:  Jonathon Jordan, MD Pharmacy:   [facility], [place] [address] Phone: [phone] Fax: [phone]  [facility] ([facility] ) - [place], [place] - [address] [place] [address] Phone: [phone] Fax: [phone]     Social Determinants of Health (SDOH) Interventions    Readmission Risk Interventions     No data to display

## 2022-04-25 NOTE — Progress Notes (Signed)
Physical Therapy Treatment Patient Details Name: Kathleen Wiley MRN: [id number] DOB: 11-07-1946 Today's Date: 04/25/2022   History of Present Illness Pt adm 6/12 with sepsis due to RLE cellulitis. On 6/16 pt underwent Rt leg debridement with 4 compartment fasciotomy and closure and application of Prevena wound VAC.   PMHx: DM, HTN, HLD, cellulitis    PT Comments    Pt now s/p surgery on RLE on 6/16 so mobility continues to be slow. Expect will progress steadily as pain improves.  Plans to go to son's home initially.  Recommendations for follow up therapy are one component of a multi-disciplinary discharge planning process, led by the attending physician.  Recommendations may be updated based on patient status, additional functional criteria and insurance authorization.  Follow Up Recommendations  Home health PT     Assistance Recommended at Discharge Frequent or constant Supervision/Assistance  Patient can return home with the following A little help with walking and/or transfers;A little help with bathing/dressing/bathroom;Assistance with cooking/housework;Assist for transportation;Help with stairs or ramp for entrance   Equipment Recommendations  Rolling walker (2 wheels)    Recommendations for Other Services       Precautions / Restrictions Precautions Precautions: Fall Restrictions Weight Bearing Restrictions: No     Mobility  Bed Mobility Overal bed mobility: Needs Assistance Bed Mobility: Sidelying to Sit   Sidelying to sit: Mod assist, HOB elevated       General bed mobility comments: Assist to bring RLE off of bed, elevate trunk into sitting, and bring hips to EOB.    Transfers Overall transfer level: Needs assistance Equipment used: Rolling walker (2 wheels) Transfers: Sit to/from Stand, Bed to chair/wheelchair/BSC Sit to Stand: Min assist   Step pivot transfers: Min assist       General transfer comment: Assist to bring hips up. Verbal cues for hand  placement. Bed to chair with RW and pivotal steps.    Ambulation/Gait                   Stairs             Wheelchair Mobility    Modified Rankin (Stroke Patients Only)       Balance Overall balance assessment: Needs assistance Sitting-balance support: No upper extremity supported, Feet supported Sitting balance-Leahy Scale: Fair     Standing balance support: Bilateral upper extremity supported Standing balance-Leahy Scale: Poor Standing balance comment: Walker and min guard for static standing                            Cognition Arousal/Alertness: Awake/alert Behavior During Therapy: WFL for tasks assessed/performed Overall Cognitive Status: Within Functional Limits for tasks assessed                                          Exercises      General Comments        Pertinent Vitals/Pain Pain Assessment Pain Assessment: Faces Faces Pain Scale: Hurts even more Pain Location: R LE with WB/touch Pain Descriptors / Indicators: Throbbing Pain Intervention(s): Limited activity within patient's tolerance, Monitored during session, Repositioned, Patient requesting pain meds-RN notified    Home Living                          Prior Function  PT Goals (current goals can now be found in the care plan section) Acute Rehab PT Goals Patient Stated Goal: go to son's home initially Progress towards PT goals: Not progressing toward goals - comment (Pt with surgery since initial goals set. Goals remain appropriate.)    Frequency    Min 3X/week      PT Plan Current plan remains appropriate    Co-evaluation              AM-PAC PT "6 Clicks" Mobility   Outcome Measure  Help needed turning from your back to your side while in a flat bed without using bedrails?: A Little Help needed moving from lying on your back to sitting on the side of a flat bed without using bedrails?: A Lot Help needed  moving to and from a bed to a chair (including a wheelchair)?: A Little Help needed standing up from a chair using your arms (e.g., wheelchair or bedside chair)?: A Little Help needed to walk in hospital room?: Total Help needed climbing 3-5 steps with a railing? : Total 6 Click Score: 13    End of Session   Activity Tolerance: Patient limited by pain Patient left: in chair;with call bell/phone within reach Nurse Communication: Mobility status;Patient requests pain meds PT Visit Diagnosis: Unsteadiness on feet (R26.81);Muscle weakness (generalized) (M62.81);Pain Pain - Right/Left: Right Pain - part of body: Leg     Time: 1004-1040 PT Time Calculation (min) (ACUTE ONLY): 36 min  Charges:  $Therapeutic Activity: 23-37 mins                     [place] Office [place] 04/25/2022, 12:39 PM

## 2022-04-25 NOTE — Progress Notes (Signed)
PROGRESS NOTE        PATIENT DETAILS Name: Kathleen Wiley Age: 76 y.o. Sex: female Date of Birth: 1945/12/24 Admit Date: 04/19/2022 Admitting Physician Evalee Mutton Kristeen Mans, MD EHU:[id number], Ivin Booty, MD  Brief Summary: Patient is a 76 y.o.  female with history of DM-2, HTN, HLD who presented with fever, vomiting, and RLE erythema swelling-found to have sepsis RLE cellulitis with streptococcal bacteremia.   Significant events: 6/12>> admit to [facility] for RLE swelling/cellulitis.  Significant studies: 6/12>> CT  right tibia/fibula: Diffuse subcutaneous edema/swelling-no evidence of soft tissue gas/abscess. 6/12>> CXR: No PNA 6/13>> CT head: No acute abnormalities 6/13>>RLE doppler-neg for DVT 6/15>> vitamin B12 level: Normal 6/15 >>   TTE -   1. Left ventricular ejection fraction, by estimation, is 60 to 65%. The left ventricle has normal function. The left ventricle has no regional wall motion abnormalities. There is severe concentric left ventricular hypertrophy. Left ventricular diastolic  parameters are indeterminate.  2. Right ventricular systolic function is normal. The right ventricular size is normal.  3. Left atrial size was moderately dilated.  4. The mitral valve is normal in structure. No evidence of mitral valve regurgitation. No evidence of mitral stenosis.  5. The aortic valve is tricuspid. There is moderate calcification of the aortic valve. There is moderate thickening of the aortic valve. Aortic valve regurgitation is not visualized. Aortic valve sclerosis/calcification is present, without any evidence of aortic stenosis.  6. 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  7. The inferior vena cava is normal in size with greater than 50% respiratory variability, suggesting right atrial pressure of 3 mmHg. Conclusion(s)/Recommendation(s): No evidence of valvular  vegetations on this transthoracic echocardiogram. Consider a transesophageal echocardiogram to exclude infective endocarditis if clinically indicated.  6/15 >> MRI    1. Redemonstration of diffuse anterolateral and anteromedial subcutaneous edema and swelling of the right calf. Findings are again compatible with cellulitis. Again no walled-off abscess or deep fascial plane fluid is seen. 2. No acute bone abnormality. 3. 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  04/23/2022. right lower extremity debridement and removal of nonviable tissue by Dr. Sharol Given.   Significant microbiology data: 6/12>> COVID/influenza PCR: Negative 6/12>> urine culture: Klebsiella (colonization) 6/12>> blood culture: 1/2-Streptococcus group G 6/14>> blood culture: No growth  Procedures:    Consults: ID, Ortho  Subjective:  Patient in bed, appears comfortable, denies any headache, no fever, no chest pain or pressure, no shortness of breath , no abdominal pain. No new focal weakness.  Objective: Vitals: Blood pressure (!) 128/57, pulse 90, temperature 98.1 F (36.7 C), temperature source Oral, resp. rate 15, height '5\' 4"'$  (1.626 m), weight 106.6 kg, SpO2 96 %.   Exam:  Awake Alert, No new F.N deficits, Normal affect [place].AT,PERRAL Supple Neck, No JVD,   Symmetrical Chest wall movement, Good air movement bilaterally, CTAB RRR,No Gallops, Rubs or new Murmurs,  +ve B.Sounds, Abd Soft, No tenderness,   Right lower extremity under bandage with wound VAC   Assessment/Plan:  Sepsis due to RLE cellulitis and streptococcal bacteremia: Although sepsis physiology better-no fever-BP stable-she continues to have persistent RLE pain and leukopenia/thrombocytopenia. ID following- she was on penicillin/Zyvox, Zyvox stopped by ID on 04/24/22, per ID continue IV penicillin here and then discharged on 1 week of oral  Augmentin-repeat cultures negative-unremarkable TTE.  CT and MRI of the right lower extremity failed to  show any abscess however on exam the leg looked extremely angry, he was taken to the OR by Dr. Sharol Given on 04/23/2022 and underwent debridement and removal of nonviable tissue and dark serosanguineous fluid.  Intraoperative wound cultures pending.  Acute metabolic encephalopathy: Resolved-likely due to sepsis.  She continues to be completely awake and alert.  AKI: Improved-likely hemodynamically mediated.  Follow periodically.  Pancytopenia: Suspect due to sepsis/infection-B12 level stable-ferritin count significantly elevated-reflecting acute phase reactant.  Hold SQ heparin-continue to monitor CBC.  Recent PT/INR was negative.  No signs of bleeding.  Follow CBC-repeat INR, question if the Zyvox is contributing to some of it.  Overall counts have stabilized.  HTN: Apparently not on any antihypertensives at home-BP was soft yesterday-today more stable-watch closely.  HLD: Continue statin  Depression/anxiety: Resume Lexapro  DM-2 (A1c 5.2 on 6/30): Continue SSI-resume Ozempic on discharge.  Recent Labs    04/24/22 1616 04/24/22 2104 04/25/22 0802  GLUCAP 134* 119* 142*     Morbid Obesity Estimated body mass index is 40.34 kg/m as calculated from the following:   Height as of this encounter: '5\' 4"'$  (1.626 m).   Weight as of this encounter: 106.6 kg.   Code status:   Code Status: Full Code   DVT Prophylaxis:    Family Communication: Spouse updated on 6/14-no family at bedside on 6/15.   Disposition Plan: Status is: Inpatient   Planned Discharge Destination:Home   Diet: Diet Order             Diet Carb Modified Fluid consistency: Thin; Room service appropriate? Yes  Diet effective now                    MEDICATIONS: Scheduled Meds:  acetaminophen  1,000 mg Oral Q8H   escitalopram  20 mg Oral Daily   ferrous sulfate  325 mg Oral BID WC   folic acid  1 mg Oral Daily   gabapentin  300 mg Oral TID   insulin aspart  0-9 Units Subcutaneous TID WC   rosuvastatin  5 mg  Oral Daily   cyanocobalamin  1,000 mcg Oral Daily   Continuous Infusions:  penicillin G potassium 12 Million Units in dextrose 5 % 500 mL continuous infusion 12 Million Units (04/25/22 0000)   PRN Meds:.morphine injection, ondansetron (ZOFRAN) IV, oxyCODONE   I have personally reviewed following labs and imaging studies  LABORATORY DATA:  Recent Labs  Lab 04/20/22 0150 04/21/22 0056 04/22/22 0041 04/23/22 0108 04/24/22 0127 04/25/22 0243  WBC 4.3 2.6* 2.4* 2.5* 2.6* 2.8*  HGB 12.4 10.0* 9.6* 9.1* 8.9* 8.9*  HCT 37.8 29.6* 28.7* 27.3* 27.2* 27.0*  PLT 78* 60* 53* 58* 71* 84*  MCV 103.0* 97.7 99.3 98.6 101.5* 99.6  MCH 33.8 33.0 33.2 32.9 33.2 32.8  MCHC 32.8 33.8 33.4 33.3 32.7 33.0  RDW 14.5 14.8 14.7 14.5 14.6 14.6  LYMPHSABS 0.3*  --  0.5* 0.6* 0.7 1.0  MONOABS 0.3  --  0.2 0.2 0.3 0.2  EOSABS 0.0  --  0.0 0.1 0.1 0.1  BASOSABS 0.1  --  0.0 0.0 0.0 0.0    Recent Labs  Lab 04/19/22 2033 04/19/22 2226 04/20/22 0150 04/20/22 0605 04/21/22 0056 04/22/22 0041 04/23/22 0108 04/24/22 0127 04/25/22 0243  NA  --   --  135  --  134* 136 135 133* 136  K  --   --  3.8  --  3.4* 3.9 3.9 3.9 4.0  CL  --   --  99  --  102 105 103 103 103  CO2  --   --  23  --  '22 23 23 24 23  '$ GLUCOSE  --   --  135*  --  104* 128* 116* 139* 116*  BUN  --   --  22  --  25* '19 15 12 9  '$ CREATININE  --   --  1.40*  --  1.11* 0.95 0.83 0.72 0.73  CALCIUM  --   --  8.8*  --  7.7* 7.9* 8.2* 7.5* 7.7*  AST  --   --  33  --  35 '27 21 17 16  '$ ALT  --   --  26  --  '22 21 16 16 15  '$ ALKPHOS  --   --  62  --  47 54 64 76 87  BILITOT  --   --  2.0*  --  1.3* 1.6* 1.8* 1.1 1.2  ALBUMIN  --   --  3.4*  --  2.7* 2.5* 2.3* 2.1* 2.0*  MG  --   --   --   --   --  1.6* 2.2 2.0 1.8  CRP  --   --   --   --   --   --   --  16.0* 12.3*  PROCALCITON  --   --   --   --   --   --  11.24 5.61 3.27  LATICACIDVEN 5.2* 6.7* 4.2* 3.8*  --   --   --   --   --   INR 1.2  --   --   --   --   --  1.1  --   --   HGBA1C  --    --  5.2  --   --   --   --   --   --      RADIOLOGY STUDIES/RESULTS: No results found.   LOS: 5 days   Signature  Lala Lund M.D on 04/25/2022 at 8:51 AM   -  To page go to [email or web address]

## 2022-04-26 DIAGNOSIS — B954 Other streptococcus as the cause of diseases classified elsewhere: Secondary | ICD-10-CM | POA: Diagnosis not present

## 2022-04-26 LAB — COMPREHENSIVE METABOLIC PANEL
ALT: 13 U/L (ref 0–44)
AST: 14 U/L — ABNORMAL LOW (ref 15–41)
Albumin: 1.8 g/dL — ABNORMAL LOW (ref 3.5–5.0)
Alkaline Phosphatase: 95 U/L (ref 38–126)
Anion gap: 8 (ref 5–15)
BUN: 9 mg/dL (ref 8–23)
CO2: 25 mmol/L (ref 22–32)
Calcium: 7.6 mg/dL — ABNORMAL LOW (ref 8.9–10.3)
Chloride: 102 mmol/L (ref 98–111)
Creatinine, Ser: 0.7 mg/dL (ref 0.44–1.00)
GFR, Estimated: >60 mL/min (ref >60)
Glucose, Bld: 127 mg/dL — ABNORMAL HIGH (ref 70–99)
Potassium: 4 mmol/L (ref 3.5–5.1)
Sodium: 135 mmol/L (ref 135–145)
Total Bilirubin: 1.1 mg/dL (ref 0.3–1.2)
Total Protein: 4.9 g/dL — ABNORMAL LOW (ref 6.5–8.1)

## 2022-04-26 LAB — GLUCOSE, CAPILLARY
Glucose-Capillary: 148 mg/dL — ABNORMAL HIGH (ref 70–99)
Glucose-Capillary: 117 mg/dL — ABNORMAL HIGH (ref 70–99)
Glucose-Capillary: 153 mg/dL — ABNORMAL HIGH (ref 70–99)
Glucose-Capillary: 150 mg/dL — ABNORMAL HIGH (ref 70–99)

## 2022-04-26 LAB — CBC WITH DIFFERENTIAL/PLATELET
Abs Immature Granulocytes: 0.09 10*3/uL — ABNORMAL HIGH (ref 0.00–0.07)
Basophils Absolute: 0 10*3/uL (ref 0.0–0.1)
Basophils Relative: 1 %
Eosinophils Absolute: 0.1 10*3/uL (ref 0.0–0.5)
Eosinophils Relative: 2 %
HCT: 26.9 % — ABNORMAL LOW (ref 36.0–46.0)
Hemoglobin: 8.6 g/dL — ABNORMAL LOW (ref 12.0–15.0)
Immature Granulocytes: 3 %
Lymphocytes Relative: 31 %
Lymphs Abs: 1 10*3/uL (ref 0.7–4.0)
MCH: 32.2 pg (ref 26.0–34.0)
MCHC: 32 g/dL (ref 30.0–36.0)
MCV: 100.7 fL — ABNORMAL HIGH (ref 80.0–100.0)
Monocytes Absolute: 0.3 10*3/uL (ref 0.1–1.0)
Monocytes Relative: 9 %
Neutro Abs: 1.8 10*3/uL (ref 1.7–7.7)
Neutrophils Relative %: 54 %
Platelets: 94 10*3/uL — ABNORMAL LOW (ref 150–400)
RBC: 2.67 MIL/uL — ABNORMAL LOW (ref 3.87–5.11)
RDW: 14.6 % (ref 11.5–15.5)
WBC: 3.3 10*3/uL — ABNORMAL LOW (ref 4.0–10.5)
nRBC: 0 % (ref 0.0–0.2)

## 2022-04-26 LAB — CULTURE, BLOOD (ROUTINE X 2)
Culture: NO GROWTH
Culture: NO GROWTH
Special Requests: ADEQUATE

## 2022-04-26 LAB — C-REACTIVE PROTEIN: CRP: 14.6 mg/dL — ABNORMAL HIGH (ref <1.0)

## 2022-04-26 LAB — PROCALCITONIN: Procalcitonin: 1.8 ng/mL

## 2022-04-26 LAB — MAGNESIUM: Magnesium: 1.9 mg/dL (ref 1.7–2.4)

## 2022-04-26 MED ORDER — DOCUSATE SODIUM 100 MG PO CAPS
200.0000 mg | ORAL_CAPSULE | Freq: Two times a day (BID) | ORAL | Status: DC
  Administered 2022-04-26 – 2022-04-27 (×3): 200 mg via ORAL
  Filled 2022-04-26 (×3): qty 2

## 2022-04-26 MED ORDER — LACTULOSE 10 GM/15ML PO SOLN
20.0000 g | Freq: Two times a day (BID) | ORAL | Status: AC
  Administered 2022-04-26 (×2): 20 g via ORAL
  Filled 2022-04-26 (×2): qty 30

## 2022-04-26 MED ORDER — POLYETHYLENE GLYCOL 3350 17 G PO PACK
17.0000 g | PACK | Freq: Two times a day (BID) | ORAL | Status: DC
  Administered 2022-04-26 – 2022-04-27 (×3): 17 g via ORAL
  Filled 2022-04-26 (×3): qty 1

## 2022-04-26 MED ORDER — MAGNESIUM HYDROXIDE 400 MG/5ML PO SUSP
30.0000 mL | Freq: Once | ORAL | Status: AC
  Administered 2022-04-26: 30 mL via ORAL
  Filled 2022-04-26: qty 30

## 2022-04-26 NOTE — Progress Notes (Signed)
Physical Therapy Treatment Patient Details Name: Kathleen Wiley MRN: [id number] DOB: 08-10-1946 Today's Date: 04/26/2022   History of Present Illness Pt adm 6/12 with sepsis due to RLE cellulitis. On 6/16 pt underwent Rt leg debridement with 4 compartment fasciotomy and closure and application of Prevena wound VAC.   PMHx: DM, HTN, HLD, cellulitis    PT Comments    Patient progressing slowly towards PT goals. Pt self limiting today due to pain and feeling "woozy"with movement. Requires Mod A for standing and Min A for gait training with use of RW for support. Pt needs 1 seated rest break due to above. Encouraged OOB and walking as much as tolerated as well as AROM of RLE. Recommend hands on assist at home for all mobility and safety. Will follow.    Recommendations for follow up therapy are one component of a multi-disciplinary discharge planning process, led by the attending physician.  Recommendations may be updated based on patient status, additional functional criteria and insurance authorization.  Follow Up Recommendations  Home health PT     Assistance Recommended at Discharge Frequent or constant Supervision/Assistance  Patient can return home with the following A little help with bathing/dressing/bathroom;Assistance with cooking/housework;Assist for transportation;Help with stairs or ramp for entrance;A lot of help with walking and/or transfers   Equipment Recommendations  Rolling walker (2 wheels)    Recommendations for Other Services       Precautions / Restrictions Precautions Precautions: Fall;Other (comment) Precaution Comments: needing 02 at night per RN, wound vac Restrictions Weight Bearing Restrictions: No     Mobility  Bed Mobility Overal bed mobility: Needs Assistance Bed Mobility: Supine to Sit     Supine to sit: Mod assist, HOB elevated     General bed mobility comments: Mod A to bring bottom to EOB with increased time and cues to reach for rail for  support, assist with RLE as well.    Transfers Overall transfer level: Needs assistance Equipment used: Rolling walker (2 wheels) Transfers: Sit to/from Stand Sit to Stand: Mod assist           General transfer comment: Mod A to power to standing with cues for hand placement, technique and use of momentum. Stood from [employer], from chair x1, posterior bias,.    Ambulation/Gait Ambulation/Gait assistance: Min assist Gait Distance (Feet): 6 Feet (+ 4') Assistive device: Rolling walker (2 wheels) Gait Pattern/deviations: Step-to pattern, Decreased stride length, Wide base of support, Trunk flexed Gait velocity: reduced Gait velocity interpretation: <1.31 ft/sec, indicative of household ambulator   General Gait Details: Slow, step to gait with increased WB through BUEs and decrease stance time through RLE. 1 seated rest break due to fatigue/weakness/pain. VSS on RA.   Stairs             Wheelchair Mobility    Modified Rankin (Stroke Patients Only)       Balance Overall balance assessment: Needs assistance Sitting-balance support: No upper extremity supported, Feet supported Sitting balance-Leahy Scale: Fair     Standing balance support: During functional activity Standing balance-Leahy Scale: Poor Standing balance comment: Requires UE support in standing with Min A at times due to posterior lean                            Cognition Arousal/Alertness: Awake/alert Behavior During Therapy: WFL for tasks assessed/performed Overall Cognitive Status: Impaired/Different from baseline Area of Impairment: Problem solving, Memory  Memory: Decreased short-term memory       Problem Solving: Slow processing          Exercises      General Comments General comments (skin integrity, edema, etc.): Spouse stepped out of room for session. BP dropped during activity from 300 to 923 systolic with reports of wooziness.       Pertinent Vitals/Pain Pain Assessment Pain Assessment: 0-10 Pain Score: 10-Worst pain ever Pain Location: R LE Pain Descriptors / Indicators: Throbbing, Discomfort, Sore, Grimacing Pain Intervention(s): Monitored during session, Premedicated before session, Limited activity within patient's tolerance, Repositioned    Home Living                          Prior Function            PT Goals (current goals can now be found in the care plan section) Progress towards PT goals: Progressing toward goals (slowly)    Frequency    Min 3X/week      PT Plan Current plan remains appropriate    Co-evaluation              AM-PAC PT "6 Clicks" Mobility   Outcome Measure  Help needed turning from your back to your side while in a flat bed without using bedrails?: A Little Help needed moving from lying on your back to sitting on the side of a flat bed without using bedrails?: A Lot Help needed moving to and from a bed to a chair (including a wheelchair)?: A Lot Help needed standing up from a chair using your arms (e.g., wheelchair or bedside chair)?: A Lot Help needed to walk in hospital room?: Total Help needed climbing 3-5 steps with a railing? : Total 6 Click Score: 11    End of Session Equipment Utilized During Treatment: Gait belt Activity Tolerance: Patient limited by pain;Treatment limited secondary to medical complications (Comment) ("wooziness) Patient left: in chair;with call bell/phone within reach;with family/visitor present Nurse Communication: Mobility status PT Visit Diagnosis: Unsteadiness on feet (R26.81);Muscle weakness (generalized) (M62.81);Pain Pain - Right/Left: Right Pain - part of body: Leg     Time: 3007-6226 PT Time Calculation (min) (ACUTE ONLY): 27 min  Charges:  $Gait Training: 23-37 mins                     Marisa Severin, PT, DPT Acute Rehabilitation Services Secure chat preferred Office [place] 04/26/2022, 4:10 PM

## 2022-04-26 NOTE — Progress Notes (Signed)
Patient - Richel has consistently had a temperature of 99.1. Leda Gauze and I decided to hold AM dose of tylenol, gave oxycodone instead.  I gave patient an incentive spirometer and educated on use.

## 2022-04-26 NOTE — Progress Notes (Signed)
PROGRESS NOTE        PATIENT DETAILS Name: Kathleen Wiley Age: 76 y.o. Sex: female Date of Birth: February 10, 1946 Admit Date: 04/19/2022 Admitting Physician Evalee Mutton Kristeen Mans, MD ZCH:[id number], Ivin Booty, MD  Brief Summary: Patient is a 76 y.o.  female with history of DM-2, HTN, HLD who presented with fever, vomiting, and RLE erythema swelling-found to have sepsis RLE cellulitis with streptococcal bacteremia.   Significant events: 6/12>> admit to [facility] for RLE swelling/cellulitis.  Significant studies: 6/12>> CT  right tibia/fibula: Diffuse subcutaneous edema/swelling-no evidence of soft tissue gas/abscess. 6/12>> CXR: No PNA 6/13>> CT head: No acute abnormalities 6/13>>RLE doppler-neg for DVT 6/15>> vitamin B12 level: Normal 6/15 >>   TTE -   1. Left ventricular ejection fraction, by estimation, is 60 to 65%. The left ventricle has normal function. The left ventricle has no regional wall motion abnormalities. There is severe concentric left ventricular hypertrophy. Left ventricular diastolic  parameters are indeterminate.  2. Right ventricular systolic function is normal. The right ventricular size is normal.  3. Left atrial size was moderately dilated.  4. The mitral valve is normal in structure. No evidence of mitral valve regurgitation. No evidence of mitral stenosis.  5. The aortic valve is tricuspid. There is moderate calcification of the aortic valve. There is moderate thickening of the aortic valve. Aortic valve regurgitation is not visualized. Aortic valve sclerosis/calcification is present, without any evidence of aortic stenosis.  6. 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  7. The inferior vena cava is normal in size with greater than 50% respiratory variability, suggesting right atrial pressure of 3 mmHg. Conclusion(s)/Recommendation(s): No evidence of valvular  vegetations on this transthoracic echocardiogram. Consider a transesophageal echocardiogram to exclude infective endocarditis if clinically indicated.  6/15 >> MRI    1. Redemonstration of diffuse anterolateral and anteromedial subcutaneous edema and swelling of the right calf. Findings are again compatible with cellulitis. Again no walled-off abscess or deep fascial plane fluid is seen. 2. No acute bone abnormality. 3. 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  04/23/2022. right lower extremity debridement and removal of nonviable tissue by Dr. Sharol Given.   Significant microbiology data: 6/12>> COVID/influenza PCR: Negative 6/12>> urine culture: Klebsiella (colonization) 6/12>> blood culture: 1/2-Streptococcus group G 6/14>> blood culture: No growth  Procedures:    Consults: ID, Ortho  Subjective: Patient in bed, appears comfortable, denies any headache, no fever, no chest pain or pressure, no shortness of breath , no abdominal pain. No focal weakness.  Still has some discomfort in the right lower extremity but improving.  Objective: Vitals: Blood pressure (!) 140/49, pulse 97, temperature 98.7 F (37.1 C), temperature source Oral, resp. rate 20, height '5\' 4"'$  (1.626 m), weight 106.6 kg, SpO2 98 %.   Exam:  Awake Alert, No new F.N deficits, Normal affect .AT,PERRAL Supple Neck, No JVD,   Symmetrical Chest wall movement, Good air movement bilaterally, CTAB RRR,No Gallops, Rubs or new Murmurs,  +ve B.Sounds, Abd Soft, No tenderness,   Right lower extremity under bandage with wound with negative pressure drain bulb in place   Assessment/Plan:  Sepsis due to RLE cellulitis and streptococcal bacteremia: Although sepsis physiology better-no fever-BP stable-she continues to have persistent RLE pain and leukopenia/thrombocytopenia. ID following- she was on penicillin/Zyvox, Zyvox stopped by ID  on 04/24/22, per ID continue IV penicillin here and then discharged on 1 week of  oral Augmentin-repeat cultures negative-unremarkable TTE.  CT and MRI of the right lower extremity failed to show any abscess however on exam the leg looked extremely angry, he was taken to the OR by Dr. Sharol Given on 04/23/2022 and underwent debridement and removal of nonviable tissue and dark serosanguineous fluid.  Intraoperative wound cultures pending negative thus far on 04/26/2022. Case discussed with Dr. Sharol Given on 04/26/2022 if stable then discharge home on 04/27/2022 with present bandage, first dressing change with Dr. Sharol Given in his office.   Acute metabolic encephalopathy: Resolved-likely due to sepsis.  She continues to be completely awake and alert.  AKI: Improved-likely hemodynamically mediated.  Follow periodically.  Pancytopenia: Suspect due to sepsis/infection-B12 level stable-ferritin count significantly elevated-reflecting acute phase reactant.  Hold SQ heparin-continue to monitor CBC.  Recent PT/INR was negative.  No signs of bleeding.  Follow CBC-repeat INR, question if the Zyvox is contributing to some of it.  Overall counts have stabilized and now gradually improving, also Zyvox was stopped on 04/24/2022 by ID.  HTN: Apparently not on any antihypertensives at home-BP was soft yesterday-today more stable-watch closely.  HLD: Continue statin  Depression/anxiety: Resume Lexapro  DM-2 (A1c 5.2 on 6/30): Continue SSI-resume Ozempic on discharge.  Recent Labs    04/25/22 1649 04/25/22 2134 04/26/22 0821  GLUCAP 149* 132* 148*     Morbid Obesity Estimated body mass index is 40.34 kg/m as calculated from the following:   Height as of this encounter: '5\' 4"'$  (1.626 m).   Weight as of this encounter: 106.6 kg.   Code status:   Code Status: Full Code   DVT Prophylaxis:    Family Communication: Spouse updated on 6/14-no family at bedside on 6/15.   Disposition Plan: Status is: Inpatient   Planned Discharge Destination:Home   Diet: Diet Order             Diet Carb Modified  Fluid consistency: Thin; Room service appropriate? Yes  Diet effective now                    MEDICATIONS: Scheduled Meds:  acetaminophen  1,000 mg Oral Q8H   escitalopram  20 mg Oral Daily   ferrous sulfate  325 mg Oral BID WC   folic acid  1 mg Oral Daily   gabapentin  300 mg Oral TID   insulin aspart  0-9 Units Subcutaneous TID WC   rosuvastatin  5 mg Oral Daily   cyanocobalamin  1,000 mcg Oral Daily   Continuous Infusions:  penicillin G potassium 12 Million Units in dextrose 5 % 500 mL continuous infusion 12 Million Units (04/26/22 0716)   PRN Meds:.ondansetron (ZOFRAN) IV, oxyCODONE   I have personally reviewed following labs and imaging studies  LABORATORY DATA:  Recent Labs  Lab 04/22/22 0041 04/23/22 0108 04/24/22 0127 04/25/22 0243 04/26/22 0232  WBC 2.4* 2.5* 2.6* 2.8* 3.3*  HGB 9.6* 9.1* 8.9* 8.9* 8.6*  HCT 28.7* 27.3* 27.2* 27.0* 26.9*  PLT 53* 58* 71* 84* 94*  MCV 99.3 98.6 101.5* 99.6 100.7*  MCH 33.2 32.9 33.2 32.8 32.2  MCHC 33.4 33.3 32.7 33.0 32.0  RDW 14.7 14.5 14.6 14.6 14.6  LYMPHSABS 0.5* 0.6* 0.7 1.0 1.0  MONOABS 0.2 0.2 0.3 0.2 0.3  EOSABS 0.0 0.1 0.1 0.1 0.1  BASOSABS 0.0 0.0 0.0 0.0 0.0    Recent Labs  Lab 04/19/22 2033 04/19/22 2226 04/20/22 0150 04/20/22 2703  04/21/22 0056 04/22/22 0041 04/23/22 0108 04/24/22 0127 04/25/22 0243 04/26/22 0232  NA  --   --  135  --    < > 136 135 133* 136 135  K  --   --  3.8  --    < > 3.9 3.9 3.9 4.0 4.0  CL  --   --  99  --    < > 105 103 103 103 102  CO2  --   --  23  --    < > '23 23 24 23 25  '$ GLUCOSE  --   --  135*  --    < > 128* 116* 139* 116* 127*  BUN  --   --  22  --    < > '19 15 12 9 9  '$ CREATININE  --   --  1.40*  --    < > 0.95 0.83 0.72 0.73 0.70  CALCIUM  --   --  8.8*  --    < > 7.9* 8.2* 7.5* 7.7* 7.6*  AST  --   --  33  --    < > '27 21 17 16 '$ 14*  ALT  --   --  26  --    < > '21 16 16 15 13  '$ ALKPHOS  --   --  62  --    < > 54 64 76 87 95  BILITOT  --   --  2.0*  --    <  > 1.6* 1.8* 1.1 1.2 1.1  ALBUMIN  --   --  3.4*  --    < > 2.5* 2.3* 2.1* 2.0* 1.8*  MG  --   --   --   --   --  1.6* 2.2 2.0 1.8 1.9  CRP  --   --   --   --   --   --   --  16.0* 12.3* 14.6*  PROCALCITON  --   --   --   --   --   --  11.24 5.61 3.27 1.80  LATICACIDVEN 5.2* 6.7* 4.2* 3.8*  --   --   --   --   --   --   INR 1.2  --   --   --   --   --  1.1  --   --   --   HGBA1C  --   --  5.2  --   --   --   --   --   --   --    < > = values in this interval not displayed.     RADIOLOGY STUDIES/RESULTS: No results found.   LOS: 6 days   Signature  Lala Lund M.D on 04/26/2022 at 10:19 AM   -  To page go to [email or web address]

## 2022-04-26 NOTE — TOC Progression Note (Addendum)
Transition of Care [facility]) - Progression Note    Patient Details  Name: Kathleen Wiley MRN: [id number] Date of Birth: 1946/03/06  Transition of [facility]) CM/SW Contact  Bartholomew Crews, RN Phone Number: [phone] 04/26/2022, 12:30 PM  Clinical Narrative:     Spoke with patient on her cell phone [phone] to discuss post acute transition. She will be staying with her son, Kathleen Wiley, in [place]. Her dil, Kathleen Wiley, will provide transportation home. Patient does not know specific address, but has reached out to [place] and will advise RNCM when she has the information. Discussed DME needs - referral to [employer] for RW and 3n1 for delivery to the room tomorrow. Discussed choice for [facility] agencies - will seek agencies that cover the [place] area.   UPDATE 1500: Unable to find an accepting [facility] agency at this time  [facility] - declined [facility] - declined [facility] - declined, staffing [employer] - declined [facility] - declined, out of coverage area [place] - declined, Publishing [employer] - declined [employer] - declined Futures trader - faxed referral, pending [place] staffing [place] - out of coverage area [facility] [employer] - out of coverage area [facility] - out of coverage area  Attempted to reach patient by cell phone and hospital room phone unsuccessfully to follow up with contact information for her son. 1 voicemail left on cell phone with contact information.  Expected Discharge Plan: [place] Barriers to Discharge: Continued Medical Work up  Expected Discharge Plan and Services Expected Discharge Plan: [place]   Discharge Planning Services: CM Consult                     DME Arranged: 3-N-1, [place] rolling DME Agency: [employer] Date DME Agency Contacted: 04/26/22 Time DME Agency Contacted: 1229 Representative spoke with at DME Agency: Jodell Cipro HH Arranged: PT, OT, Nurse's Aide            Social Determinants of Health (SDOH) Interventions    Readmission Risk Interventions     No data to display

## 2022-04-26 NOTE — Progress Notes (Signed)
Occupational Therapy Treatment Patient Details Name: Kathleen Wiley MRN: [id number] DOB: June 15, 1946 Today's Date: 04/26/2022   History of present illness Pt adm 6/12 with sepsis due to RLE cellulitis. On 6/16 pt underwent Rt leg debridement with 4 compartment fasciotomy and closure and application of Prevena wound VAC.   PMHx: DM, HTN, HLD, cellulitis   OT comments  Pt reported 7/10 pain, declined standing and transfer to chair. RN notified and provided pain meds. Pt completed seated grooming activities with set up.     Recommendations for follow up therapy are one component of a multi-disciplinary discharge planning process, led by the attending physician.  Recommendations may be updated based on patient status, additional functional criteria and insurance authorization.    Follow Up Recommendations  Home health OT    Assistance Recommended at Discharge Intermittent Supervision/Assistance  Patient can return home with the following  A lot of help with bathing/dressing/bathroom;A lot of help with walking and/or transfers;Assistance with cooking/housework;Assist for transportation;Help with stairs or ramp for entrance   Equipment Recommendations  BSC/3in1 Librarian, academic)    Recommendations for Other Services      Precautions / Restrictions Precautions Precautions: Fall Precaution Comments: needing 02 at night per RN       Mobility Bed Mobility Overal bed mobility: Needs Assistance Bed Mobility: Supine to Sit, Sit to Supine     Supine to sit: Mod assist, HOB elevated Sit to supine: Mod assist   General bed mobility comments: Assist for RLE off of bed, elevate trunk into sitting, and for hips to EOB, assist for LEs back into bed.    Transfers                   General transfer comment: pt declined OOB to chair citing R LE pain     Balance Overall balance assessment: Needs assistance   Sitting balance-Leahy Scale: Fair                                      ADL either performed or assessed with clinical judgement   ADL Overall ADL's : Needs assistance/impaired     Grooming: Wash/dry hands;Wash/dry face;Oral care;Sitting;Supervision/safety                                      Extremity/Trunk Assessment              Vision       Perception     Praxis      Cognition Arousal/Alertness: Awake/alert Behavior During Therapy: WFL for tasks assessed/performed Overall Cognitive Status: Impaired/Different from baseline Area of Impairment: Problem solving, Memory                     Memory: Decreased short-term memory       Problem Solving: Slow processing          Exercises      Shoulder Instructions       General Comments      Pertinent Vitals/ Pain       Pain Assessment Pain Assessment: 0-10 Pain Score: 7  Pain Location: R LE Pain Descriptors / Indicators: Throbbing Pain Intervention(s): Repositioned, Patient requesting pain meds-RN notified, RN gave pain meds during session  Home Living  Prior Functioning/Environment              Frequency  Min 2X/week        Progress Toward Goals  OT Goals(current goals can now be found in the care plan section)  Progress towards OT goals: Progressing toward goals  Acute Rehab OT Goals OT Goal Formulation: With patient/family Time For Goal Achievement: 05/06/22 Potential to Achieve Goals: Good  Plan Discharge plan needs to be updated    Co-evaluation                 AM-PAC OT "6 Clicks" Daily Activity     Outcome Measure   Help from another person eating meals?: None Help from another person taking care of personal grooming?: A Little Help from another person toileting, which includes using toliet, bedpan, or urinal?: A Little Help from another person bathing (including washing, rinsing, drying)?: A Lot Help from another person to put on and taking  off regular upper body clothing?: A Little Help from another person to put on and taking off regular lower body clothing?: A Lot 6 Click Score: 17    End of Session    OT Visit Diagnosis: Unsteadiness on feet (R26.81);Other abnormalities of gait and mobility (R26.89);Pain   Activity Tolerance Patient tolerated treatment well   Patient Left in bed;with call bell/phone within reach;with nursing/sitter in room;with family/visitor present   Nurse Communication Patient requests pain meds        Time: 3267-1245 OT Time Calculation (min): 15 min  Charges: OT General Charges $OT Visit: 1 Visit OT Treatments $Self Care/Home Management : 8-22 mins  Cleta Alberts, OTR/L Acute Rehabilitation Services Office: [phone]   Malka So 04/26/2022, 11:30 AM

## 2022-04-27 ENCOUNTER — Other Ambulatory Visit (HOSPITAL_COMMUNITY): Payer: Self-pay

## 2022-04-27 DIAGNOSIS — B954 Other streptococcus as the cause of diseases classified elsewhere: Secondary | ICD-10-CM | POA: Diagnosis not present

## 2022-04-27 LAB — GLUCOSE, CAPILLARY
Glucose-Capillary: 159 mg/dL — ABNORMAL HIGH (ref 70–99)
Glucose-Capillary: 95 mg/dL (ref 70–99)

## 2022-04-27 MED ORDER — OXYCODONE HCL 5 MG PO TABS
5.0000 mg | ORAL_TABLET | Freq: Four times a day (QID) | ORAL | 0 refills | Status: DC | PRN
  Filled 2022-04-27: qty 15, 4d supply, fill #0

## 2022-04-27 MED ORDER — AMOXICILLIN 500 MG PO CAPS
1000.0000 mg | ORAL_CAPSULE | Freq: Three times a day (TID) | ORAL | 0 refills | Status: DC
  Filled 2022-04-27: qty 56, 7d supply, fill #0

## 2022-04-27 MED ORDER — POLYETHYLENE GLYCOL 3350 17 GM/SCOOP PO POWD
17.0000 g | Freq: Every day | ORAL | 0 refills | Status: DC | PRN
  Filled 2022-04-27: qty 238, 14d supply, fill #0

## 2022-04-27 NOTE — Discharge Summary (Addendum)
Kathleen Wiley:[id number] DOB: 01-07-1946 DOA: 04/19/2022  PCP: Jonathon Jordan, MD  Admit date: 04/19/2022  Discharge date: 04/27/2022  Admitted From: Home   Disposition:  Home   Recommendations for Outpatient Follow-up:   Follow up with PCP in 1-2 weeks  PCP Please obtain BMP/CBC, 2 view CXR in 1week,  (see Discharge instructions)   PCP Please follow up on the following pending results:    Home Health: PT, RN if qualifies   Equipment/Devices: W.VAc  Consultations: ID, orthopedics Discharge Condition: Stable    CODE STATUS: Full    Diet Recommendation: Heart Healthy Low Carb    Chief Complaint  Patient presents with   Nausea     Brief history of present illness from the day of admission and additional interim summary    76 y.o.  female with history of DM-2, HTN, HLD who presented with fever, vomiting, and RLE erythema swelling-found to have sepsis RLE cellulitis with streptococcal bacteremia.     Significant events: 6/12>> admit to [facility] for RLE swelling/cellulitis.   Significant studies: 6/12>> CT  right tibia/fibula: Diffuse subcutaneous edema/swelling-no evidence of soft tissue gas/abscess. 6/12>> CXR: No PNA 6/13>> CT head: No acute abnormalities 6/13>>RLE doppler-neg for DVT 6/15>> vitamin B12 level: Normal 6/15 >>    TTE -    1. Left ventricular ejection fraction, by estimation, is 60 to 65%. The left ventricle has normal function. The left ventricle has no regional wall motion abnormalities. There is severe concentric left ventricular hypertrophy. Left ventricular diastolic  parameters are indeterminate.  2. Right ventricular systolic function is normal. The right ventricular size is normal.  3. Left atrial size was moderately dilated.  4. The mitral valve is normal in structure. No  evidence of mitral valve regurgitation. No evidence of mitral stenosis.  5. The aortic valve is tricuspid. There is moderate calcification of the aortic valve. There is moderate thickening of the aortic valve. Aortic valve regurgitation is not visualized. Aortic valve sclerosis/calcification is present, without any evidence of aortic stenosis.  6. 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  7. The inferior vena cava is normal in size with greater than 50% respiratory variability, suggesting right atrial pressure of 3 mmHg. Conclusion(s)/Recommendation(s): No evidence of valvular vegetations on this transthoracic echocardiogram. Consider a transesophageal echocardiogram to exclude infective endocarditis if clinically indicated.   6/15 >> MRI     1. Redemonstration of diffuse anterolateral and anteromedial subcutaneous edema and swelling of the right calf. Findings are again compatible with cellulitis. Again no walled-off abscess or deep fascial plane fluid is seen. 2. No acute bone abnormality. 3. 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    04/23/2022. right lower extremity debridement and removal of nonviable tissue by Dr. Sharol Given.  Significant microbiology data: 6/12>> COVID/influenza PCR: Negative 6/12>> urine culture: Klebsiella (colonization) 6/12>> blood culture: 1/2-Streptococcus group G 6/14>> blood culture: No growth  Hospital Course   Sepsis due to RLE cellulitis and streptococcal bacteremia: She was seen by ID and orthopedics, underwent operative debridement of her cellulitis with removal of nonviable tissue and serosanguineous fluid by Dr. Sharol Given, CT scan, MRI of the right lower extremity stable, echocardiogram does not show any evidence of endocarditis.  She was initially treated with combination of IV penicillin and  Zyvox and now will be discharged on 1 week of oral Augmentin per ID recommendation.  Outpatient follow-up with Dr. Sharol Given for first dressing change.  Will be discharged home with wound VAC.  Patient confirms that she has excellent care at home and does not want placement.   Acute metabolic encephalopathy: Resolved-likely due to sepsis.     AKI: Due to dehydration and sepsis.  Resolved after hydration with IV fluids.   Pancytopenia: Suspect due to sepsis/infection-B12 level stable-ferritin count significantly elevated-reflecting acute phase reactant.  Pancytopenia also worsened by Zyvox use much improved after Zyvox was discontinued.  Counts are much improved PCP to monitor.   HTN: Stable currently on no medications at home.   HLD: Continue statin  Depression/anxiety: Resume Lexapro   DM-2 (A1c 5.2 on 6/30): good outpatient control continue home regimen.   Discharge diagnosis     Principal Problem:   Group G streptococcal infection Active Problems:   Essential hypertension, benign   Type 2 diabetes mellitus with hyperglycemia (HCC)   Sepsis ([place])   Cellulitis of right lower extremity    Discharge instructions    Discharge Instructions     Diet - low sodium heart healthy   Complete by: As directed    Discharge instructions   Complete by: As directed    Follow with Primary MD Jonathon Jordan, MD in 7 days follow-up with Dr. Sharol Given in a week.  Keep your wound VAC plugged in so that it discharged, keep your right lower extremity elevated under 2 pillows when you are in bed, when you are sitting in a recliner keep your right leg elevated.  Get CBC, CMP, 2 view Chest X ray -  checked next visit within 1 week by Primary MD    Activity: As tolerated with Full fall precautions use walker/cane & assistance as needed  Disposition Home    Diet: Heart Healthy Low Carb  Special Instructions: If you have smoked or chewed Tobacco  in the last 2 yrs please stop smoking, stop any regular  Alcohol  and or any Recreational drug use.  On your next visit with your primary care physician please Get Medicines reviewed and adjusted.  Please request your Prim.MD to go over all Hospital Tests and Procedure/Radiological results at the follow up, please get all Hospital records sent to your Prim MD by signing hospital release before you go home.  If you experience worsening of your admission symptoms, develop shortness of breath, life threatening emergency, suicidal or homicidal thoughts you must seek medical attention immediately by calling 911 or calling your MD immediately  if symptoms less severe.  You Must read complete instructions/literature along with all the possible adverse reactions/side effects for all the Medicines you take and that have been prescribed to you. Take any new Medicines after you have completely understood and accpet all the possible adverse reactions/side effects.   Discharge wound care:   Complete by: As directed    Keep your wound VAC plugged in and charged, keep right lower extremity elevated.  First dressing change at Dr. Jess Barters office.   Increase activity slowly   Complete  by: As directed    Negative Pressure Wound Therapy - Incisional   Complete by: As directed    Keep the wound VAC dressing clean and dry.  Plug the wound VAC is in periodically to maintain the charge.       Discharge Medications   Allergies as of 04/27/2022       Reactions   Other    Refuses whole blood but does accept albumin   Atorvastatin Other (See Comments)   Metronidazole Other (See Comments)   Chest pain, A-fib   Septra [sulfamethoxazole-trimethoprim] Other (See Comments)   Blisters        Medication List     TAKE these medications    acetaminophen 325 MG tablet Commonly known as: TYLENOL Take 2 tablets (650 mg total) by mouth every 6 (six) hours as needed. What changed: reasons to take this   amoxicillin 500 MG tablet Commonly known as: AMOXIL Take 2  tablets (1,000 mg total) by mouth 4 (four) times daily -  before meals and at bedtime.   cyanocobalamin 1000 MCG tablet Take 1,000 mcg by mouth daily.   escitalopram 20 MG tablet Commonly known as: LEXAPRO Take 20 mg by mouth daily.   fluticasone 50 MCG/ACT nasal spray Commonly known as: FLONASE Place 2 sprays into both nostrils daily as needed for allergies.   furosemide 20 MG tablet Commonly known as: LASIX Take 20 mg by mouth daily.   gabapentin 300 MG capsule Commonly known as: NEURONTIN Take 300 mg by mouth 2 (two) times daily.   Insulin Pen Needle 32G X 6 MM Misc Inject into the skin daily.   metoprolol succinate 25 MG 24 hr tablet Commonly known as: Toprol XL Take 1 tablet (25 mg total) by mouth daily.   omeprazole 20 MG capsule Commonly known as: PRILOSEC Take 20 mg by mouth 2 (two) times daily before a meal.   oxyCODONE 5 MG immediate release tablet Commonly known as: Oxy IR/ROXICODONE Take 1 tablet (5 mg total) by mouth every 6 (six) hours as needed for severe pain.   Ozempic (1 MG/DOSE) 4 MG/3ML Sopn Generic drug: Semaglutide (1 MG/DOSE) Inject 2 mg into the skin every Friday.   polyethylene glycol 17 g packet Commonly known as: MIRALAX / GLYCOLAX Take 17 g by mouth daily as needed for moderate constipation.   potassium chloride 10 MEQ tablet Commonly known as: KLOR-CON Take 10 mEq by mouth daily.   rosuvastatin 5 MG tablet Commonly known as: CRESTOR Take 5 mg by mouth at bedtime.   Vitamin D 50 MCG (2000 UT) tablet Take 2,000 Units by mouth daily.               Durable Medical Equipment  (From admission, onward)           Start     Ordered   04/26/22 1226  For home use only DME 3 n 1  Once       Comments: Patient will be discharging home with family and the room she is staying does not have a bathroom. Current mobility is limited.   04/26/22 1227   04/26/22 1226  For home use only DME Walker rolling  Once       Question Answer  Comment  Walker: With [place]   Patient needs a walker to treat with the following condition Decreased functional mobility and endurance      04/26/22 1227  Discharge Care Instructions  (From admission, onward)           Start     Ordered   04/27/22 0000  Discharge wound care:       Comments: Keep your wound VAC plugged in and charged, keep right lower extremity elevated.  First dressing change at Dr. Jess Barters office.   04/27/22 1016             Follow-up Information     Newt Minion, MD Follow up in 1 week(s).   Specialty: Orthopedic Surgery Contact information: [place] [address] [phone]         Jonathon Jordan, MD. Schedule an appointment as soon as possible for a visit in 1 week(s).   Specialty: Family Medicine Contact information: [address] [place] [address] [phone]         Lelon Perla, MD .   Specialty: Cardiology Contact information: [address] [place] [address] [phone]                 Major procedures and Radiology Reports - PLEASE review detailed and final reports thoroughly  -      MR TIBIA FIBULA RIGHT WO CONTRAST  Result Date: 04/23/2022 CLINICAL DATA:  Soft tissue infection suspected. Hospital admission 2 weeks ago for right leg cellulitis. Right-greater-than-left leg redness and pain. Sepsis. History of diabetes. EXAM: MRI OF LOWER RIGHT EXTREMITY WITHOUT CONTRAST TECHNIQUE: Multiplanar, multisequence MR imaging of the right tibia and fibula was performed. No intravenous contrast was administered. COMPARISON:  Right tibia and fibula radiographs 04/19/2022; CT right tibia and fibula 04/19/2022 FINDINGS: Bones/Joint/Cartilage Magnetic susceptibility artifact is seen from total right knee arthroplasty hardware. Within the limitations of the metallic artifact and inhomogeneous fat saturation no definite tibia or fibula bone marrow  signal abnormality is seen. No cortical destruction is seen. Ligaments On large field-of-view images, the left tibia and fibula are included. The left knee medial collateral ligament and fibular collateral ligament are grossly intact. Muscles and Tendons There is a fat intensity lipoma within the anterior aspect of the distal right medial head of the gastrocnemius muscle measuring up to 1.7 x 2.5 x 5.4 cm (transverse by AP by craniocaudal), benign (axial series 11, image 33 and coronal series 3, image 18). Mild feathery fatty infiltration of the distal lateral aspect of the medial head of the gastrocnemius muscle. Increased T2 signal is seen within the proximal anterior compartment and deep posterior compartment on axial T2 fat saturation images (series 14 images 15 through 21), however this may be artifactual due to inhomogeneous fat saturation as this is not seen on coronal STIR images. No deep fascial plane fluid is seen. Soft tissues There is diffuse edema and swelling throughout the right lower extremity subcutaneous fat, greatest within the proximal anterior shin subcutaneous fat but also seen throughout the anterolateral and anterior medial shin subcutaneous fat. On large field-of-view coronal images of the left leg demonstrates more mild diffuse subcutaneous fat edema predominantly within the distal 50% of the left calf. No walled-off fluid collection. IMPRESSION: 1. Redemonstration of diffuse anterolateral and anteromedial subcutaneous edema and swelling of the right calf. Findings are again compatible with cellulitis. Again no walled-off abscess or deep fascial plane fluid is seen. 2. No acute bone abnormality. 3. 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 Electronically Signed   By: Yvonne Kendall M.D.   On: 04/23/2022 09:06   ECHOCARDIOGRAM COMPLETE  Result Date: 04/22/2022  ECHOCARDIOGRAM REPORT   Patient Name:   JOSEFITA WEISSMANN Date of Exam: 04/22/2022 Medical Rec #:  [id number]        Height:       64.0 in Accession #:    [id number]      Weight:       235.0 lb Date of Birth:  11-May-1946       BSA:          2.095 m Patient Age:    76 years        BP:           107/62 mmHg Patient Gender: F               HR:           82 bpm. Exam Location:  Inpatient Procedure: 2D Echo, Cardiac Doppler and Color Doppler Indications:    Bacteremia  History:        Patient has prior history of Echocardiogram examinations, most                 recent 08/24/2019. Arrythmias:Atrial Fibrillation.  Sonographer:    Clayton Lefort RDCS (AE) Referring Phys: [place]  Sonographer Comments: Suboptimal apical window. IMPRESSIONS  1. Left ventricular ejection fraction, by estimation, is 60 to 65%. The left ventricle has normal function. The left ventricle has no regional wall motion abnormalities. There is severe concentric left ventricular hypertrophy. Left ventricular diastolic  parameters are indeterminate.  2. Right ventricular systolic function is normal. The right ventricular size is normal.  3. Left atrial size was moderately dilated.  4. The mitral valve is normal in structure. No evidence of mitral valve regurgitation. No evidence of mitral stenosis.  5. The aortic valve is tricuspid. There is moderate calcification of the aortic valve. There is moderate thickening of the aortic valve. Aortic valve regurgitation is not visualized. Aortic valve sclerosis/calcification is present, without any evidence of aortic stenosis.  6. 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  7. The inferior vena cava is normal in size with greater than 50% respiratory variability, suggesting right atrial pressure of 3 mmHg. Conclusion(s)/Recommendation(s): No evidence of valvular vegetations on this transthoracic echocardiogram. Consider a transesophageal echocardiogram to exclude infective endocarditis if clinically indicated. FINDINGS  Left  Ventricle: Left ventricular ejection fraction, by estimation, is 60 to 65%. The left ventricle has normal function. The left ventricle has no regional wall motion abnormalities. The left ventricular internal cavity size was normal in size. There is  severe concentric left ventricular hypertrophy. Left ventricular diastolic parameters are indeterminate. Right Ventricle: The right ventricular size is normal. No increase in right ventricular wall thickness. Right ventricular systolic function is normal. Left Atrium: Left atrial size was moderately dilated. Right Atrium: Right atrial size was normal in size. Pericardium: There is no evidence of pericardial effusion. Presence of epicardial fat layer. Mitral Valve: The mitral valve is normal in structure. Mild mitral annular calcification. No evidence of mitral valve regurgitation. No evidence of mitral valve stenosis. Tricuspid Valve: The tricuspid valve is normal in structure. Tricuspid valve regurgitation is trivial. No evidence of tricuspid stenosis. Aortic Valve: The aortic valve is tricuspid. There is moderate calcification of the aortic valve. There is moderate thickening of the aortic valve. Aortic valve regurgitation is not visualized. Aortic valve sclerosis/calcification is present, without any  evidence of aortic stenosis. Aortic valve mean gradient measures 6.0 mmHg.  Aortic valve peak gradient measures 10.9 mmHg. Aortic valve area, by VTI measures 2.32 cm. Pulmonic Valve: The pulmonic valve was normal in structure. Pulmonic valve regurgitation is not visualized. No evidence of pulmonic stenosis. 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 Venous: The inferior vena cava is normal in size with greater than 50% respiratory variability, suggesting right atrial pressure of 3 mmHg. IAS/Shunts: No atrial level shunt detected by color flow Doppler.  LEFT VENTRICLE PLAX  2D LVIDd:         3.70 cm   Diastology LVIDs:         1.80 cm   LV e' medial:    11.10 cm/s LV PW:         1.50 cm   LV E/e' medial:  11.1 LV IVS:        2.10 cm   LV e' lateral:   10.80 cm/s LVOT diam:     2.20 cm   LV E/e' lateral: 11.4 LV SV:         75 LV SV Index:   36 LVOT Area:     3.80 cm  RIGHT VENTRICLE             IVC RV Basal diam:  3.10 cm     IVC diam: 2.20 cm RV S prime:     19.90 cm/s TAPSE (M-mode): 2.4 cm LEFT ATRIUM           Index        RIGHT ATRIUM           Index LA diam:      3.30 cm 1.58 cm/m   RA Area:     25.00 cm LA Vol (A2C): 31.4 ml 14.99 ml/m  RA Volume:   83.90 ml  40.04 ml/m LA Vol (A4C): 47.2 ml 22.53 ml/m  AORTIC VALVE AV Area (Vmax):    1.98 cm AV Area (Vmean):   2.16 cm AV Area (VTI):     2.32 cm AV Vmax:           165.00 cm/s AV Vmean:          111.000 cm/s AV VTI:            0.323 m AV Peak Grad:      10.9 mmHg AV Mean Grad:      6.0 mmHg LVOT Vmax:         85.80 cm/s LVOT Vmean:        63.000 cm/s LVOT VTI:          0.197 m LVOT/AV VTI ratio: 0.61  AORTA Ao Root diam: 4.20 cm Ao Asc diam:  4.00 cm MITRAL VALVE MV Area (PHT): 5.06 cm     SHUNTS MV Decel Time: 150 msec     Systemic VTI:  0.20 m MV E velocity: 123.00 cm/s  Systemic Diam: 2.20 cm Kardie Tobb DO Electronically signed by Berniece Salines DO Signature Date/Time: 04/22/2022/6:02:16 PM    Final    VAS [place] LOWER EXTREMITY VENOUS (DVT)  Result Date: 04/20/2022  Lower Venous DVT Study Patient Name:  KHALILAH HOKE  Date of Exam:   04/20/2022 Medical Rec #: [id number]        Accession #:    [id number] Date of Birth: 03/05/1946        Patient Gender: F Patient Age:   58 years Exam Location:  [facility] Procedure:  VAS [place] LOWER EXTREMITY VENOUS (DVT) Referring Phys: Oren Binet --------------------------------------------------------------------------------  Indications: Edema, Erythema, and cellulitis.  Limitations: Poor ultrasound/tissue interface and body habitus. Comparison Study: 08/11/2016-  negative bilateral lower extremity venous duplex Performing Technologist: Maudry Mayhew MHA, RDMS, RVT, RDCS  Examination Guidelines: A complete evaluation includes B-mode imaging, spectral Doppler, color Doppler, and power Doppler as needed of all accessible portions of each vessel. Bilateral testing is considered an integral part of a complete examination. Limited examinations for reoccurring indications may be performed as noted. The reflux portion of the exam is performed with the patient in reverse Trendelenburg.  +---------+---------------+---------+-----------+----------+--------------+ RIGHT    CompressibilityPhasicitySpontaneityPropertiesThrombus Aging +---------+---------------+---------+-----------+----------+--------------+ CFV      Full           Yes      Yes                                 +---------+---------------+---------+-----------+----------+--------------+ SFJ      Full                                                        +---------+---------------+---------+-----------+----------+--------------+ FV Prox  Full                                                        +---------+---------------+---------+-----------+----------+--------------+ FV Mid   Full                                                        +---------+---------------+---------+-----------+----------+--------------+ FV DistalFull                                                        +---------+---------------+---------+-----------+----------+--------------+ PFV      Full                                                        +---------+---------------+---------+-----------+----------+--------------+ POP      Full           Yes      Yes                                 +---------+---------------+---------+-----------+----------+--------------+   Right Technical Findings: Not visualized segments include Inadequate visualization posterior tibial and peroneal veins.   +----+---------------+---------+-----------+----------+--------------+ LEFTCompressibilityPhasicitySpontaneityPropertiesThrombus Aging +----+---------------+---------+-----------+----------+--------------+ CFV Full           Yes      Yes                                 +----+---------------+---------+-----------+----------+--------------+  Summary: RIGHT: - There is no evidence of deep vein thrombosis in the lower extremity. However, portions of this examination were limited- see technologist comments above.  - No cystic structure found in the popliteal fossa.  LEFT: - No evidence of common femoral vein obstruction.  *See table(s) above for measurements and observations. Electronically signed by Deitra Mayo MD on 04/20/2022 at 3:13:07 PM.    Final    CT HEAD WO CONTRAST (5MM)  Result Date: 04/20/2022 CLINICAL DATA:  76 year old female altered mental status. EXAM: CT HEAD WITHOUT CONTRAST TECHNIQUE: Contiguous axial images were obtained from the base of the skull through the vertex without intravenous contrast. RADIATION DOSE REDUCTION: This exam was performed according to the departmental dose-optimization program which includes automated exposure control, adjustment of the mA and/or kV according to patient size and/or use of iterative reconstruction technique. COMPARISON:  None Available. FINDINGS: Brain: Cerebral volume is within normal limits for age. No midline shift, ventriculomegaly, mass effect, evidence of mass lesion, intracranial hemorrhage or evidence of cortically based acute infarction. Minimal to mild for age subcortical white matter hypodensity. No cortical encephalomalacia and otherwise normal gray-white differentiation. Vascular: Calcified atherosclerosis at the skull base. No suspicious intracranial vascular hyperdensity. Skull: No acute osseous abnormality identified. Anterior C1-C2 degeneration. Sinuses/Orbits: Right maxillary sinus mucoperiosteal thickening with  superimposed fluid level. But the other paranasal sinuses are well aerated. Bilateral tympanic cavities and mastoids appear clear. Other: Postoperative changes to both globes. No acute orbit or scalp soft tissue finding. Negative visible noncontrast deep soft tissue spaces of the face. IMPRESSION: 1. No acute intracranial abnormality. Negative for age noncontrast CT appearance of the brain. 2. 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 Electronically Signed   By: Genevie Ann M.D.   On: 04/20/2022 06:53   CT TIBIA FIBULA RIGHT W CONTRAST  Result Date: 04/19/2022 CLINICAL DATA:  Soft tissue infection. EXAM: CT OF THE LOWER RIGHT EXTREMITY WITH CONTRAST TECHNIQUE: Multidetector CT imaging of the lower right extremity was performed according to the standard protocol following intravenous contrast administration. RADIATION DOSE REDUCTION: This exam was performed according to the departmental dose-optimization program which includes automated exposure control, adjustment of the mA and/or kV according to patient size and/or use of iterative reconstruction technique. CONTRAST:  18m OMNIPAQUE IOHEXOL 300 MG/ML  SOLN COMPARISON:  X-ray same day FINDINGS: Bones/Joint/Cartilage There is no evidence for fracture or dislocation. Total knee arthroplasty appears in anatomic alignment without evidence for hardware loosening. No obvious joint effusions are seen. Ligaments Suboptimally assessed by CT. Muscles and Tendons Deep fascial planes appear preserved. Muscles and tendons are grossly within normal limits. No enhancing fluid collections. Soft tissues There is diffuse subcutaneous swelling and edema throughout the mid and distal lower extremity. There is no evidence for soft tissue gas or enhancing fluid collection. There are some nonspecific scattered subcutaneous calcifications. IMPRESSION: 1. Diffuse subcutaneous edema and swelling of the mid and distal lower extremity. Findings can be seen in the setting of cellulitis. No  evidence for soft tissue gas, abscess, or deep fascial plane involvement. 2. No acute bony abnormality. 3. Knee arthroplasty appears uncomplicated. Electronically Signed   By: ARonney AstersM.D.   On: 04/19/2022 23:53   DG Tibia/Fibula Right Port  Result Date: 04/19/2022 CLINICAL DATA:  Fever, body aches EXAM: PORTABLE RIGHT TIBIA AND FIBULA - 2 VIEW COMPARISON:  None Available. FINDINGS: Changes of right knee replacement partially visualized. No acute bony abnormality. Specifically, no fracture, subluxation, or dislocation. Soft tissues are intact. IMPRESSION: No acute  bony abnormality. Electronically Signed   By: Rolm Baptise M.D.   On: 04/19/2022 21:09   DG Chest Port 1 View  Result Date: 04/19/2022 CLINICAL DATA:  Questionable sepsis - evaluate for abnormality EXAM: PORTABLE CHEST 1 VIEW COMPARISON:  11/17/2021 FINDINGS: Cardiomegaly. No confluent airspace opacities, effusions or edema. Mediastinal contours within normal limits. No acute bony abnormality. IMPRESSION: Cardiomegaly.  No active disease. Electronically Signed   By: Rolm Baptise M.D.   On: 04/19/2022 21:08     Today   Subjective    Anjuli Gemmill today has no headache,no chest abdominal pain,no new weakness tingling or numbness, feels much better wants to go home today.     Objective   Blood pressure (!) 111/54, pulse 78, temperature 98.5 F (36.9 C), temperature source Oral, resp. rate 20, height '5\' 4"'$  (1.626 m), weight 106.6 kg, SpO2 92 %.   Intake/Output Summary (Last 24 hours) at 04/27/2022 1023 Last data filed at 04/27/2022 0843 Gross per 24 hour  Intake --  Output 2750 ml  Net -2750 ml    Exam  Awake Alert, No new F.N deficits,    [place].AT,PERRAL Supple Neck,   Symmetrical Chest wall movement, Good air movement bilaterally, CTAB RRR,No Gallops,   +ve B.Sounds, Abd Soft, Non tender,  No Cyanosis, right lower extremity under bandage hooked to wound VAC   Data Review   Recent Labs  Lab 04/22/22 0041  04/23/22 0108 04/24/22 0127 04/25/22 0243 04/26/22 0232  WBC 2.4* 2.5* 2.6* 2.8* 3.3*  HGB 9.6* 9.1* 8.9* 8.9* 8.6*  HCT 28.7* 27.3* 27.2* 27.0* 26.9*  PLT 53* 58* 71* 84* 94*  MCV 99.3 98.6 101.5* 99.6 100.7*  MCH 33.2 32.9 33.2 32.8 32.2  MCHC 33.4 33.3 32.7 33.0 32.0  RDW 14.7 14.5 14.6 14.6 14.6  LYMPHSABS 0.5* 0.6* 0.7 1.0 1.0  MONOABS 0.2 0.2 0.3 0.2 0.3  EOSABS 0.0 0.1 0.1 0.1 0.1  BASOSABS 0.0 0.0 0.0 0.0 0.0    Recent Labs  Lab 04/22/22 0041 04/23/22 0108 04/24/22 0127 04/25/22 0243 04/26/22 0232  NA 136 135 133* 136 135  K 3.9 3.9 3.9 4.0 4.0  CL 105 103 103 103 102  CO2 '23 23 24 23 25  '$ GLUCOSE 128* 116* 139* 116* 127*  BUN '19 15 12 9 9  '$ CREATININE 0.95 0.83 0.72 0.73 0.70  CALCIUM 7.9* 8.2* 7.5* 7.7* 7.6*  AST '27 21 17 16 '$ 14*  ALT '21 16 16 15 13  '$ ALKPHOS 54 64 76 87 95  BILITOT 1.6* 1.8* 1.1 1.2 1.1  ALBUMIN 2.5* 2.3* 2.1* 2.0* 1.8*  MG 1.6* 2.2 2.0 1.8 1.9  CRP  --   --  16.0* 12.3* 14.6*  PROCALCITON  --  11.24 5.61 3.27 1.80  INR  --  1.1  --   --   --     Total Time in preparing paper work, data evaluation and todays exam - 35 minutes  Lala Lund M.D on 04/27/2022 at 10:23 AM  [facility]

## 2022-04-27 NOTE — TOC Transition Note (Addendum)
Transition of Care [facility]) - CM/SW Discharge Note   Patient Details  Name: Kathleen Wiley MRN: [id number] Date of Birth: [birth year]  [facility] CM/SW Contact:  Carles Collet, RN Phone Number: 04/27/2022, 10:22 AM   Clinical Narrative:    Spoke w patient at bedside. Informed her that [facility] services could not be set up. She understands that proveena VAC should be discontinued at Dr Jess Barters office within the next 10 days. She confirms that her daughter in law will learn dressing changes as needed after VAc removed, and will also be providing transportation home.  DME RW and 3/1 for home is room. No other TOC needs identified   Daughter in law arrived to pick up patient and now states that the patient can't come home with her because her bed is too high, and she will have her grandchildren with her.  Explained to them that the patient is medically ready for discharge, and does not have indication to go to a SNF.  Spoke w CSW who will submit for auth for SNF to [facility], although it is unlikely patient will receive auth. Explained to patient and daughter in law that insurance is unlikely to cover SNF, but we can try. Explained if insurance approves, there may not be a lot of options for facilities.  MD aware  Continued conversation with daughter in law and patient and they decided that they did not a facility and could manage at home. Patient will utilize couch so she can maneuver better and keep foot elevated when not walking. Reviewed Dr Jess Barters note that she is WBAT. They have RW 3/1 to go home with and a WC at home.  MD and CSW updated    Final next level of care: Home/Self Care Barriers to Discharge: No Barriers Identified   Patient Goals and CMS Choice Patient states their goals for this hospitalization and ongoing recovery are:: to go to son's house in [facility] [facility].gov Compare Post Acute Care list provided to:: Patient Choice offered to / list presented to :  Patient  Discharge Placement                       Discharge Plan and Services   Discharge Planning Services: CM Consult            DME Arranged: 3-N-1, Walker rolling DME Agency: [employer] Date DME Agency Contacted: 04/26/22 Time DME Agency Contacted: 1229 Representative spoke with at DME Agency: Jodell Cipro HH Arranged: PT, OT, Nurse's Aide          Social Determinants of Health (SDOH) Interventions     Readmission Risk Interventions     No data to display

## 2022-04-27 NOTE — Discharge Instructions (Signed)
Follow with Primary MD Jonathon Jordan, MD in 7 days follow-up with Dr. Sharol Given in a week.  Keep your wound VAC plugged in so that it discharged, keep your right lower extremity elevated under 2 pillows when you are in bed, when you are sitting in a recliner keep your right leg elevated.  Get CBC, CMP, 2 view Chest X ray -  checked next visit within 1 week by Primary MD    Activity: As tolerated with Full fall precautions use walker/cane & assistance as needed  Disposition Home    Diet: Heart Healthy Low Carb  Special Instructions: If you have smoked or chewed Tobacco  in the last 2 yrs please stop smoking, stop any regular Alcohol  and or any Recreational drug use.  On your next visit with your primary care physician please Get Medicines reviewed and adjusted.  Please request your Prim.MD to go over all Hospital Tests and Procedure/Radiological results at the follow up, please get all Hospital records sent to your Prim MD by signing hospital release before you go home.  If you experience worsening of your admission symptoms, develop shortness of breath, life threatening emergency, suicidal or homicidal thoughts you must seek medical attention immediately by calling 911 or calling your MD immediately  if symptoms less severe.  You Must read complete instructions/literature along with all the possible adverse reactions/side effects for all the Medicines you take and that have been prescribed to you. Take any new Medicines after you have completely understood and accpet all the possible adverse reactions/side effects.

## 2022-04-27 NOTE — NC FL2 (Signed)
[place] LEVEL OF CARE SCREENING TOOL     IDENTIFICATION  Patient Name: Kathleen Wiley Birthdate: [birth year] Sex: female Admission Date (Current Location): 04/19/2022  [facility] [place] Number:  Herbalist and Address:  [facility] [place] [place] [address] [place], [place] [address]      Provider Number: [id number]  Attending Physician Name and Address:  Thurnell Lose, MD  Relative Name and Phone Number:       Current Level of Care: Hospital Recommended Level of Care: [place] Prior Approval Number:    Date Approved/Denied:   PASRR Number: [id number]  Discharge Plan: SNF    Current Diagnoses: Patient Active Problem List   Diagnosis Date Noted   Group G streptococcal infection 04/21/2022   Sepsis ([place]) 04/20/2022   Cellulitis of right lower extremity 04/20/2022   Gallstone pancreatitis 03/15/2021   Acute cholecystitis 03/15/2021   Controlled type 2 diabetes mellitus with neuropathy ([place]) 03/15/2021   Obesity with body mass index (BMI) of 30.0 to 39.9 03/15/2021   Acute gallstone pancreatitis 03/15/2021   Abnormal cervical Papanicolaou smear 02/18/2021   Abnormal liver function tests 02/18/2021   Acquired hallux valgus 02/18/2021   Acute hypoxemic respiratory failure ([place]) 02/18/2021   Aneurysm of thoracic aorta ([place]) 02/18/2021   Anxiety 02/18/2021   Aortic root dilatation ([place]) 02/18/2021   Aortic valve disorder 02/18/2021   Arthropathy 02/18/2021   Callosity 02/18/2021   Cardiomegaly 02/18/2021   Chest discomfort 02/18/2021   Chronic kidney disease (CKD) stage G2/A1, mildly decreased glomerular filtration rate (GFR) between 60-89 mL/min/1.73 square meter and albuminuria creatinine ratio less than 30 mg/g 02/18/2021   Chronic kidney disease, stage 2 (mild) 02/18/2021   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   COVID-19 virus infection 02/18/2021   Diabetic renal disease ([place]) 02/18/2021   Edema  02/18/2021   Edema of both lower legs 02/18/2021   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   Leukopenia 02/18/2021   Low back pain 02/18/2021   Malabsorption syndrome 73/41/9379   Metabolic syndrome 02/40/9735   Mild cervical dysplasia 02/18/2021   Mixed anxiety and depressive disorder 02/18/2021   Neuropathy 02/18/2021   Obstructive sleep apnea syndrome 02/18/2021   Senile purpura ([place]) 02/18/2021   Snoring 02/18/2021   Elevated transaminase level 02/18/2021   Type 2 diabetes mellitus with hyperglycemia ([place]) 02/18/2021   Urge incontinence of urine 02/18/2021   Vitamin B12 deficiency (non anemic) 02/18/2021   Vitamin D deficiency 02/18/2021   Pneumonia due to COVID-19 virus 10/25/2019   Hypokalemia 10/25/2019   Hyperbilirubinemia 10/25/2019   Pre-diabetes    History of total hip arthroplasty, right 09/18/2018   Osteoarthritis of right hip 09/14/2018   Morbid obesity, BMI not known ([place]) 09/17/2016   Stasis ulcer ([place]) 09/17/2016   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 ([place]) 08/29/2016   Cellulitis of left lower leg 08/10/2016   Infection of flexor tendon sheath 06/14/2016   Pain of right thumb 06/14/2016   GERD (gastroesophageal reflux disease) 02/09/2013   Essential hypertension, benign 02/09/2013   Hypercholesterolemia 02/09/2013   Unspecified constipation 02/09/2013   Muscle spasm 02/09/2013   Osteoarthritis of right knee 01/26/2013   Osteoarthritis of left hip 05/10/2012    Orientation RESPIRATION BLADDER Height & Weight     Self, Time, Situation, Place  Normal Continent, External catheter Weight: 235 lb 0.2 oz (106.6 kg) Height:  '5\' 4"'$  (162.6 cm)  BEHAVIORAL SYMPTOMS/MOOD NEUROLOGICAL BOWEL NUTRITION STATUS  Continent Diet (See dc summary)  AMBULATORY STATUS COMMUNICATION OF NEEDS Skin   Limited Assist Verbally Surgical wounds (Closed incision on leg; Has prevena wound vac for 1 week)                        Personal Care Assistance Level of Assistance  Bathing, Feeding, Dressing Bathing Assistance: Maximum assistance Feeding assistance: Independent Dressing Assistance: Limited assistance     Functional Limitations Info             [place]  PT (By licensed PT), OT (By licensed OT)     PT Frequency: 5x/week OT Frequency: 5x/week            Contractures Contractures Info: Not present    Additional Factors Info  Code Status, Allergies Code Status Info: Full Allergies Info: Other, Atorvastatin, Metronidazole, Septra (Sulfamethoxazole-trimethoprim)           Current Medications (04/27/2022):  This is the current hospital active medication list Current Facility-Administered Medications  Medication Dose Route Frequency Provider Last Rate Last Admin   acetaminophen (TYLENOL) tablet 1,000 mg  1,000 mg Oral Q8H Newt Minion, MD   1,000 mg at 04/27/22 1254   docusate sodium (COLACE) capsule 200 mg  200 mg Oral BID Thurnell Lose, MD   200 mg at 04/27/22 0825   escitalopram (LEXAPRO) tablet 20 mg  20 mg Oral Daily Newt Minion, MD   20 mg at 04/27/22 0825   ferrous sulfate tablet 325 mg  325 mg Oral BID WC Thurnell Lose, MD   325 mg at 78/67/67 2094   folic acid (FOLVITE) tablet 1 mg  1 mg Oral Daily Thurnell Lose, MD   1 mg at 04/27/22 7096   gabapentin (NEURONTIN) capsule 300 mg  300 mg Oral TID Newt Minion, MD   300 mg at 04/27/22 0824   insulin aspart (novoLOG) injection 0-9 Units  0-9 Units Subcutaneous TID WC Newt Minion, MD   2 Units at 04/27/22 0825   ondansetron (ZOFRAN) injection 4 mg  4 mg Intravenous Q6H PRN Newt Minion, MD   4 mg at 04/20/22 1225   oxyCODONE (Oxy IR/ROXICODONE) immediate release tablet 5 mg  5 mg Oral Q4H PRN Newt Minion, MD   5 mg at 04/27/22 1442   penicillin G potassium 12 Million Units in dextrose 5 % 500 mL continuous infusion  12 Million Units Intravenous Q12H Newt Minion, MD 41.7 mL/hr at  04/27/22 0642 12 Million Units at 04/27/22 0642   polyethylene glycol (MIRALAX / GLYCOLAX) packet 17 g  17 g Oral BID Thurnell Lose, MD   17 g at 04/27/22 0826   rosuvastatin (CRESTOR) tablet 5 mg  5 mg Oral Daily Newt Minion, MD   5 mg at 04/27/22 0825   vitamin B-12 (CYANOCOBALAMIN) tablet 1,000 mcg  1,000 mcg Oral Daily Newt Minion, MD   1,000 mcg at 04/27/22 0825     Discharge Medications: Please see discharge summary for a list of discharge medications.  Relevant Imaging Results:  Relevant Lab Results:   Additional Information SSN: [id number]. [place] COVID-19 Vaccine 08/22/2020 , 02/14/2020 , 01/17/2020  Benard Halsted, LCSW

## 2022-04-27 NOTE — Progress Notes (Signed)
Occupational Therapy Treatment Patient Details Name: Kathleen Wiley MRN: [id number] DOB: 20-Jun-1946 Today's Date: 04/27/2022   History of present illness Pt adm 6/12 with sepsis due to RLE cellulitis. On 6/16 pt underwent Rt leg debridement with 4 compartment fasciotomy and closure and application of Prevena wound VAC.   PMHx: DM, HTN, HLD, cellulitis   OT comments  Patient received in supine and agreeable to OT session. Patient able to get to EOB with verbal cues and min assist. Patient required mod assist to power up and min assist to transfer to recliner. Patient performed grooming and UB bathing and dressing seated in recliner with RLE elevated due to pain. Patient is motivated towards therapy and progressing but is limited by pain. Acute OT to continue to follow.    Recommendations for follow up therapy are one component of a multi-disciplinary discharge planning process, led by the attending physician.  Recommendations may be updated based on patient status, additional functional criteria and insurance authorization.    Follow Up Recommendations  Home health OT    Assistance Recommended at Discharge Intermittent Supervision/Assistance  Patient can return home with the following  A lot of help with bathing/dressing/bathroom;A lot of help with walking and/or transfers;Assistance with cooking/housework;Assist for transportation;Help with stairs or ramp for entrance   Equipment Recommendations  BSC/3in1 (RW)    Recommendations for Other Services      Precautions / Restrictions Precautions Precautions: Fall;Other (comment) Precaution Comments: wound vac Restrictions Weight Bearing Restrictions: No       Mobility Bed Mobility Overal bed mobility: Needs Assistance Bed Mobility: Supine to Sit     Supine to sit: Min assist, HOB elevated     General bed mobility comments: min assist to bring scoot towards EOB    Transfers Overall transfer level: Needs assistance Equipment  used: Rolling walker (2 wheels) Transfers: Sit to/from Stand Sit to Stand: Mod assist     Step pivot transfers: Min assist     General transfer comment: mod assist to power up and min assist to transfer     Balance Overall balance assessment: Needs assistance Sitting-balance support: No upper extremity supported, Feet supported Sitting balance-Leahy Scale: Fair     Standing balance support: During functional activity, Single extremity supported, Bilateral upper extremity supported Standing balance-Leahy Scale: Poor Standing balance comment: required UE support                           ADL either performed or assessed with clinical judgement   ADL Overall ADL's : Needs assistance/impaired     Grooming: Wash/dry hands;Wash/dry face;Oral care;Sitting;Supervision/safety Grooming Details (indicate cue type and reason): in recliner Upper Body Bathing: Set up;Sitting   Lower Body Bathing: Minimal assistance;Sit to/from stand Lower Body Bathing Details (indicate cue type and reason): for peri area cleaning while standing Upper Body Dressing : Set up;Sitting Upper Body Dressing Details (indicate cue type and reason): changed gown                   General ADL Comments: limited by pain with RLE    Extremity/Trunk Assessment              Vision       Perception     Praxis      Cognition Arousal/Alertness: Awake/alert Behavior During Therapy: WFL for tasks assessed/performed Overall Cognitive Status: Impaired/Different from baseline Area of Impairment: Problem solving, Memory  Memory: Decreased short-term memory       Problem Solving: Slow processing          Exercises      Shoulder Instructions       General Comments      Pertinent Vitals/ Pain       Pain Assessment Pain Assessment: Faces Faces Pain Scale: Hurts even more Pain Location: R LE Pain Descriptors / Indicators: Throbbing, Discomfort, Sore,  Grimacing Pain Intervention(s): Limited activity within patient's tolerance, Monitored during session, Repositioned  Home Living                                          Prior Functioning/Environment              Frequency  Min 2X/week        Progress Toward Goals  OT Goals(current goals can now be found in the care plan section)  Progress towards OT goals: Progressing toward goals  Acute Rehab OT Goals Patient Stated Goal: go home with son OT Goal Formulation: With patient Time For Goal Achievement: 05/06/22 Potential to Achieve Goals: Good ADL Goals Pt Will Perform Lower Body Bathing: with modified independence;sit to/from stand Pt Will Transfer to Toilet: with modified independence;ambulating Pt/caregiver will Perform Home Exercise Program: Increased strength;Both right and left upper extremity;With theraband;Independently;With written HEP provided Additional ADL Goal #1: Pt to increase standing tolerance > 7 min during ADLs/mobility with stable vitals and no increased pain.  Plan Discharge plan remains appropriate    Co-evaluation                 AM-PAC OT "6 Clicks" Daily Activity     Outcome Measure   Help from another person eating meals?: None Help from another person taking care of personal grooming?: A Little Help from another person toileting, which includes using toliet, bedpan, or urinal?: A Little Help from another person bathing (including washing, rinsing, drying)?: A Lot Help from another person to put on and taking off regular upper body clothing?: A Little Help from another person to put on and taking off regular lower body clothing?: A Lot 6 Click Score: 17    End of Session Equipment Utilized During Treatment: Rolling walker (2 wheels)  OT Visit Diagnosis: Unsteadiness on feet (R26.81);Other abnormalities of gait and mobility (R26.89);Pain Pain - Right/Left: Right Pain - part of body: Leg   Activity Tolerance  Patient tolerated treatment well   Patient Left in chair;with call bell/phone within reach   Nurse Communication Mobility status        Time: 1749-4496 OT Time Calculation (min): 28 min  Charges: OT General Charges $OT Visit: 1 Visit OT Treatments $Self Care/Home Management : 23-37 mins  Lodema Hong, [place]  Office [phone]   Trixie Dredge 04/27/2022, 10:31 AM

## 2022-04-27 NOTE — Progress Notes (Signed)
Patient ID: Kathleen Wiley, female   DOB: Mar 08, 1946, 76 y.o.   MRN: [id number] Patient is seen in follow-up status post debridement fasciotomies all 4 compartments right leg.  The wound VAC is functioning well.  Cultures are negative.  Plan for discharge today I will follow-up in the office later this week to remove the wound VAC dressing.  Patient was instructed in plugging the unit and to maintain at discharge keep the dressing clean and dry elevate her leg and weightbearing as tolerated.

## 2022-04-27 NOTE — Plan of Care (Signed)
  Problem: Metabolic: Goal: Ability to maintain appropriate glucose levels will improve Outcome: Progressing   Problem: Tissue Perfusion: Goal: Adequacy of tissue perfusion will improve Outcome: Progressing   Problem: Clinical Measurements: Goal: Will remain free from infection Outcome: Progressing   Problem: Clinical Measurements: Goal: Diagnostic test results will improve Outcome: Progressing

## 2022-04-28 ENCOUNTER — Telehealth: Payer: Self-pay

## 2022-04-28 LAB — AEROBIC/ANAEROBIC CULTURE W GRAM STAIN (SURGICAL/DEEP WOUND)
Culture: NO GROWTH
Culture: NO GROWTH
Gram Stain: NONE SEEN
Gram Stain: NONE SEEN

## 2022-04-28 NOTE — Telephone Encounter (Signed)
Patient calling concerning pain medication.  Stated that she is in a lot of pain.  Would like CB at [phone].  Please advise.  Thank you.

## 2022-04-29 ENCOUNTER — Encounter: Payer: Medicare Other | Admitting: Orthopedic Surgery

## 2022-04-29 NOTE — Telephone Encounter (Signed)
Pt just now rescheduled appt for next Tuesday 05/04/22

## 2022-04-29 NOTE — Telephone Encounter (Signed)
Pt has an appt this morning and can address issue at that time.

## 2022-04-29 NOTE — Telephone Encounter (Signed)
Pt and dtr informed to use aleve 2 tabs BID along with her oxycodone regimen. She says that she takes every 6 hours, but w/I 3 hours of taking it she is in pain again. I let them know if continues to be in a lot of pain to come in sooner than Tuesday.

## 2022-05-03 ENCOUNTER — Emergency Department (HOSPITAL_COMMUNITY): Payer: Medicare Other

## 2022-05-03 ENCOUNTER — Encounter (HOSPITAL_COMMUNITY): Payer: Self-pay

## 2022-05-03 ENCOUNTER — Telehealth: Payer: Self-pay | Admitting: Orthopedic Surgery

## 2022-05-03 ENCOUNTER — Encounter: Payer: Self-pay | Admitting: Orthopedic Surgery

## 2022-05-03 ENCOUNTER — Inpatient Hospital Stay (HOSPITAL_COMMUNITY)
Admission: EM | Admit: 2022-05-03 | Discharge: 2022-05-06 | DRG: 603 | Disposition: A | Discharge status: Skilled Nursing Facility | Payer: Medicare Other | Source: Ambulatory Visit | Attending: Internal Medicine | Admitting: Internal Medicine

## 2022-05-03 ENCOUNTER — Other Ambulatory Visit: Payer: Self-pay

## 2022-05-03 ENCOUNTER — Ambulatory Visit (INDEPENDENT_AMBULATORY_CARE_PROVIDER_SITE_OTHER): Payer: Medicare Other | Admitting: Orthopedic Surgery

## 2022-05-03 DIAGNOSIS — R9431 Abnormal electrocardiogram [ECG] [EKG]: Secondary | ICD-10-CM | POA: Diagnosis not present

## 2022-05-03 DIAGNOSIS — G4733 Obstructive sleep apnea (adult) (pediatric): Secondary | ICD-10-CM | POA: Diagnosis not present

## 2022-05-03 DIAGNOSIS — B954 Other streptococcus as the cause of diseases classified elsewhere: Secondary | ICD-10-CM | POA: Diagnosis present

## 2022-05-03 DIAGNOSIS — R7881 Bacteremia: Secondary | ICD-10-CM | POA: Diagnosis not present

## 2022-05-03 DIAGNOSIS — L03115 Cellulitis of right lower limb: Secondary | ICD-10-CM | POA: Diagnosis not present

## 2022-05-03 DIAGNOSIS — Z96651 Presence of right artificial knee joint: Secondary | ICD-10-CM | POA: Diagnosis present

## 2022-05-03 DIAGNOSIS — L7622 Postprocedural hemorrhage and hematoma of skin and subcutaneous tissue following other procedure: Secondary | ICD-10-CM | POA: Diagnosis present

## 2022-05-03 DIAGNOSIS — E559 Vitamin D deficiency, unspecified: Secondary | ICD-10-CM | POA: Diagnosis not present

## 2022-05-03 DIAGNOSIS — E669 Obesity, unspecified: Secondary | ICD-10-CM | POA: Diagnosis present

## 2022-05-03 DIAGNOSIS — I10 Essential (primary) hypertension: Secondary | ICD-10-CM | POA: Diagnosis present

## 2022-05-03 DIAGNOSIS — M79604 Pain in right leg: Secondary | ICD-10-CM | POA: Diagnosis not present

## 2022-05-03 DIAGNOSIS — E785 Hyperlipidemia, unspecified: Secondary | ICD-10-CM | POA: Diagnosis present

## 2022-05-03 DIAGNOSIS — E1122 Type 2 diabetes mellitus with diabetic chronic kidney disease: Secondary | ICD-10-CM | POA: Diagnosis not present

## 2022-05-03 DIAGNOSIS — L97915 Non-pressure chronic ulcer of unspecified part of right lower leg with muscle involvement without evidence of necrosis: Secondary | ICD-10-CM

## 2022-05-03 DIAGNOSIS — Z888 Allergy status to other drugs, medicaments and biological substances status: Secondary | ICD-10-CM

## 2022-05-03 DIAGNOSIS — D539 Nutritional anemia, unspecified: Secondary | ICD-10-CM | POA: Diagnosis not present

## 2022-05-03 DIAGNOSIS — Z91199 Patient's noncompliance with other medical treatment and regimen due to unspecified reason: Secondary | ICD-10-CM

## 2022-05-03 DIAGNOSIS — E1140 Type 2 diabetes mellitus with diabetic neuropathy, unspecified: Secondary | ICD-10-CM | POA: Diagnosis not present

## 2022-05-03 DIAGNOSIS — Z6837 Body mass index (BMI) 37.0-37.9, adult: Secondary | ICD-10-CM | POA: Diagnosis not present

## 2022-05-03 DIAGNOSIS — E7800 Pure hypercholesterolemia, unspecified: Secondary | ICD-10-CM | POA: Diagnosis present

## 2022-05-03 DIAGNOSIS — I130 Hypertensive heart and chronic kidney disease with heart failure and stage 1 through stage 4 chronic kidney disease, or unspecified chronic kidney disease: Secondary | ICD-10-CM | POA: Diagnosis present

## 2022-05-03 DIAGNOSIS — L0390 Cellulitis, unspecified: Secondary | ICD-10-CM | POA: Diagnosis not present

## 2022-05-03 DIAGNOSIS — T8131XA Disruption of external operation (surgical) wound, not elsewhere classified, initial encounter: Principal | ICD-10-CM | POA: Diagnosis present

## 2022-05-03 DIAGNOSIS — Z809 Family history of malignant neoplasm, unspecified: Secondary | ICD-10-CM

## 2022-05-03 DIAGNOSIS — R6889 Other general symptoms and signs: Secondary | ICD-10-CM | POA: Diagnosis not present

## 2022-05-03 DIAGNOSIS — Z9049 Acquired absence of other specified parts of digestive tract: Secondary | ICD-10-CM | POA: Diagnosis not present

## 2022-05-03 DIAGNOSIS — R600 Localized edema: Secondary | ICD-10-CM | POA: Diagnosis not present

## 2022-05-03 DIAGNOSIS — N189 Chronic kidney disease, unspecified: Secondary | ICD-10-CM | POA: Diagnosis not present

## 2022-05-03 DIAGNOSIS — I4581 Long QT syndrome: Secondary | ICD-10-CM | POA: Diagnosis not present

## 2022-05-03 DIAGNOSIS — R001 Bradycardia, unspecified: Secondary | ICD-10-CM | POA: Diagnosis present

## 2022-05-03 DIAGNOSIS — R946 Abnormal results of thyroid function studies: Secondary | ICD-10-CM | POA: Diagnosis not present

## 2022-05-03 DIAGNOSIS — Z96641 Presence of right artificial hip joint: Secondary | ICD-10-CM | POA: Diagnosis present

## 2022-05-03 DIAGNOSIS — K219 Gastro-esophageal reflux disease without esophagitis: Secondary | ICD-10-CM | POA: Diagnosis not present

## 2022-05-03 DIAGNOSIS — R7989 Other specified abnormal findings of blood chemistry: Secondary | ICD-10-CM | POA: Diagnosis not present

## 2022-05-03 DIAGNOSIS — Z882 Allergy status to sulfonamides status: Secondary | ICD-10-CM

## 2022-05-03 DIAGNOSIS — Z7985 Long-term (current) use of injectable non-insulin antidiabetic drugs: Secondary | ICD-10-CM

## 2022-05-03 DIAGNOSIS — N182 Chronic kidney disease, stage 2 (mild): Secondary | ICD-10-CM | POA: Diagnosis not present

## 2022-05-03 DIAGNOSIS — L03119 Cellulitis of unspecified part of limb: Secondary | ICD-10-CM | POA: Diagnosis not present

## 2022-05-03 DIAGNOSIS — F419 Anxiety disorder, unspecified: Secondary | ICD-10-CM | POA: Diagnosis present

## 2022-05-03 DIAGNOSIS — Z87891 Personal history of nicotine dependence: Secondary | ICD-10-CM

## 2022-05-03 DIAGNOSIS — Z7401 Bed confinement status: Secondary | ICD-10-CM | POA: Diagnosis not present

## 2022-05-03 DIAGNOSIS — Z66 Do not resuscitate: Secondary | ICD-10-CM | POA: Diagnosis not present

## 2022-05-03 DIAGNOSIS — E1165 Type 2 diabetes mellitus with hyperglycemia: Secondary | ICD-10-CM | POA: Diagnosis not present

## 2022-05-03 DIAGNOSIS — A491 Streptococcal infection, unspecified site: Secondary | ICD-10-CM | POA: Diagnosis not present

## 2022-05-03 DIAGNOSIS — E1152 Type 2 diabetes mellitus with diabetic peripheral angiopathy with gangrene: Secondary | ICD-10-CM | POA: Diagnosis present

## 2022-05-03 DIAGNOSIS — Y838 Other surgical procedures as the cause of abnormal reaction of the patient, or of later complication, without mention of misadventure at the time of the procedure: Secondary | ICD-10-CM | POA: Diagnosis present

## 2022-05-03 DIAGNOSIS — I5032 Chronic diastolic (congestive) heart failure: Secondary | ICD-10-CM | POA: Diagnosis not present

## 2022-05-03 DIAGNOSIS — Z96643 Presence of artificial hip joint, bilateral: Secondary | ICD-10-CM | POA: Diagnosis present

## 2022-05-03 DIAGNOSIS — D649 Anemia, unspecified: Secondary | ICD-10-CM | POA: Diagnosis not present

## 2022-05-03 DIAGNOSIS — Z794 Long term (current) use of insulin: Secondary | ICD-10-CM

## 2022-05-03 DIAGNOSIS — D72819 Decreased white blood cell count, unspecified: Secondary | ICD-10-CM | POA: Diagnosis present

## 2022-05-03 DIAGNOSIS — Z79899 Other long term (current) drug therapy: Secondary | ICD-10-CM

## 2022-05-03 DIAGNOSIS — B955 Unspecified streptococcus as the cause of diseases classified elsewhere: Secondary | ICD-10-CM | POA: Diagnosis not present

## 2022-05-03 DIAGNOSIS — I5042 Chronic combined systolic (congestive) and diastolic (congestive) heart failure: Secondary | ICD-10-CM | POA: Diagnosis present

## 2022-05-03 DIAGNOSIS — M79661 Pain in right lower leg: Secondary | ICD-10-CM | POA: Diagnosis not present

## 2022-05-03 DIAGNOSIS — E46 Unspecified protein-calorie malnutrition: Secondary | ICD-10-CM | POA: Diagnosis not present

## 2022-05-03 LAB — CBC WITH DIFFERENTIAL/PLATELET
Abs Immature Granulocytes: 0.05 10*3/uL (ref 0.00–0.07)
Basophils Absolute: 0.1 10*3/uL (ref 0.0–0.1)
Basophils Relative: 2 %
Eosinophils Absolute: 0.1 10*3/uL (ref 0.0–0.5)
Eosinophils Relative: 2 %
HCT: 28.9 % — ABNORMAL LOW (ref 36.0–46.0)
Hemoglobin: 9 g/dL — ABNORMAL LOW (ref 12.0–15.0)
Immature Granulocytes: 2 %
Lymphocytes Relative: 41 %
Lymphs Abs: 1.3 10*3/uL (ref 0.7–4.0)
MCH: 32.3 pg (ref 26.0–34.0)
MCHC: 31.1 g/dL (ref 30.0–36.0)
MCV: 103.6 fL — ABNORMAL HIGH (ref 80.0–100.0)
Monocytes Absolute: 0.2 10*3/uL (ref 0.1–1.0)
Monocytes Relative: 6 %
Neutro Abs: 1.5 10*3/uL — ABNORMAL LOW (ref 1.7–7.7)
Neutrophils Relative %: 47 %
Platelets: 238 10*3/uL (ref 150–400)
RBC: 2.79 MIL/uL — ABNORMAL LOW (ref 3.87–5.11)
RDW: 14.9 % (ref 11.5–15.5)
WBC: 3.1 10*3/uL — ABNORMAL LOW (ref 4.0–10.5)
nRBC: 0 % (ref 0.0–0.2)

## 2022-05-03 LAB — COMPREHENSIVE METABOLIC PANEL
ALT: 13 U/L (ref 0–44)
AST: 21 U/L (ref 15–41)
Albumin: 2.4 g/dL — ABNORMAL LOW (ref 3.5–5.0)
Alkaline Phosphatase: 87 U/L (ref 38–126)
Anion gap: 10 (ref 5–15)
BUN: 12 mg/dL (ref 8–23)
CO2: 23 mmol/L (ref 22–32)
Calcium: 8.2 mg/dL — ABNORMAL LOW (ref 8.9–10.3)
Chloride: 102 mmol/L (ref 98–111)
Creatinine, Ser: 0.96 mg/dL (ref 0.44–1.00)
GFR, Estimated: >60 mL/min (ref >60)
Glucose, Bld: 146 mg/dL — ABNORMAL HIGH (ref 70–99)
Potassium: 3.9 mmol/L (ref 3.5–5.1)
Sodium: 135 mmol/L (ref 135–145)
Total Bilirubin: 1 mg/dL (ref 0.3–1.2)
Total Protein: 6.5 g/dL (ref 6.5–8.1)

## 2022-05-03 LAB — PHOSPHORUS: Phosphorus: 4.4 mg/dL (ref 2.5–4.6)

## 2022-05-03 LAB — RETICULOCYTES
Immature Retic Fract: 27.7 % — ABNORMAL HIGH (ref 2.3–15.9)
RBC.: 2.56 MIL/uL — ABNORMAL LOW (ref 3.87–5.11)
Retic Count, Absolute: 134.9 10*3/uL (ref 19.0–186.0)
Retic Ct Pct: 5.3 % — ABNORMAL HIGH (ref 0.4–3.1)

## 2022-05-03 LAB — CBG MONITORING, ED: Glucose-Capillary: 98 mg/dL (ref 70–99)

## 2022-05-03 LAB — CK: Total CK: 46 U/L (ref 38–234)

## 2022-05-03 LAB — MAGNESIUM: Magnesium: 2.2 mg/dL (ref 1.7–2.4)

## 2022-05-03 MED ORDER — LINEZOLID 600 MG/300ML IV SOLN
600.0000 mg | Freq: Two times a day (BID) | INTRAVENOUS | Status: DC
  Filled 2022-05-03: qty 300

## 2022-05-03 MED ORDER — SODIUM CHLORIDE 0.9 % IV SOLN
2.0000 g | Freq: Three times a day (TID) | INTRAVENOUS | Status: DC
  Administered 2022-05-04 – 2022-05-05 (×5): 2 g via INTRAVENOUS
  Filled 2022-05-03 (×5): qty 12.5

## 2022-05-03 MED ORDER — ESCITALOPRAM OXALATE 10 MG PO TABS
20.0000 mg | ORAL_TABLET | Freq: Every day | ORAL | Status: DC
Start: 2022-05-04 — End: 2022-05-04

## 2022-05-03 MED ORDER — VANCOMYCIN HCL IN DEXTROSE 1-5 GM/200ML-% IV SOLN
1000.0000 mg | INTRAVENOUS | Status: DC
  Administered 2022-05-04: 1000 mg via INTRAVENOUS
  Filled 2022-05-03: qty 200

## 2022-05-03 MED ORDER — ACETAMINOPHEN 650 MG RE SUPP: 650.0000 mg | Freq: Four times a day (QID) | RECTAL | Status: DC | PRN

## 2022-05-03 MED ORDER — PANTOPRAZOLE SODIUM 40 MG PO TBEC
40.0000 mg | DELAYED_RELEASE_TABLET | Freq: Every day | ORAL | Status: DC
  Administered 2022-05-04 – 2022-05-06 (×3): 40 mg via ORAL
  Filled 2022-05-03 (×3): qty 1

## 2022-05-03 MED ORDER — SODIUM CHLORIDE 0.9% FLUSH
3.0000 mL | Freq: Two times a day (BID) | INTRAVENOUS | Status: DC
  Administered 2022-05-05 – 2022-05-06 (×2): 3 mL via INTRAVENOUS

## 2022-05-03 MED ORDER — FENTANYL CITRATE PF 50 MCG/ML IJ SOSY
25.0000 ug | PREFILLED_SYRINGE | Freq: Once | INTRAMUSCULAR | Status: AC
  Administered 2022-05-03: 25 ug via INTRAVENOUS
  Filled 2022-05-03: qty 1

## 2022-05-03 MED ORDER — ROSUVASTATIN CALCIUM 5 MG PO TABS
5.0000 mg | ORAL_TABLET | Freq: Every day | ORAL | Status: DC
  Administered 2022-05-04 – 2022-05-06 (×3): 5 mg via ORAL
  Filled 2022-05-03 (×3): qty 1

## 2022-05-03 MED ORDER — INSULIN ASPART 100 UNIT/ML IJ SOLN
0.0000 [IU] | INTRAMUSCULAR | Status: DC
  Administered 2022-05-04 – 2022-05-06 (×4): 1 [IU] via SUBCUTANEOUS

## 2022-05-03 MED ORDER — VANCOMYCIN HCL 2000 MG/400ML IV SOLN
2000.0000 mg | Freq: Once | INTRAVENOUS | Status: AC
  Administered 2022-05-03: 2000 mg via INTRAVENOUS
  Filled 2022-05-03: qty 400

## 2022-05-03 MED ORDER — SODIUM CHLORIDE 0.9 % IV SOLN: 250.0000 mL | INTRAVENOUS | Status: DC | PRN

## 2022-05-03 MED ORDER — ACETAMINOPHEN 325 MG PO TABS
650.0000 mg | ORAL_TABLET | Freq: Four times a day (QID) | ORAL | Status: DC | PRN
  Administered 2022-05-04 – 2022-05-06 (×6): 650 mg via ORAL
  Filled 2022-05-03 (×6): qty 2

## 2022-05-03 MED ORDER — GABAPENTIN 300 MG PO CAPS
300.0000 mg | ORAL_CAPSULE | Freq: Two times a day (BID) | ORAL | Status: DC
  Administered 2022-05-04 – 2022-05-06 (×6): 300 mg via ORAL
  Filled 2022-05-03 (×6): qty 1

## 2022-05-03 MED ORDER — SODIUM CHLORIDE 0.9% FLUSH: 3.0000 mL | INTRAVENOUS | Status: DC | PRN

## 2022-05-03 MED ORDER — FENTANYL CITRATE PF 50 MCG/ML IJ SOSY
12.5000 ug | PREFILLED_SYRINGE | INTRAMUSCULAR | Status: DC | PRN
  Administered 2022-05-04 – 2022-05-05 (×3): 50 ug via INTRAVENOUS
  Administered 2022-05-05: 25 ug via INTRAVENOUS
  Administered 2022-05-05 – 2022-05-06 (×4): 50 ug via INTRAVENOUS
  Filled 2022-05-03 (×10): qty 1

## 2022-05-03 NOTE — Assessment & Plan Note (Signed)
Given bradycardia will hold Toprol

## 2022-05-04 ENCOUNTER — Encounter: Payer: Medicare Other | Admitting: Family

## 2022-05-04 DIAGNOSIS — L03115 Cellulitis of right lower limb: Secondary | ICD-10-CM | POA: Diagnosis not present

## 2022-05-04 DIAGNOSIS — R9431 Abnormal electrocardiogram [ECG] [EKG]: Secondary | ICD-10-CM | POA: Diagnosis present

## 2022-05-04 DIAGNOSIS — R7989 Other specified abnormal findings of blood chemistry: Secondary | ICD-10-CM | POA: Diagnosis present

## 2022-05-04 LAB — GLUCOSE, CAPILLARY
Glucose-Capillary: 104 mg/dL — ABNORMAL HIGH (ref 70–99)
Glucose-Capillary: 113 mg/dL — ABNORMAL HIGH (ref 70–99)
Glucose-Capillary: 117 mg/dL — ABNORMAL HIGH (ref 70–99)
Glucose-Capillary: 119 mg/dL — ABNORMAL HIGH (ref 70–99)
Glucose-Capillary: 126 mg/dL — ABNORMAL HIGH (ref 70–99)
Glucose-Capillary: 149 mg/dL — ABNORMAL HIGH (ref 70–99)
Glucose-Capillary: 95 mg/dL (ref 70–99)

## 2022-05-04 LAB — COMPREHENSIVE METABOLIC PANEL
ALT: 11 U/L (ref 0–44)
AST: 19 U/L (ref 15–41)
Albumin: 2.1 g/dL — ABNORMAL LOW (ref 3.5–5.0)
Alkaline Phosphatase: 68 U/L (ref 38–126)
Anion gap: 11 (ref 5–15)
BUN: 13 mg/dL (ref 8–23)
CO2: 22 mmol/L (ref 22–32)
Calcium: 8 mg/dL — ABNORMAL LOW (ref 8.9–10.3)
Chloride: 105 mmol/L (ref 98–111)
Creatinine, Ser: 0.83 mg/dL (ref 0.44–1.00)
GFR, Estimated: >60 mL/min (ref >60)
Glucose, Bld: 101 mg/dL — ABNORMAL HIGH (ref 70–99)
Potassium: 4.2 mmol/L (ref 3.5–5.1)
Sodium: 138 mmol/L (ref 135–145)
Total Bilirubin: 1.2 mg/dL (ref 0.3–1.2)
Total Protein: 5.3 g/dL — ABNORMAL LOW (ref 6.5–8.1)

## 2022-05-04 LAB — TROPONIN I (HIGH SENSITIVITY): Troponin I (High Sensitivity): 7 ng/L (ref <18)

## 2022-05-04 LAB — T4, FREE: Free T4: 0.89 ng/dL (ref 0.61–1.12)

## 2022-05-04 LAB — PREALBUMIN: Prealbumin: 14.4 mg/dL — ABNORMAL LOW (ref 18–38)

## 2022-05-04 LAB — CBC
HCT: 26.9 % — ABNORMAL LOW (ref 36.0–46.0)
Hemoglobin: 8.4 g/dL — ABNORMAL LOW (ref 12.0–15.0)
MCH: 32.4 pg (ref 26.0–34.0)
MCHC: 31.2 g/dL (ref 30.0–36.0)
MCV: 103.9 fL — ABNORMAL HIGH (ref 80.0–100.0)
Platelets: 212 10*3/uL (ref 150–400)
RBC: 2.59 MIL/uL — ABNORMAL LOW (ref 3.87–5.11)
RDW: 15.1 % (ref 11.5–15.5)
WBC: 2.2 10*3/uL — ABNORMAL LOW (ref 4.0–10.5)
nRBC: 0 % (ref 0.0–0.2)

## 2022-05-04 LAB — FERRITIN: Ferritin: 247 ng/mL (ref 11–307)

## 2022-05-04 LAB — FOLATE: Folate: 11.5 ng/mL (ref >5.9)

## 2022-05-04 LAB — IRON AND TIBC
Iron: 51 ug/dL (ref 28–170)
Saturation Ratios: 17 % (ref 10.4–31.8)
TIBC: 295 ug/dL (ref 250–450)
UIBC: 244 ug/dL

## 2022-05-04 LAB — TSH: TSH: 5.705 u[IU]/mL — ABNORMAL HIGH (ref 0.350–4.500)

## 2022-05-04 LAB — VITAMIN B12: Vitamin B-12: 1495 pg/mL — ABNORMAL HIGH (ref 180–914)

## 2022-05-04 MED ORDER — PROSOURCE PLUS PO LIQD
30.0000 mL | Freq: Two times a day (BID) | ORAL | Status: DC
  Administered 2022-05-04 – 2022-05-06 (×4): 30 mL via ORAL
  Filled 2022-05-04 (×4): qty 30

## 2022-05-04 MED ORDER — ADULT MULTIVITAMIN W/MINERALS CH
1.0000 | ORAL_TABLET | Freq: Every day | ORAL | Status: DC
  Administered 2022-05-04 – 2022-05-06 (×3): 1 via ORAL
  Filled 2022-05-04 (×3): qty 1

## 2022-05-04 MED ORDER — TRAMADOL HCL 50 MG PO TABS
50.0000 mg | ORAL_TABLET | Freq: Four times a day (QID) | ORAL | Status: DC | PRN
  Administered 2022-05-04 – 2022-05-05 (×2): 50 mg via ORAL
  Filled 2022-05-04 (×3): qty 1

## 2022-05-04 MED ORDER — JUVEN PO PACK
1.0000 | PACK | Freq: Two times a day (BID) | ORAL | Status: DC
  Administered 2022-05-04 – 2022-05-06 (×4): 1 via ORAL
  Filled 2022-05-04 (×4): qty 1

## 2022-05-04 NOTE — Assessment & Plan Note (Signed)
No CP given abnormal ecG will add on trop  Had recent echo done  Given persistent bradycardia Would benefit from cardiology consult in AM

## 2022-05-04 NOTE — Assessment & Plan Note (Signed)
Check T3 t4 Treat if symptomatic

## 2022-05-04 NOTE — Evaluation (Signed)
Physical Therapy Evaluation Patient Details Name: Kathleen Wiley MRN: [id number] DOB: May 07, 1946 Today's Date: 05/04/2022  History of Present Illness  Pt is a 76 y/o female presenting with swelling, wound dehiscence, and cellulitis of RLE. Pt is status post debridement for hematoma on R calf. Possible need for further I&D during this admission. Pt with recent admission 6/12-6/20 due to septic cellulitis of RLE requiring I&D, wound vac placement. PMH: DM 2, hypertension, HLD, recent streptococcal bacteremia, CHF.  Clinical Impression  Pt agreeable to physical therapy evaluation/treatment session. Pt performing bed mobility without physical assistance. Pt performing transfers and gait at CGA level but only ambulating short distances due to decreased endurance and weakness. Pt currently presents with functional limitations secondary to impairments listed in PT problem list. Pt to benefit from skilled, acute care physical therapy interventions to maximize pt's independence level and quality of life. Pt has concerns about having available assistance upon discharge and would like to go to rehab vs home health PT.     Recommendations for follow up therapy are one component of a multi-disciplinary discharge planning process, led by the attending physician.  Recommendations may be updated based on patient status, additional functional criteria and insurance authorization.  Follow Up Recommendations Home health PT (if pt can have assistance from family during the day; otherwise SNF which she is requesting)      Assistance Recommended at Discharge Intermittent Supervision/Assistance  Patient can return home with the following  A little help with walking and/or transfers;A little help with bathing/dressing/bathroom;Assistance with cooking/housework;Assist for transportation;Help with stairs or ramp for entrance    Equipment Recommendations    Recommendations for Other Services       Functional Status  Assessment Patient has had a recent decline in their functional status and demonstrates the ability to make significant improvements in function in a reasonable and predictable amount of time.     Precautions / Restrictions Precautions Precautions: Fall;Other (comment) Precaution Comments: watch HR Restrictions Weight Bearing Restrictions: No      Mobility  Bed Mobility Overal bed mobility: Needs Assistance Bed Mobility: Supine to Sit, Sit to Supine     Supine to sit: Supervision, HOB elevated Sit to supine: Supervision        Transfers Overall transfer level: Needs assistance Equipment used: Rolling walker (2 wheels) Transfers: Sit to/from Stand Sit to Stand: Min guard           General transfer comment: Pt performed 5 consecutive sit <> stands from chair with RW and standby A following gait training. Pt also provided with CGA while standing to work on static standing balance while she required assistance to clean up after BM.    Ambulation/Gait Ambulation/Gait assistance: Min guard Gait Distance (Feet): 35 Feet Assistive device: Rolling walker (2 wheels) Gait Pattern/deviations: Step-to pattern, Decreased step length - left, Decreased step length - right, Decreased stance time - right, Antalgic   Gait velocity interpretation: <1.31 ft/sec, indicative of household ambulator   General Gait Details: Pt with slow and antalgic gait. Pt performed ambulation x 20 feet and 15 feet respectively with rest break 2/2 limited endurance. Pt provided with cues for COG position within RW.  Stairs            Wheelchair Mobility    Modified Rankin (Stroke Patients Only)       Balance Overall balance assessment: Needs assistance Sitting-balance support: No upper extremity supported, Feet supported Sitting balance-Leahy Scale: Good     Standing balance support: During functional  activity, Single extremity supported, Bilateral upper extremity supported Standing  balance-Leahy Scale: Fair Standing balance comment: able to stand without UE support briefly when in bathroom                             Pertinent Vitals/Pain Pain Assessment Pain Assessment: 0-10 Pain Score: 8  Pain Location: R LE in dependent position or with WB Pain Descriptors / Indicators: Throbbing, Discomfort, Sore, Grimacing Pain Intervention(s): Limited activity within patient's tolerance, Monitored during session, Patient requesting pain meds-RN notified    Home Living Family/patient expects to be discharged to:: Private residence Living Arrangements: Spouse/significant other Available Help at Discharge: Family;Available PRN/intermittently (Pt states she does not think returning to her son and DTR in laws is an option following this hospital stay. Pt states her husband and his children work and are unable to assist her. Pt states she will be seeing if her neice can help her at her residence) Type of Home: House Home Access: Stairs to enter Entrance Stairs-Rails: Can reach both Entrance Stairs-Number of Steps: 2   Home Layout: Two level;Laundry or work area in basement;Able to live on main level with bedroom/bathroom Home Equipment: Cane - single point;Grab bars - tub/shower;Rollator (4 wheels);Adaptive equipment;Rolling Walker (2 wheels);BSC/3in1      Prior Function Prior Level of Function : Independent/Modified Independent;Driving             Mobility Comments: used SPC to go out of the house only previously. After last admission, pt has been using RW for mobility to offload painful R LE ADLs Comments: independent for all ADLs/IADLs. uses AE for LB ADLs. After last admission, DIL providing light assist for ADLs prn, as well as IADLs. Pt unable to wipe herself after BM or prepare meals.     Hand Dominance   Dominant Hand: Right    Extremity/Trunk Assessment   Upper Extremity Assessment Upper Extremity Assessment: Generalized weakness    Lower  Extremity Assessment Lower Extremity Assessment:  (Grossly 4-/5 in major muscle groups)    Cervical / Trunk Assessment Cervical / Trunk Assessment: Normal  Communication   Communication: No difficulties  Cognition Arousal/Alertness: Awake/alert Behavior During Therapy: WFL for tasks assessed/performed Overall Cognitive Status: Impaired/Different from baseline Area of Impairment: Awareness                           Awareness: Emergent            General Comments General comments (skin integrity, edema, etc.): HR bradycardic at rest and increased to 90-100s with activity and afib noted on monitor- RN aware    Exercises     Assessment/Plan    PT Assessment Patient needs continued PT services  PT Problem List Decreased strength;Decreased activity tolerance;Decreased balance;Decreased knowledge of use of DME;Decreased safety awareness;Pain       PT Treatment Interventions DME instruction;Gait training;Stair training;Functional mobility training;Therapeutic activities;Therapeutic exercise;Balance training;Neuromuscular re-education;Patient/family education    PT Goals (Current goals can be found in the Care Plan section)  Acute Rehab PT Goals Patient Stated Goal: Go to rehab PT Goal Formulation: With patient Time For Goal Achievement: 05/18/22 Potential to Achieve Goals: Good    Frequency Min 3X/week     Co-evaluation               AM-PAC PT "6 Clicks" Mobility  Outcome Measure Help needed turning from your back to your side while in a flat  bed without using bedrails?: None Help needed moving from lying on your back to sitting on the side of a flat bed without using bedrails?: None Help needed moving to and from a bed to a chair (including a wheelchair)?: A Little Help needed standing up from a chair using your arms (e.g., wheelchair or bedside chair)?: A Little Help needed to walk in hospital room?: A Little Help needed climbing 3-5 steps with a  railing? : A Lot 6 Click Score: 19    End of Session Equipment Utilized During Treatment: Gait belt Activity Tolerance: Patient limited by fatigue;Patient limited by pain Patient left: in chair;with call bell/phone within reach Nurse Communication: Patient requests pain meds;Mobility status PT Visit Diagnosis: Other abnormalities of gait and mobility (R26.89);Pain;Muscle weakness (generalized) (M62.81) Pain - Right/Left: Right Pain - part of body: Leg    Time: 1400 (pt seen for additional time this AM for subjective exam and bed mobility prior to pt reporting shakiness and dizziness so further mobility was deferred and RN notified)-1433 PT Time Calculation (min) (ACUTE ONLY): 33 min   Charges:   PT Evaluation $PT Eval Moderate Complexity: 1 Mod PT Treatments $Gait Training: 8-22 mins $Therapeutic Activity: 8-22 mins        Tana Coast, PT   [employer] 05/04/2022, 2:54 PM

## 2022-05-04 NOTE — Progress Notes (Signed)
Occupational Therapy Evaluation  Clinical Impression: Pt known to this OT from previous admission. Pt typically Modified Independent with all ADLs, IADLs and mobility using cane. Pt resides with husband and son. After most recent admission, pt discharged to son and DIL's home for family to assist as needed. Pt reports using RW for household mobility and intermittent light assist for ADLs. Pt presents now close to most recent baseline with min guard to mobilize to bathroom using RW, Setup for UB ADLs and Min A for LB ADLs (typically uses AE for LB ADL). Pt endorses pain increase with dependent position (per chart, pt may have been resting in this position often at home) and WB though still able to complete tasks with limited assist. Will continue to follow acutely though anticipate pt will continue to progress to return home with HHOT follow-up. Noted pt with difficulty obtaining HH services in [facility] - consider DC to primary residence in [place] pending assist availability and functional progression.  HR 40s-90s during session.   05/04/22 1000  OT Visit Information  Last OT Received On 05/04/22  Assistance Needed +1  History of Present Illness Pt is a 76 y/o female presenting with swelling, wound dehiscence, and cellulitis of RLE. Possible need for further I&D during this admission. Pt with recent admission 6/12-6/20 due to septic cellulitis of RLE requiring I&D, wound vac placement. PMH: DM 2, hypertension, HLD, recent streptococcal bacteremia, CHF  Precautions  Precautions Fall;Other (comment)  Precaution Comments watch HR  Restrictions  Weight Bearing Restrictions No  Home Living  Family/patient expects to be discharged to: Private residence (Simultaneous filing. User may not have seen previous data.)  Living Arrangements Spouse/significant other (Simultaneous filing. User may not have seen previous data.)  Available Help at Discharge Family;Available 24 hours/day (Simultaneous  filing. User may not have seen previous data.)  Type of Home House (Simultaneous filing. User may not have seen previous data.)  Home Access Stairs to enter (Simultaneous filing. User may not have seen previous data.)  Entrance Stairs-Number of Steps 2 (Simultaneous filing. User may not have seen previous data.)  Entrance Stairs-Rails Can reach both (Simultaneous filing. User may not have seen previous data.)  Home Layout Two level;Laundry or work area in basement;Able to live on main level with bedroom/bathroom (Designer, industrial/product. User may not have seen previous data.)  Alternate Level Stairs-Number of Steps flight  Bathroom Shower/Tub Tub/shower unit (Simultaneous filing. User may not have seen previous data.)  Dealer - single point;Grab bars - tub/shower;Rollator (4 wheels);Adaptive equipment;Rolling Walker (2 wheels) (Simultaneous filing. User may not have seen previous data.)  Adaptive Equipment Reacher;Sock aid;Long-handled shoe horn  Additional Comments Pt discharged to son and DIL law after last admission. Daughter works from home and can assist pt  Prior Function  Prior Level of Function  Independent/Modified Independent;Driving (Simultaneous filing. User may not have seen previous data.)  Mobility Comments used SPC to go out of the house only previously. After last admission, pt has been using RW for mobility to offload painful R LE (Simultaneous filing. User may not have seen previous data.)  ADLs Comments independent for all ADLs/IADLs. uses AE for LB ADLs. After last admission, DIL providing light assist for ADLs prn, as well as IADLs (Simultaneous filing. User may not have seen previous data.)  Communication  Communication No difficulties  Pain Assessment  Pain Assessment Faces (Simultaneous filing. User may not have seen previous data.)  Pain Score 2  Faces Pain Scale  4  Pain Location R LE in dependent position or with  WB (Simultaneous filing. User may not have seen previous data.)  Pain Descriptors / Indicators Throbbing;Discomfort;Sore;Grimacing (Simultaneous filing. User may not have seen previous data.)  Pain Intervention(s) Monitored during session;Premedicated before session;Limited activity within patient's tolerance;Repositioned (Simultaneous filing. User may not have seen previous data.)  Cognition  Arousal/Alertness Awake/alert  Behavior During Therapy WFL for tasks assessed/performed  Overall Cognitive Status Impaired/Different from baseline  Area of Impairment Awareness  Awareness Emergent  General Comments Pleasant, WFL for basic tasks. Pt with some decreased awareness and asking about if she needs rehab at DC though moving fairly well  Upper Extremity Assessment  Upper Extremity Assessment Generalized weakness  Lower Extremity Assessment  Lower Extremity Assessment Defer to PT evaluation  Cervical / Trunk Assessment  Cervical / Trunk Assessment Normal  Vision- History  Ability to See in Adequate Light 0 Adequate  Patient Visual Report No change from baseline  Vision- Assessment  Vision Assessment? No apparent visual deficits  ADL  Overall ADL's  Needs assistance/impaired  Eating/Feeding Sitting;Independent  Grooming Supervision/safety;Standing;Oral care  Grooming Details (indicate cue type and reason) able to stand without UE support at sink, tolerate WB through LE for oral care tasks  Upper Body Bathing Set up;Sitting  Lower Body Bathing Minimal assistance;Sitting/lateral leans;Sit to/from stand  Upper Body Dressing  Set up;Sitting  Lower Body Dressing Minimal assistance;Sit to/from stand  Lower Body Dressing Details (indicate cue type and reason) pt typically uses AE for LB ADLs at baseline. min A if not using AE  Toilet Transfer Min guard;Ambulation;Rolling walker (2 wheels)  Toileting- Clothing Manipulation and Hygiene Minimal assistance;Sitting/lateral lean;Sit to/from stand   Functional mobility during ADLs Min guard;Rolling walker (2 wheels)  General ADL Comments Pt with increased pain with WB/dependent position for R LE but able to mobilize with limited assist using RW. Very light assist for LB ADLs though typically uses AE for these tasks. Emphasized elevation of LE when resting to decrease swelling (noted in chart, pt may have been resting with LE in dependent position at home)  Bed Mobility  Overal bed mobility Needs Assistance  Bed Mobility Supine to Sit;Sit to Supine  Supine to sit Supervision;HOB elevated  Sit to supine Supervision  Transfers  Overall transfer level Needs assistance  Equipment used Rolling walker (2 wheels)  Transfers Sit to/from Stand  Sit to Stand Min assist  General transfer comment light Min A requested from pt to stand from bedside though close to min guard.  Balance  Overall balance assessment Needs assistance  Sitting-balance support No upper extremity supported;Feet supported  Sitting balance-Leahy Scale Good  Standing balance support During functional activity;Single extremity supported;Bilateral upper extremity supported  Standing balance-Leahy Scale Fair  Standing balance comment able to stand without UE support at sink, BUE support for mobility helpful to offload painful LE  General Comments  General comments (skin integrity, edema, etc.) HR noted to be in 40s resting in bed, sustained 80-90s with activity though a fib noted on monitor - RN aware and monitoring  OT - End of Session  Equipment Utilized During Treatment Rolling walker (2 wheels)  Activity Tolerance Patient tolerated treatment well  Patient left in bed;with call bell/phone within reach;Other (comment) (with PT present)  Nurse Communication Mobility status;Other (comment) (HR)  OT Assessment  OT Recommendation/Assessment Patient needs continued OT Services  OT Visit Diagnosis Unsteadiness on feet (R26.81);Other abnormalities of gait and mobility  (R26.89);Pain  Pain - Right/Left Right  Pain -  part of body Leg  OT Problem List Decreased strength;Decreased activity tolerance;Impaired balance (sitting and/or standing);Decreased knowledge of use of DME or AE;Pain  OT Plan  OT Frequency (ACUTE ONLY) Min 2X/week  OT Treatment/Interventions (ACUTE ONLY) Self-care/ADL training;Therapeutic exercise;Energy conservation;DME and/or AE instruction;Therapeutic activities;Patient/family education;Balance training  AM-PAC OT "6 Clicks" Daily Activity Outcome Measure (Version 2)  Help from another person eating meals? 4  Help from another person taking care of personal grooming? 3  Help from another person toileting, which includes using toliet, bedpan, or urinal? 3  Help from another person bathing (including washing, rinsing, drying)? 3  Help from another person to put on and taking off regular upper body clothing? 3  Help from another person to put on and taking off regular lower body clothing? 3  6 Click Score 19  Progressive Mobility  What is the highest level of mobility based on the progressive mobility assessment? Level 4 (Walks with assist in room) - Balance while marching in place and cannot step forward and back - Complete  Activity Ambulated with assistance to bathroom  OT Recommendation  Follow Up Recommendations Home health OT  Assistance recommended at discharge Intermittent Supervision/Assistance  Patient can return home with the following Assistance with cooking/housework;Assist for transportation;Help with stairs or ramp for entrance;A little help with walking and/or transfers;A little help with bathing/dressing/bathroom  Functional Status Assessent Patient has had a recent decline in their functional status and demonstrates the ability to make significant improvements in function in a reasonable and predictable amount of time.  OT Equipment None recommended by OT  Individuals Consulted  Consulted and Agree with Results and  Recommendations Patient  Acute Rehab OT Goals  Patient Stated Goal pain control, decrease swelling  OT Goal Formulation With patient  Time For Goal Achievement 05/18/22  Potential to Achieve Goals Good  OT Time Calculation  OT Start Time (ACUTE ONLY) 0947  OT Stop Time (ACUTE ONLY) 1006  OT Time Calculation (min) 19 min  OT General Charges  $OT Visit 1 Visit  OT Evaluation  $OT Eval Moderate Complexity 1 Mod  Written Expression  Dominant Hand Right

## 2022-05-05 DIAGNOSIS — R001 Bradycardia, unspecified: Secondary | ICD-10-CM | POA: Diagnosis not present

## 2022-05-05 DIAGNOSIS — E1140 Type 2 diabetes mellitus with diabetic neuropathy, unspecified: Secondary | ICD-10-CM | POA: Diagnosis not present

## 2022-05-05 DIAGNOSIS — L03115 Cellulitis of right lower limb: Secondary | ICD-10-CM | POA: Diagnosis not present

## 2022-05-05 LAB — BASIC METABOLIC PANEL
Anion gap: 8 (ref 5–15)
BUN: 14 mg/dL (ref 8–23)
CO2: 21 mmol/L — ABNORMAL LOW (ref 22–32)
Calcium: 8 mg/dL — ABNORMAL LOW (ref 8.9–10.3)
Chloride: 109 mmol/L (ref 98–111)
Creatinine, Ser: 0.76 mg/dL (ref 0.44–1.00)
GFR, Estimated: >60 mL/min (ref >60)
Glucose, Bld: 113 mg/dL — ABNORMAL HIGH (ref 70–99)
Potassium: 3.9 mmol/L (ref 3.5–5.1)
Sodium: 138 mmol/L (ref 135–145)

## 2022-05-05 LAB — CBC WITH DIFFERENTIAL/PLATELET
Abs Immature Granulocytes: 0.03 10*3/uL (ref 0.00–0.07)
Basophils Absolute: 0 10*3/uL (ref 0.0–0.1)
Basophils Relative: 1 %
Eosinophils Absolute: 0.1 10*3/uL (ref 0.0–0.5)
Eosinophils Relative: 3 %
HCT: 27.1 % — ABNORMAL LOW (ref 36.0–46.0)
Hemoglobin: 8.4 g/dL — ABNORMAL LOW (ref 12.0–15.0)
Immature Granulocytes: 1 %
Lymphocytes Relative: 45 %
Lymphs Abs: 1.1 10*3/uL (ref 0.7–4.0)
MCH: 32.3 pg (ref 26.0–34.0)
MCHC: 31 g/dL (ref 30.0–36.0)
MCV: 104.2 fL — ABNORMAL HIGH (ref 80.0–100.0)
Monocytes Absolute: 0.2 10*3/uL (ref 0.1–1.0)
Monocytes Relative: 9 %
Neutro Abs: 1 10*3/uL — ABNORMAL LOW (ref 1.7–7.7)
Neutrophils Relative %: 41 %
Platelets: 210 10*3/uL (ref 150–400)
RBC: 2.6 MIL/uL — ABNORMAL LOW (ref 3.87–5.11)
RDW: 15.4 % (ref 11.5–15.5)
WBC: 2.4 10*3/uL — ABNORMAL LOW (ref 4.0–10.5)
nRBC: 0 % (ref 0.0–0.2)

## 2022-05-05 LAB — GLUCOSE, CAPILLARY
Glucose-Capillary: 107 mg/dL — ABNORMAL HIGH (ref 70–99)
Glucose-Capillary: 112 mg/dL — ABNORMAL HIGH (ref 70–99)
Glucose-Capillary: 114 mg/dL — ABNORMAL HIGH (ref 70–99)
Glucose-Capillary: 115 mg/dL — ABNORMAL HIGH (ref 70–99)
Glucose-Capillary: 121 mg/dL — ABNORMAL HIGH (ref 70–99)
Glucose-Capillary: 133 mg/dL — ABNORMAL HIGH (ref 70–99)

## 2022-05-05 LAB — T3: T3, Total: 115 ng/dL (ref 71–180)

## 2022-05-05 MED ORDER — METOPROLOL SUCCINATE ER 25 MG PO TB24
12.5000 mg | ORAL_TABLET | Freq: Every day | ORAL | Status: DC
  Administered 2022-05-05 – 2022-05-06 (×2): 12.5 mg via ORAL
  Filled 2022-05-05 (×2): qty 1

## 2022-05-05 MED ORDER — SODIUM CHLORIDE 0.9 % IV SOLN
2.0000 g | INTRAVENOUS | Status: DC
  Administered 2022-05-05: 2 g via INTRAVENOUS
  Filled 2022-05-05: qty 20

## 2022-05-05 NOTE — Plan of Care (Signed)
  Problem: Education: Goal: Ability to describe self-care measures that may prevent or decrease complications (Diabetes Survival Skills Education) will improve Outcome: Progressing   Problem: Coping: Goal: Ability to adjust to condition or change in health will improve 05/05/2022 1113 by Wyn Quaker, LPN Outcome: Progressing 05/05/2022 0958 by Wyn Quaker, LPN Outcome: Progressing   Problem: Health Behavior/Discharge Planning: Goal: Ability to identify and utilize available resources and services will improve Outcome: Progressing   Problem: Nutritional: Goal: Maintenance of adequate nutrition will improve Outcome: Progressing   Problem: Tissue Perfusion: Goal: Adequacy of tissue perfusion will improve 05/05/2022 1113 by Wyn Quaker, LPN Outcome: Progressing 05/05/2022 0958 by Wyn Quaker, LPN Outcome: Progressing   Problem: Education: Goal: Knowledge of General Education information will improve Description: Including pain rating scale, medication(s)/side effects and non-pharmacologic comfort measures 05/05/2022 1113 by Wyn Quaker, LPN Outcome: Progressing 05/05/2022 0958 by Wyn Quaker, LPN Outcome: Progressing

## 2022-05-05 NOTE — Progress Notes (Signed)
Physical Therapy Treatment Patient Details Name: Kathleen Wiley MRN: [id number] DOB: 1946/01/11 Today's Date: 05/05/2022   History of Present Illness Pt is a 76 y/o female presenting with swelling, wound dehiscence, and cellulitis of RLE. Pt is status post debridement for hematoma on R calf. Possible need for further I&D during this admission. Pt with recent admission 6/12-6/20 due to septic cellulitis of RLE requiring I&D, wound vac placement. PMH: DM 2, hypertension, HLD, recent streptococcal bacteremia, CHF.    PT Comments    Pt with decreased motivation but willing to participate. Pt able to increase gait distance today although overall decreased endurance present and pt with decreased weight acceptance on R LE. Pt also did well with initiation of stair training.   Recommendations for follow up therapy are one component of a multi-disciplinary discharge planning process, led by the attending physician.  Recommendations may be updated based on patient status, additional functional criteria and insurance authorization.  Follow Up Recommendations  Home health PT (if pt can have assistance from family during the day; otherwise SNF which she is requesting)     Assistance Recommended at Discharge Intermittent Supervision/Assistance  Patient can return home with the following A little help with walking and/or transfers;A little help with bathing/dressing/bathroom;Assistance with cooking/housework;Assist for transportation;Help with stairs or ramp for entrance   Equipment Recommendations       Recommendations for Other Services       Precautions / Restrictions Precautions Precautions: Fall;Other (comment) Precaution Comments: watch HR Restrictions Weight Bearing Restrictions: No     Mobility  Bed Mobility Overal bed mobility: Needs Assistance Bed Mobility: Supine to Sit     Supine to sit: Supervision, HOB elevated Sit to supine: Supervision        Transfers Overall  transfer level: Needs assistance Equipment used: Rolling walker (2 wheels) Transfers: Sit to/from Stand Sit to Stand: Min guard           General transfer comment: Min cues for hand placements    Ambulation/Gait Ambulation/Gait assistance: Min guard Gait Distance (Feet): 120 Feet Assistive device: Rolling walker (2 wheels) Gait Pattern/deviations: Step-to pattern, Decreased step length - left, Decreased step length - right, Decreased stance time - right, Antalgic   Gait velocity interpretation: <1.31 ft/sec, indicative of household ambulator   General Gait Details: Pt able to increase gait distance for 60 feet x 2 with chair following. Pt performed modified three point pattern leading with R LE. Fatigue present.   Stairs Stairs: Yes Stairs assistance: Min guard Stair Management: Two rails, Step to pattern, Forwards Number of Stairs: 3 General stair comments: Pt ascended and descended 3 steps with bilateral rail use. Pt with fairly good stability but decreased endurance present.   Wheelchair Mobility    Modified Rankin (Stroke Patients Only)       Balance Overall balance assessment: Needs assistance Sitting-balance support: No upper extremity supported, Feet supported Sitting balance-Leahy Scale: Good     Standing balance support: During functional activity, Single extremity supported, Bilateral upper extremity supported Standing balance-Leahy Scale: Fair Standing balance comment: able to stand without UE support for short time periods                            Cognition Arousal/Alertness: Awake/alert Behavior During Therapy: WFL for tasks assessed/performed Overall Cognitive Status: Within Functional Limits for tasks assessed  Exercises General Exercises - Lower Extremity Hip ABduction/ADduction: Right, 10 reps, Supine (12 reps on left) Straight Leg Raises: Right, 10 reps, Supine (12 reps  on left)    General Comments General comments (skin integrity, edema, etc.): VSS on RA; afib noted on monitor but 80-90s HR and RN aware      Pertinent Vitals/Pain Pain Assessment Pain Assessment: No/denies pain (at rest; present with mvmt) Pain Location: R LE in dependent position or with WB Pain Descriptors / Indicators: Throbbing, Discomfort, Sore, Grimacing Pain Intervention(s): Limited activity within patient's tolerance, Monitored during session    Home Living                          Prior Function            PT Goals (current goals can now be found in the care plan section) Acute Rehab PT Goals Patient Stated Goal: Go to rehab PT Goal Formulation: With patient Time For Goal Achievement: 05/18/22 Potential to Achieve Goals: Good Progress towards PT goals: Progressing toward goals    Frequency    Min 3X/week      PT Plan Current plan remains appropriate    Co-evaluation              AM-PAC PT "6 Clicks" Mobility   Outcome Measure  Help needed turning from your back to your side while in a flat bed without using bedrails?: None Help needed moving from lying on your back to sitting on the side of a flat bed without using bedrails?: None Help needed moving to and from a bed to a chair (including a wheelchair)?: A Little Help needed standing up from a chair using your arms (e.g., wheelchair or bedside chair)?: A Little Help needed to walk in hospital room?: A Little Help needed climbing 3-5 steps with a railing? : A Little 6 Click Score: 20    End of Session Equipment Utilized During Treatment: Gait belt Activity Tolerance: Patient limited by fatigue;Patient limited by pain Patient left: in chair;with call bell/phone within reach Nurse Communication: Mobility status PT Visit Diagnosis: Other abnormalities of gait and mobility (R26.89);Pain;Muscle weakness (generalized) (M62.81) Pain - Right/Left: Right Pain - part of body: Leg     Time:  6759-1638 PT Time Calculation (min) (ACUTE ONLY): 28 min  Charges:  $Gait Training: 8-22 mins $Therapeutic Activity: 8-22 mins                    Donna Bernard, PT    [facility] 05/05/2022, 12:33 PM

## 2022-05-05 NOTE — TOC Progression Note (Addendum)
Transition of Care [facility]) - Progression Note    Patient Details  Name: Kathleen Wiley MRN: [id number] Date of Birth: 1945-12-06  Transition of Care [facility]) CM/SW [place], [place] Phone Number: 05/05/2022, 12:16 PM  Clinical Narrative:    CSW was notified by MD that medical team would like for pt to pursue SNF for wound care and therapy. Barriers were discussed. CSW met with pt at bedside and she is agreeable, she has been to [facility] before and stated she would go back. Also open to a further fax out as long as the facilities are "Nice and clean". Pt states she will return home with spouse and adult children after DC from SNF. Authorization initiated. CSW will send out referrals and continue to follow for DC needs.   Expected Discharge Plan: [place] Barriers to Discharge: Continued Medical Work up, SNF Pending bed offer, Ship broker  Expected Discharge Plan and Services Expected Discharge Plan: [place] Choice: [place] arrangements for the past 2 months: Single Family Home                                       Social Determinants of Health (SDOH) Interventions    Readmission Risk Interventions     No data to display

## 2022-05-05 NOTE — Progress Notes (Addendum)
PROGRESS NOTE   Kathleen Wiley  LKT:[id number] DOB: 14-Aug-1946 DOA: 05/03/2022 PCP: Jonathon Jordan, MD  Brief Narrative:  76 year old  female with DM TY 2 HTN HLD BMI 37 OSA noncompliant with CPAP who has had previous episodes of left lower extremity cellulitis.  Recent hospitalization 6/12 through 04/27/2022 with right lower extremity cellulitis streptococcal bacteremia seen by ID orthopedics and underwent operative debridement of hematoma right calf with both medial and lateral incisions cellulitis by Dr. Sharol Given and Rx initially penicillin Zyvox-->Augmentin on discharge She was sent home on a wound VAC and did not want skilled placement She returned to visit Dr. Sharol Given 6/26 and was sent to the emergency room as she has been unable to take care of herself with increasing pain worsening ability to transfer Dr. Sharol Given saw the patient in consult initially recommended potentially operative management but subsequently felt cellulitis was improving and may be able to manage medically with IV antibiotics for several days and then probable skilled discharge   Hospital-Problem based course  Cellulitis in the setting of recent large surgery debridement hematoma right calf Prior culture grew Streptococcus -Continue cefepime and vancomycin and de-escalate upon d/c Pain control first choice Tylenol second choice tramadol 50 every 6 as needed added -appreciate Dr. 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 Sharol Given   Subjective: Was not elevating leg when she left the hospital   Objective: Vitals:   05/04/22 2226 05/04/22 2325 05/05/22 0430 05/05/22 0744  BP: (!) 128/47 (!) 125/46 (!) 129/56 (!) 126/54  Pulse: (!) 50 (!) 43 70 69  Resp: '16 18 17   '$ Temp: 98.3 F (36.8 C)  98 F (36.7 C) 98.4 F (36.9 C)  TempSrc: Oral  Oral Oral  SpO2: 100% 96% 97% 96%  Weight:      Height:        Intake/Output Summary (Last 24 hours) at 05/05/2022 1141 Last data filed at 05/05/2022 0931 Gross per 24 hour  Intake 3 ml  Output --  Net 3 ml   Filed Weights   05/03/22 1826  Weight: 106.6 kg    Examination:   General: Appearance:    Obese female in no acute distress     Lungs:     respirations unlabored  Heart:    Normal heart rate. .   MS:   All extremities are intact.   Neurologic:   Awake, alert     Data Reviewed: personally reviewed   CBC    Component Value Date/Time   WBC 2.4 (L) 05/05/2022 0301   RBC 2.60 (L) 05/05/2022 0301   HGB 8.4 (L) 05/05/2022 0301   HCT 27.1 (L) 05/05/2022 0301   PLT 210 05/05/2022 0301   MCV 104.2 (H) 05/05/2022 0301   MCH 32.3 05/05/2022 0301  MCHC 31.0 05/05/2022 0301   RDW 15.4 05/05/2022 0301   LYMPHSABS 1.1 05/05/2022 0301   MONOABS 0.2 05/05/2022 0301   EOSABS 0.1 05/05/2022 0301   BASOSABS 0.0 05/05/2022 0301      Latest Ref Rng & Units 05/05/2022    3:01 AM 05/04/2022    3:24 AM 05/03/2022    6:31 PM  CMP  Glucose 70 - 99 mg/dL 113  101  146   BUN 8 - 23 mg/dL '14  13  12   '$ Creatinine 0.44 - 1.00 mg/dL 0.76  0.83  0.96   Sodium 135 - 145 mmol/L 138  138  135   Potassium 3.5 - 5.1 mmol/L 3.9  4.2  3.9   Chloride 98 - 111 mmol/L 109  105  102   CO2 22 - 32 mmol/L '21  22  23   '$ Calcium  8.9 - 10.3 mg/dL 8.0  8.0  8.2   Total Protein 6.5 - 8.1 g/dL  5.3  6.5   Total Bilirubin 0.3 - 1.2 mg/dL  1.2  1.0   Alkaline Phos 38 - 126 U/L  68  87   AST 15 - 41 U/L  19  21   ALT 0 - 44 U/L  11  13      Radiology Studies: DG Tibia/Fibula Right  Result Date: 05/03/2022 CLINICAL DATA:  Cellulitis, pain EXAM: RIGHT TIBIA AND FIBULA - 2 VIEW COMPARISON:  04/19/2022 FINDINGS: Partially imaged right total knee arthroplasty hardware without apparent complication. There is no evidence of fracture or other focal bone lesions. Generalized soft tissue edema. Scattered soft tissue calcifications. No soft tissue gas. IMPRESSION: 1. Generalized soft tissue edema. No soft tissue gas. 2. No acute osseous abnormality of the right tibia-fibula. Electronically Signed   By: Davina Poke D.O.   On: 05/03/2022 19:06     Scheduled Meds:  (feeding supplement) PROSource Plus  30 mL Oral BID BM   gabapentin  300 mg Oral BID   insulin aspart  0-9 Units Subcutaneous Q4H   multivitamin with minerals  1 tablet Oral Daily   nutrition supplement (JUVEN)  1 packet Oral BID BM   pantoprazole  40 mg Oral Daily   rosuvastatin  5 mg Oral Daily   sodium chloride flush  3 mL Intravenous Q12H   Continuous Infusions:  sodium chloride     ceFEPime (MAXIPIME) IV 2 g (05/05/22 0828)   vancomycin 1,000 mg (05/04/22 2236)     LOS: 2 days   Time spent: 30 min  Geradine Girt, DO [facility]   05/05/2022, 11:41 AM

## 2022-05-05 NOTE — Progress Notes (Signed)
Patient ID: Kathleen Wiley, female   DOB: Aug 11, 1946, 76 y.o.   MRN: [id number] Patient's right leg is showing significant improvement with elevation and IV antibiotics.  She does have superficial necrosis of the skin right leg.  Discussed with the patient recommendation to proceed with skilled nursing placement.  Discussed that the complications that she is being treated for now are secondary to being at home with her leg dependent.  Anticipate patient could discharge on oral antibiotics to skilled nursing when a bed is available.

## 2022-05-05 NOTE — Plan of Care (Signed)
  Problem: Coping: Goal: Ability to adjust to condition or change in health will improve Outcome: Progressing   Problem: Health Behavior/Discharge Planning: Goal: Ability to identify and utilize available resources and services will improve Outcome: Progressing   Problem: Nutritional: Goal: Maintenance of adequate nutrition will improve Outcome: Progressing   Problem: Tissue Perfusion: Goal: Adequacy of tissue perfusion will improve Outcome: Progressing   Problem: Education: Goal: Knowledge of General Education information will improve Description: Including pain rating scale, medication(s)/side effects and non-pharmacologic comfort measures Outcome: Progressing

## 2022-05-05 NOTE — Progress Notes (Signed)
O2 sats periodically dropping to low 80's- while sleeping-questioned pt about hx of sleep apnea- nothing diagnosed- placed O2 1L [place].no further drops

## 2022-05-05 NOTE — NC FL2 (Signed)
[place] LEVEL OF CARE SCREENING TOOL     IDENTIFICATION  Patient Name: Kathleen Wiley Birthdate: 10/25/1946 Sex: female Admission Date (Current Location): 05/03/2022  [facility] and [place] Number:  Herbalist and Address:  [facility] [place], [place] [address] [place],  [address]      Provider Number: [id number]  Attending Physician Name and Address:  Geradine Girt, DO  Relative Name and Phone Number:  Kaylana Fenstermacher, [phone]    Current Level of Care: Hospital Recommended Level of Care: [place] Prior Approval Number:    Date Approved/Denied:   PASRR Number: [id number]  Discharge Plan: SNF    Current Diagnoses: Patient Active Problem List   Diagnosis Date Noted   Prolonged QT interval 05/04/2022   Elevated TSH 05/04/2022   Abnormal ECG 05/04/2022   Cellulitis 63/89/3734   Diastolic dysfunction with chronic heart failure ([place]) 05/03/2022   Sinus bradycardia 05/03/2022   Group G streptococcal infection 04/21/2022   Sepsis ([place]) 04/20/2022   Cellulitis of right lower extremity 04/20/2022   Gallstone pancreatitis 03/15/2021   Acute cholecystitis 03/15/2021   Controlled type 2 diabetes mellitus with neuropathy ([place]) 03/15/2021   Obesity with body mass index (BMI) of 30.0 to 39.9 03/15/2021   Acute gallstone pancreatitis 03/15/2021   Abnormal cervical Papanicolaou smear 02/18/2021   Abnormal liver function tests 02/18/2021   Acquired hallux valgus 02/18/2021   Acute hypoxemic respiratory failure ([place]) 02/18/2021   Aneurysm of thoracic aorta ([place]) 02/18/2021   Anxiety 02/18/2021   Aortic root dilatation ([place]) 02/18/2021   Aortic valve disorder 02/18/2021   Arthropathy 02/18/2021   Callosity 02/18/2021   Cardiomegaly 02/18/2021   Chest discomfort 02/18/2021   Chronic kidney disease (CKD) stage G2/A1, mildly decreased glomerular filtration rate (GFR) between 60-89 mL/min/1.73 square meter and  albuminuria creatinine ratio less than 30 mg/g 02/18/2021   Chronic kidney disease, stage 2 (mild) 02/18/2021   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   COVID-19 virus infection 02/18/2021   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   Edema 02/18/2021   Edema of both lower legs 02/18/2021   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   Leukopenia 02/18/2021   Low back pain 02/18/2021   Malabsorption syndrome 28/76/8115   Metabolic syndrome 72/62/0355   Mild cervical dysplasia 02/18/2021   Mixed anxiety and depressive disorder 02/18/2021   Neuropathy 02/18/2021   Obstructive sleep apnea syndrome 02/18/2021   Senile purpura ([place]) 02/18/2021   Snoring 02/18/2021   Elevated transaminase level 02/18/2021   Type 2 diabetes mellitus with hyperglycemia ([place]) 02/18/2021   Urge incontinence of urine 02/18/2021   Vitamin B12 deficiency (non anemic) 02/18/2021   Vitamin D deficiency 02/18/2021   Pneumonia due to COVID-19 virus 10/25/2019   Hypokalemia 10/25/2019   Hyperbilirubinemia 10/25/2019   Pre-diabetes    History of total hip arthroplasty, right 09/18/2018   Osteoarthritis of right hip 09/14/2018   Morbid obesity, BMI not known ([place]) 09/17/2016   Stasis ulcer ([place]) 09/17/2016   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 ([place]) 08/29/2016   Cellulitis of left lower leg 08/10/2016   Infection of flexor tendon sheath 06/14/2016   Pain of right thumb 06/14/2016   GERD (gastroesophageal reflux disease) 02/09/2013   Essential hypertension, benign 02/09/2013   Hypercholesterolemia 02/09/2013   Unspecified constipation 02/09/2013   Muscle spasm 02/09/2013   Osteoarthritis of right knee 01/26/2013   Osteoarthritis of left hip 05/10/2012    Orientation RESPIRATION BLADDER  Height & Weight     Self, Time, Situation, Place  Normal Continent Weight: 235 lb (106.6 kg) Height:  '5\' 6"'$  (167.6 cm)  BEHAVIORAL SYMPTOMS/MOOD NEUROLOGICAL BOWEL NUTRITION STATUS       Continent Diet (See DC summary)  AMBULATORY STATUS COMMUNICATION OF NEEDS Skin   Limited Assist Verbally Surgical wounds (R leg Incision)                       Personal Care Assistance Level of Assistance  Bathing, Feeding, Dressing Bathing Assistance: Limited assistance Feeding assistance: Independent Dressing Assistance: Limited assistance     Functional Limitations Info  Sight, Hearing, Speech Sight Info: Adequate Hearing Info: Adequate Speech Info: Adequate    SPECIAL CARE FACTORS FREQUENCY  PT (By licensed PT), OT (By licensed OT)     PT Frequency: 5x week OT Frequency: 5x week            Contractures Contractures Info: Not present    Additional Factors Info  Code Status, Allergies, Insulin Sliding Scale Code Status Info: Full Allergies Info: Other   Atorvastatin   Metronidazole   Septra (Sulfamethoxazole-trimethoprim   Insulin Sliding Scale Info: Insulin Aspart (Novolog) 0-9 U every 4 hours       Current Medications (05/05/2022):  This is the current hospital active medication list Current Facility-Administered Medications  Medication Dose Route Frequency Provider Last Rate Last Admin   (feeding supplement) PROSource Plus liquid 30 mL  30 mL Oral BID BM Samtani, Jai-Gurmukh, MD   30 mL at 05/05/22 0818   0.9 %  sodium chloride infusion  250 mL Intravenous PRN Doutova, Nyoka Lint, MD       acetaminophen (TYLENOL) tablet 650 mg  650 mg Oral Q6H PRN Toy Baker, MD   650 mg at 05/05/22 0934   Or   acetaminophen (TYLENOL) suppository 650 mg  650 mg Rectal Q6H PRN Doutova, Anastassia, MD       ceFEPIme (MAXIPIME) 2 g in sodium chloride 0.9 % 100 mL IVPB  2 g Intravenous Q8H Doutova, Anastassia, MD 200 mL/hr at 05/05/22 0828 2 g at 05/05/22 0828   fentaNYL (SUBLIMAZE) injection 12.5-50 mcg  12.5-50 mcg Intravenous Q2H PRN Toy Baker, MD   50 mcg at 05/05/22 0934   gabapentin (NEURONTIN) capsule 300 mg  300 mg Oral BID Toy Baker, MD    300 mg at 05/05/22 0818   insulin aspart (novoLOG) injection 0-9 Units  0-9 Units Subcutaneous Q4H Doutova, Anastassia, MD   1 Units at 05/05/22 1148   metoprolol succinate (TOPROL-XL) 24 hr tablet 12.5 mg  12.5 mg Oral Daily Vann, Ameerah Huffstetler U, DO   12.5 mg at 05/05/22 1204   multivitamin with minerals tablet 1 tablet  1 tablet Oral Daily Nita Sells, MD   1 tablet at 05/05/22 0818   nutrition supplement (JUVEN) (JUVEN) powder packet 1 packet  1 packet Oral BID BM Nita Sells, MD   1 packet at 05/05/22 0818   pantoprazole (PROTONIX) EC tablet 40 mg  40 mg Oral Daily Doutova, Anastassia, MD   40 mg at 05/05/22 0818   rosuvastatin (CRESTOR) tablet 5 mg  5 mg Oral Daily Doutova, Anastassia, MD   5 mg at 05/05/22 0818   sodium chloride flush (NS) 0.9 % injection 3 mL  3 mL Intravenous Q12H Doutova, Anastassia, MD   3 mL at 05/05/22 0931   sodium chloride flush (NS) 0.9 % injection 3 mL  3 mL Intravenous PRN Toy Baker, MD  traMADol (ULTRAM) tablet 50 mg  50 mg Oral Q6H PRN Nita Sells, MD   50 mg at 05/04/22 2112   vancomycin (VANCOCIN) IVPB 1000 mg/200 mL premix  1,000 mg Intravenous Q24H Toy Baker, MD 200 mL/hr at 05/04/22 2236 1,000 mg at 05/04/22 2236     Discharge Medications: Please see discharge summary for a list of discharge medications.  Relevant Imaging Results:  Relevant Lab Results:   Additional Information SSN: [id number]. [place] COVID-19 Vaccine 08/22/2020 , 02/14/2020 , 01/17/2020  Coralee Pesa, [place]

## 2022-05-06 ENCOUNTER — Telehealth: Payer: Self-pay | Admitting: Orthopedic Surgery

## 2022-05-06 DIAGNOSIS — E7800 Pure hypercholesterolemia, unspecified: Secondary | ICD-10-CM | POA: Diagnosis not present

## 2022-05-06 DIAGNOSIS — K219 Gastro-esophageal reflux disease without esophagitis: Secondary | ICD-10-CM | POA: Diagnosis not present

## 2022-05-06 DIAGNOSIS — I10 Essential (primary) hypertension: Secondary | ICD-10-CM | POA: Diagnosis not present

## 2022-05-06 DIAGNOSIS — Z7401 Bed confinement status: Secondary | ICD-10-CM | POA: Diagnosis not present

## 2022-05-06 DIAGNOSIS — L03119 Cellulitis of unspecified part of limb: Secondary | ICD-10-CM | POA: Diagnosis not present

## 2022-05-06 DIAGNOSIS — E559 Vitamin D deficiency, unspecified: Secondary | ICD-10-CM | POA: Diagnosis not present

## 2022-05-06 DIAGNOSIS — E1165 Type 2 diabetes mellitus with hyperglycemia: Secondary | ICD-10-CM | POA: Diagnosis not present

## 2022-05-06 DIAGNOSIS — N189 Chronic kidney disease, unspecified: Secondary | ICD-10-CM | POA: Diagnosis not present

## 2022-05-06 DIAGNOSIS — R001 Bradycardia, unspecified: Secondary | ICD-10-CM | POA: Diagnosis not present

## 2022-05-06 DIAGNOSIS — I4581 Long QT syndrome: Secondary | ICD-10-CM | POA: Diagnosis not present

## 2022-05-06 DIAGNOSIS — A491 Streptococcal infection, unspecified site: Secondary | ICD-10-CM | POA: Diagnosis not present

## 2022-05-06 DIAGNOSIS — R946 Abnormal results of thyroid function studies: Secondary | ICD-10-CM | POA: Diagnosis not present

## 2022-05-06 DIAGNOSIS — I5032 Chronic diastolic (congestive) heart failure: Secondary | ICD-10-CM | POA: Diagnosis not present

## 2022-05-06 DIAGNOSIS — R6889 Other general symptoms and signs: Secondary | ICD-10-CM | POA: Diagnosis not present

## 2022-05-06 DIAGNOSIS — L0390 Cellulitis, unspecified: Secondary | ICD-10-CM | POA: Diagnosis not present

## 2022-05-06 DIAGNOSIS — E1140 Type 2 diabetes mellitus with diabetic neuropathy, unspecified: Secondary | ICD-10-CM | POA: Diagnosis not present

## 2022-05-06 LAB — BASIC METABOLIC PANEL
Anion gap: 10 (ref 5–15)
BUN: 14 mg/dL (ref 8–23)
CO2: 20 mmol/L — ABNORMAL LOW (ref 22–32)
Calcium: 7.9 mg/dL — ABNORMAL LOW (ref 8.9–10.3)
Chloride: 108 mmol/L (ref 98–111)
Creatinine, Ser: 0.76 mg/dL (ref 0.44–1.00)
GFR, Estimated: >60 mL/min (ref >60)
Glucose, Bld: 112 mg/dL — ABNORMAL HIGH (ref 70–99)
Potassium: 3.8 mmol/L (ref 3.5–5.1)
Sodium: 138 mmol/L (ref 135–145)

## 2022-05-06 LAB — CBC WITH DIFFERENTIAL/PLATELET
Abs Immature Granulocytes: 0.03 10*3/uL (ref 0.00–0.07)
Basophils Absolute: 0 10*3/uL (ref 0.0–0.1)
Basophils Relative: 1 %
Eosinophils Absolute: 0.1 10*3/uL (ref 0.0–0.5)
Eosinophils Relative: 5 %
HCT: 27.5 % — ABNORMAL LOW (ref 36.0–46.0)
Hemoglobin: 8.8 g/dL — ABNORMAL LOW (ref 12.0–15.0)
Immature Granulocytes: 2 %
Lymphocytes Relative: 44 %
Lymphs Abs: 0.9 10*3/uL (ref 0.7–4.0)
MCH: 32.8 pg (ref 26.0–34.0)
MCHC: 32 g/dL (ref 30.0–36.0)
MCV: 102.6 fL — ABNORMAL HIGH (ref 80.0–100.0)
Monocytes Absolute: 0.2 10*3/uL (ref 0.1–1.0)
Monocytes Relative: 8 %
Neutro Abs: 0.8 10*3/uL — ABNORMAL LOW (ref 1.7–7.7)
Neutrophils Relative %: 40 %
Platelets: 187 10*3/uL (ref 150–400)
RBC: 2.68 MIL/uL — ABNORMAL LOW (ref 3.87–5.11)
RDW: 15.6 % — ABNORMAL HIGH (ref 11.5–15.5)
WBC: 1.9 10*3/uL — ABNORMAL LOW (ref 4.0–10.5)
nRBC: 1 % — ABNORMAL HIGH (ref 0.0–0.2)

## 2022-05-06 LAB — GLUCOSE, CAPILLARY
Glucose-Capillary: 100 mg/dL — ABNORMAL HIGH (ref 70–99)
Glucose-Capillary: 109 mg/dL — ABNORMAL HIGH (ref 70–99)
Glucose-Capillary: 110 mg/dL — ABNORMAL HIGH (ref 70–99)
Glucose-Capillary: 139 mg/dL — ABNORMAL HIGH (ref 70–99)

## 2022-05-06 MED ORDER — TRAMADOL HCL 50 MG PO TABS: 50.0000 mg | ORAL_TABLET | Freq: Four times a day (QID) | ORAL | 0 refills | Status: DC | PRN

## 2022-05-06 MED ORDER — METOPROLOL SUCCINATE ER 25 MG PO TB24: 12.5000 mg | ORAL_TABLET | Freq: Every day | ORAL | Status: AC

## 2022-05-06 MED ORDER — OXYCODONE HCL 5 MG PO TABS
5.0000 mg | ORAL_TABLET | ORAL | Status: DC | PRN
  Administered 2022-05-06: 5 mg via ORAL
  Filled 2022-05-06: qty 1

## 2022-05-06 MED ORDER — OXYCODONE HCL 5 MG PO TABS: 5.0000 mg | ORAL_TABLET | ORAL | 0 refills | Status: DC | PRN

## 2022-05-06 MED ORDER — CEFADROXIL 500 MG PO CAPS
1000.0000 mg | ORAL_CAPSULE | Freq: Two times a day (BID) | ORAL | Status: DC
  Administered 2022-05-06: 1000 mg via ORAL
  Filled 2022-05-06 (×2): qty 2

## 2022-05-06 MED ORDER — CEFADROXIL 500 MG PO CAPS: 1000.0000 mg | ORAL_CAPSULE | Freq: Two times a day (BID) | ORAL | Status: DC

## 2022-05-06 NOTE — Care Management Important Message (Signed)
Important Message  Patient Details  Name: Kathleen Wiley MRN: [id number] Date of Birth: [birth year]   Medicare Important Message Given:  Yes     Orbie Pyo 05/06/2022, 2:44 PM

## 2022-05-06 NOTE — Progress Notes (Signed)
Physical Therapy Treatment Patient Details Name: Kathleen Wiley MRN: [id number] DOB: Aug 05, 1946 Today's Date: 05/06/2022   History of Present Illness Pt is a 76 y/o female presenting with swelling, wound dehiscence, and cellulitis of RLE. Pt is status post debridement for hematoma on R calf. Possible need for further I&D during this admission. Pt with recent admission 6/12-6/20 due to septic cellulitis of RLE requiring I&D, wound vac placement. PMH: DM 2, hypertension, HLD, recent streptococcal bacteremia, CHF.    PT Comments    Pt able to increase gait distance including not requiring rest break but did require verbal cues from therapist to push herself full distance. Pt to discharge to SNF for wound care today.  Recommendations for follow up therapy are one component of a multi-disciplinary discharge planning process, led by the attending physician.  Recommendations may be updated based on patient status, additional functional criteria and insurance authorization.  Follow Up Recommendations  Home health PT (if pt can have assistance from family during the day; otherwise SNF which she is requesting)     Assistance Recommended at Discharge Intermittent Supervision/Assistance  Patient can return home with the following A little help with walking and/or transfers;A little help with bathing/dressing/bathroom;Assistance with cooking/housework;Assist for transportation;Help with stairs or ramp for entrance   Equipment Recommendations       Recommendations for Other Services       Precautions / Restrictions Precautions Precautions: Fall;Other (comment) Precaution Comments: watch HR Restrictions Weight Bearing Restrictions: No     Mobility  Bed Mobility Overal bed mobility: Modified Independent Bed Mobility: Supine to Sit, Sit to Supine     Supine to sit: Modified independent (Device/Increase time) Sit to supine: Modified independent (Device/Increase time)         Transfers Overall transfer level: Needs assistance Equipment used: Rolling walker (2 wheels) Transfers: Sit to/from Stand Sit to Stand: Supervision                Ambulation/Gait Ambulation/Gait assistance: Min guard Gait Distance (Feet): 130 Feet Assistive device: Rolling walker (2 wheels) Gait Pattern/deviations: Step-to pattern, Decreased step length - left, Decreased step length - right, Decreased stance time - right, Antalgic       General Gait Details: Pt able to increase gait distance including consecutive feet traveled although decreased endurance overall (pt reported she felt like she just ran a marathon). Pt required cues to improve upon standing posture.   Stairs             Wheelchair Mobility    Modified Rankin (Stroke Patients Only)       Balance Overall balance assessment: Needs assistance Sitting-balance support: No upper extremity supported, Feet supported Sitting balance-Leahy Scale: Good     Standing balance support: During functional activity, Single extremity supported, Bilateral upper extremity supported Standing balance-Leahy Scale: Fair Standing balance comment: able to stand without UE support for short time periods                            Cognition Arousal/Alertness: Awake/alert Behavior During Therapy: WFL for tasks assessed/performed Overall Cognitive Status: Within Functional Limits for tasks assessed                                          Exercises General Exercises - Lower Extremity Hip ABduction/ADduction: Other (comment) (verbalized and demonstrated understanding) Straight Leg Raises:  Other (comment) (verbalized and demonstrated understanding)    General Comments General comments (skin integrity, edema, etc.): VSS on RA      Pertinent Vitals/Pain Pain Assessment Pain Assessment: No/denies pain Pain Location: R LE in dependent position or with WB Pain Descriptors / Indicators:  Throbbing, Discomfort, Sore, Grimacing    Home Living                          Prior Function            PT Goals (current goals can now be found in the care plan section) Acute Rehab PT Goals Patient Stated Goal: Go to rehab PT Goal Formulation: With patient Time For Goal Achievement: 05/18/22 Potential to Achieve Goals: Good Progress towards PT goals: Progressing toward goals    Frequency    Min 3X/week      PT Plan Current plan remains appropriate    Co-evaluation              AM-PAC PT "6 Clicks" Mobility   Outcome Measure  Help needed turning from your back to your side while in a flat bed without using bedrails?: None Help needed moving from lying on your back to sitting on the side of a flat bed without using bedrails?: None Help needed moving to and from a bed to a chair (including a wheelchair)?: A Little Help needed standing up from a chair using your arms (e.g., wheelchair or bedside chair)?: A Little Help needed to walk in hospital room?: A Little Help needed climbing 3-5 steps with a railing? : A Little 6 Click Score: 20    End of Session   Activity Tolerance: Patient tolerated treatment well;Patient limited by fatigue Patient left: with call bell/phone within reach;in bed Nurse Communication: Mobility status PT Visit Diagnosis: Other abnormalities of gait and mobility (R26.89);Pain;Muscle weakness (generalized) (M62.81) Pain - Right/Left: Right Pain - part of body: Leg     Time: 2956-2130 PT Time Calculation (min) (ACUTE ONLY): 15 min  Charges:  $Gait Training: 8-22 mins                     Donna Bernard, PT    [facility] 05/06/2022, 1:23 PM

## 2022-05-06 NOTE — Plan of Care (Signed)
  Problem: Education: Goal: Ability to describe self-care measures that may prevent or decrease complications (Diabetes Survival Skills Education) will improve Outcome: Progressing   Problem: Coping: Goal: Ability to adjust to condition or change in health will improve Outcome: Progressing   Problem: Metabolic: Goal: Ability to maintain appropriate glucose levels will improve Outcome: Progressing   Problem: Nutritional: Goal: Maintenance of adequate nutrition will improve Outcome: Progressing

## 2022-05-06 NOTE — Progress Notes (Signed)
Occupational Therapy Treatment Patient Details Name: Kathleen Wiley MRN: [id number] DOB: [birth year] Today's Date: 05/06/2022   History of present illness Pt is a 76 y/o female presenting with swelling, wound dehiscence, and cellulitis of RLE. Pt is status post debridement for hematoma on R calf. Possible need for further I&D during this admission. Pt with recent admission 6/12-6/20 due to septic cellulitis of RLE requiring I&D, wound vac placement. PMH: DM 2, hypertension, HLD, recent streptococcal bacteremia, CHF.   OT comments  Pt assisted to and from bathroom for toileting and standing grooming with supervision and RW. Pt to discharge to SNF this afternoon for wound care. Pt encouraged to continue to mobilize and participate maximally in ADLs at SNF, verbalized understanding.    Recommendations for follow up therapy are one component of a multi-disciplinary discharge planning process, led by the attending physician.  Recommendations may be updated based on patient status, additional functional criteria and insurance authorization.    Follow Up Recommendations  Follow physician's recommendations for discharge plan and follow up therapies    Assistance Recommended at Discharge Intermittent Supervision/Assistance  Patient can return home with the following  Assistance with cooking/housework;Assist for transportation;Help with stairs or ramp for entrance;A little help with walking and/or transfers;A little help with bathing/dressing/bathroom   Equipment Recommendations  None recommended by OT    Recommendations for Other Services      Precautions / Restrictions Precautions Precautions: Fall Precaution Comments: watch HR Restrictions Weight Bearing Restrictions: No       Mobility Bed Mobility Overal bed mobility: Needs Assistance Bed Mobility: Supine to Sit, Sit to Supine     Supine to sit: Supervision, HOB elevated Sit to supine: Supervision        Transfers Overall  transfer level: Needs assistance Equipment used: Rolling walker (2 wheels) Transfers: Sit to/from Stand Sit to Stand: Supervision                 Balance Overall balance assessment: Needs assistance Sitting-balance support: No upper extremity supported, Feet supported Sitting balance-Leahy Scale: Good       Standing balance-Leahy Scale: Fair Standing balance comment: able to stand without UE support at sink                           ADL either performed or assessed with clinical judgement   ADL Overall ADL's : Needs assistance/impaired     Grooming: Wash/dry hands;Supervision/safety;Standing                   Toilet Transfer: Supervision/safety;Rolling walker (2 wheels);Ambulation   Toileting- Clothing Manipulation and Hygiene: Supervision/safety;Sit to/from stand       Functional mobility during ADLs: Supervision/safety;Rolling walker (2 wheels)      Extremity/Trunk Assessment              Vision       Perception     Praxis      Cognition Arousal/Alertness: Awake/alert Behavior During Therapy: WFL for tasks assessed/performed Overall Cognitive Status: Within Functional Limits for tasks assessed                                 General Comments: pt verbalizing awareness of need to continue mobilizing and participating maximally in ADLs at SNF to prepare her for return home        Exercises      Shoulder Instructions  General Comments      Pertinent Vitals/ Pain       Pain Assessment Pain Assessment: No/denies pain  Home Living                                          Prior Functioning/Environment              Frequency  Min 2X/week        Progress Toward Goals  OT Goals(current goals can now be found in the care plan section)  Progress towards OT goals: Progressing toward goals  Acute Rehab OT Goals OT Goal Formulation: With patient Time For Goal Achievement:  05/18/22 Potential to Achieve Goals: Good  Plan Discharge plan remains appropriate    Co-evaluation                 AM-PAC OT "6 Clicks" Daily Activity     Outcome Measure   Help from another person eating meals?: None Help from another person taking care of personal grooming?: A Little Help from another person toileting, which includes using toliet, bedpan, or urinal?: A Little Help from another person bathing (including washing, rinsing, drying)?: A Little Help from another person to put on and taking off regular upper body clothing?: None Help from another person to put on and taking off regular lower body clothing?: A Little 6 Click Score: 20    End of Session Equipment Utilized During Treatment: Rolling walker (2 wheels);Gait belt  OT Visit Diagnosis: Unsteadiness on feet (R26.81);Other abnormalities of gait and mobility (R26.89)   Activity Tolerance Patient tolerated treatment well   Patient Left in bed;with call bell/phone within reach;with bed alarm set;with family/visitor present   Nurse Communication          Time: 4967-5916 OT Time Calculation (min): 14 min  Charges: OT General Charges $OT Visit: 1 Visit OT Treatments $Self Care/Home Management : 8-22 mins  Cleta Alberts, OTR/L Acute Rehabilitation Services Office: [phone]   Malka So 05/06/2022, 12:40 PM

## 2022-05-06 NOTE — Plan of Care (Signed)

## 2022-05-06 NOTE — TOC Transition Note (Signed)
Transition of Care [facility]) - CM/SW Discharge Note   Patient Details  Name: CORITA ALLINSON MRN: [id number] Date of Birth: 12/08/1945  Transition of Care [facility]) CM/SW Contact:  Coralee Pesa, [place] Phone Number: 05/06/2022, 12:06 PM   Clinical Narrative:    Pt to be transported to [facility]. Nurse to call report to [phone].   Final next level of care: Skilled Nursing Facility Barriers to Discharge: Barriers Resolved   Patient Goals and CMS Choice Patient states their goals for this hospitalization and ongoing recovery are:: Pt would like to recieve further care. CMS Medicare.gov Compare Post Acute Care list provided to:: Patient Choice offered to / list presented to : Patient  Discharge Placement              Patient chooses bed at:  [facility]) Patient to be transferred to facility by: [place] Name of family member notified: Patient Patient and family notified of of transfer: 05/06/22  Discharge Plan and Services     Post Acute Care Choice: [place]                               Social Determinants of Health (SDOH) Interventions     Readmission Risk Interventions     No data to display

## 2022-05-06 NOTE — Discharge Summary (Signed)
Physician Discharge Summary  Kathleen Wiley LZJ:[id number] DOB: May 26, 1946 DOA: 05/03/2022  PCP: Jonathon Jordan, MD  Admit date: 05/03/2022 Discharge date: 05/06/2022  Admitted From: home Discharge disposition: SNF   Recommendations for Outpatient Follow-Up:   Wound care: Wound care to Right LE full thickness wounds:  Cleanse wounds with NS, gently pat dry. Cover with ABD pads, secure with Kerlix roll gauze applied form just below toes to just below knee. Top Kerlix with ACE wrap applied from just below toes to just below knee. Change twice daily. Float heel while in bed. 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  Diet recommendation: Low sodium, heart healthy.  Carbohydrate-modified  Wound care: None.  Code status: Full.   History of Present Illness:   Kathleen Wiley is a 76 y.o. female with medical history significant of DM 2, hypertension, HLD, recent streptococcal bacteremia, CHF     Presented with   right  leg pain and bleeding  Was here admitted 6 days for cellulitis of right lower extremity possible neck fracture requiring 4 fasciotomies went home with wound VAC Cultures grew group G Streptococcus  Started to bleed through the bandage Wound vac was just taken off  today while was seen by Dr. Sharol Given in the office Was sent back to emergency department due to persistent erythema At discharge she was taking her amoxicillin as prescribed Otherwise no fever no nausea no vomiting she still has significant leg pain   During prior admission echogram done showed no endocarditis initially treated with IV penicillin and Zyvox was discharged home on oral Augmentin per ID recommendation and wound VAC patient felt that she would like to go home rather than the SNF    Pt was cared by her daughter in law who lives 1.5 h away and she was unable to get home nursing to come out there   No fever, no CP no SOB Pt refuses any blood products She does not smoke nor drink   She reports she has run out of her toprol few weeks ago     Hospital Course by Problem:   Cellulitis in the setting of recent large surgery debridement hematoma right calf Prior culture grew Streptococcus -de-escalate to PO abx Pain control first choice Tylenol second choice oxy -appreciate Dr. Jess Barters consult -elevate extremity   Leukopenia/anemia with macrocytosis This is not new but has been relatively recent Leukopenia seems to have been present since  last hospitalization and was thought to be secondary to Zyvox -outpatient follow up with CBC/diff within 1 week and referral to hematology if needed   HTN/ chronic Systolic as well as diastolic heart failure PTA metoprolol XL 25 daily- resume lower dose due to bradycardia   DM TY 2 with neuropathy Usually uses Ozempic 2 mg once  a week which has been held Continue sliding scale coverage Can continue gabapentin 300 twice daily and if needed escalate back to 3 times daily on discharge    Medical Consultants:    ortho  Discharge Exam:   Vitals:   05/05/22 2003 05/06/22 0518  BP: (!) 118/50 (!) 126/49  Pulse: 76 69  Resp: 20 16  Temp: 98.2 F (36.8 C) 97.8 F (36.6 C)  SpO2: 98% 98%   Vitals:   05/05/22 0744 05/05/22 1522  05/05/22 2003 05/06/22 0518  BP: (!) 126/54 (!) 120/47 (!) 118/50 (!) 126/49  Pulse: 69 71 76 69  Resp:   20 16  Temp: 98.4 F (36.9 C) 98.4 F (36.9 C) 98.2 F (36.8 C) 97.8 F (36.6 C)  TempSrc: Oral Oral Oral Oral  SpO2: 96% 97% 98% 98%  Weight:      Height:        General exam: Appears calm and comfortable.  .    The results of significant diagnostics from this hospitalization (including imaging, microbiology, ancillary and laboratory) are listed below for reference.     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 There is no evidence of fracture or other focal bone lesions. Generalized soft tissue edema. Scattered soft tissue calcifications. No soft tissue gas. IMPRESSION: 1. Generalized soft tissue edema. No soft tissue gas. 2. No acute osseous abnormality of the right tibia-fibula. Electronically Signed   By: Davina Poke D.O.   On: 05/03/2022 19:06     Labs:   Basic Metabolic Panel: Recent Labs  Lab 05/03/22 1831 05/03/22 2250 05/04/22 0324 05/05/22 0301 05/06/22 0343  NA 135  --  138 138 138  K 3.9  --  4.2 3.9 3.8  CL 102  --  105 109 108  CO2 23  --  22 21* 20*  GLUCOSE 146*  --  101* 113* 112*  BUN 12  --  '13 14 14  '$ CREATININE 0.96  --  0.83 0.76 0.76  CALCIUM 8.2*  --  8.0* 8.0* 7.9*  MG  --  2.2  --   --   --   PHOS  --  4.4  --   --   --    GFR Estimated Creatinine Clearance: 76.2 mL/min (by C-G formula based on SCr of 0.76 mg/dL). Liver Function Tests: Recent Labs  Lab 05/03/22 1831 05/04/22 0324  AST 21 19  ALT 13 11  ALKPHOS 87 68  BILITOT 1.0 1.2  PROT 6.5 5.3*  ALBUMIN 2.4* 2.1*   No results for input(s): "LIPASE", "AMYLASE" in the last 168 hours. No results for input(s): "AMMONIA" in the last 168  hours. Coagulation profile No results for input(s): "INR", "PROTIME" in the last 168 hours.  CBC: Recent Labs  Lab 05/03/22 1831 05/04/22 0324 05/05/22 0301 05/06/22 0343  WBC 3.1* 2.2* 2.4* 1.9*  NEUTROABS 1.5*  --  1.0* 0.8*  HGB 9.0* 8.4* 8.4* 8.8*  HCT 28.9* 26.9* 27.1* 27.5*  MCV 103.6* 103.9* 104.2* 102.6*  PLT 238 212 210 187   Cardiac Enzymes: Recent Labs  Lab 05/03/22 2250  CKTOTAL 46   BNP: Invalid input(s): "POCBNP" CBG: Recent Labs  Lab 05/05/22 1636 05/05/22 2000 05/06/22 0005 05/06/22 0400 05/06/22 0739  GLUCAP 114* 112* 139* 109* 100*   D-Dimer No results  for input(s): "DDIMER" in the last 72 hours. Hgb A1c No results for input(s): "HGBA1C" in the last 72 hours. Lipid Profile No results for input(s): "CHOL", "HDL", "LDLCALC", "TRIG", "CHOLHDL", "LDLDIRECT" in the last 72 hours. Thyroid function studies Recent Labs    05/03/22 2250  TSH 5.705*   Anemia work up Recent Labs    05/03/22 2240 05/03/22 2250  VITAMINB12  --  1,495*  FOLATE  --  11.5  FERRITIN  --  247  TIBC  --  295  IRON  --  51  RETICCTPCT 5.3*  --    Microbiology Recent Results (from the past 240 hour(s))  Culture, blood (routine x 2)     Status: None (Preliminary result)   Collection Time: 05/03/22  6:31 PM   Specimen: BLOOD  Result Value Ref Range Status   Specimen Description BLOOD RIGHT ANTECUBITAL  Final   Special Requests   Final    BOTTLES DRAWN AEROBIC AND ANAEROBIC Blood Culture adequate volume   Culture   Final    NO GROWTH 3 DAYS Performed at [facility], [address] [place], [place] [address]    Report Status PENDING  Incomplete  Culture, blood (routine x 2)     Status: None (Preliminary result)   Collection Time: 05/03/22 10:50 PM   Specimen: BLOOD  Result Value Ref Range Status   Specimen Description BLOOD SITE NOT SPECIFIED  Final   Special Requests   Final    BOTTLES DRAWN AEROBIC AND ANAEROBIC Blood Culture results may not be  optimal due to an excessive volume of blood received in culture bottles   Culture   Final    NO GROWTH 3 DAYS Performed at [facility], [place] [address] [place], [place] [address]    Report Status PENDING  Incomplete     Discharge Instructions:   Discharge Instructions     Diet Carb Modified   Complete by: As directed    Discharge wound care:   Complete by: As directed    Wound care to Right LE full thickness wounds:  Cleanse wounds with NS, gently pat dry. Cover with ABD pads, secure with Kerlix roll gauze applied form just below toes to just below knee. Top Kerlix with ACE wrap applied from just below toes to just below knee. Change twice daily. Float heel while in bed.   Increase activity slowly   Complete by: As directed       Allergies as of 05/06/2022       Reactions   Other    Refuses whole blood but does accept albumin   Atorvastatin Other (See Comments)   Metronidazole Other (See Comments)   Chest pain, A-fib   Septra [sulfamethoxazole-trimethoprim] Other (See Comments)   Blisters        Medication List     STOP taking these medications    amoxicillin 500 MG capsule Commonly known as: AMOXIL       TAKE these medications    acetaminophen 325 MG tablet Commonly known as: TYLENOL Take 2 tablets (650 mg total) by mouth every 6 (six) hours as needed. What changed: reasons to take this   cefadroxil 500 MG capsule Commonly known as: DURICEF Take 2 capsules (1,000 mg total) by mouth 2 (two) times daily.   cyanocobalamin 1000 MCG tablet Take 1,000 mcg by mouth daily.   escitalopram 20 MG tablet Commonly known as: LEXAPRO Take 20 mg by mouth daily.   fluticasone 50 MCG/ACT nasal spray  Commonly known as: FLONASE Place 2 sprays into both nostrils daily as needed for allergies.   furosemide 20 MG tablet Commonly known as: LASIX Take 20 mg by mouth daily.   gabapentin 300 MG capsule Commonly known as: NEURONTIN Take 300 mg by mouth 3 (three)  times daily.   Insulin Pen Needle 32G X 6 MM Misc Inject into the skin daily.   metoprolol succinate 25 MG 24 hr tablet Commonly known as: Toprol XL Take 0.5 tablets (12.5 mg total) by mouth daily. What changed: how much to take   omeprazole 20 MG capsule Commonly known as: PRILOSEC Take 20 mg by mouth 2 (two) times daily before a meal.   oxyCODONE 5 MG immediate release tablet Commonly known as: Oxy IR/ROXICODONE Take 1 tablet (5 mg total) by mouth every 4 (four) hours as needed for severe pain or moderate pain. What changed:  when to take this reasons to take this   Ozempic (1 MG/DOSE) 4 MG/3ML Sopn Generic drug: Semaglutide (1 MG/DOSE) Inject 2 mg into the skin once a week.   polyethylene glycol powder 17 GM/SCOOP powder Commonly known as: GLYCOLAX/MIRALAX Take 17 g by mouth daily as needed for moderate constipation.   potassium chloride 10 MEQ tablet Commonly known as: KLOR-CON Take 10 mEq by mouth daily.   rosuvastatin 5 MG tablet Commonly known as: CRESTOR Take 5 mg by mouth at bedtime.   Vitamin D 50 MCG (2000 UT) tablet Take 2,000 Units by mouth daily.               Discharge Care Instructions  (From admission, onward)           Start     Ordered   05/06/22 0000  Discharge wound care:       Comments: Wound care to Right LE full thickness wounds:  Cleanse wounds with NS, gently pat dry. Cover with ABD pads, secure with Kerlix roll gauze applied form just below toes to just below knee. Top Kerlix with ACE wrap applied from just below toes to just below knee. Change twice daily. Float heel while in bed.   05/06/22 0953              Time coordinating discharge: 35 min  Signed:  Geradine Girt DO  [facility] 05/06/2022, 10:04 AM

## 2022-05-06 NOTE — Telephone Encounter (Signed)
Patient called. She is at [facility] and requesting to go home with home care or a different facility. Her call back number is [phone]

## 2022-05-07 NOTE — Telephone Encounter (Signed)
I called and sw pt and she states that she is working on Biochemist, clinical to change SNF. She said that she does not want to stay where she is and would like to move. Pt states she does not need anything at this moment that she was panicked yesterday but does have aplan in motion through the help of family and friends to possibly move facilities.

## 2022-05-08 LAB — CULTURE, BLOOD (ROUTINE X 2)
Culture: NO GROWTH
Culture: NO GROWTH
Special Requests: ADEQUATE

## 2022-05-10 DIAGNOSIS — L02419 Cutaneous abscess of limb, unspecified: Secondary | ICD-10-CM | POA: Diagnosis not present

## 2022-05-10 DIAGNOSIS — I890 Lymphedema, not elsewhere classified: Secondary | ICD-10-CM | POA: Diagnosis not present

## 2022-05-10 DIAGNOSIS — D61818 Other pancytopenia: Secondary | ICD-10-CM | POA: Diagnosis not present

## 2022-05-13 ENCOUNTER — Ambulatory Visit (INDEPENDENT_AMBULATORY_CARE_PROVIDER_SITE_OTHER): Payer: Medicare Other | Admitting: Orthopedic Surgery

## 2022-05-13 DIAGNOSIS — L97915 Non-pressure chronic ulcer of unspecified part of right lower leg with muscle involvement without evidence of necrosis: Secondary | ICD-10-CM

## 2022-05-14 ENCOUNTER — Telehealth: Payer: Self-pay | Admitting: Hematology and Oncology

## 2022-05-14 NOTE — Telephone Encounter (Signed)
Scheduled appt per 7/6 referral. Pt is aware of appt date and time. Pt is aware to arrive 15 mins prior to appt time and to bring and updated insurance card. Pt is aware of appt location.   

## 2022-05-16 DIAGNOSIS — D62 Acute posthemorrhagic anemia: Secondary | ICD-10-CM | POA: Diagnosis not present

## 2022-05-16 DIAGNOSIS — I4891 Unspecified atrial fibrillation: Secondary | ICD-10-CM | POA: Diagnosis not present

## 2022-05-16 DIAGNOSIS — E538 Deficiency of other specified B group vitamins: Secondary | ICD-10-CM | POA: Diagnosis not present

## 2022-05-16 DIAGNOSIS — L989 Disorder of the skin and subcutaneous tissue, unspecified: Secondary | ICD-10-CM | POA: Diagnosis not present

## 2022-05-16 DIAGNOSIS — I119 Hypertensive heart disease without heart failure: Secondary | ICD-10-CM | POA: Diagnosis not present

## 2022-05-16 DIAGNOSIS — I4510 Unspecified right bundle-branch block: Secondary | ICD-10-CM | POA: Diagnosis not present

## 2022-05-16 DIAGNOSIS — D61818 Other pancytopenia: Secondary | ICD-10-CM | POA: Diagnosis not present

## 2022-05-16 DIAGNOSIS — E46 Unspecified protein-calorie malnutrition: Secondary | ICD-10-CM | POA: Diagnosis not present

## 2022-05-16 DIAGNOSIS — M1990 Unspecified osteoarthritis, unspecified site: Secondary | ICD-10-CM | POA: Diagnosis not present

## 2022-05-16 DIAGNOSIS — I890 Lymphedema, not elsewhere classified: Secondary | ICD-10-CM | POA: Diagnosis not present

## 2022-05-16 DIAGNOSIS — L03115 Cellulitis of right lower limb: Secondary | ICD-10-CM | POA: Diagnosis not present

## 2022-05-16 DIAGNOSIS — I272 Pulmonary hypertension, unspecified: Secondary | ICD-10-CM | POA: Diagnosis not present

## 2022-05-16 DIAGNOSIS — E781 Pure hyperglyceridemia: Secondary | ICD-10-CM | POA: Diagnosis not present

## 2022-05-16 DIAGNOSIS — M519 Unspecified thoracic, thoracolumbar and lumbosacral intervertebral disc disorder: Secondary | ICD-10-CM | POA: Diagnosis not present

## 2022-05-16 DIAGNOSIS — E1122 Type 2 diabetes mellitus with diabetic chronic kidney disease: Secondary | ICD-10-CM | POA: Diagnosis not present

## 2022-05-16 DIAGNOSIS — E785 Hyperlipidemia, unspecified: Secondary | ICD-10-CM | POA: Diagnosis not present

## 2022-05-16 DIAGNOSIS — M5416 Radiculopathy, lumbar region: Secondary | ICD-10-CM | POA: Diagnosis not present

## 2022-05-16 DIAGNOSIS — E1140 Type 2 diabetes mellitus with diabetic neuropathy, unspecified: Secondary | ICD-10-CM | POA: Diagnosis not present

## 2022-05-16 DIAGNOSIS — I700 Atherosclerosis of aorta: Secondary | ICD-10-CM | POA: Diagnosis not present

## 2022-05-16 DIAGNOSIS — K219 Gastro-esophageal reflux disease without esophagitis: Secondary | ICD-10-CM | POA: Diagnosis not present

## 2022-05-16 DIAGNOSIS — N189 Chronic kidney disease, unspecified: Secondary | ICD-10-CM | POA: Diagnosis not present

## 2022-05-16 DIAGNOSIS — B356 Tinea cruris: Secondary | ICD-10-CM | POA: Diagnosis not present

## 2022-05-19 ENCOUNTER — Ambulatory Visit: Payer: Medicare Other | Admitting: Nurse Practitioner

## 2022-05-19 ENCOUNTER — Encounter: Payer: Self-pay | Admitting: Nurse Practitioner

## 2022-05-19 VITALS — BP 124/60 | HR 77 | Ht 65.0 in | Wt 222.6 lb

## 2022-05-19 DIAGNOSIS — I77810 Thoracic aortic ectasia: Secondary | ICD-10-CM | POA: Diagnosis not present

## 2022-05-19 DIAGNOSIS — G4733 Obstructive sleep apnea (adult) (pediatric): Secondary | ICD-10-CM

## 2022-05-19 DIAGNOSIS — R001 Bradycardia, unspecified: Secondary | ICD-10-CM

## 2022-05-19 DIAGNOSIS — I10 Essential (primary) hypertension: Secondary | ICD-10-CM | POA: Diagnosis not present

## 2022-05-19 DIAGNOSIS — E785 Hyperlipidemia, unspecified: Secondary | ICD-10-CM

## 2022-05-19 DIAGNOSIS — R002 Palpitations: Secondary | ICD-10-CM

## 2022-05-19 DIAGNOSIS — R072 Precordial pain: Secondary | ICD-10-CM | POA: Diagnosis not present

## 2022-05-19 NOTE — Progress Notes (Signed)
Office Visit    Patient Name: Kathleen Wiley Date of Encounter: 05/19/2022  Primary Care Provider:  Jonathon Jordan, MD Primary Cardiologist:  Kirk Ruths, MD  Chief Complaint    76 year old female with a history of chest pain, palpitations, chronic diastolic heart failure, aortic root dilation, remote history of atrial fibrillation, hypertension, hyperlipidemia, OSA, type 2 diabetes, GERD, and left leg cellulitis who presents for hospital follow-up related to hypertension and bradycardia.  Past Medical History    Past Medical History:  Diagnosis Date   Anxiety    Arthritis    knee, hip   Cellulitis 08/2016   left leg   Complication of anesthesia    Diabetes mellitus without complication (HCC)    GERD (gastroesophageal reflux disease)    Heart murmur    "slight "  informed years ago.-    Hernia, umbilical    History of atrial fibrillation 2007   with flagyl   History of endometriosis    Hypercholesteremia    Hypertension    has not been to a cardiologist   PONV (postoperative nausea and vomiting)    Rectal vaginal fistula    Refusal of blood transfusions as patient is Jehovah's Witness    Sleep apnea    Past Surgical History:  Procedure Laterality Date   APPENDECTOMY     BUNIONECTOMY Right 01/09/2019   Procedure: RIGHT FOOT BUNION CORRECTION;  Surgeon: Erle Crocker, MD;  Location: [place];  Service: Orthopedics;  Laterality: Right;   CHOLECYSTECTOMY N/A 03/16/2021   Procedure: LAPAROSCOPIC CHOLECYSTECTOMY;  Surgeon: Stark Klein, MD;  [facility];  Service: General;  Laterality: N/A;   ENDOSCOPIC RETROGRADE CHOLANGIOPANCREATOGRAPHY (ERCP) WITH PROPOFOL N/A 03/15/2021   Procedure: ENDOSCOPIC RETROGRADE CHOLANGIOPANCREATOGRAPHY (ERCP) WITH PROPOFOL;  Surgeon: Ronnette Juniper, MD;  [facility];  Service: Gastroenterology;  Laterality: N/A;   EXPLORATORY LAPAROTOMY  1973   for endometrosis   HAMMERTOE RECONSTRUCTION WITH WEIL OSTEOTOMY Right 01/09/2019    Procedure: RIGHT FOOT 2-4 HAMMERTOE CORRECTION WITH WEIL OSTEOTOMY 2ND METATARSAL, DEEP TENDON TRANSFER WITH AKIN OSTEOTOMY;  Surgeon: Erle Crocker, MD;  Location: [place];  Service: Orthopedics;  Laterality: Right;   I & D EXTREMITY Right 06/10/2016   Procedure: IRRIGATION AND DEBRIDEMENT THUMB FLEXOR SHEATH;  Surgeon: Leanora Cover, MD;  Location: [place];  Service: Orthopedics;  Laterality: Right;   I & D EXTREMITY Right 04/23/2022   Procedure: RIGHT LEG DEBRIDEMENT;  Surgeon: Newt Minion, MD;  Location: [place];  Service: Orthopedics;  Laterality: Right;   JOINT REPLACEMENT     Left Hip   MANDIBLE SURGERY  1978   rectovaginal fistula repair  1980   REMOVAL OF STONES  03/15/2021   Procedure: REMOVAL OF STONES;  Surgeon: Ronnette Juniper, MD;  [facility];  Service: Gastroenterology;;   Joan Mayans  03/15/2021   Procedure: Joan Mayans;  Surgeon: Ronnette Juniper, MD;  Location[facility];  Service: Gastroenterology;;   TOTAL HIP ARTHROPLASTY  05/08/2012   Procedure: TOTAL HIP ARTHROPLASTY;  Surgeon: Kerin Salen, MD;  Location: [place];  Service: Orthopedics;  Laterality: Left;   TOTAL HIP ARTHROPLASTY Right 09/18/2018   Procedure: TOTAL HIP ARTHROPLASTY ANTERIOR APPROACH;  Surgeon: Frederik Pear, MD;  [facility];  Service: Orthopedics;  Laterality: Right;   TOTAL KNEE ARTHROPLASTY Right 01/24/2013   Dr Mayer Camel   TOTAL KNEE ARTHROPLASTY Right 01/24/2013   Procedure: RIGHT TOTAL KNEE ARTHROPLASTY;  Surgeon: Kerin Salen, MD;  Location: [place];  Service: Orthopedics;  Laterality:  Right;    Allergies  Allergies  Allergen Reactions   Other     Refuses whole blood but does accept albumin   Atorvastatin Other (See Comments)   Metronidazole Other (See Comments)    Chest pain, A-fib    Septra [Sulfamethoxazole-Trimethoprim] Other (See Comments)    Blisters     History of Present Illness    76 year old female with the above past medical history including chest pain,  palpitations, chronic diastolic heart failure, aortic root dilation, remote history of atrial fibrillation, hypertension, hyperlipidemia, OSA, type 2 diabetes, GERD, and left leg cellulitis.  She was initially referred to Dr. Stanford Breed in January 2023 by her PCP in the setting of chest pain and dyspnea.  Echocardiogram in October 2020 showed EF 65 to 70%, mild LV VH, mild TR, mild dilation of aortic root measuring 42 mm.  She was evaluated in the ED in January 2023 with complaints of chest pain and palpitations.  Chest x-ray showed cardiomegaly with vascular congestion.  Labs were unremarkable. She described her symptoms as substernal chest pressure radiating to her jaws associated dyspnea. She was last seen in the office on 12/08/2021 and was stable from a cardiac standpoint.  Coronary CTA was ordered but not completed due to cost concerns.  Subsequent Lexiscan Myoview in February 2023 showed no evidence of ischemia.  Monitor in February 2023 in the setting of palpitations revealed predominant underlying rhythm of sinus rhythm, first-degree AV block, 65 runs of SVT isolated SVE's and VE's, ventricular bigeminy.  Carvedilol was switched to Toprol 25 mg daily in the setting of GI upset.  She was hospitalized in June 2023 in the setting of cellulitis in the setting of recent surgical debridement of hematoma of right calf.  Repeat echocardiogram showed EF 60 to 65%, no RWMA, indeterminate diastolic parameters, no significant valvular abnormalities, dilation of aortic root, measuring 42 mm.  She presents today for follow-up accompanied by her step-son.  Since her most recent visit and since her hospitalization she has been stable from a cardiac standpoint.  She denies any dyspnea, palpitations, denies symptoms concerning for angina. She states she has been taking Toprol 25 mg daily, however, it appears this was decreased to 12.5 mg daily on her recent hospitalization in the setting of recent bradycardia. She has been  scheduled to follow-up with hematology in the setting of leukopenia/anemia with microcytosis. Overall, she reports feeling well and denies any new concerns today.  Home Medications    Current Outpatient Medications  Medication Sig Dispense Refill   acetaminophen (TYLENOL) 325 MG tablet Take 2 tablets (650 mg total) by mouth every 6 (six) hours as needed. (Patient taking differently: Take 650 mg by mouth every 6 (six) hours as needed for mild pain.)     cefadroxil (DURICEF) 500 MG capsule Take 2 capsules (1,000 mg total) by mouth 2 (two) times daily.     Cholecalciferol (VITAMIN D) 50 MCG (2000 UT) tablet Take 2,000 Units by mouth daily.      cyanocobalamin 1000 MCG tablet Take 1,000 mcg by mouth daily.      escitalopram (LEXAPRO) 20 MG tablet Take 20 mg by mouth daily.     fluticasone (FLONASE) 50 MCG/ACT nasal spray Place 2 sprays into both nostrils daily as needed for allergies.      furosemide (LASIX) 20 MG tablet Take 20 mg by mouth daily.     gabapentin (NEURONTIN) 300 MG capsule Take 300 mg by mouth 3 (three) times daily.     metoprolol  succinate (TOPROL XL) 25 MG 24 hr tablet Take 0.5 tablets (12.5 mg total) by mouth daily.     omeprazole (PRILOSEC) 20 MG capsule Take 20 mg by mouth 2 (two) times daily before a meal.     oxyCODONE (OXY IR/ROXICODONE) 5 MG immediate release tablet Take 1 tablet (5 mg total) by mouth every 4 (four) hours as needed for severe pain or moderate pain. 5 tablet 0   OZEMPIC, 1 MG/DOSE, 4 MG/3ML SOPN Inject 2 mg into the skin once a week.     polyethylene glycol powder (GLYCOLAX/MIRALAX) 17 GM/SCOOP powder Take 17 g by mouth daily as needed for moderate constipation. 238 g 0   potassium chloride (KLOR-CON) 10 MEQ tablet Take 10 mEq by mouth daily.     rosuvastatin (CRESTOR) 5 MG tablet Take 5 mg by mouth at bedtime.     Insulin Pen Needle 32G X 6 MM MISC Inject into the skin daily. (Patient not taking: Reported on 05/19/2022)     No current facility-administered  medications for this visit.     Review of Systems    He denies chest pain, palpitations, dyspnea, pnd, orthopnea, n, v, dizziness, syncope, edema, weight gain, or early satiety. All other systems reviewed and are otherwise negative except as noted above.   Physical Exam    VS:  BP 124/60   Pulse 77   Ht '5\' 5"'$  (1.651 m)   Wt 222 lb 9.6 oz (101 kg)   SpO2 96%   BMI 37.04 kg/m   GEN: Well nourished, well developed, in no acute distress. HEENT: normal. Neck: Supple, no JVD, carotid bruits, or masses. Cardiac: RRR, no murmurs, rubs, or gallops. No clubbing, cyanosis, edema.  Radials/DP/PT 2+ and equal bilaterally.  Respiratory:  Respirations regular and unlabored, clear to auscultation bilaterally. GI: Soft, nontender, nondistended, BS + x 4. MS: no deformity or atrophy. Skin: warm and dry, no rash. Neuro:  Strength and sensation are intact. Psych: Normal affect.  Accessory Clinical Findings    ECG personally reviewed by me today -no EKG in office today.  Lab Results  Component Value Date   WBC 1.9 (L) 05/06/2022   HGB 8.8 (L) 05/06/2022   HCT 27.5 (L) 05/06/2022   MCV 102.6 (H) 05/06/2022   PLT 187 05/06/2022   Lab Results  Component Value Date   CREATININE 0.76 05/06/2022   BUN 14 05/06/2022   NA 138 05/06/2022   K 3.8 05/06/2022   CL 108 05/06/2022   CO2 20 (L) 05/06/2022   Lab Results  Component Value Date   ALT 11 05/04/2022   AST 19 05/04/2022   ALKPHOS 68 05/04/2022   BILITOT 1.2 05/04/2022   Lab Results  Component Value Date   TRIG 168 (H) 10/25/2019    Lab Results  Component Value Date   HGBA1C 5.2 04/20/2022    Assessment & Plan    1. Atypical chest pain: Lexiscan Myoview in February 2023 showed no evidence of ischemia.  Repeat echo in 04/2022 showed EF 60 to 65%, no RWMA, indeterminate diastolic parameters, no significant valvular abnormalities, dilation of aortic root, measuring 42 mm. Stable with no anginal symptoms. No indication for ischemic  evaluation.  Continue metoprolol, Crestor.  2. Palpitations/bradycardia: Monitor in February 2023 in the setting of palpitations revealed predominant underlying rhythm of sinus rhythm, first-degree AV block, 65 runs of SVT isolated SVE's and VE's, ventricular bigeminy. Carvedilol was switched to Toprol 25 mg daily in the setting of GI upset.  She states  she has been taking Toprol 25 mg daily, however, it appears this was decreased to 12.5 mg daily on her recent hospitalization in the setting of bradycardia.  Have her continue metoprolol 12.5 mg daily.  3. Dilated aortic root: Measured 42 mm on most recent echo in 04/2022. Consider CT chest/aorta in the future for ongoing monitoring.   4. Remote atrial fibrillation: Recent monitor as above.  No documented recurrence. Not on anticoagulation.  5. Hypertension: BP well controlled. Continue current antihypertensive regimen.   6. Hyperlipidemia: LDL was 46 in February 2023.  Continue Crestor.  7. OSA: Not on CPAP. No concerns today.   8. Disposition: Follow-up in 6 months.  Lenna Sciara, NP 05/19/2022, 4:33 PM

## 2022-05-19 NOTE — Patient Instructions (Addendum)
Medication Instructions:  Your physician recommends that you continue on your current medications as directed. Please refer to the Current Medication list given to you today.   *If you need a refill on your cardiac medications before your next appointment, please call your pharmacy*   Lab Work: Your physician recommends that you continue on your current medications as directed. Please refer to the Current Medication list given to you today.   If you have labs (blood work) drawn today and your tests are completely normal, you will receive your results only by: [place] (if you have MyChart) OR A paper copy in the mail If you have any lab test that is abnormal or we need to change your treatment, we will call you to review the results.   Testing/Procedures: NONE ordered at this time of appointment     Follow-Up: At [facility], you and your health needs are our priority.  As part of our continuing mission to provide you with exceptional heart care, we have created designated Provider Care Teams.  These Care Teams include your primary Cardiologist (physician) and Advanced Practice Providers (APPs -  Physician Assistants and Nurse Practitioners) who all work together to provide you with the care you need, when you need it.  We recommend signing up for the patient portal called "MyChart".  Sign up information is provided on this After Visit Summary.  MyChart is used to connect with patients for Virtual Visits (Telemedicine).  Patients are able to view lab/test results, encounter notes, upcoming appointments, etc.  Non-urgent messages can be sent to your provider as well.   To learn more about what you can do with MyChart, go to [email or web address].    Your next appointment:   6 month(s)  The format for your next appointment:   In Person  Provider:   Kirk Ruths, MD     Other Instructions     Important Information About Sugar

## 2022-05-21 ENCOUNTER — Ambulatory Visit: Payer: Medicare Other | Admitting: Adult Health

## 2022-05-21 DIAGNOSIS — I890 Lymphedema, not elsewhere classified: Secondary | ICD-10-CM | POA: Diagnosis not present

## 2022-05-21 DIAGNOSIS — B356 Tinea cruris: Secondary | ICD-10-CM | POA: Diagnosis not present

## 2022-05-21 DIAGNOSIS — M519 Unspecified thoracic, thoracolumbar and lumbosacral intervertebral disc disorder: Secondary | ICD-10-CM | POA: Diagnosis not present

## 2022-05-21 DIAGNOSIS — N189 Chronic kidney disease, unspecified: Secondary | ICD-10-CM | POA: Diagnosis not present

## 2022-05-21 DIAGNOSIS — I4510 Unspecified right bundle-branch block: Secondary | ICD-10-CM | POA: Diagnosis not present

## 2022-05-21 DIAGNOSIS — I119 Hypertensive heart disease without heart failure: Secondary | ICD-10-CM | POA: Diagnosis not present

## 2022-05-21 DIAGNOSIS — E46 Unspecified protein-calorie malnutrition: Secondary | ICD-10-CM | POA: Diagnosis not present

## 2022-05-21 DIAGNOSIS — D62 Acute posthemorrhagic anemia: Secondary | ICD-10-CM | POA: Diagnosis not present

## 2022-05-21 DIAGNOSIS — E781 Pure hyperglyceridemia: Secondary | ICD-10-CM | POA: Diagnosis not present

## 2022-05-21 DIAGNOSIS — D61818 Other pancytopenia: Secondary | ICD-10-CM | POA: Diagnosis not present

## 2022-05-21 DIAGNOSIS — I700 Atherosclerosis of aorta: Secondary | ICD-10-CM | POA: Diagnosis not present

## 2022-05-21 DIAGNOSIS — M1990 Unspecified osteoarthritis, unspecified site: Secondary | ICD-10-CM | POA: Diagnosis not present

## 2022-05-21 DIAGNOSIS — K219 Gastro-esophageal reflux disease without esophagitis: Secondary | ICD-10-CM | POA: Diagnosis not present

## 2022-05-21 DIAGNOSIS — I4891 Unspecified atrial fibrillation: Secondary | ICD-10-CM | POA: Diagnosis not present

## 2022-05-21 DIAGNOSIS — E785 Hyperlipidemia, unspecified: Secondary | ICD-10-CM | POA: Diagnosis not present

## 2022-05-21 DIAGNOSIS — E1140 Type 2 diabetes mellitus with diabetic neuropathy, unspecified: Secondary | ICD-10-CM | POA: Diagnosis not present

## 2022-05-21 DIAGNOSIS — E538 Deficiency of other specified B group vitamins: Secondary | ICD-10-CM | POA: Diagnosis not present

## 2022-05-21 DIAGNOSIS — M5416 Radiculopathy, lumbar region: Secondary | ICD-10-CM | POA: Diagnosis not present

## 2022-05-21 DIAGNOSIS — E1122 Type 2 diabetes mellitus with diabetic chronic kidney disease: Secondary | ICD-10-CM | POA: Diagnosis not present

## 2022-05-21 DIAGNOSIS — L989 Disorder of the skin and subcutaneous tissue, unspecified: Secondary | ICD-10-CM | POA: Diagnosis not present

## 2022-05-21 DIAGNOSIS — I272 Pulmonary hypertension, unspecified: Secondary | ICD-10-CM | POA: Diagnosis not present

## 2022-05-21 DIAGNOSIS — L03115 Cellulitis of right lower limb: Secondary | ICD-10-CM | POA: Diagnosis not present

## 2022-05-25 ENCOUNTER — Encounter: Payer: Medicare Other | Admitting: Family

## 2022-05-26 DIAGNOSIS — L989 Disorder of the skin and subcutaneous tissue, unspecified: Secondary | ICD-10-CM | POA: Diagnosis not present

## 2022-05-26 DIAGNOSIS — I4891 Unspecified atrial fibrillation: Secondary | ICD-10-CM | POA: Diagnosis not present

## 2022-05-26 DIAGNOSIS — B356 Tinea cruris: Secondary | ICD-10-CM | POA: Diagnosis not present

## 2022-05-26 DIAGNOSIS — I890 Lymphedema, not elsewhere classified: Secondary | ICD-10-CM | POA: Diagnosis not present

## 2022-05-26 DIAGNOSIS — E785 Hyperlipidemia, unspecified: Secondary | ICD-10-CM | POA: Diagnosis not present

## 2022-05-26 DIAGNOSIS — I4510 Unspecified right bundle-branch block: Secondary | ICD-10-CM | POA: Diagnosis not present

## 2022-05-26 DIAGNOSIS — E538 Deficiency of other specified B group vitamins: Secondary | ICD-10-CM | POA: Diagnosis not present

## 2022-05-26 DIAGNOSIS — M5416 Radiculopathy, lumbar region: Secondary | ICD-10-CM | POA: Diagnosis not present

## 2022-05-26 DIAGNOSIS — E781 Pure hyperglyceridemia: Secondary | ICD-10-CM | POA: Diagnosis not present

## 2022-05-26 DIAGNOSIS — M519 Unspecified thoracic, thoracolumbar and lumbosacral intervertebral disc disorder: Secondary | ICD-10-CM | POA: Diagnosis not present

## 2022-05-26 DIAGNOSIS — E1140 Type 2 diabetes mellitus with diabetic neuropathy, unspecified: Secondary | ICD-10-CM | POA: Diagnosis not present

## 2022-05-26 DIAGNOSIS — M1990 Unspecified osteoarthritis, unspecified site: Secondary | ICD-10-CM | POA: Diagnosis not present

## 2022-05-26 DIAGNOSIS — N189 Chronic kidney disease, unspecified: Secondary | ICD-10-CM | POA: Diagnosis not present

## 2022-05-26 DIAGNOSIS — E46 Unspecified protein-calorie malnutrition: Secondary | ICD-10-CM | POA: Diagnosis not present

## 2022-05-26 DIAGNOSIS — I272 Pulmonary hypertension, unspecified: Secondary | ICD-10-CM | POA: Diagnosis not present

## 2022-05-26 DIAGNOSIS — L03115 Cellulitis of right lower limb: Secondary | ICD-10-CM | POA: Diagnosis not present

## 2022-05-26 DIAGNOSIS — K219 Gastro-esophageal reflux disease without esophagitis: Secondary | ICD-10-CM | POA: Diagnosis not present

## 2022-05-26 DIAGNOSIS — I119 Hypertensive heart disease without heart failure: Secondary | ICD-10-CM | POA: Diagnosis not present

## 2022-05-26 DIAGNOSIS — D62 Acute posthemorrhagic anemia: Secondary | ICD-10-CM | POA: Diagnosis not present

## 2022-05-26 DIAGNOSIS — E1122 Type 2 diabetes mellitus with diabetic chronic kidney disease: Secondary | ICD-10-CM | POA: Diagnosis not present

## 2022-05-26 DIAGNOSIS — D61818 Other pancytopenia: Secondary | ICD-10-CM | POA: Diagnosis not present

## 2022-05-26 DIAGNOSIS — I700 Atherosclerosis of aorta: Secondary | ICD-10-CM | POA: Diagnosis not present

## 2022-06-01 ENCOUNTER — Encounter: Payer: Self-pay | Admitting: Orthopedic Surgery

## 2022-06-01 DIAGNOSIS — D62 Acute posthemorrhagic anemia: Secondary | ICD-10-CM | POA: Diagnosis not present

## 2022-06-01 DIAGNOSIS — L989 Disorder of the skin and subcutaneous tissue, unspecified: Secondary | ICD-10-CM | POA: Diagnosis not present

## 2022-06-01 DIAGNOSIS — I4891 Unspecified atrial fibrillation: Secondary | ICD-10-CM | POA: Diagnosis not present

## 2022-06-01 DIAGNOSIS — M5416 Radiculopathy, lumbar region: Secondary | ICD-10-CM | POA: Diagnosis not present

## 2022-06-01 DIAGNOSIS — I272 Pulmonary hypertension, unspecified: Secondary | ICD-10-CM | POA: Diagnosis not present

## 2022-06-01 DIAGNOSIS — I700 Atherosclerosis of aorta: Secondary | ICD-10-CM | POA: Diagnosis not present

## 2022-06-01 DIAGNOSIS — M1990 Unspecified osteoarthritis, unspecified site: Secondary | ICD-10-CM | POA: Diagnosis not present

## 2022-06-01 DIAGNOSIS — I4510 Unspecified right bundle-branch block: Secondary | ICD-10-CM | POA: Diagnosis not present

## 2022-06-01 DIAGNOSIS — I890 Lymphedema, not elsewhere classified: Secondary | ICD-10-CM | POA: Diagnosis not present

## 2022-06-01 DIAGNOSIS — I119 Hypertensive heart disease without heart failure: Secondary | ICD-10-CM | POA: Diagnosis not present

## 2022-06-01 DIAGNOSIS — M519 Unspecified thoracic, thoracolumbar and lumbosacral intervertebral disc disorder: Secondary | ICD-10-CM | POA: Diagnosis not present

## 2022-06-01 DIAGNOSIS — D61818 Other pancytopenia: Secondary | ICD-10-CM | POA: Diagnosis not present

## 2022-06-01 DIAGNOSIS — E785 Hyperlipidemia, unspecified: Secondary | ICD-10-CM | POA: Diagnosis not present

## 2022-06-01 DIAGNOSIS — E46 Unspecified protein-calorie malnutrition: Secondary | ICD-10-CM | POA: Diagnosis not present

## 2022-06-01 DIAGNOSIS — K219 Gastro-esophageal reflux disease without esophagitis: Secondary | ICD-10-CM | POA: Diagnosis not present

## 2022-06-01 DIAGNOSIS — E1140 Type 2 diabetes mellitus with diabetic neuropathy, unspecified: Secondary | ICD-10-CM | POA: Diagnosis not present

## 2022-06-01 DIAGNOSIS — E538 Deficiency of other specified B group vitamins: Secondary | ICD-10-CM | POA: Diagnosis not present

## 2022-06-01 DIAGNOSIS — B356 Tinea cruris: Secondary | ICD-10-CM | POA: Diagnosis not present

## 2022-06-01 DIAGNOSIS — E781 Pure hyperglyceridemia: Secondary | ICD-10-CM | POA: Diagnosis not present

## 2022-06-01 DIAGNOSIS — N189 Chronic kidney disease, unspecified: Secondary | ICD-10-CM | POA: Diagnosis not present

## 2022-06-01 DIAGNOSIS — L03115 Cellulitis of right lower limb: Secondary | ICD-10-CM | POA: Diagnosis not present

## 2022-06-01 DIAGNOSIS — E1122 Type 2 diabetes mellitus with diabetic chronic kidney disease: Secondary | ICD-10-CM | POA: Diagnosis not present

## 2022-06-01 NOTE — Progress Notes (Signed)
Office Visit Note   Patient: Kathleen Wiley           Date of Birth: [birth year]           MRN: [id number] Visit Date: 05/13/2022              Requested by: Kathleen Jordan, MD [address] [place],  [place] [address] PCP: Kathleen Jordan, MD  Chief Complaint  Patient presents with   Right Leg - Routine Post Op    04/23/22 right leg debridement of abscess      HPI: Patient is a 76 year old woman who presents 3 weeks status post debridement right leg abscess.  Patient went to the emergency department last week.  Patient went to skilled nursing and left without being discharged.  Patient has not been on antibiotics for 5 days.  Assessment & Plan: Visit Diagnoses:  1. Ulcer of right lower extremity with muscle involvement without evidence of necrosis ([place])     Plan: Recommend a size extra-large compression sock.  Sutures are harvested.  Follow-Up Instructions: Return in about 3 weeks (around 06/03/2022).   Ortho Exam  Patient is alert, oriented, no adenopathy, well-dressed, normal affect, normal respiratory effort. Examination there is no drainage there is a large eschar over the wound bed.  Imaging: No results found. No images are attached to the encounter.  Labs: Lab Results  Component Value Date   HGBA1C 5.2 04/20/2022   HGBA1C 6.4 (H) 03/15/2021   HGBA1C 7.2 (H) 10/26/2019   CRP 14.6 (H) 04/26/2022   CRP 12.3 (H) 04/25/2022   CRP 16.0 (H) 04/24/2022   REPTSTATUS 05/08/2022 FINAL 05/03/2022   GRAMSTAIN NO WBC SEEN NO ORGANISMS SEEN  04/23/2022   CULT  05/03/2022    NO GROWTH 5 DAYS Performed at [facility], [place] [address], [place], [place] [address]    LABORGA KLEBSIELLA PNEUMONIAE (A) 04/19/2022     Lab Results  Component Value Date   ALBUMIN 2.1 (L) 05/04/2022   ALBUMIN 2.4 (L) 05/03/2022   ALBUMIN 1.8 (L) 04/26/2022   PREALBUMIN 14.4 (L) 05/04/2022    Lab Results  Component Value Date   MG 2.2 05/03/2022   MG 1.9 04/26/2022    MG 1.8 04/25/2022   No results found for: "VD25OH"  Lab Results  Component Value Date   PREALBUMIN 14.4 (L) 05/04/2022      Latest Ref Rng & Units 05/06/2022    3:43 AM 05/05/2022    3:01 AM 05/04/2022    3:24 AM  CBC EXTENDED  WBC 4.0 - 10.5 K/uL 1.9  2.4  2.2   RBC 3.87 - 5.11 MIL/uL 2.68  2.60  2.59   Hemoglobin 12.0 - 15.0 g/dL 8.8  8.4  8.4   HCT 36.0 - 46.0 % 27.5  27.1  26.9   Platelets 150 - 400 K/uL 187  210  212   NEUT# 1.7 - 7.7 K/uL 0.8  1.0    Lymph# 0.7 - 4.0 K/uL 0.9  1.1       There is no height or weight on file to calculate BMI.  Orders:  No orders of the defined types were placed in this encounter.  No orders of the defined types were placed in this encounter.    Procedures: No procedures performed  Clinical Data: No additional findings.  ROS:  All other systems negative, except as noted in the HPI. Review of Systems  Objective: Vital Signs: There were no vitals taken for this  visit.  Specialty Comments:  No specialty comments available.  PMFS History: Patient Active Problem List   Diagnosis Date Noted   Prolonged QT interval 05/04/2022   Elevated TSH 05/04/2022   Abnormal ECG 05/04/2022   Cellulitis 14/97/0263   Diastolic dysfunction with chronic heart failure ([place]) 05/03/2022   Sinus bradycardia 05/03/2022   Group G streptococcal infection 04/21/2022   Sepsis ([place]) 04/20/2022   Cellulitis of right lower extremity 04/20/2022   Gallstone pancreatitis 03/15/2021   Acute cholecystitis 03/15/2021   Controlled type 2 diabetes mellitus with neuropathy ([place]) 03/15/2021   Obesity with body mass index (BMI) of 30.0 to 39.9 03/15/2021   Acute gallstone pancreatitis 03/15/2021   Abnormal cervical Papanicolaou smear 02/18/2021   Abnormal liver function tests 02/18/2021   Acquired hallux valgus 02/18/2021   Acute hypoxemic respiratory failure ([place]) 02/18/2021   Aneurysm of thoracic aorta ([place]) 02/18/2021   Anxiety 02/18/2021   Aortic root  dilatation ([place]) 02/18/2021   Aortic valve disorder 02/18/2021   Arthropathy 02/18/2021   Callosity 02/18/2021   Cardiomegaly 02/18/2021   Chest discomfort 02/18/2021   Chronic kidney disease (CKD) stage G2/A1, mildly decreased glomerular filtration rate (GFR) between 60-89 mL/min/1.73 square meter and albuminuria creatinine ratio less than 30 mg/g 02/18/2021   Chronic kidney disease, stage 2 (mild) 02/18/2021   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   COVID-19 virus infection 02/18/2021   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   Edema 02/18/2021   Edema of both lower legs 02/18/2021   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   Leukopenia 02/18/2021   Low back pain 02/18/2021   Malabsorption syndrome 78/58/8502   Metabolic syndrome 77/41/2878   Mild cervical dysplasia 02/18/2021   Mixed anxiety and depressive disorder 02/18/2021   Neuropathy 02/18/2021   Obstructive sleep apnea syndrome 02/18/2021   Senile purpura ([place]) 02/18/2021   Snoring 02/18/2021   Elevated transaminase level 02/18/2021   Type 2 diabetes mellitus with hyperglycemia ([place]) 02/18/2021   Urge incontinence of urine 02/18/2021   Vitamin B12 deficiency (non anemic) 02/18/2021   Vitamin D deficiency 02/18/2021   Pneumonia due to COVID-19 virus 10/25/2019   Hypokalemia 10/25/2019   Hyperbilirubinemia 10/25/2019   Pre-diabetes    History of total hip arthroplasty, right 09/18/2018   Osteoarthritis of right hip 09/14/2018   Morbid obesity, BMI not known ([place]) 09/17/2016   Stasis ulcer ([place]) 09/17/2016   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 ([place]) 08/29/2016   Cellulitis of left lower leg 08/10/2016   Infection of flexor tendon sheath 06/14/2016   Pain of right thumb 06/14/2016   GERD (gastroesophageal reflux disease) 02/09/2013   Essential hypertension, benign 02/09/2013   Hypercholesterolemia 02/09/2013   Unspecified constipation 02/09/2013   Muscle spasm 02/09/2013    Osteoarthritis of right knee 01/26/2013   Osteoarthritis of left hip 05/10/2012   Past Medical History:  Diagnosis Date   Anxiety    Arthritis    knee, hip   Cellulitis 08/2016   left leg   Complication of anesthesia    Diabetes mellitus without complication (HCC)    GERD (gastroesophageal reflux disease)    Heart murmur    "slight "  informed years ago.-    Hernia, umbilical    History of atrial fibrillation 2007   with flagyl   History of endometriosis    Hypercholesteremia    Hypertension    has not been to a cardiologist   PONV (postoperative nausea and vomiting)    Rectal vaginal fistula  Refusal of blood transfusions as patient is Jehovah's Witness    Sleep apnea     Family History  Problem Relation Age of Onset   Dementia Mother    Cancer Father    Breast cancer Neg Hx     Past Surgical History:  Procedure Laterality Date   APPENDECTOMY     BUNIONECTOMY Right 01/09/2019   Procedure: RIGHT FOOT BUNION CORRECTION;  Surgeon: Erle Crocker, MD;  Location: [place];  Service: Orthopedics;  Laterality: Right;   CHOLECYSTECTOMY N/A 03/16/2021   Procedure: LAPAROSCOPIC CHOLECYSTECTOMY;  Surgeon: Stark Klein, MD;  [facility];  Service: General;  Laterality: N/A;   ENDOSCOPIC RETROGRADE CHOLANGIOPANCREATOGRAPHY (ERCP) WITH PROPOFOL N/A 03/15/2021   Procedure: ENDOSCOPIC RETROGRADE CHOLANGIOPANCREATOGRAPHY (ERCP) WITH PROPOFOL;  Surgeon: Ronnette Juniper, MD;  [facility];  Service: Gastroenterology;  Laterality: N/A;   EXPLORATORY LAPAROTOMY  1973   for endometrosis   HAMMERTOE RECONSTRUCTION WITH WEIL OSTEOTOMY Right 01/09/2019   Procedure: RIGHT FOOT 2-4 HAMMERTOE CORRECTION WITH WEIL OSTEOTOMY 2ND METATARSAL, DEEP TENDON TRANSFER WITH AKIN OSTEOTOMY;  Surgeon: Erle Crocker, MD;  Location: [place];  Service: Orthopedics;  Laterality: Right;   I & D EXTREMITY Right 06/10/2016   Procedure: IRRIGATION AND DEBRIDEMENT THUMB FLEXOR SHEATH;  Surgeon: Leanora Cover, MD;  Location: [place];  Service: Orthopedics;  Laterality: Right;   I & D EXTREMITY Right 04/23/2022   Procedure: RIGHT LEG DEBRIDEMENT;  Surgeon: Newt Minion, MD;  Location: [place];  Service: Orthopedics;  Laterality: Right;   JOINT REPLACEMENT     Left Hip   MANDIBLE SURGERY  1978   rectovaginal fistula repair  1980   REMOVAL OF STONES  03/15/2021   Procedure: REMOVAL OF STONES;  Surgeon: Ronnette Juniper, MD;  [facility];  Service: Gastroenterology;;   Joan Mayans  03/15/2021   Procedure: Joan Mayans;  Surgeon: Ronnette Juniper, MD;  [facility];  Service: Gastroenterology;;   TOTAL HIP ARTHROPLASTY  05/08/2012   Procedure: TOTAL HIP ARTHROPLASTY;  Surgeon: Kerin Salen, MD;  Location: [place];  Service: Orthopedics;  Laterality: Left;   TOTAL HIP ARTHROPLASTY Right 09/18/2018   Procedure: TOTAL HIP ARTHROPLASTY ANTERIOR APPROACH;  Surgeon: Frederik Pear, MD;  [facility];  Service: Orthopedics;  Laterality: Right;   TOTAL KNEE ARTHROPLASTY Right 01/24/2013   Dr Mayer Camel   TOTAL KNEE ARTHROPLASTY Right 01/24/2013   Procedure: RIGHT TOTAL KNEE ARTHROPLASTY;  Surgeon: Kerin Salen, MD;  Location: [facility];  Service: Orthopedics;  Laterality: Right;   Social History   Occupational History   Not on file  Tobacco Use   Smoking status: Former    Packs/day: 1.00    Years: 10.00    Total pack years: 10.00    Types: Cigarettes    Quit date: 11/08/1981    Years since quitting: 40.5   Smokeless tobacco: Never  Vaping Use   Vaping Use: Never used  Substance and Sexual Activity   Alcohol use: No   Drug use: No   Sexual activity: Not on file

## 2022-06-02 DIAGNOSIS — L97819 Non-pressure chronic ulcer of other part of right lower leg with unspecified severity: Secondary | ICD-10-CM | POA: Diagnosis not present

## 2022-06-02 DIAGNOSIS — I87311 Chronic venous hypertension (idiopathic) with ulcer of right lower extremity: Secondary | ICD-10-CM | POA: Diagnosis not present

## 2022-06-02 DIAGNOSIS — L97211 Non-pressure chronic ulcer of right calf limited to breakdown of skin: Secondary | ICD-10-CM | POA: Diagnosis not present

## 2022-06-03 ENCOUNTER — Ambulatory Visit: Payer: Medicare Other | Admitting: Orthopedic Surgery

## 2022-06-03 DIAGNOSIS — L97915 Non-pressure chronic ulcer of unspecified part of right lower leg with muscle involvement without evidence of necrosis: Secondary | ICD-10-CM | POA: Diagnosis not present

## 2022-06-04 ENCOUNTER — Encounter: Payer: Self-pay | Admitting: Orthopedic Surgery

## 2022-06-04 ENCOUNTER — Inpatient Hospital Stay: Payer: Medicare Other

## 2022-06-04 ENCOUNTER — Inpatient Hospital Stay: Payer: Medicare Other | Attending: Hematology and Oncology | Admitting: Hematology and Oncology

## 2022-06-04 ENCOUNTER — Other Ambulatory Visit: Payer: Self-pay

## 2022-06-04 VITALS — BP 144/60 | HR 61 | Temp 98.0°F | Resp 14 | Wt 224.2 lb

## 2022-06-04 DIAGNOSIS — D72819 Decreased white blood cell count, unspecified: Secondary | ICD-10-CM | POA: Insufficient documentation

## 2022-06-04 DIAGNOSIS — Z87891 Personal history of nicotine dependence: Secondary | ICD-10-CM | POA: Insufficient documentation

## 2022-06-04 DIAGNOSIS — M79606 Pain in leg, unspecified: Secondary | ICD-10-CM | POA: Insufficient documentation

## 2022-06-04 DIAGNOSIS — D708 Other neutropenia: Secondary | ICD-10-CM

## 2022-06-04 DIAGNOSIS — D539 Nutritional anemia, unspecified: Secondary | ICD-10-CM

## 2022-06-04 LAB — RETIC PANEL
Immature Retic Fract: 15.4 % (ref 2.3–15.9)
RBC.: 3.2 MIL/uL — ABNORMAL LOW (ref 3.87–5.11)
Retic Count, Absolute: 117.8 10*3/uL (ref 19.0–186.0)
Retic Ct Pct: 3.7 % — ABNORMAL HIGH (ref 0.4–3.1)
Reticulocyte Hemoglobin: 33.6 pg (ref >27.9)

## 2022-06-04 LAB — CBC WITH DIFFERENTIAL (CANCER CENTER ONLY)
Abs Immature Granulocytes: 0.03 10*3/uL (ref 0.00–0.07)
Basophils Absolute: 0 10*3/uL (ref 0.0–0.1)
Basophils Relative: 1 %
Eosinophils Absolute: 0.1 10*3/uL (ref 0.0–0.5)
Eosinophils Relative: 2 %
HCT: 31.9 % — ABNORMAL LOW (ref 36.0–46.0)
Hemoglobin: 10.6 g/dL — ABNORMAL LOW (ref 12.0–15.0)
Immature Granulocytes: 1 %
Lymphocytes Relative: 44 %
Lymphs Abs: 1.1 10*3/uL (ref 0.7–4.0)
MCH: 33.7 pg (ref 26.0–34.0)
MCHC: 33.2 g/dL (ref 30.0–36.0)
MCV: 101.3 fL — ABNORMAL HIGH (ref 80.0–100.0)
Monocytes Absolute: 0.2 10*3/uL (ref 0.1–1.0)
Monocytes Relative: 7 %
Neutro Abs: 1.1 10*3/uL — ABNORMAL LOW (ref 1.7–7.7)
Neutrophils Relative %: 45 %
Platelet Count: 156 10*3/uL (ref 150–400)
RBC: 3.15 MIL/uL — ABNORMAL LOW (ref 3.87–5.11)
RDW: 16.2 % — ABNORMAL HIGH (ref 11.5–15.5)
WBC Count: 2.5 10*3/uL — ABNORMAL LOW (ref 4.0–10.5)
nRBC: 0 % (ref 0.0–0.2)

## 2022-06-04 LAB — CMP (CANCER CENTER ONLY)
ALT: 14 U/L (ref 0–44)
AST: 21 U/L (ref 15–41)
Albumin: 4 g/dL (ref 3.5–5.0)
Alkaline Phosphatase: 71 U/L (ref 38–126)
Anion gap: 7 (ref 5–15)
BUN: 13 mg/dL (ref 8–23)
CO2: 27 mmol/L (ref 22–32)
Calcium: 9 mg/dL (ref 8.9–10.3)
Chloride: 105 mmol/L (ref 98–111)
Creatinine: 0.65 mg/dL (ref 0.44–1.00)
GFR, Estimated: >60 mL/min (ref >60)
Glucose, Bld: 115 mg/dL — ABNORMAL HIGH (ref 70–99)
Potassium: 4.2 mmol/L (ref 3.5–5.1)
Sodium: 139 mmol/L (ref 135–145)
Total Bilirubin: 0.8 mg/dL (ref 0.3–1.2)
Total Protein: 7 g/dL (ref 6.5–8.1)

## 2022-06-04 LAB — IRON AND IRON BINDING CAPACITY (CC-WL,HP ONLY)
Iron: 97 ug/dL (ref 28–170)
Saturation Ratios: 27 % (ref 10.4–31.8)
TIBC: 361 ug/dL (ref 250–450)
UIBC: 264 ug/dL (ref 148–442)

## 2022-06-04 LAB — TSH: TSH: 4.143 u[IU]/mL (ref 0.350–4.500)

## 2022-06-04 LAB — FOLATE: Folate: 8.7 ng/mL (ref >5.9)

## 2022-06-04 LAB — LACTATE DEHYDROGENASE: LDH: 215 U/L — ABNORMAL HIGH (ref 98–192)

## 2022-06-04 LAB — FERRITIN: Ferritin: 99 ng/mL (ref 11–307)

## 2022-06-04 LAB — VITAMIN B12: Vitamin B-12: 664 pg/mL (ref 180–914)

## 2022-06-04 NOTE — Progress Notes (Signed)
[place] [phone]) [phone]   Fax:([phone]) [place] NOTE  Patient Care Team: Jonathon Jordan, MD as PCP - General (Family Medicine) Stanford Breed Denice Bors, MD as PCP - Cardiology (Cardiology)  Hematological/Oncological History # Macrocytic Anemia # Leukopenia 05/06/2022: WBC 1.9, Hgb 8.8, MCV 102.6, Plt 187 06/04/2022: establish care with Dr. Lorenso Courier   CHIEF COMPLAINTS/PURPOSE OF CONSULTATION:  "Macrocytic Anemia/Leukopenia "  HISTORY OF PRESENTING ILLNESS:  Kathleen Wiley 76 y.o. female with medical history significant for GERD endometriosis, hypertension, type 2 diabetes, anxiety, and type 2 diabetes who presents for evaluation of microcytic anemia and leukopenia.  On review of the previous records Kathleen Wiley has a longstanding history of macrocytosis, anemia, and leukopenia.  Her last normal CBC was on 03/14/2021 at a time showed white blood cell count 3.9, hemoglobin 12.7, MCV 99.7, and platelets of 201.  She first began developing macrocytosis around 04/19/2022.  At that time she had a MCV of 100.7.  At that time she had a white blood cell count 4.1, hemoglobin 13.4, and platelets were clumped at that time.  Most recently on 05/06/2022 patient white blood cell count 1.9, hemoglobin 8.8, MCV 102.6, and platelets of 187.  Due to concern for this progressive cytopenias she was referred to hematology for further evaluation and management.  On exam today Kathleen Wiley reports that she has developed leg wounds which are currently being followed by Dr. Asa Lente.  She notes that she eats a regular diet and does enjoy eating red meat approximately 3-4 times per week.  She has no other restrictions in her diet.  She reports that she has lost some weight due to pain in her legs.  She reports that her legs are currently all wrapped up but that she has lesions which are "crusted over and reddish/black.".  She reports that lately they have been looking good and healing.  She notes  that she is currently taking vitamin B12 supplementation.  She notes she has not been having any issues with lightheadedness or dizziness.  She reports her energy is not great and ranks is about 5 out of 10.  She notes that she has been "lazy because of leg pain".  On further discussion she notes that her mother had dementia and her father died of colon cancer.  She reports her sister had cancer of unclear etiology.  Patient reports she has 1 son who is healthy as well as 3 granddaughters and 4 great-grandchildren.  She is a never smoker and almost never drinks alcohol.  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  She does that she is not having any issues with bleeding, bruising, or dark stools.  She notes her biggest problem is her "stupid cellulitis".  A full 10 point ROS is listed below.  MEDICAL HISTORY:  Past Medical History:  Diagnosis Date   Anxiety    Arthritis    knee, hip   Cellulitis 08/2016   left leg   Complication of anesthesia    Diabetes mellitus without complication (HCC)    GERD (gastroesophageal reflux disease)    Heart murmur    "slight "  informed years ago.-    Hernia, umbilical    History of atrial fibrillation 2007   with flagyl   History of endometriosis    Hypercholesteremia    Hypertension    has not been to a cardiologist   PONV (postoperative nausea and vomiting)    Rectal vaginal fistula  Refusal of blood transfusions as patient is Jehovah's Witness    Sleep apnea     SURGICAL HISTORY: Past Surgical History:  Procedure Laterality Date   APPENDECTOMY     BUNIONECTOMY Right 01/09/2019   Procedure: RIGHT FOOT BUNION CORRECTION;  Surgeon: Erle Crocker, MD;  Location: [facility];  Service: Orthopedics;  Laterality: Right;   CHOLECYSTECTOMY N/A 03/16/2021   Procedure: LAPAROSCOPIC CHOLECYSTECTOMY;  Surgeon: Stark Klein, MD;  Location: [facility];  Service: General;  Laterality: N/A;   ENDOSCOPIC RETROGRADE CHOLANGIOPANCREATOGRAPHY (ERCP) WITH  PROPOFOL N/A 03/15/2021   Procedure: ENDOSCOPIC RETROGRADE CHOLANGIOPANCREATOGRAPHY (ERCP) WITH PROPOFOL;  Surgeon: Ronnette Juniper, MD;  Location: [facility];  Service: Gastroenterology;  Laterality: N/A;   EXPLORATORY LAPAROTOMY  1973   for endometrosis   HAMMERTOE RECONSTRUCTION WITH WEIL OSTEOTOMY Right 01/09/2019   Procedure: RIGHT FOOT 2-4 HAMMERTOE CORRECTION WITH WEIL OSTEOTOMY 2ND METATARSAL, DEEP TENDON TRANSFER WITH AKIN OSTEOTOMY;  Surgeon: Erle Crocker, MD;  Location: [facility];  Service: Orthopedics;  Laterality: Right;   I & D EXTREMITY Right 06/10/2016   Procedure: IRRIGATION AND DEBRIDEMENT THUMB FLEXOR SHEATH;  Surgeon: Leanora Cover, MD;  Location: [facility];  Service: Orthopedics;  Laterality: Right;   I & D EXTREMITY Right 04/23/2022   Procedure: RIGHT LEG DEBRIDEMENT;  Surgeon: Newt Minion, MD;  Location: [facility];  Service: Orthopedics;  Laterality: Right;   JOINT REPLACEMENT     Left Hip   MANDIBLE SURGERY  1978   rectovaginal fistula repair  1980   REMOVAL OF STONES  03/15/2021   Procedure: REMOVAL OF STONES;  Surgeon: Ronnette Juniper, MD;  Location: [facility];  Service: Gastroenterology;;   Joan Mayans  03/15/2021   Procedure: Joan Mayans;  Surgeon: Ronnette Juniper, MD;  Location: [facility];  Service: Gastroenterology;;   TOTAL HIP ARTHROPLASTY  05/08/2012   Procedure: TOTAL HIP ARTHROPLASTY;  Surgeon: Kerin Salen, MD;  Location: [facility];  Service: Orthopedics;  Laterality: Left;   TOTAL HIP ARTHROPLASTY Right 09/18/2018   Procedure: TOTAL HIP ARTHROPLASTY ANTERIOR APPROACH;  Surgeon: Frederik Pear, MD;  Location: [facility];  Service: Orthopedics;  Laterality: Right;   TOTAL KNEE ARTHROPLASTY Right 01/24/2013   Dr Mayer Camel   TOTAL KNEE ARTHROPLASTY Right 01/24/2013   Procedure: RIGHT TOTAL KNEE ARTHROPLASTY;  Surgeon: Kerin Salen, MD;  Location: [facility];  Service: Orthopedics;  Laterality: Right;    SOCIAL HISTORY: Social History   Socioeconomic History   Marital status: Married     Spouse name: Not on file   Number of children: 1   Years of education: Not on file   Highest education level: Not on file  Occupational History   Not on file  Tobacco Use   Smoking status: Former    Packs/day: 1.00    Years: 10.00    Total pack years: 10.00    Types: Cigarettes    Quit date: 11/08/1981    Years since quitting: 40.6   Smokeless tobacco: Never  Vaping Use   Vaping Use: Never used  Substance and Sexual Activity   Alcohol use: No   Drug use: No   Sexual activity: Not on file  Other Topics Concern   Not on file  Social History Narrative   Not on file   Social Determinants of Health   Financial Resource Strain: Not on file  Food Insecurity: Not on file  Transportation Needs: Not on file  Physical Activity: Not on file  Stress: Not on file  Social Connections: Not on file  Intimate Partner Violence: Not on file    FAMILY HISTORY: Family History  Problem Relation Age of Onset   Dementia Mother    Cancer Father    Breast cancer Neg Hx     ALLERGIES:  is allergic to other, atorvastatin, metronidazole, oxybutynin, and septra [sulfamethoxazole-trimethoprim].  MEDICATIONS:  Current Outpatient Medications  Medication Sig Dispense Refill   acetaminophen (TYLENOL) 325 MG tablet Take 2 tablets (650 mg total) by mouth every 6 (six) hours as needed. (Patient taking differently: Take 650 mg by mouth every 6 (six) hours as needed for mild pain.)     Cholecalciferol (VITAMIN D) 50 MCG (2000 UT) tablet Take 2,000 Units by mouth daily.      cyanocobalamin 1000 MCG tablet Take 1,000 mcg by mouth daily.      escitalopram (LEXAPRO) 20 MG tablet Take 20 mg by mouth daily.     fluticasone (FLONASE) 50 MCG/ACT nasal spray Place 2 sprays into both nostrils daily as needed for allergies.  (Patient not taking: Reported on 06/04/2022)     furosemide (LASIX) 20 MG tablet Take 20 mg by mouth daily.     gabapentin (NEURONTIN) 300 MG capsule Take 300 mg by mouth 3 (three) times  daily.     metoprolol succinate (TOPROL XL) 25 MG 24 hr tablet Take 0.5 tablets (12.5 mg total) by mouth daily.     omeprazole (PRILOSEC) 20 MG capsule Take 20 mg by mouth 2 (two) times daily before a meal.     oxyCODONE (OXY IR/ROXICODONE) 5 MG immediate release tablet Take 1 tablet (5 mg total) by mouth every 4 (four) hours as needed for severe pain or moderate pain. 5 tablet 0   OZEMPIC, 1 MG/DOSE, 4 MG/3ML SOPN Inject 2 mg into the skin once a week. (Patient not taking: Reported on 06/04/2022)     polyethylene glycol powder (GLYCOLAX/MIRALAX) 17 GM/SCOOP powder Take 17 g by mouth daily as needed for moderate constipation. 238 g 0   potassium chloride (KLOR-CON) 10 MEQ tablet Take 10 mEq by mouth daily.     rosuvastatin (CRESTOR) 5 MG tablet Take 5 mg by mouth at bedtime.     No current facility-administered medications for this visit.    REVIEW OF SYSTEMS:   Constitutional: ( - ) fevers, ( - )  chills , ( - ) night sweats Eyes: ( - ) blurriness of vision, ( - ) double vision, ( - ) watery eyes Ears, nose, mouth, throat, and face: ( - ) mucositis, ( - ) sore throat Respiratory: ( - ) cough, ( - ) dyspnea, ( - ) wheezes Cardiovascular: ( - ) palpitation, ( - ) chest discomfort, ( - ) lower extremity swelling Gastrointestinal:  ( - ) nausea, ( - ) heartburn, ( - ) change in bowel habits Skin: ( - ) abnormal skin rashes Lymphatics: ( - ) new lymphadenopathy, ( - ) easy bruising Neurological: ( - ) numbness, ( - ) tingling, ( - ) new weaknesses Behavioral/Psych: ( - ) mood change, ( - ) new changes  All other systems were reviewed with the patient and are negative.  PHYSICAL EXAMINATION:  Vitals:   06/04/22 1318  BP: (!) 144/60  Pulse: 61  Resp: 14  Temp: 98 F (36.7 C)  SpO2: 97%   Filed Weights   06/04/22 1318  Weight: 224 lb 3.2 oz (101.7 kg)    GENERAL: well appearing elderly Caucasian female in NAD  SKIN: skin color, texture, turgor are normal, no rashes  or significant  lesions EYES: conjunctiva are pink and non-injected, sclera clear LUNGS: clear to auscultation and percussion with normal breathing effort HEART: regular rate & rhythm and no murmurs and no lower extremity edema Musculoskeletal: no cyanosis of digits and no clubbing  PSYCH: alert & oriented x 3, fluent speech NEURO: no focal motor/sensory deficits  LABORATORY DATA:  I have reviewed the data as listed    Latest Ref Rng & Units 06/04/2022    2:02 PM 05/06/2022    3:43 AM 05/05/2022    3:01 AM  CBC  WBC 4.0 - 10.5 K/uL 2.5  1.9  2.4   Hemoglobin 12.0 - 15.0 g/dL 10.6  8.8  8.4   Hematocrit 36.0 - 46.0 % 31.9  27.5  27.1   Platelets 150 - 400 K/uL 156  187  210        Latest Ref Rng & Units 06/04/2022    2:02 PM 05/06/2022    3:43 AM 05/05/2022    3:01 AM  CMP  Glucose 70 - 99 mg/dL 115  112  113   BUN 8 - 23 mg/dL '13  14  14   ' Creatinine 0.44 - 1.00 mg/dL 0.65  0.76  0.76   Sodium 135 - 145 mmol/L 139  138  138   Potassium 3.5 - 5.1 mmol/L 4.2  3.8  3.9   Chloride 98 - 111 mmol/L 105  108  109   CO2 22 - 32 mmol/L '27  20  21   ' Calcium 8.9 - 10.3 mg/dL 9.0  7.9  8.0   Total Protein 6.5 - 8.1 g/dL 7.0     Total Bilirubin 0.3 - 1.2 mg/dL 0.8     Alkaline Phos 38 - 126 U/L 71     AST 15 - 41 U/L 21     ALT 0 - 44 U/L 14        ASSESSMENT & PLAN Kathleen Wiley 76 y.o. female with medical history significant for GERD endometriosis, hypertension, type 2 diabetes, anxiety, and type 2 diabetes who presents for evaluation of microcytic anemia and leukopenia.  After review of the labs, review of the records, and discussion with the patient the patients findings are most consistent with microcytosis, anemia, and leukopenia.  At this time there are numerous possible etiologies including nutritional deficiencies such as vitamin B12 or folate, hemolytic anemia, or bone marrow dysfunction.  We will begin the work-up to determine etiology for these with lab work today.  If no clear answer can  be found we will need to pursue bone marrow biopsy.  #Macrocytic Anemia #Leukopenia -- We will conduct full nutritional work-up to include vitamin B12, folate, homocystine, and methylmalonic acid. --We will order hemolysis labs with reticulocytes and LDH --Multiple myeloma work-up with SPEP and serum free light chains --If no clear etiology can be found in the above blood work will need to pursue a bone marrow biopsy. --Possible component of inflammatory suppression of bone marrow.  We will order ESR and CRP. --Return to clinic pending the results of the above studies.  Orders Placed This Encounter  Procedures   CT BIOPSY    Standing Status:   Future    Standing Expiration Date:   06/13/2023    Order Specific Question:   Lab orders requested (DO NOT place separate lab orders, these will be automatically ordered during procedure specimen collection):    Answer:   Surgical Pathology    Order Specific Question:   Reason for  Exam (SYMPTOM  OR DIAGNOSIS REQUIRED)    Answer:   Macrocytic anemia leukopenia, concerning for bone marrow disorder.  Requesting bone marrow biopsy.    Order Specific Question:   Preferred location?    Answer:   [facility]   CT BONE MARROW BIOPSY & ASPIRATION    Standing Status:   Future    Standing Expiration Date:   06/13/2023    Order Specific Question:   Reason for Exam (SYMPTOM  OR DIAGNOSIS REQUIRED)    Answer:   Macrocytic anemia leukopenia, concerning for bone marrow disorder.  Requesting bone marrow biopsy    Order Specific Question:   Preferred location?    Answer:   [facility]   CBC with Differential (Cancer Center Only)    Standing Status:   Future    Number of Occurrences:   1    Standing Expiration Date:   06/05/2023   CMP ([facility] only)    Standing Status:   Future    Number of Occurrences:   1    Standing Expiration Date:   06/05/2023   Ferritin    Standing Status:   Future    Number of Occurrences:   1    Standing  Expiration Date:   06/05/2023   Iron and Iron Binding Capacity ([facility] only)    Standing Status:   Future    Number of Occurrences:   1    Standing Expiration Date:   06/05/2023   Retic Panel    Standing Status:   Future    Number of Occurrences:   1    Standing Expiration Date:   06/05/2023   Multiple Myeloma Panel (SPEP&IFE w/QIG)    Standing Status:   Future    Number of Occurrences:   1    Standing Expiration Date:   06/04/2023   Kappa/lambda light chains    Standing Status:   Future    Number of Occurrences:   1    Standing Expiration Date:   06/04/2023   Lactate dehydrogenase (LDH)    Standing Status:   Future    Number of Occurrences:   1    Standing Expiration Date:   06/04/2023   TSH    Standing Status:   Future    Number of Occurrences:   1    Standing Expiration Date:   06/05/2023   Methylmalonic acid, serum    Standing Status:   Future    Number of Occurrences:   1    Standing Expiration Date:   06/04/2023   Folate, Serum    Standing Status:   Future    Number of Occurrences:   1    Standing Expiration Date:   06/04/2023   Vitamin B12    Standing Status:   Future    Number of Occurrences:   1    Standing Expiration Date:   06/04/2023   Haptoglobin    Standing Status:   Future    Number of Occurrences:   1    Standing Expiration Date:   06/04/2023   Copper, serum    Standing Status:   Future    Number of Occurrences:   1    Standing Expiration Date:   06/04/2023    All questions were answered. The patient knows to call the clinic with any problems, questions or concerns.  A total of more than 60 minutes were spent on this encounter with face-to-face time and non-face-to-face time, including preparing to  see the patient, ordering tests and/or medications, counseling the patient and coordination of care as outlined above.   Ledell Peoples, MD Department of Hematology/Oncology [place] [facility] Phone: [phone] Pager:  [phone] Email: [email or web address]  06/12/2022 4:51 PM

## 2022-06-04 NOTE — Progress Notes (Signed)
Office Visit Note   Patient: Kathleen Wiley           Date of Birth: December 15, 1945           MRN: [id number] Visit Date: 06/03/2022              Requested by: Jonathon Jordan, MD [address] [place],  [place] [address] PCP: Jonathon Jordan, MD  Chief Complaint  Patient presents with   Right Leg - Routine Post Op    04/23/2022 right leg deb of abscess       HPI: Patient is a 76 year old woman who is seen for evaluation for ulceration right lower extremity patient states that home health nursing has been applying a Unna boot Fridays and Tuesdays.  Patient states she has developed a very large scab over the medial and lateral wounds.  Assessment & Plan: Visit Diagnoses:  1. Ulcer of right lower extremity with muscle involvement without evidence of necrosis ([place])     Plan: Continue with the compression and home health nursing visits twice a week.  Discussed that she will need a cut out in her orthotic to allow for the first ray to plantarflex and unload the lateral column.  She will bring her orthotics with her at the next follow-up appointment so we can modify her orthotics.  Follow-Up Instructions: Return in about 4 weeks (around 07/01/2022).   Ortho Exam  Patient is alert, oriented, no adenopathy, well-dressed, normal affect, normal respiratory effort. Examination there is good epithelialization around the wound edges there is a thin scab over the wound medially and laterally there is no cellulitis no tenderness to palpation no drainage or odor.  Patient also complains of pain over the lateral column with ambulation.  She has a plantarflexed first ray with callus beneath the first metatarsal head and with weightbearing this rolls her foot laterally and puts all the pressure over her lateral foot.  Imaging: No results found. No images are attached to the encounter.  Labs: Lab Results  Component Value Date   HGBA1C 5.2 04/20/2022   HGBA1C 6.4 (H) 03/15/2021   HGBA1C  7.2 (H) 10/26/2019   CRP 14.6 (H) 04/26/2022   CRP 12.3 (H) 04/25/2022   CRP 16.0 (H) 04/24/2022   REPTSTATUS 05/08/2022 FINAL 05/03/2022   GRAMSTAIN NO WBC SEEN NO ORGANISMS SEEN  04/23/2022   CULT  05/03/2022    NO GROWTH 5 DAYS Performed at [facility], [place] [address], [place], [place] [address]    LABORGA KLEBSIELLA PNEUMONIAE (A) 04/19/2022     Lab Results  Component Value Date   ALBUMIN 4.0 06/04/2022   ALBUMIN 2.1 (L) 05/04/2022   ALBUMIN 2.4 (L) 05/03/2022   PREALBUMIN 14.4 (L) 05/04/2022    Lab Results  Component Value Date   MG 2.2 05/03/2022   MG 1.9 04/26/2022   MG 1.8 04/25/2022   No results found for: "VD25OH"  Lab Results  Component Value Date   PREALBUMIN 14.4 (L) 05/04/2022      Latest Ref Rng & Units 06/04/2022    2:02 PM 05/06/2022    3:43 AM 05/05/2022    3:01 AM  CBC EXTENDED  WBC 4.0 - 10.5 K/uL 2.5  1.9  2.4   RBC 3.87 - 5.11 MIL/uL 3.87 - 5.11 MIL/uL 3.20    3.15  2.68  2.60   Hemoglobin 12.0 - 15.0 g/dL 10.6  8.8  8.4   HCT 36.0 - 46.0 % 31.9  27.5  27.1  Platelets 150 - 400 K/uL 156  187  210   NEUT# 1.7 - 7.7 K/uL 1.1  0.8  1.0   Lymph# 0.7 - 4.0 K/uL 1.1  0.9  1.1      There is no height or weight on file to calculate BMI.  Orders:  No orders of the defined types were placed in this encounter.  No orders of the defined types were placed in this encounter.    Procedures: No procedures performed  Clinical Data: No additional findings.  ROS:  All other systems negative, except as noted in the HPI. Review of Systems  Objective: Vital Signs: There were no vitals taken for this visit.  Specialty Comments:  No specialty comments available.  PMFS History: Patient Active Problem List   Diagnosis Date Noted   Prolonged QT interval 05/04/2022   Elevated TSH 05/04/2022   Abnormal ECG 05/04/2022   Cellulitis 16/11/930   Diastolic dysfunction with chronic heart failure ([place]) 05/03/2022   Sinus bradycardia  05/03/2022   Group G streptococcal infection 04/21/2022   Sepsis ([place]) 04/20/2022   Cellulitis of right lower extremity 04/20/2022   Gallstone pancreatitis 03/15/2021   Acute cholecystitis 03/15/2021   Controlled type 2 diabetes mellitus with neuropathy ([place]) 03/15/2021   Obesity with body mass index (BMI) of 30.0 to 39.9 03/15/2021   Acute gallstone pancreatitis 03/15/2021   Abnormal cervical Papanicolaou smear 02/18/2021   Abnormal liver function tests 02/18/2021   Acquired hallux valgus 02/18/2021   Acute hypoxemic respiratory failure ([place]) 02/18/2021   Aneurysm of thoracic aorta ([place]) 02/18/2021   Anxiety 02/18/2021   Aortic root dilatation ([place]) 02/18/2021   Aortic valve disorder 02/18/2021   Arthropathy 02/18/2021   Callosity 02/18/2021   Cardiomegaly 02/18/2021   Chest discomfort 02/18/2021   Chronic kidney disease (CKD) stage G2/A1, mildly decreased glomerular filtration rate (GFR) between 60-89 mL/min/1.73 square meter and albuminuria creatinine ratio less than 30 mg/g 02/18/2021   Chronic kidney disease, stage 2 (mild) 02/18/2021   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   COVID-19 virus infection 02/18/2021   Diabetic renal disease ([place]) 02/18/2021   Edema 02/18/2021   Edema of both lower legs 02/18/2021   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   Leukopenia 02/18/2021   Low back pain 02/18/2021   Malabsorption syndrome 35/57/3220   Metabolic syndrome 25/42/7062   Mild cervical dysplasia 02/18/2021   Mixed anxiety and depressive disorder 02/18/2021   Neuropathy 02/18/2021   Obstructive sleep apnea syndrome 02/18/2021   Senile purpura ([place]) 02/18/2021   Snoring 02/18/2021   Elevated transaminase level 02/18/2021   Type 2 diabetes mellitus with hyperglycemia ([place]) 02/18/2021   Urge incontinence of urine 02/18/2021   Vitamin B12 deficiency (non anemic) 02/18/2021   Vitamin D deficiency 02/18/2021   Pneumonia due to COVID-19 virus 10/25/2019   Hypokalemia  10/25/2019   Hyperbilirubinemia 10/25/2019   Pre-diabetes    History of total hip arthroplasty, right 09/18/2018   Osteoarthritis of right hip 09/14/2018   Morbid obesity, BMI not known ([place]) 09/17/2016   Stasis ulcer ([place]) 09/17/2016   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 ([place]) 08/29/2016   Cellulitis of left lower leg 08/10/2016   Infection of flexor tendon sheath 06/14/2016   Pain of right thumb 06/14/2016   GERD (gastroesophageal reflux disease) 02/09/2013   Essential hypertension, benign 02/09/2013   Hypercholesterolemia 02/09/2013   Unspecified constipation 02/09/2013   Muscle spasm 02/09/2013   Osteoarthritis of right knee 01/26/2013   Osteoarthritis of left  hip 05/10/2012   Past Medical History:  Diagnosis Date   Anxiety    Arthritis    knee, hip   Cellulitis 08/2016   left leg   Complication of anesthesia    Diabetes mellitus without complication (HCC)    GERD (gastroesophageal reflux disease)    Heart murmur    "slight "  informed years ago.-    Hernia, umbilical    History of atrial fibrillation 2007   with flagyl   History of endometriosis    Hypercholesteremia    Hypertension    has not been to a cardiologist   PONV (postoperative nausea and vomiting)    Rectal vaginal fistula    Refusal of blood transfusions as patient is Jehovah's Witness    Sleep apnea     Family History  Problem Relation Age of Onset   Dementia Mother    Cancer Father    Breast cancer Neg Hx     Past Surgical History:  Procedure Laterality Date   APPENDECTOMY     BUNIONECTOMY Right 01/09/2019   Procedure: RIGHT FOOT BUNION CORRECTION;  Surgeon: Erle Crocker, MD;  Location: [place];  Service: Orthopedics;  Laterality: Right;   CHOLECYSTECTOMY N/A 03/16/2021   Procedure: LAPAROSCOPIC CHOLECYSTECTOMY;  Surgeon: Stark Klein, MD;  Location: [facility];  Service: General;  Laterality: N/A;   ENDOSCOPIC RETROGRADE CHOLANGIOPANCREATOGRAPHY (ERCP) WITH  PROPOFOL N/A 03/15/2021   Procedure: ENDOSCOPIC RETROGRADE CHOLANGIOPANCREATOGRAPHY (ERCP) WITH PROPOFOL;  Surgeon: Ronnette Juniper, MD;  [facility];  Service: Gastroenterology;  Laterality: N/A;   EXPLORATORY LAPAROTOMY  1973   for endometrosis   HAMMERTOE RECONSTRUCTION WITH WEIL OSTEOTOMY Right 01/09/2019   Procedure: RIGHT FOOT 2-4 HAMMERTOE CORRECTION WITH WEIL OSTEOTOMY 2ND METATARSAL, DEEP TENDON TRANSFER WITH AKIN OSTEOTOMY;  Surgeon: Erle Crocker, MD;  Location: [place];  Service: Orthopedics;  Laterality: Right;   I & D EXTREMITY Right 06/10/2016   Procedure: IRRIGATION AND DEBRIDEMENT THUMB FLEXOR SHEATH;  Surgeon: Leanora Cover, MD;  Location: [place];  Service: Orthopedics;  Laterality: Right;   I & D EXTREMITY Right 04/23/2022   Procedure: RIGHT LEG DEBRIDEMENT;  Surgeon: Newt Minion, MD;  Location: [place];  Service: Orthopedics;  Laterality: Right;   JOINT REPLACEMENT     Left Hip   MANDIBLE SURGERY  1978   rectovaginal fistula repair  1980   REMOVAL OF STONES  03/15/2021   Procedure: REMOVAL OF STONES;  Surgeon: Ronnette Juniper, MD;  Location[facility];  Service: Gastroenterology;;   Joan Mayans  03/15/2021   Procedure: Joan Mayans;  Surgeon: Ronnette Juniper, MD;  Location: [facility];  Service: Gastroenterology;;   TOTAL HIP ARTHROPLASTY  05/08/2012   Procedure: TOTAL HIP ARTHROPLASTY;  Surgeon: Kerin Salen, MD;  Location: [place];  Service: Orthopedics;  Laterality: Left;   TOTAL HIP ARTHROPLASTY Right 09/18/2018   Procedure: TOTAL HIP ARTHROPLASTY ANTERIOR APPROACH;  Surgeon: Frederik Pear, MD;  [facility];  Service: Orthopedics;  Laterality: Right;   TOTAL KNEE ARTHROPLASTY Right 01/24/2013   Dr Mayer Camel   TOTAL KNEE ARTHROPLASTY Right 01/24/2013   Procedure: RIGHT TOTAL KNEE ARTHROPLASTY;  Surgeon: Kerin Salen, MD;  Location: [place];  Service: Orthopedics;  Laterality: Right;   Social History   Occupational History   Not on file  Tobacco Use   Smoking status:  Former    Packs/day: 1.00    Years: 10.00    Total pack years: 10.00    Types: Cigarettes    Quit date: 11/08/1981  Years since quitting: 40.5   Smokeless tobacco: Never  Vaping Use   Vaping Use: Never used  Substance and Sexual Activity   Alcohol use: No   Drug use: No   Sexual activity: Not on file

## 2022-06-05 LAB — HAPTOGLOBIN: Haptoglobin: 123 mg/dL (ref 42–346)

## 2022-06-07 LAB — KAPPA/LAMBDA LIGHT CHAINS
Kappa free light chain: 45.3 mg/L — ABNORMAL HIGH (ref 3.3–19.4)
Kappa, lambda light chain ratio: 2.08 — ABNORMAL HIGH (ref 0.26–1.65)
Lambda free light chains: 21.8 mg/L (ref 5.7–26.3)

## 2022-06-07 LAB — METHYLMALONIC ACID, SERUM: Methylmalonic Acid, Quantitative: 137 nmol/L (ref 0–378)

## 2022-06-08 DIAGNOSIS — E1122 Type 2 diabetes mellitus with diabetic chronic kidney disease: Secondary | ICD-10-CM | POA: Diagnosis not present

## 2022-06-08 DIAGNOSIS — I272 Pulmonary hypertension, unspecified: Secondary | ICD-10-CM | POA: Diagnosis not present

## 2022-06-08 DIAGNOSIS — D61818 Other pancytopenia: Secondary | ICD-10-CM | POA: Diagnosis not present

## 2022-06-08 DIAGNOSIS — I700 Atherosclerosis of aorta: Secondary | ICD-10-CM | POA: Diagnosis not present

## 2022-06-08 DIAGNOSIS — B356 Tinea cruris: Secondary | ICD-10-CM | POA: Diagnosis not present

## 2022-06-08 DIAGNOSIS — I119 Hypertensive heart disease without heart failure: Secondary | ICD-10-CM | POA: Diagnosis not present

## 2022-06-08 DIAGNOSIS — M5416 Radiculopathy, lumbar region: Secondary | ICD-10-CM | POA: Diagnosis not present

## 2022-06-08 DIAGNOSIS — E538 Deficiency of other specified B group vitamins: Secondary | ICD-10-CM | POA: Diagnosis not present

## 2022-06-08 DIAGNOSIS — E781 Pure hyperglyceridemia: Secondary | ICD-10-CM | POA: Diagnosis not present

## 2022-06-08 DIAGNOSIS — L989 Disorder of the skin and subcutaneous tissue, unspecified: Secondary | ICD-10-CM | POA: Diagnosis not present

## 2022-06-08 DIAGNOSIS — I4891 Unspecified atrial fibrillation: Secondary | ICD-10-CM | POA: Diagnosis not present

## 2022-06-08 DIAGNOSIS — M519 Unspecified thoracic, thoracolumbar and lumbosacral intervertebral disc disorder: Secondary | ICD-10-CM | POA: Diagnosis not present

## 2022-06-08 DIAGNOSIS — M1990 Unspecified osteoarthritis, unspecified site: Secondary | ICD-10-CM | POA: Diagnosis not present

## 2022-06-08 DIAGNOSIS — I4510 Unspecified right bundle-branch block: Secondary | ICD-10-CM | POA: Diagnosis not present

## 2022-06-08 DIAGNOSIS — N189 Chronic kidney disease, unspecified: Secondary | ICD-10-CM | POA: Diagnosis not present

## 2022-06-08 DIAGNOSIS — D62 Acute posthemorrhagic anemia: Secondary | ICD-10-CM | POA: Diagnosis not present

## 2022-06-08 DIAGNOSIS — E1140 Type 2 diabetes mellitus with diabetic neuropathy, unspecified: Secondary | ICD-10-CM | POA: Diagnosis not present

## 2022-06-08 DIAGNOSIS — E46 Unspecified protein-calorie malnutrition: Secondary | ICD-10-CM | POA: Diagnosis not present

## 2022-06-08 DIAGNOSIS — L03115 Cellulitis of right lower limb: Secondary | ICD-10-CM | POA: Diagnosis not present

## 2022-06-08 DIAGNOSIS — I890 Lymphedema, not elsewhere classified: Secondary | ICD-10-CM | POA: Diagnosis not present

## 2022-06-08 DIAGNOSIS — K219 Gastro-esophageal reflux disease without esophagitis: Secondary | ICD-10-CM | POA: Diagnosis not present

## 2022-06-08 DIAGNOSIS — E785 Hyperlipidemia, unspecified: Secondary | ICD-10-CM | POA: Diagnosis not present

## 2022-06-09 DIAGNOSIS — R350 Frequency of micturition: Secondary | ICD-10-CM | POA: Diagnosis not present

## 2022-06-09 LAB — MULTIPLE MYELOMA PANEL, SERUM
Albumin SerPl Elph-Mcnc: 3.5 g/dL (ref 2.9–4.4)
Albumin/Glob SerPl: 1.3 (ref 0.7–1.7)
Alpha 1: 0.2 g/dL (ref 0.0–0.4)
Alpha2 Glob SerPl Elph-Mcnc: 0.6 g/dL (ref 0.4–1.0)
B-Globulin SerPl Elph-Mcnc: 1 g/dL (ref 0.7–1.3)
Gamma Glob SerPl Elph-Mcnc: 1 g/dL (ref 0.4–1.8)
Globulin, Total: 2.8 g/dL (ref 2.2–3.9)
IgA: 317 mg/dL (ref 64–422)
IgG (Immunoglobin G), Serum: 882 mg/dL (ref 586–1602)
IgM (Immunoglobulin M), Srm: 88 mg/dL (ref 26–217)
Total Protein ELP: 6.3 g/dL (ref 6.0–8.5)

## 2022-06-09 LAB — COPPER, SERUM: Copper: 126 ug/dL (ref 80–158)

## 2022-06-10 DIAGNOSIS — M519 Unspecified thoracic, thoracolumbar and lumbosacral intervertebral disc disorder: Secondary | ICD-10-CM | POA: Diagnosis not present

## 2022-06-10 DIAGNOSIS — B356 Tinea cruris: Secondary | ICD-10-CM | POA: Diagnosis not present

## 2022-06-10 DIAGNOSIS — L03115 Cellulitis of right lower limb: Secondary | ICD-10-CM | POA: Diagnosis not present

## 2022-06-10 DIAGNOSIS — M1990 Unspecified osteoarthritis, unspecified site: Secondary | ICD-10-CM | POA: Diagnosis not present

## 2022-06-10 DIAGNOSIS — E538 Deficiency of other specified B group vitamins: Secondary | ICD-10-CM | POA: Diagnosis not present

## 2022-06-10 DIAGNOSIS — E781 Pure hyperglyceridemia: Secondary | ICD-10-CM | POA: Diagnosis not present

## 2022-06-10 DIAGNOSIS — M5416 Radiculopathy, lumbar region: Secondary | ICD-10-CM | POA: Diagnosis not present

## 2022-06-10 DIAGNOSIS — L989 Disorder of the skin and subcutaneous tissue, unspecified: Secondary | ICD-10-CM | POA: Diagnosis not present

## 2022-06-10 DIAGNOSIS — K219 Gastro-esophageal reflux disease without esophagitis: Secondary | ICD-10-CM | POA: Diagnosis not present

## 2022-06-10 DIAGNOSIS — N189 Chronic kidney disease, unspecified: Secondary | ICD-10-CM | POA: Diagnosis not present

## 2022-06-10 DIAGNOSIS — D61818 Other pancytopenia: Secondary | ICD-10-CM | POA: Diagnosis not present

## 2022-06-10 DIAGNOSIS — I890 Lymphedema, not elsewhere classified: Secondary | ICD-10-CM | POA: Diagnosis not present

## 2022-06-10 DIAGNOSIS — I4891 Unspecified atrial fibrillation: Secondary | ICD-10-CM | POA: Diagnosis not present

## 2022-06-10 DIAGNOSIS — D62 Acute posthemorrhagic anemia: Secondary | ICD-10-CM | POA: Diagnosis not present

## 2022-06-10 DIAGNOSIS — E46 Unspecified protein-calorie malnutrition: Secondary | ICD-10-CM | POA: Diagnosis not present

## 2022-06-10 DIAGNOSIS — I119 Hypertensive heart disease without heart failure: Secondary | ICD-10-CM | POA: Diagnosis not present

## 2022-06-10 DIAGNOSIS — E1122 Type 2 diabetes mellitus with diabetic chronic kidney disease: Secondary | ICD-10-CM | POA: Diagnosis not present

## 2022-06-10 DIAGNOSIS — I4510 Unspecified right bundle-branch block: Secondary | ICD-10-CM | POA: Diagnosis not present

## 2022-06-10 DIAGNOSIS — I700 Atherosclerosis of aorta: Secondary | ICD-10-CM | POA: Diagnosis not present

## 2022-06-10 DIAGNOSIS — E785 Hyperlipidemia, unspecified: Secondary | ICD-10-CM | POA: Diagnosis not present

## 2022-06-10 DIAGNOSIS — E1140 Type 2 diabetes mellitus with diabetic neuropathy, unspecified: Secondary | ICD-10-CM | POA: Diagnosis not present

## 2022-06-10 DIAGNOSIS — I272 Pulmonary hypertension, unspecified: Secondary | ICD-10-CM | POA: Diagnosis not present

## 2022-06-11 DIAGNOSIS — I87311 Chronic venous hypertension (idiopathic) with ulcer of right lower extremity: Secondary | ICD-10-CM | POA: Diagnosis not present

## 2022-06-11 DIAGNOSIS — L97211 Non-pressure chronic ulcer of right calf limited to breakdown of skin: Secondary | ICD-10-CM | POA: Diagnosis not present

## 2022-06-14 ENCOUNTER — Telehealth: Payer: Self-pay | Admitting: *Deleted

## 2022-06-14 NOTE — Telephone Encounter (Signed)
TCT patient regarding recent lab results. No answer but was able to leave vm message for pt to return this call at her earliest convenience @ [phone]

## 2022-06-14 NOTE — Telephone Encounter (Signed)
-----  Message from Orson Slick, MD sent at 06/12/2022  4:50 PM EDT ----- Regarding: P1 Please call Kathleen Wiley to let her know that we did not find a clear etiology for her macrocytic anemia.  We will need to pursue a bone marrow biopsy.  Request has been placed for IR to schedule this bone marrow biopsy.  We will plan to see her back pending the results of this biopsy. ----- Message ----- From: Interface, Lab In [place] Sent: 06/04/2022   2:39 PM EDT To: Orson Slick, MD

## 2022-06-15 DIAGNOSIS — I700 Atherosclerosis of aorta: Secondary | ICD-10-CM | POA: Diagnosis not present

## 2022-06-15 DIAGNOSIS — I4510 Unspecified right bundle-branch block: Secondary | ICD-10-CM | POA: Diagnosis not present

## 2022-06-15 DIAGNOSIS — E1122 Type 2 diabetes mellitus with diabetic chronic kidney disease: Secondary | ICD-10-CM | POA: Diagnosis not present

## 2022-06-15 DIAGNOSIS — I4891 Unspecified atrial fibrillation: Secondary | ICD-10-CM | POA: Diagnosis not present

## 2022-06-15 DIAGNOSIS — I119 Hypertensive heart disease without heart failure: Secondary | ICD-10-CM | POA: Diagnosis not present

## 2022-06-15 DIAGNOSIS — E1140 Type 2 diabetes mellitus with diabetic neuropathy, unspecified: Secondary | ICD-10-CM | POA: Diagnosis not present

## 2022-06-15 DIAGNOSIS — D62 Acute posthemorrhagic anemia: Secondary | ICD-10-CM | POA: Diagnosis not present

## 2022-06-15 DIAGNOSIS — L989 Disorder of the skin and subcutaneous tissue, unspecified: Secondary | ICD-10-CM | POA: Diagnosis not present

## 2022-06-15 DIAGNOSIS — B356 Tinea cruris: Secondary | ICD-10-CM | POA: Diagnosis not present

## 2022-06-15 DIAGNOSIS — E785 Hyperlipidemia, unspecified: Secondary | ICD-10-CM | POA: Diagnosis not present

## 2022-06-15 DIAGNOSIS — I890 Lymphedema, not elsewhere classified: Secondary | ICD-10-CM | POA: Diagnosis not present

## 2022-06-15 DIAGNOSIS — I272 Pulmonary hypertension, unspecified: Secondary | ICD-10-CM | POA: Diagnosis not present

## 2022-06-15 DIAGNOSIS — D61818 Other pancytopenia: Secondary | ICD-10-CM | POA: Diagnosis not present

## 2022-06-15 DIAGNOSIS — N189 Chronic kidney disease, unspecified: Secondary | ICD-10-CM | POA: Diagnosis not present

## 2022-06-15 DIAGNOSIS — M1990 Unspecified osteoarthritis, unspecified site: Secondary | ICD-10-CM | POA: Diagnosis not present

## 2022-06-15 DIAGNOSIS — E781 Pure hyperglyceridemia: Secondary | ICD-10-CM | POA: Diagnosis not present

## 2022-06-15 DIAGNOSIS — K219 Gastro-esophageal reflux disease without esophagitis: Secondary | ICD-10-CM | POA: Diagnosis not present

## 2022-06-15 DIAGNOSIS — M5416 Radiculopathy, lumbar region: Secondary | ICD-10-CM | POA: Diagnosis not present

## 2022-06-15 DIAGNOSIS — E538 Deficiency of other specified B group vitamins: Secondary | ICD-10-CM | POA: Diagnosis not present

## 2022-06-15 DIAGNOSIS — E46 Unspecified protein-calorie malnutrition: Secondary | ICD-10-CM | POA: Diagnosis not present

## 2022-06-15 DIAGNOSIS — L03115 Cellulitis of right lower limb: Secondary | ICD-10-CM | POA: Diagnosis not present

## 2022-06-15 DIAGNOSIS — M519 Unspecified thoracic, thoracolumbar and lumbosacral intervertebral disc disorder: Secondary | ICD-10-CM | POA: Diagnosis not present

## 2022-06-17 NOTE — Telephone Encounter (Signed)
Received call back from pt. Advised that her recent lab results did not show a clear reason for her anemia. Advised that Dr. Lorenso Courier recommends that she have a bone marrow biopsy done   Pt voiced understanding. She is aware that this has already been scheduled for 07/01/23. Advised that once the biopsy is done we will schedule a follow up appt for Dr. Lorenso Courier to discuss the results. No further questions or concerns at this time.

## 2022-06-18 ENCOUNTER — Telehealth: Payer: Self-pay | Admitting: Physician Assistant

## 2022-06-18 NOTE — Telephone Encounter (Signed)
Scheduled per 8/10 in basket, message has been left

## 2022-06-24 ENCOUNTER — Telehealth: Payer: Self-pay | Admitting: *Deleted

## 2022-06-24 NOTE — Telephone Encounter (Signed)
Received call from patient regarding her upcoming appts. Reviewed them in detail with her. She has a bone marrow biopsy on 07/01/22 and a follow up appt 1 week later on 07/13/22 to review those results. Explained them bone marrow biopsy to her. She voiced understanding. 

## 2022-06-30 ENCOUNTER — Other Ambulatory Visit: Payer: Self-pay | Admitting: Radiology

## 2022-06-30 DIAGNOSIS — D72819 Decreased white blood cell count, unspecified: Secondary | ICD-10-CM

## 2022-06-30 NOTE — H&P (Signed)
Chief Complaint: Patient was seen in consultation today for macrocytic anemia and leukopenia at the request of Dorsey,John T IV  Referring Physician(s): Dorsey,John T IV  Supervising Physician: Michaelle Birks  Patient Status: [facility] - Out-pt  History of Present Illness: Kathleen Wiley is a 76 y.o. female with medical history significant for microcytic anemia and leukopenia. Pt is followed by Dr. Lorenso Courier, oncology. Pt has been referred to IR for bone marrow biopsy to rule out multiple myeloma.   Past Medical History:  Diagnosis Date   Anxiety    Arthritis    knee, hip   Cellulitis 08/2016   left leg   Complication of anesthesia    Diabetes mellitus without complication (HCC)    GERD (gastroesophageal reflux disease)    Heart murmur    "slight "  informed years ago.-    Hernia, umbilical    History of atrial fibrillation 2007   with flagyl   History of endometriosis    Hypercholesteremia    Hypertension    has not been to a cardiologist   PONV (postoperative nausea and vomiting)    Rectal vaginal fistula    Refusal of blood transfusions as patient is Jehovah's Witness    Sleep apnea     Past Surgical History:  Procedure Laterality Date   APPENDECTOMY     BUNIONECTOMY Right 01/09/2019   Procedure: RIGHT FOOT BUNION CORRECTION;  Surgeon: Erle Crocker, MD;  Location: [place];  Service: Orthopedics;  Laterality: Right;   CHOLECYSTECTOMY N/A 03/16/2021   Procedure: LAPAROSCOPIC CHOLECYSTECTOMY;  Surgeon: Stark Klein, MD;  [facility];  Service: General;  Laterality: N/A;   ENDOSCOPIC RETROGRADE CHOLANGIOPANCREATOGRAPHY (ERCP) WITH PROPOFOL N/A 03/15/2021   Procedure: ENDOSCOPIC RETROGRADE CHOLANGIOPANCREATOGRAPHY (ERCP) WITH PROPOFOL;  Surgeon: Ronnette Juniper, MD;  [facility];  Service: Gastroenterology;  Laterality: N/A;   EXPLORATORY LAPAROTOMY  1973   for endometrosis   HAMMERTOE RECONSTRUCTION WITH WEIL OSTEOTOMY Right 01/09/2019   Procedure: RIGHT FOOT  2-4 HAMMERTOE CORRECTION WITH WEIL OSTEOTOMY 2ND METATARSAL, DEEP TENDON TRANSFER WITH AKIN OSTEOTOMY;  Surgeon: Erle Crocker, MD;  Location: [place];  Service: Orthopedics;  Laterality: Right;   I & D EXTREMITY Right 06/10/2016   Procedure: IRRIGATION AND DEBRIDEMENT THUMB FLEXOR SHEATH;  Surgeon: Leanora Cover, MD;  Location: [place];  Service: Orthopedics;  Laterality: Right;   I & D EXTREMITY Right 04/23/2022   Procedure: RIGHT LEG DEBRIDEMENT;  Surgeon: Newt Minion, MD;  Location: [place];  Service: Orthopedics;  Laterality: Right;   JOINT REPLACEMENT     Left Hip   MANDIBLE SURGERY  1978   rectovaginal fistula repair  1980   REMOVAL OF STONES  03/15/2021   Procedure: REMOVAL OF STONES;  Surgeon: Ronnette Juniper, MD;  Location[facility];  Service: Gastroenterology;;   Joan Mayans  03/15/2021   Procedure: Joan Mayans;  Surgeon: Ronnette Juniper, MD;  Location[facility];  Service: Gastroenterology;;   TOTAL HIP ARTHROPLASTY  05/08/2012   Procedure: TOTAL HIP ARTHROPLASTY;  Surgeon: Kerin Salen, MD;  Location: [place];  Service: Orthopedics;  Laterality: Left;   TOTAL HIP ARTHROPLASTY Right 09/18/2018   Procedure: TOTAL HIP ARTHROPLASTY ANTERIOR APPROACH;  Surgeon: Frederik Pear, MD;  [facility];  Service: Orthopedics;  Laterality: Right;   TOTAL KNEE ARTHROPLASTY Right 01/24/2013   Dr Mayer Camel   TOTAL KNEE ARTHROPLASTY Right 01/24/2013   Procedure: RIGHT TOTAL KNEE ARTHROPLASTY;  Surgeon: Kerin Salen, MD;  Location: [place];  Service: Orthopedics;  Laterality: Right;    Allergies: Other, Atorvastatin, Metronidazole, Oxybutynin, and Septra [sulfamethoxazole-trimethoprim]  Medications: Prior to Admission medications   Medication Sig Start Date End Date Taking? Authorizing Provider  acetaminophen (TYLENOL) 325 MG tablet Take 2 tablets (650 mg total) by mouth every 6 (six) hours as needed. Patient taking differently: Take 650 mg by mouth every 6 (six) hours as needed for mild pain.  03/17/21   Saverio Danker, PA-C  Cholecalciferol (VITAMIN D) 50 MCG (2000 UT) tablet Take 2,000 Units by mouth daily.     [provider]  cyanocobalamin 1000 MCG tablet Take 1,000 mcg by mouth daily.     [provider]  escitalopram (LEXAPRO) 20 MG tablet Take 20 mg by mouth daily.    [provider]  fluticasone (FLONASE) 50 MCG/ACT nasal spray Place 2 sprays into both nostrils daily as needed for allergies.  Patient not taking: Reported on 06/04/2022 07/02/19   [provider]  furosemide (LASIX) 20 MG tablet Take 20 mg by mouth daily. 09/23/19   [provider]  gabapentin (NEURONTIN) 300 MG capsule Take 300 mg by mouth 3 (three) times daily. 07/25/19   [provider]  metoprolol succinate (TOPROL XL) 25 MG 24 hr tablet Take 0.5 tablets (12.5 mg total) by mouth daily. 05/06/22   Geradine Girt, DO  omeprazole (PRILOSEC) 20 MG capsule Take 20 mg by mouth 2 (two) times daily before a meal.    [provider]  oxyCODONE (OXY IR/ROXICODONE) 5 MG immediate release tablet Take 1 tablet (5 mg total) by mouth every 4 (four) hours as needed for severe pain or moderate pain. 05/06/22   Geradine Girt, DO  OZEMPIC, 1 MG/DOSE, 4 MG/3ML SOPN Inject 2 mg into the skin once a week. Patient not taking: Reported on 06/04/2022 06/19/21   [provider]  polyethylene glycol powder (GLYCOLAX/MIRALAX) 17 GM/SCOOP powder Take 17 g by mouth daily as needed for moderate constipation. 04/27/22   Thurnell Lose, MD  potassium chloride (KLOR-CON) 10 MEQ tablet Take 10 mEq by mouth daily.    [provider]  rosuvastatin (CRESTOR) 5 MG tablet Take 5 mg by mouth at bedtime.    [provider]     Family History  Problem Relation Age of Onset   Dementia Mother    Cancer Father    Breast cancer Neg Hx     Social History   Socioeconomic History   Marital status: Married    Spouse name: Not on file   Number of children: 1    Years of education: Not on file   Highest education level: Not on file  Occupational History   Not on file  Tobacco Use   Smoking status: Former    Packs/day: 1.00    Years: 10.00    Total pack years: 10.00    Types: Cigarettes    Quit date: 11/08/1981    Years since quitting: 40.6   Smokeless tobacco: Never  Vaping Use   Vaping Use: Never used  Substance and Sexual Activity   Alcohol use: No   Drug use: No   Sexual activity: Not on file  Other Topics Concern   Not on file  Social History Narrative   Not on file   Social Determinants of Health   Financial Resource Strain: Not on file  Food Insecurity: Not on file  Transportation Needs: Not on file  Physical Activity: Not on file  Stress: Not on file  Social Connections: Not  on file   Review of Systems: A 12 point ROS discussed and pertinent positives are indicated in the HPI above.  All other systems are negative.  Review of Systems  All other systems reviewed and are negative.   Vital Signs: BP 127/69   Pulse 71   Temp 98.2 F (36.8 C) (Oral)   Resp 14   Ht '5\' 6"'  (1.676 m)   Wt 225 lb (102.1 kg)   SpO2 92%   BMI 36.32 kg/m     Physical Exam Vitals reviewed.  Constitutional:      General: She is not in acute distress.    Appearance: Normal appearance. She is not ill-appearing.  HENT:     Head: Normocephalic and atraumatic.     Mouth/Throat:     Mouth: Mucous membranes are dry.     Pharynx: Oropharynx is clear.  Eyes:     Extraocular Movements: Extraocular movements intact.     Pupils: Pupils are equal, round, and reactive to light.  Cardiovascular:     Rate and Rhythm: Normal rate and regular rhythm.     Pulses: Normal pulses.     Heart sounds: Normal heart sounds. No murmur heard. Pulmonary:     Effort: Pulmonary effort is normal. No respiratory distress.     Breath sounds: Normal breath sounds.  Abdominal:     General: Bowel sounds are normal. There is no distension.     Palpations: Abdomen  is soft.     Tenderness: There is no abdominal tenderness. There is no guarding.  Musculoskeletal:     Right lower leg: No edema.     Left lower leg: No edema.  Skin:    General: Skin is warm and dry.  Neurological:     Mental Status: She is alert and oriented to person, place, and time.  Psychiatric:        Mood and Affect: Mood normal.        Behavior: Behavior normal.        Thought Content: Thought content normal.        Judgment: Judgment normal.     Imaging: No results found.  Labs:  CBC: Recent Labs    05/05/22 0301 05/06/22 0343 06/04/22 1402 07/01/22 0940  WBC 2.4* 1.9* 2.5* 2.9*  HGB 8.4* 8.8* 10.6* 11.3*  HCT 27.1* 27.5* 31.9* 34.3*  PLT 210 187 156 144*    COAGS: Recent Labs    04/19/22 2033 04/23/22 0108  INR 1.2 1.1  APTT 27  --     BMP: Recent Labs    05/04/22 0324 05/05/22 0301 05/06/22 0343 06/04/22 1402  NA 138 138 138 139  K 4.2 3.9 3.8 4.2  CL 105 109 108 105  CO2 22 21* 20* 27  GLUCOSE 101* 113* 112* 115*  BUN '13 14 14 13  ' CALCIUM 8.0* 8.0* 7.9* 9.0  CREATININE 0.83 0.76 0.76 0.65  GFRNONAA >60 >60 >60 >60    LIVER FUNCTION TESTS: Recent Labs    04/26/22 0232 05/03/22 1831 05/04/22 0324 06/04/22 1402  BILITOT 1.1 1.0 1.2 0.8  AST 14* '21 19 21  ' ALT '13 13 11 14  ' ALKPHOS 95 87 68 71  PROT 4.9* 6.5 5.3* 7.0  ALBUMIN 1.8* 2.4* 2.1* 4.0    TUMOR MARKERS: No results for input(s): "AFPTM", "CEA", "CA199", "CHROMGRNA" in the last 8760 hours.  Assessment and Plan: History of microcytic anemia and leukopenia. Pt is followed by Dr. Lorenso Courier, oncology. Pt has been referred to IR for  bone marrow biopsy to rule out multiple myeloma.   Pt resting on stretcher. She is A&O, calm and pleasant.  She is in no distress.  Pt is NPO per order.   Risks and benefits of bone marrow biopsy and aspiration with moderate sedation was discussed with the patient and/or patient's family including, but not limited to bleeding, infection, damage  to adjacent structures or low yield requiring additional tests.  All of the questions were answered and there is agreement to proceed.  Consent signed and in chart.   Thank you for this interesting consult.  I greatly enjoyed meeting Kathleen Wiley and look forward to participating in their care.  A copy of this report was sent to the requesting provider on this date.  Electronically Signed: Tyson Alias, NP 07/01/2022, 10:20 AM   I spent a total of 20 minutes in face to face in clinical consultation, greater than 50% of which was counseling/coordinating care for microcytic anemia, leukopenia.

## 2022-07-01 ENCOUNTER — Ambulatory Visit (HOSPITAL_COMMUNITY)
Admission: RE | Admit: 2022-07-01 | Discharge: 2022-07-01 | Disposition: A | Discharge status: Home / Self Care | Payer: Medicare Other | Source: Ambulatory Visit | Attending: Hematology and Oncology | Admitting: Hematology and Oncology

## 2022-07-01 ENCOUNTER — Encounter (HOSPITAL_COMMUNITY): Payer: Self-pay

## 2022-07-01 DIAGNOSIS — D72819 Decreased white blood cell count, unspecified: Secondary | ICD-10-CM | POA: Diagnosis not present

## 2022-07-01 DIAGNOSIS — D539 Nutritional anemia, unspecified: Secondary | ICD-10-CM | POA: Diagnosis not present

## 2022-07-01 DIAGNOSIS — Z1379 Encounter for other screening for genetic and chromosomal anomalies: Secondary | ICD-10-CM | POA: Insufficient documentation

## 2022-07-01 DIAGNOSIS — D7589 Other specified diseases of blood and blood-forming organs: Secondary | ICD-10-CM | POA: Diagnosis not present

## 2022-07-01 DIAGNOSIS — D649 Anemia, unspecified: Secondary | ICD-10-CM | POA: Diagnosis not present

## 2022-07-01 DIAGNOSIS — D61818 Other pancytopenia: Secondary | ICD-10-CM | POA: Diagnosis not present

## 2022-07-01 DIAGNOSIS — E119 Type 2 diabetes mellitus without complications: Secondary | ICD-10-CM | POA: Insufficient documentation

## 2022-07-01 LAB — CBC WITH DIFFERENTIAL/PLATELET
Abs Immature Granulocytes: 0.05 10*3/uL (ref 0.00–0.07)
Basophils Absolute: 0.1 10*3/uL (ref 0.0–0.1)
Basophils Relative: 2 %
Eosinophils Absolute: 0.1 10*3/uL (ref 0.0–0.5)
Eosinophils Relative: 4 %
HCT: 34.3 % — ABNORMAL LOW (ref 36.0–46.0)
Hemoglobin: 11.3 g/dL — ABNORMAL LOW (ref 12.0–15.0)
Immature Granulocytes: 2 %
Lymphocytes Relative: 36 %
Lymphs Abs: 1 10*3/uL (ref 0.7–4.0)
MCH: 33.3 pg (ref 26.0–34.0)
MCHC: 32.9 g/dL (ref 30.0–36.0)
MCV: 101.2 fL — ABNORMAL HIGH (ref 80.0–100.0)
Monocytes Absolute: 0.2 10*3/uL (ref 0.1–1.0)
Monocytes Relative: 7 %
Neutro Abs: 1.4 10*3/uL — ABNORMAL LOW (ref 1.7–7.7)
Neutrophils Relative %: 49 %
Platelets: 144 10*3/uL — ABNORMAL LOW (ref 150–400)
RBC: 3.39 MIL/uL — ABNORMAL LOW (ref 3.87–5.11)
RDW: 14.4 % (ref 11.5–15.5)
WBC: 2.9 10*3/uL — ABNORMAL LOW (ref 4.0–10.5)
nRBC: 0 % (ref 0.0–0.2)

## 2022-07-01 LAB — GLUCOSE, CAPILLARY: Glucose-Capillary: 110 mg/dL — ABNORMAL HIGH (ref 70–99)

## 2022-07-01 LAB — NO BLOOD PRODUCTS

## 2022-07-01 MED ORDER — MIDAZOLAM HCL 2 MG/2ML IJ SOLN
INTRAMUSCULAR | Status: AC
  Filled 2022-07-01: qty 4

## 2022-07-01 MED ORDER — FENTANYL CITRATE (PF) 100 MCG/2ML IJ SOLN
INTRAMUSCULAR | Status: AC
  Filled 2022-07-01: qty 4

## 2022-07-01 MED ORDER — FENTANYL CITRATE (PF) 100 MCG/2ML IJ SOLN
INTRAMUSCULAR | Status: AC | PRN
  Administered 2022-07-01 (×2): 25 ug via INTRAVENOUS
  Administered 2022-07-01: 50 ug via INTRAVENOUS

## 2022-07-01 MED ORDER — SODIUM CHLORIDE 0.9 % IV SOLN: INTRAVENOUS | Status: DC

## 2022-07-01 MED ORDER — MIDAZOLAM HCL 2 MG/2ML IJ SOLN
INTRAMUSCULAR | Status: AC | PRN
  Administered 2022-07-01: 1 mg via INTRAVENOUS
  Administered 2022-07-01 (×2): .5 mg via INTRAVENOUS

## 2022-07-01 NOTE — Procedures (Signed)
Vascular and Interventional Radiology Procedure Note  Patient: Kathleen Wiley DOB: 1946/09/07 Medical Record Number: [id number] Note Date/Time: 07/01/22 11:53 AM   Performing Physician: Michaelle Birks, MD Assistant(s): None  Diagnosis: Anemia  Procedure: BONE MARROW ASPIRATION and BIOPSY  Anesthesia: Conscious Sedation Complications: None Estimated Blood Loss: Minimal Specimens: Sent for Pathology  Findings:  Successful CT-guided bone marrow aspiration and biopsy A total of 1 cores were obtained. Hemostasis of the tract was achieved using Manual Pressure.  Plan: Bed rest for 1 hours.  See detailed procedure note with images in PACS. The patient tolerated the procedure well without incident or complication and was returned to Recovery in stable condition.    Michaelle Birks, MD [facility] Radiology   Pager. [place]

## 2022-07-01 NOTE — Discharge Instructions (Addendum)
Urgent needs - Interventional Radiology on call MD [phone]  Wound - May remove dressing and shower in tomorrow.  Keep site clean and dry.  Replace with bandaid as needed.  Do not submerge in tub or water until site healing well.     Moderate Conscious Sedation, Adult, Care After This sheet gives you information about how to care for yourself after your procedure. Your health care provider may also give you more specific instructions. If you have problems or questions, contact your health care provider. What can I expect after the procedure? After the procedure, it is common to have: Sleepiness for several hours. Impaired judgment for several hours. Difficulty with balance. Vomiting if you eat too soon. Follow these instructions at home: For the time period you were told by your health care provider:     Rest. Do not participate in activities where you could fall or become injured. Do not drive or use machinery. Do not drink alcohol. Do not take sleeping pills or medicines that cause drowsiness. Do not make important decisions or sign legal documents. Do not take care of children on your own. Eating and drinking  Follow the diet recommended by your health care provider. Drink enough fluid to keep your urine pale yellow. If you vomit: Drink water, juice, or soup when you can drink without vomiting. Make sure you have little or no nausea before eating solid foods. General instructions Take over-the-counter and prescription medicines only as told by your health care provider. Have a responsible adult stay with you for the time you are told. It is important to have someone help care for you until you are awake and alert. Do not smoke. Keep all follow-up visits as told by your health care provider. This is important. Contact a health care provider if: You are still sleepy or having trouble with balance after 24 hours. You feel light-headed. You keep feeling nauseous or you keep  vomiting. You develop a rash. You have a fever. You have redness or swelling around the IV site. Get help right away if: You have trouble breathing. You have new-onset confusion at home. Summary After the procedure, it is common to feel sleepy, have impaired judgment, or feel nauseous if you eat too soon. Rest after you get home. Know the things you should not do after the procedure. Follow the diet recommended by your health care provider and drink enough fluid to keep your urine pale yellow. Get help right away if you have trouble breathing or new-onset confusion at home. This information is not intended to replace advice given to you by your health care provider. Make sure you discuss any questions you have with your health care provider. Document Revised: 02/22/2020 Document Reviewed: 09/20/2019 Elsevier Patient Education  [place].     Bone Marrow Aspiration and Bone Marrow Biopsy, Adult, Care After This sheet gives you information about how to care for yourself after your procedure. Your health care provider may also give you more specific instructions. If you have problems or questions, contact your health careprovider. What can I expect after the procedure? After the procedure, it is common to have: Mild pain and tenderness. Swelling. Bruising. Follow these instructions at home: Puncture site care Follow instructions from your health care provider about how to take care of the puncture site. Make sure you: Wash your hands with soap and water before and after you change your bandage (dressing). If soap and water are not available, use hand sanitizer. Change your  dressing as told by your health care provider. Check your puncture site every day for signs of infection. Check for: More redness, swelling, or pain. Fluid or blood. Warmth. Pus or a bad smell.  Activity Return to your normal activities as told by your health care provider. Ask your health care provider  what activities are safe for you. Do not lift anything that is heavier than 10 lb (4.5 kg), or the limit that you are told, until your health care provider says that it is safe. Do not drive for 24 hours if you were given a sedative during your procedure. General instructions Take over-the-counter and prescription medicines only as told by your health care provider. Do not take baths, swim, or use a hot tub until your health care provider approves. Ask your health care provider if you may take showers. You may only be allowed to take sponge baths. If directed, put ice on the affected area. To do this: Put ice in a plastic bag. Place a towel between your skin and the bag. Leave the ice on for 20 minutes, 2-3 times a day. Keep all follow-up visits as told by your health care provider. This is important.  Contact a health care provider if: Your pain is not controlled with medicine. You have a fever. You have more redness, swelling, or pain around the puncture site. You have fluid or blood coming from the puncture site. Your puncture site feels warm to the touch. You have pus or a bad smell coming from the puncture site. Summary After the procedure, it is common to have mild pain, tenderness, swelling, and bruising. Follow instructions from your health care provider about how to take care of the puncture site and what activities are safe for you. Take over-the-counter and prescription medicines only as told by your health care provider. Contact a health care provider if you have any signs of infection, such as fluid or blood coming from the puncture site. This information is not intended to replace advice given to you by your health care provider. Make sure you discuss any questions you have with your healthcare provider. Document Revised: 03/13/2019 Document Reviewed: 03/13/2019 Elsevier Patient Education  2022 [place]. For questions /concerns may call Interventional Radiology at  [phone] or  Interventional Radiology clinic [phone]   You may remove your dressing and shower tomorrow afternoon  DO NOT use EMLA cream for 2 weeks after port placement as the cream will remove surgical glue on your incision.

## 2022-07-06 ENCOUNTER — Ambulatory Visit: Payer: Medicare Other | Admitting: Orthopedic Surgery

## 2022-07-06 DIAGNOSIS — L97915 Non-pressure chronic ulcer of unspecified part of right lower leg with muscle involvement without evidence of necrosis: Secondary | ICD-10-CM

## 2022-07-06 LAB — SURGICAL PATHOLOGY

## 2022-07-09 ENCOUNTER — Encounter (HOSPITAL_COMMUNITY): Payer: Self-pay | Admitting: Hematology and Oncology

## 2022-07-12 ENCOUNTER — Other Ambulatory Visit: Payer: Self-pay | Admitting: Physician Assistant

## 2022-07-12 DIAGNOSIS — D539 Nutritional anemia, unspecified: Secondary | ICD-10-CM

## 2022-07-13 ENCOUNTER — Other Ambulatory Visit: Payer: Self-pay

## 2022-07-13 ENCOUNTER — Inpatient Hospital Stay (HOSPITAL_BASED_OUTPATIENT_CLINIC_OR_DEPARTMENT_OTHER): Payer: Medicare Other | Admitting: Physician Assistant

## 2022-07-13 ENCOUNTER — Inpatient Hospital Stay: Payer: Medicare Other | Attending: Hematology and Oncology

## 2022-07-13 VITALS — BP 127/76 | HR 74 | Temp 98.1°F | Wt 225.1 lb

## 2022-07-13 DIAGNOSIS — D46Z Other myelodysplastic syndromes: Secondary | ICD-10-CM | POA: Diagnosis not present

## 2022-07-13 DIAGNOSIS — Z87891 Personal history of nicotine dependence: Secondary | ICD-10-CM | POA: Diagnosis not present

## 2022-07-13 DIAGNOSIS — Z79899 Other long term (current) drug therapy: Secondary | ICD-10-CM | POA: Diagnosis not present

## 2022-07-13 DIAGNOSIS — D46A Refractory cytopenia with multilineage dysplasia: Secondary | ICD-10-CM | POA: Insufficient documentation

## 2022-07-13 DIAGNOSIS — D539 Nutritional anemia, unspecified: Secondary | ICD-10-CM

## 2022-07-13 LAB — CBC WITH DIFFERENTIAL (CANCER CENTER ONLY)
Abs Immature Granulocytes: 0.05 10*3/uL (ref 0.00–0.07)
Basophils Absolute: 0 10*3/uL (ref 0.0–0.1)
Basophils Relative: 1 %
Eosinophils Absolute: 0.2 10*3/uL (ref 0.0–0.5)
Eosinophils Relative: 5 %
HCT: 36.5 % (ref 36.0–46.0)
Hemoglobin: 12.5 g/dL (ref 12.0–15.0)
Immature Granulocytes: 2 %
Lymphocytes Relative: 39 %
Lymphs Abs: 1.3 10*3/uL (ref 0.7–4.0)
MCH: 33.2 pg (ref 26.0–34.0)
MCHC: 34.2 g/dL (ref 30.0–36.0)
MCV: 97.1 fL (ref 80.0–100.0)
Monocytes Absolute: 0.2 10*3/uL (ref 0.1–1.0)
Monocytes Relative: 7 %
Neutro Abs: 1.5 10*3/uL — ABNORMAL LOW (ref 1.7–7.7)
Neutrophils Relative %: 46 %
Platelet Count: 153 10*3/uL (ref 150–400)
RBC: 3.76 MIL/uL — ABNORMAL LOW (ref 3.87–5.11)
RDW: 13.6 % (ref 11.5–15.5)
WBC Count: 3.3 10*3/uL — ABNORMAL LOW (ref 4.0–10.5)
nRBC: 0 % (ref 0.0–0.2)

## 2022-07-13 LAB — CMP (CANCER CENTER ONLY)
ALT: 29 U/L (ref 0–44)
AST: 28 U/L (ref 15–41)
Albumin: 4.4 g/dL (ref 3.5–5.0)
Alkaline Phosphatase: 73 U/L (ref 38–126)
Anion gap: 7 (ref 5–15)
BUN: 16 mg/dL (ref 8–23)
CO2: 27 mmol/L (ref 22–32)
Calcium: 9.6 mg/dL (ref 8.9–10.3)
Chloride: 105 mmol/L (ref 98–111)
Creatinine: 0.76 mg/dL (ref 0.44–1.00)
GFR, Estimated: >60 mL/min (ref >60)
Glucose, Bld: 132 mg/dL — ABNORMAL HIGH (ref 70–99)
Potassium: 4.1 mmol/L (ref 3.5–5.1)
Sodium: 139 mmol/L (ref 135–145)
Total Bilirubin: 0.8 mg/dL (ref 0.3–1.2)
Total Protein: 7.2 g/dL (ref 6.5–8.1)

## 2022-07-13 NOTE — Progress Notes (Signed)
[place] [phone]   [phone]  PROGRESS NOTE  Patient Care Team: Jonathon Jordan, MD as PCP - General (Family Medicine) Stanford Breed Denice Bors, MD as PCP - Cardiology (Cardiology)  CHIEF COMPLAINTS/PURPOSE OF CONSULTATION:  Myelodysplastic syndrome with multilineage dysplasia.   HISTORY OF PRESENTING ILLNESS:  Kathleen Wiley 76 y.o. female returns for a follow up for newly diagnosed MDS. She is unaccompanied for this visit.   On exam today, [name] reports that her energy levels are at baseline. She continues to complete her ADLs on her own. She denies any dietary changes or weight loss. She denies nausea, vomiting or abdominal pain. She denies any diarrhea or constipation. She denies easy bruising or active bleeding. She continues to have lower leg wounds that she wraps daily. She denies fevers, chills, night sweat, shortness of breath, chest pain or cough. She has no other complaints. A full 10 point ROS is listed below.  MEDICAL HISTORY:  Past Medical History:  Diagnosis Date   Anxiety    Arthritis    knee, hip   Cellulitis 08/2016   left leg   Complication of anesthesia    Diabetes mellitus without complication (HCC)    GERD (gastroesophageal reflux disease)    Heart murmur    "slight "  informed years ago.-    Hernia, umbilical    History of atrial fibrillation 2007   with flagyl   History of endometriosis    Hypercholesteremia    Hypertension    has not been to a cardiologist   PONV (postoperative nausea and vomiting)    Rectal vaginal fistula    Refusal of blood transfusions as patient is Jehovah's Witness    Sleep apnea     SURGICAL HISTORY: Past Surgical History:  Procedure Laterality Date   APPENDECTOMY     BUNIONECTOMY Right 01/09/2019   Procedure: RIGHT FOOT BUNION CORRECTION;  Surgeon: Erle Crocker, MD;  Location: [facility];  Service: Orthopedics;  Laterality: Right;   CHOLECYSTECTOMY N/A 03/16/2021   Procedure:  LAPAROSCOPIC CHOLECYSTECTOMY;  Surgeon: Stark Klein, MD;  Location: [facility];  Service: General;  Laterality: N/A;   ENDOSCOPIC RETROGRADE CHOLANGIOPANCREATOGRAPHY (ERCP) WITH PROPOFOL N/A 03/15/2021   Procedure: ENDOSCOPIC RETROGRADE CHOLANGIOPANCREATOGRAPHY (ERCP) WITH PROPOFOL;  Surgeon: Ronnette Juniper, MD;  Location: [facility];  Service: Gastroenterology;  Laterality: N/A;   EXPLORATORY LAPAROTOMY  1973   for endometrosis   HAMMERTOE RECONSTRUCTION WITH WEIL OSTEOTOMY Right 01/09/2019   Procedure: RIGHT FOOT 2-4 HAMMERTOE CORRECTION WITH WEIL OSTEOTOMY 2ND METATARSAL, DEEP TENDON TRANSFER WITH AKIN OSTEOTOMY;  Surgeon: Erle Crocker, MD;  Location: [place];  Service: Orthopedics;  Laterality: Right;   I & D EXTREMITY Right 06/10/2016   Procedure: IRRIGATION AND DEBRIDEMENT THUMB FLEXOR SHEATH;  Surgeon: Leanora Cover, MD;  Location: [place];  Service: Orthopedics;  Laterality: Right;   I & D EXTREMITY Right 04/23/2022   Procedure: RIGHT LEG DEBRIDEMENT;  Surgeon: Newt Minion, MD;  Location: [place];  Service: Orthopedics;  Laterality: Right;   JOINT REPLACEMENT     Left Hip   MANDIBLE SURGERY  1978   rectovaginal fistula repair  1980   REMOVAL OF STONES  03/15/2021   Procedure: REMOVAL OF STONES;  Surgeon: Ronnette Juniper, MD;  Location: [facility];  Service: Gastroenterology;;   Joan Mayans  03/15/2021   Procedure: Joan Mayans;  Surgeon: Ronnette Juniper, MD;  Location: [facility];  Service: Gastroenterology;;   TOTAL HIP ARTHROPLASTY  05/08/2012   Procedure: TOTAL HIP ARTHROPLASTY;  Surgeon: Kerin Salen, MD;  Location: [place];  Service: Orthopedics;  Laterality: Left;   TOTAL HIP ARTHROPLASTY Right 09/18/2018   Procedure: TOTAL HIP ARTHROPLASTY ANTERIOR APPROACH;  Surgeon: Frederik Pear, MD;  Location: [facility];  Service: Orthopedics;  Laterality: Right;   TOTAL KNEE ARTHROPLASTY Right 01/24/2013   Dr Mayer Camel   TOTAL KNEE ARTHROPLASTY Right 01/24/2013   Procedure: RIGHT TOTAL KNEE ARTHROPLASTY;   Surgeon: Kerin Salen, MD;  Location: [place] [place];  Service: Orthopedics;  Laterality: Right;    SOCIAL HISTORY: Social History   Socioeconomic History   Marital status: Married    Spouse name: Not on file   Number of children: 1   Years of education: Not on file   Highest education level: Not on file  Occupational History   Not on file  Tobacco Use   Smoking status: Former    Packs/day: 1.00    Years: 10.00    Total pack years: 10.00    Types: Cigarettes    Quit date: 11/08/1981    Years since quitting: 40.7   Smokeless tobacco: Never  Vaping Use   Vaping Use: Never used  Substance and Sexual Activity   Alcohol use: No   Drug use: No   Sexual activity: Not on file  Other Topics Concern   Not on file  Social History Narrative   Not on file   Social Determinants of Health   Financial Resource Strain: Not on file  Food Insecurity: Not on file  Transportation Needs: Not on file  Physical Activity: Not on file  Stress: Not on file  Social Connections: Not on file  Intimate Partner Violence: Not on file    FAMILY HISTORY: Family History  Problem Relation Age of Onset   Dementia Mother    Cancer Father    Breast cancer Neg Hx     ALLERGIES:  is allergic to other, atorvastatin, metronidazole, oxybutynin, and septra [sulfamethoxazole-trimethoprim].  MEDICATIONS:  Current Outpatient Medications  Medication Sig Dispense Refill   bismuth subsalicylate (PEPTO BISMOL) 262 MG/15ML suspension Take 30 mLs by mouth every 6 (six) hours as needed.     Cholecalciferol (VITAMIN D) 50 MCG (2000 UT) tablet Take 2,000 Units by mouth daily.      cyanocobalamin 1000 MCG tablet Take 1,000 mcg by mouth daily.      escitalopram (LEXAPRO) 20 MG tablet Take 20 mg by mouth daily.     fluticasone (FLONASE) 50 MCG/ACT nasal spray Place 2 sprays into both nostrils daily as needed for allergies.     furosemide (LASIX) 20 MG tablet Take 20 mg by mouth daily.     gabapentin (NEURONTIN) 300 MG  capsule Take 300 mg by mouth 3 (three) times daily.     ibuprofen (ADVIL) 200 MG tablet Take 400 mg by mouth 2 (two) times daily.     metoprolol succinate (TOPROL XL) 25 MG 24 hr tablet Take 0.5 tablets (12.5 mg total) by mouth daily.     omeprazole (PRILOSEC) 20 MG capsule Take 20 mg by mouth 2 (two) times daily before a meal.     OZEMPIC, 1 MG/DOSE, 4 MG/3ML SOPN Inject 2 mg into the skin once a week.     potassium chloride (KLOR-CON) 10 MEQ tablet Take 10 mEq by mouth daily.     rosuvastatin (CRESTOR) 5 MG tablet Take 5 mg by mouth at bedtime.     acetaminophen (TYLENOL) 325 MG tablet Take 2 tablets (650 mg total) by mouth every 6 (six)  hours as needed. (Patient not taking: Reported on 07/13/2022)     oxyCODONE (OXY IR/ROXICODONE) 5 MG immediate release tablet Take 1 tablet (5 mg total) by mouth every 4 (four) hours as needed for severe pain or moderate pain. (Patient not taking: Reported on 07/13/2022) 5 tablet 0   polyethylene glycol powder (GLYCOLAX/MIRALAX) 17 GM/SCOOP powder Take 17 g by mouth daily as needed for moderate constipation. (Patient not taking: Reported on 07/13/2022) 238 g 0   No current facility-administered medications for this visit.    REVIEW OF SYSTEMS:   Constitutional: ( - ) fevers, ( - )  chills , ( - ) night sweats Eyes: ( - ) blurriness of vision, ( - ) double vision, ( - ) watery eyes Ears, nose, mouth, throat, and face: ( - ) mucositis, ( - ) sore throat Respiratory: ( - ) cough, ( - ) dyspnea, ( - ) wheezes Cardiovascular: ( - ) palpitation, ( - ) chest discomfort, ( - ) lower extremity swelling Gastrointestinal:  ( - ) nausea, ( - ) heartburn, ( - ) change in bowel habits Skin: ( - ) abnormal skin rashes Lymphatics: ( - ) new lymphadenopathy, ( - ) easy bruising Neurological: ( - ) numbness, ( - ) tingling, ( - ) new weaknesses Behavioral/Psych: ( - ) mood change, ( - ) new changes  All other systems were reviewed with the patient and are negative.  PHYSICAL  EXAMINATION:  Vitals:   07/13/22 1457  BP: 127/76  Pulse: 74  Temp: 98.1 F (36.7 C)  SpO2: 98%   Filed Weights   07/13/22 1457  Weight: 225 lb 1.6 oz (102.1 kg)    GENERAL: well appearing elderly Caucasian female in NAD  SKIN: skin color, texture, turgor are normal, no rashes or significant lesions EYES: conjunctiva are pink and non-injected, sclera clear LUNGS: clear to auscultation and percussion with normal breathing effort HEART: regular rate & rhythm and no murmurs. Bilateral lower extremity edema.  Musculoskeletal: no cyanosis of digits and no clubbing  PSYCH: alert & oriented x 3, fluent speech NEURO: no focal motor/sensory deficits  LABORATORY DATA:  I have reviewed the data as listed    Latest Ref Rng & Units 07/13/2022    2:33 PM 07/01/2022    9:40 AM 06/04/2022    2:02 PM  CBC  WBC 4.0 - 10.5 K/uL 3.3  2.9  2.5   Hemoglobin 12.0 - 15.0 g/dL 12.5  11.3  10.6   Hematocrit 36.0 - 46.0 % 36.5  34.3  31.9   Platelets 150 - 400 K/uL 153  144  156        Latest Ref Rng & Units 06/04/2022    2:02 PM 05/06/2022    3:43 AM 05/05/2022    3:01 AM  CMP  Glucose 70 - 99 mg/dL 115  112  113   BUN 8 - 23 mg/dL _0 Creatinine 0.44 - 1.00 mg/dL 0.65  0.76  0.76   Sodium 135 - 145 mmol/L 139  138  138   Potassium 3.5 - 5.1 mmol/L 4.2  3.8  3.9   Chloride 98 - 111 mmol/L 105  108  109   CO2 22 - 32 mmol/L _1 Calcium 8.9 - 10.3 mg/dL 9.0  7.9  8.0   Total Protein 6.5 - 8.1 g/dL 7.0     Total Bilirubin 0.3 - 1.2 mg/dL 0.8  Alkaline Phos 38 - 126 U/L 71     AST 15 - 41 U/L 21     ALT 0 - 44 U/L 14        ASSESSMENT & PLAN Maceo Pro 76 y.o. female with medical history significant for  newly diagnosed myelodysplastic syndrome.   #Myelodysplastic syndrome-low grade: --Bone marrow biopsy form 07/01/2022 showed hypercellular bone marrow (75%) with erythroid and megakaryocytic dysplasia consistent with myelodysplastic syndrome with multilineage  dysplasia  --Labs today show anemia and thrombocytopenia have resolved with Hgb 12.5, Plt 153K. Improving neutropenia with ANC 1.5. --No indication to start therapy at this time --Recommend to monitor at this time. --Return in 6 months with labs  and follow up visit with Dr. Lorenso Courier  No orders of the defined types were placed in this encounter.   All questions were answered. The patient knows to call the clinic with any problems, questions or concerns.  I have spent a total of 30 minutes minutes of face-to-face and non-face-to-face time, preparing to see the [place] a medically appropriate examination, counseling and educating the patient,documenting clinical information in the electronic health record,  and care coordination.   Dede Query PA-C Dept of Hematology and [place] at [facility] Phone: [phone]   07/13/2022 3:09 PM

## 2022-07-14 ENCOUNTER — Telehealth: Payer: Self-pay | Admitting: Hematology and Oncology

## 2022-07-14 NOTE — Telephone Encounter (Signed)
Per 9/5 los called and spoke to pt about appointment

## 2022-07-21 ENCOUNTER — Encounter: Payer: Self-pay | Admitting: Orthopedic Surgery

## 2022-07-21 NOTE — Progress Notes (Signed)
Office Visit Note   Patient: Kathleen Wiley           Date of Birth: [birth year]           MRN: [id number] Visit Date: 07/06/2022              Requested by: Jonathon Jordan, MD [address] [place],  [place] [address] PCP: Jonathon Jordan, MD  Chief Complaint  Patient presents with   Right Leg - Routine Post Op    04/23/2022 right leg deb of abscess       HPI: Patient is a 76 year old woman who presents in follow-up for debridement right leg abscess 2 months ago.  Assessment & Plan: Visit Diagnoses:  1. Ulcer of right lower extremity with muscle involvement without evidence of necrosis ([place])     Plan: Patient has the eschar removed resume compression socks.  Follow-Up Instructions: Return in about 4 weeks (around 08/03/2022).   Ortho Exam  Patient is alert, oriented, no adenopathy, well-dressed, normal affect, normal respiratory effort. Examination patient has 3 eschars that are approximately 10 cm in diameter these were removed.  There is no cellulitis no drainage.  Imaging: No results found. No images are attached to the encounter.  Labs: Lab Results  Component Value Date   HGBA1C 5.2 04/20/2022   HGBA1C 6.4 (H) 03/15/2021   HGBA1C 7.2 (H) 10/26/2019   CRP 14.6 (H) 04/26/2022   CRP 12.3 (H) 04/25/2022   CRP 16.0 (H) 04/24/2022   REPTSTATUS 05/08/2022 FINAL 05/03/2022   GRAMSTAIN NO WBC SEEN NO ORGANISMS SEEN  04/23/2022   CULT  05/03/2022    NO GROWTH 5 DAYS Performed at [facility], [place] [address], [place], [place] [address]    LABORGA KLEBSIELLA PNEUMONIAE (A) 04/19/2022     Lab Results  Component Value Date   ALBUMIN 4.4 07/13/2022   ALBUMIN 4.0 06/04/2022   ALBUMIN 2.1 (L) 05/04/2022   PREALBUMIN 14.4 (L) 05/04/2022    Lab Results  Component Value Date   MG 2.2 05/03/2022   MG 1.9 04/26/2022   MG 1.8 04/25/2022   No results found for: "VD25OH"  Lab Results  Component Value Date   PREALBUMIN 14.4 (L) 05/04/2022       Latest Ref Rng & Units 07/13/2022    2:33 PM 07/01/2022    9:40 AM 06/04/2022    2:02 PM  CBC EXTENDED  WBC 4.0 - 10.5 K/uL 3.3  2.9  2.5   RBC 3.87 - 5.11 MIL/uL 3.76  3.39  3.20    3.15   Hemoglobin 12.0 - 15.0 g/dL 12.5  11.3  10.6   HCT 36.0 - 46.0 % 36.5  34.3  31.9   Platelets 150 - 400 K/uL 153  144  156   NEUT# 1.7 - 7.7 K/uL 1.5  1.4  1.1   Lymph# 0.7 - 4.0 K/uL 1.3  1.0  1.1      There is no height or weight on file to calculate BMI.  Orders:  No orders of the defined types were placed in this encounter.  No orders of the defined types were placed in this encounter.    Procedures: No procedures performed  Clinical Data: No additional findings.  ROS:  All other systems negative, except as noted in the HPI. Review of Systems  Objective: Vital Signs: There were no vitals taken for this visit.  Specialty Comments:  No specialty comments available.  PMFS History: Patient Active Problem List  Diagnosis Date Noted   MDS (myelodysplastic syndrome), low grade ([place]) 07/13/2022   Prolonged QT interval 05/04/2022   Elevated TSH 05/04/2022   Abnormal ECG 05/04/2022   Cellulitis 17/61/6073   Diastolic dysfunction with chronic heart failure ([place]) 05/03/2022   Sinus bradycardia 05/03/2022   Group G streptococcal infection 04/21/2022   Sepsis ([place]) 04/20/2022   Cellulitis of right lower extremity 04/20/2022   Gallstone pancreatitis 03/15/2021   Acute cholecystitis 03/15/2021   Controlled type 2 diabetes mellitus with neuropathy ([place]) 03/15/2021   Obesity with body mass index (BMI) of 30.0 to 39.9 03/15/2021   Acute gallstone pancreatitis 03/15/2021   Abnormal cervical Papanicolaou smear 02/18/2021   Abnormal liver function tests 02/18/2021   Acquired hallux valgus 02/18/2021   Acute hypoxemic respiratory failure ([place]) 02/18/2021   Aneurysm of thoracic aorta ([place]) 02/18/2021   Anxiety 02/18/2021   Aortic root dilatation ([place]) 02/18/2021   Aortic valve disorder  02/18/2021   Arthropathy 02/18/2021   Callosity 02/18/2021   Cardiomegaly 02/18/2021   Chest discomfort 02/18/2021   Chronic kidney disease (CKD) stage G2/A1, mildly decreased glomerular filtration rate (GFR) between 60-89 mL/min/1.73 square meter and albuminuria creatinine ratio less than 30 mg/g 02/18/2021   Chronic kidney disease, stage 2 (mild) 02/18/2021   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[place]) 02/18/2021   COVID-19 virus infection 02/18/2021   Diabetic renal disease ([place]) 02/18/2021   Edema 02/18/2021   Edema of both lower legs 02/18/2021   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   Leukopenia 02/18/2021   Low back pain 02/18/2021   Malabsorption syndrome 71/04/2693   Metabolic syndrome 85/46/2703   Mild cervical dysplasia 02/18/2021   Mixed anxiety and depressive disorder 02/18/2021   Neuropathy 02/18/2021   Obstructive sleep apnea syndrome 02/18/2021   Senile purpura ([place]) 02/18/2021   Snoring 02/18/2021   Elevated transaminase level 02/18/2021   Type 2 diabetes mellitus with hyperglycemia ([place]) 02/18/2021   Urge incontinence of urine 02/18/2021   Vitamin B12 deficiency (non anemic) 02/18/2021   Vitamin D deficiency 02/18/2021   Pneumonia due to COVID-19 virus 10/25/2019   Hypokalemia 10/25/2019   Hyperbilirubinemia 10/25/2019   Pre-diabetes    History of total hip arthroplasty, right 09/18/2018   Osteoarthritis of right hip 09/14/2018   Morbid obesity, BMI not known ([employer]) 09/17/2016   Stasis ulcer ([place]) 09/17/2016   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 ([place]) 08/29/2016   Cellulitis of left lower leg 08/10/2016   Infection of flexor tendon sheath 06/14/2016   Pain of right thumb 06/14/2016   GERD (gastroesophageal reflux disease) 02/09/2013   Essential hypertension, benign 02/09/2013   Hypercholesterolemia 02/09/2013   Unspecified constipation 02/09/2013   Muscle spasm 02/09/2013   Osteoarthritis of right knee 01/26/2013    Osteoarthritis of left hip 05/10/2012   Past Medical History:  Diagnosis Date   Anxiety    Arthritis    knee, hip   Cellulitis 08/2016   left leg   Complication of anesthesia    Diabetes mellitus without complication (HCC)    GERD (gastroesophageal reflux disease)    Heart murmur    "slight "  informed years ago.-    Hernia, umbilical    History of atrial fibrillation 2007   with flagyl   History of endometriosis    Hypercholesteremia    Hypertension    has not been to a cardiologist   PONV (postoperative nausea and vomiting)    Rectal vaginal fistula    Refusal of blood transfusions as patient  is Jehovah's Witness    Sleep apnea     Family History  Problem Relation Age of Onset   Dementia Mother    Cancer Father    Breast cancer Neg Hx     Past Surgical History:  Procedure Laterality Date   APPENDECTOMY     BUNIONECTOMY Right 01/09/2019   Procedure: RIGHT FOOT BUNION CORRECTION;  Surgeon: Erle Crocker, MD;  Location: [facility];  Service: Orthopedics;  Laterality: Right;   CHOLECYSTECTOMY N/A 03/16/2021   Procedure: LAPAROSCOPIC CHOLECYSTECTOMY;  Surgeon: Stark Klein, MD;  Location[facility];  Service: General;  Laterality: N/A;   ENDOSCOPIC RETROGRADE CHOLANGIOPANCREATOGRAPHY (ERCP) WITH PROPOFOL N/A 03/15/2021   Procedure: ENDOSCOPIC RETROGRADE CHOLANGIOPANCREATOGRAPHY (ERCP) WITH PROPOFOL;  Surgeon: Ronnette Juniper, MD;  [facility];  Service: Gastroenterology;  Laterality: N/A;   EXPLORATORY LAPAROTOMY  1973   for endometrosis   HAMMERTOE RECONSTRUCTION WITH WEIL OSTEOTOMY Right 01/09/2019   Procedure: RIGHT FOOT 2-4 HAMMERTOE CORRECTION WITH WEIL OSTEOTOMY 2ND METATARSAL, DEEP TENDON TRANSFER WITH AKIN OSTEOTOMY;  Surgeon: Erle Crocker, MD;  Location: [facility];  Service: Orthopedics;  Laterality: Right;   I & D EXTREMITY Right 06/10/2016   Procedure: IRRIGATION AND DEBRIDEMENT THUMB FLEXOR SHEATH;  Surgeon: Leanora Cover, MD;  Location: [facility];  Service:  Orthopedics;  Laterality: Right;   I & D EXTREMITY Right 04/23/2022   Procedure: RIGHT LEG DEBRIDEMENT;  Surgeon: Newt Minion, MD;  Location: [facility];  Service: Orthopedics;  Laterality: Right;   JOINT REPLACEMENT     Left Hip   MANDIBLE SURGERY  1978   rectovaginal fistula repair  1980   REMOVAL OF STONES  03/15/2021   Procedure: REMOVAL OF STONES;  Surgeon: Ronnette Juniper, MD;  Location[facility];  Service: Gastroenterology;;   Joan Mayans  03/15/2021   Procedure: Joan Mayans;  Surgeon: Ronnette Juniper, MD;  Location: [facility];  Service: Gastroenterology;;   TOTAL HIP ARTHROPLASTY  05/08/2012   Procedure: TOTAL HIP ARTHROPLASTY;  Surgeon: Kerin Salen, MD;  Location: [facility];  Service: Orthopedics;  Laterality: Left;   TOTAL HIP ARTHROPLASTY Right 09/18/2018   Procedure: TOTAL HIP ARTHROPLASTY ANTERIOR APPROACH;  Surgeon: Frederik Pear, MD;  [facility];  Service: Orthopedics;  Laterality: Right;   TOTAL KNEE ARTHROPLASTY Right 01/24/2013   Dr Mayer Camel   TOTAL KNEE ARTHROPLASTY Right 01/24/2013   Procedure: RIGHT TOTAL KNEE ARTHROPLASTY;  Surgeon: Kerin Salen, MD;  Location: [facility];  Service: Orthopedics;  Laterality: Right;   Social History   Occupational History   Not on file  Tobacco Use   Smoking status: Former    Packs/day: 1.00    Years: 10.00    Total pack years: 10.00    Types: Cigarettes    Quit date: 11/08/1981    Years since quitting: 40.7   Smokeless tobacco: Never  Vaping Use   Vaping Use: Never used  Substance and Sexual Activity   Alcohol use: No   Drug use: No   Sexual activity: Not on file

## 2022-08-03 ENCOUNTER — Ambulatory Visit: Payer: Medicare Other | Admitting: Orthopedic Surgery

## 2022-08-05 ENCOUNTER — Ambulatory Visit: Payer: Medicare Other | Admitting: Orthopedic Surgery

## 2022-08-05 DIAGNOSIS — L97915 Non-pressure chronic ulcer of unspecified part of right lower leg with muscle involvement without evidence of necrosis: Secondary | ICD-10-CM | POA: Diagnosis not present

## 2022-08-09 ENCOUNTER — Encounter: Payer: Self-pay | Admitting: Orthopedic Surgery

## 2022-08-09 NOTE — Progress Notes (Signed)
Office Visit Note   Patient: Kathleen Wiley           Date of Birth: [birth year]           MRN: [id number] Visit Date: 08/05/2022              Requested by: Jonathon Jordan, MD [address] [place],   [address] PCP: Jonathon Jordan, MD  Chief Complaint  Patient presents with   Right Leg - Follow-up    04/23/2022 right leg deb of abscess      HPI: Patient is a 76 year old woman who is over 3 months status post debridement abscess right leg she is using a compression stocking with gauze underneath the stocking.  Assessment & Plan: Visit Diagnoses:  1. Ulcer of right lower extremity with muscle involvement without evidence of necrosis ([place])     Plan: Recommended putting a gauze on top of the stocking with the stocking directly against the wound.  Wear this 24 hours a day.  Follow-Up Instructions: Return in about 4 weeks (around 09/02/2022).   Ortho Exam  Patient is alert, oriented, no adenopathy, well-dressed, normal affect, normal respiratory effort. Examination the wound bed has 100% healthy granulation tissue it is 1 x 3 cm on the medial aspect of the right leg.  There is no cellulitis no odor no drainage  Imaging: No results found. No images are attached to the encounter.  Labs: Lab Results  Component Value Date   HGBA1C 5.2 04/20/2022   HGBA1C 6.4 (H) 03/15/2021   HGBA1C 7.2 (H) 10/26/2019   CRP 14.6 (H) 04/26/2022   CRP 12.3 (H) 04/25/2022   CRP 16.0 (H) 04/24/2022   REPTSTATUS 05/08/2022 FINAL 05/03/2022   GRAMSTAIN NO WBC SEEN NO ORGANISMS SEEN  04/23/2022   CULT  05/03/2022    NO GROWTH 5 DAYS Performed at [facility], [place] [address], [place], [place] [address]    LABORGA KLEBSIELLA PNEUMONIAE (A) 04/19/2022     Lab Results  Component Value Date   ALBUMIN 4.4 07/13/2022   ALBUMIN 4.0 06/04/2022   ALBUMIN 2.1 (L) 05/04/2022   PREALBUMIN 14.4 (L) 05/04/2022    Lab Results  Component Value Date   MG 2.2 05/03/2022   MG  1.9 04/26/2022   MG 1.8 04/25/2022   No results found for: "VD25OH"  Lab Results  Component Value Date   PREALBUMIN 14.4 (L) 05/04/2022      Latest Ref Rng & Units 07/13/2022    2:33 PM 07/01/2022    9:40 AM 06/04/2022    2:02 PM  CBC EXTENDED  WBC 4.0 - 10.5 K/uL 3.3  2.9  2.5   RBC 3.87 - 5.11 MIL/uL 3.76  3.39  3.20    3.15   Hemoglobin 12.0 - 15.0 g/dL 12.5  11.3  10.6   HCT 36.0 - 46.0 % 36.5  34.3  31.9   Platelets 150 - 400 K/uL 153  144  156   NEUT# 1.7 - 7.7 K/uL 1.5  1.4  1.1   Lymph# 0.7 - 4.0 K/uL 1.3  1.0  1.1      There is no height or weight on file to calculate BMI.  Orders:  No orders of the defined types were placed in this encounter.  No orders of the defined types were placed in this encounter.    Procedures: No procedures performed  Clinical Data: No additional findings.  ROS:  All other systems negative, except as noted in the  HPI. Review of Systems  Objective: Vital Signs: There were no vitals taken for this visit.  Specialty Comments:  No specialty comments available.  PMFS History: Patient Active Problem List   Diagnosis Date Noted   MDS (myelodysplastic syndrome), low grade ([place]) 07/13/2022   Prolonged QT interval 05/04/2022   Elevated TSH 05/04/2022   Abnormal ECG 05/04/2022   Cellulitis 44/11/270   Diastolic dysfunction with chronic heart failure ([place]) 05/03/2022   Sinus bradycardia 05/03/2022   Group G streptococcal infection 04/21/2022   Sepsis ([place]) 04/20/2022   Cellulitis of right lower extremity 04/20/2022   Gallstone pancreatitis 03/15/2021   Acute cholecystitis 03/15/2021   Controlled type 2 diabetes mellitus with neuropathy ([place]) 03/15/2021   Obesity with body mass index (BMI) of 30.0 to 39.9 03/15/2021   Acute gallstone pancreatitis 03/15/2021   Abnormal cervical Papanicolaou smear 02/18/2021   Abnormal liver function tests 02/18/2021   Acquired hallux valgus 02/18/2021   Acute hypoxemic respiratory failure  ([place]) 02/18/2021   Aneurysm of thoracic aorta ([place]) 02/18/2021   Anxiety 02/18/2021   Aortic root dilatation ([place]) 02/18/2021   Aortic valve disorder 02/18/2021   Arthropathy 02/18/2021   Callosity 02/18/2021   Cardiomegaly 02/18/2021   Chest discomfort 02/18/2021   Chronic kidney disease (CKD) stage G2/A1, mildly decreased glomerular filtration rate (GFR) between 60-89 mL/min/1.73 square meter and albuminuria creatinine ratio less than 30 mg/g 02/18/2021   Chronic kidney disease, stage 2 (mild) 02/18/2021   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[place]) 02/18/2021   COVID-19 virus infection 02/18/2021   Diabetic renal disease ([place]) 02/18/2021   Edema 02/18/2021   Edema of both lower legs 02/18/2021   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   Leukopenia 02/18/2021   Low back pain 02/18/2021   Malabsorption syndrome 53/66/4403   Metabolic syndrome 47/42/5956   Mild cervical dysplasia 02/18/2021   Mixed anxiety and depressive disorder 02/18/2021   Neuropathy 02/18/2021   Obstructive sleep apnea syndrome 02/18/2021   Senile purpura ([place]) 02/18/2021   Snoring 02/18/2021   Elevated transaminase level 02/18/2021   Type 2 diabetes mellitus with hyperglycemia ([place]) 02/18/2021   Urge incontinence of urine 02/18/2021   Vitamin B12 deficiency (non anemic) 02/18/2021   Vitamin D deficiency 02/18/2021   Pneumonia due to COVID-19 virus 10/25/2019   Hypokalemia 10/25/2019   Hyperbilirubinemia 10/25/2019   Pre-diabetes    History of total hip arthroplasty, right 09/18/2018   Osteoarthritis of right hip 09/14/2018   Morbid obesity, BMI not known ([place]) 09/17/2016   Stasis ulcer ([place]) 09/17/2016   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 ([place]) 08/29/2016   Cellulitis of left lower leg 08/10/2016   Infection of flexor tendon sheath 06/14/2016   Pain of right thumb 06/14/2016   GERD (gastroesophageal reflux disease) 02/09/2013   Essential hypertension, benign 02/09/2013    Hypercholesterolemia 02/09/2013   Unspecified constipation 02/09/2013   Muscle spasm 02/09/2013   Osteoarthritis of right knee 01/26/2013   Osteoarthritis of left hip 05/10/2012   Past Medical History:  Diagnosis Date   Anxiety    Arthritis    knee, hip   Cellulitis 08/2016   left leg   Complication of anesthesia    Diabetes mellitus without complication (HCC)    GERD (gastroesophageal reflux disease)    Heart murmur    "slight "  informed years ago.-    Hernia, umbilical    History of atrial fibrillation 2007   with flagyl   History of endometriosis    Hypercholesteremia  Hypertension    has not been to a cardiologist   PONV (postoperative nausea and vomiting)    Rectal vaginal fistula    Refusal of blood transfusions as patient is Jehovah's Witness    Sleep apnea     Family History  Problem Relation Age of Onset   Dementia Mother    Cancer Father    Breast cancer Neg Hx     Past Surgical History:  Procedure Laterality Date   APPENDECTOMY     BUNIONECTOMY Right 01/09/2019   Procedure: RIGHT FOOT BUNION CORRECTION;  Surgeon: Erle Crocker, MD;  Location: [facility];  Service: Orthopedics;  Laterality: Right;   CHOLECYSTECTOMY N/A 03/16/2021   Procedure: LAPAROSCOPIC CHOLECYSTECTOMY;  Surgeon: Stark Klein, MD;  [facility];  Service: General;  Laterality: N/A;   ENDOSCOPIC RETROGRADE CHOLANGIOPANCREATOGRAPHY (ERCP) WITH PROPOFOL N/A 03/15/2021   Procedure: ENDOSCOPIC RETROGRADE CHOLANGIOPANCREATOGRAPHY (ERCP) WITH PROPOFOL;  Surgeon: Ronnette Juniper, MD;  [facility];  Service: Gastroenterology;  Laterality: N/A;   EXPLORATORY LAPAROTOMY  1973   for endometrosis   HAMMERTOE RECONSTRUCTION WITH WEIL OSTEOTOMY Right 01/09/2019   Procedure: RIGHT FOOT 2-4 HAMMERTOE CORRECTION WITH WEIL OSTEOTOMY 2ND METATARSAL, DEEP TENDON TRANSFER WITH AKIN OSTEOTOMY;  Surgeon: Erle Crocker, MD;  Location: [facility];  Service: Orthopedics;  Laterality: Right;   I & D  EXTREMITY Right 06/10/2016   Procedure: IRRIGATION AND DEBRIDEMENT THUMB FLEXOR SHEATH;  Surgeon: Leanora Cover, MD;  Location: [facility];  Service: Orthopedics;  Laterality: Right;   I & D EXTREMITY Right 04/23/2022   Procedure: RIGHT LEG DEBRIDEMENT;  Surgeon: Newt Minion, MD;  Location: [facility];  Service: Orthopedics;  Laterality: Right;   JOINT REPLACEMENT     Left Hip   MANDIBLE SURGERY  1978   rectovaginal fistula repair  1980   REMOVAL OF STONES  03/15/2021   Procedure: REMOVAL OF STONES;  Surgeon: Ronnette Juniper, MD;  Location[facility];  Service: Gastroenterology;;   Joan Mayans  03/15/2021   Procedure: Joan Mayans;  Surgeon: Ronnette Juniper, MD;  Location[facility];  Service: Gastroenterology;;   TOTAL HIP ARTHROPLASTY  05/08/2012   Procedure: TOTAL HIP ARTHROPLASTY;  Surgeon: Kerin Salen, MD;  Location: [facility];  Service: Orthopedics;  Laterality: Left;   TOTAL HIP ARTHROPLASTY Right 09/18/2018   Procedure: TOTAL HIP ARTHROPLASTY ANTERIOR APPROACH;  Surgeon: Frederik Pear, MD;  [facility];  Service: Orthopedics;  Laterality: Right;   TOTAL KNEE ARTHROPLASTY Right 01/24/2013   Dr Mayer Camel   TOTAL KNEE ARTHROPLASTY Right 01/24/2013   Procedure: RIGHT TOTAL KNEE ARTHROPLASTY;  Surgeon: Kerin Salen, MD;  Location: [facility];  Service: Orthopedics;  Laterality: Right;   Social History   Occupational History   Not on file  Tobacco Use   Smoking status: Former    Packs/day: 1.00    Years: 10.00    Total pack years: 10.00    Types: Cigarettes    Quit date: 11/08/1981    Years since quitting: 40.7   Smokeless tobacco: Never  Vaping Use   Vaping Use: Never used  Substance and Sexual Activity   Alcohol use: No   Drug use: No   Sexual activity: Not on file

## 2022-08-12 DIAGNOSIS — M25512 Pain in left shoulder: Secondary | ICD-10-CM | POA: Diagnosis not present

## 2022-09-02 ENCOUNTER — Ambulatory Visit: Payer: Medicare Other | Admitting: Orthopedic Surgery

## 2022-09-02 DIAGNOSIS — L97915 Non-pressure chronic ulcer of unspecified part of right lower leg with muscle involvement without evidence of necrosis: Secondary | ICD-10-CM

## 2022-09-03 ENCOUNTER — Encounter: Payer: Self-pay | Admitting: Orthopedic Surgery

## 2022-09-03 NOTE — Progress Notes (Signed)
Office Visit Note   Patient: Kathleen Wiley           Date of Birth: 11/28/1945           MRN: [id number] Visit Date: 09/02/2022              Requested by: Jonathon Jordan, MD [address] [place],  [place] [address] PCP: Jonathon Jordan, MD  Chief Complaint  Patient presents with   Right Leg - Follow-up      HPI: Patient is a 76 year old woman who is 4 months status post right lower extremity debridement.  She is currently in knee-high compression socks.  Assessment & Plan: Visit Diagnoses:  1. Ulcer of right lower extremity with muscle involvement without evidence of necrosis [facility])     Plan: Patient will continue with the compression socks.  Follow-Up Instructions: Return in about 4 weeks (around 09/30/2022).   Ortho Exam  Patient is alert, oriented, no adenopathy, well-dressed, normal affect, normal respiratory effort. Examination there is no redness cellulitis odor or drainage.  Patient's wound is 10 x 5 mm with healthy granulation tissue.  Imaging: No results found. No images are attached to the encounter.  Labs: Lab Results  Component Value Date   HGBA1C 5.2 04/20/2022   HGBA1C 6.4 (H) 03/15/2021   HGBA1C 7.2 (H) 10/26/2019   CRP 14.6 (H) 04/26/2022   CRP 12.3 (H) 04/25/2022   CRP 16.0 (H) 04/24/2022   REPTSTATUS 05/08/2022 FINAL 05/03/2022   GRAMSTAIN NO WBC SEEN NO ORGANISMS SEEN  04/23/2022   CULT  05/03/2022    NO GROWTH 5 DAYS Performed at [facility], [place] [address], [place], [place] [address]    LABORGA KLEBSIELLA PNEUMONIAE (A) 04/19/2022     Lab Results  Component Value Date   ALBUMIN 4.4 07/13/2022   ALBUMIN 4.0 06/04/2022   ALBUMIN 2.1 (L) 05/04/2022   PREALBUMIN 14.4 (L) 05/04/2022    Lab Results  Component Value Date   MG 2.2 05/03/2022   MG 1.9 04/26/2022   MG 1.8 04/25/2022   No results found for: "VD25OH"  Lab Results  Component Value Date   PREALBUMIN 14.4 (L) 05/04/2022      Latest Ref Rng &  Units 07/13/2022    2:33 PM 07/01/2022    9:40 AM 06/04/2022    2:02 PM  CBC EXTENDED  WBC 4.0 - 10.5 K/uL 3.3  2.9  2.5   RBC 3.87 - 5.11 MIL/uL 3.76  3.39  3.20    3.15   Hemoglobin 12.0 - 15.0 g/dL 12.5  11.3  10.6   HCT 36.0 - 46.0 % 36.5  34.3  31.9   Platelets 150 - 400 K/uL 153  144  156   NEUT# 1.7 - 7.7 K/uL 1.5  1.4  1.1   Lymph# 0.7 - 4.0 K/uL 1.3  1.0  1.1      There is no height or weight on file to calculate BMI.  Orders:  No orders of the defined types were placed in this encounter.  No orders of the defined types were placed in this encounter.    Procedures: No procedures performed  Clinical Data: No additional findings.  ROS:  All other systems negative, except as noted in the HPI. Review of Systems  Objective: Vital Signs: There were no vitals taken for this visit.  Specialty Comments:  No specialty comments available.  PMFS History: Patient Active Problem List   Diagnosis Date Noted   MDS (myelodysplastic  syndrome), low grade (HCC) 07/13/2022   Prolonged QT interval 05/04/2022   Elevated TSH 05/04/2022   Abnormal ECG 05/04/2022   Cellulitis 76/16/0737   Diastolic dysfunction with chronic heart failure ([place]) 05/03/2022   Sinus bradycardia 05/03/2022   Group G streptococcal infection 04/21/2022   Sepsis ([place]) 04/20/2022   Cellulitis of right lower extremity 04/20/2022   Gallstone pancreatitis 03/15/2021   Acute cholecystitis 03/15/2021   Controlled type 2 diabetes mellitus with neuropathy ([place]) 03/15/2021   Obesity with body mass index (BMI) of 30.0 to 39.9 03/15/2021   Acute gallstone pancreatitis 03/15/2021   Abnormal cervical Papanicolaou smear 02/18/2021   Abnormal liver function tests 02/18/2021   Acquired hallux valgus 02/18/2021   Acute hypoxemic respiratory failure ([place]) 02/18/2021   Aneurysm of thoracic aorta ([place]) 02/18/2021   Anxiety 02/18/2021   Aortic root dilatation ([place]) 02/18/2021   Aortic valve disorder 02/18/2021    Arthropathy 02/18/2021   Callosity 02/18/2021   Cardiomegaly 02/18/2021   Chest discomfort 02/18/2021   Chronic kidney disease (CKD) stage G2/A1, mildly decreased glomerular filtration rate (GFR) between 60-89 mL/min/1.73 square meter and albuminuria creatinine ratio less than 30 mg/g 02/18/2021   Chronic kidney disease, stage 2 (mild) 02/18/2021   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[place]) 02/18/2021   COVID-19 virus infection 02/18/2021   Diabetic renal disease ([place]) 02/18/2021   Edema 02/18/2021   Edema of both lower legs 02/18/2021   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   Leukopenia 02/18/2021   Low back pain 02/18/2021   Malabsorption syndrome 10/62/6948   Metabolic syndrome 54/62/7035   Mild cervical dysplasia 02/18/2021   Mixed anxiety and depressive disorder 02/18/2021   Neuropathy 02/18/2021   Obstructive sleep apnea syndrome 02/18/2021   Senile purpura ([place]) 02/18/2021   Snoring 02/18/2021   Elevated transaminase level 02/18/2021   Type 2 diabetes mellitus with hyperglycemia ([place]) 02/18/2021   Urge incontinence of urine 02/18/2021   Vitamin B12 deficiency (non anemic) 02/18/2021   Vitamin D deficiency 02/18/2021   Pneumonia due to COVID-19 virus 10/25/2019   Hypokalemia 10/25/2019   Hyperbilirubinemia 10/25/2019   Pre-diabetes    History of total hip arthroplasty, right 09/18/2018   Osteoarthritis of right hip 09/14/2018   Morbid obesity, BMI not known ([place]) 09/17/2016   Stasis ulcer ([place]) 09/17/2016   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 ([place]) 08/29/2016   Cellulitis of left lower leg 08/10/2016   Infection of flexor tendon sheath 06/14/2016   Pain of right thumb 06/14/2016   GERD (gastroesophageal reflux disease) 02/09/2013   Essential hypertension, benign 02/09/2013   Hypercholesterolemia 02/09/2013   Unspecified constipation 02/09/2013   Muscle spasm 02/09/2013   Osteoarthritis of right knee 01/26/2013   Osteoarthritis of left  hip 05/10/2012   Past Medical History:  Diagnosis Date   Anxiety    Arthritis    knee, hip   Cellulitis 08/2016   left leg   Complication of anesthesia    Diabetes mellitus without complication (HCC)    GERD (gastroesophageal reflux disease)    Heart murmur    "slight "  informed years ago.-    Hernia, umbilical    History of atrial fibrillation 2007   with flagyl   History of endometriosis    Hypercholesteremia    Hypertension    has not been to a cardiologist   PONV (postoperative nausea and vomiting)    Rectal vaginal fistula    Refusal of blood transfusions as patient is Jehovah's Witness    Sleep  apnea     Family History  Problem Relation Age of Onset   Dementia Mother    Cancer Father    Breast cancer Neg Hx     Past Surgical History:  Procedure Laterality Date   APPENDECTOMY     BUNIONECTOMY Right 01/09/2019   Procedure: RIGHT FOOT BUNION CORRECTION;  Surgeon: Erle Crocker, MD;  Location: [facility];  Service: Orthopedics;  Laterality: Right;   CHOLECYSTECTOMY N/A 03/16/2021   Procedure: LAPAROSCOPIC CHOLECYSTECTOMY;  Surgeon: Stark Klein, MD;  [facility];  Service: General;  Laterality: N/A;   ENDOSCOPIC RETROGRADE CHOLANGIOPANCREATOGRAPHY (ERCP) WITH PROPOFOL N/A 03/15/2021   Procedure: ENDOSCOPIC RETROGRADE CHOLANGIOPANCREATOGRAPHY (ERCP) WITH PROPOFOL;  Surgeon: Ronnette Juniper, MD;  [facility];  Service: Gastroenterology;  Laterality: N/A;   EXPLORATORY LAPAROTOMY  1973   for endometrosis   HAMMERTOE RECONSTRUCTION WITH WEIL OSTEOTOMY Right 01/09/2019   Procedure: RIGHT FOOT 2-4 HAMMERTOE CORRECTION WITH WEIL OSTEOTOMY 2ND METATARSAL, DEEP TENDON TRANSFER WITH AKIN OSTEOTOMY;  Surgeon: Erle Crocker, MD;  Location: [facility];  Service: Orthopedics;  Laterality: Right;   I & D EXTREMITY Right 06/10/2016   Procedure: IRRIGATION AND DEBRIDEMENT THUMB FLEXOR SHEATH;  Surgeon: Leanora Cover, MD;  Location: [facility];  Service: Orthopedics;  Laterality:  Right;   I & D EXTREMITY Right 04/23/2022   Procedure: RIGHT LEG DEBRIDEMENT;  Surgeon: Newt Minion, MD;  Location: [place] [facility];  Service: Orthopedics;  Laterality: Right;   JOINT REPLACEMENT     Left Hip   MANDIBLE SURGERY  1978   rectovaginal fistula repair  1980   REMOVAL OF STONES  03/15/2021   Procedure: REMOVAL OF STONES;  Surgeon: Ronnette Juniper, MD;  [facility];  Service: Gastroenterology;;   Joan Mayans  03/15/2021   Procedure: Joan Mayans;  Surgeon: Ronnette Juniper, MD;  Location[facility];  Service: Gastroenterology;;   TOTAL HIP ARTHROPLASTY  05/08/2012   Procedure: TOTAL HIP ARTHROPLASTY;  Surgeon: Kerin Salen, MD;  Location: [facility];  Service: Orthopedics;  Laterality: Left;   TOTAL HIP ARTHROPLASTY Right 09/18/2018   Procedure: TOTAL HIP ARTHROPLASTY ANTERIOR APPROACH;  Surgeon: Frederik Pear, MD;  [facility];  Service: Orthopedics;  Laterality: Right;   TOTAL KNEE ARTHROPLASTY Right 01/24/2013   Dr Mayer Camel   TOTAL KNEE ARTHROPLASTY Right 01/24/2013   Procedure: RIGHT TOTAL KNEE ARTHROPLASTY;  Surgeon: Kerin Salen, MD;  Location: [facility];  Service: Orthopedics;  Laterality: Right;   Social History   Occupational History   Not on file  Tobacco Use   Smoking status: Former    Packs/day: 1.00    Years: 10.00    Total pack years: 10.00    Types: Cigarettes    Quit date: 11/08/1981    Years since quitting: 40.8   Smokeless tobacco: Never  Vaping Use   Vaping Use: Never used  Substance and Sexual Activity   Alcohol use: No   Drug use: No   Sexual activity: Not on file

## 2022-09-06 ENCOUNTER — Telehealth: Payer: Self-pay | Admitting: *Deleted

## 2022-09-06 NOTE — Patient Outreach (Signed)
  Care Coordination   09/06/2022 Name: Kathleen Wiley MRN: [id number] DOB: [birth year]   Care Coordination Outreach Attempts:  An unsuccessful telephone outreach was attempted today to offer the patient information about available care coordination services as a benefit of their health plan.   Follow Up Plan:  Additional outreach attempts will be made to offer the patient care coordination information and services.   Encounter Outcome:  No Answer  Care Coordination Interventions Activated:  No   Care Coordination Interventions:  No, not indicated    Raina Mina, RN Care Management Coordinator [place] Office [phone]

## 2022-10-04 ENCOUNTER — Ambulatory Visit: Payer: Medicare Other | Admitting: Orthopedic Surgery

## 2022-10-04 DIAGNOSIS — L97915 Non-pressure chronic ulcer of unspecified part of right lower leg with muscle involvement without evidence of necrosis: Secondary | ICD-10-CM | POA: Diagnosis not present

## 2022-10-04 DIAGNOSIS — L03115 Cellulitis of right lower limb: Secondary | ICD-10-CM | POA: Diagnosis not present

## 2022-10-05 DIAGNOSIS — R109 Unspecified abdominal pain: Secondary | ICD-10-CM | POA: Diagnosis not present

## 2022-10-05 DIAGNOSIS — K529 Noninfective gastroenteritis and colitis, unspecified: Secondary | ICD-10-CM | POA: Diagnosis not present

## 2022-10-05 DIAGNOSIS — R143 Flatulence: Secondary | ICD-10-CM | POA: Diagnosis not present

## 2022-10-05 DIAGNOSIS — R140 Abdominal distension (gaseous): Secondary | ICD-10-CM | POA: Diagnosis not present

## 2022-10-10 ENCOUNTER — Encounter: Payer: Self-pay | Admitting: Orthopedic Surgery

## 2022-10-10 NOTE — Progress Notes (Signed)
Office Visit Note   Patient: Kathleen Wiley           Date of Birth: [birth year]           MRN: [id number] Visit Date: 10/04/2022              Requested by: Jonathon Jordan, MD [address] [place],  [place] [address] PCP: Jonathon Jordan, MD  Chief Complaint  Patient presents with   Right Leg - Follow-up    04/23/2022 RLE debridement       HPI: Patient is a 76 year old woman status post debridement right lower extremity currently wearing compression socks but not today.  Assessment & Plan: Visit Diagnoses:  1. Ulcer of right lower extremity with muscle involvement without evidence of necrosis ([place])   2. Cellulitis of right lower extremity     Plan: Continue with compression for the venous insufficiency as well as elevation and exercise.  Follow-Up Instructions: Return if symptoms worsen or fail to improve.   Ortho Exam  Patient is alert, oriented, no adenopathy, well-dressed, normal affect, normal respiratory effort. Examination the medial incision is flat well-healed.  Patient still has venous stasis swelling but the ulcers are healed.  Imaging: No results found. No images are attached to the encounter.  Labs: Lab Results  Component Value Date   HGBA1C 5.2 04/20/2022   HGBA1C 6.4 (H) 03/15/2021   HGBA1C 7.2 (H) 10/26/2019   CRP 14.6 (H) 04/26/2022   CRP 12.3 (H) 04/25/2022   CRP 16.0 (H) 04/24/2022   REPTSTATUS 05/08/2022 FINAL 05/03/2022   GRAMSTAIN NO WBC SEEN NO ORGANISMS SEEN  04/23/2022   CULT  05/03/2022    NO GROWTH 5 DAYS Performed at [facility], [place] [address], [place], [place] [address]    LABORGA KLEBSIELLA PNEUMONIAE (A) 04/19/2022     Lab Results  Component Value Date   ALBUMIN 4.4 07/13/2022   ALBUMIN 4.0 06/04/2022   ALBUMIN 2.1 (L) 05/04/2022   PREALBUMIN 14.4 (L) 05/04/2022    Lab Results  Component Value Date   MG 2.2 05/03/2022   MG 1.9 04/26/2022   MG 1.8 04/25/2022   No results found for:  "VD25OH"  Lab Results  Component Value Date   PREALBUMIN 14.4 (L) 05/04/2022      Latest Ref Rng & Units 07/13/2022    2:33 PM 07/01/2022    9:40 AM 06/04/2022    2:02 PM  CBC EXTENDED  WBC 4.0 - 10.5 K/uL 3.3  2.9  2.5   RBC 3.87 - 5.11 MIL/uL 3.76  3.39  3.20    3.15   Hemoglobin 12.0 - 15.0 g/dL 12.5  11.3  10.6   HCT 36.0 - 46.0 % 36.5  34.3  31.9   Platelets 150 - 400 K/uL 153  144  156   NEUT# 1.7 - 7.7 K/uL 1.5  1.4  1.1   Lymph# 0.7 - 4.0 K/uL 1.3  1.0  1.1      There is no height or weight on file to calculate BMI.  Orders:  No orders of the defined types were placed in this encounter.  No orders of the defined types were placed in this encounter.    Procedures: No procedures performed  Clinical Data: No additional findings.  ROS:  All other systems negative, except as noted in the HPI. Review of Systems  Objective: Vital Signs: There were no vitals taken for this visit.  Specialty Comments:  No specialty comments available.  PMFS History: Patient Active Problem List   Diagnosis Date Noted   MDS (myelodysplastic syndrome), low grade ([place]) 07/13/2022   Prolonged QT interval 05/04/2022   Elevated TSH 05/04/2022   Abnormal ECG 05/04/2022   Cellulitis 71/24/5809   Diastolic dysfunction with chronic heart failure ([place]) 05/03/2022   Sinus bradycardia 05/03/2022   Group G streptococcal infection 04/21/2022   Sepsis ([place]) 04/20/2022   Cellulitis of right lower extremity 04/20/2022   Gallstone pancreatitis 03/15/2021   Acute cholecystitis 03/15/2021   Controlled type 2 diabetes mellitus with neuropathy ([place]) 03/15/2021   Obesity with body mass index (BMI) of 30.0 to 39.9 03/15/2021   Acute gallstone pancreatitis 03/15/2021   Abnormal cervical Papanicolaou smear 02/18/2021   Abnormal liver function tests 02/18/2021   Acquired hallux valgus 02/18/2021   Acute hypoxemic respiratory failure ([place]) 02/18/2021   Aneurysm of thoracic aorta ([place]) 02/18/2021    Anxiety 02/18/2021   Aortic root dilatation ([place]) 02/18/2021   Aortic valve disorder 02/18/2021   Arthropathy 02/18/2021   Callosity 02/18/2021   Cardiomegaly 02/18/2021   Chest discomfort 02/18/2021   Chronic kidney disease (CKD) stage G2/A1, mildly decreased glomerular filtration rate (GFR) between 60-89 mL/min/1.73 square meter and albuminuria creatinine ratio less than 30 mg/g 02/18/2021   Chronic kidney disease, stage 2 (mild) 02/18/2021   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[place]) 02/18/2021   COVID-19 virus infection 02/18/2021   Diabetic renal disease ([place]) 02/18/2021   Edema 02/18/2021   Edema of both lower legs 02/18/2021   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   Leukopenia 02/18/2021   Low back pain 02/18/2021   Malabsorption syndrome 98/33/8250   Metabolic syndrome 53/97/6734   Mild cervical dysplasia 02/18/2021   Mixed anxiety and depressive disorder 02/18/2021   Neuropathy 02/18/2021   Obstructive sleep apnea syndrome 02/18/2021   Senile purpura ([place]) 02/18/2021   Snoring 02/18/2021   Elevated transaminase level 02/18/2021   Type 2 diabetes mellitus with hyperglycemia ([place]) 02/18/2021   Urge incontinence of urine 02/18/2021   Vitamin B12 deficiency (non anemic) 02/18/2021   Vitamin D deficiency 02/18/2021   Pneumonia due to COVID-19 virus 10/25/2019   Hypokalemia 10/25/2019   Hyperbilirubinemia 10/25/2019   Pre-diabetes    History of total hip arthroplasty, right 09/18/2018   Osteoarthritis of right hip 09/14/2018   Morbid obesity, BMI not known ([place]) 09/17/2016   Stasis ulcer ([place]) 09/17/2016   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 ([place]) 08/29/2016   Cellulitis of left lower leg 08/10/2016   Infection of flexor tendon sheath 06/14/2016   Pain of right thumb 06/14/2016   GERD (gastroesophageal reflux disease) 02/09/2013   Essential hypertension, benign 02/09/2013   Hypercholesterolemia 02/09/2013   Unspecified constipation  02/09/2013   Muscle spasm 02/09/2013   Osteoarthritis of right knee 01/26/2013   Osteoarthritis of left hip 05/10/2012   Past Medical History:  Diagnosis Date   Anxiety    Arthritis    knee, hip   Cellulitis 08/2016   left leg   Complication of anesthesia    Diabetes mellitus without complication (HCC)    GERD (gastroesophageal reflux disease)    Heart murmur    "slight "  informed years ago.-    Hernia, umbilical    History of atrial fibrillation 2007   with flagyl   History of endometriosis    Hypercholesteremia    Hypertension    has not been to a cardiologist   PONV (postoperative nausea and vomiting)    Rectal vaginal fistula  Refusal of blood transfusions as patient is Jehovah's Witness    Sleep apnea     Family History  Problem Relation Age of Onset   Dementia Mother    Cancer Father    Breast cancer Neg Hx     Past Surgical History:  Procedure Laterality Date   APPENDECTOMY     BUNIONECTOMY Right 01/09/2019   Procedure: RIGHT FOOT BUNION CORRECTION;  Surgeon: Erle Crocker, MD;  Location: [facility];  Service: Orthopedics;  Laterality: Right;   CHOLECYSTECTOMY N/A 03/16/2021   Procedure: LAPAROSCOPIC CHOLECYSTECTOMY;  Surgeon: Stark Klein, MD;  [facility];  Service: General;  Laterality: N/A;   ENDOSCOPIC RETROGRADE CHOLANGIOPANCREATOGRAPHY (ERCP) WITH PROPOFOL N/A 03/15/2021   Procedure: ENDOSCOPIC RETROGRADE CHOLANGIOPANCREATOGRAPHY (ERCP) WITH PROPOFOL;  Surgeon: Ronnette Juniper, MD;  [facility];  Service: Gastroenterology;  Laterality: N/A;   EXPLORATORY LAPAROTOMY  1973   for endometrosis   HAMMERTOE RECONSTRUCTION WITH WEIL OSTEOTOMY Right 01/09/2019   Procedure: RIGHT FOOT 2-4 HAMMERTOE CORRECTION WITH WEIL OSTEOTOMY 2ND METATARSAL, DEEP TENDON TRANSFER WITH AKIN OSTEOTOMY;  Surgeon: Erle Crocker, MD;  Location: [facility];  Service: Orthopedics;  Laterality: Right;   I & D EXTREMITY Right 06/10/2016   Procedure: IRRIGATION AND DEBRIDEMENT  THUMB FLEXOR SHEATH;  Surgeon: Leanora Cover, MD;  Location: [facility];  Service: Orthopedics;  Laterality: Right;   I & D EXTREMITY Right 04/23/2022   Procedure: RIGHT LEG DEBRIDEMENT;  Surgeon: Newt Minion, MD;  Location: [facility];  Service: Orthopedics;  Laterality: Right;   JOINT REPLACEMENT     Left Hip   MANDIBLE SURGERY  1978   rectovaginal fistula repair  1980   REMOVAL OF STONES  03/15/2021   Procedure: REMOVAL OF STONES;  Surgeon: Ronnette Juniper, MD;  [facility];  Service: Gastroenterology;;   Joan Mayans  03/15/2021   Procedure: Joan Mayans;  Surgeon: Ronnette Juniper, MD;  [facility];  Service: Gastroenterology;;   TOTAL HIP ARTHROPLASTY  05/08/2012   Procedure: TOTAL HIP ARTHROPLASTY;  Surgeon: Kerin Salen, MD;  Location: [facility];  Service: Orthopedics;  Laterality: Left;   TOTAL HIP ARTHROPLASTY Right 09/18/2018   Procedure: TOTAL HIP ARTHROPLASTY ANTERIOR APPROACH;  Surgeon: Frederik Pear, MD;  [facility];  Service: Orthopedics;  Laterality: Right;   TOTAL KNEE ARTHROPLASTY Right 01/24/2013   Dr Mayer Camel   TOTAL KNEE ARTHROPLASTY Right 01/24/2013   Procedure: RIGHT TOTAL KNEE ARTHROPLASTY;  Surgeon: Kerin Salen, MD;  Location: [facility];  Service: Orthopedics;  Laterality: Right;   Social History   Occupational History   Not on file  Tobacco Use   Smoking status: Former    Packs/day: 1.00    Years: 10.00    Total pack years: 10.00    Types: Cigarettes    Quit date: 11/08/1981    Years since quitting: 40.9   Smokeless tobacco: Never  Vaping Use   Vaping Use: Never used  Substance and Sexual Activity   Alcohol use: No   Drug use: No   Sexual activity: Not on file

## 2022-10-27 ENCOUNTER — Telehealth: Payer: Self-pay | Admitting: *Deleted

## 2022-10-27 NOTE — Patient Outreach (Signed)
  Care Coordination   10/27/2022 Name: Kathleen Wiley MRN: [id number] DOB: [birth year]   Care Coordination Outreach Attempts:  A second unsuccessful outreach was attempted today to offer the patient with information about available care coordination services as a benefit of their health plan.     Follow Up Plan:  Additional outreach attempts will be made to offer the patient care coordination information and services.   Encounter Outcome:  No Answer   Care Coordination Interventions:  No, not indicated    Raina Mina, RN Care Management Coordinator [place] Office [phone]

## 2022-11-15 ENCOUNTER — Telehealth: Payer: Self-pay | Admitting: Hematology and Oncology

## 2022-11-15 NOTE — Telephone Encounter (Signed)
Called patient to r/s 3/6 appointment due to provider PAL. Left voicemail with new appointment information.

## 2022-12-24 ENCOUNTER — Ambulatory Visit: Payer: Medicare Other | Admitting: Podiatry

## 2022-12-24 DIAGNOSIS — M79675 Pain in left toe(s): Secondary | ICD-10-CM

## 2022-12-24 DIAGNOSIS — B351 Tinea unguium: Secondary | ICD-10-CM

## 2022-12-24 DIAGNOSIS — E119 Type 2 diabetes mellitus without complications: Secondary | ICD-10-CM | POA: Diagnosis not present

## 2022-12-24 DIAGNOSIS — M79674 Pain in right toe(s): Secondary | ICD-10-CM | POA: Diagnosis not present

## 2022-12-24 NOTE — Progress Notes (Signed)
Chief Complaint  Patient presents with   Diabetes    Diabetic foot care, nail trim, left hallux is peeling and discolored, patient is having some pain, around the nail,  rate of pain 7 out of 10,  started 3 weeks ago, patient denies any drainage     SUBJECTIVE Patient with a history of diabetes mellitus presents to office today complaining of elongated, thickened nails that cause pain while ambulating in shoes.  Patient is unable to trim their own nails.  Patient is also concerned because she has developed thick skin around the left great toe.  Denies a history of injury.  Gradual onset.   patient is here for further evaluation and treatment.  Past Medical History:  Diagnosis Date   Anxiety    Arthritis    knee, hip   Cellulitis 08/2016   left leg   Complication of anesthesia    Diabetes mellitus without complication (HCC)    GERD (gastroesophageal reflux disease)    Heart murmur    "slight "  informed years ago.-    Hernia, umbilical    History of atrial fibrillation 2007   with flagyl   History of endometriosis    Hypercholesteremia    Hypertension    has not been to a cardiologist   PONV (postoperative nausea and vomiting)    Rectal vaginal fistula    Refusal of blood transfusions as patient is Jehovah's Witness    Sleep apnea     Allergies  Allergen Reactions   Other     Refuses whole blood but does accept albumin   Atorvastatin Other (See Comments)   Metronidazole Other (See Comments)    Chest pain, A-fib    Oxybutynin Other (See Comments)   Septra [Sulfamethoxazole-Trimethoprim] Other (See Comments)    Blisters      OBJECTIVE General Patient is awake, alert, and oriented x 3 and in no acute distress. Derm Skin is dry and supple bilateral. Negative open lesions or macerations. Remaining integument unremarkable. Nails are tender, long, thickened and dystrophic with subungual debris, consistent with onychomycosis, 1-5 bilateral. No signs of infection noted.   Hyperkeratotic callus also noted encompassing the medial aspect of the left great toe as well as the distal tips of the bilateral toes.  No open wounds Vasc  DP and PT pedal pulses palpable bilaterally. Temperature gradient within normal limits.  Neuro Epicritic and protective threshold sensation diminished bilaterally.  Musculoskeletal Exam No symptomatic pedal deformities noted bilateral. Muscular strength within normal limits.  ASSESSMENT 1. Diabetes Mellitus w/ peripheral neuropathy 2.  Pain due to onychomycosis of toenails bilateral 3.  Symptomatic calluses bilateral toes  PLAN OF CARE 1. Patient evaluated today.  Comprehensive diabetic foot exam performed today 2. Instructed to maintain good pedal hygiene and foot care. Stressed importance of controlling blood sugar.  3. Mechanical debridement of nails 1-5 bilaterally performed using a nail nipper. Filed with dremel without incident.  4.  Excisional debridement of the hyperkeratotic callus tissue was performed using a tissue nipper without incident or bleeding.  No open wounds noted  5.  Return to clinic in 3 mos.     Edrick Kins, DPM [facility]  Dr. Edrick Kins, DPM    [address],  [place] [address]                Office [phone]  Fax [phone]

## 2023-01-11 DIAGNOSIS — I10 Essential (primary) hypertension: Secondary | ICD-10-CM | POA: Diagnosis not present

## 2023-01-11 DIAGNOSIS — E559 Vitamin D deficiency, unspecified: Secondary | ICD-10-CM | POA: Diagnosis not present

## 2023-01-11 DIAGNOSIS — Z23 Encounter for immunization: Secondary | ICD-10-CM | POA: Diagnosis not present

## 2023-01-11 DIAGNOSIS — I5031 Acute diastolic (congestive) heart failure: Secondary | ICD-10-CM | POA: Diagnosis not present

## 2023-01-11 DIAGNOSIS — K7460 Unspecified cirrhosis of liver: Secondary | ICD-10-CM | POA: Diagnosis not present

## 2023-01-11 DIAGNOSIS — I77810 Thoracic aortic ectasia: Secondary | ICD-10-CM | POA: Diagnosis not present

## 2023-01-11 DIAGNOSIS — G4733 Obstructive sleep apnea (adult) (pediatric): Secondary | ICD-10-CM | POA: Diagnosis not present

## 2023-01-11 DIAGNOSIS — E785 Hyperlipidemia, unspecified: Secondary | ICD-10-CM | POA: Diagnosis not present

## 2023-01-11 DIAGNOSIS — K219 Gastro-esophageal reflux disease without esophagitis: Secondary | ICD-10-CM | POA: Diagnosis not present

## 2023-01-11 DIAGNOSIS — E1122 Type 2 diabetes mellitus with diabetic chronic kidney disease: Secondary | ICD-10-CM | POA: Diagnosis not present

## 2023-01-11 DIAGNOSIS — Z79899 Other long term (current) drug therapy: Secondary | ICD-10-CM | POA: Diagnosis not present

## 2023-01-11 DIAGNOSIS — K146 Glossodynia: Secondary | ICD-10-CM | POA: Diagnosis not present

## 2023-01-11 DIAGNOSIS — Z0000 Encounter for general adult medical examination without abnormal findings: Secondary | ICD-10-CM | POA: Diagnosis not present

## 2023-01-11 DIAGNOSIS — I359 Nonrheumatic aortic valve disorder, unspecified: Secondary | ICD-10-CM | POA: Diagnosis not present

## 2023-01-11 DIAGNOSIS — D469 Myelodysplastic syndrome, unspecified: Secondary | ICD-10-CM | POA: Diagnosis not present

## 2023-01-12 ENCOUNTER — Ambulatory Visit: Payer: Medicare Other | Admitting: Hematology and Oncology

## 2023-01-12 ENCOUNTER — Other Ambulatory Visit: Payer: Medicare Other

## 2023-01-18 ENCOUNTER — Other Ambulatory Visit: Payer: Self-pay | Admitting: Hematology and Oncology

## 2023-01-18 DIAGNOSIS — D46Z Other myelodysplastic syndromes: Secondary | ICD-10-CM

## 2023-01-19 ENCOUNTER — Inpatient Hospital Stay: Payer: Medicare Other | Attending: Nurse Practitioner

## 2023-01-19 ENCOUNTER — Other Ambulatory Visit: Payer: Self-pay

## 2023-01-19 ENCOUNTER — Ambulatory Visit: Payer: Medicare Other | Admitting: Podiatry

## 2023-01-19 ENCOUNTER — Inpatient Hospital Stay: Payer: Medicare Other | Admitting: Hematology and Oncology

## 2023-01-19 VITALS — BP 124/61 | HR 71 | Temp 97.7°F | Resp 16 | Wt 218.0 lb

## 2023-01-19 DIAGNOSIS — D539 Nutritional anemia, unspecified: Secondary | ICD-10-CM

## 2023-01-19 DIAGNOSIS — M2041 Other hammer toe(s) (acquired), right foot: Secondary | ICD-10-CM | POA: Diagnosis not present

## 2023-01-19 DIAGNOSIS — D696 Thrombocytopenia, unspecified: Secondary | ICD-10-CM | POA: Insufficient documentation

## 2023-01-19 DIAGNOSIS — D46A Refractory cytopenia with multilineage dysplasia: Secondary | ICD-10-CM | POA: Insufficient documentation

## 2023-01-19 DIAGNOSIS — E0843 Diabetes mellitus due to underlying condition with diabetic autonomic (poly)neuropathy: Secondary | ICD-10-CM | POA: Diagnosis not present

## 2023-01-19 DIAGNOSIS — L97512 Non-pressure chronic ulcer of other part of right foot with fat layer exposed: Secondary | ICD-10-CM | POA: Diagnosis not present

## 2023-01-19 DIAGNOSIS — D46Z Other myelodysplastic syndromes: Secondary | ICD-10-CM | POA: Diagnosis not present

## 2023-01-19 DIAGNOSIS — D708 Other neutropenia: Secondary | ICD-10-CM | POA: Diagnosis not present

## 2023-01-19 DIAGNOSIS — D72819 Decreased white blood cell count, unspecified: Secondary | ICD-10-CM | POA: Insufficient documentation

## 2023-01-19 DIAGNOSIS — Z87891 Personal history of nicotine dependence: Secondary | ICD-10-CM | POA: Insufficient documentation

## 2023-01-19 LAB — CMP (CANCER CENTER ONLY)
ALT: 18 U/L (ref 0–44)
AST: 21 U/L (ref 15–41)
Albumin: 4.3 g/dL (ref 3.5–5.0)
Alkaline Phosphatase: 65 U/L (ref 38–126)
Anion gap: 7 (ref 5–15)
BUN: 23 mg/dL (ref 8–23)
CO2: 28 mmol/L (ref 22–32)
Calcium: 9.2 mg/dL (ref 8.9–10.3)
Chloride: 105 mmol/L (ref 98–111)
Creatinine: 0.89 mg/dL (ref 0.44–1.00)
GFR, Estimated: >60 mL/min (ref >60)
Glucose, Bld: 105 mg/dL — ABNORMAL HIGH (ref 70–99)
Potassium: 4.6 mmol/L (ref 3.5–5.1)
Sodium: 140 mmol/L (ref 135–145)
Total Bilirubin: 0.8 mg/dL (ref 0.3–1.2)
Total Protein: 7 g/dL (ref 6.5–8.1)

## 2023-01-19 LAB — CBC WITH DIFFERENTIAL (CANCER CENTER ONLY)
Abs Immature Granulocytes: 0.04 10*3/uL (ref 0.00–0.07)
Basophils Absolute: 0 10*3/uL (ref 0.0–0.1)
Basophils Relative: 1 %
Eosinophils Absolute: 0.1 10*3/uL (ref 0.0–0.5)
Eosinophils Relative: 3 %
HCT: 35.8 % — ABNORMAL LOW (ref 36.0–46.0)
Hemoglobin: 12.2 g/dL (ref 12.0–15.0)
Immature Granulocytes: 1 %
Lymphocytes Relative: 41 %
Lymphs Abs: 1.2 10*3/uL (ref 0.7–4.0)
MCH: 32.9 pg (ref 26.0–34.0)
MCHC: 34.1 g/dL (ref 30.0–36.0)
MCV: 96.5 fL (ref 80.0–100.0)
Monocytes Absolute: 0.2 10*3/uL (ref 0.1–1.0)
Monocytes Relative: 7 %
Neutro Abs: 1.3 10*3/uL — ABNORMAL LOW (ref 1.7–7.7)
Neutrophils Relative %: 47 %
Platelet Count: 135 10*3/uL — ABNORMAL LOW (ref 150–400)
RBC: 3.71 MIL/uL — ABNORMAL LOW (ref 3.87–5.11)
RDW: 14.2 % (ref 11.5–15.5)
WBC Count: 2.9 10*3/uL — ABNORMAL LOW (ref 4.0–10.5)
nRBC: 0 % (ref 0.0–0.2)

## 2023-01-19 LAB — LACTATE DEHYDROGENASE: LDH: 184 U/L (ref 98–192)

## 2023-01-19 NOTE — Progress Notes (Signed)
No chief complaint on file.   SUBJECTIVE Patient with a history of diabetes mellitus presenting today for evaluation of a symptomatic callus to the medial aspect of the right fifth toe adjacent to fourth digit.  Patient states that since she was seen a few weeks ago she has had increased pain and tenderness to this area.  She says she does have a history of "soft corns" in this area.  Past Medical History:  Diagnosis Date   Anxiety    Arthritis    knee, hip   Cellulitis 08/2016   left leg   Complication of anesthesia    Diabetes mellitus without complication (HCC)    GERD (gastroesophageal reflux disease)    Heart murmur    "slight "  informed years ago.-    Hernia, umbilical    History of atrial fibrillation 2007   with flagyl   History of endometriosis    Hypercholesteremia    Hypertension    has not been to a cardiologist   PONV (postoperative nausea and vomiting)    Rectal vaginal fistula    Refusal of blood transfusions as patient is Jehovah's Witness    Sleep apnea     Allergies  Allergen Reactions   Other     Refuses whole blood but does accept albumin   Atorvastatin Other (See Comments)   Metronidazole Other (See Comments)    Chest pain, A-fib    Oxybutynin Other (See Comments)   Septra [Sulfamethoxazole-Trimethoprim] Other (See Comments)    Blisters      OBJECTIVE General Patient is awake, alert, and oriented x 3 and in no acute distress. Derm Skin is dry and supple bilateral.  Superficial ulcer noted to the medial aspect of the right fifth digit.  Please see above noted photo.  Granular wound base with good potential for healing.  Slight maceration.  No malodor. Vasc  DP and PT pedal pulses palpable bilaterally. Temperature gradient within normal limits.  Neuro Epicritic and protective threshold sensation diminished bilaterally.  Musculoskeletal Exam hammertoe deformity noted to the right fifth digit ASSESSMENT 1. Diabetes Mellitus w/ peripheral  neuropathy 2.  Ulcer medial aspect of the right fifth digit 3.  Hammertoe deformity right fifth digit  PLAN OF CARE 1. Patient evaluated today.  2.  Light excisional debridement using a tissue nipper was performed today to remove the necrotic nonviable tissue down to healthier tissue.  Excisional rim of necrotic nonviable tissue down to healthier viable tissue was performed with postdebridement measurement same as pre-.  It measures approximately 0.4 time 0.4 x 0.1 cm. 3.  Gelling fiber wound alginate was applied and provided for the patient to apply daily with a Band-Aid 4.  Advised against wearing closed toed compression hose. 5.  Open toed surgical shoe dispensed.  WBAT .  Return to clinic 2 weeks.  If the wound is resolved we can discuss possible hammertoe arthroplasty of the fifth digit right foot which will likely include a middle phalangectomy to allow for alignment of the toe and alleviation of pressure from the adjacent digit   Edrick Kins, DPM [facility]  Dr. Edrick Kins, DPM    [address], [place] [address]  Office [phone]  Fax [phone]

## 2023-01-19 NOTE — Progress Notes (Signed)
Kathleen [phone]   [phone]  PROGRESS NOTE  Patient Care Team: Jonathon Jordan, MD as PCP - General (Family Medicine) Stanford Breed Denice Bors, MD as PCP - Cardiology (Cardiology)  CHIEF COMPLAINTS/PURPOSE OF CONSULTATION:  Myelodysplastic Syndrome with Multilineage Dysplasia.   HISTORY OF PRESENTING ILLNESS:  Kathleen Wiley 77 y.o. female returns for a follow up for newly diagnosed MDS. She is unaccompanied for this visit.   On exam today, [name] reports she has had no major changes in her health in the interim since her last visit.  She reports that she does have a runny nose "all the time" with some clear drainage.  She reports it is "not gross".  She thinks it may be related some to allergies.  She does she is not having any headache, sinus pressure, or sore throat.  She also denies any nausea, vomiting, or diarrhea.  She has not noticed any overt signs of bleeding or bruising in the interim since her last visit.  She reports that she is able to do her day-to-day tasks without difficulty and her energy levels are adequate.  She is not having any lightheadedness, dizziness, or shortness of breath.  Overall she is at her baseline level of health with no questions comments or concerns today.  She denies fevers, chills, night sweat, shortness of breath, chest pain or cough. She has no other complaints. A full 10 point ROS is listed below.  MEDICAL HISTORY:  Past Medical History:  Diagnosis Date   Anxiety    Arthritis    knee, hip   Cellulitis 08/2016   left leg   Complication of anesthesia    Diabetes mellitus without complication (HCC)    GERD (gastroesophageal reflux disease)    Heart murmur    "slight "  informed years ago.-    Hernia, umbilical    History of atrial fibrillation 2007   with flagyl   History of endometriosis    Hypercholesteremia    Hypertension    has not been to a cardiologist   PONV (postoperative nausea and  vomiting)    Rectal vaginal fistula    Refusal of blood transfusions as patient is Jehovah's Witness    Sleep apnea     SURGICAL HISTORY: Past Surgical History:  Procedure Laterality Date   APPENDECTOMY     BUNIONECTOMY Right 01/09/2019   Procedure: RIGHT FOOT BUNION CORRECTION;  Surgeon: Erle Crocker, MD;  Location: [place];  Service: Orthopedics;  Laterality: Right;   CHOLECYSTECTOMY N/A 03/16/2021   Procedure: LAPAROSCOPIC CHOLECYSTECTOMY;  Surgeon: Stark Klein, MD;  [facility];  Service: General;  Laterality: N/A;   ENDOSCOPIC RETROGRADE CHOLANGIOPANCREATOGRAPHY (ERCP) WITH PROPOFOL N/A 03/15/2021   Procedure: ENDOSCOPIC RETROGRADE CHOLANGIOPANCREATOGRAPHY (ERCP) WITH PROPOFOL;  Surgeon: Ronnette Juniper, MD;  [facility];  Service: Gastroenterology;  Laterality: N/A;   EXPLORATORY LAPAROTOMY  1973   for endometrosis   HAMMERTOE RECONSTRUCTION WITH WEIL OSTEOTOMY Right 01/09/2019   Procedure: RIGHT FOOT 2-4 HAMMERTOE CORRECTION WITH WEIL OSTEOTOMY 2ND METATARSAL, DEEP TENDON TRANSFER WITH AKIN OSTEOTOMY;  Surgeon: Erle Crocker, MD;  Location: [place];  Service: Orthopedics;  Laterality: Right;   I & D EXTREMITY Right 06/10/2016   Procedure: IRRIGATION AND DEBRIDEMENT THUMB FLEXOR SHEATH;  Surgeon: Leanora Cover, MD;  Location: [place];  Service: Orthopedics;  Laterality: Right;   I & D EXTREMITY Right 04/23/2022   Procedure: RIGHT LEG DEBRIDEMENT;  Surgeon: Newt Minion, MD;  Location: [place];  Service: Orthopedics;  Laterality: Right;   JOINT REPLACEMENT     Left Hip   MANDIBLE SURGERY  1978   rectovaginal fistula repair  1980   REMOVAL OF STONES  03/15/2021   Procedure: REMOVAL OF STONES;  Surgeon: Ronnette Juniper, MD;  Location: [facility];  Service: Gastroenterology;;   Joan Mayans  03/15/2021   Procedure: Joan Mayans;  Surgeon: Ronnette Juniper, MD;  Location: [facility];  Service: Gastroenterology;;   TOTAL HIP ARTHROPLASTY  05/08/2012   Procedure: TOTAL HIP  ARTHROPLASTY;  Surgeon: Kerin Salen, MD;  Location: [facility];  Service: Orthopedics;  Laterality: Left;   TOTAL HIP ARTHROPLASTY Right 09/18/2018   Procedure: TOTAL HIP ARTHROPLASTY ANTERIOR APPROACH;  Surgeon: Frederik Pear, MD;  [facility];  Service: Orthopedics;  Laterality: Right;   TOTAL KNEE ARTHROPLASTY Right 01/24/2013   Dr Mayer Camel   TOTAL KNEE ARTHROPLASTY Right 01/24/2013   Procedure: RIGHT TOTAL KNEE ARTHROPLASTY;  Surgeon: Kerin Salen, MD;  Location: [place];  Service: Orthopedics;  Laterality: Right;    SOCIAL HISTORY: Social History   Socioeconomic History   Marital status: Married    Spouse name: Not on file   Number of children: 1   Years of education: Not on file   Highest education level: Not on file  Occupational History   Not on file  Tobacco Use   Smoking status: Former    Packs/day: 1.00    Years: 10.00    Additional pack years: 0.00    Total pack years: 10.00    Types: Cigarettes    Quit date: 11/08/1981    Years since quitting: 41.2   Smokeless tobacco: Never  Vaping Use   Vaping Use: Never used  Substance and Sexual Activity   Alcohol use: No   Drug use: No   Sexual activity: Not on file  Other Topics Concern   Not on file  Social History Narrative   Not on file   Social Determinants of Health   Financial Resource Strain: Not on file  Food Insecurity: Not on file  Transportation Needs: Not on file  Physical Activity: Not on file  Stress: Not on file  Social Connections: Not on file  Intimate Partner Violence: Not on file    FAMILY HISTORY: Family History  Problem Relation Age of Onset   Dementia Mother    Cancer Father    Breast cancer Neg Hx     ALLERGIES:  is allergic to other, atorvastatin, metronidazole, oxybutynin, and septra [sulfamethoxazole-trimethoprim].  MEDICATIONS:  Current Outpatient Medications  Medication Sig Dispense Refill   acetaminophen (TYLENOL) 325 MG tablet Take 2 tablets (650 mg total) by mouth every 6  (six) hours as needed. (Patient not taking: Reported on 07/13/2022)     bismuth subsalicylate (PEPTO BISMOL) 262 MG/15ML suspension Take 30 mLs by mouth every 6 (six) hours as needed.     Cholecalciferol (VITAMIN D) 50 MCG (2000 UT) tablet Take 2,000 Units by mouth daily.      cyanocobalamin 1000 MCG tablet Take 1,000 mcg by mouth daily.      escitalopram (LEXAPRO) 20 MG tablet Take 20 mg by mouth daily.     fluticasone (FLONASE) 50 MCG/ACT nasal spray Place 2 sprays into both nostrils daily as needed for allergies.     furosemide (LASIX) 20 MG tablet Take 20 mg by mouth daily.     gabapentin (NEURONTIN) 300 MG capsule Take 300 mg by mouth 3 (three) times daily.     ibuprofen (ADVIL) 200 MG  tablet Take 400 mg by mouth 2 (two) times daily.     metoprolol succinate (TOPROL XL) 25 MG 24 hr tablet Take 0.5 tablets (12.5 mg total) by mouth daily.     omeprazole (PRILOSEC) 20 MG capsule Take 20 mg by mouth 2 (two) times daily before a meal.     OZEMPIC, 1 MG/DOSE, 4 MG/3ML SOPN Inject 2 mg into the skin once a week.     potassium chloride (KLOR-CON) 10 MEQ tablet Take 10 mEq by mouth daily.     rosuvastatin (CRESTOR) 5 MG tablet Take 5 mg by mouth at bedtime.     No current facility-administered medications for this visit.    REVIEW OF SYSTEMS:   Constitutional: ( - ) fevers, ( - )  chills , ( - ) night sweats Eyes: ( - ) blurriness of vision, ( - ) double vision, ( - ) watery eyes Ears, nose, mouth, throat, and face: ( - ) mucositis, ( - ) sore throat Respiratory: ( - ) cough, ( - ) dyspnea, ( - ) wheezes Cardiovascular: ( - ) palpitation, ( - ) chest discomfort, ( - ) lower extremity swelling Gastrointestinal:  ( - ) nausea, ( - ) heartburn, ( - ) change in bowel habits Skin: ( - ) abnormal skin rashes Lymphatics: ( - ) new lymphadenopathy, ( - ) easy bruising Neurological: ( - ) numbness, ( - ) tingling, ( - ) new weaknesses Behavioral/Psych: ( - ) mood change, ( - ) new changes  All other  systems were reviewed with the patient and are negative.  PHYSICAL EXAMINATION:  Vitals:   01/19/23 0954  BP: 124/61  Pulse: 71  Resp: 16  Temp: 97.7 F (36.5 C)  SpO2: 97%   Filed Weights   01/19/23 0954  Weight: 218 lb (98.9 kg)    GENERAL: well appearing elderly Caucasian female in NAD  SKIN: skin color, texture, turgor are normal, no rashes or significant lesions EYES: conjunctiva are pink and non-injected, sclera clear LUNGS: clear to auscultation and percussion with normal breathing effort HEART: regular rate & rhythm and no murmurs. Bilateral lower extremity edema.  Musculoskeletal: no cyanosis of digits and no clubbing  PSYCH: alert & oriented x 3, fluent speech NEURO: no focal motor/sensory deficits  LABORATORY DATA:  I have reviewed the data as listed    Latest Ref Rng & Units 01/19/2023    9:34 AM 07/13/2022    2:33 PM 07/01/2022    9:40 AM  CBC  WBC 4.0 - 10.5 K/uL 2.9  3.3  2.9   Hemoglobin 12.0 - 15.0 g/dL 12.2  12.5  11.3   Hematocrit 36.0 - 46.0 % 35.8  36.5  34.3   Platelets 150 - 400 K/uL 135  153  144        Latest Ref Rng & Units 01/19/2023    9:34 AM 07/13/2022    2:33 PM 06/04/2022    2:02 PM  CMP  Glucose 70 - 99 mg/dL 105  132  115   BUN 8 - 23 mg/dL 23  16  13    Creatinine 0.44 - 1.00 mg/dL 0.89  0.76  0.65   Sodium 135 - 145 mmol/L 140  139  139   Potassium 3.5 - 5.1 mmol/L 4.6  4.1  4.2   Chloride 98 - 111 mmol/L 105  105  105   CO2 22 - 32 mmol/L 28  27  27    Calcium 8.9 - 10.3 mg/dL 9.2  9.6  9.0   Total Protein 6.5 - 8.1 g/dL 7.0  7.2  7.0   Total Bilirubin 0.3 - 1.2 mg/dL 0.8  0.8  0.8   Alkaline Phos 38 - 126 U/L 65  73  71   AST 15 - 41 U/L 21  28  21    ALT 0 - 44 U/L 18  29  14       ASSESSMENT & PLAN Maceo Pro 77 y.o. female with medical history significant for  newly diagnosed myelodysplastic syndrome.   #Myelodysplastic Syndrome-Low grade: # Thrombocytopenia # Leukopenia/Neutropenia --Bone marrow biopsy form  07/01/2022 showed hypercellular bone marrow (75%) with erythroid and megakaryocytic dysplasia consistent with myelodysplastic syndrome with multilineage dysplasia  --Labs today show WBC 2.9, Hgb 12.2, MCV 96.5, Plt 135 --No indication to start therapy at this time --Recommend to monitor at this time. --Return in 12 months with labs  and follow up visit   No orders of the defined types were placed in this encounter.   All questions were answered. The patient knows to call the clinic with any problems, questions or concerns.  I have spent a total of 30 minutes minutes of face-to-face and non-face-to-face time, preparing to see the [place] a medically appropriate examination, counseling and educating the patient,documenting clinical information in the electronic health record,  and care coordination.   Ledell Peoples, MD Department of Hematology/Oncology [place] [facility] Phone: [phone] Pager: [phone] Email: [email or web address]  01/27/2023 8:56 AM

## 2023-01-24 ENCOUNTER — Ambulatory Visit: Payer: Medicare Other | Admitting: Podiatry

## 2023-02-02 ENCOUNTER — Ambulatory Visit: Payer: Medicare Other | Admitting: Podiatry

## 2023-02-02 DIAGNOSIS — L97512 Non-pressure chronic ulcer of other part of right foot with fat layer exposed: Secondary | ICD-10-CM | POA: Diagnosis not present

## 2023-02-02 NOTE — Progress Notes (Signed)
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  Patient states that there has been improvement.  She is no longer wearing her close toed compression hose.  Patient is also requesting to have her left hallux nail plate evaluated today.  She says it is thick and she is concerned that it is loosely adhered and may fall off.  Past Medical History:  Diagnosis Date   Anxiety    Arthritis    knee, hip   Cellulitis 08/2016   left leg   Complication of anesthesia    Diabetes mellitus without complication (HCC)    GERD (gastroesophageal reflux disease)    Heart murmur    "slight "  informed years ago.-    Hernia, umbilical    History of atrial fibrillation 2007   with flagyl   History of endometriosis    Hypercholesteremia    Hypertension    has not been to a cardiologist   PONV (postoperative nausea and vomiting)    Rectal vaginal fistula    Refusal of blood transfusions as patient is Jehovah's Witness    Sleep apnea     Allergies  Allergen Reactions   Other     Refuses whole blood but does accept albumin   Atorvastatin Other (See Comments)   Metronidazole Other (See Comments)    Chest pain, A-fib    Oxybutynin Other (See Comments)   Septra [Sulfamethoxazole-Trimethoprim] Other (See Comments)    Blisters     RT fifth toe 01/19/2023  OBJECTIVE General Patient is awake, alert, and oriented x 3 and in no acute distress. Derm Skin is dry and supple bilateral.  The ulcer to the medial aspect of the right fifth digit appears to be healed.  No open wound noted after debridement.  There was some loosely adhered skin which was debrided and there is no indication of an ulcer.  Hyperkeratotic dystrophic nail also noted to the left  hallux nail plate Vasc slight delayed capillary refill but skin is cool to touch.  Neuro grossly intact via light touch Musculoskeletal Exam hammertoe deformity noted to the right fifth digit  ASSESSMENT 1. Diabetes Mellitus w/ peripheral neuropathy 2.  Ulcer medial aspect of the right fifth digit; resolved 3.  Hammertoe deformity right fifth digit 4.  Pain due to onychomycosis of toenail left hallux nail plate  -Patient evaluated -Light debridement of the area was performed today using a tissue nipper.  Ulcer healed -Recommend open toed compression hose -Continue wearing wide fitting shoes or open toe sandals that do not constrict the toebox area -Light debridement of the left hallux nail plate was performed today using nail nipper as a courtesy for the patient -Return to clinic as needed  Edrick Kins, DPM [facility]  Dr. Edrick Kins, DPM    [address], [place] [address]                Office [phone]  Fax [phone]

## 2023-03-15 DIAGNOSIS — H25812 Combined forms of age-related cataract, left eye: Secondary | ICD-10-CM | POA: Diagnosis not present

## 2023-03-15 DIAGNOSIS — H25811 Combined forms of age-related cataract, right eye: Secondary | ICD-10-CM | POA: Diagnosis not present

## 2023-03-30 DIAGNOSIS — H25812 Combined forms of age-related cataract, left eye: Secondary | ICD-10-CM | POA: Diagnosis not present

## 2023-04-13 DIAGNOSIS — H2511 Age-related nuclear cataract, right eye: Secondary | ICD-10-CM | POA: Diagnosis not present

## 2023-04-13 DIAGNOSIS — H25811 Combined forms of age-related cataract, right eye: Secondary | ICD-10-CM | POA: Diagnosis not present

## 2023-05-06 ENCOUNTER — Other Ambulatory Visit: Payer: Self-pay | Admitting: Podiatry

## 2023-05-06 ENCOUNTER — Ambulatory Visit: Payer: Medicare Other | Admitting: Podiatry

## 2023-05-06 ENCOUNTER — Ambulatory Visit (INDEPENDENT_AMBULATORY_CARE_PROVIDER_SITE_OTHER): Payer: Medicare Other

## 2023-05-06 DIAGNOSIS — L97512 Non-pressure chronic ulcer of other part of right foot with fat layer exposed: Secondary | ICD-10-CM

## 2023-05-06 DIAGNOSIS — M2011 Hallux valgus (acquired), right foot: Secondary | ICD-10-CM | POA: Diagnosis not present

## 2023-05-06 DIAGNOSIS — E0843 Diabetes mellitus due to underlying condition with diabetic autonomic (poly)neuropathy: Secondary | ICD-10-CM

## 2023-05-06 DIAGNOSIS — R52 Pain, unspecified: Secondary | ICD-10-CM

## 2023-05-06 NOTE — Progress Notes (Unsigned)
Chief Complaint  Patient presents with   Diabetes    Patient  came in today for right foot ulcer 2nd toe, started 2 weeks ago, no drainage, neuropathy, shoot sharp pain, A1c- 5.0 BG- 102     Subjective:  77 y.o. female with PMHx of diabetes mellitus presenting today for evaluation of an ulcer to the plantar aspect of the right second toe.  Patient states that her right second toe is plantarflexed and her great toe sits on top of her second toe increasing pressure to this area.  She has had a wound to the second toe intermittently but most recently it began about 2 weeks ago.  No drainage.  Presenting for further treatment and evaluation.   Past Medical History:  Diagnosis Date   Anxiety    Arthritis    knee, hip   Cellulitis 08/2016   left leg   Complication of anesthesia    Diabetes mellitus without complication (HCC)    GERD (gastroesophageal reflux disease)    Heart murmur    "slight "  informed years ago.-    Hernia, umbilical    History of atrial fibrillation 2007   with flagyl   History of endometriosis    Hypercholesteremia    Hypertension    has not been to a cardiologist   PONV (postoperative nausea and vomiting)    Rectal vaginal fistula    Refusal of blood transfusions as patient is Jehovah's Witness    Sleep apnea     Past Surgical History:  Procedure Laterality Date   APPENDECTOMY     BUNIONECTOMY Right 01/09/2019   Procedure: RIGHT FOOT BUNION CORRECTION;  Surgeon: Terance Hart, MD;  Location: [facility];  Service: Orthopedics;  Laterality: Right;   CHOLECYSTECTOMY N/A 03/16/2021   Procedure: LAPAROSCOPIC CHOLECYSTECTOMY;  Surgeon: Almond Lint, MD;  [facility];  Service: General;  Laterality: N/A;   ENDOSCOPIC RETROGRADE CHOLANGIOPANCREATOGRAPHY (ERCP) WITH PROPOFOL N/A 03/15/2021   Procedure: ENDOSCOPIC RETROGRADE CHOLANGIOPANCREATOGRAPHY (ERCP) WITH PROPOFOL;  Surgeon: Kerin Salen, MD;  [facility];  Service: Gastroenterology;   Laterality: N/A;   EXPLORATORY LAPAROTOMY  1973   for endometrosis   HAMMERTOE RECONSTRUCTION WITH WEIL OSTEOTOMY Right 01/09/2019   Procedure: RIGHT FOOT 2-4 HAMMERTOE CORRECTION WITH WEIL OSTEOTOMY 2ND METATARSAL, DEEP TENDON TRANSFER WITH AKIN OSTEOTOMY;  Surgeon: Terance Hart, MD;  Location: [facility];  Service: Orthopedics;  Laterality: Right;   I & D EXTREMITY Right 06/10/2016   Procedure: IRRIGATION AND DEBRIDEMENT THUMB FLEXOR SHEATH;  Surgeon: Betha Loa, MD;  Location: [facility];  Service: Orthopedics;  Laterality: Right;   I & D EXTREMITY Right 04/23/2022   Procedure: RIGHT LEG DEBRIDEMENT;  Surgeon: Nadara Mustard, MD;  Location: [facility];  Service: Orthopedics;  Laterality: Right;   JOINT REPLACEMENT     Left Hip   MANDIBLE SURGERY  1978   rectovaginal fistula repair  1980   REMOVAL OF STONES  03/15/2021   Procedure: REMOVAL OF STONES;  Surgeon: Kerin Salen, MD;  Location: [facility];  Service: Gastroenterology;;   Dennison Mascot  03/15/2021   Procedure: Dennison Mascot;  Surgeon: Kerin Salen, MD;  Location: [facility];  Service: Gastroenterology;;   TOTAL HIP ARTHROPLASTY  05/08/2012   Procedure: TOTAL HIP ARTHROPLASTY;  Surgeon: [name], MD;  Location: [facility];  Service: Orthopedics;  Laterality: Left;   TOTAL HIP ARTHROPLASTY Right 09/18/2018   Procedure: TOTAL HIP ARTHROPLASTY ANTERIOR APPROACH;  Surgeon: Gean Birchwood, MD;  [facility];  Service: Orthopedics;  Laterality: Right;   TOTAL KNEE ARTHROPLASTY Right 01/24/2013   Dr Turner Daniels   TOTAL KNEE ARTHROPLASTY Right 01/24/2013   Procedure: RIGHT TOTAL KNEE ARTHROPLASTY;  Surgeon: [name], MD;  Location: [facility] OR;  Service: Orthopedics;  Laterality: Right;    Allergies  Allergen Reactions   Other     Refuses whole blood but does accept albumin   Atorvastatin Other (See Comments)   Metronidazole Other (See Comments)    Chest pain, A-fib    Oxybutynin Other (See Comments)   Septra [Sulfamethoxazole-Trimethoprim] Other  (See Comments)    Blisters      RT second toe 05/06/2023  Objective/Physical Exam General: The patient is alert and oriented x3 in no acute distress.  Dermatology:  Wound #1 noted to the plantar aspect of the right second toe measuring approximately 0.5 x 0.5 x 0.2 cm (LxWxD).   To the noted ulceration(s), there is no eschar. There is a moderate amount of slough, fibrin, and necrotic tissue noted. Granulation tissue and wound base is red. There is a minimal amount of serosanguineous drainage noted. There is no exposed bone muscle-tendon ligament or joint. There is no malodor. Periwound integrity is intact. Skin is warm, dry and supple bilateral lower extremities.  Vascular: Palpable pedal pulses bilaterally. No edema or erythema noted. Capillary refill within normal limits.  Clinically there is no concern for vascular compromise or a deeper underlying cellulitis of the foot  Neurological: Light touch and protective threshold diminished bilaterally.   Musculoskeletal Exam: Range of motion within normal limits to all pedal and ankle joints bilateral. Muscle strength 5/5 in all groups bilateral.  No prior amputations.  Patient is ambulatory.  Residual hallux valgus deformity noted to the right foot  Radiographic exam RT foot 04/28/2023: Orthopedic screws x 3 noted to the distal portion of the first metatarsal of the right foot as well as the base of the proximal phalanx from prior bunion surgery.  Also there is an orthopedic screw noted to the head of the second metatarsal.  Orthopedic hardware is intact.  Normal osseous mineralization.  No acute fractures or instability noted from prior surgery.  Residual hallux valgus noted with a persistent increased intermetatarsal angle and hallux abductus angle.  Assessment: 1.  Ulcer plantar aspect of the right second toe secondary to diabetes mellitus 2. diabetes mellitus w/ peripheral neuropathy 3.  Recurrent hallux valgus right foot 4.  History of  bunionectomy right foot   Plan of Care:  1. Patient was evaluated.  Patient's medical history reviewed. 2. medically necessary excisional debridement including subcutaneous tissue was performed using a tissue nipper and a chisel blade. Excisional debridement of all the necrotic nonviable tissue down to healthy bleeding viable tissue was performed with post-debridement measurements same as pre-. 3.  Recommend triple antibiotic and a Band-Aid daily 4.  Postsurgical shoe dispensed.  WBAT 5.  Return to clinic 3 weeks.  At this time we may need to discuss arthrodesis of the right great toe joint to better align the toe and alleviate pressure from the second digit   Felecia Shelling, DPM [facility]  Dr. Felecia Shelling, DPM    [address], [place] [address]  Office [phone]  Fax [phone]

## 2023-06-01 ENCOUNTER — Ambulatory Visit: Payer: Medicare Other | Admitting: Podiatry

## 2023-06-01 VITALS — BP 128/62 | Temp 97.5°F

## 2023-06-01 DIAGNOSIS — M2041 Other hammer toe(s) (acquired), right foot: Secondary | ICD-10-CM | POA: Diagnosis not present

## 2023-06-01 DIAGNOSIS — L97512 Non-pressure chronic ulcer of other part of right foot with fat layer exposed: Secondary | ICD-10-CM

## 2023-06-01 DIAGNOSIS — M2011 Hallux valgus (acquired), right foot: Secondary | ICD-10-CM | POA: Diagnosis not present

## 2023-06-01 DIAGNOSIS — E0843 Diabetes mellitus due to underlying condition with diabetic autonomic (poly)neuropathy: Secondary | ICD-10-CM | POA: Diagnosis not present

## 2023-06-01 NOTE — Progress Notes (Signed)
No chief complaint on file.   Subjective:  77 y.o. female with PMHx of diabetes mellitus presenting today for follow-up evaluation of an ulcer to the plantar aspect of the right second toe.  Overall patient is doing better.  She has been applying antibiotic ointment and a Band-Aid daily to the area.  Overall improvement.  Brief history: History of right forefoot reconstructive surgery by Dr. Dub Mikes, [facility].  Patient states that her right second toe is plantarflexed and her great toe sits on top of her second toe increasing pressure to this area.  She has had a wound to the second toe intermittently  Past Medical History:  Diagnosis Date   Anxiety    Arthritis    knee, hip   Cellulitis 08/2016   left leg   Complication of anesthesia    Diabetes mellitus without complication (HCC)    GERD (gastroesophageal reflux disease)    Heart murmur    "slight "  informed years ago.-    Hernia, umbilical    History of atrial fibrillation 2007   with flagyl   History of endometriosis    Hypercholesteremia    Hypertension    has not been to a cardiologist   PONV (postoperative nausea and vomiting)    Rectal vaginal fistula    Refusal of blood transfusions as patient is Jehovah's Witness    Sleep apnea     Past Surgical History:  Procedure Laterality Date   APPENDECTOMY     BUNIONECTOMY Right 01/09/2019   Procedure: RIGHT FOOT BUNION CORRECTION;  Surgeon: Terance Hart, MD;  Location: [facility] OR;  Service: Orthopedics;  Laterality: Right;   CHOLECYSTECTOMY N/A 03/16/2021   Procedure: LAPAROSCOPIC CHOLECYSTECTOMY;  Surgeon: Almond Lint, MD;  Location: [facility] ORS;  Service: General;  Laterality: N/A;   ENDOSCOPIC RETROGRADE CHOLANGIOPANCREATOGRAPHY (ERCP) WITH PROPOFOL N/A 03/15/2021   Procedure: ENDOSCOPIC RETROGRADE CHOLANGIOPANCREATOGRAPHY (ERCP) WITH PROPOFOL;  Surgeon: Kerin Salen, MD;  Location: [facility];  Service: Gastroenterology;  Laterality: N/A;    EXPLORATORY LAPAROTOMY  1973   for endometrosis   HAMMERTOE RECONSTRUCTION WITH WEIL OSTEOTOMY Right 01/09/2019   Procedure: RIGHT FOOT 2-4 HAMMERTOE CORRECTION WITH WEIL OSTEOTOMY 2ND METATARSAL, DEEP TENDON TRANSFER WITH AKIN OSTEOTOMY;  Surgeon: Terance Hart, MD;  Location: [facility] OR;  Service: Orthopedics;  Laterality: Right;   I & D EXTREMITY Right 06/10/2016   Procedure: IRRIGATION AND DEBRIDEMENT THUMB FLEXOR SHEATH;  Surgeon: Betha Loa, MD;  Location: [facility] OR;  Service: Orthopedics;  Laterality: Right;   I & D EXTREMITY Right 04/23/2022   Procedure: RIGHT LEG DEBRIDEMENT;  Surgeon: Nadara Mustard, MD;  Location: [facility] OR;  Service: Orthopedics;  Laterality: Right;   JOINT REPLACEMENT     Left Hip   MANDIBLE SURGERY  1978   rectovaginal fistula repair  1980   REMOVAL OF STONES  03/15/2021   Procedure: REMOVAL OF STONES;  Surgeon: Kerin Salen, MD;  Location: [facility];  Service: Gastroenterology;;   Dennison Mascot  03/15/2021   Procedure: Dennison Mascot;  Surgeon: Kerin Salen, MD;  Location: [facility];  Service: Gastroenterology;;   TOTAL HIP ARTHROPLASTY  05/08/2012   Procedure: TOTAL HIP ARTHROPLASTY;  Surgeon: [name], MD;  Location: [facility] OR;  Service: Orthopedics;  Laterality: Left;   TOTAL HIP ARTHROPLASTY Right 09/18/2018   Procedure: TOTAL HIP ARTHROPLASTY ANTERIOR APPROACH;  Surgeon: Gean Birchwood, MD;  Location: [facility];  Service: Orthopedics;  Laterality: Right;   TOTAL KNEE ARTHROPLASTY Right 01/24/2013   Dr Turner Daniels  TOTAL KNEE ARTHROPLASTY Right 01/24/2013   Procedure: RIGHT TOTAL KNEE ARTHROPLASTY;  Surgeon: [name], MD;  Location: [facility] OR;  Service: Orthopedics;  Laterality: Right;    Allergies  Allergen Reactions   Other     Refuses whole blood but does accept albumin   Atorvastatin Other (See Comments)   Metronidazole Other (See Comments)    Chest pain, A-fib    Oxybutynin Other (See Comments)   Septra [Sulfamethoxazole-Trimethoprim] Other (See Comments)     Blisters      RT second toe 05/06/2023  Objective/Physical Exam General: The patient is alert and oriented x3 in no acute distress.  Dermatology:  Wound to the plantar aspect of the right second toe measures approximately 0.2 x 0.2 to 0.1 cm.  Overall significant improvement.  Pinpoint granular tissue noted within the central portion of the wound.  No drainage.  No erythema.  Vascular: Palpable pedal pulses bilaterally. No edema or erythema noted. Capillary refill within normal limits.  Clinically there is no concern for vascular compromise or a deeper underlying cellulitis of the foot  Neurological: Grossly intact via light touch.   Musculoskeletal Exam: Range of motion within normal limits to all pedal and ankle joints bilateral. Muscle strength 5/5 in all groups bilateral.  No prior amputations.  Patient is ambulatory.  Residual hallux valgus deformity noted to the right foot  Radiographic exam RT foot 04/28/2023: Orthopedic screws x 3 noted to the distal portion of the first metatarsal of the right foot as well as the base of the proximal phalanx from prior bunion surgery.  Also there is an orthopedic screw noted to the head of the second metatarsal.  Orthopedic hardware is intact.  Normal osseous mineralization.  No acute fractures or instability noted from prior surgery.  Residual hallux valgus noted with a persistent increased intermetatarsal angle and hallux abductus angle.  Assessment: 1.  Ulcer plantar aspect of the right second toe secondary to diabetes mellitus 2. diabetes mellitus w/ peripheral neuropathy 3.  Recurrent hallux valgus right foot 4.  History of bunionectomy right foot   Plan of Care:  1. Patient was evaluated.  X-rays reviewed again today 2. medically necessary excisional debridement including subcutaneous tissue was performed using a tissue nipper and a chisel blade. Excisional debridement of all the necrotic nonviable tissue down to healthy bleeding viable  tissue was performed with post-debridement measurements same as pre-. 3.  Continue triple antibiotic and a Band-Aid daily for the next 1 to 2 weeks until the ulcer is completely resolved 4.  Continue WBAT surgical shoe.  Patient may begin to slowly transition out of the postsurgical shoe into good supportive tennis shoes and sneakers  5.  Today again we had a discussion regarding the poor outcome of her elective forefoot surgery.  Unfortunately this is affecting her on a daily basis and she is unable to wear shoes or ambulate without pain and tenderness.  We did discuss revisional forefoot reconstructive surgery which would include a first MTP arthrodesis, possible hammertoe repair second digit, and hammertoe repair of the fifth digit.  Procedure was explained in detail to the patient at great length.  She would like to discuss with her husband 6.  Return to clinic as needed, for surgical consult  Felecia Shelling, DPM [facility]  Dr. Felecia Shelling, DPM    [address]  [place], [place] [address]                Office [phone]  Fax [phone]

## 2023-07-14 DIAGNOSIS — Z23 Encounter for immunization: Secondary | ICD-10-CM | POA: Diagnosis not present

## 2023-07-14 DIAGNOSIS — K7460 Unspecified cirrhosis of liver: Secondary | ICD-10-CM | POA: Diagnosis not present

## 2023-07-14 DIAGNOSIS — E1122 Type 2 diabetes mellitus with diabetic chronic kidney disease: Secondary | ICD-10-CM | POA: Diagnosis not present

## 2023-07-14 DIAGNOSIS — E559 Vitamin D deficiency, unspecified: Secondary | ICD-10-CM | POA: Diagnosis not present

## 2023-07-15 ENCOUNTER — Other Ambulatory Visit: Payer: Self-pay | Admitting: Family Medicine

## 2023-07-15 DIAGNOSIS — K7460 Unspecified cirrhosis of liver: Secondary | ICD-10-CM

## 2023-07-26 ENCOUNTER — Other Ambulatory Visit: Payer: Medicare Other

## 2023-07-29 ENCOUNTER — Ambulatory Visit
Admission: RE | Admit: 2023-07-29 | Discharge: 2023-07-29 | Disposition: A | Discharge status: Home / Self Care | Payer: Medicare Other | Source: Ambulatory Visit | Attending: Family Medicine | Admitting: Family Medicine

## 2023-07-29 DIAGNOSIS — K769 Liver disease, unspecified: Secondary | ICD-10-CM | POA: Diagnosis not present

## 2023-07-29 DIAGNOSIS — K7460 Unspecified cirrhosis of liver: Secondary | ICD-10-CM

## 2023-08-03 ENCOUNTER — Encounter: Payer: Self-pay | Admitting: Podiatry

## 2023-08-03 ENCOUNTER — Ambulatory Visit: Payer: Medicare Other | Admitting: Podiatry

## 2023-08-03 VITALS — Ht 66.0 in | Wt 218.0 lb

## 2023-08-03 DIAGNOSIS — M2011 Hallux valgus (acquired), right foot: Secondary | ICD-10-CM

## 2023-08-03 NOTE — Progress Notes (Signed)
Chief Complaint  Patient presents with   Toe Pain    diabetic foot swollen/ toe pain    Subjective:  77 y.o. female with PMHx of diabetes mellitus presenting today for follow-up evaluation of an ulcer to the plantar aspect of the right second toe.  Overall patient is doing better.  She has been applying antibiotic ointment and a Band-Aid daily to the area.  Overall improvement.  Brief history: History of right forefoot reconstructive surgery by Dr. Dub Mikes, [facility].  Patient states that her right second toe is plantarflexed and her great toe sits on top of her second toe increasing pressure to this area.  She has had a wound to the second toe intermittently  Past Medical History:  Diagnosis Date   Anxiety    Arthritis    knee, hip   Cellulitis 08/2016   left leg   Complication of anesthesia    Diabetes mellitus without complication (HCC)    GERD (gastroesophageal reflux disease)    Heart murmur    "slight "  informed years ago.-    Hernia, umbilical    History of atrial fibrillation 2007   with flagyl   History of endometriosis    Hypercholesteremia    Hypertension    has not been to a cardiologist   PONV (postoperative nausea and vomiting)    Rectal vaginal fistula    Refusal of blood transfusions as patient is Jehovah's Witness    Sleep apnea     Past Surgical History:  Procedure Laterality Date   APPENDECTOMY     BUNIONECTOMY Right 01/09/2019   Procedure: RIGHT FOOT BUNION CORRECTION;  Surgeon: Terance Hart, MD;  Location: [facility] OR;  Service: Orthopedics;  Laterality: Right;   CHOLECYSTECTOMY N/A 03/16/2021   Procedure: LAPAROSCOPIC CHOLECYSTECTOMY;  Surgeon: Almond Lint, MD;  Location: [facility];  Service: General;  Laterality: N/A;   ENDOSCOPIC RETROGRADE CHOLANGIOPANCREATOGRAPHY (ERCP) WITH PROPOFOL N/A 03/15/2021   Procedure: ENDOSCOPIC RETROGRADE CHOLANGIOPANCREATOGRAPHY (ERCP) WITH PROPOFOL;  Surgeon: Kerin Salen, MD;  Location: [facility];  Service: Gastroenterology;  Laterality: N/A;   EXPLORATORY LAPAROTOMY  1973   for endometrosis   HAMMERTOE RECONSTRUCTION WITH WEIL OSTEOTOMY Right 01/09/2019   Procedure: RIGHT FOOT 2-4 HAMMERTOE CORRECTION WITH WEIL OSTEOTOMY 2ND METATARSAL, DEEP TENDON TRANSFER WITH AKIN OSTEOTOMY;  Surgeon: Terance Hart, MD;  Location: [facility] OR;  Service: Orthopedics;  Laterality: Right;   I & D EXTREMITY Right 06/10/2016   Procedure: IRRIGATION AND DEBRIDEMENT THUMB FLEXOR SHEATH;  Surgeon: Betha Loa, MD;  Location: [facility] OR;  Service: Orthopedics;  Laterality: Right;   I & D EXTREMITY Right 04/23/2022   Procedure: RIGHT LEG DEBRIDEMENT;  Surgeon: Nadara Mustard, MD;  Location: [facility] OR;  Service: Orthopedics;  Laterality: Right;   JOINT REPLACEMENT     Left Hip   MANDIBLE SURGERY  1978   rectovaginal fistula repair  1980   REMOVAL OF STONES  03/15/2021   Procedure: REMOVAL OF STONES;  Surgeon: Kerin Salen, MD;  Location: [facility];  Service: Gastroenterology;;   Dennison Mascot  03/15/2021   Procedure: Dennison Mascot;  Surgeon: Kerin Salen, MD;  Location: [facility];  Service: Gastroenterology;;   TOTAL HIP ARTHROPLASTY  05/08/2012   Procedure: TOTAL HIP ARTHROPLASTY;  Surgeon: [name], MD;  Location: [facility] OR;  Service: Orthopedics;  Laterality: Left;   TOTAL HIP ARTHROPLASTY Right 09/18/2018   Procedure: TOTAL HIP ARTHROPLASTY ANTERIOR APPROACH;  Surgeon: Gean Birchwood, MD;  Location: [facility];  Service: Orthopedics;  Laterality: Right;   TOTAL KNEE ARTHROPLASTY Right 01/24/2013   Dr Turner Daniels   TOTAL KNEE ARTHROPLASTY Right 01/24/2013   Procedure: RIGHT TOTAL KNEE ARTHROPLASTY;  Surgeon: [name], MD;  Location: [facility] OR;  Service: Orthopedics;  Laterality: Right;    Allergies  Allergen Reactions   Other     Refuses whole blood but does accept albumin   Atorvastatin Other (See Comments)   Metronidazole Other (See Comments)    Chest pain, A-fib    Oxybutynin Other (See Comments)   Septra  [Sulfamethoxazole-Trimethoprim] Other (See Comments)    Blisters      RT second toe 05/06/2023  Objective/Physical Exam General: The patient is alert and oriented x3 in no acute distress.  Dermatology:  Overall the wound to the plantar aspect of the right second toe is significantly improved.  Pinpoint granular tissue noted within the central portion of the wound.  No drainage.  No erythema.  Stable  Vascular: Palpable pedal pulses bilaterally. No edema or erythema noted. Capillary refill within normal limits.  Clinically there is no concern for vascular compromise or a deeper underlying cellulitis of the foot  Neurological: Grossly intact via light touch.   Musculoskeletal Exam: Range of motion within normal limits to all pedal and ankle joints bilateral. Muscle strength 5/5 in all groups bilateral.  No prior amputations.  Patient is ambulatory.  Residual hallux valgus deformity noted to the right foot  Radiographic exam RT foot 04/28/2023: Orthopedic screws x 3 noted to the distal portion of the first metatarsal of the right foot as well as the base of the proximal phalanx from prior bunion surgery.  Also there is an orthopedic screw noted to the head of the second metatarsal.  Orthopedic hardware is intact.  Normal osseous mineralization.  No acute fractures or instability noted from prior surgery.  Residual hallux valgus noted with a persistent increased intermetatarsal angle and hallux abductus angle.  Assessment: 1.  Ulcer plantar aspect of the right second toe secondary to diabetes mellitus 2. diabetes mellitus w/ peripheral neuropathy 3.  Recurrent hallux valgus right foot 4.  History of bunionectomy right foot   Plan of Care:  -Patient was evaluated.  X-rays reviewed again today -Continue triple antibiotic and a Band-Aid daily for the next 1 to 2 weeks until the ulcer is completely resolved -Discontinue postsurgical shoe.  Recommend good supportive shoes and sneakers -Today  again we had a discussion regarding the poor outcome of her elective forefoot surgery.  Unfortunately this is affecting her on a daily basis and she is unable to wear shoes or ambulate without pain and tenderness.  We did discuss revisional forefoot reconstructive surgery which would include a first MTP arthrodesis, possible hammertoe repair second digit, and hammertoe repair of the fifth digit.  Procedure was explained in detail to the patient at great length.   -Return to clinic as needed  Felecia Shelling, DPM [facility]  Dr. Felecia Shelling, DPM    [address] [place], [place] [address]                Office [phone]  Fax [phone]

## 2023-09-19 DIAGNOSIS — R051 Acute cough: Secondary | ICD-10-CM | POA: Diagnosis not present

## 2023-09-19 DIAGNOSIS — J069 Acute upper respiratory infection, unspecified: Secondary | ICD-10-CM | POA: Diagnosis not present

## 2023-09-19 DIAGNOSIS — Z03818 Encounter for observation for suspected exposure to other biological agents ruled out: Secondary | ICD-10-CM | POA: Diagnosis not present

## 2023-10-20 DIAGNOSIS — M67912 Unspecified disorder of synovium and tendon, left shoulder: Secondary | ICD-10-CM | POA: Diagnosis not present

## 2023-11-03 DIAGNOSIS — I872 Venous insufficiency (chronic) (peripheral): Secondary | ICD-10-CM | POA: Diagnosis not present

## 2023-11-10 DIAGNOSIS — M25512 Pain in left shoulder: Secondary | ICD-10-CM | POA: Diagnosis not present

## 2023-11-14 DIAGNOSIS — R052 Subacute cough: Secondary | ICD-10-CM | POA: Diagnosis not present

## 2023-11-18 DIAGNOSIS — M25512 Pain in left shoulder: Secondary | ICD-10-CM | POA: Diagnosis not present

## 2023-11-29 DIAGNOSIS — M75112 Incomplete rotator cuff tear or rupture of left shoulder, not specified as traumatic: Secondary | ICD-10-CM | POA: Diagnosis not present

## 2023-11-29 DIAGNOSIS — M19012 Primary osteoarthritis, left shoulder: Secondary | ICD-10-CM | POA: Diagnosis not present

## 2023-12-05 DIAGNOSIS — M25512 Pain in left shoulder: Secondary | ICD-10-CM | POA: Diagnosis not present

## 2023-12-08 DIAGNOSIS — M75102 Unspecified rotator cuff tear or rupture of left shoulder, not specified as traumatic: Secondary | ICD-10-CM | POA: Diagnosis not present

## 2023-12-13 DIAGNOSIS — M75102 Unspecified rotator cuff tear or rupture of left shoulder, not specified as traumatic: Secondary | ICD-10-CM | POA: Diagnosis not present

## 2023-12-15 DIAGNOSIS — M75102 Unspecified rotator cuff tear or rupture of left shoulder, not specified as traumatic: Secondary | ICD-10-CM | POA: Diagnosis not present

## 2023-12-19 DIAGNOSIS — M75102 Unspecified rotator cuff tear or rupture of left shoulder, not specified as traumatic: Secondary | ICD-10-CM | POA: Diagnosis not present

## 2023-12-22 DIAGNOSIS — M75102 Unspecified rotator cuff tear or rupture of left shoulder, not specified as traumatic: Secondary | ICD-10-CM | POA: Diagnosis not present

## 2023-12-26 DIAGNOSIS — M75102 Unspecified rotator cuff tear or rupture of left shoulder, not specified as traumatic: Secondary | ICD-10-CM | POA: Diagnosis not present

## 2024-01-18 ENCOUNTER — Ambulatory Visit: Payer: Medicare Other | Admitting: Podiatry

## 2024-01-19 ENCOUNTER — Inpatient Hospital Stay: Payer: Medicare Other | Admitting: Hematology and Oncology

## 2024-01-19 ENCOUNTER — Other Ambulatory Visit: Payer: Self-pay | Admitting: Hematology and Oncology

## 2024-01-19 ENCOUNTER — Inpatient Hospital Stay: Payer: Medicare Other | Attending: Internal Medicine

## 2024-01-19 DIAGNOSIS — D46Z Other myelodysplastic syndromes: Secondary | ICD-10-CM

## 2024-01-19 NOTE — Progress Notes (Deleted)
 [facility] Telephone:(336) [phone]   [phone]  PROGRESS NOTE  Patient Care Team: Mila Palmer, MD as PCP - General (Family Medicine) Jens Som Madolyn Frieze, MD as PCP - Cardiology (Cardiology)  CHIEF COMPLAINTS/PURPOSE OF CONSULTATION:  Myelodysplastic Syndrome with Multilineage Dysplasia.   HISTORY OF PRESENTING ILLNESS:  Kathleen Wiley 78 y.o. female returns for a follow up for newly diagnosed MDS. She is unaccompanied for this visit.   On exam today, Kathleen Wiley reports she has had no major changes in her health in the interim since her last visit.  She reports that she does have a runny nose "all the time" with some clear drainage.  She reports it is "not gross".  She thinks it may be related some to allergies.  She does she is not having any headache, sinus pressure, or sore throat.  She also denies any nausea, vomiting, or diarrhea.  She has not noticed any overt signs of bleeding or bruising in the interim since her last visit.  She reports that she is able to do her day-to-day tasks without difficulty and her energy levels are adequate.  She is not having any lightheadedness, dizziness, or shortness of breath.  Overall she is at her baseline level of health with no questions comments or concerns today.  She denies fevers, chills, night sweat, shortness of breath, chest pain or cough. She has no other complaints. A full 10 point ROS is listed below.  MEDICAL HISTORY:  Past Medical History:  Diagnosis Date   Anxiety    Arthritis    knee, hip   Cellulitis 08/2016   left leg   Complication of anesthesia    Diabetes mellitus without complication (HCC)    GERD (gastroesophageal reflux disease)    Heart murmur    "slight "  informed years ago.-    Hernia, umbilical    History of atrial fibrillation 2007   with flagyl   History of endometriosis    Hypercholesteremia    Hypertension    has not been to a cardiologist   PONV (postoperative nausea and  vomiting)    Rectal vaginal fistula    Refusal of blood transfusions as patient is Jehovah's Witness    Sleep apnea     SURGICAL HISTORY: Past Surgical History:  Procedure Laterality Date   APPENDECTOMY     BUNIONECTOMY Right 01/09/2019   Procedure: RIGHT FOOT BUNION CORRECTION;  Surgeon: Terance Hart, MD;  Location: [facility] OR;  Service: Orthopedics;  Laterality: Right;   CHOLECYSTECTOMY N/A 03/16/2021   Procedure: LAPAROSCOPIC CHOLECYSTECTOMY;  Surgeon: Almond Lint, MD;  Location: [facility] ORS;  Service: General;  Laterality: N/A;   ENDOSCOPIC RETROGRADE CHOLANGIOPANCREATOGRAPHY (ERCP) WITH PROPOFOL N/A 03/15/2021   Procedure: ENDOSCOPIC RETROGRADE CHOLANGIOPANCREATOGRAPHY (ERCP) WITH PROPOFOL;  Surgeon: Kerin Salen, MD;  Location: [facility];  Service: Gastroenterology;  Laterality: N/A;   EXPLORATORY LAPAROTOMY  1973   for endometrosis   HAMMERTOE RECONSTRUCTION WITH WEIL OSTEOTOMY Right 01/09/2019   Procedure: RIGHT FOOT 2-4 HAMMERTOE CORRECTION WITH WEIL OSTEOTOMY 2ND METATARSAL, DEEP TENDON TRANSFER WITH AKIN OSTEOTOMY;  Surgeon: Terance Hart, MD;  Location: [facility] OR;  Service: Orthopedics;  Laterality: Right;   I & D EXTREMITY Right 06/10/2016   Procedure: IRRIGATION AND DEBRIDEMENT THUMB FLEXOR SHEATH;  Surgeon: Betha Loa, MD;  Location: [facility] OR;  Service: Orthopedics;  Laterality: Right;   I & D EXTREMITY Right 04/23/2022   Procedure: RIGHT LEG DEBRIDEMENT;  Surgeon: Nadara Mustard, MD;  Location: [facility] OR;  Service: Orthopedics;  Laterality: Right;   JOINT REPLACEMENT     Left Hip   MANDIBLE SURGERY  1978   rectovaginal fistula repair  1980   REMOVAL OF STONES  03/15/2021   Procedure: REMOVAL OF STONES;  Surgeon: Kerin Salen, MD;  Location: [facility];  Service: Gastroenterology;;   Dennison Mascot  03/15/2021   Procedure: Dennison Mascot;  Surgeon: Kerin Salen, MD;  Location: [facility];  Service: Gastroenterology;;   TOTAL HIP ARTHROPLASTY  05/08/2012   Procedure: TOTAL HIP  ARTHROPLASTY;  Surgeon: [name], MD;  Location: [facility] OR;  Service: Orthopedics;  Laterality: Left;   TOTAL HIP ARTHROPLASTY Right 09/18/2018   Procedure: TOTAL HIP ARTHROPLASTY ANTERIOR APPROACH;  Surgeon: Gean Birchwood, MD;  Location: [facility];  Service: Orthopedics;  Laterality: Right;   TOTAL KNEE ARTHROPLASTY Right 01/24/2013   Dr Turner Daniels   TOTAL KNEE ARTHROPLASTY Right 01/24/2013   Procedure: RIGHT TOTAL KNEE ARTHROPLASTY;  Surgeon: [name], MD;  Location: [facility] OR;  Service: Orthopedics;  Laterality: Right;    SOCIAL HISTORY: Social History   Socioeconomic History   Marital status: Married    Spouse name: Not on file   Number of children: 1   Years of education: Not on file   Highest education level: Not on file  Occupational History   Not on file  Tobacco Use   Smoking status: Former    Current packs/day: 0.00    Average packs/day: 1 pack/day for 10.0 years (10.0 ttl pk-yrs)    Types: Cigarettes    Start date: 11/09/1971    Quit date: 11/08/1981    Years since quitting: 42.2   Smokeless tobacco: Never  Vaping Use   Vaping status: Never Used  Substance and Sexual Activity   Alcohol use: No   Drug use: No   Sexual activity: Not on file  Other Topics Concern   Not on file  Social History Narrative   Not on file   Social Drivers of Health   Financial Resource Strain: Not on file  Food Insecurity: Not on file  Transportation Needs: Not on file  Physical Activity: Not on file  Stress: Not on file  Social Connections: Not on file  Intimate Partner Violence: Not on file    FAMILY HISTORY: Family History  Problem Relation Age of Onset   Dementia Mother    Cancer Father    Breast cancer Neg Hx     ALLERGIES:  is allergic to other, atorvastatin, metronidazole, oxybutynin, and septra [sulfamethoxazole-trimethoprim].  MEDICATIONS:  Current Outpatient Medications  Medication Sig Dispense Refill   acetaminophen (TYLENOL) 325 MG tablet Take 2 tablets (650 mg total)  by mouth every 6 (six) hours as needed.     bismuth subsalicylate (PEPTO BISMOL) 262 MG/15ML suspension Take 30 mLs by mouth every 6 (six) hours as needed.     Cholecalciferol (VITAMIN D) 50 MCG (2000 UT) tablet Take 2,000 Units by mouth daily.      cyanocobalamin 1000 MCG tablet Take 1,000 mcg by mouth daily.      escitalopram (LEXAPRO) 20 MG tablet Take 20 mg by mouth daily.     fluticasone (FLONASE) 50 MCG/ACT nasal spray Place 2 sprays into both nostrils daily as needed for allergies.     furosemide (LASIX) 20 MG tablet Take 20 mg by mouth daily.     gabapentin (NEURONTIN) 300 MG capsule Take 300 mg by mouth 3 (three) times daily.     ibuprofen (ADVIL) 200 MG tablet Take 400 mg by  mouth 2 (two) times daily.     metoprolol succinate (TOPROL XL) 25 MG 24 hr tablet Take 0.5 tablets (12.5 mg total) by mouth daily.     omeprazole (PRILOSEC) 20 MG capsule Take 20 mg by mouth 2 (two) times daily before a meal.     OZEMPIC, 1 MG/DOSE, 4 MG/3ML SOPN Inject 2 mg into the skin once a week.     potassium chloride (KLOR-CON) 10 MEQ tablet Take 10 mEq by mouth daily.     rosuvastatin (CRESTOR) 5 MG tablet Take 5 mg by mouth at bedtime.     No current facility-administered medications for this visit.    REVIEW OF SYSTEMS:   Constitutional: ( - ) fevers, ( - )  chills , ( - ) night sweats Eyes: ( - ) blurriness of vision, ( - ) double vision, ( - ) watery eyes Ears, nose, mouth, throat, and face: ( - ) mucositis, ( - ) sore throat Respiratory: ( - ) cough, ( - ) dyspnea, ( - ) wheezes Cardiovascular: ( - ) palpitation, ( - ) chest discomfort, ( - ) lower extremity swelling Gastrointestinal:  ( - ) nausea, ( - ) heartburn, ( - ) change in bowel habits Skin: ( - ) abnormal skin rashes Lymphatics: ( - ) new lymphadenopathy, ( - ) easy bruising Neurological: ( - ) numbness, ( - ) tingling, ( - ) new weaknesses Behavioral/Psych: ( - ) mood change, ( - ) new changes  All other systems were reviewed with  the patient and are negative.  PHYSICAL EXAMINATION:  There were no vitals filed for this visit.  There were no vitals filed for this visit.   GENERAL: well appearing elderly Caucasian female in NAD  SKIN: skin color, texture, turgor are normal, no rashes or significant lesions EYES: conjunctiva are pink and non-injected, sclera clear LUNGS: clear to auscultation and percussion with normal breathing effort HEART: regular rate & rhythm and no murmurs. Bilateral lower extremity edema.  Musculoskeletal: no cyanosis of digits and no clubbing  PSYCH: alert & oriented x 3, fluent speech NEURO: no focal motor/sensory deficits  LABORATORY DATA:  I have reviewed the data as listed    Latest Ref Rng & Units 01/19/2023    9:34 AM 07/13/2022    2:33 PM 07/01/2022    9:40 AM  CBC  WBC 4.0 - 10.5 K/uL 2.9  3.3  2.9   Hemoglobin 12.0 - 15.0 g/dL 78.2  95.6  21.3   Hematocrit 36.0 - 46.0 % 35.8  36.5  34.3   Platelets 150 - 400 K/uL 135  153  144        Latest Ref Rng & Units 01/19/2023    9:34 AM 07/13/2022    2:33 PM 06/04/2022    2:02 PM  CMP  Glucose 70 - 99 mg/dL 086  578  469   BUN 8 - 23 mg/dL 23  16  13    Creatinine 0.44 - 1.00 mg/dL 6.29  5.28  4.13   Sodium 135 - 145 mmol/L 140  139  139   Potassium 3.5 - 5.1 mmol/L 4.6  4.1  4.2   Chloride 98 - 111 mmol/L 105  105  105   CO2 22 - 32 mmol/L 28  27  27    Calcium 8.9 - 10.3 mg/dL 9.2  9.6  9.0   Total Protein 6.5 - 8.1 g/dL 7.0  7.2  7.0   Total Bilirubin 0.3 - 1.2 mg/dL 0.8  0.8  0.8   Alkaline Phos 38 - 126 U/L 65  73  71   AST 15 - 41 U/L 21  28  21    ALT 0 - 44 U/L 18  29  14       ASSESSMENT & PLAN Brunetta Genera 78 y.o. female with medical history significant for  newly diagnosed myelodysplastic syndrome.   #Myelodysplastic Syndrome-Low grade: # Thrombocytopenia # Leukopenia/Neutropenia --Bone marrow biopsy form 07/01/2022 showed hypercellular bone marrow (75%) with erythroid and megakaryocytic dysplasia consistent  with myelodysplastic syndrome with multilineage dysplasia  --Labs today show WBC 2.9, Hgb 12.2, MCV 96.5, Plt 135 --No indication to start therapy at this time --Recommend to monitor at this time. --Return in 12 months with labs  and follow up visit   No orders of the defined types were placed in this encounter.   All questions were answered. The patient knows to call the clinic with any problems, questions or concerns.  I have spent a total of 30 minutes minutes of face-to-face and non-face-to-face time, preparing to see the patient,performing a medically appropriate examination, counseling and educating the patient,documenting clinical information in the electronic health record,  and care coordination.   Ulysees Barns, MD Department of Hematology/Oncology [facility] Phone: [phone] Pager: [phone] Email: [email or web address]  01/19/2024 7:44 AM

## 2024-01-23 ENCOUNTER — Encounter: Payer: Self-pay | Admitting: Podiatry

## 2024-01-23 ENCOUNTER — Ambulatory Visit: Admitting: Podiatry

## 2024-01-23 VITALS — Ht 66.0 in | Wt 220.0 lb

## 2024-01-23 DIAGNOSIS — M7751 Other enthesopathy of right foot: Secondary | ICD-10-CM

## 2024-01-23 DIAGNOSIS — S90122A Contusion of left lesser toe(s) without damage to nail, initial encounter: Secondary | ICD-10-CM | POA: Diagnosis not present

## 2024-01-23 DIAGNOSIS — M2011 Hallux valgus (acquired), right foot: Secondary | ICD-10-CM

## 2024-01-24 DIAGNOSIS — Z79899 Other long term (current) drug therapy: Secondary | ICD-10-CM | POA: Diagnosis not present

## 2024-01-24 DIAGNOSIS — I700 Atherosclerosis of aorta: Secondary | ICD-10-CM | POA: Diagnosis not present

## 2024-01-24 DIAGNOSIS — G4733 Obstructive sleep apnea (adult) (pediatric): Secondary | ICD-10-CM | POA: Diagnosis not present

## 2024-01-24 DIAGNOSIS — E1122 Type 2 diabetes mellitus with diabetic chronic kidney disease: Secondary | ICD-10-CM | POA: Diagnosis not present

## 2024-01-24 DIAGNOSIS — Z0000 Encounter for general adult medical examination without abnormal findings: Secondary | ICD-10-CM | POA: Diagnosis not present

## 2024-01-24 DIAGNOSIS — E785 Hyperlipidemia, unspecified: Secondary | ICD-10-CM | POA: Diagnosis not present

## 2024-01-24 DIAGNOSIS — Z23 Encounter for immunization: Secondary | ICD-10-CM | POA: Diagnosis not present

## 2024-01-24 DIAGNOSIS — K7460 Unspecified cirrhosis of liver: Secondary | ICD-10-CM | POA: Diagnosis not present

## 2024-01-24 DIAGNOSIS — E559 Vitamin D deficiency, unspecified: Secondary | ICD-10-CM | POA: Diagnosis not present

## 2024-01-24 DIAGNOSIS — I77810 Thoracic aortic ectasia: Secondary | ICD-10-CM | POA: Diagnosis not present

## 2024-01-24 NOTE — Progress Notes (Signed)
 Subjective:   Patient ID: Kathleen Wiley, female   DOB: 78 y.o.   MRN: [id number]   HPI Patient presents stating that she has 2 separate problems with 1 being discoloration concerns second digit left with probable trauma and secondarily a lot of pain fourth digit right with fluid buildup and trouble with wearing shoe gear.  Patient states this has been occurring over the last several weeks   ROS      Objective:  Physical Exam  Neurovascular status intact with inflammation noted of the inner phalangeal joint digit for right lateral side painful when pressed and the second toe left is found to have a distal discoloration with the probability of trauma localized to the area     Assessment:  Contusion of the left second toe distal with mild pain and inflammatory capsulitis of the fourth toe right     Plan:  8 MP reviewed both conditions and for the left I recommended allowing this to fall off naturally I do not see infection and it should be uneventful and for the right I did sterile prep and I injected the inner phalangeal joint 2 mg Dexasone Kenalog 5 mg Xylocaine applied sterile dressing.  Reappoint as symptoms indicate

## 2024-02-28 DIAGNOSIS — K7460 Unspecified cirrhosis of liver: Secondary | ICD-10-CM | POA: Diagnosis not present

## 2024-03-05 ENCOUNTER — Encounter: Payer: Self-pay | Admitting: Family Medicine

## 2024-03-07 ENCOUNTER — Telehealth: Payer: Self-pay | Admitting: Hematology and Oncology

## 2024-03-08 ENCOUNTER — Telehealth: Payer: Self-pay | Admitting: Hematology and Oncology

## 2024-03-09 ENCOUNTER — Inpatient Hospital Stay

## 2024-03-09 ENCOUNTER — Inpatient Hospital Stay: Admitting: Hematology and Oncology

## 2024-03-15 DIAGNOSIS — K760 Fatty (change of) liver, not elsewhere classified: Secondary | ICD-10-CM | POA: Diagnosis not present

## 2024-03-15 DIAGNOSIS — D61818 Other pancytopenia: Secondary | ICD-10-CM | POA: Diagnosis not present

## 2024-03-15 DIAGNOSIS — D469 Myelodysplastic syndrome, unspecified: Secondary | ICD-10-CM | POA: Diagnosis not present

## 2024-03-15 DIAGNOSIS — I509 Heart failure, unspecified: Secondary | ICD-10-CM | POA: Diagnosis not present

## 2024-04-08 NOTE — Progress Notes (Unsigned)
 [facility] [phone]   [phone]  PROGRESS NOTE  Patient Care Team: Olin Bertin, MD as PCP - General (Family Medicine) Audery Blazing Deannie Fabian, MD as PCP - Cardiology (Cardiology)  CHIEF COMPLAINTS/PURPOSE OF CONSULTATION:  Myelodysplastic Syndrome with Multilineage Dysplasia.   HISTORY OF PRESENTING ILLNESS:  Kathleen Wiley 78 y.o. female returns for a follow up for newly diagnosed MDS. She is unaccompanied for this visit.   On exam today, Kathleen Wiley reports she has been well overall in the interim since her last visit except that she is "getting older".  She notes she has had no hospitalizations or ER visits since her last discussion.  She is started on no new medications.  She reports that she notes that her energy is so-so and "not great".  She is not having any lightheadedness, dizziness, shortness of breath.  She does feel sleepy quite often.  She reports that she is eating well and has a strong appetite.  She has had no recent infectious symptoms such as runny nose, sore throat, cough.  She denies any fevers, chills, sweats.  She is not having overt signs of bleeding such as nosebleeds, gum bleeding, or dark stools.  Overall she feels well and has no questions concerns or complaints today.  Full 10 point ROS is otherwise negative.  MEDICAL HISTORY:  Past Medical History:  Diagnosis Date   Anxiety    Arthritis    knee, hip   Cellulitis 08/2016   left leg   Complication of anesthesia    Diabetes mellitus without complication (HCC)    GERD (gastroesophageal reflux disease)    Heart murmur    "slight "  informed years ago.-    Hernia, umbilical    History of atrial fibrillation 2007   with flagyl   History of endometriosis    Hypercholesteremia    Hypertension    has not been to a cardiologist   PONV (postoperative nausea and vomiting)    Rectal vaginal fistula    Refusal of blood transfusions as patient is Jehovah's Witness    Sleep  apnea     SURGICAL HISTORY: Past Surgical History:  Procedure Laterality Date   APPENDECTOMY     BUNIONECTOMY Right 01/09/2019   Procedure: RIGHT FOOT BUNION CORRECTION;  Surgeon: Donnamarie Gables, MD;  Location: [facility] OR;  Service: Orthopedics;  Laterality: Right;   CHOLECYSTECTOMY N/A 03/16/2021   Procedure: LAPAROSCOPIC CHOLECYSTECTOMY;  Surgeon: Lockie Rima, MD;  Location: [facility];  Service: General;  Laterality: N/A;   ENDOSCOPIC RETROGRADE CHOLANGIOPANCREATOGRAPHY (ERCP) WITH PROPOFOL  N/A 03/15/2021   Procedure: ENDOSCOPIC RETROGRADE CHOLANGIOPANCREATOGRAPHY (ERCP) WITH PROPOFOL ;  Surgeon: Genell Ken, MD;  Location: [facility];  Service: Gastroenterology;  Laterality: N/A;   EXPLORATORY LAPAROTOMY  1973   for endometrosis   HAMMERTOE RECONSTRUCTION WITH WEIL OSTEOTOMY Right 01/09/2019   Procedure: RIGHT FOOT 2-4 HAMMERTOE CORRECTION WITH WEIL OSTEOTOMY 2ND METATARSAL, DEEP TENDON TRANSFER WITH AKIN OSTEOTOMY;  Surgeon: Donnamarie Gables, MD;  Location: [facility] OR;  Service: Orthopedics;  Laterality: Right;   I & D EXTREMITY Right 06/10/2016   Procedure: IRRIGATION AND DEBRIDEMENT THUMB FLEXOR SHEATH;  Surgeon: Brunilda Capra, MD;  Location: [facility] OR;  Service: Orthopedics;  Laterality: Right;   I & D EXTREMITY Right 04/23/2022   Procedure: RIGHT LEG DEBRIDEMENT;  Surgeon: Timothy Ford, MD;  Location: [facility] OR;  Service: Orthopedics;  Laterality: Right;   JOINT REPLACEMENT     Left Hip   MANDIBLE SURGERY  1978  rectovaginal fistula repair  1980   REMOVAL OF STONES  03/15/2021   Procedure: REMOVAL OF STONES;  Surgeon: Genell Ken, MD;  Location: [facility];  Service: Gastroenterology;;   [address]  03/15/2021   Procedure: [address];  Surgeon: Genell Ken, MD;  Location: [facility];  Service: Gastroenterology;;   TOTAL HIP ARTHROPLASTY  05/08/2012   Procedure: TOTAL HIP ARTHROPLASTY;  Surgeon: Ilean Mall, MD;  Location: [facility] OR;  Service: Orthopedics;  Laterality: Left;   TOTAL HIP  ARTHROPLASTY Right 09/18/2018   Procedure: TOTAL HIP ARTHROPLASTY ANTERIOR APPROACH;  Surgeon: Wendolyn Hamburger, MD;  Location: [facility];  Service: Orthopedics;  Laterality: Right;   TOTAL KNEE ARTHROPLASTY Right 01/24/2013   Dr Carry Clapper   TOTAL KNEE ARTHROPLASTY Right 01/24/2013   Procedure: RIGHT TOTAL KNEE ARTHROPLASTY;  Surgeon: Ilean Mall, MD;  Location: [facility] OR;  Service: Orthopedics;  Laterality: Right;    SOCIAL HISTORY: Social History   Socioeconomic History   Marital status: Married    Spouse name: Not on file   Number of children: 1   Years of education: Not on file   Highest education level: Not on file  Occupational History   Not on file  Tobacco Use   Smoking status: Former    Current packs/day: 0.00    Average packs/day: 1 pack/day for 10.0 years (10.0 ttl pk-yrs)    Types: Cigarettes    Start date: 11/09/1971    Quit date: 11/08/1981    Years since quitting: 42.4   Smokeless tobacco: Never  Vaping Use   Vaping status: Never Used  Substance and Sexual Activity   Alcohol use: No   Drug use: No   Sexual activity: Not on file  Other Topics Concern   Not on file  Social History Narrative   Not on file   Social Drivers of Health   Financial Resource Strain: Not on file  Food Insecurity: Not on file  Transportation Needs: Not on file  Physical Activity: Not on file  Stress: Not on file  Social Connections: Not on file  Intimate Partner Violence: Not on file    FAMILY HISTORY: Family History  Problem Relation Age of Onset   Dementia Mother    Cancer Father    Breast cancer Neg Hx     ALLERGIES:  is allergic to other, atorvastatin, metronidazole, oxybutynin , and septra [sulfamethoxazole-trimethoprim].  MEDICATIONS:  Current Outpatient Medications  Medication Sig Dispense Refill   Cholecalciferol  (VITAMIN D) 50 MCG (2000 UT) tablet Take 2,000 Units by mouth daily.      cyanocobalamin  1000 MCG tablet Take 1,000 mcg by mouth daily.      escitalopram  (LEXAPRO ) 20  MG tablet Take 20 mg by mouth daily.     fluticasone  (FLONASE ) 50 MCG/ACT nasal spray Place 2 sprays into both nostrils daily as needed for allergies.     furosemide  (LASIX ) 20 MG tablet Take 20 mg by mouth daily.     gabapentin  (NEURONTIN ) 300 MG capsule Take 300 mg by mouth 3 (three) times daily.     ibuprofen  (ADVIL ) 200 MG tablet Take 400 mg by mouth 3 (three) times daily.     metoprolol  succinate (TOPROL  XL) 25 MG 24 hr tablet Take 0.5 tablets (12.5 mg total) by mouth daily. (Patient taking differently: Take 25 mg by mouth daily.)     omeprazole (PRILOSEC) 20 MG capsule Take 20 mg by mouth 2 (two) times daily before a meal.     OZEMPIC , 1 MG/DOSE, 4 MG/3ML SOPN Inject  2 mg into the skin once a week.     potassium chloride  (KLOR-CON ) 10 MEQ tablet Take 10 mEq by mouth daily.     rosuvastatin  (CRESTOR ) 5 MG tablet Take 5 mg by mouth at bedtime.     No current facility-administered medications for this visit.    REVIEW OF SYSTEMS:   Constitutional: ( - ) fevers, ( - )  chills , ( - ) night sweats Eyes: ( - ) blurriness of vision, ( - ) double vision, ( - ) watery eyes Ears, nose, mouth, throat, and face: ( - ) mucositis, ( - ) sore throat Respiratory: ( - ) cough, ( - ) dyspnea, ( - ) wheezes Cardiovascular: ( - ) palpitation, ( - ) chest discomfort, ( - ) lower extremity swelling Gastrointestinal:  ( - ) nausea, ( - ) heartburn, ( - ) change in bowel habits Skin: ( - ) abnormal skin rashes Lymphatics: ( - ) new lymphadenopathy, ( - ) easy bruising Neurological: ( - ) numbness, ( - ) tingling, ( - ) new weaknesses Behavioral/Psych: ( - ) mood change, ( - ) new changes  All other systems were reviewed with the patient and are negative.  PHYSICAL EXAMINATION:  Vitals:   04/09/24 1206  BP: (!) 144/69  Pulse: 69  Resp: 13  Temp: 98 F (36.7 C)  SpO2: 97%    Filed Weights   04/09/24 1206  Weight: 225 lb 8 oz (102.3 kg)     GENERAL: well appearing elderly Caucasian female in  NAD  SKIN: skin color, texture, turgor are normal, no rashes or significant lesions EYES: conjunctiva are pink and non-injected, sclera clear LUNGS: clear to auscultation and percussion with normal breathing effort HEART: regular rate & rhythm and no murmurs. Bilateral lower extremity edema.  Musculoskeletal: no cyanosis of digits and no clubbing  PSYCH: alert & oriented x 3, fluent speech NEURO: no focal motor/sensory deficits  LABORATORY DATA:  I have reviewed the data as listed    Latest Ref Rng & Units 04/09/2024   11:33 AM 01/19/2023    9:34 AM 07/13/2022    2:33 PM  CBC  WBC 4.0 - 10.5 K/uL 2.4  2.9  3.3   Hemoglobin 12.0 - 15.0 g/dL 40.9  81.1  91.4   Hematocrit 36.0 - 46.0 % 35.0  35.8  36.5   Platelets 150 - 400 K/uL 118  135  153        Latest Ref Rng & Units 04/09/2024   11:50 AM 01/19/2023    9:34 AM 07/13/2022    2:33 PM  CMP  Glucose 70 - 99 mg/dL 782  956  213   BUN 8 - 23 mg/dL 16  23  16    Creatinine 0.44 - 1.00 mg/dL 0.86  5.78  4.69   Sodium 135 - 145 mmol/L 139  140  139   Potassium 3.5 - 5.1 mmol/L 4.2  4.6  4.1   Chloride 98 - 111 mmol/L 105  105  105   CO2 22 - 32 mmol/L 28  28  27    Calcium  8.9 - 10.3 mg/dL 8.7  9.2  9.6   Total Protein 6.5 - 8.1 g/dL 6.7  7.0  7.2   Total Bilirubin 0.0 - 1.2 mg/dL 0.9  0.8  0.8   Alkaline Phos 38 - 126 U/L 72  65  73   AST 15 - 41 U/L 21  21  28    ALT 0 - 44  U/L 19  18  29       ASSESSMENT & PLAN Kathleen Wiley 78 y.o. female with medical history significant for  newly diagnosed myelodysplastic syndrome.   #Myelodysplastic Syndrome-Low grade: # Thrombocytopenia # Leukopenia/Neutropenia --Bone marrow biopsy form 07/01/2022 showed hypercellular bone marrow (75%) with erythroid and megakaryocytic dysplasia consistent with myelodysplastic syndrome with multilineage dysplasia  --Labs today show WBC 2.4, hemoglobin 11.8, MCV 95.1, platelets 118 --No indication to start therapy at this time --Recommend to monitor at this  time. --Return in 6 months with labs  and follow up visit.  Seeing her more frequently because her levels are steadily dropping.  No orders of the defined types were placed in this encounter.   All questions were answered. The patient knows to call the clinic with any problems, questions or concerns.  I have spent a total of 30 minutes minutes of face-to-face and non-face-to-face time, preparing to see the patient,performing a medically appropriate examination, counseling and educating the patient,documenting clinical information in the electronic health record,  and care coordination.   Rogerio Clay, MD Department of Hematology/Oncology [facility] Phone: [phone] Pager: [phone] Email: [email or web address]  04/09/2024 4:18 PM

## 2024-04-09 ENCOUNTER — Inpatient Hospital Stay: Attending: Internal Medicine

## 2024-04-09 ENCOUNTER — Inpatient Hospital Stay: Admitting: Hematology and Oncology

## 2024-04-09 VITALS — BP 144/69 | HR 69 | Temp 98.0°F | Resp 13 | Wt 225.5 lb

## 2024-04-09 DIAGNOSIS — Z87891 Personal history of nicotine dependence: Secondary | ICD-10-CM | POA: Diagnosis not present

## 2024-04-09 DIAGNOSIS — D46Z Other myelodysplastic syndromes: Secondary | ICD-10-CM

## 2024-04-09 DIAGNOSIS — D696 Thrombocytopenia, unspecified: Secondary | ICD-10-CM | POA: Diagnosis not present

## 2024-04-09 DIAGNOSIS — Z79899 Other long term (current) drug therapy: Secondary | ICD-10-CM | POA: Insufficient documentation

## 2024-04-09 DIAGNOSIS — D709 Neutropenia, unspecified: Secondary | ICD-10-CM | POA: Diagnosis not present

## 2024-04-09 DIAGNOSIS — D708 Other neutropenia: Secondary | ICD-10-CM

## 2024-04-09 DIAGNOSIS — D539 Nutritional anemia, unspecified: Secondary | ICD-10-CM

## 2024-04-09 DIAGNOSIS — D46A Refractory cytopenia with multilineage dysplasia: Secondary | ICD-10-CM | POA: Diagnosis not present

## 2024-04-09 LAB — CBC WITH DIFFERENTIAL (CANCER CENTER ONLY)
Abs Immature Granulocytes: 0.02 10*3/uL (ref 0.00–0.07)
Basophils Absolute: 0 10*3/uL (ref 0.0–0.1)
Basophils Relative: 1 %
Eosinophils Absolute: 0.1 10*3/uL (ref 0.0–0.5)
Eosinophils Relative: 3 %
HCT: 35 % — ABNORMAL LOW (ref 36.0–46.0)
Hemoglobin: 11.8 g/dL — ABNORMAL LOW (ref 12.0–15.0)
Immature Granulocytes: 1 %
Lymphocytes Relative: 43 %
Lymphs Abs: 1 10*3/uL (ref 0.7–4.0)
MCH: 32.1 pg (ref 26.0–34.0)
MCHC: 33.7 g/dL (ref 30.0–36.0)
MCV: 95.1 fL (ref 80.0–100.0)
Monocytes Absolute: 0.2 10*3/uL (ref 0.1–1.0)
Monocytes Relative: 7 %
Neutro Abs: 1.1 10*3/uL — ABNORMAL LOW (ref 1.7–7.7)
Neutrophils Relative %: 45 %
Platelet Count: 118 10*3/uL — ABNORMAL LOW (ref 150–400)
RBC: 3.68 MIL/uL — ABNORMAL LOW (ref 3.87–5.11)
RDW: 14 % (ref 11.5–15.5)
WBC Count: 2.4 10*3/uL — ABNORMAL LOW (ref 4.0–10.5)
nRBC: 0 % (ref 0.0–0.2)

## 2024-04-09 LAB — CMP (CANCER CENTER ONLY)
ALT: 19 U/L (ref 0–44)
AST: 21 U/L (ref 15–41)
Albumin: 4.1 g/dL (ref 3.5–5.0)
Alkaline Phosphatase: 72 U/L (ref 38–126)
Anion gap: 6 (ref 5–15)
BUN: 16 mg/dL (ref 8–23)
CO2: 28 mmol/L (ref 22–32)
Calcium: 8.7 mg/dL — ABNORMAL LOW (ref 8.9–10.3)
Chloride: 105 mmol/L (ref 98–111)
Creatinine: 0.73 mg/dL (ref 0.44–1.00)
GFR, Estimated: >60 mL/min (ref >60)
Glucose, Bld: 120 mg/dL — ABNORMAL HIGH (ref 70–99)
Potassium: 4.2 mmol/L (ref 3.5–5.1)
Sodium: 139 mmol/L (ref 135–145)
Total Bilirubin: 0.9 mg/dL (ref 0.0–1.2)
Total Protein: 6.7 g/dL (ref 6.5–8.1)

## 2024-04-09 LAB — LACTATE DEHYDROGENASE: LDH: 190 U/L (ref 98–192)

## 2024-05-07 DIAGNOSIS — I5031 Acute diastolic (congestive) heart failure: Secondary | ICD-10-CM | POA: Diagnosis not present

## 2024-05-07 DIAGNOSIS — E1140 Type 2 diabetes mellitus with diabetic neuropathy, unspecified: Secondary | ICD-10-CM | POA: Diagnosis not present

## 2024-06-01 ENCOUNTER — Ambulatory Visit: Admitting: Podiatry

## 2024-06-01 ENCOUNTER — Encounter: Payer: Self-pay | Admitting: Podiatry

## 2024-06-01 VITALS — Ht 66.0 in | Wt 225.5 lb

## 2024-06-01 DIAGNOSIS — D367 Benign neoplasm of other specified sites: Secondary | ICD-10-CM | POA: Diagnosis not present

## 2024-06-01 DIAGNOSIS — M79675 Pain in left toe(s): Secondary | ICD-10-CM

## 2024-06-01 DIAGNOSIS — M79674 Pain in right toe(s): Secondary | ICD-10-CM

## 2024-06-01 DIAGNOSIS — B351 Tinea unguium: Secondary | ICD-10-CM | POA: Diagnosis not present

## 2024-06-04 ENCOUNTER — Ambulatory Visit: Admitting: Podiatry

## 2024-06-05 NOTE — Progress Notes (Signed)
 Chief Complaint  Patient presents with   Callouses    Pt is here due to corn on the right 4th and 5th toe that is causing some pain and would like to have it shaved, also wants her toenail trimmed.    SUBJECTIVE Patient with a history of diabetes mellitus presents to office today complaining of elongated, thickened nails that cause pain while ambulating in shoes.  Patient is unable to trim their own nails.   Patient also has developed a symptomatic callus between the 4th and 5th digit of the right foot and would like to have it evaluated as well today.  Patient is here for further evaluation and treatment.  Past Medical History:  Diagnosis Date   Anxiety    Arthritis    knee, hip   Cellulitis 08/2016   left leg   Complication of anesthesia    Diabetes mellitus without complication (HCC)    GERD (gastroesophageal reflux disease)    Heart murmur    slight   informed years ago.-    Hernia, umbilical    History of atrial fibrillation 2007   with flagyl   History of endometriosis    Hypercholesteremia    Hypertension    has not been to a cardiologist   PONV (postoperative nausea and vomiting)    Rectal vaginal fistula    Refusal of blood transfusions as patient is Jehovah's Witness    Sleep apnea     Allergies  Allergen Reactions   Other     Refuses whole blood but does accept albumin    Atorvastatin Other (See Comments)   Metronidazole Other (See Comments)    Chest pain, A-fib    Oxybutynin  Other (See Comments)   Septra [Sulfamethoxazole-Trimethoprim] Other (See Comments)    Blisters      OBJECTIVE General Patient is awake, alert, and oriented x 3 and in no acute distress. Derm Skin is dry and supple bilateral. Negative open lesions or macerations. Remaining integument unremarkable. Nails are tender, long, thickened and dystrophic with subungual debris, consistent with onychomycosis, 1-5 bilateral. No signs of infection noted. Hyperkeratotic soft callus noted  between the 4th and 5th digit of the right foot with associated tenderness.  No open wound Vasc  DP and PT pedal pulses palpable bilaterally. Temperature gradient within normal limits.  Neuro Epicritic and protective threshold sensation diminished bilaterally.  Musculoskeletal Exam No symptomatic pedal deformities noted bilateral. Muscular strength within normal limits.  ASSESSMENT 1. Diabetes Mellitus w/ peripheral neuropathy 2.  Pain due to onychomycosis of toenails bilateral 3. 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 -Instructed to maintain good pedal hygiene and foot care. Stressed importance of controlling blood sugar.  -Mechanical debridement of nails 1-5 bilaterally performed using a nail nipper. Filed with dremel without incident.  -Due to the sensitivity around the symptomatic callus injection of 1 mL of 2% lidocaine  plain infiltrated just proximal to the soft callus to enable debridement.  The callus was debrided today and the patient tolerated this well  -Recommend wide fitting shoes that do not irritate or constrict the toebox area -Return to clinic PRN    Thresa EMERSON Sar, DPM [facility]  Dr. Thresa EMERSON Sar, DPM    [address]  [place], [place] [address]                Office [phone]  Fax [phone]

## 2024-06-07 DIAGNOSIS — I5031 Acute diastolic (congestive) heart failure: Secondary | ICD-10-CM | POA: Diagnosis not present

## 2024-06-07 DIAGNOSIS — E1140 Type 2 diabetes mellitus with diabetic neuropathy, unspecified: Secondary | ICD-10-CM | POA: Diagnosis not present

## 2024-06-10 ENCOUNTER — Other Ambulatory Visit: Payer: Self-pay

## 2024-06-10 ENCOUNTER — Emergency Department (HOSPITAL_COMMUNITY)

## 2024-06-10 ENCOUNTER — Inpatient Hospital Stay (HOSPITAL_COMMUNITY)
Admission: EM | Admit: 2024-06-10 | Discharge: 2024-06-14 | DRG: 872 | Disposition: A | Discharge status: Home / Self Care | Attending: Internal Medicine | Admitting: Internal Medicine

## 2024-06-10 ENCOUNTER — Encounter (HOSPITAL_COMMUNITY): Payer: Self-pay

## 2024-06-10 DIAGNOSIS — Z820 Family history of epilepsy and other diseases of the nervous system: Secondary | ICD-10-CM

## 2024-06-10 DIAGNOSIS — E785 Hyperlipidemia, unspecified: Secondary | ICD-10-CM | POA: Diagnosis present

## 2024-06-10 DIAGNOSIS — D469 Myelodysplastic syndrome, unspecified: Secondary | ICD-10-CM | POA: Diagnosis present

## 2024-06-10 DIAGNOSIS — R531 Weakness: Principal | ICD-10-CM

## 2024-06-10 DIAGNOSIS — M7989 Other specified soft tissue disorders: Secondary | ICD-10-CM | POA: Diagnosis not present

## 2024-06-10 DIAGNOSIS — Z7985 Long-term (current) use of injectable non-insulin antidiabetic drugs: Secondary | ICD-10-CM

## 2024-06-10 DIAGNOSIS — T368X5A Adverse effect of other systemic antibiotics, initial encounter: Secondary | ICD-10-CM | POA: Diagnosis not present

## 2024-06-10 DIAGNOSIS — Z6835 Body mass index (BMI) 35.0-35.9, adult: Secondary | ICD-10-CM

## 2024-06-10 DIAGNOSIS — D696 Thrombocytopenia, unspecified: Secondary | ICD-10-CM

## 2024-06-10 DIAGNOSIS — Z1152 Encounter for screening for COVID-19: Secondary | ICD-10-CM

## 2024-06-10 DIAGNOSIS — D61818 Other pancytopenia: Secondary | ICD-10-CM | POA: Diagnosis present

## 2024-06-10 DIAGNOSIS — L03116 Cellulitis of left lower limb: Secondary | ICD-10-CM | POA: Diagnosis present

## 2024-06-10 DIAGNOSIS — Z87891 Personal history of nicotine dependence: Secondary | ICD-10-CM | POA: Diagnosis not present

## 2024-06-10 DIAGNOSIS — E669 Obesity, unspecified: Secondary | ICD-10-CM | POA: Diagnosis present

## 2024-06-10 DIAGNOSIS — N179 Acute kidney failure, unspecified: Secondary | ICD-10-CM | POA: Diagnosis present

## 2024-06-10 DIAGNOSIS — E1140 Type 2 diabetes mellitus with diabetic neuropathy, unspecified: Secondary | ICD-10-CM | POA: Diagnosis present

## 2024-06-10 DIAGNOSIS — A419 Sepsis, unspecified organism: Principal | ICD-10-CM | POA: Diagnosis present

## 2024-06-10 DIAGNOSIS — I10 Essential (primary) hypertension: Secondary | ICD-10-CM | POA: Diagnosis not present

## 2024-06-10 DIAGNOSIS — I480 Paroxysmal atrial fibrillation: Secondary | ICD-10-CM | POA: Diagnosis not present

## 2024-06-10 DIAGNOSIS — K219 Gastro-esophageal reflux disease without esophagitis: Secondary | ICD-10-CM | POA: Diagnosis not present

## 2024-06-10 DIAGNOSIS — R112 Nausea with vomiting, unspecified: Secondary | ICD-10-CM | POA: Diagnosis present

## 2024-06-10 DIAGNOSIS — R079 Chest pain, unspecified: Secondary | ICD-10-CM | POA: Diagnosis not present

## 2024-06-10 DIAGNOSIS — L03115 Cellulitis of right lower limb: Secondary | ICD-10-CM | POA: Diagnosis present

## 2024-06-10 DIAGNOSIS — Z882 Allergy status to sulfonamides status: Secondary | ICD-10-CM | POA: Diagnosis not present

## 2024-06-10 DIAGNOSIS — Z881 Allergy status to other antibiotic agents status: Secondary | ICD-10-CM

## 2024-06-10 DIAGNOSIS — M79661 Pain in right lower leg: Secondary | ICD-10-CM

## 2024-06-10 DIAGNOSIS — Z96651 Presence of right artificial knee joint: Secondary | ICD-10-CM | POA: Diagnosis not present

## 2024-06-10 DIAGNOSIS — G039 Meningitis, unspecified: Secondary | ICD-10-CM | POA: Diagnosis not present

## 2024-06-10 DIAGNOSIS — R21 Rash and other nonspecific skin eruption: Secondary | ICD-10-CM | POA: Diagnosis not present

## 2024-06-10 DIAGNOSIS — E7800 Pure hypercholesterolemia, unspecified: Secondary | ICD-10-CM | POA: Diagnosis not present

## 2024-06-10 DIAGNOSIS — S91301A Unspecified open wound, right foot, initial encounter: Secondary | ICD-10-CM

## 2024-06-10 DIAGNOSIS — L0390 Cellulitis, unspecified: Secondary | ICD-10-CM

## 2024-06-10 DIAGNOSIS — Z96643 Presence of artificial hip joint, bilateral: Secondary | ICD-10-CM | POA: Diagnosis present

## 2024-06-10 DIAGNOSIS — R601 Generalized edema: Secondary | ICD-10-CM | POA: Diagnosis not present

## 2024-06-10 DIAGNOSIS — Z888 Allergy status to other drugs, medicaments and biological substances status: Secondary | ICD-10-CM

## 2024-06-10 DIAGNOSIS — Z809 Family history of malignant neoplasm, unspecified: Secondary | ICD-10-CM

## 2024-06-10 DIAGNOSIS — R600 Localized edema: Secondary | ICD-10-CM | POA: Diagnosis not present

## 2024-06-10 DIAGNOSIS — E66811 Obesity, class 1: Secondary | ICD-10-CM | POA: Diagnosis present

## 2024-06-10 DIAGNOSIS — I517 Cardiomegaly: Secondary | ICD-10-CM | POA: Diagnosis not present

## 2024-06-10 DIAGNOSIS — R2241 Localized swelling, mass and lump, right lower limb: Secondary | ICD-10-CM

## 2024-06-10 DIAGNOSIS — R6520 Severe sepsis without septic shock: Secondary | ICD-10-CM | POA: Diagnosis present

## 2024-06-10 DIAGNOSIS — Z79899 Other long term (current) drug therapy: Secondary | ICD-10-CM

## 2024-06-10 DIAGNOSIS — L03119 Cellulitis of unspecified part of limb: Secondary | ICD-10-CM | POA: Diagnosis present

## 2024-06-10 DIAGNOSIS — M19071 Primary osteoarthritis, right ankle and foot: Secondary | ICD-10-CM | POA: Diagnosis not present

## 2024-06-10 LAB — URINALYSIS, ROUTINE W REFLEX MICROSCOPIC
Bacteria, UA: NONE SEEN
Bilirubin Urine: NEGATIVE
Glucose, UA: NEGATIVE mg/dL
Ketones, ur: NEGATIVE mg/dL
Leukocytes,Ua: NEGATIVE
Nitrite: NEGATIVE
Protein, ur: 30 mg/dL — AB
Specific Gravity, Urine: 1.019 (ref 1.005–1.030)
pH: 5 (ref 5.0–8.0)

## 2024-06-10 LAB — CBG MONITORING, ED: Glucose-Capillary: 173 mg/dL — ABNORMAL HIGH (ref 70–99)

## 2024-06-10 LAB — CBC
HCT: 36.9 % (ref 36.0–46.0)
HCT: 31.2 % — ABNORMAL LOW (ref 36.0–46.0)
Hemoglobin: 12 g/dL (ref 12.0–15.0)
Hemoglobin: 10.6 g/dL — ABNORMAL LOW (ref 12.0–15.0)
MCH: 31.7 pg (ref 26.0–34.0)
MCH: 33 pg (ref 26.0–34.0)
MCHC: 32.5 g/dL (ref 30.0–36.0)
MCHC: 34 g/dL (ref 30.0–36.0)
MCV: 97.6 fL (ref 80.0–100.0)
MCV: 97.2 fL (ref 80.0–100.0)
Platelets: 100 K/uL — ABNORMAL LOW (ref 150–400)
Platelets: 66 K/uL — ABNORMAL LOW (ref 150–400)
RBC: 3.78 MIL/uL — ABNORMAL LOW (ref 3.87–5.11)
RBC: 3.21 MIL/uL — ABNORMAL LOW (ref 3.87–5.11)
RDW: 14.4 % (ref 11.5–15.5)
RDW: 14.3 % (ref 11.5–15.5)
WBC: 4.8 K/uL (ref 4.0–10.5)
WBC: 2.2 K/uL — ABNORMAL LOW (ref 4.0–10.5)
nRBC: 0 % (ref 0.0–0.2)
nRBC: 0 % (ref 0.0–0.2)

## 2024-06-10 LAB — DIFFERENTIAL
Abs Immature Granulocytes: 0.08 K/uL — ABNORMAL HIGH (ref 0.00–0.07)
Basophils Absolute: 0 K/uL (ref 0.0–0.1)
Basophils Relative: 1 %
Eosinophils Absolute: 0 K/uL (ref 0.0–0.5)
Eosinophils Relative: 0 %
Immature Granulocytes: 4 %
Lymphocytes Relative: 23 %
Lymphs Abs: 0.5 K/uL — ABNORMAL LOW (ref 0.7–4.0)
Monocytes Absolute: 0.1 K/uL (ref 0.1–1.0)
Monocytes Relative: 4 %
Neutro Abs: 1.5 K/uL — ABNORMAL LOW (ref 1.7–7.7)
Neutrophils Relative %: 68 %

## 2024-06-10 LAB — I-STAT CG4 LACTIC ACID, ED: Lactic Acid, Venous: 1.3 mmol/L (ref 0.5–1.9)

## 2024-06-10 LAB — COMPREHENSIVE METABOLIC PANEL WITH GFR
ALT: 23 U/L (ref 0–44)
AST: 34 U/L (ref 15–41)
Albumin: 3.1 g/dL — ABNORMAL LOW (ref 3.5–5.0)
Alkaline Phosphatase: 68 U/L (ref 38–126)
Anion gap: 14 (ref 5–15)
BUN: 23 mg/dL (ref 8–23)
CO2: 20 mmol/L — ABNORMAL LOW (ref 22–32)
Calcium: 8.2 mg/dL — ABNORMAL LOW (ref 8.9–10.3)
Chloride: 102 mmol/L (ref 98–111)
Creatinine, Ser: 1.11 mg/dL — ABNORMAL HIGH (ref 0.44–1.00)
GFR, Estimated: 51 mL/min — ABNORMAL LOW (ref >60)
Glucose, Bld: 160 mg/dL — ABNORMAL HIGH (ref 70–99)
Potassium: 3.7 mmol/L (ref 3.5–5.1)
Sodium: 136 mmol/L (ref 135–145)
Total Bilirubin: 2.2 mg/dL — ABNORMAL HIGH (ref 0.0–1.2)
Total Protein: 6.2 g/dL — ABNORMAL LOW (ref 6.5–8.1)

## 2024-06-10 LAB — RESP PANEL BY RT-PCR (RSV, FLU A&B, COVID)  RVPGX2
Influenza A by PCR: NEGATIVE
Influenza B by PCR: NEGATIVE
Resp Syncytial Virus by PCR: NEGATIVE
SARS Coronavirus 2 by RT PCR: NEGATIVE

## 2024-06-10 LAB — SEDIMENTATION RATE: Sed Rate: 35 mm/h — ABNORMAL HIGH (ref 0–22)

## 2024-06-10 LAB — LIPASE, BLOOD: Lipase: 25 U/L (ref 11–51)

## 2024-06-10 LAB — C-REACTIVE PROTEIN: CRP: 12.7 mg/dL — ABNORMAL HIGH (ref <1.0)

## 2024-06-10 MED ORDER — ACETAMINOPHEN 650 MG RE SUPP: 650.0000 mg | Freq: Four times a day (QID) | RECTAL | Status: DC | PRN

## 2024-06-10 MED ORDER — SODIUM CHLORIDE 0.9 % IV SOLN
2.0000 g | INTRAVENOUS | Status: DC
  Administered 2024-06-11 – 2024-06-12 (×2): 2 g via INTRAVENOUS
  Filled 2024-06-10 (×2): qty 20

## 2024-06-10 MED ORDER — PANTOPRAZOLE SODIUM 40 MG PO TBEC
40.0000 mg | DELAYED_RELEASE_TABLET | Freq: Every day | ORAL | Status: DC
  Administered 2024-06-10 – 2024-06-14 (×5): 40 mg via ORAL
  Filled 2024-06-10 (×5): qty 1

## 2024-06-10 MED ORDER — ACETAMINOPHEN 325 MG PO TABS
650.0000 mg | ORAL_TABLET | Freq: Four times a day (QID) | ORAL | Status: DC | PRN
  Administered 2024-06-11: 650 mg via ORAL
  Filled 2024-06-10: qty 2

## 2024-06-10 MED ORDER — VANCOMYCIN HCL IN DEXTROSE 1-5 GM/200ML-% IV SOLN: 1000.0000 mg | INTRAVENOUS | Status: DC

## 2024-06-10 MED ORDER — ACETAMINOPHEN 325 MG PO TABS
650.0000 mg | ORAL_TABLET | Freq: Once | ORAL | Status: AC
  Administered 2024-06-10: 650 mg via ORAL
  Filled 2024-06-10: qty 2

## 2024-06-10 MED ORDER — SODIUM CHLORIDE 0.9% FLUSH
3.0000 mL | Freq: Two times a day (BID) | INTRAVENOUS | Status: DC
  Administered 2024-06-10 – 2024-06-14 (×6): 3 mL via INTRAVENOUS

## 2024-06-10 MED ORDER — CEPHALEXIN 500 MG PO CAPS: 500.0000 mg | ORAL_CAPSULE | Freq: Three times a day (TID) | ORAL | 0 refills | Status: DC

## 2024-06-10 MED ORDER — SODIUM CHLORIDE 0.9 % IV SOLN
1.0000 g | Freq: Once | INTRAVENOUS | Status: AC
  Administered 2024-06-10: 1 g via INTRAVENOUS
  Filled 2024-06-10: qty 10

## 2024-06-10 MED ORDER — ALBUTEROL SULFATE (2.5 MG/3ML) 0.083% IN NEBU: 2.5000 mg | INHALATION_SOLUTION | Freq: Four times a day (QID) | RESPIRATORY_TRACT | Status: DC | PRN

## 2024-06-10 MED ORDER — VANCOMYCIN HCL IN DEXTROSE 1-5 GM/200ML-% IV SOLN: 1000.0000 mg | Freq: Once | INTRAVENOUS | Status: DC

## 2024-06-10 MED ORDER — ENOXAPARIN SODIUM 40 MG/0.4ML IJ SOSY: 40.0000 mg | PREFILLED_SYRINGE | INTRAMUSCULAR | Status: DC

## 2024-06-10 MED ORDER — DIPHENHYDRAMINE HCL 50 MG/ML IJ SOLN
12.5000 mg | Freq: Four times a day (QID) | INTRAMUSCULAR | Status: DC | PRN
  Administered 2024-06-10 – 2024-06-12 (×5): 12.5 mg via INTRAVENOUS
  Filled 2024-06-10 (×6): qty 1

## 2024-06-10 MED ORDER — LACTATED RINGERS IV BOLUS
1000.0000 mL | Freq: Once | INTRAVENOUS | Status: AC
  Administered 2024-06-10: 1000 mL via INTRAVENOUS

## 2024-06-10 MED ORDER — GABAPENTIN 300 MG PO CAPS
300.0000 mg | ORAL_CAPSULE | Freq: Two times a day (BID) | ORAL | Status: DC
  Administered 2024-06-10 – 2024-06-14 (×9): 300 mg via ORAL
  Filled 2024-06-10 (×9): qty 1

## 2024-06-10 MED ORDER — VANCOMYCIN HCL 2000 MG/400ML IV SOLN
2000.0000 mg | Freq: Once | INTRAVENOUS | Status: AC
  Administered 2024-06-10: 2000 mg via INTRAVENOUS
  Filled 2024-06-10: qty 400

## 2024-06-10 MED ORDER — METOPROLOL SUCCINATE ER 25 MG PO TB24
25.0000 mg | ORAL_TABLET | Freq: Every day | ORAL | Status: DC
  Administered 2024-06-10 – 2024-06-13 (×4): 25 mg via ORAL
  Filled 2024-06-10 (×4): qty 1

## 2024-06-10 MED ORDER — SODIUM CHLORIDE 0.9 % IV SOLN: INTRAVENOUS | Status: AC

## 2024-06-10 MED ORDER — ROSUVASTATIN CALCIUM 5 MG PO TABS
5.0000 mg | ORAL_TABLET | Freq: Every day | ORAL | Status: DC
  Administered 2024-06-10 – 2024-06-13 (×4): 5 mg via ORAL
  Filled 2024-06-10 (×4): qty 1

## 2024-06-10 MED ORDER — ACETAMINOPHEN 10 MG/ML IV SOLN
1000.0000 mg | Freq: Once | INTRAVENOUS | Status: AC
  Administered 2024-06-10: 1000 mg via INTRAVENOUS
  Filled 2024-06-10: qty 100

## 2024-06-10 MED ORDER — ESCITALOPRAM OXALATE 20 MG PO TABS
20.0000 mg | ORAL_TABLET | Freq: Every day | ORAL | Status: DC
  Administered 2024-06-10 – 2024-06-13 (×4): 20 mg via ORAL
  Filled 2024-06-10 (×4): qty 1

## 2024-06-10 NOTE — ED Triage Notes (Addendum)
 Patient arrives POV with husband c/o feeling really tired and weak since yesterday. Patient reports a decreased appetite and states that she did not eat yesterday or today. States that she is staying hydrated by drinking water. Patient reports n/v; denies fever and diarrhea. Patient states she was not able to do her normal activities around the house. Patient states husband had meningitis in the middle of June and was hospitalized. Patient a&o x4.

## 2024-06-10 NOTE — Consult Note (Addendum)
 WOC Nurse Consult Note: patient admitted for RLE cellulitis ,no open wounds noted or reported to lower leg; concerns for diabetic ulcer R 2nd digit  Reason for Consult:R foot 2nd digit wound  Wound type: full thickness ? Diabetic ulcer Patient has notes from recent podiatry visit (06/01/2024) where she had soft corns but no open wounds at that time per his note  Pressure Injury POA: NA  Measurement: see nursing flowsheet  Wound bed: dark red covered in hyperkeratotic skin and callusing  Drainage (amount, consistency, odor) per nursing flowsheet  Periwound: callusing  Dressing procedure/placement/frequency: Cleanse R foot 2nd toe wounds with soap and water, dry and apply Xeroform gauze ([place] [phone]) to wound beds daily, cover with silicone foam or gauze and tape whichever patient prefers/works best.   Patient should continue to follow with podiatry as outpatient for ongoing care of her feet and management of this wound.   POC discussed with bedside nurse. WOC team will not follow. Re-consult if further needs arise.   Thank you,    Powell Bar MSN, RN-BC, [employer]

## 2024-06-10 NOTE — ED Provider Notes (Signed)
 7:17 AM Care assumed from Dr. Jerral.  At time of transfer of care, patient being worked up for likely cellulitis in the leg leaving her to have difficulty with ambulation and generalized fatigue and weakness however she is waiting on ultrasound to rule out DVT as well.  Previous team reported that patient was motivated to try to go home if she was feeling better and the ultrasound is negative.  She has received Rocephin  and was going to get Keflex  sent to her pharmacy for potentially outpatient treatment of cellulitis.  On my first reassessment patient reports she was feeling terrible and feels very warm.  Oral temperature found to be 102.6 which is the first time she has had a fever here.  Will add on a lactic acid and give her some Tylenol .  Patient still waiting on the ultrasound.  Anticipate reassessment but given the patient's age, impressively spreading cellulitis, fever, and now worsening malaise, anticipate she may end up requiring IV antibiotic continuation and admission versus discharge home.   10:35 AM Patient tells me she is still feeling worse.  Her fever has started to improve after the Tylenol  but she still has no appetite and is feeling very fatigued and ill.  Her DVT study just returned negative so there is no clot but I am concerned about the cellulitis and the patient's overall appearance with waxing waning fever and tachypnea.  Given her age and the cellulitis, I agree with admission for IV antibiotic management.  Will call for medical admission for observation for further management of the cellulitis.   Lorra Freeman, Lonni PARAS, MD 06/10/24 [phone]

## 2024-06-10 NOTE — Progress Notes (Signed)
   06/10/24 1820  Vitals  Temp 100.3 F (37.9 C)  Temp Source Oral  BP (!) 105/53  MAP (mmHg) 68  BP Method Automatic  Pulse Rate 83  Pulse Rate Source Monitor  MEWS COLOR  MEWS Score Color Green  Oxygen Therapy  SpO2 94 %  MEWS Score  MEWS Temp 0  MEWS Systolic 0  MEWS Pulse 0  MEWS RR 1  MEWS LOC 0  MEWS Score 1

## 2024-06-10 NOTE — H&P (Addendum)
 History and Physical    Patient: Kathleen Wiley FMW:[id number] DOB: 1946-10-06 DOA: 06/10/2024 DOS: the patient was seen and examined on 06/10/2024 PCP: Verena Mems, MD  Patient coming from: Home  Chief Complaint:  Chief Complaint  Patient presents with   Weakness   HPI: Kathleen Wiley is a 78 y.o. female with medical history significant of hypertension, hyperlipidemia, paroxysmal atrial fibrillation, diabetes mellitus type 2, myelodysplastic syndrome, prior h/o cellulitis presents with chills and vomiting.  She woke up on Saturday morning experiencing chills, shaking all over, and teeth chattering, unable to get warm. She felt unwell throughout the day and was brought to the hospital last night. She experienced vomiting right before leaving for the hospital. No abdominal pain, urinary discomfort, or frequency.  She has a history of cellulitis in her right leg, which she noticed a few days ago with symptoms of redness, swelling, and 'a bunch of little red dots.' She has been wearing compression stockings to manage the swelling. Her last episode of cellulitis occurred a couple of years ago. She has previously undergone surgery on her leg, during which a chunk was taken out.  She has diabetes and is currently taking Ozempic . She has been mostly staying in bed due to her symptoms. She managed to eat a little ice cream provided by the nurse after the vomiting episode.  No neck pain, stiffness, or shortness of breath.  In the emergency department patient was noted to be febrile up to 102.6 F with tachypnea, and all other vital signs relatively maintained.  Labs noted WBC 4.8, platelet count 100, lactic acid 1.3, BUN 23, creatinine 1.11, and lipase 25.  Urinalysis noted small hemoglobin, no bacteria seen, and 11-20 RBCs/hpf.  Chest x-ray noted stable cardiomegaly without acute abnormality noted.  Influenza, COVID-19, RSV screening were negative.  CT imaging of the head was negative for any acute  abnormality blood cultures were obtained.  Doppler ultrasound of lower extremities did not reveal any signs for DVT.  Patient had been given 1 L of lactated Ringer 's, Tylenol  650 mg p.o., and empiric antibiotics of Rocephin .  Review of Systems: As mentioned in the history of present illness. All other systems reviewed and are negative. Past Medical History:  Diagnosis Date   Anxiety    Arthritis    knee, hip   Cellulitis 08/2016   left leg   Complication of anesthesia    Diabetes mellitus without complication (HCC)    GERD (gastroesophageal reflux disease)    Heart murmur    slight   informed years ago.-    Hernia, umbilical    History of atrial fibrillation 2007   with flagyl   History of endometriosis    Hypercholesteremia    Hypertension    has not been to a cardiologist   PONV (postoperative nausea and vomiting)    Rectal vaginal fistula    Refusal of blood transfusions as patient is Jehovah's Witness    Sleep apnea    Past Surgical History:  Procedure Laterality Date   APPENDECTOMY     BUNIONECTOMY Right 01/09/2019   Procedure: RIGHT FOOT BUNION CORRECTION;  Surgeon: Elsa Lonni SAUNDERS, MD;  Location: [facility] OR;  Service: Orthopedics;  Laterality: Right;   CHOLECYSTECTOMY N/A 03/16/2021   Procedure: LAPAROSCOPIC CHOLECYSTECTOMY;  Surgeon: Aron Shoulders, MD;  Location: [facility] ORS;  Service: General;  Laterality: N/A;   ENDOSCOPIC RETROGRADE CHOLANGIOPANCREATOGRAPHY (ERCP) WITH PROPOFOL  N/A 03/15/2021   Procedure: ENDOSCOPIC RETROGRADE CHOLANGIOPANCREATOGRAPHY (ERCP) WITH PROPOFOL ;  Surgeon: Saintclair Jasper, MD;  Location: [facility];  Service: Gastroenterology;  Laterality: N/A;   EXPLORATORY LAPAROTOMY  1973   for endometrosis   HAMMERTOE RECONSTRUCTION WITH WEIL OSTEOTOMY Right 01/09/2019   Procedure: RIGHT FOOT 2-4 HAMMERTOE CORRECTION WITH WEIL OSTEOTOMY 2ND METATARSAL, DEEP TENDON TRANSFER WITH AKIN OSTEOTOMY;  Surgeon: Elsa Lonni SAUNDERS, MD;  Location: [facility] OR;  Service:  Orthopedics;  Laterality: Right;   I & D EXTREMITY Right 06/10/2016   Procedure: IRRIGATION AND DEBRIDEMENT THUMB FLEXOR SHEATH;  Surgeon: Franky Curia, MD;  Location: [facility] OR;  Service: Orthopedics;  Laterality: Right;   I & D EXTREMITY Right 04/23/2022   Procedure: RIGHT LEG DEBRIDEMENT;  Surgeon: Harden Jerona GAILS, MD;  Location[facility] OR;  Service: Orthopedics;  Laterality: Right;   JOINT REPLACEMENT     Left Hip   MANDIBLE SURGERY  1978   rectovaginal fistula repair  1980   REMOVAL OF STONES  03/15/2021   Procedure: REMOVAL OF STONES;  Surgeon: Saintclair Jasper, MD;  Location: [facility];  Service: Gastroenterology;;   ANNETT  03/15/2021   Procedure: ANNETT;  Surgeon: Saintclair Jasper, MD;  Location: [facility];  Service: Gastroenterology;;   TOTAL HIP ARTHROPLASTY  05/08/2012   Procedure: TOTAL HIP ARTHROPLASTY;  Surgeon: Dempsey JINNY Sensor, MD;  Location: [facility] OR;  Service: Orthopedics;  Laterality: Left;   TOTAL HIP ARTHROPLASTY Right 09/18/2018   Procedure: TOTAL HIP ARTHROPLASTY ANTERIOR APPROACH;  Surgeon: Sensor Dempsey, MD;  Location: [facility] ORS;  Service: Orthopedics;  Laterality: Right;   TOTAL KNEE ARTHROPLASTY Right 01/24/2013   Dr Sensor   TOTAL KNEE ARTHROPLASTY Right 01/24/2013   Procedure: RIGHT TOTAL KNEE ARTHROPLASTY;  Surgeon: Dempsey JINNY Sensor, MD;  Location: [facility] OR;  Service: Orthopedics;  Laterality: Right;   Social History:  reports that she quit smoking about 42 years ago. Her smoking use included cigarettes. She started smoking about 52 years ago. She has a 10 pack-year smoking history. She has never used smokeless tobacco. She reports that she does not drink alcohol and does not use drugs.  Allergies  Allergen Reactions   Other     Refuses whole blood but does accept albumin    Atorvastatin Other (See Comments)   Metronidazole Other (See Comments)    Chest pain, A-fib    Oxybutynin  Other (See Comments)   Septra [Sulfamethoxazole-Trimethoprim] Other (See Comments)    Blisters      Family History  Problem Relation Age of Onset   Dementia Mother    Cancer Father    Breast cancer Neg Hx     Prior to Admission medications   Medication Sig Start Date End Date Taking? Authorizing Provider  cephALEXin  (KEFLEX ) 500 MG capsule Take 1 capsule (500 mg total) by mouth 3 (three) times daily for 5 days. 06/10/24 06/15/24 Yes Jerral Meth, MD  Cholecalciferol  (VITAMIN D) 50 MCG (2000 UT) tablet Take 2,000 Units by mouth daily.     [provider]  cyanocobalamin  1000 MCG tablet Take 1,000 mcg by mouth daily.     [provider]  escitalopram  (LEXAPRO ) 20 MG tablet Take 20 mg by mouth daily.    [provider]  fluticasone  (FLONASE ) 50 MCG/ACT nasal spray Place 2 sprays into both nostrils daily as needed for allergies. 07/02/19   [provider]  furosemide  (LASIX ) 20 MG tablet Take 20 mg by mouth daily. 09/23/19   [provider]  gabapentin  (NEURONTIN ) 300 MG capsule Take 300 mg by mouth 3 (three) times daily. 07/25/19   [provider]  ibuprofen  (  ADVIL ) 200 MG tablet Take 400 mg by mouth 3 (three) times daily.    [provider]  metoprolol  succinate (TOPROL  XL) 25 MG 24 hr tablet Take 0.5 tablets (12.5 mg total) by mouth daily. Patient taking differently: Take 25 mg by mouth daily. 05/06/22   Vann, Jessica U, DO  omeprazole (PRILOSEC) 20 MG capsule Take 20 mg by mouth 2 (two) times daily before a meal.    [provider]  OZEMPIC , 1 MG/DOSE, 4 MG/3ML SOPN Inject 2 mg into the skin once a week. 06/19/21   [provider]  potassium chloride  (KLOR-CON ) 10 MEQ tablet Take 10 mEq by mouth daily.    [provider]  rosuvastatin  (CRESTOR ) 5 MG tablet Take 5 mg by mouth at bedtime.    [provider]    Physical Exam: Vitals:   06/10/24 0730 06/10/24 0811 06/10/24 1000 06/10/24 1003  BP: 123/68  (!) 118/54   Pulse: 89  78   Resp: (!) 29 (!) 27 (!) 21   Temp:  (!) 101.8 F (38.8  C)  98.6 F (37 C)  TempSrc:  Oral  Oral  SpO2: 96%  94%   Weight:      Height:        Constitutional: Elderly female who appears ill, but in NAD, calm, comfortable Eyes: PERRL, lids and conjunctivae normal ENMT: Mucous membranes are moist. Normal dentition.  Neck: normal, supple, no masses, no thyromegaly Respiratory: clear to auscultation bilaterally, no wheezing, no crackles.  No accessory muscle use.  Cardiovascular: Regular rate and rhythm, positive 2/6 systolic murmur present.  Swelling noted of the right leg. Abdomen: no tenderness, no masses palpated. Bowel sounds positive.  Musculoskeletal: no clubbing / cyanosis. No joint deformity upper and lower extremities. Good ROM, no contractures. Normal muscle tone.  Skin: Erythema with increased warmth noted of the right knee without signs of drainage present as seen below  Neurologic: CN 2-12 grossly intact. Strength 5/5 in all 4.  Psychiatric: Normal judgment and insight. Alert and oriented x 3. Normal mood.    Data Reviewed:  EKG reveals sinus rhythm with premature atrial complexes at 72 bpm with incomplete right bundle branch block. reviewed labs, imaging, and pertinent records as documented.  Assessment and Plan:  Severe sepsis secondary to cellulitis Patient presented with fever up to 102.6 F with tachypnea meeting SIRS criteria.  Reports redness and swelling of the right leg which is thought to be the likely source of symptoms.  Lactic acid was reassuring, but total bilirubin was greater than 2 at 2.2 meeting criteria for signs of endorgan dysfunction.  Patient had been given Rocephin  1 g IV.  Patient has had cellulitis in the same infected leg previously. - Admit to a telemetry bed - Follow-up blood cultures - Add on differential, ESR, and CRP - Add vancomycin  and continue Rocephin  with dose increased to 2 g IV every 8 hours - Tylenol  as needed for fever  Acute kidney injury Creatinine noted to be elevated up to the  1.11 with BUN 23.  Baseline creatinine previously noted to be 0.73 when checked on 04/09/2024.  Greater than 0.3 increased from baseline to suggest acute kidney injury.  Suspect secondary to nausea and vomiting and fever.  Patient had been bolused 1 L of IV fluids. - Avoid possible nephrotoxic agents - Normal saline IV fluids at 75 mL/h for additional 1 L of - Continue to monitor kidney function  Nausea and vomiting On physical exam patient without significant  tenderness to palpation.  Lipase noted to be within normal limits.  Urinalysis did not show any signs for infection. - Antiemetics as needed - Diet as tolerated  Essential hypertension Blood pressures are currently maintained. - Continue metoprolol  - Held Lasix  due to possible AKI.  Determine when medically appropriate to resume.  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  No recent hemoglobin A1c available.  Patient is not on any medications for treatment at baseline. - add-on hemoglobin A1c - Continue gabapentin   Hyperlipidemia - Continue Crestor  obesity  GERD - Continue Protonix   Thrombocytopenia Acute.  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 Inpatient status is judged to be reasonable and necessary in order to provide the required intensity of service to ensure the patient's safety. The patient's presenting symptoms, physical exam findings, and initial radiographic and laboratory data in the context of their chronic comorbidities is felt to place them at high risk for further clinical  deterioration. Furthermore, it is not anticipated that the patient will be medically stable for discharge from the hospital within 2 midnights of admission.   * I certify that at the point of admission it is my clinical judgment that the patient will require inpatient hospital care spanning beyond 2 midnights from the point of admission due to high intensity of service, high risk for further deterioration and high frequency of surveillance required.*  Author: Maximino DELENA Sharps, MD 06/10/2024 10:43 AM  For on call review [email or web address].

## 2024-06-10 NOTE — Progress Notes (Addendum)
 New admit from ED. RLE with swelling/erythema. Denies pain.  VSS, afebrile.  New onset of generalized rash and itchiness, she reports this started in the ED.  Wound noted on right 2nd toe, picture taken. WOCN consulted.  MD Claudene notified. IV benadryl  ordered and administered.  Bed placed in low/locked position. Call bell within reach.  All needs met.

## 2024-06-10 NOTE — Progress Notes (Signed)
 Right venous duplex  has been completed. Refer to [facility] under chart review to view preliminary results.   06/10/2024  9:26 AM Charleston Hankin, Ricka BIRCH

## 2024-06-10 NOTE — Progress Notes (Signed)
 Pharmacy Antibiotic Note  Kathleen Wiley is a 78 y.o. female for which pharmacy has been consulted for vancomycin  dosing for cellulitis.  Patient with a history of HTN, HLD, AF, T2DM, myelodysplastic syndrome. Patient presenting with weakness, chills, vomiting.  SCr 1.11 - slightly elevated from baseline WBC 4.8; LA 1.3; T 102.6>98.4; HR 76; RR 29>21 COVID neg / flu neg  Plan: Ceftriaxone  per MD Vancomycin  2000 mg once then 1000 mg q24hr (eAUC 536.1) unless change in renal function Monitor WBC, fever, renal function, cultures De-escalate when able Levels at steady state  Height: 5' 6 (167.6 cm) Weight: 99.8 kg (220 lb) IBW/kg (Calculated) : 59.3  Temp (24hrs), Avg:100.4 F (38 C), Min:98.4 F (36.9 C), Max:102.6 F (39.2 C)  Recent Labs  Lab 06/10/24 0531 06/10/24 0904  WBC 4.8  --   CREATININE 1.11*  --   LATICACIDVEN  --  1.3    Estimated Creatinine Clearance: 50.6 mL/min (A) (by C-G formula based on SCr of 1.11 mg/dL (H)).    Allergies  Allergen Reactions   Other     Refuses whole blood but does accept albumin    Ditropan  [Oxybutynin ] Other (See Comments)    Unknown reaction   Flagyl [Metronidazole] Other (See Comments)    Chest pain Induced A-fib    Lipitor [Atorvastatin] Other (See Comments)    Unknown reaction   Septra [Sulfamethoxazole-Trimethoprim] Rash    Blistering rash    Microbiology results: Pending  Thank you for allowing pharmacy to be a part of this patient's care.  Dorn Buttner, PharmD, BCPS 06/10/2024 12:01 PM ED Clinical Pharmacist -  [phone]

## 2024-06-10 NOTE — ED Provider Notes (Signed)
 [facility] Provider Note   CSN: [id number] Arrival date & time: 06/10/24  0451     History Chief Complaint  Patient presents with   Weakness    HPI Kathleen Wiley is a 78 y.o. female presenting for chief complaint of malasie. Couple episodes of nausea vomitting No pain Generalized weakness today Had chills throughout the day yesterday but had gotten better throughout the night.   Patient's recorded medical, surgical, social, medication list and allergies were reviewed in the Snapshot window as part of the initial history.   Review of Systems   Review of Systems  Constitutional:  Positive for fatigue. Negative for chills and fever.  HENT:  Negative for ear pain and sore throat.   Eyes:  Negative for pain and visual disturbance.  Respiratory:  Negative for cough and shortness of breath.   Cardiovascular:  Positive for leg swelling. Negative for chest pain and palpitations.  Gastrointestinal:  Negative for abdominal pain and vomiting.  Genitourinary:  Negative for dysuria and hematuria.  Musculoskeletal:  Negative for arthralgias and back pain.  Skin:  Positive for rash. Negative for color change.  Neurological:  Positive for weakness. Negative for seizures and syncope.  All other systems reviewed and are negative.   Physical Exam Updated Vital Signs BP 134/66 (BP Location: Right Arm)   Pulse 98   Temp 98.4 F (36.9 C) (Oral)   Resp 18   Ht 5' 6 (1.676 m)   Wt 99.8 kg   SpO2 92%   BMI 35.51 kg/m  Physical Exam Vitals and nursing note reviewed.  Constitutional:      General: She is not in acute distress.    Appearance: She is well-developed.  HENT:     Head: Normocephalic and atraumatic.  Eyes:     Conjunctiva/sclera: Conjunctivae normal.  Cardiovascular:     Rate and Rhythm: Normal rate and regular rhythm.     Heart sounds: No murmur heard. Pulmonary:     Effort: Pulmonary effort is normal. No respiratory  distress.     Breath sounds: Normal breath sounds.  Abdominal:     General: There is no distension.     Palpations: Abdomen is soft.     Tenderness: There is no abdominal tenderness. There is no right CVA tenderness or left CVA tenderness.  Musculoskeletal:        General: Swelling present. No tenderness. Normal range of motion.     Cervical back: Neck supple.  Skin:    General: Skin is warm and dry.     Findings: Erythema present.  Neurological:     General: No focal deficit present.     Mental Status: She is alert and oriented to person, place, and time. Mental status is at baseline.     Cranial Nerves: No cranial nerve deficit.      ED Course/ Medical Decision Making/ A&P    Procedures Procedures   Medications Ordered in ED Medications  cefTRIAXone  (ROCEPHIN ) 1 g in sodium chloride  0.9 % 100 mL IVPB (has no administration in time range)  lactated ringers  bolus 1,000 mL (1,000 mLs Intravenous New Bag/Given 06/10/24 0602)    Medical Decision Making:   This is a 78 year old female presenting with acute onset of right lower extremity swelling and pain.  States that it suddenly became warm to the touch, severely painful and caused systemic chills and fatigue throughout the day yesterday.  Has a history of cellulitis in this extremity in the  past. Denies systemic fevers at this time. Shared medical decision making with patient, her history of present illness and physical exam findings are most consistent with cellulitis versus DVT.  DVT study ordered and antibiotics ordered for likely cellulitic component. Patient would like to see how she feels after the first dose of antibiotics and is hopeful she would be able to go home that she will require reassessment by the oncoming team as well as follow-up with DVT study.    Clinical Impression:  1. Generalized weakness   2. Localized swelling of right lower extremity   3. Cellulitis, unspecified cellulitis site      Data  Unavailable   Final Clinical Impression(s) / ED Diagnoses Final diagnoses:  Generalized weakness  Localized swelling of right lower extremity  Cellulitis, unspecified cellulitis site    Rx / DC Orders ED Discharge Orders          Ordered    cephALEXin  (KEFLEX ) 500 MG capsule  3 times daily        06/10/24 9351              Jerral Meth, MD 06/10/24 [phone]

## 2024-06-10 NOTE — Progress Notes (Signed)
 Pt with fever 102.9  MD Claudene notified.  Awaiting new orders.

## 2024-06-10 NOTE — Plan of Care (Signed)
 Wound dressing completed as per orders.  Problem: Education: Goal: Knowledge of General Education information will improve Description: Including pain rating scale, medication(s)/side effects and non-pharmacologic comfort measures Outcome: Progressing   Problem: Health Behavior/Discharge Planning: Goal: Ability to manage health-related needs will improve Outcome: Progressing   Problem: Clinical Measurements: Goal: Ability to maintain clinical measurements within normal limits will improve Outcome: Progressing Goal: Will remain free from infection Outcome: Progressing Goal: Diagnostic test results will improve Outcome: Progressing Goal: Respiratory complications will improve Outcome: Progressing Goal: Cardiovascular complication will be avoided Outcome: Progressing

## 2024-06-11 DIAGNOSIS — L03115 Cellulitis of right lower limb: Secondary | ICD-10-CM | POA: Diagnosis not present

## 2024-06-11 LAB — CBC WITH DIFFERENTIAL/PLATELET
Basophils Absolute: 0 K/uL (ref 0.0–0.1)
Basophils Relative: 0 %
Eosinophils Absolute: 0 K/uL (ref 0.0–0.5)
Eosinophils Relative: 1 %
HCT: 32.4 % — ABNORMAL LOW (ref 36.0–46.0)
Hemoglobin: 10.7 g/dL — ABNORMAL LOW (ref 12.0–15.0)
Lymphocytes Relative: 8 %
Lymphs Abs: 0.2 K/uL — ABNORMAL LOW (ref 0.7–4.0)
MCH: 31.8 pg (ref 26.0–34.0)
MCHC: 33 g/dL (ref 30.0–36.0)
MCV: 96.1 fL (ref 80.0–100.0)
Monocytes Absolute: 0 K/uL — ABNORMAL LOW (ref 0.1–1.0)
Monocytes Relative: 0 %
Neutro Abs: 2.5 K/uL (ref 1.7–7.7)
Neutrophils Relative %: 91 %
Platelets: 67 K/uL — ABNORMAL LOW (ref 150–400)
RBC: 3.37 MIL/uL — ABNORMAL LOW (ref 3.87–5.11)
RDW: 14.6 % (ref 11.5–15.5)
WBC: 2.8 K/uL — ABNORMAL LOW (ref 4.0–10.5)
nRBC: 0 % (ref 0.0–0.2)

## 2024-06-11 LAB — COMPREHENSIVE METABOLIC PANEL WITH GFR
ALT: 18 U/L (ref 0–44)
AST: 23 U/L (ref 15–41)
Albumin: 2.6 g/dL — ABNORMAL LOW (ref 3.5–5.0)
Alkaline Phosphatase: 56 U/L (ref 38–126)
Anion gap: 9 (ref 5–15)
BUN: 19 mg/dL (ref 8–23)
CO2: 21 mmol/L — ABNORMAL LOW (ref 22–32)
Calcium: 7.7 mg/dL — ABNORMAL LOW (ref 8.9–10.3)
Chloride: 104 mmol/L (ref 98–111)
Creatinine, Ser: 0.96 mg/dL (ref 0.44–1.00)
GFR, Estimated: >60 mL/min (ref >60)
Glucose, Bld: 189 mg/dL — ABNORMAL HIGH (ref 70–99)
Potassium: 3.3 mmol/L — ABNORMAL LOW (ref 3.5–5.1)
Sodium: 134 mmol/L — ABNORMAL LOW (ref 135–145)
Total Bilirubin: 1.4 mg/dL — ABNORMAL HIGH (ref 0.0–1.2)
Total Protein: 5.5 g/dL — ABNORMAL LOW (ref 6.5–8.1)

## 2024-06-11 MED ORDER — LINEZOLID 600 MG PO TABS
600.0000 mg | ORAL_TABLET | Freq: Two times a day (BID) | ORAL | Status: DC
  Administered 2024-06-11 – 2024-06-13 (×5): 600 mg via ORAL
  Filled 2024-06-11 (×5): qty 1

## 2024-06-11 MED ORDER — METOPROLOL TARTRATE 5 MG/5ML IV SOLN: 5.0000 mg | INTRAVENOUS | Status: DC | PRN

## 2024-06-11 MED ORDER — DICLOFENAC SODIUM 1 % EX GEL
2.0000 g | Freq: Four times a day (QID) | CUTANEOUS | Status: AC
  Administered 2024-06-11 – 2024-06-12 (×3): 2 g via TOPICAL
  Filled 2024-06-11: qty 100

## 2024-06-11 MED ORDER — POTASSIUM CHLORIDE CRYS ER 10 MEQ PO TBCR
40.0000 meq | EXTENDED_RELEASE_TABLET | Freq: Once | ORAL | Status: AC
  Administered 2024-06-11: 40 meq via ORAL
  Filled 2024-06-11: qty 4

## 2024-06-11 MED ORDER — ONDANSETRON HCL 4 MG/2ML IJ SOLN: 4.0000 mg | Freq: Four times a day (QID) | INTRAMUSCULAR | Status: DC | PRN

## 2024-06-11 MED ORDER — VANCOMYCIN HCL 1250 MG/250ML IV SOLN
1250.0000 mg | INTRAVENOUS | Status: DC
  Filled 2024-06-11: qty 250

## 2024-06-11 MED ORDER — HYDRALAZINE HCL 20 MG/ML IJ SOLN: 10.0000 mg | INTRAMUSCULAR | Status: DC | PRN

## 2024-06-11 MED ORDER — GLUCAGON HCL RDNA (DIAGNOSTIC) 1 MG IJ SOLR: 1.0000 mg | INTRAMUSCULAR | Status: DC | PRN

## 2024-06-11 MED ORDER — SIMETHICONE 80 MG PO CHEW
80.0000 mg | CHEWABLE_TABLET | Freq: Once | ORAL | Status: AC
  Administered 2024-06-11: 80 mg via ORAL
  Filled 2024-06-11: qty 1

## 2024-06-11 MED ORDER — SENNOSIDES-DOCUSATE SODIUM 8.6-50 MG PO TABS: 1.0000 | ORAL_TABLET | Freq: Every evening | ORAL | Status: DC | PRN

## 2024-06-11 MED ORDER — IPRATROPIUM-ALBUTEROL 0.5-2.5 (3) MG/3ML IN SOLN: 3.0000 mL | RESPIRATORY_TRACT | Status: DC | PRN

## 2024-06-11 MED ORDER — PHENOL 1.4 % MT LIQD
1.0000 | OROMUCOSAL | Status: DC | PRN
  Administered 2024-06-11: 1 via OROMUCOSAL
  Filled 2024-06-11: qty 177

## 2024-06-11 MED ORDER — SODIUM CHLORIDE 0.9 % IV SOLN: INTRAVENOUS | Status: AC

## 2024-06-11 NOTE — Progress Notes (Signed)
 Pharmacy Antibiotic Note  Kathleen Wiley is a 78 y.o. female for which pharmacy has been consulted for vancomycin  dosing for cellulitis.  Patient with a history of HTN, HLD, AF, T2DM, myelodysplastic syndrome. Patient presenting with weakness, chills, vomiting.  SCr 0.96 - slightly elevated from baseline WBC 4.8; LA 1.3; T 102.6>98.4; HR 76; RR 29>21 COVID neg / flu neg  Plan: Change Vancomycin  1250 mg q24hr (eAUC 473) Monitor WBC, fever, renal function, cultures De-escalate when able Levels at steady state  Height: 5' 6 (167.6 cm) Weight: 99.8 kg (220 lb) IBW/kg (Calculated) : 59.3  Temp (24hrs), Avg:99.2 F (37.3 C), Min:98.2 F (36.8 C), Max:102.9 F (39.4 C)  Recent Labs  Lab 06/10/24 0531 06/10/24 0904 06/10/24 1454 06/11/24 0749  WBC 4.8  --  2.2* 2.8*  CREATININE 1.11*  --   --  0.96  LATICACIDVEN  --  1.3  --   --     Estimated Creatinine Clearance: 58.5 mL/min (by C-G formula based on SCr of 0.96 mg/dL).    Allergies  Allergen Reactions   Other     Refuses whole blood but does accept albumin    Ditropan  [Oxybutynin ] Other (See Comments)    Unknown reaction   Flagyl [Metronidazole] Other (See Comments)    Chest pain Induced A-fib    Lipitor [Atorvastatin] Other (See Comments)    Unknown reaction   Septra [Sulfamethoxazole-Trimethoprim] Rash    Blistering rash    Microbiology results: 8/3 bcx- ngtd<24H  Thank you for allowing pharmacy to be a part of this patient's care.   Benedetta Heath BS, PharmD, BCPS Clinical Pharmacist 06/11/2024 9:37 AM  Contact: [phone] after 3 PM

## 2024-06-11 NOTE — Progress Notes (Signed)
 PROGRESS NOTE    Kathleen Wiley  FMW:[id number] DOB: [birth year] DOA: 06/10/2024 PCP: Verena Mems, MD    Brief Narrative:   78 y.o. female with medical history significant of hypertension, hyperlipidemia, paroxysmal atrial fibrillation, diabetes mellitus type 2, myelodysplastic syndrome, prior h/o cellulitis presents with chills and vomiting.  In the ER patient noted to have sepsis secondary to right lower extremity cellulitis.  Started on Rocephin .  Lower extremity Dopplers were negative for DVT.  Assessment & Plan:  Principal Problem:   Cellulitis of right lower extremity Active Problems:   Severe sepsis (HCC)   AKI (acute kidney injury) (HCC)   Nausea and vomiting   Essential hypertension, benign   Controlled type 2 diabetes mellitus with neuropathy (HCC)   Hypercholesterolemia   GERD (gastroesophageal reflux disease)   Obesity with body mass index (BMI) of 30.0 to 39.9   Sepsis (HCC)     Severe sepsis secondary to right lower extremity cellulitis without purulent discharge Sepsis physiology slowly improving.  Continue to follow culture data - Current antibiotics-vancomycin /Rocephin .  Check MRSA swab - Inflammatory markers slightly elevated -Lower extremity Dopplers negative   Acute kidney injury -Baseline creatinine 0.7, admission creatinine 1.1 -Continue IV fluids  Diffuse rash - Wonder if this is Redman syndrome.  Will try and switch patient from vancomycin  to linezolid .  Will discuss with pharmacy   Nausea and vomiting Elevated total bilirubin, improving -Continue to monitor.   Essential hypertension -On metoprolol .  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  Will continue to monitor.  Follows with outpatient Dr. 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  Reporting of some rash on her back and her thigh area.   Examination:  General exam: Appears calm and comfortable  Respiratory system: Clear to auscultation. Respiratory effort normal. Cardiovascular system: S1 & S2 heard, RRR. No JVD, murmurs, rubs, gallops or clicks. No pedal edema. Gastrointestinal system: Abdomen is nondistended, soft and nontender. No organomegaly or masses felt. Normal bowel sounds heard. Central nervous system: Alert and oriented. No focal neurological deficits. Extremities: Symmetric 5 x 5 power. Skin: Normal specific rash on her thigh and back Psychiatry: Judgement and insight appear normal. Mood & affect appropriate.                Diet Orders (From admission, onward)     Start     Ordered   06/10/24 1057  Diet Carb Modified Fluid consistency: Thin; Room service appropriate? Yes  Diet effective now       Question Answer Comment  Diet-HS Snack? Nothing   Calorie Level Medium 1600-2000   Fluid consistency: Thin   Room service appropriate? Yes      06/10/24 1059            Objective: Vitals:   06/10/24 2138 06/11/24 0033 06/11/24 0558 06/11/24 0827  BP: (!) 116/49 (!) 106/37 (!) 123/52 (!) 116/45  Pulse: 72 71 75 77  Resp: 18 18 18 18   Temp: 98.2 F (36.8 C) 98.3 F (36.8 C) 98.3 F (36.8 C) 98.2 F (36.8 C)  TempSrc: Oral Oral Oral Oral  SpO2: 96% 96% 93% 97%  Weight:      Height:        Intake/Output Summary (Last 24 hours) at 06/11/2024 1041 Last data filed at 06/11/2024 0900 Gross per 24 hour  Intake 840 ml  Output --  Net 840  ml   Filed Weights   06/10/24 0523  Weight: 99.8 kg    Scheduled Meds:  escitalopram   20 mg Oral QHS   gabapentin   300 mg Oral BID   metoprolol  succinate  25 mg Oral QHS   pantoprazole   40 mg Oral Daily   rosuvastatin   5 mg Oral QHS   sodium chloride  flush  3 mL Intravenous Q12H   Continuous Infusions:  sodium chloride  75 mL/hr at 06/11/24 0837   cefTRIAXone  (ROCEPHIN )  IV     vancomycin       Nutritional status     Body mass index is 35.51 kg/m.  Data Reviewed:   CBC: Recent Labs  Lab 06/10/24 0531 06/10/24 1454 06/11/24 0749  WBC 4.8 2.2* 2.8*  NEUTROABS  --  1.5* 2.5  HGB 12.0 10.6* 10.7*  HCT 36.9 31.2* 32.4*  MCV 97.6 97.2 96.1  PLT 100* 66* 67*   Basic Metabolic Panel: Recent Labs  Lab 06/10/24 0531 06/11/24 0749  NA 136 134*  K 3.7 3.3*  CL 102 104  CO2 20* 21*  GLUCOSE 160* 189*  BUN 23 19  CREATININE 1.11* 0.96  CALCIUM  8.2* 7.7*   GFR: Estimated Creatinine Clearance: 58.5 mL/min (by C-G formula based on SCr of 0.96 mg/dL). Liver Function Tests: Recent Labs  Lab 06/10/24 0531 06/11/24 0749  AST 34 23  ALT 23 18  ALKPHOS 68 56  BILITOT 2.2* 1.4*  PROT 6.2* 5.5*  ALBUMIN  3.1* 2.6*   Recent Labs  Lab 06/10/24 0600  LIPASE 25   No results for input(s): AMMONIA in the last 168 hours. Coagulation Profile: No results for input(s): INR, PROTIME in the last 168 hours. Cardiac Enzymes: No results for input(s): CKTOTAL, CKMB, CKMBINDEX, TROPONINI in the last 168 hours. BNP (last 3 results) No results for input(s): PROBNP in the last 8760 hours. HbA1C: No results for input(s): HGBA1C in the last 72 hours. CBG: Recent Labs  Lab 06/10/24 0537  GLUCAP 173*   Lipid Profile: No results for input(s): CHOL, HDL, LDLCALC, TRIG, CHOLHDL, LDLDIRECT in the last 72 hours. Thyroid  Function Tests: No results for input(s): TSH, T4TOTAL, FREET4, T3FREE, THYROIDAB in the last 72 hours. Anemia Panel: No  results for input(s): VITAMINB12, FOLATE, FERRITIN, TIBC, IRON, RETICCTPCT in the last 72 hours. Sepsis Labs: Recent Labs  Lab 06/10/24 9095  LATICACIDVEN 1.3    Recent Results (from the past 240 hours)  Resp panel by RT-PCR (RSV, Flu A&B, Covid) Anterior Nasal Swab     Status: None   Collection Time: 06/10/24  6:00 AM   Specimen: Anterior Nasal Swab  Result Value Ref Range Status   SARS Coronavirus 2 by RT PCR NEGATIVE NEGATIVE Final   Influenza A by PCR NEGATIVE NEGATIVE Final   Influenza B by PCR NEGATIVE NEGATIVE Final    Comment: (NOTE) The Xpert Xpress SARS-CoV-2/FLU/RSV plus assay is intended as an aid in the diagnosis of influenza from Nasopharyngeal swab specimens and should not be used as a sole basis for treatment. Nasal washings and aspirates  are unacceptable for Xpert Xpress SARS-CoV-2/FLU/RSV testing.  Fact Sheet for Patients: BloggerCourse.com  Fact Sheet for Healthcare Providers: SeriousBroker.it  This test is not yet approved or cleared by the [place]  FDA and has been authorized for detection and/or diagnosis of SARS-CoV-2 by FDA under an Emergency Use Authorization (EUA). This EUA will remain in effect (meaning this test can be used) for the duration of the COVID-19 declaration under Section 564(b)(1) of the Act, 21 U.S.C. section 360bbb-3(b)(1), unless the authorization is terminated or revoked.     Resp Syncytial Virus by PCR NEGATIVE NEGATIVE Final    Comment: (NOTE) Fact Sheet for Patients: BloggerCourse.com  Fact Sheet for Healthcare Providers: SeriousBroker.it  This test is not yet approved or cleared by the [place]  FDA and has been authorized for detection and/or diagnosis of SARS-CoV-2 by FDA under an Emergency Use Authorization (EUA). This EUA will remain in effect (meaning this test can be used) for the duration of  the COVID-19 declaration under Section 564(b)(1) of the Act, 21 U.S.C. section 360bbb-3(b)(1), unless the authorization is terminated or revoked.  Performed at [facility], [address] [place], [place] [address]   Blood culture (routine x 2)     Status: None (Preliminary result)   Collection Time: 06/10/24  6:47 AM   Specimen: BLOOD LEFT FOREARM  Result Value Ref Range Status   Specimen Description BLOOD LEFT FOREARM  Final   Special Requests   Final    BOTTLES DRAWN AEROBIC AND ANAEROBIC Blood Culture results may not be optimal due to an inadequate volume of blood received in culture bottles   Culture   Final    NO GROWTH < 24 HOURS Performed at [facility], [address] [place], [place] [address]    Report Status PENDING  Incomplete  Blood culture (routine x 2)     Status: None (Preliminary result)   Collection Time: 06/10/24  6:52 AM   Specimen: BLOOD RIGHT ARM  Result Value Ref Range Status   Specimen Description BLOOD RIGHT ARM  Final   Special Requests   Final    BOTTLES DRAWN AEROBIC ONLY Blood Culture results may not be optimal due to an inadequate volume of blood received in culture bottles   Culture   Final    NO GROWTH < 24 HOURS Performed at [facility], [address] [place], [place] [address]    Report Status PENDING  Incomplete         Radiology Studies: VAS US  LOWER EXTREMITY VENOUS (DVT) (ONLY [facility] & [facility]) Result Date: 06/10/2024  Lower Venous DVT Study Patient Name:  Kennidy J Standley  Date of Exam:   06/10/2024 Medical Rec #: [id number]        Accession #:    [id number] Date of Birth: February 27, 1946        Patient Gender: F Patient Age:   78 years Exam Location:  [facility] Procedure:      VAS US  LOWER EXTREMITY VENOUS (DVT) Referring Phys: CYNDEE COUNTRYMAN --------------------------------------------------------------------------------  Indications: Swelling, Pain, and redness in calf.  Comparison Study: 04/21/19 - Negative Performing  Technologist: Ricka Sturdivant-Jones RDMS, RVT  Examination Guidelines: A complete evaluation includes B-mode imaging, spectral Doppler, color Doppler, and power Doppler as needed of all accessible portions of each vessel. Bilateral testing is considered an integral part of a complete examination. Limited examinations for reoccurring indications may be performed as noted. The reflux portion of the exam is performed with the  patient in reverse Trendelenburg.  +---------+---------------+---------+-----------+----------+--------------+ RIGHT    CompressibilityPhasicitySpontaneityPropertiesThrombus Aging +---------+---------------+---------+-----------+----------+--------------+ CFV      Full           Yes      Yes                                 +---------+---------------+---------+-----------+----------+--------------+ SFJ      Full                                                        +---------+---------------+---------+-----------+----------+--------------+ FV Prox  Full                                                        +---------+---------------+---------+-----------+----------+--------------+ FV Mid   Full                                                        +---------+---------------+---------+-----------+----------+--------------+ FV DistalFull                                                        +---------+---------------+---------+-----------+----------+--------------+ PFV      Full                                                        +---------+---------------+---------+-----------+----------+--------------+ POP      Full           Yes      Yes                                 +---------+---------------+---------+-----------+----------+--------------+ PTV      Full                                                        +---------+---------------+---------+-----------+----------+--------------+ PERO     Full                                                         +---------+---------------+---------+-----------+----------+--------------+   +----+---------------+---------+-----------+----------+--------------+ LEFTCompressibilityPhasicitySpontaneityPropertiesThrombus Aging +----+---------------+---------+-----------+----------+--------------+ CFV Full           Yes      Yes                                 +----+---------------+---------+-----------+----------+--------------+  SFJ Full                                                        +----+---------------+---------+-----------+----------+--------------+     Summary: RIGHT: - There is no evidence of deep vein thrombosis in the lower extremity.  - No cystic structure found in the popliteal fossa.  LEFT: - No evidence of common femoral vein obstruction.   *See table(s) above for measurements and observations. Electronically signed by Debby Robertson on 06/10/2024 at 3:24:34 PM.    Final    DG Chest Portable 1 View Result Date: 06/10/2024 CLINICAL DATA:  78 year old female with chest pain and weakness. Hospitalized for meningitis in June. EXAM: PORTABLE CHEST 1 VIEW COMPARISON:  Portable chest 04/19/2022 and earlier. FINDINGS: Portable AP view at 0554 hours. Larger, improved lung volumes. Stable cardiomegaly and mediastinal contours. Visualized tracheal air column is within normal limits. Allowing for portable technique the lungs are clear. No pneumothorax. No pleural effusion is evident. No acute osseous abnormality identified. IMPRESSION: Stable cardiomegaly. No acute cardiopulmonary abnormality. Electronically Signed   By: VEAR Hurst M.D.   On: 06/10/2024 06:37   CT HEAD WO CONTRAST ( ) Result Date: 06/10/2024 CLINICAL DATA:  78 year old female with weakness. Hospitalized for meningitis in June. EXAM: CT HEAD WITHOUT CONTRAST TECHNIQUE: Contiguous axial images were obtained from the base of the skull through the vertex without intravenous contrast. RADIATION DOSE  REDUCTION: This exam was performed according to the departmental dose-optimization program which includes automated exposure control, adjustment of the mA and/or kV according to patient size and/or use of iterative reconstruction technique. COMPARISON:  Head CT 04/20/2022. FINDINGS: Brain: Cerebral volume remains normal for age. No midline shift, ventriculomegaly, mass effect, evidence of mass lesion, intracranial hemorrhage or evidence of cortically based acute infarction. Gray-white differentiation stable and normal for age. No cortical encephalomalacia identified. Minimal cerebral white matter hypodensity most apparent in the left frontal lobe and stable. Normal basilar cisterns. Vascular: Calcified atherosclerosis at the skull base. No suspicious intracranial vascular hyperdensity. Skull: Stable and intact. Sinuses/Orbits: Chronic right maxillary sinusitis with mucoperiosteal thickening unchanged. Other sinuses, tympanic cavities and mastoids remain well aerated. Other: No acute orbit or scalp soft tissue finding. IMPRESSION: 1. Stable since 2023 and negative for age noncontrast CT appearance of the brain. 2. Chronic right maxillary sinusitis. Electronically Signed   By: VEAR Hurst M.D.   On: 06/10/2024 06:37           LOS: 1 day   Time spent= 35 mins    Burgess JAYSON Dare, MD [facility]  If 7PM-7AM, please contact night-coverage  06/11/2024, 10:41 AM

## 2024-06-11 NOTE — Evaluation (Signed)
 Physical Therapy Evaluation Patient Details Name: Kathleen Wiley MRN: [id number] DOB: December 06, 1945 Today's Date: 06/11/2024  History of Present Illness  78 y.o. female presents to [facility] hospital on 06/10/2024 with chills and vomiting. Pt admitted with concern for cellulitis of RLE. PMH includes HTN, HLD, PAF, DMII, myelodysplastic syndrome.  Clinical Impression  Pt presents to PT with deficits in strength, power, gait, balance, activity tolerance. Pt is able to transfer and ambulate for household distances with support of a RW. Pt reports dizziness when ambulating, BP was recorded ~1-2 minutes after sitting due to time for monitor retrieval, found to be 128/57. Pt will benefit from frequent mobilization and increase time in an upright position to reduce the risk of orthostatic hypotension. PT recommends discharge home with HHPT when medically appropriate.        If plan is discharge home, recommend the following: A little help with walking and/or transfers;A little help with bathing/dressing/bathroom;Assistance with cooking/housework;Assist for transportation;Help with stairs or ramp for entrance   Can travel by private vehicle        Equipment Recommendations None recommended by PT  Recommendations for Other Services       Functional Status Assessment Patient has had a recent decline in their functional status and demonstrates the ability to make significant improvements in function in a reasonable and predictable amount of time.     Precautions / Restrictions Precautions Precautions: Fall Restrictions Weight Bearing Restrictions Per Provider Order: No      Mobility  Bed Mobility Overal bed mobility: Needs Assistance Bed Mobility: Supine to Sit     Supine to sit: Supervision          Transfers Overall transfer level: Needs assistance Equipment used: Rolling walker (2 wheels) Transfers: Sit to/from Stand Sit to Stand: Contact guard assist, Supervision           General  transfer comment: CGA from bed, supervision from commode    Ambulation/Gait Ambulation/Gait assistance: Contact guard assist Gait Distance (Feet): 100 Feet Assistive device: Rolling walker (2 wheels) Gait Pattern/deviations: Step-through pattern Gait velocity: reduced Gait velocity interpretation: <1.8 ft/sec, indicate of risk for recurrent falls   General Gait Details: slowed step-through gait, distance limited by pt reports of dizziness which began midway through walk  Stairs            Wheelchair Mobility     Tilt Bed    Modified Rankin (Stroke Patients Only)       Balance Overall balance assessment: Needs assistance Sitting-balance support: No upper extremity supported, Feet supported Sitting balance-Leahy Scale: Good     Standing balance support: Single extremity supported, Reliant on assistive device for balance Standing balance-Leahy Scale: Poor                               Pertinent Vitals/Pain Pain Assessment Pain Assessment: Faces Faces Pain Scale: Hurts little more Pain Location: RLE Pain Descriptors / Indicators: Grimacing Pain Intervention(s): Monitored during session    Home Living Family/patient expects to be discharged to:: Private residence Living Arrangements: Spouse/significant other Available Help at Discharge: Available PRN/intermittently Type of Home: House Home Access: Stairs to enter Entrance Stairs-Rails: Can reach both Entrance Stairs-Number of Steps: 3-4   Home Layout: One level Home Equipment: Agricultural consultant (2 wheels);Rollator (4 wheels);BSC/3in1;Cane - single point      Prior Function Prior Level of Function : Independent/Modified Independent;Driving  Mobility Comments: cane outside the home, no AD in the home ADLs Comments: ind with ADLs, manages own meds, cooks, drives,     Extremity/Trunk Assessment   Upper Extremity Assessment Upper Extremity Assessment: Generalized weakness     Lower Extremity Assessment Lower Extremity Assessment: Defer to PT evaluation    Cervical / Trunk Assessment Cervical / Trunk Assessment: Normal  Communication   Communication Communication: No apparent difficulties    Cognition Arousal: Alert Behavior During Therapy: WFL for tasks assessed/performed   PT - Cognitive impairments: No apparent impairments                         Following commands: Intact       Cueing Cueing Techniques: Verbal cues     General Comments General comments (skin integrity, edema, etc.): pt reports dizziness when ambulating, BP taken once seated in recliner, found to be 128/57    Exercises     Assessment/Plan    PT Assessment Patient needs continued PT services  PT Problem List Decreased strength;Decreased activity tolerance;Decreased balance;Decreased mobility;Pain       PT Treatment Interventions DME instruction;Gait training;Stair training;Functional mobility training;Therapeutic activities;Therapeutic exercise;Balance training;Neuromuscular re-education;Patient/family education    PT Goals (Current goals can be found in the Care Plan section)  Acute Rehab PT Goals Patient Stated Goal: to return to independence, improve activity tolerance PT Goal Formulation: With patient Time For Goal Achievement: 06/25/24 Potential to Achieve Goals: Good    Frequency Min 2X/week     Co-evaluation               AM-PAC PT 6 Clicks Mobility  Outcome Measure Help needed turning from your back to your side while in a flat bed without using bedrails?: A Little Help needed moving from lying on your back to sitting on the side of a flat bed without using bedrails?: A Little Help needed moving to and from a bed to a chair (including a wheelchair)?: A Little Help needed standing up from a chair using your arms (e.g., wheelchair or bedside chair)?: A Little Help needed to walk in hospital room?: A Little Help needed climbing 3-5  steps with a railing? : A Little 6 Click Score: 18    End of Session Equipment Utilized During Treatment: Gait belt Activity Tolerance: Patient tolerated treatment well Patient left: in chair;with call bell/phone within reach Nurse Communication: Mobility status PT Visit Diagnosis: Other abnormalities of gait and mobility (R26.89);Muscle weakness (generalized) (M62.81)    Time: 8679-8654 PT Time Calculation (min) (ACUTE ONLY): 25 min   Charges:   PT Evaluation $PT Eval Low Complexity: 1 Low   PT General Charges $$ ACUTE PT VISIT: 1 Visit         Bernardino JINNY Ruth, PT, DPT Acute Rehabilitation Office [phone]   Bernardino JINNY Ruth 06/11/2024, 2:30 PM

## 2024-06-11 NOTE — TOC Initial Note (Signed)
 Transition of Care [facility]) - Initial/Assessment Note    Patient Details  Name: Kathleen Wiley MRN: [id number] Date of Birth: [birth year]  Transition of [facility]) CM/SW Contact:    Jeoffrey LITTIE Moose, LCSW Phone Number: 06/11/2024, 8:53 AM  Clinical Narrative:                 Pt admitted from home due to feeling really tired and weak. No current TOC needs, please consult as needs arise following therapy eval.         Patient Goals and CMS Choice            Expected Discharge Plan and Services                                              Prior Living Arrangements/Services                       Activities of Daily Living   ADL Screening (condition at time of admission) Independently performs ADLs?: Yes (appropriate for developmental age) Is the patient deaf or have difficulty hearing?: No Does the patient have difficulty seeing, even when wearing glasses/contacts?: Yes Does the patient have difficulty concentrating, remembering, or making decisions?: No  Permission Sought/Granted                  Emotional Assessment              Admission diagnosis:  Cellulitis of leg [L03.119] Generalized weakness [R53.1] Cellulitis, unspecified cellulitis site [L03.90] Localized swelling of right lower extremity [R22.41] Sepsis (HCC) [A41.9] Patient Active Problem List   Diagnosis Date Noted   Cellulitis of leg 06/10/2024   Severe sepsis (HCC) 06/10/2024   AKI (acute kidney injury) (HCC) 06/10/2024   Nausea and vomiting 06/10/2024   MDS (myelodysplastic syndrome), low grade (HCC) 07/13/2022   Prolonged QT interval 05/04/2022   Elevated TSH 05/04/2022   Abnormal ECG 05/04/2022   Cellulitis 05/03/2022   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) 05/03/2022   Sinus bradycardia 05/03/2022   Group G streptococcal infection 04/21/2022   Sepsis (HCC) 04/20/2022   Cellulitis of right lower extremity 04/20/2022   Gallstone pancreatitis  03/15/2021   Acute cholecystitis 03/15/2021   Controlled type 2 diabetes mellitus with neuropathy (HCC) 03/15/2021   Obesity with body mass index (BMI) of 30.0 to 39.9 03/15/2021   Acute gallstone pancreatitis 03/15/2021   Abnormal cervical Papanicolaou smear 02/18/2021   Abnormal liver function tests 02/18/2021   Acquired hallux valgus 02/18/2021   Acute hypoxemic respiratory failure (HCC) 02/18/2021   Aneurysm of thoracic aorta (HCC) 02/18/2021   Anxiety 02/18/2021   Aortic root dilatation (HCC) 02/18/2021   Aortic valve disorder 02/18/2021   Arthropathy 02/18/2021   Callosity 02/18/2021   Cardiomegaly 02/18/2021   Chest discomfort 02/18/2021   Chronic kidney disease (CKD) stage G2/A1, mildly decreased glomerular filtration rate (GFR) between 60-89 mL/min/1.73 square meter and albuminuria creatinine ratio less than 30 mg/g 02/18/2021   Chronic kidney disease, stage 2 (mild) 02/18/2021   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   COVID-19 virus infection 02/18/2021   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/18/2021   Edema 02/18/2021   Edema of both lower legs 02/18/2021   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   Leukopenia 02/18/2021   Low back pain 02/18/2021   Malabsorption syndrome 02/18/2021   Metabolic syndrome  02/18/2021   Mild cervical dysplasia 02/18/2021   Mixed anxiety and depressive disorder 02/18/2021   Neuropathy 02/18/2021   Obstructive sleep apnea syndrome 02/18/2021   Senile purpura (HCC) 02/18/2021   Snoring 02/18/2021   Elevated transaminase level 02/18/2021   Type 2 diabetes mellitus with hyperglycemia (HCC) 02/18/2021   Urge incontinence of urine 02/18/2021   Vitamin B12 deficiency (non anemic) 02/18/2021   Vitamin D deficiency 02/18/2021   Pneumonia due to COVID-19 virus 10/25/2019   Hypokalemia 10/25/2019   Hyperbilirubinemia 10/25/2019   Pre-diabetes    History of total hip arthroplasty, right 09/18/2018   Osteoarthritis of right hip 09/14/2018    Morbid obesity, BMI not known (HCC) 09/17/2016   Stasis ulcer (HCC) 09/17/2016   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 [place] [address] Phone: [phone] Fax: [phone]  [facility] - [place], [place] - [address] [place]  [address] Phone: [phone] Fax: [phone]  [facility] [address] [place] [address] Phone: [phone] Fax: [phone]     Social Drivers of Health (SDOH) Social History: SDOH Screenings   Food Insecurity: No Food Insecurity (06/10/2024)  Housing: Low Risk  (06/10/2024)  Transportation Needs: No Transportation Needs (06/10/2024)  Utilities: Not At Risk (06/10/2024)  Social Connections: Moderately Integrated (06/10/2024)  Tobacco Use: Medium Risk (06/10/2024)   SDOH Interventions:     Readmission Risk Interventions     No data to display

## 2024-06-11 NOTE — Evaluation (Signed)
 Occupational Therapy Evaluation Patient Details Name: Kathleen Wiley MRN: [id number] DOB: [birth year] Today's Date: 06/11/2024   History of Present Illness   78 y.o. female presents to [facility] hospital on 06/10/2024 with chills and vomiting. Pt admitted with concern for cellulitis of RLE. PMH includes HTN, HLD, PAF, DMII, myelodysplastic syndrome.     Clinical Impressions Pt ind at baseline with ADLs/functional mobility, pt lives with spouse who works during the day. Pt currently needing up to min A for ADLs, and CGA for transfers with RW. Pt with RLE pain but tolerates standing grooming task at sink. Pt presenting with impairments listed below, will follow acutely. Anticipate no OT follow up needs at d/c.      If plan is discharge home, recommend the following:   A little help with walking and/or transfers;A little help with bathing/dressing/bathroom;Assistance with cooking/housework;Assist for transportation;Help with stairs or ramp for entrance     Functional Status Assessment   Patient has had a recent decline in their functional status and demonstrates the ability to make significant improvements in function in a reasonable and predictable amount of time.     Equipment Recommendations   None recommended by OT (pt has all needed DME)     Recommendations for Other Services   PT consult     Precautions/Restrictions   Precautions Precautions: Fall Restrictions Weight Bearing Restrictions Per Provider Order: No     Mobility Bed Mobility               General bed mobility comments: OOB in chair upon arrival and departure    Transfers Overall transfer level: Needs assistance Equipment used: Rolling walker (2 wheels) Transfers: Sit to/from Stand Sit to Stand: Contact guard assist, Supervision                  Balance Overall balance assessment: Needs assistance Sitting-balance support: No upper extremity supported, Feet supported Sitting balance-Leahy  Scale: Good     Standing balance support: Reliant on assistive device for balance, During functional activity Standing balance-Leahy Scale: Fair Standing balance comment: static standing without AD, RW for ambulation                           ADL either performed or assessed with clinical judgement   ADL Overall ADL's : Needs assistance/impaired Eating/Feeding: Set up;Sitting   Grooming: Set up;Standing;Oral care   Upper Body Bathing: Minimal assistance;Sitting;Standing   Lower Body Bathing: Minimal assistance;Sit to/from stand;Sitting/lateral leans   Upper Body Dressing : Minimal assistance;Standing;Sitting   Lower Body Dressing: Minimal assistance;Sitting/lateral leans;Sit to/from stand   Toilet Transfer: Supervision/safety;Ambulation;Rolling walker (2 wheels)           Functional mobility during ADLs: Supervision/safety       Vision   Vision Assessment?: No apparent visual deficits Additional Comments: L eye with mild lateral deviation     Perception Perception: Not tested       Praxis Praxis: Not tested       Pertinent Vitals/Pain Pain Assessment Pain Assessment: Faces Pain Score: 4  Faces Pain Scale: Hurts little more Pain Location: RLE Pain Descriptors / Indicators: Discomfort Pain Intervention(s): Limited activity within patient's tolerance, Monitored during session, Repositioned     Extremity/Trunk Assessment Upper Extremity Assessment Upper Extremity Assessment: Generalized weakness   Lower Extremity Assessment Lower Extremity Assessment: Defer to PT evaluation   Cervical / Trunk Assessment Cervical / Trunk Assessment: Normal   Communication Communication Communication: No apparent difficulties  Cognition Arousal: Alert Behavior During Therapy: WFL for tasks assessed/performed Cognition: No apparent impairments                               Following commands: Intact       Cueing  General Comments    Cueing Techniques: Verbal cues  BP WNL   Exercises     Shoulder Instructions      Home Living Family/patient expects to be discharged to:: Private residence Living Arrangements: Spouse/significant other Available Help at Discharge: Available PRN/intermittently Type of Home: House Home Access: Stairs to enter [employer] of Steps: 3-4 Entrance Stairs-Rails: Can reach both Home Layout: One level     Bathroom Shower/Tub: Chief Strategy Officer: Standard Bathroom Accessibility: Yes   Home Equipment: Agricultural consultant (2 wheels);Rollator (4 wheels);BSC/3in1;Cane - single point          Prior Functioning/Environment Prior Level of Function : Independent/Modified Independent;Driving             Mobility Comments: cane outside the home, no AD in the home ADLs Comments: ind with ADLs, manages own meds, cooks, drives,    OT Problem List: Decreased strength;Decreased range of motion;Decreased activity tolerance;Impaired balance (sitting and/or standing);Cardiopulmonary status limiting activity   OT Treatment/Interventions: Self-care/ADL training;Therapeutic exercise;Energy conservation;DME and/or AE instruction;Therapeutic activities;Balance training;Patient/family education      OT Goals(Current goals can be found in the care plan section)   Acute Rehab OT Goals Patient Stated Goal: none stated OT Goal Formulation: With patient Time For Goal Achievement: 06/25/24 Potential to Achieve Goals: Good ADL Goals Pt Will Perform Upper Body Dressing: with supervision;sitting Pt Will Perform Lower Body Dressing: with supervision;sitting/lateral leans;sit to/from stand Pt Will Transfer to Toilet: with supervision;ambulating;regular height toilet Pt Will Perform Tub/Shower Transfer: Tub transfer;Shower transfer;with supervision;ambulating;3 in 1;rolling walker   OT Frequency:  Min 2X/week    Co-evaluation              AM-PAC OT 6 Clicks Daily  Activity     Outcome Measure Help from another person eating meals?: None Help from another person taking care of personal grooming?: None Help from another person toileting, which includes using toliet, bedpan, or urinal?: A Little Help from another person bathing (including washing, rinsing, drying)?: A Little Help from another person to put on and taking off regular upper body clothing?: A Little Help from another person to put on and taking off regular lower body clothing?: A Little 6 Click Score: 20   End of Session Equipment Utilized During Treatment: Rolling walker (2 wheels);Gait belt Nurse Communication: Mobility status  Activity Tolerance: Patient tolerated treatment well Patient left: in chair;with call bell/phone within reach  OT Visit Diagnosis: Unsteadiness on feet (R26.81);Other abnormalities of gait and mobility (R26.89);Muscle weakness (generalized) (M62.81)                Time: 8596-8576 OT Time Calculation (min): 20 min Charges:  OT General Charges $OT Visit: 1 Visit OT Evaluation $OT Eval Low Complexity: 1 Low  Kathleen Wiley, OTD, OTR/L [employer] Preferred Acute Rehab [phone]   Kathleen Wiley 06/11/2024, 2:36 PM

## 2024-06-11 NOTE — Hospital Course (Addendum)
 Brief Narrative:   78 y.o. female with medical history significant of hypertension, hyperlipidemia, paroxysmal atrial fibrillation, diabetes mellitus type 2, myelodysplastic syndrome, prior h/o cellulitis presents with chills and vomiting.  In the ER patient noted to have sepsis secondary to right lower extremity cellulitis.  Started on Rocephin .  Lower extremity Dopplers were negative for DVT.  Assessment & Plan:  Principal Problem:   Cellulitis of right lower extremity Active Problems:   Severe sepsis (HCC)   AKI (acute kidney injury) (HCC)   Nausea and vomiting   Essential hypertension, benign   Controlled type 2 diabetes mellitus with neuropathy (HCC)   Hypercholesterolemia   GERD (gastroesophageal reflux disease)   Obesity with body mass index (BMI) of 30.0 to 39.9   Sepsis (HCC)     Severe sepsis secondary to right lower extremity cellulitis without purulent discharge Sepsis physiology slowly improving.  Continue to follow culture data.  Worsening swelling and pain therefore will obtain right lower extremity CT - MRSA swab pending. On Linezolid /Roc.  - Inflammatory markers slightly elevated -Lower extremity Dopplers negative   Acute kidney injury -Baseline creatinine 0.7, admission creatinine 1.1 -Continue IV fluids  Diffuse rash - Likely from red man syndrome.  Resolved after discontinuing vancomycin    Nausea and vomiting Elevated total bilirubin, improving -Continue to monitor.   Essential hypertension -On metoprolol .  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  Will continue to monitor.  Follows with outpatient Dr.  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    DVT prophylaxis: SCDs due to thrombocytopenia    Code Status: Full Code Family Communication:   Status is: Inpatient Continue hospital stay for IV antibiotics    Subjective: Seen at bedside, worsening pain and swelling in the right lower extremity. Increasing erythema.  Examination:  General exam: Appears calm and comfortable  Respiratory system: Clear to auscultation. Respiratory effort normal. Cardiovascular system: S1 & S2 heard, RRR. No JVD, murmurs, rubs, gallops or clicks. No pedal edema. Gastrointestinal system: Abdomen is nondistended, soft and nontender. No organomegaly or masses felt. Normal bowel sounds heard. Central nervous system: Alert and oriented. No focal neurological deficits. Extremities: Symmetric 5 x 5 power. Skin: Right lower extremity erythema Psychiatry: Judgement and insight appear normal. Mood & affect appropriate.

## 2024-06-11 NOTE — Progress Notes (Signed)
 Pt complaining of sore throat and gassy. On call provider made aware see new orders. Pt still having generalized itchiness and rash spreading at the back. Benadryl  IV prn given. Will continue to monitor.

## 2024-06-12 ENCOUNTER — Inpatient Hospital Stay (HOSPITAL_COMMUNITY)

## 2024-06-12 DIAGNOSIS — L03115 Cellulitis of right lower limb: Secondary | ICD-10-CM | POA: Diagnosis not present

## 2024-06-12 LAB — CBC
HCT: 29.1 % — ABNORMAL LOW (ref 36.0–46.0)
Hemoglobin: 9.6 g/dL — ABNORMAL LOW (ref 12.0–15.0)
MCH: 32 pg (ref 26.0–34.0)
MCHC: 33 g/dL (ref 30.0–36.0)
MCV: 97 fL (ref 80.0–100.0)
Platelets: 67 K/uL — ABNORMAL LOW (ref 150–400)
RBC: 3 MIL/uL — ABNORMAL LOW (ref 3.87–5.11)
RDW: 14.5 % (ref 11.5–15.5)
WBC: 1.8 K/uL — ABNORMAL LOW (ref 4.0–10.5)
nRBC: 0 % (ref 0.0–0.2)

## 2024-06-12 LAB — HEMOGLOBIN A1C
Hgb A1c MFr Bld: 5.2 % (ref 4.8–5.6)
Mean Plasma Glucose: 103 mg/dL

## 2024-06-12 LAB — BASIC METABOLIC PANEL WITH GFR
Anion gap: 6 (ref 5–15)
BUN: 12 mg/dL (ref 8–23)
CO2: 21 mmol/L — ABNORMAL LOW (ref 22–32)
Calcium: 7.6 mg/dL — ABNORMAL LOW (ref 8.9–10.3)
Chloride: 110 mmol/L (ref 98–111)
Creatinine, Ser: 0.7 mg/dL (ref 0.44–1.00)
GFR, Estimated: >60 mL/min (ref >60)
Glucose, Bld: 109 mg/dL — ABNORMAL HIGH (ref 70–99)
Potassium: 3.6 mmol/L (ref 3.5–5.1)
Sodium: 137 mmol/L (ref 135–145)

## 2024-06-12 LAB — C-REACTIVE PROTEIN: CRP: 6.9 mg/dL — ABNORMAL HIGH (ref <1.0)

## 2024-06-12 LAB — MAGNESIUM: Magnesium: 1.8 mg/dL (ref 1.7–2.4)

## 2024-06-12 LAB — PHOSPHORUS: Phosphorus: 2.5 mg/dL (ref 2.5–4.6)

## 2024-06-12 LAB — SEDIMENTATION RATE: Sed Rate: 30 mm/h — ABNORMAL HIGH (ref 0–22)

## 2024-06-12 MED ORDER — DIPHENHYDRAMINE HCL 25 MG PO CAPS
25.0000 mg | ORAL_CAPSULE | Freq: Once | ORAL | Status: AC | PRN
Start: 2024-06-12 — End: 2024-06-13
  Administered 2024-06-12: 25 mg via ORAL
  Filled 2024-06-12: qty 1

## 2024-06-12 MED ORDER — IOHEXOL 350 MG/ML SOLN
75.0000 mL | Freq: Once | INTRAVENOUS | Status: AC | PRN
  Administered 2024-06-12: 75 mL via INTRAVENOUS

## 2024-06-12 NOTE — Plan of Care (Signed)

## 2024-06-12 NOTE — Progress Notes (Signed)
 Mobility Specialist Progress Note:    06/12/24 1012  Mobility  Activity Ambulated with assistance (In hallway)  Level of Assistance Contact guard assist, steadying assist  Assistive Device Front wheel walker  Distance Ambulated (ft) 250 ft  Activity Response Tolerated well  Mobility Referral Yes  Mobility visit 1 Mobility  Mobility Specialist Start Time (ACUTE ONLY) 1000  Mobility Specialist Stop Time (ACUTE ONLY) 1010  Mobility Specialist Time Calculation (min) (ACUTE ONLY) 10 min   Received pt in chair and agreeable to mobility. No physical assistance needed. C/o RLE discomfort, otherwise tolerated well. Returned to room without fault. Left in chair. Personal belongings and call light within reach. All needs met.  Lavanda Pollack Mobility Specialist  Please contact via [employer] or  Rehab Office [phone]

## 2024-06-12 NOTE — Progress Notes (Signed)
 PROGRESS NOTE    Kathleen Wiley  FMW:[id number] DOB: February 26, 1946 DOA: 06/10/2024 PCP: Verena Mems, MD    Brief Narrative:   78 y.o. female with medical history significant of hypertension, hyperlipidemia, paroxysmal atrial fibrillation, diabetes mellitus type 2, myelodysplastic syndrome, prior h/o cellulitis presents with chills and vomiting.  In the ER patient noted to have sepsis secondary to right lower extremity cellulitis.  Started on Rocephin .  Lower extremity Dopplers were negative for DVT.  Assessment & Plan:  Principal Problem:   Cellulitis of right lower extremity Active Problems:   Severe sepsis (HCC)   AKI (acute kidney injury) (HCC)   Nausea and vomiting   Essential hypertension, benign   Controlled type 2 diabetes mellitus with neuropathy (HCC)   Hypercholesterolemia   GERD (gastroesophageal reflux disease)   Obesity with body mass index (BMI) of 30.0 to 39.9   Sepsis (HCC)     Severe sepsis secondary to right lower extremity cellulitis without purulent discharge Sepsis physiology slowly improving.  Continue to follow culture data.  Worsening swelling and pain therefore will obtain right lower extremity CT - MRSA swab pending. On Linezolid /Roc.  - Inflammatory markers slightly elevated -Lower extremity Dopplers negative   Acute kidney injury -Baseline creatinine 0.7, admission creatinine 1.1 -Continue IV fluids  Diffuse rash - Likely from red man syndrome.  Resolved after discontinuing vancomycin    Nausea and vomiting Elevated total bilirubin, improving -Continue to monitor.   Essential hypertension -On metoprolol .  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  Will continue to monitor.  Follows with outpatient Dr. 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    DVT prophylaxis: SCDs due to thrombocytopenia    Code Status: Full Code Family Communication:   Status is: Inpatient Continue hospital stay for IV antibiotics    Subjective: Seen at bedside, worsening pain and swelling in the right lower extremity. Increasing erythema.  Examination:  General exam: Appears calm and comfortable  Respiratory system: Clear to auscultation. Respiratory effort normal. Cardiovascular system: S1 & S2 heard, RRR. No JVD, murmurs, rubs, gallops or clicks. No pedal edema. Gastrointestinal system: Abdomen is nondistended, soft and nontender. No organomegaly or masses felt. Normal bowel sounds heard. Central nervous system: Alert and oriented. No focal neurological deficits. Extremities: Symmetric 5 x 5 power. Skin: Right lower extremity erythema Psychiatry: Judgement and insight appear normal. Mood & affect appropriate.                     Diet Orders (From admission, onward)     Start     Ordered   06/10/24 1057  Diet Carb Modified Fluid consistency: Thin; Room service appropriate? Yes  Diet effective now       Question Answer Comment  Diet-HS Snack? Nothing   Calorie Level Medium 1600-2000   Fluid consistency: Thin   Room service appropriate? Yes      06/10/24 1059            Objective: Vitals:   06/11/24 1656 06/11/24 2107 06/12/24 0347 06/12/24 0749  BP: (!) 108/54 (!) 112/50 (!) 104/55 (!) 128/54  Pulse: 77 72 66 64  Resp: 18   17  Temp: 98.7 F (37.1 C) 97.9 F (36.6 C) 97.7 F (36.5 C) 98.2 F (36.8 C)  TempSrc: Oral Oral Oral   SpO2: 97% 97% 98% 98%  Weight:      Height:        Intake/Output Summary (Last 24 hours) at 06/12/2024 1119 Last data filed at 06/12/2024 0900 Gross per 24 hour   Intake 1806.64 ml  Output --  Net 1806.64 ml   Filed Weights   06/10/24 0523  Weight: 99.8 kg    Scheduled Meds:  diclofenac  Sodium  2 g Topical QID   escitalopram   20 mg Oral QHS   gabapentin   300 mg Oral BID   linezolid   600 mg Oral Q12H   metoprolol  succinate  25 mg Oral QHS   pantoprazole   40 mg Oral Daily   rosuvastatin   5 mg Oral QHS   sodium chloride  flush  3 mL Intravenous Q12H   Continuous Infusions:  cefTRIAXone  (ROCEPHIN )  IV Stopped (06/11/24 1158)    Nutritional status     Body mass index is 35.51 kg/m.  Data Reviewed:   CBC: Recent Labs  Lab 06/10/24 0531 06/10/24 1454 06/11/24 0749 06/12/24 0653  WBC 4.8 2.2* 2.8* 1.8*  NEUTROABS  --  1.5* 2.5  --   HGB 12.0 10.6* 10.7* 9.6*  HCT 36.9 31.2* 32.4* 29.1*  MCV 97.6 97.2 96.1 97.0  PLT 100* 66* 67* 67*   Basic Metabolic Panel: Recent Labs  Lab 06/10/24 0531 06/11/24 0749 06/12/24 0653  NA 136 134* 137  K 3.7 3.3* 3.6  CL 102 104 110  CO2 20* 21* 21*  GLUCOSE 160* 189* 109*  BUN 23 19 12   CREATININE 1.11* 0.96 0.70  CALCIUM  8.2* 7.7* 7.6*  MG  --   --  1.8  PHOS  --   --  2.5   GFR: Estimated Creatinine Clearance: 70.2 mL/min (by C-G formula based on SCr of 0.7 mg/dL). Liver Function Tests: Recent Labs  Lab 06/10/24 0531 06/11/24 0749  AST 34 23  ALT 23 18  ALKPHOS 68 56  BILITOT 2.2* 1.4*  PROT 6.2* 5.5*  ALBUMIN  3.1* 2.6*   Recent Labs  Lab 06/10/24 0600  LIPASE 25   No results for input(s): AMMONIA in the last 168 hours. Coagulation Profile: No results for input(s): INR, PROTIME in the last 168 hours. Cardiac Enzymes: No results for input(s): CKTOTAL, CKMB, CKMBINDEX, TROPONINI in the last 168 hours. BNP (last 3 results) No results for input(s): PROBNP in the last 8760 hours. HbA1C: Recent Labs    06/10/24 1454  HGBA1C 5.2   CBG: Recent Labs  Lab 06/10/24 0537  GLUCAP 173*   Lipid Profile: No results for input(s): CHOL, HDL,  LDLCALC, TRIG, CHOLHDL, LDLDIRECT in the last 72 hours. Thyroid  Function Tests: No results for input(s): TSH, T4TOTAL, FREET4, T3FREE, THYROIDAB in the last 72 hours. Anemia Panel: No results for input(s): VITAMINB12, FOLATE, FERRITIN, TIBC, IRON, RETICCTPCT in the last 72 hours. Sepsis Labs: Recent Labs  Lab 06/10/24 9095  LATICACIDVEN 1.3    Recent Results (from the past 240 hours)  Resp panel by RT-PCR (RSV, Flu A&B, Covid) Anterior Nasal Swab     Status: None   Collection Time: 06/10/24  6:00 AM   Specimen: Anterior Nasal Swab  Result Value Ref Range Status   SARS Coronavirus 2 by RT PCR NEGATIVE NEGATIVE Final   Influenza A by PCR NEGATIVE NEGATIVE  Final   Influenza B by PCR NEGATIVE NEGATIVE Final    Comment: (NOTE) The Xpert Xpress SARS-CoV-2/FLU/RSV plus assay is intended as an aid in the diagnosis of influenza from Nasopharyngeal swab specimens and should not be used as a sole basis for treatment. Nasal washings and aspirates are unacceptable for Xpert Xpress SARS-CoV-2/FLU/RSV testing.  Fact Sheet for Patients: BloggerCourse.com  Fact Sheet for Healthcare Providers: SeriousBroker.it  This test is not yet approved or cleared by the [place]  FDA and has been authorized for detection and/or diagnosis of SARS-CoV-2 by FDA under an Emergency Use Authorization (EUA). This EUA will remain in effect (meaning this test can be used) for the duration of the COVID-19 declaration under Section 564(b)(1) of the Act, 21 U.S.C. section 360bbb-3(b)(1), unless the authorization is terminated or revoked.     Resp Syncytial Virus by PCR NEGATIVE NEGATIVE Final    Comment: (NOTE) Fact Sheet for Patients: BloggerCourse.com  Fact Sheet for Healthcare Providers: SeriousBroker.it  This test is not yet approved or cleared by the [place]  FDA  and has been authorized for detection and/or diagnosis of SARS-CoV-2 by FDA under an Emergency Use Authorization (EUA). This EUA will remain in effect (meaning this test can be used) for the duration of the COVID-19 declaration under Section 564(b)(1) of the Act, 21 U.S.C. section 360bbb-3(b)(1), unless the authorization is terminated or revoked.  Performed at [facility], [address] [place], [place] [address]   Blood culture (routine x 2)     Status: None (Preliminary result)   Collection Time: 06/10/24  6:47 AM   Specimen: BLOOD LEFT FOREARM  Result Value Ref Range Status   Specimen Description BLOOD LEFT FOREARM  Final   Special Requests   Final    BOTTLES DRAWN AEROBIC AND ANAEROBIC Blood Culture results may not be optimal due to an inadequate volume of blood received in culture bottles   Culture   Final    NO GROWTH 2 DAYS Performed at [facility], [address] [place], [place] [address]    Report Status PENDING  Incomplete  Blood culture (routine x 2)     Status: None (Preliminary result)   Collection Time: 06/10/24  6:52 AM   Specimen: BLOOD RIGHT ARM  Result Value Ref Range Status   Specimen Description BLOOD RIGHT ARM  Final   Special Requests   Final    BOTTLES DRAWN AEROBIC ONLY Blood Culture results may not be optimal due to an inadequate volume of blood received in culture bottles   Culture   Final    NO GROWTH 2 DAYS Performed at [facility], [address], [place], [place] [address]    Report Status PENDING  Incomplete         Radiology Studies: No results found.         LOS: 2 days   Time spent= 35 mins    Burgess JAYSON Dare, MD [facility]  If 7PM-7AM, please contact night-coverage  06/12/2024, 11:19 AM

## 2024-06-12 NOTE — Progress Notes (Signed)
 Patient IV site is very painful when flushing. Patient prefers not to have to re insert IV and prefers oral medication.

## 2024-06-12 NOTE — TOC Initial Note (Addendum)
 Transition of Care (TOC) - Initial/Assessment Note   Spoke to patient at bedside. Patient from home with husband. She has walker and 3 in 1 at home already.   Orders for home health PT. NCM provided medicare.gov list of agencies. Patient has had [facility] in the past and would like them again.   NCM left Leita with [facility] a message awaiting call back. Received a call back. [facility] cannot accept referral.   Patient aware , no preference.   Burnard with [facility] reviewing referral. Burnard with [facility] has accepted referral for HHPT  Patient Details  Name: Kathleen Wiley MRN: [id number] Date of Birth: [birth year]  [facility] of [facility]) CM/SW Contact:    Stephane Powell Jansky, RN Phone Number: 06/12/2024, 10:13 AM  Clinical Narrative:                   Expected Discharge Plan: Home w Home Health Services Barriers to Discharge: Continued Medical Work up   Patient Goals and CMS Choice Patient states their goals for this hospitalization and ongoing recovery are:: to return to home CMS Medicare.gov Compare Post Acute Care list provided to:: Patient Choice offered to / list presented to : Patient      Expected Discharge Plan and Services   Discharge Planning Services: CM Consult Post Acute Care Choice: Home Health Living arrangements for the past 2 months: Single Family Home                 DME Arranged: N/A DME Agency: NA       HH Arranged: PT HH Agency: [facility]) Date HH Agency Contacted: 06/12/24 Time HH Agency Contacted: 1012 Representative spoke with at [facility] Agency: left Leita a message , awaiting call back  Prior Living Arrangements/Services Living arrangements for the past 2 months: Single Family Home Lives with:: Spouse Patient language and need for interpreter reviewed:: Yes Do you feel safe going back to the place where you live?: Yes      Need for Family Participation in Patient Care: Yes (Comment) Care giver support system in  place?: Yes (comment) Current home services: DME Criminal Activity/Legal Involvement Pertinent to Current Situation/Hospitalization: No - Comment as needed  Activities of Daily Living   ADL Screening (condition at time of admission) Independently performs ADLs?: Yes (appropriate for developmental age) Is the patient deaf or have difficulty hearing?: No Does the patient have difficulty seeing, even when wearing glasses/contacts?: Yes Does the patient have difficulty concentrating, remembering, or making decisions?: No  Permission Sought/Granted   Permission granted to share information with : Yes, Verbal Permission Granted     Permission granted to share info w AGENCY: [facility]        Emotional Assessment Appearance:: Appears stated age Attitude/Demeanor/Rapport: Engaged Affect (typically observed): Appropriate Orientation: : Oriented to Self, Oriented to Place, Oriented to  Time, Oriented to Situation Alcohol / Substance Use: Not Applicable Psych Involvement: No (comment)  Admission diagnosis:  Cellulitis of leg [L03.119] Generalized weakness [R53.1] Cellulitis, unspecified cellulitis site [L03.90] Localized swelling of right lower extremity [R22.41] Sepsis (HCC) [A41.9] Patient Active Problem List   Diagnosis Date Noted   Cellulitis of leg 06/10/2024   Severe sepsis (HCC) 06/10/2024   AKI (acute kidney injury) (HCC) 06/10/2024   Nausea and vomiting 06/10/2024   MDS (myelodysplastic syndrome), low grade (HCC) 07/13/2022   Prolonged QT interval 05/04/2022   Elevated TSH 05/04/2022   Abnormal ECG 05/04/2022   Cellulitis 05/03/2022   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) 05/03/2022   Sinus bradycardia 05/03/2022   Group G streptococcal infection 04/21/2022   Sepsis (HCC) 04/20/2022   Cellulitis of right lower extremity 04/20/2022   Gallstone pancreatitis 03/15/2021   Acute cholecystitis 03/15/2021   Controlled type 2 diabetes mellitus with neuropathy  (HCC) 03/15/2021   Obesity with body mass index (BMI) of 30.0 to 39.9 03/15/2021   Acute gallstone pancreatitis 03/15/2021   Abnormal cervical Papanicolaou smear 02/18/2021   Abnormal liver function tests 02/18/2021   Acquired hallux valgus 02/18/2021   Acute hypoxemic respiratory failure (HCC) 02/18/2021   Aneurysm of thoracic aorta (HCC) 02/18/2021   Anxiety 02/18/2021   Aortic root dilatation (HCC) 02/18/2021   Aortic valve disorder 02/18/2021   Arthropathy 02/18/2021   Callosity 02/18/2021   Cardiomegaly 02/18/2021   Chest discomfort 02/18/2021   Chronic kidney disease (CKD) stage G2/A1, mildly decreased glomerular filtration rate (GFR) between 60-89 mL/min/1.73 square meter and albuminuria creatinine ratio less than 30 mg/g 02/18/2021   Chronic kidney disease, stage 2 (mild) 02/18/2021   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   COVID-19 virus infection 02/18/2021   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/18/2021   Edema 02/18/2021   Edema of both lower legs 02/18/2021   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   Leukopenia 02/18/2021   Low back pain 02/18/2021   Malabsorption syndrome 02/18/2021   Metabolic syndrome 02/18/2021   Mild cervical dysplasia 02/18/2021   Mixed anxiety and depressive disorder 02/18/2021   Neuropathy 02/18/2021   Obstructive sleep apnea syndrome 02/18/2021   Senile purpura (HCC) 02/18/2021   Snoring 02/18/2021   Elevated transaminase level 02/18/2021   Type 2 diabetes mellitus with hyperglycemia (HCC) 02/18/2021   Urge incontinence of urine 02/18/2021   Vitamin B12 deficiency (non anemic) 02/18/2021   Vitamin D deficiency 02/18/2021   Pneumonia due to COVID-19 virus 10/25/2019   Hypokalemia 10/25/2019   Hyperbilirubinemia 10/25/2019   Pre-diabetes    History of total hip arthroplasty, right 09/18/2018   Osteoarthritis of right hip 09/14/2018   Morbid obesity, BMI not known (HCC) 09/17/2016   Stasis ulcer (HCC) 09/17/2016   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) 08/29/2016   Cellulitis of left lower leg 08/10/2016   Infection of flexor tendon sheath 06/14/2016   Pain of right thumb 06/14/2016   GERD (gastroesophageal reflux disease) 02/09/2013   Essential hypertension, benign 02/09/2013   Hypercholesterolemia 02/09/2013   Constipation 02/09/2013   Muscle spasm 02/09/2013   Osteoarthritis of right knee 01/26/2013   Osteoarthritis of left hip 05/10/2012   PCP:  Verena Mems, MD Pharmacy:   [facility] [address] [place] - [address] [place] [address] Phone: [phone] Fax: [phone]  [facility] - [place], [place] - [address] [place] [address] Phone: [phone] Fax: [phone]  [facility] [address] [place] [address] Phone: [phone] Fax: [phone]     Social Drivers of Health (SDOH) Social History: SDOH Screenings   Food Insecurity: No Food Insecurity (06/10/2024)  Housing: Low Risk  (06/10/2024)  Transportation Needs: No Transportation Needs (06/10/2024)  Utilities: Not At Risk (06/10/2024)  Social Connections: Moderately Integrated (06/10/2024)  Tobacco Use: Medium Risk (06/10/2024)   SDOH Interventions:     Readmission Risk Interventions     No data to display

## 2024-06-12 NOTE — Plan of Care (Signed)
   Problem: Education: Goal: Knowledge of General Education information will improve Description Including pain rating scale, medication(s)/side effects and non-pharmacologic comfort measures Outcome: Progressing

## 2024-06-13 DIAGNOSIS — L03116 Cellulitis of left lower limb: Secondary | ICD-10-CM

## 2024-06-13 DIAGNOSIS — L03115 Cellulitis of right lower limb: Secondary | ICD-10-CM | POA: Diagnosis not present

## 2024-06-13 DIAGNOSIS — D469 Myelodysplastic syndrome, unspecified: Secondary | ICD-10-CM

## 2024-06-13 LAB — BASIC METABOLIC PANEL WITH GFR
Anion gap: 7 (ref 5–15)
BUN: 10 mg/dL (ref 8–23)
CO2: 22 mmol/L (ref 22–32)
Calcium: 8 mg/dL — ABNORMAL LOW (ref 8.9–10.3)
Chloride: 108 mmol/L (ref 98–111)
Creatinine, Ser: 0.71 mg/dL (ref 0.44–1.00)
GFR, Estimated: >60 mL/min (ref >60)
Glucose, Bld: 97 mg/dL (ref 70–99)
Potassium: 3.6 mmol/L (ref 3.5–5.1)
Sodium: 137 mmol/L (ref 135–145)

## 2024-06-13 LAB — CBC
HCT: 28.6 % — ABNORMAL LOW (ref 36.0–46.0)
Hemoglobin: 9.5 g/dL — ABNORMAL LOW (ref 12.0–15.0)
MCH: 31.9 pg (ref 26.0–34.0)
MCHC: 33.2 g/dL (ref 30.0–36.0)
MCV: 96 fL (ref 80.0–100.0)
Platelets: 83 K/uL — ABNORMAL LOW (ref 150–400)
RBC: 2.98 MIL/uL — ABNORMAL LOW (ref 3.87–5.11)
RDW: 14.5 % (ref 11.5–15.5)
WBC: 2.2 K/uL — ABNORMAL LOW (ref 4.0–10.5)
nRBC: 0 % (ref 0.0–0.2)

## 2024-06-13 LAB — MAGNESIUM: Magnesium: 1.8 mg/dL (ref 1.7–2.4)

## 2024-06-13 MED ORDER — CEFAZOLIN SODIUM-DEXTROSE 2-4 GM/100ML-% IV SOLN
2.0000 g | Freq: Three times a day (TID) | INTRAVENOUS | Status: DC
  Administered 2024-06-13 – 2024-06-14 (×3): 2 g via INTRAVENOUS
  Filled 2024-06-13 (×3): qty 100

## 2024-06-13 NOTE — Plan of Care (Signed)

## 2024-06-13 NOTE — Progress Notes (Signed)
 PROGRESS NOTE    Kathleen Wiley  FMW:[id number] DOB: 26-Aug-1946 DOA: 06/10/2024 PCP: Verena Mems, MD    Brief Narrative:   78 y.o. female with medical history significant of hypertension, hyperlipidemia, paroxysmal atrial fibrillation, diabetes mellitus type 2, myelodysplastic syndrome, prior h/o cellulitis presents with chills and vomiting.  In the ER patient noted to have sepsis secondary to right lower extremity cellulitis.  Started on Rocephin .  Lower extremity Dopplers were negative for DVT.  Assessment & Plan:  Principal Problem:   Cellulitis of right lower extremity Active Problems:   Severe sepsis (HCC)   AKI (acute kidney injury) (HCC)   Nausea and vomiting   Essential hypertension, benign   Controlled type 2 diabetes mellitus with neuropathy (HCC)   Hypercholesterolemia   GERD (gastroesophageal reflux disease)   Obesity with body mass index (BMI) of 30.0 to 39.9   Sepsis (HCC)     Severe sepsis secondary to right lower extremity cellulitis without purulent discharge Right lower extremity cellulitis along with mild left lower extremity erythema still persist along with some swelling of right lower extremity.  CT is negative for any acute deep infection or abscess.  MRSA swab still pending.  On linezolid /Rocephin . - Lower extremity Dopplers is negative - Will get ID thoughts on this possible cellulitis.  Possibility of Derm related versus autoimmune?   Acute kidney injury -Baseline creatinine 0.7, admission creatinine 1.1 -Continue IV fluids  Diffuse rash - Likely from red man syndrome.  Resolved after discontinuing vancomycin    Nausea and vomiting Elevated total bilirubin, improving -Continue to monitor.   Essential hypertension -On metoprolol .  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  Will continue to monitor.  Follows with outpatient Dr. 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    DVT prophylaxis: SCDs due to thrombocytopenia    Code Status: Full Code Family Communication:   Status is: Inpatient Continue hospital stay for IV antibiotics    Subjective: Seen at bedside.  Tells me her right lower extremity swelling is slightly better today and less painful.  Her chest/back/hip rash has essentially resolved.   Examination:  General exam: Appears calm and comfortable  Respiratory system: Clear to auscultation. Respiratory effort normal. Cardiovascular system: S1 & S2 heard, RRR. No JVD, murmurs, rubs, gallops or clicks. No pedal edema. Gastrointestinal system: Abdomen is nondistended, soft and nontender. No organomegaly or masses felt. Normal bowel sounds heard. Central nervous system: Alert and oriented. No focal neurological deficits. Extremities: Symmetric 5 x 5 power. Skin: Right lower extremity erythema and left lower extremity demarcated Psychiatry: Judgement and insight appear normal. Mood & affect appropriate.                Diet Orders (From admission, onward)     Start     Ordered   06/10/24 1057  Diet Carb Modified Fluid consistency: Thin; Room service appropriate? Yes  Diet effective now       Question Answer Comment  Diet-HS Snack? Nothing   Calorie Level Medium 1600-2000   Fluid consistency: Thin   Room service appropriate? Yes  06/10/24 1059            Objective: Vitals:   06/12/24 1811 06/12/24 2113 06/13/24 0434 06/13/24 0733  BP: (!) 112/55 (!) 119/52 120/60 120/61  Pulse: 75 71 67 67  Resp: 17 18 20 18   Temp:  98.3 F (36.8 C) 97.9 F (36.6 C) 97.7 F (36.5  C)  TempSrc:  Oral Oral   SpO2: 98% 97% 93% 96%  Weight:      Height:        Intake/Output Summary (Last 24 hours) at 06/13/2024 1057 Last data filed at 06/12/2024 1300 Gross per 24 hour  Intake 360 ml  Output --  Net 360 ml   Filed Weights   06/10/24 0523  Weight: 99.8 kg    Scheduled Meds:  escitalopram   20 mg Oral QHS   gabapentin   300 mg Oral BID   linezolid   600 mg Oral Q12H   metoprolol  succinate  25 mg Oral QHS   pantoprazole   40 mg Oral Daily   rosuvastatin   5 mg Oral QHS   sodium chloride  flush  3 mL Intravenous Q12H   Continuous Infusions:  cefTRIAXone  (ROCEPHIN )  IV 2 g (06/12/24 1228)    Nutritional status     Body mass index is 35.51 kg/m.  Data Reviewed:   CBC: Recent Labs  Lab 06/10/24 0531 06/10/24 1454 06/11/24 0749 06/12/24 0653 06/13/24 0722  WBC 4.8 2.2* 2.8* 1.8* 2.2*  NEUTROABS  --  1.5* 2.5  --   --   HGB 12.0 10.6* 10.7* 9.6* 9.5*  HCT 36.9 31.2* 32.4* 29.1* 28.6*  MCV 97.6 97.2 96.1 97.0 96.0  PLT 100* 66* 67* 67* 83*   Basic Metabolic Panel: Recent Labs  Lab 06/10/24 0531 06/11/24 0749 06/12/24 0653 06/13/24 0722  NA 136 134* 137 137  K 3.7 3.3* 3.6 3.6  CL 102 104 110 108  CO2 20* 21* 21* 22  GLUCOSE 160* 189* 109* 97  BUN 23 19 12 10   CREATININE 1.11* 0.96 0.70 0.71  CALCIUM  8.2* 7.7* 7.6* 8.0*  MG  --   --  1.8 1.8  PHOS  --   --  2.5  --    GFR: Estimated Creatinine Clearance: 70.2 mL/min (by C-G formula based on SCr of 0.71 mg/dL). Liver Function Tests: Recent Labs  Lab 06/10/24 0531 06/11/24 0749  AST 34 23  ALT 23 18  ALKPHOS 68 56  BILITOT 2.2* 1.4*  PROT 6.2* 5.5*  ALBUMIN  3.1* 2.6*   Recent Labs  Lab 06/10/24 0600  LIPASE 25   No results for input(s): AMMONIA in the last 168 hours. Coagulation Profile: No results for input(s): INR, PROTIME in the last 168 hours. Cardiac Enzymes: No results for input(s): CKTOTAL, CKMB, CKMBINDEX, TROPONINI in the last 168 hours. BNP (last 3  results) No results for input(s): PROBNP in the last 8760 hours. HbA1C: Recent Labs    06/10/24 1454  HGBA1C 5.2   CBG: Recent Labs  Lab 06/10/24 0537  GLUCAP 173*   Lipid Profile: No results for input(s): CHOL, HDL, LDLCALC, TRIG, CHOLHDL, LDLDIRECT in the last 72 hours. Thyroid  Function Tests: No results for input(s): TSH, T4TOTAL, FREET4, T3FREE, THYROIDAB in the last 72 hours. Anemia Panel: No results for input(s): VITAMINB12, FOLATE, FERRITIN, TIBC, IRON, RETICCTPCT in the last 72 hours. Sepsis Labs: Recent Labs  Lab 06/10/24 0904  LATICACIDVEN 1.3    Recent Results (from the past 240 hours)  Resp panel by RT-PCR (RSV, Flu A&B, Covid) Anterior  Nasal Swab     Status: None   Collection Time: 06/10/24  6:00 AM   Specimen: Anterior Nasal Swab  Result Value Ref Range Status   SARS Coronavirus 2 by RT PCR NEGATIVE NEGATIVE Final   Influenza A by PCR NEGATIVE NEGATIVE Final   Influenza B by PCR NEGATIVE NEGATIVE Final    Comment: (NOTE) The Xpert Xpress SARS-CoV-2/FLU/RSV plus assay is intended as an aid in the diagnosis of influenza from Nasopharyngeal swab specimens and should not be used as a sole basis for treatment. Nasal washings and aspirates are unacceptable for Xpert Xpress SARS-CoV-2/FLU/RSV testing.  Fact Sheet for Patients: BloggerCourse.com  Fact Sheet for Healthcare Providers: SeriousBroker.it  This test is not yet approved or cleared by the [place]  FDA and has been authorized for detection and/or diagnosis of SARS-CoV-2 by FDA under an Emergency Use Authorization (EUA). This EUA will remain in effect (meaning this test can be used) for the duration of the COVID-19 declaration under Section 564(b)(1) of the Act, 21 U.S.C. section 360bbb-3(b)(1), unless the authorization is terminated or revoked.     Resp Syncytial Virus by PCR NEGATIVE NEGATIVE Final     Comment: (NOTE) Fact Sheet for Patients: BloggerCourse.com  Fact Sheet for Healthcare Providers: SeriousBroker.it  This test is not yet approved or cleared by the [place]  FDA and has been authorized for detection and/or diagnosis of SARS-CoV-2 by FDA under an Emergency Use Authorization (EUA). This EUA will remain in effect (meaning this test can be used) for the duration of the COVID-19 declaration under Section 564(b)(1) of the Act, 21 U.S.C. section 360bbb-3(b)(1), unless the authorization is terminated or revoked.  Performed at [facility], [address], [place], [place] [address]   Blood culture (routine x 2)     Status: None (Preliminary result)   Collection Time: 06/10/24  6:47 AM   Specimen: BLOOD LEFT FOREARM  Result Value Ref Range Status   Specimen Description BLOOD LEFT FOREARM  Final   Special Requests   Final    BOTTLES DRAWN AEROBIC AND ANAEROBIC Blood Culture results may not be optimal due to an inadequate volume of blood received in culture bottles   Culture   Final    NO GROWTH 3 DAYS Performed at [facility], [address], [place], [place] [address]    Report Status PENDING  Incomplete  Blood culture (routine x 2)     Status: None (Preliminary result)   Collection Time: 06/10/24  6:52 AM   Specimen: BLOOD RIGHT ARM  Result Value Ref Range Status   Specimen Description BLOOD RIGHT ARM  Final   Special Requests   Final    BOTTLES DRAWN AEROBIC ONLY Blood Culture results may not be optimal due to an inadequate volume of blood received in culture bottles   Culture   Final    NO GROWTH 3 DAYS Performed at [facility], [address] [place], [place] [address]    Report Status PENDING  Incomplete         Radiology Studies: CT TIBIA FIBULA RIGHT W CONTRAST Result Date: 06/12/2024 CLINICAL DATA:  Soft tissue infection suspected, lower leg, xray done EXAM: CT OF THE LOWER RIGHT  EXTREMITY WITH CONTRAST TECHNIQUE: Multidetector CT imaging of the lower right extremity was performed according to the standard protocol following intravenous contrast administration. RADIATION DOSE REDUCTION: This exam was performed according to the departmental dose-optimization program which includes automated exposure control, adjustment of the mA and/or  kV according to patient size and/or use of iterative reconstruction technique. CONTRAST:  75mL OMNIPAQUE  IOHEXOL  350 MG/ML SOLN COMPARISON:  Right tibia and fibula radiographs dated 05/03/2022. FINDINGS: Bones/Joint/Cartilage No acute fracture or dislocation. Status post right total knee arthroplasty. Hardware appears well seated with appropriate alignment. Postoperative changes of the distal first metatarsal and first proximal phalanx related to prior bunionectomy. Remote osteotomy of the second metatarsal head. 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 Mild degenerative arthropathy of the forefoot. Ligaments Ligaments are suboptimally evaluated by CT. Muscles and Tendons Atrophy of the intrinsic foot musculature may reflect chronic denervation changes. Muscles are otherwise within normal limits. No intramuscular fluid collection. Visualized tendons are intact. Soft tissue Generalized subcutaneous edema extending from the proximal calf through the dorsal foot with overlying cutaneous thickening. No loculated fluid collection. No soft tissue gas. No soft tissue mass. IMPRESSION: 1. Generalized subcutaneous edema extending from the proximal calf through the dorsal foot with overlying cutaneous thickening, which could reflect cellulitis in the appropriate clinical setting. No loculated fluid collection. No soft tissue gas. 2. No acute osseous abnormality. 3. Intact right total knee arthroplasty. 4. Postoperative changes related to prior bunionectomy and osteotomy of the second metatarsal head. 5.  Mild-to-moderate degenerative arthropathy throughout the foot. Electronically Signed   By: Harrietta Sherry M.D.   On: 06/12/2024 12:25           LOS: 3 days   Time spent= 35 mins    Burgess JAYSON Dare, MD [facility]  If 7PM-7AM, please contact night-coverage  06/13/2024, 10:57 AM

## 2024-06-13 NOTE — Progress Notes (Signed)
 Physical Therapy Treatment Patient Details Name: Kathleen Wiley MRN: [id number] DOB: 1946-08-04 Today's Date: 06/13/2024   History of Present Illness 78 y.o. female presents to [facility] hospital on 06/10/2024 with chills and vomiting. Pt admitted with concern for cellulitis of RLE. PMH includes HTN, HLD, PAF, DMII, myelodysplastic syndrome.    PT Comments  Pt seen for PT tx with pt agreeable. Pt attempted gait without AD, ambulated with supervision with increased lateral sway; then utilized RW with supervision & increased balance. Verbally educated pt on compensatory pattern for safe stair negotiation. Pt reporting feeling unwell but unable to describe so assisted back to bed, VSS, pt reporting feeling better lying down. Will continue to follow to progress mobility as able.    If plan is discharge home, recommend the following: A little help with walking and/or transfers;A little help with bathing/dressing/bathroom;Assistance with cooking/housework;Assist for transportation;Help with stairs or ramp for entrance   Can travel by private vehicle        Equipment Recommendations  None recommended by PT    Recommendations for Other Services       Precautions / Restrictions Precautions Precautions: Fall Restrictions Weight Bearing Restrictions Per Provider Order: No     Mobility  Bed Mobility Overal bed mobility: Modified Independent Bed Mobility: Supine to Sit, Sit to Supine     Supine to sit: Modified independent (Device/Increase time), HOB elevated, Used rails Sit to supine: Modified independent (Device/Increase time), HOB elevated        Transfers Overall transfer level: Needs assistance Equipment used: None Transfers: Sit to/from Stand Sit to Stand: Supervision                Ambulation/Gait Ambulation/Gait assistance: Supervision Gait Distance (Feet): 10 Feet (+ 200 ft) Assistive device: None, Rollator (4 wheels) Gait Pattern/deviations: Decreased step length - right,  Decreased stride length, Decreased step length - left Gait velocity: decreased     General Gait Details: Pt ambulates without AD around bed with supervision with increased lateral sway. Pt reports lightheadedness & takes standing break, reports symptoms go away & continues walking with walker with supervision with increased balance.   Stairs Stairs:  (did not attempt but verbally educated pt on compensatory pattern)           Wheelchair Mobility     Tilt Bed    Modified Rankin (Stroke Patients Only)       Balance Overall balance assessment: Needs assistance Sitting-balance support: No upper extremity supported, Feet supported Sitting balance-Leahy Scale: Good     Standing balance support: No upper extremity supported, During functional activity Standing balance-Leahy Scale: Fair                              Hotel manager: No apparent difficulties  Cognition Arousal: Alert Behavior During Therapy: WFL for tasks assessed/performed   PT - Cognitive impairments: No apparent impairments                         Following commands: Intact      Cueing Cueing Techniques: Verbal cues  Exercises      General Comments General comments (skin integrity, edema, etc.): semi fowler in bed at end of session: SpO2 96% on room air, HR 68 bpm, BP 130/60 mmHg MAP 80      Pertinent Vitals/Pain Pain Assessment Pain Assessment: Faces Faces Pain Scale: Hurts a little bit Pain Location: BLE Pain Descriptors /  Indicators: Tightness Pain Intervention(s): Monitored during session    Home Living                          Prior Function            PT Goals (current goals can now be found in the care plan section) Acute Rehab PT Goals Patient Stated Goal: to return to independence, improve activity tolerance PT Goal Formulation: With patient Time For Goal Achievement: 06/25/24 Potential to Achieve Goals:  Good Progress towards PT goals: Progressing toward goals    Frequency    Min 2X/week      PT Plan      Co-evaluation              AM-PAC PT 6 Clicks Mobility   Outcome Measure  Help needed turning from your back to your side while in a flat bed without using bedrails?: None Help needed moving from lying on your back to sitting on the side of a flat bed without using bedrails?: None Help needed moving to and from a bed to a chair (including a wheelchair)?: A Little Help needed standing up from a chair using your arms (e.g., wheelchair or bedside chair)?: A Little Help needed to walk in hospital room?: A Little Help needed climbing 3-5 steps with a railing? : A Little 6 Click Score: 20    End of Session   Activity Tolerance:  (limited 2/2 feeling unwell) Patient left: in bed;with call bell/phone within reach;with bed alarm set   PT Visit Diagnosis: Other abnormalities of gait and mobility (R26.89);Muscle weakness (generalized) (M62.81);Unsteadiness on feet (R26.81)     Time: 8850-8796 PT Time Calculation (min) (ACUTE ONLY): 14 min  Charges:    $Therapeutic Activity: 8-22 mins PT General Charges $$ ACUTE PT VISIT: 1 Visit                     Kathleen Wiley, PT, DPT 06/13/24, 12:21 PM   Kathleen Wiley 06/13/2024, 12:19 PM

## 2024-06-13 NOTE — Consult Note (Signed)
 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 Diabetes mellitus Obesity   Plan:  Narrowed to cefazolin  Elevate legs above heart Standard universal precautions I would like her to have a prescription of antibiotics with a bottle on hand with her person of cefadroxil  2 tablets twice daily that she can take reactively to future episodes of cellulitis and I will schedule her follow-up appointment in my clinic  Principal Problem:   Cellulitis of right lower extremity Active Problems:   GERD (gastroesophageal reflux disease)   Essential hypertension, benign   Hypercholesterolemia   Controlled type 2 diabetes mellitus with neuropathy (HCC)   Obesity with body mass index (BMI) of 30.0 to 39.9   Sepsis (HCC)   Severe sepsis (HCC)   AKI (acute kidney injury) (HCC)   Nausea and vomiting   Scheduled Meds:  escitalopram   20 mg Oral QHS   gabapentin   300 mg Oral BID   metoprolol  succinate  25 mg Oral QHS   pantoprazole   40 mg Oral Daily   rosuvastatin   5 mg Oral QHS   sodium chloride  flush  3 mL Intravenous Q12H   Continuous Infusions:   ceFAZolin  (ANCEF ) IV     PRN Meds:.acetaminophen  **OR** acetaminophen , diphenhydrAMINE , glucagon  (human recombinant), hydrALAZINE , ipratropium-albuterol , metoprolol  tartrate, ondansetron  (ZOFRAN ) IV, phenol, senna-docusate  HPI: Kathleen Wiley is a 78 y.o. female who has past medical history significant for paroxysmal atrial fibrillation diabetes mellitus myelodysplastic syndrome and multiple episodes of cellulitis who developed acute onset of fevers and chills this past Saturday with slight blotchy erythema in the right lower extremity.  The latter progressed and she ultimately came to the ER and was  admitted.  Quite febrile to over 103 degrees.  She had blood cultures taken which have been negative.  She was started initially on vancomycin  and ceftriaxone  but then had an apparent red man syndrome to vancomycin  and it was abandoned in favor of Zyvox .  The red man syndrome manifested as blotchy erythema in her upper extremities and has subsequently resolved in the interim she has developed erythema in her left lower extremity also concerning for cellulitis of note prior to the onset of her cellulitis she had been to the podiatrist who had worked in one of her toes but more importantly she had been to a pedicurist and had her feet in the water of the pedicure bath.  I expect the pedicure bath may have been the source of inoculation of bacteria into both feet and that it took longer for the erythema to emerge in the left side as it has on the right.  Despite the pedicure bath being a potential source of other bacteria besides the usual culprits for cellulitis such as streptococcal species I do not think that we need to really be going for gram-negative coverage at this point she had a little bit of this with the ceftriaxone  that she received but I think we can narrow to cefazolin .  Does need to keep her legs elevated above her heart to improve the appearance of her legs.  I will check on her again tomorrow.  I want her to have antibiotics on hand in the future so that she can react to these  episodes cellulitis with prompt initiation of oral antibiotics that she would have on her person.  I will schedule a follow-up with her in my clinic because of I am going to be prescribing chronic reactive antibiotics to her she needs to have regular follow-up with me at least once a year.    I have personally spent 81 minutes involved in face-to-face and non-face-to-face activities for this patient on the day of the visit. Professional time spent includes the following activities: Preparing to see the patient  (review of tests), Obtaining and/or reviewing separately obtained history (admission/discharge record), Performing a medically appropriate examination and/or evaluation , Ordering medications/tests/procedures, referring and communicating with other health care professionals, Documenting clinical information in the EMR, Independently interpreting results (not separately reported), Communicating results to the patient/family/caregiver, Counseling and educating the patient/family/caregiver and Care coordination (not separately reported).   Evaluation of the patient requires complex antimicrobial therapy evaluation, counseling , isolation needs to reduce disease transmission and risk assessment and mitigation.     Review of Systems: Review of Systems  Constitutional:  Positive for fever. Negative for chills, malaise/fatigue and weight loss.  HENT:  Negative for congestion and sore throat.   Eyes:  Negative for blurred vision and photophobia.  Respiratory:  Negative for cough, shortness of breath and wheezing.   Cardiovascular:  Negative for chest pain, palpitations and leg swelling.  Gastrointestinal:  Negative for abdominal pain, blood in stool, constipation, diarrhea, heartburn, melena, nausea and vomiting.  Genitourinary:  Negative for dysuria, flank pain and hematuria.  Musculoskeletal:  Negative for back pain, falls, joint pain and myalgias.  Skin:  Negative for itching and rash.  Neurological:  Positive for weakness. Negative for dizziness, focal weakness, loss of consciousness and headaches.  Endo/Heme/Allergies:  Does not bruise/bleed easily.  Psychiatric/Behavioral:  Negative for depression and suicidal ideas. The patient does not have insomnia.     Past Medical History:  Diagnosis Date   Anxiety    Arthritis    knee, hip   Cellulitis 08/2016   left leg   Complication of anesthesia    Diabetes mellitus without complication (HCC)    GERD (gastroesophageal reflux disease)    Heart  murmur    slight   informed years ago.-    Hernia, umbilical    History of atrial fibrillation 2007   with flagyl   History of endometriosis    Hypercholesteremia    Hypertension    has not been to a cardiologist   PONV (postoperative nausea and vomiting)    Rectal vaginal fistula    Refusal of blood transfusions as patient is Jehovah's Witness    Sleep apnea     Social History   Tobacco Use   Smoking status: Former    Current packs/day: 0.00    Average packs/day: 1 pack/day for 10.0 years (10.0 ttl pk-yrs)    Types: Cigarettes    Start date: 11/09/1971    Quit date: 11/08/1981    Years since quitting: 42.6   Smokeless tobacco: Never  Vaping Use   Vaping status: Never Used  Substance Use Topics   Alcohol use: No   Drug use: No    Family History  Problem Relation Age of Onset   Dementia Mother    Cancer Father    Breast cancer Neg Hx    Allergies  Allergen Reactions   Other     Refuses whole blood but does accept albumin    Ditropan  [Oxybutynin ] Other (See Comments)    Unknown reaction  Flagyl [Metronidazole] Other (See Comments)    Chest pain Induced A-fib    Lipitor [Atorvastatin] Other (See Comments)    Unknown reaction   Septra [Sulfamethoxazole-Trimethoprim] Rash    Blistering rash     OBJECTIVE: Blood pressure 120/61, pulse 67, temperature 97.7 F (36.5 C), resp. rate 18, height 5' 6 (1.676 m), weight 99.8 kg, SpO2 96%.  Physical Exam Constitutional:      General: She is not in acute distress.    Appearance: Normal appearance. She is well-developed. She is not ill-appearing or diaphoretic.  HENT:     Head: Normocephalic and atraumatic.     Right Ear: Hearing and external ear normal.     Left Ear: Hearing and external ear normal.     Nose: No nasal deformity or rhinorrhea.  Eyes:     General: No scleral icterus.    Conjunctiva/sclera: Conjunctivae normal.     Right eye: Right conjunctiva is not injected.     Left eye: Left conjunctiva is not  injected.     Pupils: Pupils are equal, round, and reactive to light.  Neck:     Vascular: No JVD.  Cardiovascular:     Rate and Rhythm: Normal rate and regular rhythm.     Heart sounds: S1 normal and S2 normal.  Pulmonary:     Effort: Pulmonary effort is normal. No respiratory distress.     Breath sounds: No wheezing.  Abdominal:     General: Bowel sounds are normal. There is no distension.     Palpations: Abdomen is soft.     Tenderness: There is no abdominal tenderness.  Musculoskeletal:        General: Normal range of motion.     Right shoulder: Normal.     Left shoulder: Normal.     Cervical back: Normal range of motion and neck supple.     Right hip: Normal.     Left hip: Normal.     Right knee: Normal.     Left knee: Normal.  Lymphadenopathy:     Head:     Right side of head: No submandibular, preauricular or posterior auricular adenopathy.     Left side of head: No submandibular, preauricular or posterior auricular adenopathy.     Cervical: No cervical adenopathy.     Right cervical: No superficial or deep cervical adenopathy.    Left cervical: No superficial or deep cervical adenopathy.  Skin:    General: Skin is warm and dry.     Coloration: Skin is not pale.     Findings: No abrasion, bruising, ecchymosis, erythema, lesion or rash.     Nails: There is no clubbing.  Neurological:     Mental Status: She is alert and oriented to person, place, and time.     Sensory: No sensory deficit.     Coordination: Coordination normal.     Gait: Gait normal.  Psychiatric:        Attention and Perception: She is attentive.        Mood and Affect: Mood normal.        Speech: Speech normal.        Behavior: Behavior normal. Behavior is cooperative.        Thought Content: Thought content normal.        Judgment: Judgment normal.     Bilateral LE 06/13/2024:     Right toe:    Lab Results Lab Results  Component Value Date   WBC 2.2 (L) 06/13/2024  HGB 9.5 (L)  06/13/2024   HCT 28.6 (L) 06/13/2024   MCV 96.0 06/13/2024   PLT 83 (L) 06/13/2024    Lab Results  Component Value Date   CREATININE 0.71 06/13/2024   BUN 10 06/13/2024   NA 137 06/13/2024   K 3.6 06/13/2024   CL 108 06/13/2024   CO2 22 06/13/2024    Lab Results  Component Value Date   ALT 18 06/11/2024   AST 23 06/11/2024   ALKPHOS 56 06/11/2024   BILITOT 1.4 (H) 06/11/2024     Microbiology: Recent Results (from the past 240 hours)  Resp panel by RT-PCR (RSV, Flu A&B, Covid) Anterior Nasal Swab     Status: None   Collection Time: 06/10/24  6:00 AM   Specimen: Anterior Nasal Swab  Result Value Ref Range Status   SARS Coronavirus 2 by RT PCR NEGATIVE NEGATIVE Final   Influenza A by PCR NEGATIVE NEGATIVE Final   Influenza B by PCR NEGATIVE NEGATIVE Final    Comment: (NOTE) The Xpert Xpress SARS-CoV-2/FLU/RSV plus assay is intended as an aid in the diagnosis of influenza from Nasopharyngeal swab specimens and should not be used as a sole basis for treatment. Nasal washings and aspirates are unacceptable for Xpert Xpress SARS-CoV-2/FLU/RSV testing.  Fact Sheet for Patients: BloggerCourse.com  Fact Sheet for Healthcare Providers: SeriousBroker.it  This test is not yet approved or cleared by the [place]  FDA and has been authorized for detection and/or diagnosis of SARS-CoV-2 by FDA under an Emergency Use Authorization (EUA). This EUA will remain in effect (meaning this test can be used) for the duration of the COVID-19 declaration under Section 564(b)(1) of the Act, 21 U.S.C. section 360bbb-3(b)(1), unless the authorization is terminated or revoked.     Resp Syncytial Virus by PCR NEGATIVE NEGATIVE Final    Comment: (NOTE) Fact Sheet for Patients: BloggerCourse.com  Fact Sheet for Healthcare Providers: SeriousBroker.it  This test is not yet approved or cleared  by the [place]  FDA and has been authorized for detection and/or diagnosis of SARS-CoV-2 by FDA under an Emergency Use Authorization (EUA). This EUA will remain in effect (meaning this test can be used) for the duration of the COVID-19 declaration under Section 564(b)(1) of the Act, 21 U.S.C. section 360bbb-3(b)(1), unless the authorization is terminated or revoked.  Performed at [facility], [address] [place], [place] [address]   Blood culture (routine x 2)     Status: None (Preliminary result)   Collection Time: 06/10/24  6:47 AM   Specimen: BLOOD LEFT FOREARM  Result Value Ref Range Status   Specimen Description BLOOD LEFT FOREARM  Final   Special Requests   Final    BOTTLES DRAWN AEROBIC AND ANAEROBIC Blood Culture results may not be optimal due to an inadequate volume of blood received in culture bottles   Culture   Final    NO GROWTH 3 DAYS Performed at [facility], [address] [place], [place] [address]    Report Status PENDING  Incomplete  Blood culture (routine x 2)     Status: None (Preliminary result)   Collection Time: 06/10/24  6:52 AM   Specimen: BLOOD RIGHT ARM  Result Value Ref Range Status   Specimen Description BLOOD RIGHT ARM  Final   Special Requests   Final    BOTTLES DRAWN AEROBIC ONLY Blood Culture results may not be optimal due to an inadequate volume of blood received in culture bottles   Culture  Final    NO GROWTH 3 DAYS Performed at [facility], [address], [place], [place] [address]    Report Status PENDING  Incomplete    Jomarie Fleeta Rothman, MD [facility] [phone] pager  06/13/2024, 2:32 PM

## 2024-06-13 NOTE — Plan of Care (Signed)
   Problem: Education: Goal: Knowledge of General Education information will improve Description Including pain rating scale, medication(s)/side effects and non-pharmacologic comfort measures Outcome: Progressing

## 2024-06-13 NOTE — Care Management Important Message (Signed)
 Important Message  Patient Details  Name: Kathleen Wiley MRN: [id number] Date of Birth: 1946-01-02   Important Message Given:  Yes - Medicare IM     Jon Cruel 06/13/2024, 12:49 PM

## 2024-06-13 NOTE — Progress Notes (Signed)
 Mobility Specialist Progress Note:   06/13/24 1030  Mobility  Activity Ambulated with assistance  Level of Assistance Contact guard assist, steadying assist  Assistive Device Front wheel walker  Distance Ambulated (ft) 350 ft  Activity Response Tolerated well  Mobility Referral Yes  Mobility visit 1 Mobility  Mobility Specialist Start Time (ACUTE ONLY) 1030  Mobility Specialist Stop Time (ACUTE ONLY) 1045  Mobility Specialist Time Calculation (min) (ACUTE ONLY) 15 min   Pt agreeable to mobility session. Required only minG assist for safety with RW. No c/o throughout. Back in bed with all needs met.   Therisa Rana Mobility Specialist Please contact via [employer] or  Rehab office at [phone]

## 2024-06-14 ENCOUNTER — Other Ambulatory Visit (HOSPITAL_COMMUNITY): Payer: Self-pay

## 2024-06-14 DIAGNOSIS — L0390 Cellulitis, unspecified: Secondary | ICD-10-CM

## 2024-06-14 DIAGNOSIS — L03115 Cellulitis of right lower limb: Secondary | ICD-10-CM | POA: Diagnosis not present

## 2024-06-14 LAB — CBC
HCT: 29.8 % — ABNORMAL LOW (ref 36.0–46.0)
Hemoglobin: 9.7 g/dL — ABNORMAL LOW (ref 12.0–15.0)
MCH: 31.4 pg (ref 26.0–34.0)
MCHC: 32.6 g/dL (ref 30.0–36.0)
MCV: 96.4 fL (ref 80.0–100.0)
Platelets: 90 K/uL — ABNORMAL LOW (ref 150–400)
RBC: 3.09 MIL/uL — ABNORMAL LOW (ref 3.87–5.11)
RDW: 14.3 % (ref 11.5–15.5)
WBC: 2 K/uL — ABNORMAL LOW (ref 4.0–10.5)
nRBC: 0 % (ref 0.0–0.2)

## 2024-06-14 LAB — BASIC METABOLIC PANEL WITH GFR
Anion gap: 6 (ref 5–15)
BUN: 7 mg/dL — ABNORMAL LOW (ref 8–23)
CO2: 23 mmol/L (ref 22–32)
Calcium: 8.1 mg/dL — ABNORMAL LOW (ref 8.9–10.3)
Chloride: 109 mmol/L (ref 98–111)
Creatinine, Ser: 0.84 mg/dL (ref 0.44–1.00)
GFR, Estimated: >60 mL/min (ref >60)
Glucose, Bld: 112 mg/dL — ABNORMAL HIGH (ref 70–99)
Potassium: 3.6 mmol/L (ref 3.5–5.1)
Sodium: 138 mmol/L (ref 135–145)

## 2024-06-14 LAB — MAGNESIUM: Magnesium: 1.8 mg/dL (ref 1.7–2.4)

## 2024-06-14 MED ORDER — CEFADROXIL 500 MG PO CAPS
500.0000 mg | ORAL_CAPSULE | Freq: Two times a day (BID) | ORAL | 0 refills | Status: AC
  Filled 2024-06-14: qty 28, 14d supply, fill #0

## 2024-06-14 NOTE — Progress Notes (Signed)
 Mobility Specialist Progress Note:    06/14/24 0959  Mobility  Activity Ambulated with assistance (In hallway)  Level of Assistance Contact guard assist, steadying assist  Assistive Device Front wheel walker  Distance Ambulated (ft) 550 ft  Activity Response Tolerated well  Mobility Referral Yes  Mobility visit 1 Mobility  Mobility Specialist Start Time (ACUTE ONLY) [id number]  Mobility Specialist Stop Time (ACUTE ONLY) 0946  Mobility Specialist Time Calculation (min) (ACUTE ONLY) 10 min   Received pt in bed and agreeable to mobility. No physical assistance needed. No c/o throughout. Returned to room without fault. Left pt EOB with personal belongings and call light within reach. MD present.  Lavanda Pollack Mobility Specialist  Please contact via [employer] or  Rehab Office [phone]

## 2024-06-14 NOTE — Plan of Care (Signed)
   Problem: Education: Goal: Knowledge of General Education information will improve Description: Including pain rating scale, medication(s)/side effects and non-pharmacologic comfort measures Outcome: Progressing   Problem: Health Behavior/Discharge Planning: Goal: Ability to manage health-related needs will improve Outcome: Progressing   Problem: Activity: Goal: Risk for activity intolerance will decrease Outcome: Progressing

## 2024-06-14 NOTE — Progress Notes (Signed)
 Subjective: No new complaints   Antibiotics:  Anti-infectives (From admission, onward)    Start     Dose/Rate Route Frequency Ordered Stop   06/14/24 0000  cefadroxil  (DURICEF) 500 MG capsule       Note to Pharmacy: Take 6 days worth during this course. And Save the rest for future needs, Discussed with ID   500 mg Oral 2 times daily 06/14/24 0949 06/28/24 2359   06/13/24 1400  ceFAZolin  (ANCEF ) IVPB 2g/100 mL premix        2 g 200 mL/hr over 30 Minutes Intravenous Every 8 hours 06/13/24 1141     06/11/24 1300  vancomycin  (VANCOCIN ) IVPB 1000 mg/200 mL premix  Status:  Discontinued        1,000 mg 200 mL/hr over 60 Minutes Intravenous Every 24 hours 06/10/24 1537 06/11/24 0936   06/11/24 1300  vancomycin  (VANCOREADY) IVPB 1250 mg/250 mL  Status:  Discontinued        1,250 mg 166.7 mL/hr over 90 Minutes Intravenous Every 24 hours 06/11/24 0936 06/11/24 1057   06/11/24 1200  cefTRIAXone  (ROCEPHIN ) 2 g in sodium chloride  0.9 % 100 mL IVPB  Status:  Discontinued        2 g 200 mL/hr over 30 Minutes Intravenous Every 24 hours 06/10/24 1150 06/13/24 1141   06/11/24 1145  linezolid  (ZYVOX ) tablet 600 mg  Status:  Discontinued        600 mg Oral Every 12 hours 06/11/24 1057 06/13/24 1141   06/10/24 1215  vancomycin  (VANCOREADY) IVPB 2000 mg/400 mL        2,000 mg 200 mL/hr over 120 Minutes Intravenous  Once 06/10/24 1201 06/10/24 1500   06/10/24 1200  vancomycin  (VANCOCIN ) IVPB 1000 mg/200 mL premix  Status:  Discontinued        1,000 mg 200 mL/hr over 60 Minutes Intravenous  Once 06/10/24 1151 06/10/24 1201   06/10/24 1200  cefTRIAXone  (ROCEPHIN ) 1 g in sodium chloride  0.9 % 100 mL IVPB        1 g 200 mL/hr over 30 Minutes Intravenous  Once 06/10/24 1151 06/10/24 1625   06/10/24 0700  cefTRIAXone  (ROCEPHIN ) 1 g in sodium chloride  0.9 % 100 mL IVPB        1 g 200 mL/hr over 30 Minutes Intravenous  Once 06/10/24 0646 06/10/24 0810   06/10/24 0000  cephALEXin  (KEFLEX ) 500 MG  capsule  Status:  Discontinued        500 mg Oral 3 times daily 06/10/24 9351 06/14/24        Medications: Scheduled Meds:  escitalopram   20 mg Oral QHS   gabapentin   300 mg Oral BID   metoprolol  succinate  25 mg Oral QHS   pantoprazole   40 mg Oral Daily   rosuvastatin   5 mg Oral QHS   sodium chloride  flush  3 mL Intravenous Q12H   Continuous Infusions:   ceFAZolin  (ANCEF ) IV 2 g (06/14/24 0517)   PRN Meds:.acetaminophen  **OR** acetaminophen , diphenhydrAMINE , glucagon  (human recombinant), hydrALAZINE , ipratropium-albuterol , metoprolol  tartrate, ondansetron  (ZOFRAN ) IV, phenol, senna-docusate    Objective: Weight change:   Intake/Output Summary (Last 24 hours) at 06/14/2024 1012 Last data filed at 06/13/2024 2225 Gross per 24 hour  Intake 1060 ml  Output --  Net 1060 ml   Blood pressure 135/64, pulse 65, temperature 98.3 F (36.8 C), temperature source Oral, resp. rate 17, height 5' 6 (1.676 m), weight 99.8 kg, SpO2 96%. Temp:  [97.7 F (36.5 C)-98.5  F (36.9 C)] 98.3 F (36.8 C) (08/07 0829) Pulse Rate:  [65-72] 65 (08/07 0829) Resp:  [17-20] 17 (08/07 0829) BP: (125-135)/(56-64) 135/64 (08/07 0829) SpO2:  [94 %-96 %] 96 % (08/07 0829)  Physical Exam: Physical Exam Constitutional:      General: She is not in acute distress.    Appearance: She is well-developed. She is not diaphoretic.  HENT:     Head: Normocephalic and atraumatic.     Right Ear: External ear normal.     Left Ear: External ear normal.     Mouth/Throat:     Pharynx: No oropharyngeal exudate.  Eyes:     General: No scleral icterus.    Conjunctiva/sclera: Conjunctivae normal.     Pupils: Pupils are equal, round, and reactive to light.  Cardiovascular:     Rate and Rhythm: Normal rate and regular rhythm.  Pulmonary:     Effort: Pulmonary effort is normal. No respiratory distress.     Breath sounds: No wheezing or rales.  Abdominal:     General: Bowel sounds are normal. There is no distension.      Palpations: Abdomen is soft.     Tenderness: There is no abdominal tenderness. There is no rebound.  Musculoskeletal:        General: No tenderness. Normal range of motion.  Lymphadenopathy:     Cervical: No cervical adenopathy.  Skin:    General: Skin is warm and dry.     Coloration: Skin is not pale.     Findings: No erythema or rash.  Neurological:     General: No focal deficit present.     Mental Status: She is alert and oriented to person, place, and time.     Motor: No abnormal muscle tone.     Coordination: Coordination normal.  Psychiatric:        Mood and Affect: Mood normal.        Behavior: Behavior normal.        Thought Content: Thought content normal.        Judgment: Judgment normal.     Legs    CBC:    BMET Recent Labs    06/13/24 0722 06/14/24 0620  NA 137 138  K 3.6 3.6  CL 108 109  CO2 22 23  GLUCOSE 97 112*  BUN 10 7*  CREATININE 0.71 0.84  CALCIUM  8.0* 8.1*     Liver Panel  No results for input(s): PROT, ALBUMIN , AST, ALT, ALKPHOS, BILITOT, BILIDIR, IBILI in the last 72 hours.     Sedimentation Rate Recent Labs    06/12/24 0651  ESRSEDRATE 30*   C-Reactive Protein Recent Labs    06/12/24 0651  CRP 6.9*    Micro Results: Recent Results (from the past 720 hours)  Resp panel by RT-PCR (RSV, Flu A&B, Covid) Anterior Nasal Swab     Status: None   Collection Time: 06/10/24  6:00 AM   Specimen: Anterior Nasal Swab  Result Value Ref Range Status   SARS Coronavirus 2 by RT PCR NEGATIVE NEGATIVE Final   Influenza A by PCR NEGATIVE NEGATIVE Final   Influenza B by PCR NEGATIVE NEGATIVE Final    Comment: (NOTE) The Xpert Xpress SARS-CoV-2/FLU/RSV plus assay is intended as an aid in the diagnosis of influenza from Nasopharyngeal swab specimens and should not be used as a sole basis for treatment. Nasal washings and aspirates are unacceptable for Xpert Xpress SARS-CoV-2/FLU/RSV testing.  Fact Sheet for  Patients: BloggerCourse.com  Fact Sheet for  Healthcare Providers: SeriousBroker.it  This test is not yet approved or cleared by the [place]  FDA and has been authorized for detection and/or diagnosis of SARS-CoV-2 by FDA under an Emergency Use Authorization (EUA). This EUA will remain in effect (meaning this test can be used) for the duration of the COVID-19 declaration under Section 564(b)(1) of the Act, 21 U.S.C. section 360bbb-3(b)(1), unless the authorization is terminated or revoked.     Resp Syncytial Virus by PCR NEGATIVE NEGATIVE Final    Comment: (NOTE) Fact Sheet for Patients: BloggerCourse.com  Fact Sheet for Healthcare Providers: SeriousBroker.it  This test is not yet approved or cleared by the [place]  FDA and has been authorized for detection and/or diagnosis of SARS-CoV-2 by FDA under an Emergency Use Authorization (EUA). This EUA will remain in effect (meaning this test can be used) for the duration of the COVID-19 declaration under Section 564(b)(1) of the Act, 21 U.S.C. section 360bbb-3(b)(1), unless the authorization is terminated or revoked.  Performed at [facility], [address], [place], [place] [address]   Blood culture (routine x 2)     Status: None (Preliminary result)   Collection Time: 06/10/24  6:47 AM   Specimen: BLOOD LEFT FOREARM  Result Value Ref Range Status   Specimen Description BLOOD LEFT FOREARM  Final   Special Requests   Final    BOTTLES DRAWN AEROBIC AND ANAEROBIC Blood Culture results may not be optimal due to an inadequate volume of blood received in culture bottles   Culture   Final    NO GROWTH 3 DAYS Performed at [facility], [address], [place], [place] [address]    Report Status PENDING  Incomplete  Blood culture (routine x 2)     Status: None (Preliminary result)   Collection Time: 06/10/24  6:52  AM   Specimen: BLOOD RIGHT ARM  Result Value Ref Range Status   Specimen Description BLOOD RIGHT ARM  Final   Special Requests   Final    BOTTLES DRAWN AEROBIC ONLY Blood Culture results may not be optimal due to an inadequate volume of blood received in culture bottles   Culture   Final    NO GROWTH 3 DAYS Performed at [facility], [address], [place], [place] [address]    Report Status PENDING  Incomplete    Studies/Results: CT TIBIA FIBULA RIGHT W CONTRAST Result Date: 06/12/2024 CLINICAL DATA:  Soft tissue infection suspected, lower leg, xray done EXAM: CT OF THE LOWER RIGHT EXTREMITY WITH CONTRAST TECHNIQUE: Multidetector CT imaging of the lower right extremity was performed according to the standard protocol following intravenous contrast administration. RADIATION DOSE REDUCTION: This exam was performed according to the departmental dose-optimization program which includes automated exposure control, adjustment of the mA and/or kV according to patient size and/or use of iterative reconstruction technique. CONTRAST:  75mL OMNIPAQUE  IOHEXOL  350 MG/ML SOLN COMPARISON:  Right tibia and fibula radiographs dated 05/03/2022. FINDINGS: Bones/Joint/Cartilage No acute fracture or dislocation. Status post right total knee arthroplasty. Hardware appears well seated with appropriate alignment. Postoperative changes of the distal first metatarsal and first proximal phalanx related to prior bunionectomy. Remote osteotomy of the second metatarsal head. 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 Mild degenerative arthropathy of the forefoot. Ligaments Ligaments are suboptimally evaluated by CT. Muscles and Tendons Atrophy of the intrinsic foot musculature may reflect chronic denervation changes. Muscles are otherwise within normal limits. No intramuscular fluid collection. Visualized tendons  are intact. Soft tissue Generalized subcutaneous  edema extending from the proximal calf through the dorsal foot with overlying cutaneous thickening. No loculated fluid collection. No soft tissue gas. No soft tissue mass. IMPRESSION: 1. Generalized subcutaneous edema extending from the proximal calf through the dorsal foot with overlying cutaneous thickening, which could reflect cellulitis in the appropriate clinical setting. No loculated fluid collection. No soft tissue gas. 2. No acute osseous abnormality. 3. Intact right total knee arthroplasty. 4. Postoperative changes related to prior bunionectomy and osteotomy of the second metatarsal head. 5. Mild-to-moderate degenerative arthropathy throughout the foot. Electronically Signed   By: Harrietta Sherry M.D.   On: 06/12/2024 12:25      Assessment/Plan:  INTERVAL HISTORY: Erythema improving   Principal Problem:   Cellulitis of right lower extremity Active Problems:   GERD (gastroesophageal reflux disease)   Essential hypertension, benign   Hypercholesterolemia   Controlled type 2 diabetes mellitus with neuropathy (HCC)   Obesity with body mass index (BMI) of 30.0 to 39.9   Sepsis (HCC)   Severe sepsis (HCC)   AKI (acute kidney injury) (HCC)   Nausea and vomiting    Kathleen Wiley is a 78 y.o. female with bilateral cellulitis after pedicure.  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 Professional time spent includes the following  activities: Preparing to see the patient (review of tests), Obtaining and/or reviewing separately obtained history (admission/discharge record), Performing a medically appropriate examination and/or evaluation , Ordering medications/tests/procedures, referring and communicating with other health care professionals, Documenting clinical information in the EMR, Independently interpreting results (not separately reported), Communicating results to the patient/family/caregiver, Counseling and educating the patient/family/caregiver and Care coordination (not separately reported).   Evaluation of the patient requires complex antimicrobial therapy evaluation, counseling , isolation needs to reduce disease transmission and risk assessment and mitigation.    Kathleen Wiley has an appointment on 07/23/2024 at 130 PM with Dr. Fleeta Rothman at   at  [facility], which  is located in the [facility] at  [address] [place] in [place].  [address], which is located to the left of the elevators.  Phone: [phone]  Fax: [phone]  [email or web address]  The patient should arrive 30 minutes prior to their appoitment.    LOS: 4 days   Jomarie Fleeta Rothman 06/14/2024, 10:12 AM

## 2024-06-14 NOTE — Plan of Care (Signed)

## 2024-06-14 NOTE — Discharge Summary (Signed)
 Physician Discharge Summary  Kathleen Wiley FMW:[id number] DOB: [birth year] DOA: 06/10/2024  PCP: Verena Mems, MD  Admit date: 06/10/2024 Discharge date: 06/14/2024  Admitted From: Home Disposition: Home  Recommendations for Outpatient Follow-up:  Follow up with PCP in 1-2 weeks Please obtain BMP/CBC in one week your next doctors visit.  6 more days of p.o. cefadroxil  to complete 10-day course.  Additional tablets also given for future use per ID recommendations Patient to follow-up outpatient ID Is to elevate her legs   Discharge Condition: Stable CODE STATUS: Full code Diet recommendation: Diabetic  Brief/Interim Summary: Brief Narrative:   78 y.o. female with medical history significant of hypertension, hyperlipidemia, paroxysmal atrial fibrillation, diabetes mellitus type 2, myelodysplastic syndrome, prior h/o cellulitis presents with chills and vomiting.  In the ER patient noted to have sepsis secondary to right lower extremity cellulitis.  Started on Rocephin .  Lower extremity Dopplers were negative for DVT. CT is negative for any acute deep infection or abscess.  Seen by infectious disease, narrowed down antibiotics to cefazolin  and eventually will be on cefadroxil  now on cefadroxil .  She will take 6 more days of p.o. tablet to complete 10-day course.  She is also been given additional tablets of cefadroxil  with ID recommendations.  They will follow her up outpatient. AKI has resolved  Assessment & Plan:  Principal Problem:   Cellulitis of right lower extremity Active Problems:   Severe sepsis (HCC)   AKI (acute kidney injury) (HCC)   Nausea and vomiting   Essential hypertension, benign   Controlled type 2 diabetes mellitus with neuropathy (HCC)   Hypercholesterolemia   GERD (gastroesophageal reflux disease)   Obesity with body mass index (BMI) of 30.0 to 39.9   Sepsis (HCC)     Severe sepsis secondary to right lower extremity cellulitis without purulent  discharge Right lower extremity cellulitis along with mild left lower extremity erythema still persist along with some swelling of right lower extremity.  CT is negative for any acute deep infection or abscess.  Seen by infectious disease, narrowed down antibiotics to cefazolin  and eventually will be on cefadroxil  now on cefadroxil .  She will take 6 more days of p.o. tablet to complete 10-day course.  She is also been given additional tablets of cefadroxil  with ID recommendations.  They will follow her up outpatient. - Lower extremity Dopplers is negative    Acute kidney injury; resolved.  -Baseline creatinine 0.7, admission creatinine 1.1  Diffuse rash; improved.  - Likely from red man syndrome.  Resolved after discontinuing vancomycin    Nausea and vomiting Elevated total bilirubin, improving -resolved.    Essential hypertension -On metoprolol .  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  Will continue to monitor.  Follows with outpatient Dr. 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    DVT prophylaxis: SCDs due to thrombocytopenia    Code Status: Full Code Family Communication:   Status is: Inpatient     Subjective:  Feeling great no complaints  Examination:  General exam: Appears calm and comfortable  Respiratory system: Clear to auscultation. Respiratory effort normal. Cardiovascular system: S1 & S2  heard, RRR. No JVD, murmurs, rubs, gallops or clicks. No pedal edema. Gastrointestinal system: Abdomen is nondistended, soft and nontender. No organomegaly  or masses felt. Normal bowel sounds heard. Central nervous system: Alert and oriented. No focal neurological deficits. Extremities: Symmetric 5 x 5 power. Skin: Right lower extremity erythema and left lower extremity demarcated, improved.  No warmth Psychiatry: Judgement and insight appear normal. Mood & affect appropriate.    Discharge Diagnoses:  Principal Problem:   Cellulitis of right lower extremity Active Problems:   Severe sepsis (HCC)   AKI (acute kidney injury) (HCC)   Nausea and vomiting   Essential hypertension, benign   Controlled type 2 diabetes mellitus with neuropathy (HCC)   Hypercholesterolemia   GERD (gastroesophageal reflux disease)   Obesity with body mass index (BMI) of 30.0 to 39.9   Sepsis (HCC)   Recurrent cellulitis      Discharge Exam: Vitals:   06/14/24 0400 06/14/24 0829  BP: (!) 130/59 135/64  Pulse: 69 65  Resp: 20 17  Temp: 98.5 F (36.9 C) 98.3 F (36.8 C)  SpO2: 94% 96%   Vitals:   06/13/24 1527 06/13/24 1946 06/14/24 0400 06/14/24 0829  BP: (!) 125/56 127/64 (!) 130/59 135/64  Pulse: 72 67 69 65  Resp: 18 20 20 17   Temp: 97.7 F (36.5 C) 97.9 F (36.6 C) 98.5 F (36.9 C) 98.3 F (36.8 C)  TempSrc:  Oral Oral Oral  SpO2: 96% 95% 94% 96%  Weight:      Height:          Discharge Instructions   Allergies as of 06/14/2024       Reactions   Other    Refuses whole blood but does accept albumin    Ditropan  [oxybutynin ] Other (See Comments)   Unknown reaction   Flagyl [metronidazole] Other (See Comments)   Chest pain Induced A-fib   Lipitor [atorvastatin] Other (See Comments)   Unknown reaction   Septra [sulfamethoxazole-trimethoprim] Rash   Blistering rash        Medication List     STOP taking these medications    ibuprofen  200 MG tablet Commonly known as: ADVIL        TAKE these medications    cefadroxil  500 MG capsule Commonly known as: DURICEF Take 1 capsule (500 mg total) by mouth 2 (two) times daily  for 14 days.   escitalopram  20 MG tablet Commonly known as: LEXAPRO  Take 20 mg by mouth at bedtime.   furosemide  20 MG tablet Commonly known as: LASIX  Take 20 mg by mouth daily.   gabapentin  300 MG capsule Commonly known as: NEURONTIN  Take 300 mg by mouth 2 (two) times daily.   metoprolol  succinate 25 MG 24 hr tablet Commonly known as: Toprol  XL Take 0.5 tablets (12.5 mg total) by mouth daily. What changed:  how much to take when to take this   omeprazole 20 MG capsule Commonly known as: PRILOSEC Take 20 mg by mouth daily.   potassium chloride  10 MEQ tablet Commonly known as: KLOR-CON  Take 10 mEq by mouth daily.   rosuvastatin  5 MG tablet Commonly known as: CRESTOR  Take 5 mg by mouth at bedtime.   VITAMIN B-12 PO Take 1 tablet by mouth daily.   VITAMIN D-3 PO Take 1 capsule by mouth daily.        Follow-up Information     Verena Mems, MD Follow up in 1 week(s).   Specialty: Family Medicine Contact information: [address] [place] [address]  [phone]                Allergies  Allergen Reactions   Other     Refuses whole blood but does accept albumin    Ditropan  [Oxybutynin ] Other (See Comments)    Unknown reaction   Flagyl [Metronidazole] Other (See Comments)    Chest pain Induced A-fib    Lipitor [Atorvastatin] Other (See Comments)    Unknown reaction   Septra [Sulfamethoxazole-Trimethoprim] Rash    Blistering rash     You were cared for by a hospitalist during your hospital stay. If you have any questions about your discharge medications or the care you received while you were in the hospital after you are discharged, you can call the unit and asked to speak with the hospitalist on call if the hospitalist that took care of you is not available. Once you are discharged, your primary care physician will handle any further medical issues. Please note that no refills for any discharge medications will be  authorized once you are discharged, as it is imperative that you return to your primary care physician (or establish a relationship with a primary care physician if you do not have one) for your aftercare needs so that they can reassess your need for medications and monitor your lab values.  You were cared for by a hospitalist during your hospital stay. If you have any questions about your discharge medications or the care you received while you were in the hospital after you are discharged, you can call the unit and asked to speak with the hospitalist on call if the hospitalist that took care of you is not available. Once you are discharged, your primary care physician will handle any further medical issues. Please note that NO REFILLS for any discharge medications will be authorized once you are discharged, as it is imperative that you return to your primary care physician (or establish a relationship with a primary care physician if you do not have one) for your aftercare needs so that they can reassess your need for medications and monitor your lab values.  Please request your Prim.MD to go over all Hospital Tests and Procedure/Radiological results at the follow up, please get all Hospital records sent to your Prim MD by signing hospital release before you go home.  Get CBC, CMP, 2 view Chest X ray checked  by Primary MD during your next visit or SNF MD in 5-7 days ( we routinely change or add medications that can affect your baseline labs and fluid status, therefore we recommend that you get the mentioned basic workup next visit with your PCP, your PCP may decide not to get them or add new tests based on their clinical decision)  On your next visit with your primary care physician please Get Medicines reviewed and adjusted.  If you experience worsening of your admission symptoms, develop shortness of breath, life threatening emergency, suicidal or homicidal thoughts you must seek medical attention  immediately by calling 911 or calling your MD immediately  if symptoms less severe.  You Must read complete instructions/literature along with all the possible adverse reactions/side effects for all the Medicines you take and that have been prescribed to you. Take any new Medicines after you have completely understood and accpet all the possible adverse reactions/side effects.   Do not drive, operate heavy machinery, perform activities at heights, swimming or participation in water activities or provide baby sitting services if your were admitted for syncope or siezures until you  have seen by Primary MD or a Neurologist and advised to do so again.  Do not drive when taking Pain medications.   Procedures/Studies: CT TIBIA FIBULA RIGHT W CONTRAST Result Date: 06/12/2024 CLINICAL DATA:  Soft tissue infection suspected, lower leg, xray done EXAM: CT OF THE LOWER RIGHT EXTREMITY WITH CONTRAST TECHNIQUE: Multidetector CT imaging of the lower right extremity was performed according to the standard protocol following intravenous contrast administration. RADIATION DOSE REDUCTION: This exam was performed according to the departmental dose-optimization program which includes automated exposure control, adjustment of the mA and/or kV according to patient size and/or use of iterative reconstruction technique. CONTRAST:  75mL OMNIPAQUE  IOHEXOL  350 MG/ML SOLN COMPARISON:  Right tibia and fibula radiographs dated 05/03/2022. FINDINGS: Bones/Joint/Cartilage No acute fracture or dislocation. Status post right total knee arthroplasty. Hardware appears well seated with appropriate alignment. Postoperative changes of the distal first metatarsal and first proximal phalanx related to prior bunionectomy. Remote osteotomy of the second metatarsal head. 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 Mild degenerative arthropathy of the forefoot. Ligaments Ligaments are  suboptimally evaluated by CT. Muscles and Tendons Atrophy of the intrinsic foot musculature may reflect chronic denervation changes. Muscles are otherwise within normal limits. No intramuscular fluid collection. Visualized tendons are intact. Soft tissue Generalized subcutaneous edema extending from the proximal calf through the dorsal foot with overlying cutaneous thickening. No loculated fluid collection. No soft tissue gas. No soft tissue mass. IMPRESSION: 1. Generalized subcutaneous edema extending from the proximal calf through the dorsal foot with overlying cutaneous thickening, which could reflect cellulitis in the appropriate clinical setting. No loculated fluid collection. No soft tissue gas. 2. No acute osseous abnormality. 3. Intact right total knee arthroplasty. 4. Postoperative changes related to prior bunionectomy and osteotomy of the second metatarsal head. 5. Mild-to-moderate degenerative arthropathy throughout the foot. Electronically Signed   By: Harrietta Sherry M.D.   On: 06/12/2024 12:25   VAS US  LOWER EXTREMITY VENOUS (DVT) (ONLY [facility] & [facility]) Result Date: 06/10/2024  Lower Venous DVT Study Patient Name:  Kathleen Wiley  Date of Exam:   06/10/2024 Medical Rec #: [id number]        Accession #:    [id number] Date of Birth: 07-04-1946        Patient Gender: F Patient Age:   79 years Exam Location:  [facility] Procedure:      VAS US  LOWER EXTREMITY VENOUS (DVT) Referring Phys: CYNDEE COUNTRYMAN --------------------------------------------------------------------------------  Indications: Swelling, Pain, and redness in calf.  Comparison Study: 04/21/19 - Negative Performing Technologist: Ricka Sturdivant-Jones RDMS, RVT  Examination Guidelines: A complete evaluation includes B-mode imaging, spectral Doppler, color Doppler, and power Doppler as needed of all accessible portions of each vessel. Bilateral testing is considered an integral part of a complete examination. Limited examinations for  reoccurring indications may be performed as noted. The reflux portion of the exam is performed with the patient in reverse Trendelenburg.  +---------+---------------+---------+-----------+----------+--------------+ RIGHT    CompressibilityPhasicitySpontaneityPropertiesThrombus Aging +---------+---------------+---------+-----------+----------+--------------+ CFV      Full           Yes      Yes                                 +---------+---------------+---------+-----------+----------+--------------+ SFJ      Full                                                        +---------+---------------+---------+-----------+----------+--------------+  FV Prox  Full                                                        +---------+---------------+---------+-----------+----------+--------------+ FV Mid   Full                                                        +---------+---------------+---------+-----------+----------+--------------+ FV DistalFull                                                        +---------+---------------+---------+-----------+----------+--------------+ PFV      Full                                                        +---------+---------------+---------+-----------+----------+--------------+ POP      Full           Yes      Yes                                 +---------+---------------+---------+-----------+----------+--------------+ PTV      Full                                                        +---------+---------------+---------+-----------+----------+--------------+ PERO     Full                                                        +---------+---------------+---------+-----------+----------+--------------+   +----+---------------+---------+-----------+----------+--------------+ LEFTCompressibilityPhasicitySpontaneityPropertiesThrombus Aging +----+---------------+---------+-----------+----------+--------------+  CFV Full           Yes      Yes                                 +----+---------------+---------+-----------+----------+--------------+ SFJ Full                                                        +----+---------------+---------+-----------+----------+--------------+     Summary: RIGHT: - There is no evidence of deep vein thrombosis in the lower extremity.  - No cystic structure found in the popliteal fossa.  LEFT: - No evidence of common femoral vein obstruction.   *See table(s) above for measurements and observations. Electronically signed by Debby Robertson on 06/10/2024 at 3:24:34 PM.  Final    DG Chest Portable 1 View Result Date: 06/10/2024 CLINICAL DATA:  78 year old female with chest pain and weakness. Hospitalized for meningitis in June. EXAM: PORTABLE CHEST 1 VIEW COMPARISON:  Portable chest 04/19/2022 and earlier. FINDINGS: Portable AP view at 0554 hours. Larger, improved lung volumes. Stable cardiomegaly and mediastinal contours. Visualized tracheal air column is within normal limits. Allowing for portable technique the lungs are clear. No pneumothorax. No pleural effusion is evident. No acute osseous abnormality identified. IMPRESSION: Stable cardiomegaly. No acute cardiopulmonary abnormality. Electronically Signed   By: VEAR Hurst M.D.   On: 06/10/2024 06:37   CT HEAD WO CONTRAST ( ) Result Date: 06/10/2024 CLINICAL DATA:  78 year old female with weakness. Hospitalized for meningitis in June. EXAM: CT HEAD WITHOUT CONTRAST TECHNIQUE: Contiguous axial images were obtained from the base of the skull through the vertex without intravenous contrast. RADIATION DOSE REDUCTION: This exam was performed according to the departmental dose-optimization program which includes automated exposure control, adjustment of the mA and/or kV according to patient size and/or use of iterative reconstruction technique. COMPARISON:  Head CT 04/20/2022. FINDINGS: Brain: Cerebral volume remains normal for age.  No midline shift, ventriculomegaly, mass effect, evidence of mass lesion, intracranial hemorrhage or evidence of cortically based acute infarction. Gray-white differentiation stable and normal for age. No cortical encephalomalacia identified. Minimal cerebral white matter hypodensity most apparent in the left frontal lobe and stable. Normal basilar cisterns. Vascular: Calcified atherosclerosis at the skull base. No suspicious intracranial vascular hyperdensity. Skull: Stable and intact. Sinuses/Orbits: Chronic right maxillary sinusitis with mucoperiosteal thickening unchanged. Other sinuses, tympanic cavities and mastoids remain well aerated. Other: No acute orbit or scalp soft tissue finding. IMPRESSION: 1. Stable since 2023 and negative for age noncontrast CT appearance of the brain. 2. Chronic right maxillary sinusitis. Electronically Signed   By: VEAR Hurst M.D.   On: 06/10/2024 06:37     The results of significant diagnostics from this hospitalization (including imaging, microbiology, ancillary and laboratory) are listed below for reference.     Microbiology: Recent Results (from the past 240 hours)  Resp panel by RT-PCR (RSV, Flu A&B, Covid) Anterior Nasal Swab     Status: None   Collection Time: 06/10/24  6:00 AM   Specimen: Anterior Nasal Swab  Result Value Ref Range Status   SARS Coronavirus 2 by RT PCR NEGATIVE NEGATIVE Final   Influenza A by PCR NEGATIVE NEGATIVE Final   Influenza B by PCR NEGATIVE NEGATIVE Final    Comment: (NOTE) The Xpert Xpress SARS-CoV-2/FLU/RSV plus assay is intended as an aid in the diagnosis of influenza from Nasopharyngeal swab specimens and should not be used as a sole basis for treatment. Nasal washings and aspirates are unacceptable for Xpert Xpress SARS-CoV-2/FLU/RSV testing.  Fact Sheet for Patients: BloggerCourse.com  Fact Sheet for Healthcare Providers: SeriousBroker.it  This test is not yet  approved or cleared by the [place]  FDA and has been authorized for detection and/or diagnosis of SARS-CoV-2 by FDA under an Emergency Use Authorization (EUA). This EUA will remain in effect (meaning this test can be used) for the duration of the COVID-19 declaration under Section 564(b)(1) of the Act, 21 U.S.C. section 360bbb-3(b)(1), unless the authorization is terminated or revoked.     Resp Syncytial Virus by PCR NEGATIVE NEGATIVE Final    Comment: (NOTE) Fact Sheet for Patients: BloggerCourse.com  Fact Sheet for Healthcare Providers: SeriousBroker.it  This test is not yet approved or cleared by the [place]  FDA and has been authorized for  detection and/or diagnosis of SARS-CoV-2 by FDA under an Emergency Use Authorization (EUA). This EUA will remain in effect (meaning this test can be used) for the duration of the COVID-19 declaration under Section 564(b)(1) of the Act, 21 U.S.C. section 360bbb-3(b)(1), unless the authorization is terminated or revoked.  Performed at [facility], [address] [place], [place] [address]   Blood culture (routine x 2)     Status: None (Preliminary result)   Collection Time: 06/10/24  6:47 AM   Specimen: BLOOD LEFT FOREARM  Result Value Ref Range Status   Specimen Description BLOOD LEFT FOREARM  Final   Special Requests   Final    BOTTLES DRAWN AEROBIC AND ANAEROBIC Blood Culture results may not be optimal due to an inadequate volume of blood received in culture bottles   Culture   Final    NO GROWTH 4 DAYS Performed at [facility], [address], [place], [place] [address]    Report Status PENDING  Incomplete  Blood culture (routine x 2)     Status: None (Preliminary result)   Collection Time: 06/10/24  6:52 AM   Specimen: BLOOD RIGHT ARM  Result Value Ref Range Status   Specimen Description BLOOD RIGHT ARM  Final   Special Requests   Final    BOTTLES DRAWN  AEROBIC ONLY Blood Culture results may not be optimal due to an inadequate volume of blood received in culture bottles   Culture   Final    NO GROWTH 4 DAYS Performed at [facility], [address], [place], [place] [address]    Report Status PENDING  Incomplete     Labs: BNP (last 3 results) No results for input(s): BNP in the last 8760 hours. Basic Metabolic Panel: Recent Labs  Lab 06/10/24 0531 06/11/24 0749 06/12/24 0653 06/13/24 0722 06/14/24 0620  NA 136 134* 137 137 138  K 3.7 3.3* 3.6 3.6 3.6  CL 102 104 110 108 109  CO2 20* 21* 21* 22 23  GLUCOSE 160* 189* 109* 97 112*  BUN 23 19 12 10  7*  CREATININE 1.11* 0.96 0.70 0.71 0.84  CALCIUM  8.2* 7.7* 7.6* 8.0* 8.1*  MG  --   --  1.8 1.8 1.8  PHOS  --   --  2.5  --   --    Liver Function Tests: Recent Labs  Lab 06/10/24 0531 06/11/24 0749  AST 34 23  ALT 23 18  ALKPHOS 68 56  BILITOT 2.2* 1.4*  PROT 6.2* 5.5*  ALBUMIN  3.1* 2.6*   Recent Labs  Lab 06/10/24 0600  LIPASE 25   No results for input(s): AMMONIA in the last 168 hours. CBC: Recent Labs  Lab 06/10/24 1454 06/11/24 0749 06/12/24 0653 06/13/24 0722 06/14/24 0620  WBC 2.2* 2.8* 1.8* 2.2* 2.0*  NEUTROABS 1.5* 2.5  --   --   --   HGB 10.6* 10.7* 9.6* 9.5* 9.7*  HCT 31.2* 32.4* 29.1* 28.6* 29.8*  MCV 97.2 96.1 97.0 96.0 96.4  PLT 66* 67* 67* 83* 90*   Cardiac Enzymes: No results for input(s): CKTOTAL, CKMB, CKMBINDEX, TROPONINI in the last 168 hours. BNP: Invalid input(s): POCBNP CBG: Recent Labs  Lab 06/10/24 0537  GLUCAP 173*   D-Dimer No results for input(s): DDIMER in the last 72 hours. Hgb A1c No results for input(s): HGBA1C in the last 72 hours. Lipid Profile No results for input(s): CHOL, HDL, LDLCALC, TRIG, CHOLHDL, LDLDIRECT in the last 72 hours. Thyroid  function  studies No results for input(s): TSH, T4TOTAL, T3FREE, THYROIDAB in the last 72 hours.  Invalid input(s):  FREET3 Anemia work up No results for input(s): VITAMINB12, FOLATE, FERRITIN, TIBC, IRON, RETICCTPCT in the last 72 hours. Urinalysis    Component Value Date/Time   COLORURINE AMBER (A) 06/10/2024 0530   APPEARANCEUR HAZY (A) 06/10/2024 0530   LABSPEC 1.019 06/10/2024 0530   PHURINE 5.0 06/10/2024 0530   GLUCOSEU NEGATIVE 06/10/2024 0530   HGBUR SMALL (A) 06/10/2024 0530   BILIRUBINUR NEGATIVE 06/10/2024 0530   KETONESUR NEGATIVE 06/10/2024 0530   PROTEINUR 30 (A) 06/10/2024 0530   UROBILINOGEN 1.0 08/06/2012 2046   NITRITE NEGATIVE 06/10/2024 0530   LEUKOCYTESUR NEGATIVE 06/10/2024 0530   Sepsis Labs Recent Labs  Lab 06/11/24 0749 06/12/24 0653 06/13/24 0722 06/14/24 0620  WBC 2.8* 1.8* 2.2* 2.0*   Microbiology Recent Results (from the past 240 hours)  Resp panel by RT-PCR (RSV, Flu A&B, Covid) Anterior Nasal Swab     Status: None   Collection Time: 06/10/24  6:00 AM   Specimen: Anterior Nasal Swab  Result Value Ref Range Status   SARS Coronavirus 2 by RT PCR NEGATIVE NEGATIVE Final   Influenza A by PCR NEGATIVE NEGATIVE Final   Influenza B by PCR NEGATIVE NEGATIVE Final    Comment: (NOTE) The Xpert Xpress SARS-CoV-2/FLU/RSV plus assay is intended as an aid in the diagnosis of influenza from Nasopharyngeal swab specimens and should not be used as a sole basis for treatment. Nasal washings and aspirates are unacceptable for Xpert Xpress SARS-CoV-2/FLU/RSV testing.  Fact Sheet for Patients: BloggerCourse.com  Fact Sheet for Healthcare Providers: SeriousBroker.it  This test is not yet approved or cleared by the [place]  FDA and has been authorized for detection and/or diagnosis of SARS-CoV-2 by FDA under an Emergency Use Authorization (EUA). This EUA will remain in effect (meaning this test can be used) for the duration of the COVID-19 declaration under Section 564(b)(1) of the Act, 21  U.S.C. section 360bbb-3(b)(1), unless the authorization is terminated or revoked.     Resp Syncytial Virus by PCR NEGATIVE NEGATIVE Final    Comment: (NOTE) Fact Sheet for Patients: BloggerCourse.com  Fact Sheet for Healthcare Providers: SeriousBroker.it  This test is not yet approved or cleared by the [place]  FDA and has been authorized for detection and/or diagnosis of SARS-CoV-2 by FDA under an Emergency Use Authorization (EUA). This EUA will remain in effect (meaning this test can be used) for the duration of the COVID-19 declaration under Section 564(b)(1) of the Act, 21 U.S.C. section 360bbb-3(b)(1), unless the authorization is terminated or revoked.  Performed at [facility], [address], [place], [place] [address]   Blood culture (routine x 2)     Status: None (Preliminary result)   Collection Time: 06/10/24  6:47 AM   Specimen: BLOOD LEFT FOREARM  Result Value Ref Range Status   Specimen Description BLOOD LEFT FOREARM  Final   Special Requests   Final    BOTTLES DRAWN AEROBIC AND ANAEROBIC Blood Culture results may not be optimal due to an inadequate volume of blood received in culture bottles   Culture   Final    NO GROWTH 4 DAYS Performed at [facility], [address], [place], [place] [address]    Report Status PENDING  Incomplete  Blood culture (routine x 2)     Status: None (Preliminary result)   Collection Time: 06/10/24  6:52 AM   Specimen: BLOOD RIGHT ARM  Result  Value Ref Range Status   Specimen Description BLOOD RIGHT ARM  Final   Special Requests   Final    BOTTLES DRAWN AEROBIC ONLY Blood Culture results may not be optimal due to an inadequate volume of blood received in culture bottles   Culture   Final    NO GROWTH 4 DAYS Performed at [facility], [address] [place], [place] [address]    Report Status PENDING  Incomplete     Time coordinating discharge:  I  have spent 35 minutes face to face with the patient and on the ward discussing the patients care, assessment, plan and disposition with other care givers. >50% of the time was devoted counseling the patient about the risks and benefits of treatment/Discharge disposition and coordinating care.   SIGNED:   Burgess JAYSON Dare, MD  [facility] 06/14/2024, 11:31 AM   If 7PM-7AM, please contact night-coverage

## 2024-06-15 LAB — CULTURE, BLOOD (ROUTINE X 2)
Culture: NO GROWTH
Culture: NO GROWTH

## 2024-06-20 DIAGNOSIS — D61818 Other pancytopenia: Secondary | ICD-10-CM | POA: Diagnosis not present

## 2024-06-20 DIAGNOSIS — K6289 Other specified diseases of anus and rectum: Secondary | ICD-10-CM | POA: Diagnosis not present

## 2024-06-20 DIAGNOSIS — Z8619 Personal history of other infectious and parasitic diseases: Secondary | ICD-10-CM | POA: Diagnosis not present

## 2024-06-20 DIAGNOSIS — L03115 Cellulitis of right lower limb: Secondary | ICD-10-CM | POA: Diagnosis not present

## 2024-06-20 DIAGNOSIS — D469 Myelodysplastic syndrome, unspecified: Secondary | ICD-10-CM | POA: Diagnosis not present

## 2024-06-20 DIAGNOSIS — L0390 Cellulitis, unspecified: Secondary | ICD-10-CM | POA: Diagnosis not present

## 2024-06-20 DIAGNOSIS — E1129 Type 2 diabetes mellitus with other diabetic kidney complication: Secondary | ICD-10-CM | POA: Diagnosis not present

## 2024-07-05 ENCOUNTER — Other Ambulatory Visit: Payer: Self-pay

## 2024-07-05 ENCOUNTER — Emergency Department (HOSPITAL_COMMUNITY)

## 2024-07-05 ENCOUNTER — Encounter (HOSPITAL_COMMUNITY): Payer: Self-pay

## 2024-07-05 ENCOUNTER — Inpatient Hospital Stay (HOSPITAL_COMMUNITY)

## 2024-07-05 ENCOUNTER — Inpatient Hospital Stay (HOSPITAL_COMMUNITY)
Admission: EM | Admit: 2024-07-05 | Discharge: 2024-07-08 | DRG: 872 | Disposition: A | Discharge status: Home / Self Care | Attending: Internal Medicine | Admitting: Internal Medicine

## 2024-07-05 DIAGNOSIS — D469 Myelodysplastic syndrome, unspecified: Secondary | ICD-10-CM | POA: Diagnosis present

## 2024-07-05 DIAGNOSIS — E11621 Type 2 diabetes mellitus with foot ulcer: Secondary | ICD-10-CM | POA: Diagnosis present

## 2024-07-05 DIAGNOSIS — Z803 Family history of malignant neoplasm of breast: Secondary | ICD-10-CM

## 2024-07-05 DIAGNOSIS — Z96643 Presence of artificial hip joint, bilateral: Secondary | ICD-10-CM | POA: Diagnosis not present

## 2024-07-05 DIAGNOSIS — E66812 Obesity, class 2: Secondary | ICD-10-CM | POA: Diagnosis present

## 2024-07-05 DIAGNOSIS — M19071 Primary osteoarthritis, right ankle and foot: Secondary | ICD-10-CM | POA: Diagnosis not present

## 2024-07-05 DIAGNOSIS — I517 Cardiomegaly: Secondary | ICD-10-CM | POA: Diagnosis not present

## 2024-07-05 DIAGNOSIS — Z6835 Body mass index (BMI) 35.0-35.9, adult: Secondary | ICD-10-CM

## 2024-07-05 DIAGNOSIS — N179 Acute kidney failure, unspecified: Secondary | ICD-10-CM | POA: Diagnosis not present

## 2024-07-05 DIAGNOSIS — D61818 Other pancytopenia: Secondary | ICD-10-CM | POA: Diagnosis not present

## 2024-07-05 DIAGNOSIS — L03119 Cellulitis of unspecified part of limb: Secondary | ICD-10-CM | POA: Diagnosis not present

## 2024-07-05 DIAGNOSIS — Z7984 Long term (current) use of oral hypoglycemic drugs: Secondary | ICD-10-CM | POA: Diagnosis not present

## 2024-07-05 DIAGNOSIS — I480 Paroxysmal atrial fibrillation: Secondary | ICD-10-CM | POA: Diagnosis not present

## 2024-07-05 DIAGNOSIS — M7989 Other specified soft tissue disorders: Secondary | ICD-10-CM | POA: Diagnosis not present

## 2024-07-05 DIAGNOSIS — L270 Generalized skin eruption due to drugs and medicaments taken internally: Secondary | ICD-10-CM | POA: Diagnosis not present

## 2024-07-05 DIAGNOSIS — Z820 Family history of epilepsy and other diseases of the nervous system: Secondary | ICD-10-CM

## 2024-07-05 DIAGNOSIS — L0390 Cellulitis, unspecified: Secondary | ICD-10-CM | POA: Diagnosis not present

## 2024-07-05 DIAGNOSIS — F39 Unspecified mood [affective] disorder: Secondary | ICD-10-CM | POA: Diagnosis present

## 2024-07-05 DIAGNOSIS — Z87891 Personal history of nicotine dependence: Secondary | ICD-10-CM | POA: Diagnosis not present

## 2024-07-05 DIAGNOSIS — A419 Sepsis, unspecified organism: Secondary | ICD-10-CM | POA: Diagnosis not present

## 2024-07-05 DIAGNOSIS — Z79899 Other long term (current) drug therapy: Secondary | ICD-10-CM | POA: Diagnosis not present

## 2024-07-05 DIAGNOSIS — Z7901 Long term (current) use of anticoagulants: Secondary | ICD-10-CM | POA: Diagnosis not present

## 2024-07-05 DIAGNOSIS — I5031 Acute diastolic (congestive) heart failure: Secondary | ICD-10-CM | POA: Diagnosis not present

## 2024-07-05 DIAGNOSIS — R000 Tachycardia, unspecified: Secondary | ICD-10-CM | POA: Diagnosis not present

## 2024-07-05 DIAGNOSIS — Z881 Allergy status to other antibiotic agents status: Secondary | ICD-10-CM

## 2024-07-05 DIAGNOSIS — E7800 Pure hypercholesterolemia, unspecified: Secondary | ICD-10-CM | POA: Diagnosis present

## 2024-07-05 DIAGNOSIS — R112 Nausea with vomiting, unspecified: Secondary | ICD-10-CM | POA: Diagnosis present

## 2024-07-05 DIAGNOSIS — K219 Gastro-esophageal reflux disease without esophagitis: Secondary | ICD-10-CM | POA: Diagnosis not present

## 2024-07-05 DIAGNOSIS — Z96651 Presence of right artificial knee joint: Secondary | ICD-10-CM | POA: Diagnosis not present

## 2024-07-05 DIAGNOSIS — L03115 Cellulitis of right lower limb: Secondary | ICD-10-CM | POA: Diagnosis not present

## 2024-07-05 DIAGNOSIS — Z809 Family history of malignant neoplasm, unspecified: Secondary | ICD-10-CM

## 2024-07-05 DIAGNOSIS — R0602 Shortness of breath: Secondary | ICD-10-CM | POA: Diagnosis not present

## 2024-07-05 DIAGNOSIS — D696 Thrombocytopenia, unspecified: Secondary | ICD-10-CM

## 2024-07-05 DIAGNOSIS — E1140 Type 2 diabetes mellitus with diabetic neuropathy, unspecified: Secondary | ICD-10-CM | POA: Diagnosis present

## 2024-07-05 DIAGNOSIS — I10 Essential (primary) hypertension: Secondary | ICD-10-CM | POA: Diagnosis not present

## 2024-07-05 DIAGNOSIS — L97519 Non-pressure chronic ulcer of other part of right foot with unspecified severity: Secondary | ICD-10-CM | POA: Diagnosis present

## 2024-07-05 DIAGNOSIS — Z0389 Encounter for observation for other suspected diseases and conditions ruled out: Secondary | ICD-10-CM | POA: Diagnosis not present

## 2024-07-05 DIAGNOSIS — T361X5A Adverse effect of cephalosporins and other beta-lactam antibiotics, initial encounter: Secondary | ICD-10-CM | POA: Diagnosis not present

## 2024-07-05 DIAGNOSIS — M2011 Hallux valgus (acquired), right foot: Secondary | ICD-10-CM | POA: Diagnosis not present

## 2024-07-05 DIAGNOSIS — E669 Obesity, unspecified: Secondary | ICD-10-CM | POA: Diagnosis not present

## 2024-07-05 LAB — C-REACTIVE PROTEIN: CRP: 3.5 mg/dL — ABNORMAL HIGH (ref <1.0)

## 2024-07-05 LAB — I-STAT CG4 LACTIC ACID, ED: Lactic Acid, Venous: 4.5 mmol/L (ref 0.5–1.9)

## 2024-07-05 LAB — CBC
HCT: 38.4 % (ref 36.0–46.0)
Hemoglobin: 12.9 g/dL (ref 12.0–15.0)
MCH: 32.7 pg (ref 26.0–34.0)
MCHC: 33.6 g/dL (ref 30.0–36.0)
MCV: 97.2 fL (ref 80.0–100.0)
Platelets: 110 K/uL — ABNORMAL LOW (ref 150–400)
RBC: 3.95 MIL/uL (ref 3.87–5.11)
RDW: 15.7 % — ABNORMAL HIGH (ref 11.5–15.5)
WBC: 4.8 K/uL (ref 4.0–10.5)
nRBC: 0 % (ref 0.0–0.2)

## 2024-07-05 LAB — COMPREHENSIVE METABOLIC PANEL WITH GFR
ALT: 28 U/L (ref 0–44)
AST: 43 U/L — ABNORMAL HIGH (ref 15–41)
Albumin: 3.5 g/dL (ref 3.5–5.0)
Alkaline Phosphatase: 68 U/L (ref 38–126)
Anion gap: 14 (ref 5–15)
BUN: 19 mg/dL (ref 8–23)
CO2: 22 mmol/L (ref 22–32)
Calcium: 8.7 mg/dL — ABNORMAL LOW (ref 8.9–10.3)
Chloride: 101 mmol/L (ref 98–111)
Creatinine, Ser: 1.33 mg/dL — ABNORMAL HIGH (ref 0.44–1.00)
GFR, Estimated: 41 mL/min — ABNORMAL LOW (ref >60)
Glucose, Bld: 184 mg/dL — ABNORMAL HIGH (ref 70–99)
Potassium: 3.6 mmol/L (ref 3.5–5.1)
Sodium: 137 mmol/L (ref 135–145)
Total Bilirubin: 2.4 mg/dL — ABNORMAL HIGH (ref 0.0–1.2)
Total Protein: 6.9 g/dL (ref 6.5–8.1)

## 2024-07-05 LAB — SEDIMENTATION RATE: Sed Rate: 15 mm/h (ref 0–22)

## 2024-07-05 LAB — LIPASE, BLOOD: Lipase: 28 U/L (ref 11–51)

## 2024-07-05 MED ORDER — VANCOMYCIN HCL 1500 MG/300ML IV SOLN
1500.0000 mg | Freq: Once | INTRAVENOUS | Status: DC
  Filled 2024-07-05: qty 300

## 2024-07-05 MED ORDER — LACTATED RINGERS IV SOLN: INTRAVENOUS | Status: AC

## 2024-07-05 MED ORDER — ACETAMINOPHEN 500 MG PO TABS: 1000.0000 mg | ORAL_TABLET | Freq: Four times a day (QID) | ORAL | Status: DC | PRN

## 2024-07-05 MED ORDER — ACETAMINOPHEN 325 MG PO TABS
975.0000 mg | ORAL_TABLET | Freq: Once | ORAL | Status: AC
  Administered 2024-07-05: 975 mg via ORAL
  Filled 2024-07-05: qty 3

## 2024-07-05 MED ORDER — ESCITALOPRAM OXALATE 10 MG PO TABS
20.0000 mg | ORAL_TABLET | Freq: Every day | ORAL | Status: DC
  Administered 2024-07-06 – 2024-07-07 (×2): 20 mg via ORAL
  Filled 2024-07-05 (×3): qty 2

## 2024-07-05 MED ORDER — CLINDAMYCIN PHOSPHATE 600 MG/50ML IV SOLN
600.0000 mg | Freq: Once | INTRAVENOUS | Status: AC
  Administered 2024-07-05: 600 mg via INTRAVENOUS
  Filled 2024-07-05: qty 50

## 2024-07-05 MED ORDER — SODIUM CHLORIDE 0.9 % IV SOLN
2.0000 g | Freq: Two times a day (BID) | INTRAVENOUS | Status: DC
  Administered 2024-07-06: 2 g via INTRAVENOUS
  Filled 2024-07-05: qty 12.5

## 2024-07-05 MED ORDER — LACTATED RINGERS IV BOLUS
500.0000 mL | Freq: Once | INTRAVENOUS | Status: AC
  Administered 2024-07-05: 500 mL via INTRAVENOUS

## 2024-07-05 MED ORDER — METOPROLOL SUCCINATE ER 25 MG PO TB24
12.5000 mg | ORAL_TABLET | Freq: Every day | ORAL | Status: DC
  Administered 2024-07-05 – 2024-07-07 (×3): 12.5 mg via ORAL
  Filled 2024-07-05 (×3): qty 1

## 2024-07-05 MED ORDER — PANTOPRAZOLE SODIUM 40 MG PO TBEC
40.0000 mg | DELAYED_RELEASE_TABLET | Freq: Every day | ORAL | Status: DC
  Administered 2024-07-06 – 2024-07-08 (×3): 40 mg via ORAL
  Filled 2024-07-05 (×3): qty 1

## 2024-07-05 MED ORDER — GABAPENTIN 300 MG PO CAPS
300.0000 mg | ORAL_CAPSULE | Freq: Two times a day (BID) | ORAL | Status: DC
  Administered 2024-07-05 – 2024-07-08 (×6): 300 mg via ORAL
  Filled 2024-07-05 (×6): qty 1

## 2024-07-05 MED ORDER — SODIUM CHLORIDE 0.9 % IV SOLN
2.0000 g | Freq: Once | INTRAVENOUS | Status: AC
  Administered 2024-07-05: 2 g via INTRAVENOUS
  Filled 2024-07-05: qty 12.5

## 2024-07-05 MED ORDER — SODIUM CHLORIDE 0.9% FLUSH
3.0000 mL | Freq: Two times a day (BID) | INTRAVENOUS | Status: DC
  Administered 2024-07-05 – 2024-07-08 (×5): 3 mL via INTRAVENOUS

## 2024-07-05 MED ORDER — VANCOMYCIN HCL IN DEXTROSE 1-5 GM/200ML-% IV SOLN: 1000.0000 mg | Freq: Once | INTRAVENOUS | Status: DC

## 2024-07-05 MED ORDER — LINEZOLID 600 MG/300ML IV SOLN
600.0000 mg | Freq: Two times a day (BID) | INTRAVENOUS | Status: DC
  Filled 2024-07-05: qty 300

## 2024-07-05 MED ORDER — MELATONIN 3 MG PO TABS: 6.0000 mg | ORAL_TABLET | Freq: Every evening | ORAL | Status: DC | PRN

## 2024-07-05 MED ORDER — POLYETHYLENE GLYCOL 3350 17 G PO PACK: 17.0000 g | PACK | Freq: Every day | ORAL | Status: DC | PRN

## 2024-07-05 MED ORDER — ONDANSETRON HCL 4 MG/2ML IJ SOLN: 4.0000 mg | Freq: Four times a day (QID) | INTRAMUSCULAR | Status: DC | PRN

## 2024-07-05 MED ORDER — HEPARIN SODIUM (PORCINE) 5000 UNIT/ML IJ SOLN
5000.0000 [IU] | Freq: Three times a day (TID) | INTRAMUSCULAR | Status: DC
  Administered 2024-07-05 – 2024-07-08 (×8): 5000 [IU] via SUBCUTANEOUS
  Filled 2024-07-05 (×8): qty 1

## 2024-07-05 MED ORDER — LACTATED RINGERS IV BOLUS
1000.0000 mL | Freq: Once | INTRAVENOUS | Status: AC
  Administered 2024-07-05: 1000 mL via INTRAVENOUS

## 2024-07-05 MED ORDER — ROSUVASTATIN CALCIUM 5 MG PO TABS
5.0000 mg | ORAL_TABLET | Freq: Every day | ORAL | Status: DC
  Administered 2024-07-06 – 2024-07-07 (×2): 5 mg via ORAL
  Filled 2024-07-05 (×3): qty 1

## 2024-07-05 MED ORDER — ALBUTEROL SULFATE (2.5 MG/3ML) 0.083% IN NEBU: 2.5000 mg | INHALATION_SOLUTION | RESPIRATORY_TRACT | Status: DC | PRN

## 2024-07-05 MED ORDER — CLINDAMYCIN PHOSPHATE 600 MG/50ML IV SOLN
600.0000 mg | Freq: Three times a day (TID) | INTRAVENOUS | Status: DC
  Administered 2024-07-06: 600 mg via INTRAVENOUS
  Filled 2024-07-05 (×3): qty 50

## 2024-07-05 NOTE — ED Triage Notes (Signed)
 Patient reports vomiting x 2 hrs, toe infection is spreading up her leg and managed by another doctor. Patient has emesis dried on her face and chest at this time.

## 2024-07-05 NOTE — ED Notes (Signed)
 Attempted to obtain 2nd BC was unsuccessful

## 2024-07-05 NOTE — ED Notes (Signed)
 Pt states she has hx of red man syndrome and hives with Vanc abx. No hx in her allergy chart. MD informed. Med held to speak with pharm

## 2024-07-05 NOTE — H&P (Signed)
 History and Physical    Kathleen Wiley FMW:[id number] DOB: 1946/09/27 DOA: 07/05/2024  PCP: Dyane Anthony RAMAN, FNP   Patient coming from: Home   Chief Complaint:  Chief Complaint  Patient presents with   Emesis   Leg Pain    HPI:  Kathleen Wiley is a 78 y.o. female with hx of MDS with pancytopenia, paroxysmal Afib not on AC, hypertension, hyperlipidemia, diabetes type 2  recent admission 8/3 - 7 with sepsis secondary to cellulitis of the RLE treated with CTX inpatient and discharged on Cefadroxil  to complete 10 day course. Returns with worsening swelling and redness of the RLE x 2 days.  Noted bruised appearance along with the other changes. Also today with rigors. And had episode of nausea and vomiting. Denies other abdominal pain, constipation, change in chronic diarrhea (attributes to prior cholecystectomy). Denies other recent illness.   Review of Systems:  ROS complete and negative except as marked above   Allergies  Allergen Reactions   Other     Refuses whole blood but does accept albumin    Ditropan  [Oxybutynin ] Other (See Comments)    Unknown reaction   Flagyl [Metronidazole] Other (See Comments)    Chest pain Induced A-fib    Lipitor [Atorvastatin] Other (See Comments)    Unknown reaction   Septra [Sulfamethoxazole-Trimethoprim] Rash    Blistering rash    Vancomycin  Itching    Prior to Admission medications   Medication Sig Start Date End Date Taking? Authorizing Provider  Cholecalciferol  (VITAMIN D-3 PO) Take 1 capsule by mouth daily.   Yes [provider]  Cyanocobalamin  (VITAMIN B-12 PO) Take 1 tablet by mouth daily.   Yes [provider]  escitalopram  (LEXAPRO ) 20 MG tablet Take 20 mg by mouth at bedtime.   Yes [provider]  furosemide  (LASIX ) 20 MG tablet Take 20 mg by mouth daily. 09/23/19  Yes [provider]  gabapentin  (NEURONTIN ) 300 MG capsule Take 300 mg by mouth 2 (two) times daily. 07/25/19  Yes [provider]  metoprolol  succinate (TOPROL  XL) 25 MG 24 hr tablet Take 0.5 tablets (12.5 mg total) by mouth daily. Patient taking differently: Take 25 mg by mouth at bedtime. 05/06/22  Yes Vann, Jessica U, DO  omeprazole (PRILOSEC) 20 MG capsule Take 20 mg by mouth daily.   Yes [provider]  potassium chloride  (KLOR-CON  M) 10 MEQ tablet Take 10 mEq by mouth daily. 07/02/24  Yes [provider]  potassium chloride  (KLOR-CON ) 10 MEQ tablet Take 10 mEq by mouth daily.   Yes [provider]  rosuvastatin  (CRESTOR ) 5 MG tablet Take 5 mg by mouth at bedtime.   Yes [provider]    Past Medical History:  Diagnosis Date   Anxiety    Arthritis    knee, hip   Cellulitis 08/2016   left leg   Complication of anesthesia    Diabetes mellitus without complication (HCC)    GERD (gastroesophageal reflux disease)    Heart murmur    slight   informed years ago.-    Hernia, umbilical    History of atrial fibrillation 2007   with flagyl   History of endometriosis    Hypercholesteremia    Hypertension    has not been to a cardiologist   PONV (postoperative nausea and vomiting)    Rectal vaginal fistula    Refusal of blood transfusions as patient is Jehovah's Witness    Sleep apnea     Past Surgical History:  Procedure Laterality Date   APPENDECTOMY     BUNIONECTOMY Right 01/09/2019   Procedure: RIGHT FOOT BUNION CORRECTION;  Surgeon: Elsa Lonni SAUNDERS, MD;  Location: [facility] OR;  Service: Orthopedics;  Laterality: Right;   CHOLECYSTECTOMY N/A 03/16/2021   Procedure: LAPAROSCOPIC CHOLECYSTECTOMY;  Surgeon: Aron Shoulders, MD;  Location: [facility];  Service: General;  Laterality: N/A;   ENDOSCOPIC RETROGRADE CHOLANGIOPANCREATOGRAPHY (ERCP) WITH PROPOFOL  N/A 03/15/2021   Procedure: ENDOSCOPIC RETROGRADE CHOLANGIOPANCREATOGRAPHY (ERCP) WITH PROPOFOL ;  Surgeon: Saintclair Jasper, MD;  Location: [facility];  Service: Gastroenterology;  Laterality: N/A;   EXPLORATORY  LAPAROTOMY  1973   for endometrosis   HAMMERTOE RECONSTRUCTION WITH WEIL OSTEOTOMY Right 01/09/2019   Procedure: RIGHT FOOT 2-4 HAMMERTOE CORRECTION WITH WEIL OSTEOTOMY 2ND METATARSAL, DEEP TENDON TRANSFER WITH AKIN OSTEOTOMY;  Surgeon: Elsa Lonni SAUNDERS, MD;  Location: [facility] OR;  Service: Orthopedics;  Laterality: Right;   I & D EXTREMITY Right 06/10/2016   Procedure: IRRIGATION AND DEBRIDEMENT THUMB FLEXOR SHEATH;  Surgeon: Franky Curia, MD;  Location: [facility] OR;  Service: Orthopedics;  Laterality: Right;   I & D EXTREMITY Right 04/23/2022   Procedure: RIGHT LEG DEBRIDEMENT;  Surgeon: Harden Jerona GAILS, MD;  Location: [facility] OR;  Service: Orthopedics;  Laterality: Right;   JOINT REPLACEMENT     Left Hip   MANDIBLE SURGERY  1978   rectovaginal fistula repair  1980   REMOVAL OF STONES  03/15/2021   Procedure: REMOVAL OF STONES;  Surgeon: Saintclair Jasper, MD;  Location: [facility];  Service: Gastroenterology;;   ANNETT  03/15/2021   Procedure: ANNETT;  Surgeon: Saintclair Jasper, MD;  Location: [facility];  Service: Gastroenterology;;   TOTAL HIP ARTHROPLASTY  05/08/2012   Procedure: TOTAL HIP ARTHROPLASTY;  Surgeon: Dempsey JINNY Sensor, MD;  Location: [facility] OR;  Service: Orthopedics;  Laterality: Left;   TOTAL HIP ARTHROPLASTY Right 09/18/2018   Procedure: TOTAL HIP ARTHROPLASTY ANTERIOR APPROACH;  Surgeon: Sensor Dempsey, MD;  Location: [facility];  Service: Orthopedics;  Laterality: Right;   TOTAL KNEE ARTHROPLASTY Right 01/24/2013   Dr Sensor   TOTAL KNEE ARTHROPLASTY Right 01/24/2013   Procedure: RIGHT TOTAL KNEE ARTHROPLASTY;  Surgeon: Dempsey JINNY Sensor, MD;  Location: [facility] OR;  Service: Orthopedics;  Laterality: Right;     reports that she quit smoking about 42 years ago. Her smoking use included cigarettes. She started smoking about 52 years ago. She has a 10 pack-year smoking history. She has never used smokeless tobacco. She reports that she does not drink alcohol and does not use drugs.  Family History  Problem  Relation Age of Onset   Dementia Mother    Cancer Father    Breast cancer Neg Hx      Physical Exam: Vitals:   07/05/24 1814 07/05/24 1900 07/05/24 1915 07/05/24 2045  BP:  114/63 95/63   Pulse:  95 95 89  Resp:  (!) 23 (!) 32 (!) 21  Temp: (!) 101.3 F (38.5 C)     TempSrc: Oral     SpO2:  95% 95% 96%  Weight:      Height:        Gen: Awake, alert, NAD   CV: Regular, normal S1, S2, no murmurs  Resp: Normal WOB, CTAB  Abd: mildly distended, soft, normoactive, nontender MSK: Symmetric, no edema  Skin: The RLE has petechial and purpural rash in the lower leg, and increased warmth, erythema, which extends above the knee. The distal 1st toe has dry ulceration as does the plantar aspect of the R  2nd toe. No significant skin changes around these ulcerations.  Neuro: Alert and interactive  Psych: euthymic, appropriate    Data review:   Labs reviewed, notable for:   Creatinine baseline around 0.7, elevated to 1.33 Lactate 4.5 CRP 3.5, ESR 15 T. bili 2.4, AST 43, other LFT within normal limit WBC 4.8, hemoglobin 12, platelet 110 all up from prior; likely hemoconcentrated Recent A1c 5.2%  Micro:  Results for orders placed or performed during the hospital encounter of 06/10/24  Resp panel by RT-PCR (RSV, Flu A&B, Covid) Anterior Nasal Swab     Status: None   Collection Time: 06/10/24  6:00 AM   Specimen: Anterior Nasal Swab  Result Value Ref Range Status   SARS Coronavirus 2 by RT PCR NEGATIVE NEGATIVE Final   Influenza A by PCR NEGATIVE NEGATIVE Final   Influenza B by PCR NEGATIVE NEGATIVE Final    Comment: (NOTE) The Xpert Xpress SARS-CoV-2/FLU/RSV plus assay is intended as an aid in the diagnosis of influenza from Nasopharyngeal swab specimens and should not be used as a sole basis for treatment. Nasal washings and aspirates are unacceptable for Xpert Xpress SARS-CoV-2/FLU/RSV testing.  Fact Sheet for Patients: BloggerCourse.com  Fact Sheet  for Healthcare Providers: SeriousBroker.it  This test is not yet approved or cleared by the [place]  FDA and has been authorized for detection and/or diagnosis of SARS-CoV-2 by FDA under an Emergency Use Authorization (EUA). This EUA will remain in effect (meaning this test can be used) for the duration of the COVID-19 declaration under Section 564(b)(1) of the Act, 21 U.S.C. section 360bbb-3(b)(1), unless the authorization is terminated or revoked.     Resp Syncytial Virus by PCR NEGATIVE NEGATIVE Final    Comment: (NOTE) Fact Sheet for Patients: BloggerCourse.com  Fact Sheet for Healthcare Providers: SeriousBroker.it  This test is not yet approved or cleared by the [place]  FDA and has been authorized for detection and/or diagnosis of SARS-CoV-2 by FDA under an Emergency Use Authorization (EUA). This EUA will remain in effect (meaning this test can be used) for the duration of the COVID-19 declaration under Section 564(b)(1) of the Act, 21 U.S.C. section 360bbb-3(b)(1), unless the authorization is terminated or revoked.  Performed at [facility], [address], [place], [place] [address]   Blood culture (routine x 2)     Status: None   Collection Time: 06/10/24  6:47 AM   Specimen: BLOOD LEFT FOREARM  Result Value Ref Range Status   Specimen Description BLOOD LEFT FOREARM  Final   Special Requests   Final    BOTTLES DRAWN AEROBIC AND ANAEROBIC Blood Culture results may not be optimal due to an inadequate volume of blood received in culture bottles   Culture   Final    NO GROWTH 5 DAYS Performed at [facility], [address], [place], [place] [address]    Report Status 06/15/2024 FINAL  Final  Blood culture (routine x 2)     Status: None   Collection Time: 06/10/24  6:52 AM   Specimen: BLOOD RIGHT ARM  Result Value Ref Range Status   Specimen Description BLOOD RIGHT ARM   Final   Special Requests   Final    BOTTLES DRAWN AEROBIC ONLY Blood Culture results may not be optimal due to an inadequate volume of blood received in culture bottles   Culture   Final    NO GROWTH 5 DAYS Performed at [facility], [address] [place], [place]  [address]    Report Status 06/15/2024 FINAL  Final    Imaging reviewed:  DG Foot Complete Right Result Date: 07/05/2024 CLINICAL DATA:  ?  Osteomyelitis of the right 2nd toe EXAM: RIGHT FOOT COMPLETE - 3+ VIEW COMPARISON:  May 06, 2023 FINDINGS: Redemonstrated severe hallux valgus deformity of the first digit. Postsurgical changes of the first and second metatarsals and the proximal phalanx of the great toe. No acute fracture or dislocation. Bony ankylosis across the 2nd-4th proximal interphalangeal joints. Soft tissue swelling about the second digit with a small amount of subcutaneous gas along the plantar surface. Moderate degenerative changes of the midfoot. IMPRESSION: Soft tissue swelling about the second digit with a small amount of subcutaneous gas along the plantar surface, possibly due to gas producing soft tissue infection or ulceration. No radiographic findings of osteomyelitis, at this time. Electronically Signed   By: Rogelia Myers M.D.   On: 07/05/2024 19:18   DG Chest Portable 1 View Result Date: 07/05/2024 CLINICAL DATA:  Shortness of breath EXAM: PORTABLE CHEST 1 VIEW COMPARISON:  Chest radiograph June 10, 2024 FINDINGS: Mildly enlarged enlarged cardiomediastinal silhouette. Both lungs are clear. No new consolidation. No pleural effusion or pneumothorax. The visualized skeletal structures are unremarkable. IMPRESSION: Stable mild cardiomegaly. No new nodule. Electronically Signed   By: Megan  Zare M.D.   On: 07/05/2024 19:15    EKG:  Personally reviewed, sinus rhythm, LAD, incomplete right bundle branch block, slight ST depression laterally, T wave inversion inferiorly  ED Course:  Treated with vancomycin ,  cefepime , clindamycin , 1 L LR, Tylenol    Assessment/Plan:  78 y.o. female with hx MDS with pancytopenia, paroxysmal Afib not on AC, hypertension, hyperlipidemia, diabetes type 2 recent admission 8/3 - 7 with sepsis secondary to cellulitis of the RLE treated with CTX inpatient and discharged on Cefadroxil  to complete 10 day course. Returns with relapsed cellulitis and recurrent sepsis.   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  WBC relatively elevated from her baseline around 2; history of pancytopenia.  Lactate 4.5. CRP 3.5, ESR 15.  X-ray of the right foot with questionable foci of soft tissue gas plantar aspect of R 2nd digit although clinically does not appear to have gas forming infection, toe with minimal inflammatory changes suspect this is related to ulceration, does have hardware present in the foot. Does have petechial and purpural changes around the leg, with no crepitus. Had a similar appearance last admission (although worse currently), feel this is related to her cytopenia and inflammatory changes along with infection.  -Continue on Clindamycin  600 mg IV q 8 hr (for MRSA coverage mainly; hx VIR and cytopenia limiting use of alternates, could consider Daptomycin if needed), and cefepime  2 g IV q 12 hr. OK to hold off on additional Clindamycin  for now  -S/p 1 L LR, given additional 1 L then repeat lactate, continue on maintenance rate 100 cc an hour -Follow-up blood cultures -- XR of R tib fib eval for soft tissue gas  -- Serial exam of the lower extremity, if any rapid changes will need expedited surgical evaluation  -- Otherwise routine orthopedic surgery consult in AM for evaluation of chronic DM foot ulcerations and presence of hardware with recurrent infections in the RLE (not contacted overnight).   AKI stage  I Baseline creatinine  around 0.7, elevated to 1.33.  Prerenal in the setting of sepsis - IV fluids per above -- Hold home lasix    Elevated T. Bili Trend LFT, suspect in setting of sepsis.  Chronic medical problems: MDS, pancytopenia: Appears hemoconcentrated on admission, anticipate drop in cell counts. Recent baseline near WBC 2-3, hemoglobin 9-10, platelet 60-90.  Paroxysmal A-fib: Not on anticoagulation, reduce metoprolol  to 12.5 mg bedtime with borderline BP  Hypertension /LE edema: Hold home Lasix  Hyperlipidemia: Continue home rosuvastatin  Diabetes type 2/neuropathy: Holding home metformin.  SSI for very sensitive.  Continue home gabapentin  GERD: Sub home PPI Mood disorder: Continue home escitalopram   Body mass index is 35.35 kg/m.  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 Inpatient status is judged to be reasonable and necessary in order to provide the required intensity of service to ensure the patient's safety. The patient's presenting symptoms, physical exam findings, and initial radiographic and laboratory data in the context of their chronic comorbidities is felt to place them at high risk for further clinical deterioration. Furthermore, it is not anticipated that the patient will be medically stable for discharge from the hospital within 2 midnights of admission.   * I certify that at the point of admission it is my clinical judgment that the patient will require inpatient hospital care spanning beyond 2 midnights from the point of admission due to high intensity  of service, high risk for further deterioration and high frequency of surveillance required.*   Dorn Dawson, MD [facility]  How to contact the [facility] Attending or Consulting provider 7A - 7P or covering provider during after hours 7P -7A, for this patient.  Check the care team in [facility] and look for a) attending/consulting [facility] provider listed and b) the [facility] team listed Log into [email or web address] and use [place]'s universal password to access. If you do not have the password, please contact the hospital operator. Locate the [facility] provider you are looking for under [facility] and page to a number that you can be directly reached. If you still have difficulty reaching the provider, please page the [facility] (Director on Call) for the Hospitalists listed on amion for assistance.  07/05/2024, 8:54 PM

## 2024-07-05 NOTE — ED Triage Notes (Signed)
 C/O SHOB. Denies CP.

## 2024-07-05 NOTE — ED Provider Notes (Signed)
 [place] EMERGENCY DEPARTMENT AT [facility] Provider Note   CSN: [id number] Arrival date & time: 07/05/24  1644     Patient presents with: Emesis and Leg Pain   Kathleen Wiley is a 78 y.o. female.  With a history of diabetes, myelodysplastic syndrome, atrial fibrillation on Eliquis and recurrent cellulitis who presents to the ED for right lower extremity infection.  Recent admission for right lower extremity cellulitis leading to sepsis on August 7.  Returns for concern of right lower extremity infection.  Source is suspected to be the right second toe.  Increasing redness, warmth extending up through pretibial region she first noticed today.  Subjective fevers.  No inciting trauma.  No other infectious symptoms.  She is currently on Keflex  at home    Emesis Leg Pain      Prior to Admission medications   Medication Sig Start Date End Date Taking? Authorizing Provider  Cholecalciferol  (VITAMIN D-3 PO) Take 1 capsule by mouth daily.    [provider]  Cyanocobalamin  (VITAMIN B-12 PO) Take 1 tablet by mouth daily.    [provider]  escitalopram  (LEXAPRO ) 20 MG tablet Take 20 mg by mouth at bedtime.    [provider]  furosemide  (LASIX ) 20 MG tablet Take 20 mg by mouth daily. 09/23/19   [provider]  gabapentin  (NEURONTIN ) 300 MG capsule Take 300 mg by mouth 2 (two) times daily. 07/25/19   [provider]  metoprolol  succinate (TOPROL  XL) 25 MG 24 hr tablet Take 0.5 tablets (12.5 mg total) by mouth daily. Patient taking differently: Take 25 mg by mouth at bedtime. 05/06/22   Vann, Jessica U, DO  omeprazole (PRILOSEC) 20 MG capsule Take 20 mg by mouth daily.    [provider]  potassium chloride  (KLOR-CON ) 10 MEQ tablet Take 10 mEq by mouth daily.    [provider]  rosuvastatin  (CRESTOR ) 5 MG tablet Take 5 mg by mouth at bedtime.    [provider]    Allergies: Other, Ditropan  [oxybutynin ],  Flagyl [metronidazole], Lipitor [atorvastatin], and Septra [sulfamethoxazole-trimethoprim]    Review of Systems  Gastrointestinal:  Positive for vomiting.    Updated Vital Signs BP 119/66   Pulse 99   Temp (!) 101.3 F (38.5 C) (Oral)   Resp (!) 25   Ht 5' 6 (1.676 m)   Wt 99.3 kg   SpO2 95%   BMI 35.35 kg/m   Physical Exam Vitals and nursing note reviewed.  HENT:     Head: Normocephalic and atraumatic.  Eyes:     Pupils: Pupils are equal, round, and reactive to light.  Cardiovascular:     Rate and Rhythm: Normal rate and regular rhythm.  Pulmonary:     Effort: Pulmonary effort is normal.     Breath sounds: Normal breath sounds.  Abdominal:     Palpations: Abdomen is soft.     Tenderness: There is no abdominal tenderness.  Musculoskeletal:     Comments: Small wound without purulent drainage over plantar aspect of distal right second toe Erythema increased warmth extending up through pretibial region of right lower extremity as pictured below 2+ DP pulse bilaterally  Skin:    General: Skin is warm and dry.  Neurological:     Mental Status: She is alert.  Psychiatric:        Mood and Affect: Mood normal.        (all labs ordered are listed, but only abnormal results are displayed) Labs Reviewed  CBC - Abnormal; Notable for the following components:      Result Value   RDW 15.7 (*)    Platelets 110 (*)    All other components within normal limits  C-REACTIVE PROTEIN - Abnormal; Notable for the following components:   CRP 3.5 (*)    All other components within normal limits  COMPREHENSIVE METABOLIC PANEL WITH GFR - Abnormal; Notable for the following components:   Glucose, Bld 184 (*)    Creatinine, Ser 1.33 (*)    Calcium  8.7 (*)    AST 43 (*)    Total Bilirubin 2.4 (*)    GFR, Estimated 41 (*)    All other components within normal limits  I-STAT CG4 LACTIC ACID, ED - Abnormal; Notable for the following components:   Lactic Acid, Venous 4.5 (*)    All  other components within normal limits  CULTURE, BLOOD (ROUTINE X 2)  CULTURE, BLOOD (ROUTINE X 2)  LIPASE, BLOOD  URINALYSIS, ROUTINE W REFLEX MICROSCOPIC  SEDIMENTATION RATE  I-STAT CG4 LACTIC ACID, ED    EKG: None  Radiology: DG Foot Complete Right Result Date: 07/05/2024 CLINICAL DATA:  ?  Osteomyelitis of the right 2nd toe EXAM: RIGHT FOOT COMPLETE - 3+ VIEW COMPARISON:  May 06, 2023 FINDINGS: Redemonstrated severe hallux valgus deformity of the first digit. Postsurgical changes of the first and second metatarsals and the proximal phalanx of the great toe. No acute fracture or dislocation. Bony ankylosis across the 2nd-4th proximal interphalangeal joints. Soft tissue swelling about the second digit with a small amount of subcutaneous gas along the plantar surface. Moderate degenerative changes of the midfoot. IMPRESSION: Soft tissue swelling about the second digit with a small amount of subcutaneous gas along the plantar surface, possibly due to gas producing soft tissue infection or ulceration. No radiographic findings of osteomyelitis, at this time. Electronically Signed   By: Rogelia Myers M.D.   On: 07/05/2024 19:18   DG Chest Portable 1 View Result Date: 07/05/2024 CLINICAL DATA:  Shortness of breath EXAM: PORTABLE CHEST 1 VIEW COMPARISON:  Chest radiograph June 10, 2024 FINDINGS: Mildly enlarged enlarged cardiomediastinal silhouette. Both lungs are clear. No new consolidation. No pleural effusion or pneumothorax. The visualized skeletal structures are unremarkable. IMPRESSION: Stable mild cardiomegaly. No new nodule. Electronically Signed   By: Megan  Zare M.D.   On: 07/05/2024 19:15     Procedures   Medications Ordered in the ED  vancomycin  (VANCOREADY) IVPB 1500 mg/300 mL (has no administration in time range)  clindamycin  (CLEOCIN ) IVPB 600 mg (has no administration in time range)  ceFEPIme  (MAXIPIME ) 2 g in sodium chloride  0.9 % 100 mL IVPB (2 g Intravenous New Bag/Given  07/05/24 1848)  acetaminophen  (TYLENOL ) tablet 975 mg (975 mg Oral Given 07/05/24 1848)  lactated ringers  bolus 1,000 mL (1,000 mLs Intravenous New Bag/Given 07/05/24 1847)                                    Medical Decision Making 78 year old female with history as above presented to ED for right lower extremity infection.  Febrile tachypneic elevated venous lactic.  Consistent with sepsis.  Will cover with broad-spectrum antibiotics for suspected soft tissue source.  Will obtain inflammatory markers and x-ray to look for evidence of osteomyelitis.  Will provide IV fluids for rehydration.  Hemodynamically stable upon my initial assessment.  She is currently on Keflex .  Will require readmission  Amount and/or  Complexity of Data Reviewed Labs: ordered. Radiology: ordered.  Risk OTC drugs. Prescription drug management.       Final diagnoses:  Sepsis, due to unspecified organism, unspecified whether acute organ dysfunction present [facility])  Cellulitis of right lower extremity    ED Discharge Orders     None          Pamella Ozell LABOR, DO 07/05/24 1932

## 2024-07-05 NOTE — ED Provider Triage Note (Signed)
 Emergency Medicine Provider Triage Evaluation Note  Kathleen Wiley , a 78 y.o. female  was evaluated in triage.  Pt complains of right leg skin infection, nausea, vomiting, shortness of breath.  In triage patient has dried vomit all over her face as well as chest and stomach, right leg looks very cellulitic with surrounding red/purple color changes.  Denies fever, chills, chest pain, abdominal pain.  States she is not really sure why she threw up, she was just laying down in bed and threw up all over herself.  Review of Systems  Positive: Vomiting, nausea, cellulitis, right leg pain Negative: Fever, chills, chest pain  Physical Exam  BP 102/60   Pulse (!) 109   Temp 98.7 F (37.1 C)   Resp 18   Ht 5' 6 (1.676 m)   Wt 99.3 kg   SpO2 91%   BMI 35.35 kg/m  Gen:   Awake, no distress, ill-appearing, dried vomit present on face as well as clothing Resp:  Normal effort  MSK:   Moves extremities without difficulty  Other:  Right lower leg extremely cellulitic, warm to touch, red/purple color change present, distal DP pulse 2+  Medical Decision Making  Medically screening exam initiated at 5:48 PM.  Appropriate orders placed.  Kathleen Wiley was informed that the remainder of the evaluation will be completed by another provider, this initial triage assessment does not replace that evaluation, and the importance of remaining in the ED until their evaluation is complete.  Orders: CBC, CMP, lipase, UA, lactic acid, blood cultures, chest x-ray, EKG   Kathleen Wiley, [place] 07/05/24 1750

## 2024-07-06 DIAGNOSIS — L03119 Cellulitis of unspecified part of limb: Secondary | ICD-10-CM | POA: Diagnosis not present

## 2024-07-06 DIAGNOSIS — D696 Thrombocytopenia, unspecified: Secondary | ICD-10-CM

## 2024-07-06 DIAGNOSIS — E669 Obesity, unspecified: Secondary | ICD-10-CM

## 2024-07-06 DIAGNOSIS — L270 Generalized skin eruption due to drugs and medicaments taken internally: Secondary | ICD-10-CM

## 2024-07-06 DIAGNOSIS — D469 Myelodysplastic syndrome, unspecified: Secondary | ICD-10-CM

## 2024-07-06 LAB — CBC
HCT: 32.1 % — ABNORMAL LOW (ref 36.0–46.0)
Hemoglobin: 10.6 g/dL — ABNORMAL LOW (ref 12.0–15.0)
MCH: 32.5 pg (ref 26.0–34.0)
MCHC: 33 g/dL (ref 30.0–36.0)
MCV: 98.5 fL (ref 80.0–100.0)
Platelets: 82 K/uL — ABNORMAL LOW (ref 150–400)
RBC: 3.26 MIL/uL — ABNORMAL LOW (ref 3.87–5.11)
RDW: 16.1 % — ABNORMAL HIGH (ref 11.5–15.5)
WBC: 3.5 K/uL — ABNORMAL LOW (ref 4.0–10.5)
nRBC: 0 % (ref 0.0–0.2)

## 2024-07-06 LAB — HEPATIC FUNCTION PANEL
ALT: 21 U/L (ref 0–44)
AST: 28 U/L (ref 15–41)
Albumin: 2.7 g/dL — ABNORMAL LOW (ref 3.5–5.0)
Alkaline Phosphatase: 53 U/L (ref 38–126)
Bilirubin, Direct: 0.6 mg/dL — ABNORMAL HIGH (ref 0.0–0.2)
Indirect Bilirubin: 1.2 mg/dL — ABNORMAL HIGH (ref 0.3–0.9)
Total Bilirubin: 1.8 mg/dL — ABNORMAL HIGH (ref 0.0–1.2)
Total Protein: 5.6 g/dL — ABNORMAL LOW (ref 6.5–8.1)

## 2024-07-06 LAB — BASIC METABOLIC PANEL WITH GFR
Anion gap: 14 (ref 5–15)
BUN: 26 mg/dL — ABNORMAL HIGH (ref 8–23)
CO2: 22 mmol/L (ref 22–32)
Calcium: 8.1 mg/dL — ABNORMAL LOW (ref 8.9–10.3)
Chloride: 101 mmol/L (ref 98–111)
Creatinine, Ser: 1.11 mg/dL — ABNORMAL HIGH (ref 0.44–1.00)
GFR, Estimated: 51 mL/min — ABNORMAL LOW (ref >60)
Glucose, Bld: 133 mg/dL — ABNORMAL HIGH (ref 70–99)
Potassium: 3.6 mmol/L (ref 3.5–5.1)
Sodium: 137 mmol/L (ref 135–145)

## 2024-07-06 LAB — MAGNESIUM: Magnesium: 1.6 mg/dL — ABNORMAL LOW (ref 1.7–2.4)

## 2024-07-06 LAB — PHOSPHORUS: Phosphorus: 4.5 mg/dL (ref 2.5–4.6)

## 2024-07-06 MED ORDER — FAMOTIDINE 20 MG PO TABS
20.0000 mg | ORAL_TABLET | Freq: Two times a day (BID) | ORAL | Status: DC
  Administered 2024-07-06 – 2024-07-08 (×5): 20 mg via ORAL
  Filled 2024-07-06 (×5): qty 1

## 2024-07-06 MED ORDER — HYDROXYZINE HCL 25 MG PO TABS: 25.0000 mg | ORAL_TABLET | Freq: Three times a day (TID) | ORAL | Status: DC | PRN

## 2024-07-06 MED ORDER — DIPHENHYDRAMINE HCL 25 MG PO CAPS
25.0000 mg | ORAL_CAPSULE | Freq: Four times a day (QID) | ORAL | Status: DC | PRN
  Administered 2024-07-06 – 2024-07-08 (×6): 25 mg via ORAL
  Filled 2024-07-06 (×6): qty 1

## 2024-07-06 MED ORDER — CEFAZOLIN SODIUM-DEXTROSE 2-4 GM/100ML-% IV SOLN
2.0000 g | Freq: Three times a day (TID) | INTRAVENOUS | Status: DC
  Administered 2024-07-06 – 2024-07-08 (×6): 2 g via INTRAVENOUS
  Filled 2024-07-06 (×6): qty 100

## 2024-07-06 MED ORDER — MAGNESIUM SULFATE 2 GM/50ML IV SOLN
2.0000 g | Freq: Once | INTRAVENOUS | Status: AC
  Administered 2024-07-06: 2 g via INTRAVENOUS
  Filled 2024-07-06: qty 50

## 2024-07-06 NOTE — Progress Notes (Signed)
 PROGRESS NOTE    Kathleen Wiley  FMW:[id number] DOB: 08-26-1946 DOA: 07/05/2024 PCP: Dyane Anthony RAMAN, FNP    Brief Narrative:  78 year old with history of MDS with pancytopenia, paroxysmal A-fib not on anticoagulation, HTN, HLD, DM 2, recent admission due to right lower extremity cellulitis from 8/3 - 8/7 comes to the hospital with worsening right lower extremity swelling and pain.  While in the hospital patient was treated with IV Rocephin  and thereafter 10 days of p.o. cefadroxil ..  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  With petechial rash.  In the past patient has developed severe cellulitis of the right lower extremity in 2023 requiring operative debridement and VAC placement by Dr. Harden. - Current antibiotics clindamycin  and cefepime  -Consulted ID - IV fluids -Will add antihistamines, if gets worse, will order Benadryl     AKI stage I Baseline creatinine around 0.7, elevated to 1.33.  Prerenal in nature continue IV fluids.   Elevated T. Bili, improved Trend LFT, suspect in setting of sepsis.    MDS, pancytopenia Follows outpatient oncology/hematology Dr. Federico.  Not on any treatment at this time.  They are closely monitoring.  Paroxysmal A-fib  Not on anticoagulation, reduce metoprolol  to 12.5 mg bedtime with borderline BP   Hypertension /LE edema  Hold home Lasix   Hyperlipidemia  Continue home rosuvastatin   Diabetes type 2/neuropathy Holding home metformin.  SSI for very sensitive.  Continue home gabapentin   GERD  Sub home PPI  Mood disorder  Continue home escitalopram    Body mass index is 35.35 kg/m.  Obesity class II would benefit  from weight loss outpatient   DVT prophylaxis:  SQ Heparin        Code Status: Full Code Family Communication:   Status is: Inpatient Remains inpatient appropriate because: Cont hosp stay for IV Abx.    PT Follow up Recs:   Subjective: Seen at bedside.  Has worsening right lower extremity rash along with that she has diffuse body pruritic rash including her face.   Examination:  General exam: Appears calm and comfortable  Respiratory system: Clear to auscultation. Respiratory effort normal. Cardiovascular system: S1 & S2 heard, RRR. No JVD, murmurs, rubs, gallops or clicks. No pedal edema. Gastrointestinal system: Abdomen is nondistended, soft and nontender. No organomegaly or masses felt. Normal bowel sounds heard. Central nervous system: Alert and oriented. No focal neurological deficits. Extremities: Symmetric 5 x 5 power. Skin: Right lower extremity erythema and warmth.  Diffuse pruritic rash including her face Psychiatry: Judgement and insight appear normal. Mood & affect appropriate.                Diet Orders (From admission, onward)     Start     Ordered   07/05/24 2120  Diet Carb Modified Fluid consistency: Thin; Room service appropriate? Yes  Diet effective now       Question Answer Comment  Diet-HS Snack? Nothing   Calorie Level Medium 1600-2000   Fluid consistency: Thin   Room service appropriate? Yes      07/05/24 2119            Objective: Vitals:   07/05/24 2312 07/06/24 0408 07/06/24 0845  07/06/24 0905  BP: (!) 104/42 (!) 107/51 (!) 94/42 (!) 111/48  Pulse: 79 74 75 74  Resp: 17 16 18    Temp: 98.8 F (37.1 C) 98.4 F (36.9 C) 98.6 F (37 C)   TempSrc: Oral Oral    SpO2: 95% 96% 97%   Weight:      Height:        Intake/Output Summary (Last 24 hours) at 07/06/2024 1108 Last data filed at 07/05/2024 2318 Gross per 24 hour  Intake 1362.36 ml  Output --  Net 1362.36 ml   Filed Weights   07/05/24 1657  Weight: 99.3 kg     Scheduled Meds:  escitalopram   20 mg Oral QHS   famotidine   20 mg Oral BID   gabapentin   300 mg Oral BID   heparin   5,000 Units Subcutaneous Q8H   metoprolol  succinate  12.5 mg Oral QHS   pantoprazole   40 mg Oral Daily   rosuvastatin   5 mg Oral QHS   sodium chloride  flush  3 mL Intravenous Q12H   Continuous Infusions:   ceFAZolin  (ANCEF ) IV     lactated ringers  100 mL/hr at 07/06/24 0902    Nutritional status     Body mass index is 35.35 kg/m.  Data Reviewed:   CBC: Recent Labs  Lab 07/05/24 1710 07/06/24 0510  WBC 4.8 3.5*  HGB 12.9 10.6*  HCT 38.4 32.1*  MCV 97.2 98.5  PLT 110* 82*   Basic Metabolic Panel: Recent Labs  Lab 07/05/24 1820 07/06/24 0510  NA 137 137  K 3.6 3.6  CL 101 101  CO2 22 22  GLUCOSE 184* 133*  BUN 19 26*  CREATININE 1.33* 1.11*  CALCIUM  8.7* 8.1*  MG  --  1.6*  PHOS  --  4.5   GFR: Estimated Creatinine Clearance: 50.5 mL/min (A) (by C-G formula based on SCr of 1.11 mg/dL (H)). Liver Function Tests: Recent Labs  Lab 07/05/24 1820 07/06/24 0510  AST 43* 28  ALT 28 21  ALKPHOS 68 53  BILITOT 2.4* 1.8*  PROT 6.9 5.6*  ALBUMIN  3.5 2.7*   Recent Labs  Lab 07/05/24 1820  LIPASE 28   No results for input(s): AMMONIA in the last 168 hours. Coagulation Profile: No results for input(s): INR, PROTIME in the last 168 hours. Cardiac Enzymes: No results for input(s): CKTOTAL, CKMB, CKMBINDEX, TROPONINI in the last 168 hours. BNP (last 3 results) No results for input(s): PROBNP in the last 8760 hours. HbA1C: No results for input(s): HGBA1C in the last 72 hours. CBG: No results for input(s): GLUCAP in the last 168 hours. Lipid Profile: No results for input(s): CHOL, HDL, LDLCALC, TRIG, CHOLHDL, LDLDIRECT in the last 72 hours. Thyroid  Function Tests: No results for input(s): TSH, T4TOTAL, FREET4, T3FREE, THYROIDAB in the last 72 hours. Anemia Panel: No results for input(s):  VITAMINB12, FOLATE, FERRITIN, TIBC, IRON, RETICCTPCT in the last 72 hours. Sepsis Labs: Recent Labs  Lab 07/05/24 1717  LATICACIDVEN 4.5*    Recent Results (from the past 240 hours)  Blood culture (routine x 2)     Status: None (Preliminary result)   Collection Time: 07/05/24  8:50 PM   Specimen: BLOOD  Result Value Ref Range Status   Specimen Description BLOOD SITE NOT SPECIFIED  Final   Special Requests   Final    BOTTLES DRAWN AEROBIC AND ANAEROBIC Blood Culture adequate volume   Culture   Final    NO GROWTH < 12 HOURS Performed at [facility],  [address], [place], [place] [address]    Report Status PENDING  Incomplete  Blood culture (routine x 2)     Status: None (Preliminary result)   Collection Time: 07/06/24 12:06 AM   Specimen: BLOOD LEFT ARM  Result Value Ref Range Status   Specimen Description BLOOD LEFT ARM  Final   Special Requests   Final    BOTTLES DRAWN AEROBIC AND ANAEROBIC Blood Culture results may not be optimal due to an inadequate volume of blood received in culture bottles   Culture   Final    NO GROWTH < 12 HOURS Performed at [facility], [address], [place], [place] [address]    Report Status PENDING  Incomplete         Radiology Studies: DG Tibia/Fibula Right Result Date: 07/05/2024 CLINICAL DATA:  Cellulitis EXAM: RIGHT TIBIA AND FIBULA - 2 VIEW COMPARISON:  06/12/2024 FINDINGS: Knee replacement with normal alignment. No fracture or malalignment. No osseous destructive change. Soft tissue edema without emphysema. IMPRESSION: Soft tissue edema without emphysema. No acute osseous abnormality. Electronically Signed   By: Luke Bun M.D.   On: 07/05/2024 21:54   DG Foot Complete Right Result Date: 07/05/2024 CLINICAL DATA:  ?  Osteomyelitis of the right 2nd toe EXAM: RIGHT FOOT COMPLETE - 3+ VIEW COMPARISON:  May 06, 2023 FINDINGS: Redemonstrated severe hallux valgus deformity of the first digit. Postsurgical  changes of the first and second metatarsals and the proximal phalanx of the great toe. No acute fracture or dislocation. Bony ankylosis across the 2nd-4th proximal interphalangeal joints. Soft tissue swelling about the second digit with a small amount of subcutaneous gas along the plantar surface. Moderate degenerative changes of the midfoot. IMPRESSION: Soft tissue swelling about the second digit with a small amount of subcutaneous gas along the plantar surface, possibly due to gas producing soft tissue infection or ulceration. No radiographic findings of osteomyelitis, at this time. Electronically Signed   By: Rogelia Myers M.D.   On: 07/05/2024 19:18   DG Chest Portable 1 View Result Date: 07/05/2024 CLINICAL DATA:  Shortness of breath EXAM: PORTABLE CHEST 1 VIEW COMPARISON:  Chest radiograph June 10, 2024 FINDINGS: Mildly enlarged enlarged cardiomediastinal silhouette. Both lungs are clear. No new consolidation. No pleural effusion or pneumothorax. The visualized skeletal structures are unremarkable. IMPRESSION: Stable mild cardiomegaly. No new nodule. Electronically Signed   By: Megan  Zare M.D.   On: 07/05/2024 19:15           LOS: 1 day   Time spent= 35 mins    Burgess JAYSON Dare, MD [facility]  If 7PM-7AM, please contact night-coverage  07/06/2024, 11:08 AM

## 2024-07-06 NOTE — Hospital Course (Addendum)
 Brief Narrative:  78 year old with history of MDS with pancytopenia, paroxysmal A-fib not on anticoagulation, HTN, HLD, DM 2, recent admission due to right lower extremity cellulitis from 8/3 - 8/7 comes to the hospital with worsening right lower extremity swelling and pain.  While in the hospital patient was treated with IV Rocephin  and thereafter 10 days of p.o. cefadroxil ..  Readmitted due to recurrent infection.  Developed diffuse rash in addition to cellulitis which was suspected secondary to some form of reaction.  Antibiotics were changed from clindamycin  and cefepime  to Ancef . Eventually all had diffuse rash resolved and right lower extremity cellulitis was improving.  Cleared by infectious disease for discharge.  Advised to give 2 weeks of p.o. cefadroxil  prescription and outpatient follow-up will be arranged.   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  With petechial rash.  In the past patient has developed severe cellulitis of the right lower extremity in 2023 requiring operative debridement and VAC placement by Dr. Harden. - Seen by ID, currently on IV cefazolin  will transition to 2 weeks of cefadroxil  per ID recommendation. - Diffuse rash has resolved    AKI stage I Baseline creatinine around 0.7, elevated to 1.33.  Prerenal in nature continue IV fluids.   Elevated T. Bili, improved Trend LFT, suspect in setting of sepsis.    MDS, pancytopenia Follows outpatient oncology/hematology Dr. Federico.  Not on any treatment at this time.  They are closely monitoring.  Paroxysmal A-fib  Not on anticoagulation, reduce metoprolol  to 12.5 mg bedtime with borderline BP   Hypertension /LE  edema  Hold home Lasix   Hyperlipidemia  Continue home rosuvastatin   Diabetes type 2/neuropathy Home regimen  GERD  Sub home PPI  Mood disorder  Continue home escitalopram    Body mass index is 35.35 kg/m.  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  Wishing to go home  Examination:  General exam: Appears calm and comfortable  Respiratory system: Clear to auscultation. Respiratory effort normal. Cardiovascular system: S1 & S2 heard, RRR. No JVD, murmurs, rubs, gallops or clicks. No pedal edema. Gastrointestinal system: Abdomen is nondistended, soft and nontender. No organomegaly or masses felt. Normal bowel sounds heard. Central nervous system: Alert and oriented. No focal neurological deficits. Extremities: Symmetric 5 x 5 power. Skin: Right lower extremity erythema and warmth.  Diffuse pruritic rash including her face has significantly improved Psychiatry: Judgement and insight appear normal. Mood & affect appropriate.

## 2024-07-06 NOTE — Plan of Care (Signed)

## 2024-07-06 NOTE — Consult Note (Addendum)
 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  I will check in on her over this Labor Day weekend to monitor her ponce antibiotics and her rash  Principal Problem:   Cellulitis of lower extremity Active Problems:   Sepsis (HCC)   Scheduled Meds:  escitalopram   20 mg Oral QHS   famotidine   20 mg Oral BID   gabapentin   300 mg Oral BID   heparin   5,000 Units Subcutaneous Q8H   metoprolol  succinate  12.5 mg Oral QHS   pantoprazole   40 mg Oral Daily   rosuvastatin   5 mg Oral QHS   sodium chloride  flush  3 mL Intravenous Q12H   Continuous Infusions:   ceFAZolin  (ANCEF ) IV     lactated ringers  100 mL/hr at 07/06/24 0902   PRN Meds:.acetaminophen , albuterol , diphenhydrAMINE , melatonin, ondansetron  (ZOFRAN ) IV, polyethylene glycol  HPI: Kathleen Wiley is a 78 y.o. female who I saw recently earlier this month when she was admitted with bilateral cellulitis after a pedicure.  Notes she has a history of recurrent cellulitis and comorbid paroxysmal atrial fibrillation diabetes mellitus and myelodysplastic syndrome.  During her hospitalization earlier this month we ended up narrowing her from IV to oral antibiotics and discharging her on oral cefadroxil  with plans for her to have cefadroxil  on hand to start the first signs of infection she indeed did fill this  prescription but yesterday when she first became acutely ill did not initiate the cefadroxil  at home.  She presented ultimately to the ER with worsening erythema involving her right lower extremity.  It now has a more darkened appearance to what I recall from previously.  She had blood cultures taken she was placed on cefepime  and clindamycin .  She now has developed a diffuse rash concerning for a beta-lactam allergy.  We will switch her to cefazolin  which has a different side chains and cefepime  and attribute the rash to the cefepime .  Hopefully she does fine on cefazolin .  I will plan on checking in on her over this weekend. I hope that she does NOT have a broad allergy to cephalosporins as they can be very useful for patients with recurrent cellulitis.  She should have her legs elevated above her heart when possible.   I have personally spent 82 minutes involved in face-to-face and non-face-to-face activities for this patient on the day of the visit. Professional time spent includes the following activities: Preparing to see the patient (review of tests), Obtaining and/or reviewing separately obtained history (admission/discharge record), Performing a medically appropriate examination and/or evaluation , Ordering medications/tests/procedures, referring and communicating with other health care professionals, Documenting clinical information in the EMR, Independently interpreting results (not separately reported), Communicating results to the patient/family/caregiver, Counseling and educating the patient/family/caregiver and Care coordination (not separately reported).   Evaluation of the patient requires complex  antimicrobial therapy evaluation, counseling , isolation needs to reduce disease transmission and risk assessment and mitigation.     Review of Systems: Review of Systems  Constitutional:  Positive for fever. Negative for chills, malaise/fatigue and weight loss.  HENT:  Negative  for congestion and sore throat.   Eyes:  Negative for blurred vision and photophobia.  Respiratory:  Negative for cough, shortness of breath and wheezing.   Cardiovascular:  Negative for chest pain, palpitations and leg swelling.  Gastrointestinal:  Negative for abdominal pain, blood in stool, constipation, diarrhea, heartburn, melena, nausea and vomiting.  Genitourinary:  Negative for dysuria, flank pain and hematuria.  Musculoskeletal:  Negative for back pain, falls, joint pain and myalgias.  Skin:  Positive for rash. Negative for itching.  Neurological:  Negative for dizziness, focal weakness, loss of consciousness, weakness and headaches.  Endo/Heme/Allergies:  Does not bruise/bleed easily.  Psychiatric/Behavioral:  Negative for depression and suicidal ideas. The patient does not have insomnia.     Past Medical History:  Diagnosis Date   Anxiety    Arthritis    knee, hip   Cellulitis 08/2016   left leg   Complication of anesthesia    Diabetes mellitus without complication (HCC)    GERD (gastroesophageal reflux disease)    Heart murmur    slight   informed years ago.-    Hernia, umbilical    History of atrial fibrillation 2007   with flagyl   History of endometriosis    Hypercholesteremia    Hypertension    has not been to a cardiologist   PONV (postoperative nausea and vomiting)    Rectal vaginal fistula    Refusal of blood transfusions as patient is Jehovah's Witness    Sleep apnea     Social History   Tobacco Use   Smoking status: Former    Current packs/day: 0.00    Average packs/day: 1 pack/day for 10.0 years (10.0 ttl pk-yrs)    Types: Cigarettes    Start date: 11/09/1971    Quit date: 11/08/1981    Years since quitting: 42.6   Smokeless tobacco: Never  Vaping Use   Vaping status: Never Used  Substance Use Topics   Alcohol use: No   Drug use: No    Family History  Problem Relation Age of Onset   Dementia Mother    Cancer Father    Breast cancer Neg  Hx    Allergies  Allergen Reactions   Other     Refuses whole blood but does accept albumin    Ditropan  [Oxybutynin ] Other (See Comments)    Unknown reaction   Flagyl [Metronidazole] Other (See Comments)    Chest pain Induced A-fib    Lipitor [Atorvastatin] Other (See Comments)    Unknown reaction   Cefepime  Rash    Has tolerated Cefazolin  + cefadroxil    Septra [Sulfamethoxazole-Trimethoprim] Rash    Blistering rash    Vancomycin  Itching    OBJECTIVE: Blood pressure (!) 111/48, pulse 74, temperature 98.6 F (37 C), resp. rate 18, height 5' 6 (1.676 m), weight 99.3 kg, SpO2 97%.  Physical Exam Constitutional:      General: She is not in acute distress.    Appearance: Normal appearance. She is well-developed. She is not ill-appearing or diaphoretic.  HENT:     Head: Normocephalic and atraumatic.     Right Ear: Hearing and external ear normal.     Left Ear: Hearing and external ear normal.     Nose: No nasal deformity or  rhinorrhea.  Eyes:     General: No scleral icterus.    Conjunctiva/sclera: Conjunctivae normal.     Right eye: Right conjunctiva is not injected.     Left eye: Left conjunctiva is not injected.     Pupils: Pupils are equal, round, and reactive to light.  Neck:     Vascular: No JVD.  Cardiovascular:     Rate and Rhythm: Normal rate and regular rhythm.     Heart sounds: S1 normal and S2 normal.  Pulmonary:     Effort: Pulmonary effort is normal. No respiratory distress.     Breath sounds: No wheezing.  Abdominal:     General: There is no distension.     Palpations: Abdomen is soft.  Musculoskeletal:        General: Normal range of motion.     Right shoulder: Normal.     Left shoulder: Normal.     Cervical back: Normal range of motion and neck supple.     Right hip: Normal.     Left hip: Normal.     Right knee: Normal.     Left knee: Normal.  Lymphadenopathy:     Head:     Right side of head: No submandibular, preauricular or posterior  auricular adenopathy.     Left side of head: No submandibular, preauricular or posterior auricular adenopathy.     Cervical: No cervical adenopathy.     Right cervical: No superficial or deep cervical adenopathy.    Left cervical: No superficial or deep cervical adenopathy.  Skin:    General: Skin is warm and dry.     Coloration: Skin is not pale.     Findings: No abrasion, bruising, ecchymosis, erythema, lesion or rash.     Nails: There is no clubbing.  Neurological:     Mental Status: She is alert and oriented to person, place, and time.     Sensory: No sensory deficit.     Coordination: Coordination normal.     Gait: Gait normal.  Psychiatric:        Attention and Perception: She is attentive.        Mood and Affect: Mood is anxious.        Speech: Speech normal.        Behavior: Behavior normal. Behavior is cooperative.        Thought Content: Thought content normal.        Cognition and Memory: Cognition and memory normal.        Judgment: Judgment normal.     Rash        LE    Lab Results Lab Results  Component Value Date   WBC 3.5 (L) 07/06/2024   HGB 10.6 (L) 07/06/2024   HCT 32.1 (L) 07/06/2024   MCV 98.5 07/06/2024   PLT 82 (L) 07/06/2024    Lab Results  Component Value Date   CREATININE 1.11 (H) 07/06/2024   BUN 26 (H) 07/06/2024   NA 137 07/06/2024   K 3.6 07/06/2024   CL 101 07/06/2024   CO2 22 07/06/2024    Lab Results  Component Value Date   ALT 21 07/06/2024   AST 28 07/06/2024   ALKPHOS 53 07/06/2024   BILITOT 1.8 (H) 07/06/2024     Microbiology: Recent Results (from the past 240 hours)  Blood culture (routine x 2)     Status: None (Preliminary result)   Collection Time: 07/05/24  8:50 PM   Specimen: BLOOD  Result Value Ref  Range Status   Specimen Description BLOOD SITE NOT SPECIFIED  Final   Special Requests   Final    BOTTLES DRAWN AEROBIC AND ANAEROBIC Blood Culture adequate volume   Culture   Final    NO GROWTH < 12  HOURS Performed at [facility], [address], [place], [place] [address]    Report Status PENDING  Incomplete  Blood culture (routine x 2)     Status: None (Preliminary result)   Collection Time: 07/06/24 12:06 AM   Specimen: BLOOD LEFT ARM  Result Value Ref Range Status   Specimen Description BLOOD LEFT ARM  Final   Special Requests   Final    BOTTLES DRAWN AEROBIC AND ANAEROBIC Blood Culture results may not be optimal due to an inadequate volume of blood received in culture bottles   Culture   Final    NO GROWTH < 12 HOURS Performed at [facility], [address], [place], [place] [address]    Report Status PENDING  Incomplete    Jomarie Fleeta Rothman, MD [facility] [phone] pager  07/06/2024, 12:00 PM

## 2024-07-07 DIAGNOSIS — L03119 Cellulitis of unspecified part of limb: Secondary | ICD-10-CM | POA: Diagnosis not present

## 2024-07-07 DIAGNOSIS — D469 Myelodysplastic syndrome, unspecified: Secondary | ICD-10-CM

## 2024-07-07 DIAGNOSIS — D696 Thrombocytopenia, unspecified: Secondary | ICD-10-CM

## 2024-07-07 MED ORDER — MAGNESIUM OXIDE -MG SUPPLEMENT 400 (240 MG) MG PO TABS
800.0000 mg | ORAL_TABLET | Freq: Once | ORAL | Status: AC
  Administered 2024-07-07: 800 mg via ORAL
  Filled 2024-07-07: qty 2

## 2024-07-07 MED ORDER — SODIUM CHLORIDE 0.9 % IV SOLN: INTRAVENOUS | Status: AC

## 2024-07-07 MED ORDER — METOPROLOL TARTRATE 5 MG/5ML IV SOLN: 5.0000 mg | INTRAVENOUS | Status: DC | PRN

## 2024-07-07 MED ORDER — MAGNESIUM SULFATE 4 GM/100ML IV SOLN
4.0000 g | Freq: Once | INTRAVENOUS | Status: AC
  Administered 2024-07-07: 4 g via INTRAVENOUS
  Filled 2024-07-07: qty 100

## 2024-07-07 MED ORDER — IPRATROPIUM-ALBUTEROL 0.5-2.5 (3) MG/3ML IN SOLN: 3.0000 mL | RESPIRATORY_TRACT | Status: DC | PRN

## 2024-07-07 MED ORDER — POTASSIUM CHLORIDE CRYS ER 20 MEQ PO TBCR
40.0000 meq | EXTENDED_RELEASE_TABLET | Freq: Once | ORAL | Status: AC
  Administered 2024-07-07: 40 meq via ORAL
  Filled 2024-07-07: qty 2

## 2024-07-07 MED ORDER — HYDRALAZINE HCL 20 MG/ML IJ SOLN: 10.0000 mg | INTRAMUSCULAR | Status: DC | PRN

## 2024-07-07 NOTE — Progress Notes (Signed)
 PROGRESS NOTE    Kathleen Wiley  FMW:[id number] DOB: 1946-07-17 DOA: 07/05/2024 PCP: Dyane Anthony RAMAN, FNP    Brief Narrative:  78 year old with history of MDS with pancytopenia, paroxysmal A-fib not on anticoagulation, HTN, HLD, DM 2, recent admission due to right lower extremity cellulitis from 8/3 - 8/7 comes to the hospital with worsening right lower extremity swelling and pain.  While in the hospital patient was treated with IV Rocephin  and thereafter 10 days of p.o. cefadroxil ..  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  With petechial rash.  In the past patient has developed severe cellulitis of the right lower extremity in 2023 requiring operative debridement and VAC placement by Dr. Harden. - Seen by ID, currently on IV cefazolin   - antihistamines added    AKI stage I Baseline creatinine around 0.7, elevated to 1.33.  Prerenal in nature continue IV fluids.   Elevated T. Bili, improved Trend LFT, suspect in setting of sepsis.    MDS, pancytopenia Follows outpatient oncology/hematology Dr. Federico.  Not on any treatment at this time.  They are closely monitoring.  Paroxysmal A-fib  Not on anticoagulation, reduce metoprolol  to 12.5 mg bedtime with borderline BP   Hypertension /LE edema  Hold home Lasix   Hyperlipidemia  Continue home rosuvastatin   Diabetes type 2/neuropathy Holding home metformin.  SSI for very sensitive.  Continue home gabapentin   GERD  Sub home PPI  Mood disorder  Continue home escitalopram    Body mass index is 35.35 kg/m.  Obesity class II would benefit from weight loss outpatient   DVT prophylaxis:  SQ Heparin         Code Status: Full Code Family Communication:   Status is: Inpatient Remains inpatient appropriate because: Cont hosp stay for IV Abx.    PT Follow up Recs:   Subjective:  Seen at bedside.  Facial, arm and torso rash is better.  Examination:  General exam: Appears calm and comfortable  Respiratory system: Clear to auscultation. Respiratory effort normal. Cardiovascular system: S1 & S2 heard, RRR. No JVD, murmurs, rubs, gallops or clicks. No pedal edema. Gastrointestinal system: Abdomen is nondistended, soft and nontender. No organomegaly or masses felt. Normal bowel sounds heard. Central nervous system: Alert and oriented. No focal neurological deficits. Extremities: Symmetric 5 x 5 power. Skin: Right lower extremity erythema and warmth.  Diffuse pruritic rash including her face has improved Psychiatry: Judgement and insight appear normal. Mood & affect appropriate.                Diet Orders (From admission, onward)     Start     Ordered   07/05/24 2120  Diet Carb Modified Fluid consistency: Thin; Room service appropriate? Yes  Diet effective now       Question Answer Comment  Diet-HS Snack? Nothing   Calorie Level Medium 1600-2000   Fluid consistency: Thin   Room service appropriate? Yes      07/05/24 2119            Objective: Vitals:   07/06/24 1552 07/06/24 1956 07/07/24 0435 07/07/24 0753  BP: (!) 111/48 (!) 114/47 (!) 106/49 (!) 112/52  Pulse: 77 78 70  71  Resp: 18 17 16 19   Temp: 99.7 F (37.6 C) 100.2 F (37.9 C) 98.5 F (36.9 C) 98.4 F (36.9 C)  TempSrc:  Oral Oral Oral  SpO2: 94% 95%  95%  Weight:      Height:       No intake or output data in the 24 hours ending 07/07/24 1000 Filed Weights   07/05/24 1657  Weight: 99.3 kg    Scheduled Meds:  escitalopram   20 mg Oral QHS   famotidine   20 mg Oral BID   gabapentin   300 mg Oral BID   heparin   5,000 Units Subcutaneous Q8H   metoprolol  succinate  12.5 mg Oral QHS   pantoprazole    40 mg Oral Daily   rosuvastatin   5 mg Oral QHS   sodium chloride  flush  3 mL Intravenous Q12H   Continuous Infusions:  sodium chloride  75 mL/hr at 07/07/24 0939    ceFAZolin  (ANCEF ) IV 2 g (07/07/24 0544)   magnesium  sulfate bolus IVPB      Nutritional status     Body mass index is 35.35 kg/m.  Data Reviewed:   CBC: Recent Labs  Lab 07/05/24 1710 07/06/24 0510  WBC 4.8 3.5*  HGB 12.9 10.6*  HCT 38.4 32.1*  MCV 97.2 98.5  PLT 110* 82*   Basic Metabolic Panel: Recent Labs  Lab 07/05/24 1820 07/06/24 0510  NA 137 137  K 3.6 3.6  CL 101 101  CO2 22 22  GLUCOSE 184* 133*  BUN 19 26*  CREATININE 1.33* 1.11*  CALCIUM  8.7* 8.1*  MG  --  1.6*  PHOS  --  4.5   GFR: Estimated Creatinine Clearance: 50.5 mL/min (A) (by C-G formula based on SCr of 1.11 mg/dL (H)). Liver Function Tests: Recent Labs  Lab 07/05/24 1820 07/06/24 0510  AST 43* 28  ALT 28 21  ALKPHOS 68 53  BILITOT 2.4* 1.8*  PROT 6.9 5.6*  ALBUMIN  3.5 2.7*   Recent Labs  Lab 07/05/24 1820  LIPASE 28   No results for input(s): AMMONIA in the last 168 hours. Coagulation Profile: No results for input(s): INR, PROTIME in the last 168 hours. Cardiac Enzymes: No results for input(s): CKTOTAL, CKMB, CKMBINDEX, TROPONINI in the last 168 hours. BNP (last 3 results) No results for input(s): PROBNP in the last 8760 hours. HbA1C: No results for input(s): HGBA1C in the last 72 hours. CBG: No results for input(s): GLUCAP in the last 168 hours. Lipid Profile: No results for input(s): CHOL, HDL, LDLCALC, TRIG, CHOLHDL, LDLDIRECT in the last 72 hours. Thyroid  Function Tests: No results for input(s): TSH, T4TOTAL, FREET4, T3FREE, THYROIDAB in the last 72 hours. Anemia Panel: No results for input(s): VITAMINB12, FOLATE, FERRITIN, TIBC, IRON, RETICCTPCT in the last 72 hours. Sepsis Labs: Recent Labs  Lab 07/05/24 1717  LATICACIDVEN 4.5*    Recent  Results (from the past 240 hours)  Blood culture (routine x 2)     Status: None (Preliminary result)   Collection Time: 07/05/24  8:50 PM   Specimen: BLOOD  Result Value Ref Range Status   Specimen Description BLOOD SITE NOT SPECIFIED  Final   Special Requests   Final    BOTTLES DRAWN AEROBIC AND ANAEROBIC Blood Culture adequate volume   Culture   Final    NO GROWTH 2 DAYS Performed at [facility], [address] [place], [place] [address]    Report Status PENDING  Incomplete  Blood culture (routine x 2)  Status: None (Preliminary result)   Collection Time: 07/06/24 12:06 AM   Specimen: BLOOD LEFT ARM  Result Value Ref Range Status   Specimen Description BLOOD LEFT ARM  Final   Special Requests   Final    BOTTLES DRAWN AEROBIC AND ANAEROBIC Blood Culture results may not be optimal due to an inadequate volume of blood received in culture bottles   Culture   Final    NO GROWTH 1 DAY Performed at [facility], [address] [place], [place] [address]    Report Status PENDING  Incomplete         Radiology Studies: DG Tibia/Fibula Right Result Date: 07/05/2024 CLINICAL DATA:  Cellulitis EXAM: RIGHT TIBIA AND FIBULA - 2 VIEW COMPARISON:  06/12/2024 FINDINGS: Knee replacement with normal alignment. No fracture or malalignment. No osseous destructive change. Soft tissue edema without emphysema. IMPRESSION: Soft tissue edema without emphysema. No acute osseous abnormality. Electronically Signed   By: Luke Bun M.D.   On: 07/05/2024 21:54   DG Foot Complete Right Result Date: 07/05/2024 CLINICAL DATA:  ?  Osteomyelitis of the right 2nd toe EXAM: RIGHT FOOT COMPLETE - 3+ VIEW COMPARISON:  May 06, 2023 FINDINGS: Redemonstrated severe hallux valgus deformity of the first digit. Postsurgical changes of the first and second metatarsals and the proximal phalanx of the great toe. No acute fracture or dislocation. Bony ankylosis across the 2nd-4th proximal interphalangeal  joints. Soft tissue swelling about the second digit with a small amount of subcutaneous gas along the plantar surface. Moderate degenerative changes of the midfoot. IMPRESSION: Soft tissue swelling about the second digit with a small amount of subcutaneous gas along the plantar surface, possibly due to gas producing soft tissue infection or ulceration. No radiographic findings of osteomyelitis, at this time. Electronically Signed   By: Rogelia Myers M.D.   On: 07/05/2024 19:18   DG Chest Portable 1 View Result Date: 07/05/2024 CLINICAL DATA:  Shortness of breath EXAM: PORTABLE CHEST 1 VIEW COMPARISON:  Chest radiograph June 10, 2024 FINDINGS: Mildly enlarged enlarged cardiomediastinal silhouette. Both lungs are clear. No new consolidation. No pleural effusion or pneumothorax. The visualized skeletal structures are unremarkable. IMPRESSION: Stable mild cardiomegaly. No new nodule. Electronically Signed   By: Megan  Zare M.D.   On: 07/05/2024 19:15           LOS: 2 days   Time spent= 35 mins    Burgess JAYSON Dare, MD [facility]  If 7PM-7AM, please contact night-coverage  07/07/2024, 10:00 AM

## 2024-07-07 NOTE — Progress Notes (Addendum)
 Subjective: She feels better versus yesterday   Antibiotics:  Anti-infectives (From admission, onward)    Start     Dose/Rate Route Frequency Ordered Stop   07/06/24 1400  ceFAZolin  (ANCEF ) IVPB 2g/100 mL premix        2 g 200 mL/hr over 30 Minutes Intravenous Every 8 hours 07/06/24 0958     07/06/24 0630  ceFEPIme  (MAXIPIME ) 2 g in sodium chloride  0.9 % 100 mL IVPB  Status:  Discontinued        2 g 200 mL/hr over 30 Minutes Intravenous Every 12 hours 07/05/24 2150 07/06/24 0958   07/06/24 0330  clindamycin  (CLEOCIN ) IVPB 600 mg  Status:  Discontinued        600 mg 100 mL/hr over 30 Minutes Intravenous Every 8 hours 07/05/24 2150 07/06/24 0958   07/05/24 2200  linezolid  (ZYVOX ) IVPB 600 mg  Status:  Discontinued        600 mg 300 mL/hr over 60 Minutes Intravenous Every 12 hours 07/05/24 2107 07/05/24 2108   07/05/24 2200  linezolid  (ZYVOX ) IVPB 600 mg  Status:  Discontinued        600 mg 300 mL/hr over 60 Minutes Intravenous Every 12 hours 07/05/24 2108 07/05/24 2117   07/05/24 1930  clindamycin  (CLEOCIN ) IVPB 600 mg        600 mg 100 mL/hr over 30 Minutes Intravenous  Once 07/05/24 1928 07/05/24 2203   07/05/24 1830  vancomycin  (VANCOCIN ) IVPB 1000 mg/200 mL premix  Status:  Discontinued        1,000 mg 200 mL/hr over 60 Minutes Intravenous  Once 07/05/24 1824 07/05/24 1826   07/05/24 1830  ceFEPIme  (MAXIPIME ) 2 g in sodium chloride  0.9 % 100 mL IVPB        2 g 200 mL/hr over 30 Minutes Intravenous  Once 07/05/24 1824 07/05/24 2021   07/05/24 1830  vancomycin  (VANCOREADY) IVPB 1500 mg/300 mL  Status:  Discontinued        1,500 mg 150 mL/hr over 120 Minutes Intravenous  Once 07/05/24 1826 07/05/24 2107       Medications: Scheduled Meds:  escitalopram   20 mg Oral QHS   famotidine   20 mg Oral BID   gabapentin   300 mg Oral BID   heparin   5,000 Units Subcutaneous Q8H   metoprolol  succinate  12.5 mg Oral QHS   pantoprazole   40 mg Oral Daily   rosuvastatin   5 mg  Oral QHS   sodium chloride  flush  3 mL Intravenous Q12H   Continuous Infusions:  sodium chloride  75 mL/hr at 07/07/24 0939    ceFAZolin  (ANCEF ) IV 2 g (07/07/24 0544)   magnesium  sulfate bolus IVPB 4 g (07/07/24 1100)   PRN Meds:.acetaminophen , diphenhydrAMINE , hydrALAZINE , ipratropium-albuterol , melatonin, metoprolol  tartrate, ondansetron  (ZOFRAN ) IV, polyethylene glycol    Objective: Weight change:  No intake or output data in the 24 hours ending 07/07/24 1144 Blood pressure (!) 112/52, pulse 71, temperature 98.4 F (36.9 C), temperature source Oral, resp. rate 19, height 5' 6 (1.676 m), weight 99.3 kg, SpO2 95%. Temp:  [98.4 F (36.9 C)-100.2 F (37.9 C)] 98.4 F (36.9 C) (08/30 0753) Pulse Rate:  [70-78] 71 (08/30 0753) Resp:  [16-19] 19 (08/30 0753) BP: (106-114)/(47-52) 112/52 (08/30 0753) SpO2:  [94 %-95 %] 95 % (08/30 0753)  Physical Exam: Physical Exam Constitutional:      General: She is not in acute distress.    Appearance: She is well-developed. She is not diaphoretic.  HENT:     Head: Normocephalic and atraumatic.     Right Ear: External ear normal.     Left Ear: External ear normal.     Mouth/Throat:     Pharynx: No oropharyngeal exudate.  Eyes:     General: No scleral icterus.    Conjunctiva/sclera: Conjunctivae normal.     Pupils: Pupils are equal, round, and reactive to light.  Cardiovascular:     Rate and Rhythm: Normal rate and regular rhythm.  Pulmonary:     Effort: Pulmonary effort is normal. No respiratory distress.     Breath sounds: Normal breath sounds. No wheezing.  Abdominal:     General: There is no distension.     Palpations: Abdomen is soft.  Musculoskeletal:        General: No tenderness. Normal range of motion.  Lymphadenopathy:     Cervical: No cervical adenopathy.  Skin:    General: Skin is warm and dry.     Coloration: Skin is not pale.     Findings: Erythema present. No rash.  Neurological:     General: No focal deficit  present.     Mental Status: She is alert and oriented to person, place, and time.     Motor: No abnormal muscle tone.     Coordination: Coordination normal.  Psychiatric:        Mood and Affect: Mood normal.        Behavior: Behavior normal.        Thought Content: Thought content normal.        Judgment: Judgment normal.     RLE 8/29    RLE  8/30    Rash 8/29       Rash 8/30    CBC:    BMET Recent Labs    07/05/24 1820 07/06/24 0510  NA 137 137  K 3.6 3.6  CL 101 101  CO2 22 22  GLUCOSE 184* 133*  BUN 19 26*  CREATININE 1.33* 1.11*  CALCIUM  8.7* 8.1*     Liver Panel  Recent Labs    07/05/24 1820 07/06/24 0510  PROT 6.9 5.6*  ALBUMIN  3.5 2.7*  AST 43* 28  ALT 28 21  ALKPHOS 68 53  BILITOT 2.4* 1.8*  BILIDIR  --  0.6*  IBILI  --  1.2*       Sedimentation Rate Recent Labs    07/05/24 1756  ESRSEDRATE 15   C-Reactive Protein Recent Labs    07/05/24 1756  CRP 3.5*    Micro Results: Recent Results (from the past 720 hours)  Resp panel by RT-PCR (RSV, Flu A&B, Covid) Anterior Nasal Swab     Status: None   Collection Time: 06/10/24  6:00 AM   Specimen: Anterior Nasal Swab  Result Value Ref Range Status   SARS Coronavirus 2 by RT PCR NEGATIVE NEGATIVE Final   Influenza A by PCR NEGATIVE NEGATIVE Final   Influenza B by PCR NEGATIVE NEGATIVE Final    Comment: (NOTE) The Xpert Xpress SARS-CoV-2/FLU/RSV plus assay is intended as an aid in the diagnosis of influenza from Nasopharyngeal swab specimens and should not be used as a sole basis for treatment. Nasal washings and aspirates are unacceptable for Xpert Xpress SARS-CoV-2/FLU/RSV testing.  Fact Sheet for Patients: BloggerCourse.com  Fact Sheet for Healthcare Providers: SeriousBroker.it  This test is not yet approved or cleared by the [place]  FDA and has been authorized for detection and/or diagnosis of SARS-CoV-2  by FDA under an Emergency Use  Authorization (EUA). This EUA will remain in effect (meaning this test can be used) for the duration of the COVID-19 declaration under Section 564(b)(1) of the Act, 21 U.S.C. section 360bbb-3(b)(1), unless the authorization is terminated or revoked.     Resp Syncytial Virus by PCR NEGATIVE NEGATIVE Final    Comment: (NOTE) Fact Sheet for Patients: BloggerCourse.com  Fact Sheet for Healthcare Providers: SeriousBroker.it  This test is not yet approved or cleared by the [place]  FDA and has been authorized for detection and/or diagnosis of SARS-CoV-2 by FDA under an Emergency Use Authorization (EUA). This EUA will remain in effect (meaning this test can be used) for the duration of the COVID-19 declaration under Section 564(b)(1) of the Act, 21 U.S.C. section 360bbb-3(b)(1), unless the authorization is terminated or revoked.  Performed at [facility], [address], [place], [place] [address]   Blood culture (routine x 2)     Status: None   Collection Time: 06/10/24  6:47 AM   Specimen: BLOOD LEFT FOREARM  Result Value Ref Range Status   Specimen Description BLOOD LEFT FOREARM  Final   Special Requests   Final    BOTTLES DRAWN AEROBIC AND ANAEROBIC Blood Culture results may not be optimal due to an inadequate volume of blood received in culture bottles   Culture   Final    NO GROWTH 5 DAYS Performed at [facility], [address], [place], [place] [address]    Report Status 06/15/2024 FINAL  Final  Blood culture (routine x 2)     Status: None   Collection Time: 06/10/24  6:52 AM   Specimen: BLOOD RIGHT ARM  Result Value Ref Range Status   Specimen Description BLOOD RIGHT ARM  Final   Special Requests   Final    BOTTLES DRAWN AEROBIC ONLY Blood Culture results may not be optimal due to an inadequate volume of blood received in culture bottles   Culture   Final    NO GROWTH 5  DAYS Performed at [facility], [address], [place], [place] [address]    Report Status 06/15/2024 FINAL  Final  Blood culture (routine x 2)     Status: None (Preliminary result)   Collection Time: 07/05/24  8:50 PM   Specimen: BLOOD  Result Value Ref Range Status   Specimen Description BLOOD SITE NOT SPECIFIED  Final   Special Requests   Final    BOTTLES DRAWN AEROBIC AND ANAEROBIC Blood Culture adequate volume   Culture   Final    NO GROWTH 2 DAYS Performed at [facility], [address], [place], [place] [address]    Report Status PENDING  Incomplete  Blood culture (routine x 2)     Status: None (Preliminary result)   Collection Time: 07/06/24 12:06 AM   Specimen: BLOOD LEFT ARM  Result Value Ref Range Status   Specimen Description BLOOD LEFT ARM  Final   Special Requests   Final    BOTTLES DRAWN AEROBIC AND ANAEROBIC Blood Culture results may not be optimal due to an inadequate volume of blood received in culture bottles   Culture   Final    NO GROWTH 1 DAY Performed at [facility], [address] [place], [place] [address]    Report Status PENDING  Incomplete    Studies/Results: DG Tibia/Fibula Right Result Date: 07/05/2024 CLINICAL DATA:  Cellulitis EXAM: RIGHT TIBIA AND FIBULA - 2 VIEW COMPARISON:  06/12/2024 FINDINGS: Knee replacement  with normal alignment. No fracture or malalignment. No osseous destructive change. Soft tissue edema without emphysema. IMPRESSION: Soft tissue edema without emphysema. No acute osseous abnormality. Electronically Signed   By: Luke Bun M.D.   On: 07/05/2024 21:54   DG Foot Complete Right Result Date: 07/05/2024 CLINICAL DATA:  ?  Osteomyelitis of the right 2nd toe EXAM: RIGHT FOOT COMPLETE - 3+ VIEW COMPARISON:  May 06, 2023 FINDINGS: Redemonstrated severe hallux valgus deformity of the first digit. Postsurgical changes of the first and second metatarsals and the proximal phalanx of the great toe. No acute  fracture or dislocation. Bony ankylosis across the 2nd-4th proximal interphalangeal joints. Soft tissue swelling about the second digit with a small amount of subcutaneous gas along the plantar surface. Moderate degenerative changes of the midfoot. IMPRESSION: Soft tissue swelling about the second digit with a small amount of subcutaneous gas along the plantar surface, possibly due to gas producing soft tissue infection or ulceration. No radiographic findings of osteomyelitis, at this time. Electronically Signed   By: Rogelia Myers M.D.   On: 07/05/2024 19:18   DG Chest Portable 1 View Result Date: 07/05/2024 CLINICAL DATA:  Shortness of breath EXAM: PORTABLE CHEST 1 VIEW COMPARISON:  Chest radiograph June 10, 2024 FINDINGS: Mildly enlarged enlarged cardiomediastinal silhouette. Both lungs are clear. No new consolidation. No pleural effusion or pneumothorax. The visualized skeletal structures are unremarkable. IMPRESSION: Stable mild cardiomegaly. No new nodule. Electronically Signed   By: Megan  Zare M.D.   On: 07/05/2024 19:15      Assessment/Plan:  INTERVAL HISTORY: Patient's rash seems better since stopping cefepime  and clindamycin  and changing to cefazolin    Principal Problem:   Cellulitis of lower extremity Active Problems:   Sepsis (HCC)    Kathleen Wiley is a 78 y.o. female with history of recurrent cellulitis who I saw recently for admission for bilateral cellulitis after pedicure now readmitted with right lower extremity cellulitis she had the cefadroxil  on hand to initiate at the first signs of this but did not start it and it seems as if her cellulitis progressed rapidly.  On admission she was placed on cefepime  and clindamycin  and developed a diffuse rash.  We have separately changed her to cefazolin   #1 current cellulitis: Continues cefazolin   Elevate leg if possible with pillows Have told her if the reactive strategy we tried to employ does not work she may simply just  need to be on antibiotics chronically to help suppress and prevent her recurrent episodes of cellulitis.  Every episode she has increases her chances of having another 1 with the damage that is done to the lymphatics.  #2  Drug rash likely due to cefepime  versus less likely clindamycin .  Fortunately she is tolerating cefazolin  without trouble  #3 MDS with TTPenia: I believe the thrombocytopenia likely contributes to some of the appearance of her right lower extremity with some petechial type areas observed  I have personally spent 50 minutes involved in face-to-face and non-face-to-face activities for this patient on the day of the visit. Professional time spent includes the following activities: Preparing to see the patient (review of tests), Obtaining and/or reviewing separately obtained history (admission/discharge record), Performing a medically appropriate examination and/or evaluation , Ordering medications/tests/procedures, referring and communicating with other health care professionals, Documenting clinical information in the EMR, Independently interpreting results (not separately reported), Communicating results to the patient/family/caregiver, Counseling and educating the patient/family/caregiver and Care coordination (not separately reported).   Evaluation of the patient requires complex antimicrobial  therapy evaluation, counseling , isolation needs to reduce disease transmission and risk assessment and mitigation.   I will check in on her again on Monday.    LOS: 2 days   Jomarie Fleeta Rothman 07/07/2024, 11:44 AM

## 2024-07-07 NOTE — Plan of Care (Signed)
  Problem: Education: Goal: Knowledge of General Education information will improve Description: Including pain rating scale, medication(s)/side effects and non-pharmacologic comfort measures Outcome: Progressing   Problem: Health Behavior/Discharge Planning: Goal: Ability to manage health-related needs will improve Outcome: Progressing   Problem: Activity: Goal: Risk for activity intolerance will decrease Outcome: Progressing   Problem: Nutrition: Goal: Adequate nutrition will be maintained Outcome: Progressing   Problem: Coping: Goal: Level of anxiety will decrease Outcome: Progressing   Problem: Elimination: Goal: Will not experience complications related to bowel motility Outcome: Progressing   Problem: Pain Managment: Goal: General experience of comfort will improve and/or be controlled Outcome: Progressing   Problem: Safety: Goal: Ability to remain free from injury will improve Outcome: Progressing

## 2024-07-08 DIAGNOSIS — L03119 Cellulitis of unspecified part of limb: Secondary | ICD-10-CM | POA: Diagnosis not present

## 2024-07-08 DIAGNOSIS — E1140 Type 2 diabetes mellitus with diabetic neuropathy, unspecified: Secondary | ICD-10-CM | POA: Diagnosis not present

## 2024-07-08 DIAGNOSIS — I5031 Acute diastolic (congestive) heart failure: Secondary | ICD-10-CM | POA: Diagnosis not present

## 2024-07-08 LAB — BASIC METABOLIC PANEL WITH GFR
Anion gap: 6 (ref 5–15)
BUN: 13 mg/dL (ref 8–23)
CO2: 26 mmol/L (ref 22–32)
Calcium: 7.6 mg/dL — ABNORMAL LOW (ref 8.9–10.3)
Chloride: 106 mmol/L (ref 98–111)
Creatinine, Ser: 0.88 mg/dL (ref 0.44–1.00)
GFR, Estimated: >60 mL/min (ref >60)
Glucose, Bld: 109 mg/dL — ABNORMAL HIGH (ref 70–99)
Potassium: 3.9 mmol/L (ref 3.5–5.1)
Sodium: 138 mmol/L (ref 135–145)

## 2024-07-08 LAB — CBC
HCT: 27.7 % — ABNORMAL LOW (ref 36.0–46.0)
Hemoglobin: 9 g/dL — ABNORMAL LOW (ref 12.0–15.0)
MCH: 31.8 pg (ref 26.0–34.0)
MCHC: 32.5 g/dL (ref 30.0–36.0)
MCV: 97.9 fL (ref 80.0–100.0)
Platelets: 94 K/uL — ABNORMAL LOW (ref 150–400)
RBC: 2.83 MIL/uL — ABNORMAL LOW (ref 3.87–5.11)
RDW: 15.9 % — ABNORMAL HIGH (ref 11.5–15.5)
WBC: 2.4 K/uL — ABNORMAL LOW (ref 4.0–10.5)
nRBC: 0 % (ref 0.0–0.2)

## 2024-07-08 LAB — PHOSPHORUS: Phosphorus: 2.4 mg/dL — ABNORMAL LOW (ref 2.5–4.6)

## 2024-07-08 LAB — MAGNESIUM: Magnesium: 2.4 mg/dL (ref 1.7–2.4)

## 2024-07-08 MED ORDER — CEFADROXIL 500 MG PO CAPS
500.0000 mg | ORAL_CAPSULE | Freq: Two times a day (BID) | ORAL | 0 refills | Status: DC
Start: 2024-07-08 — End: 2024-07-23

## 2024-07-08 MED ORDER — POTASSIUM PHOSPHATES 15 MMOLE/5ML IV SOLN
15.0000 mmol | Freq: Once | INTRAVENOUS | Status: AC
  Administered 2024-07-08: 15 mmol via INTRAVENOUS
  Filled 2024-07-08: qty 5

## 2024-07-08 NOTE — Discharge Summary (Signed)
 Physician Discharge Summary  SHARIYA GASTER FMW:[id number] DOB: May 10, 1946 DOA: 07/05/2024  PCP: Dyane Anthony RAMAN, FNP  Admit date: 07/05/2024 Discharge date: 07/08/2024  Admitted From: Home Disposition: Home  Recommendations for Outpatient Follow-up:  Follow up with PCP in 1-2 weeks Please obtain BMP/CBC in one week your next doctors visit.  2 weeks of cefadroxil  prescription given per ID recommendation.  Outpatient follow-up to be arranged by their service   Discharge Condition: Stable CODE STATUS: Full code Diet recommendation: Diabetic  Brief/Interim Summary: Brief Narrative:  78 year old with history of MDS with pancytopenia, paroxysmal A-fib not on anticoagulation, HTN, HLD, DM 2, recent admission due to right lower extremity cellulitis from 8/3 - 8/7 comes to the hospital with worsening right lower extremity swelling and pain.  While in the hospital patient was treated with IV Rocephin  and thereafter 10 days of p.o. cefadroxil ..  Readmitted due to recurrent infection.  Developed diffuse rash in addition to cellulitis which was suspected secondary to some form of reaction.  Antibiotics were changed from clindamycin  and cefepime  to Ancef . Eventually all had diffuse rash resolved and right lower extremity cellulitis was improving.  Cleared by infectious disease for discharge.  Advised to give 2 weeks of p.o. cefadroxil  prescription and outpatient follow-up will be arranged.   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  With petechial rash.  In the past patient has developed severe cellulitis of the right lower extremity in 2023 requiring operative debridement and VAC placement by Dr.  Harden. - Seen by ID, currently on IV cefazolin  will transition to 2 weeks of cefadroxil  per ID recommendation. - Diffuse rash has resolved    AKI stage I Baseline creatinine around 0.7, elevated to 1.33.  Prerenal in nature continue IV fluids.   Elevated T. Bili, improved Trend LFT, suspect in setting of sepsis.    MDS, pancytopenia Follows outpatient oncology/hematology Dr. Federico.  Not on any treatment at this time.  They are closely monitoring.  Paroxysmal A-fib  Not on anticoagulation, reduce metoprolol  to 12.5 mg bedtime with borderline BP   Hypertension /LE edema  Hold home Lasix   Hyperlipidemia  Continue home rosuvastatin   Diabetes type 2/neuropathy Home regimen  GERD  Sub home PPI  Mood disorder  Continue home escitalopram    Body mass index is 35.35 kg/m.  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  Wishing to go home  Examination:  General exam: Appears calm and comfortable  Respiratory system: Clear to auscultation. Respiratory effort normal. Cardiovascular system: S1 & S2 heard, RRR. No JVD, murmurs, rubs, gallops or clicks. No pedal edema. Gastrointestinal system: Abdomen is nondistended, soft and nontender. No organomegaly or masses felt. Normal bowel sounds heard. Central nervous system: Alert and oriented. No focal neurological deficits. Extremities: Symmetric 5 x 5 power. Skin: Right lower extremity erythema and warmth.  Diffuse pruritic rash including her face has significantly improved Psychiatry: Judgement and insight appear normal. Mood & affect appropriate.  Discharge Diagnoses:  Principal Problem:   Cellulitis of lower extremity Active Problems:   Sepsis (HCC)   Thrombocytopenia (HCC)   MDS  (myelodysplastic syndrome) (HCC)      Discharge Exam: Vitals:   07/08/24 0513 07/08/24 0831  BP: (!) 100/40 (!) 105/50  Pulse: 68 70  Resp: 18 17  Temp: 98.9 F (37.2 C) 99.2 F (37.3 C)  SpO2: 94% 97%   Vitals:   07/07/24 2010 07/07/24 2230 07/08/24 0513 07/08/24 0831  BP: (!) 118/49 (!) 118/49 (!) 100/40 (!) 105/50  Pulse: 72 72 68 70  Resp: 17  18 17   Temp: 99 F (37.2 C)  98.9 F (37.2 C) 99.2 F (37.3 C)  TempSrc: Oral  Oral Oral  SpO2: 92%  94% 97%  Weight:      Height:          Discharge Instructions   Allergies as of 07/08/2024       Reactions   Other    Refuses whole blood but does accept albumin    Ditropan  [oxybutynin ] Other (See Comments)   Unknown reaction   Flagyl [metronidazole] Other (See Comments)   Chest pain Induced A-fib   Lipitor [atorvastatin] Other (See Comments)   Unknown reaction   Cefepime  Rash   Has tolerated Cefazolin  + cefadroxil    Septra [sulfamethoxazole-trimethoprim] Rash   Blistering rash   Vancomycin  Itching        Medication List     STOP taking these medications    potassium chloride  10 MEQ tablet Commonly known as: KLOR-CON  M       TAKE these medications    cefadroxil  500 MG capsule Commonly known as: DURICEF Take 1 capsule (500 mg total) by mouth 2 (two) times daily for 14 days.   escitalopram  20 MG tablet Commonly known as: LEXAPRO  Take 20 mg by mouth at bedtime.   furosemide  20 MG tablet Commonly known as: LASIX  Take 20 mg by mouth daily.   gabapentin  300 MG capsule Commonly known as: NEURONTIN  Take 300 mg by mouth 2 (two) times daily.   metoprolol  succinate 25 MG 24 hr tablet Commonly known as: Toprol  XL Take 0.5 tablets (12.5 mg total) by mouth daily. What changed:  how much to take when to take this   omeprazole 20 MG capsule Commonly known as: PRILOSEC Take 20 mg by mouth daily.   potassium chloride  10 MEQ tablet Commonly known as: KLOR-CON  Take 10 mEq by mouth daily.    rosuvastatin  5 MG tablet Commonly known as: CRESTOR  Take 5 mg by mouth at bedtime.   VITAMIN B-12 PO Take 1 tablet by mouth daily.   VITAMIN D-3 PO Take 1 capsule by mouth daily.        Allergies  Allergen Reactions   Other     Refuses whole blood but does accept albumin    Ditropan  [Oxybutynin ] Other (See Comments)    Unknown reaction   Flagyl [Metronidazole] Other (See Comments)    Chest pain Induced A-fib    Lipitor [Atorvastatin] Other (See Comments)    Unknown reaction   Cefepime  Rash    Has tolerated Cefazolin  + cefadroxil    Septra [Sulfamethoxazole-Trimethoprim] Rash    Blistering rash    Vancomycin  Itching    You were cared for by a hospitalist during your hospital stay. If you have any questions about your discharge medications or the care you received while you were in the hospital after you are discharged, you can call the unit and asked to speak with the  hospitalist on call if the hospitalist that took care of you is not available. Once you are discharged, your primary care physician will handle any further medical issues. Please note that no refills for any discharge medications will be authorized once you are discharged, as it is imperative that you return to your primary care physician (or establish a relationship with a primary care physician if you do not have one) for your aftercare needs so that they can reassess your need for medications and monitor your lab values.  You were cared for by a hospitalist during your hospital stay. If you have any questions about your discharge medications or the care you received while you were in the hospital after you are discharged, you can call the unit and asked to speak with the hospitalist on call if the hospitalist that took care of you is not available. Once you are discharged, your primary care physician will handle any further medical issues. Please note that NO REFILLS for any discharge medications will be authorized  once you are discharged, as it is imperative that you return to your primary care physician (or establish a relationship with a primary care physician if you do not have one) for your aftercare needs so that they can reassess your need for medications and monitor your lab values.  Please request your Prim.MD to go over all Hospital Tests and Procedure/Radiological results at the follow up, please get all Hospital records sent to your Prim MD by signing hospital release before you go home.  Get CBC, CMP, 2 view Chest X ray checked  by Primary MD during your next visit or SNF MD in 5-7 days ( we routinely change or add medications that can affect your baseline labs and fluid status, therefore we recommend that you get the mentioned basic workup next visit with your PCP, your PCP may decide not to get them or add new tests based on their clinical decision)  On your next visit with your primary care physician please Get Medicines reviewed and adjusted.  If you experience worsening of your admission symptoms, develop shortness of breath, life threatening emergency, suicidal or homicidal thoughts you must seek medical attention immediately by calling 911 or calling your MD immediately  if symptoms less severe.  You Must read complete instructions/literature along with all the possible adverse reactions/side effects for all the Medicines you take and that have been prescribed to you. Take any new Medicines after you have completely understood and accpet all the possible adverse reactions/side effects.   Do not drive, operate heavy machinery, perform activities at heights, swimming or participation in water activities or provide baby sitting services if your were admitted for syncope or siezures until you have seen by Primary MD or a Neurologist and advised to do so again.  Do not drive when taking Pain medications.   Procedures/Studies: DG Tibia/Fibula Right Result Date: 07/05/2024 CLINICAL DATA:   Cellulitis EXAM: RIGHT TIBIA AND FIBULA - 2 VIEW COMPARISON:  06/12/2024 FINDINGS: Knee replacement with normal alignment. No fracture or malalignment. No osseous destructive change. Soft tissue edema without emphysema. IMPRESSION: Soft tissue edema without emphysema. No acute osseous abnormality. Electronically Signed   By: Luke Bun M.D.   On: 07/05/2024 21:54   DG Foot Complete Right Result Date: 07/05/2024 CLINICAL DATA:  ?  Osteomyelitis of the right 2nd toe EXAM: RIGHT FOOT COMPLETE - 3+ VIEW COMPARISON:  May 06, 2023 FINDINGS: Redemonstrated severe hallux valgus deformity of the first digit. Postsurgical changes  of the first and second metatarsals and the proximal phalanx of the great toe. No acute fracture or dislocation. Bony ankylosis across the 2nd-4th proximal interphalangeal joints. Soft tissue swelling about the second digit with a small amount of subcutaneous gas along the plantar surface. Moderate degenerative changes of the midfoot. IMPRESSION: Soft tissue swelling about the second digit with a small amount of subcutaneous gas along the plantar surface, possibly due to gas producing soft tissue infection or ulceration. No radiographic findings of osteomyelitis, at this time. Electronically Signed   By: Rogelia Myers M.D.   On: 07/05/2024 19:18   DG Chest Portable 1 View Result Date: 07/05/2024 CLINICAL DATA:  Shortness of breath EXAM: PORTABLE CHEST 1 VIEW COMPARISON:  Chest radiograph June 10, 2024 FINDINGS: Mildly enlarged enlarged cardiomediastinal silhouette. Both lungs are clear. No new consolidation. No pleural effusion or pneumothorax. The visualized skeletal structures are unremarkable. IMPRESSION: Stable mild cardiomegaly. No new nodule. Electronically Signed   By: Megan  Zare M.D.   On: 07/05/2024 19:15   CT TIBIA FIBULA RIGHT W CONTRAST Result Date: 06/12/2024 CLINICAL DATA:  Soft tissue infection suspected, lower leg, xray done EXAM: CT OF THE LOWER RIGHT EXTREMITY WITH  CONTRAST TECHNIQUE: Multidetector CT imaging of the lower right extremity was performed according to the standard protocol following intravenous contrast administration. RADIATION DOSE REDUCTION: This exam was performed according to the departmental dose-optimization program which includes automated exposure control, adjustment of the mA and/or kV according to patient size and/or use of iterative reconstruction technique. CONTRAST:  75mL OMNIPAQUE  IOHEXOL  350 MG/ML SOLN COMPARISON:  Right tibia and fibula radiographs dated 05/03/2022. FINDINGS: Bones/Joint/Cartilage No acute fracture or dislocation. Status post right total knee arthroplasty. Hardware appears well seated with appropriate alignment. Postoperative changes of the distal first metatarsal and first proximal phalanx related to prior bunionectomy. Remote osteotomy of the second metatarsal head. 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 Mild degenerative arthropathy of the forefoot. Ligaments Ligaments are suboptimally evaluated by CT. Muscles and Tendons Atrophy of the intrinsic foot musculature may reflect chronic denervation changes. Muscles are otherwise within normal limits. No intramuscular fluid collection. Visualized tendons are intact. Soft tissue Generalized subcutaneous edema extending from the proximal calf through the dorsal foot with overlying cutaneous thickening. No loculated fluid collection. No soft tissue gas. No soft tissue mass. IMPRESSION: 1. Generalized subcutaneous edema extending from the proximal calf through the dorsal foot with overlying cutaneous thickening, which could reflect cellulitis in the appropriate clinical setting. No loculated fluid collection. No soft tissue gas. 2. No acute osseous abnormality. 3. Intact right total knee arthroplasty. 4. Postoperative changes related to prior bunionectomy and osteotomy of the second metatarsal head. 5. Mild-to-moderate  degenerative arthropathy throughout the foot. Electronically Signed   By: Harrietta Sherry M.D.   On: 06/12/2024 12:25   VAS US  LOWER EXTREMITY VENOUS (DVT) (ONLY [facility] & [facility]) Result Date: 06/10/2024  Lower Venous DVT Study Patient Name:  Idalia J Hottle  Date of Exam:   06/10/2024 Medical Rec #: [id number]        Accession #:    [id number] Date of Birth: 09/15/1946        Patient Gender: F Patient Age:   78 years Exam Location:  [facility] Procedure:      VAS US  LOWER EXTREMITY VENOUS (DVT) Referring Phys: CHASE COUNTRYMAN --------------------------------------------------------------------------------  Indications: Swelling, Pain, and redness in calf.  Comparison Study: 04/21/19 - Negative Performing Technologist: Ricka Sturdivant-Jones RDMS, RVT  Examination  Guidelines: A complete evaluation includes B-mode imaging, spectral Doppler, color Doppler, and power Doppler as needed of all accessible portions of each vessel. Bilateral testing is considered an integral part of a complete examination. Limited examinations for reoccurring indications may be performed as noted. The reflux portion of the exam is performed with the patient in reverse Trendelenburg.  +---------+---------------+---------+-----------+----------+--------------+ RIGHT    CompressibilityPhasicitySpontaneityPropertiesThrombus Aging +---------+---------------+---------+-----------+----------+--------------+ CFV      Full           Yes      Yes                                 +---------+---------------+---------+-----------+----------+--------------+ SFJ      Full                                                        +---------+---------------+---------+-----------+----------+--------------+ FV Prox  Full                                                        +---------+---------------+---------+-----------+----------+--------------+ FV Mid   Full                                                         +---------+---------------+---------+-----------+----------+--------------+ FV DistalFull                                                        +---------+---------------+---------+-----------+----------+--------------+ PFV      Full                                                        +---------+---------------+---------+-----------+----------+--------------+ POP      Full           Yes      Yes                                 +---------+---------------+---------+-----------+----------+--------------+ PTV      Full                                                        +---------+---------------+---------+-----------+----------+--------------+ PERO     Full                                                        +---------+---------------+---------+-----------+----------+--------------+   +----+---------------+---------+-----------+----------+--------------+  LEFTCompressibilityPhasicitySpontaneityPropertiesThrombus Aging +----+---------------+---------+-----------+----------+--------------+ CFV Full           Yes      Yes                                 +----+---------------+---------+-----------+----------+--------------+ SFJ Full                                                        +----+---------------+---------+-----------+----------+--------------+     Summary: RIGHT: - There is no evidence of deep vein thrombosis in the lower extremity.  - No cystic structure found in the popliteal fossa.  LEFT: - No evidence of common femoral vein obstruction.   *See table(s) above for measurements and observations. Electronically signed by Debby Robertson on 06/10/2024 at 3:24:34 PM.    Final    DG Chest Portable 1 View Result Date: 06/10/2024 CLINICAL DATA:  78 year old female with chest pain and weakness. Hospitalized for meningitis in June. EXAM: PORTABLE CHEST 1 VIEW COMPARISON:  Portable chest 04/19/2022 and earlier. FINDINGS: Portable AP view at 0554 hours.  Larger, improved lung volumes. Stable cardiomegaly and mediastinal contours. Visualized tracheal air column is within normal limits. Allowing for portable technique the lungs are clear. No pneumothorax. No pleural effusion is evident. No acute osseous abnormality identified. IMPRESSION: Stable cardiomegaly. No acute cardiopulmonary abnormality. Electronically Signed   By: VEAR Hurst M.D.   On: 06/10/2024 06:37   CT HEAD WO CONTRAST ( ) Result Date: 06/10/2024 CLINICAL DATA:  78 year old female with weakness. Hospitalized for meningitis in June. EXAM: CT HEAD WITHOUT CONTRAST TECHNIQUE: Contiguous axial images were obtained from the base of the skull through the vertex without intravenous contrast. RADIATION DOSE REDUCTION: This exam was performed according to the departmental dose-optimization program which includes automated exposure control, adjustment of the mA and/or kV according to patient size and/or use of iterative reconstruction technique. COMPARISON:  Head CT 04/20/2022. FINDINGS: Brain: Cerebral volume remains normal for age. No midline shift, ventriculomegaly, mass effect, evidence of mass lesion, intracranial hemorrhage or evidence of cortically based acute infarction. Gray-white differentiation stable and normal for age. No cortical encephalomalacia identified. Minimal cerebral white matter hypodensity most apparent in the left frontal lobe and stable. Normal basilar cisterns. Vascular: Calcified atherosclerosis at the skull base. No suspicious intracranial vascular hyperdensity. Skull: Stable and intact. Sinuses/Orbits: Chronic right maxillary sinusitis with mucoperiosteal thickening unchanged. Other sinuses, tympanic cavities and mastoids remain well aerated. Other: No acute orbit or scalp soft tissue finding. IMPRESSION: 1. Stable since 2023 and negative for age noncontrast CT appearance of the brain. 2. Chronic right maxillary sinusitis. Electronically Signed   By: VEAR Hurst M.D.   On: 06/10/2024  06:37     The results of significant diagnostics from this hospitalization (including imaging, microbiology, ancillary and laboratory) are listed below for reference.     Microbiology: Recent Results (from the past 240 hours)  Blood culture (routine x 2)     Status: None (Preliminary result)   Collection Time: 07/05/24  8:50 PM   Specimen: BLOOD  Result Value Ref Range Status   Specimen Description BLOOD SITE NOT SPECIFIED  Final   Special Requests   Final    BOTTLES DRAWN AEROBIC AND ANAEROBIC Blood Culture adequate volume   Culture   Final  NO GROWTH 3 DAYS Performed at [facility], [address], [place], [place] [address]    Report Status PENDING  Incomplete  Blood culture (routine x 2)     Status: None (Preliminary result)   Collection Time: 07/06/24 12:06 AM   Specimen: BLOOD LEFT ARM  Result Value Ref Range Status   Specimen Description BLOOD LEFT ARM  Final   Special Requests   Final    BOTTLES DRAWN AEROBIC AND ANAEROBIC Blood Culture results may not be optimal due to an inadequate volume of blood received in culture bottles   Culture   Final    NO GROWTH 2 DAYS Performed at [facility], [address], [place], [place] [address]    Report Status PENDING  Incomplete     Labs: BNP (last 3 results) No results for input(s): BNP in the last 8760 hours. Basic Metabolic Panel: Recent Labs  Lab 07/05/24 1820 07/06/24 0510 07/08/24 0323  NA 137 137 138  K 3.6 3.6 3.9  CL 101 101 106  CO2 22 22 26   GLUCOSE 184* 133* 109*  BUN 19 26* 13  CREATININE 1.33* 1.11* 0.88  CALCIUM  8.7* 8.1* 7.6*  MG  --  1.6* 2.4  PHOS  --  4.5 2.4*   Liver Function Tests: Recent Labs  Lab 07/05/24 1820 07/06/24 0510  AST 43* 28  ALT 28 21  ALKPHOS 68 53  BILITOT 2.4* 1.8*  PROT 6.9 5.6*  ALBUMIN  3.5 2.7*   Recent Labs  Lab 07/05/24 1820  LIPASE 28   No results for input(s): AMMONIA in the last 168 hours. CBC: Recent Labs  Lab 07/05/24 1710  07/06/24 0510 07/08/24 0323  WBC 4.8 3.5* 2.4*  HGB 12.9 10.6* 9.0*  HCT 38.4 32.1* 27.7*  MCV 97.2 98.5 97.9  PLT 110* 82* 94*   Cardiac Enzymes: No results for input(s): CKTOTAL, CKMB, CKMBINDEX, TROPONINI in the last 168 hours. BNP: Invalid input(s): POCBNP CBG: No results for input(s): GLUCAP in the last 168 hours. D-Dimer No results for input(s): DDIMER in the last 72 hours. Hgb A1c No results for input(s): HGBA1C in the last 72 hours. Lipid Profile No results for input(s): CHOL, HDL, LDLCALC, TRIG, CHOLHDL, LDLDIRECT in the last 72 hours. Thyroid  function studies No results for input(s): TSH, T4TOTAL, T3FREE, THYROIDAB in the last 72 hours.  Invalid input(s): FREET3 Anemia work up No results for input(s): VITAMINB12, FOLATE, FERRITIN, TIBC, IRON, RETICCTPCT in the last 72 hours. Urinalysis    Component Value Date/Time   COLORURINE AMBER (A) 06/10/2024 0530   APPEARANCEUR HAZY (A) 06/10/2024 0530   LABSPEC 1.019 06/10/2024 0530   PHURINE 5.0 06/10/2024 0530   GLUCOSEU NEGATIVE 06/10/2024 0530   HGBUR SMALL (A) 06/10/2024 0530   BILIRUBINUR NEGATIVE 06/10/2024 0530   KETONESUR NEGATIVE 06/10/2024 0530   PROTEINUR 30 (A) 06/10/2024 0530   UROBILINOGEN 1.0 08/06/2012 2046   NITRITE NEGATIVE 06/10/2024 0530   LEUKOCYTESUR NEGATIVE 06/10/2024 0530   Sepsis Labs Recent Labs  Lab 07/05/24 1710 07/06/24 0510 07/08/24 0323  WBC 4.8 3.5* 2.4*   Microbiology Recent Results (from the past 240 hours)  Blood culture (routine x 2)     Status: None (Preliminary result)   Collection Time: 07/05/24  8:50 PM   Specimen: BLOOD  Result Value Ref Range Status   Specimen Description BLOOD SITE NOT SPECIFIED  Final   Special Requests   Final    BOTTLES DRAWN AEROBIC AND ANAEROBIC Blood Culture adequate volume  Culture   Final    NO GROWTH 3 DAYS Performed at [facility], [address], [place], [place] [address]     Report Status PENDING  Incomplete  Blood culture (routine x 2)     Status: None (Preliminary result)   Collection Time: 07/06/24 12:06 AM   Specimen: BLOOD LEFT ARM  Result Value Ref Range Status   Specimen Description BLOOD LEFT ARM  Final   Special Requests   Final    BOTTLES DRAWN AEROBIC AND ANAEROBIC Blood Culture results may not be optimal due to an inadequate volume of blood received in culture bottles   Culture   Final    NO GROWTH 2 DAYS Performed at [facility], [address] [place], [place] [address]    Report Status PENDING  Incomplete     Time coordinating discharge:  I have spent 35 minutes face to face with the patient and on the ward discussing the patients care, assessment, plan and disposition with other care givers. >50% of the time was devoted counseling the patient about the risks and benefits of treatment/Discharge disposition and coordinating care.   SIGNED:   Burgess JAYSON Dare, MD  [facility] 07/08/2024, 12:59 PM   If 7PM-7AM, please contact night-coverage

## 2024-07-08 NOTE — Plan of Care (Signed)

## 2024-07-08 NOTE — Progress Notes (Signed)
 PROGRESS NOTE    Kathleen Wiley  FMW:[id number] DOB: 03-16-1946 DOA: 07/05/2024 PCP: Dyane Anthony RAMAN, FNP    Brief Narrative:  78 year old with history of MDS with pancytopenia, paroxysmal A-fib not on anticoagulation, HTN, HLD, DM 2, recent admission due to right lower extremity cellulitis from 8/3 - 8/7 comes to the hospital with worsening right lower extremity swelling and pain.  While in the hospital patient was treated with IV Rocephin  and thereafter 10 days of p.o. cefadroxil ..  Readmitted due to recurrent infection.  Developed diffuse rash in addition to cellulitis which was suspected secondary to some form of reaction.  Antibiotics were changed from clindamycin  and cefepime  to Ancef .   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  With petechial rash.  In the past patient has developed severe cellulitis of the right lower extremity in 2023 requiring operative debridement and VAC placement by Dr. Harden. - Seen by ID, currently on IV cefazolin   - antihistamines added    AKI stage I Baseline creatinine around 0.7, elevated to 1.33.  Prerenal in nature continue IV fluids.   Elevated T. Bili, improved Trend LFT, suspect in setting of sepsis.    MDS, pancytopenia Follows outpatient oncology/hematology Dr. Federico.  Not on any treatment at this time.  They are closely monitoring.  Paroxysmal A-fib  Not on anticoagulation, reduce metoprolol  to 12.5 mg bedtime with borderline BP   Hypertension /LE edema  Hold home Lasix   Hyperlipidemia  Continue home rosuvastatin   Diabetes type 2/neuropathy Holding home metformin.  SSI for very sensitive.  Continue home gabapentin   GERD  Sub home PPI  Mood  disorder  Continue home escitalopram    Body mass index is 35.35 kg/m.  Obesity class II would benefit from weight loss outpatient   DVT prophylaxis:  SQ Heparin        Code Status: Full Code Family Communication:   Status is: Inpatient Remains inpatient appropriate because: Cont hosp stay for IV Abx.    PT Follow up Recs:   Subjective:  Seen at bedside.  Facial, arm and torso rash is even better today Improved pruritus  Examination:  General exam: Appears calm and comfortable  Respiratory system: Clear to auscultation. Respiratory effort normal. Cardiovascular system: S1 & S2 heard, RRR. No JVD, murmurs, rubs, gallops or clicks. No pedal edema. Gastrointestinal system: Abdomen is nondistended, soft and nontender. No organomegaly or masses felt. Normal bowel sounds heard. Central nervous system: Alert and oriented. No focal neurological deficits. Extremities: Symmetric 5 x 5 power. Skin: Right lower extremity erythema and warmth.  Diffuse pruritic rash including her face has significantly improved Psychiatry: Judgement and insight appear normal. Mood & affect appropriate.                Diet Orders (From admission, onward)     Start     Ordered   07/05/24 2120  Diet Carb Modified Fluid consistency: Thin; Room service appropriate? Yes  Diet effective now       Question Answer Comment  Diet-HS Snack? Nothing   Calorie Level Medium 1600-2000   Fluid consistency: Thin   Room service appropriate? Yes      07/05/24 2119  Objective: Vitals:   07/07/24 2010 07/07/24 2230 07/08/24 0513 07/08/24 0831  BP: (!) 118/49 (!) 118/49 (!) 100/40 (!) 105/50  Pulse: 72 72 68 70  Resp: 17  18 17   Temp: 99 F (37.2 C)  98.9 F (37.2 C) 99.2 F (37.3 C)  TempSrc: Oral  Oral Oral  SpO2: 92%  94% 97%  Weight:      Height:        Intake/Output Summary (Last 24 hours) at 07/08/2024 0949 Last data filed at 07/08/2024 0525 Gross per 24 hour  Intake 1887.81 ml   Output --  Net 1887.81 ml   Filed Weights   07/05/24 1657  Weight: 99.3 kg    Scheduled Meds:  escitalopram   20 mg Oral QHS   famotidine   20 mg Oral BID   gabapentin   300 mg Oral BID   heparin   5,000 Units Subcutaneous Q8H   metoprolol  succinate  12.5 mg Oral QHS   pantoprazole   40 mg Oral Daily   rosuvastatin   5 mg Oral QHS   sodium chloride  flush  3 mL Intravenous Q12H   Continuous Infusions:   ceFAZolin  (ANCEF ) IV 2 g (07/08/24 0512)   potassium PHOSPHATE  IVPB (in mmol) 15 mmol (07/08/24 0936)    Nutritional status     Body mass index is 35.35 kg/m.  Data Reviewed:   CBC: Recent Labs  Lab 07/05/24 1710 07/06/24 0510 07/08/24 0323  WBC 4.8 3.5* 2.4*  HGB 12.9 10.6* 9.0*  HCT 38.4 32.1* 27.7*  MCV 97.2 98.5 97.9  PLT 110* 82* 94*   Basic Metabolic Panel: Recent Labs  Lab 07/05/24 1820 07/06/24 0510 07/08/24 0323  NA 137 137 138  K 3.6 3.6 3.9  CL 101 101 106  CO2 22 22 26   GLUCOSE 184* 133* 109*  BUN 19 26* 13  CREATININE 1.33* 1.11* 0.88  CALCIUM  8.7* 8.1* 7.6*  MG  --  1.6* 2.4  PHOS  --  4.5 2.4*   GFR: Estimated Creatinine Clearance: 63.6 mL/min (by C-G formula based on SCr of 0.88 mg/dL). Liver Function Tests: Recent Labs  Lab 07/05/24 1820 07/06/24 0510  AST 43* 28  ALT 28 21  ALKPHOS 68 53  BILITOT 2.4* 1.8*  PROT 6.9 5.6*  ALBUMIN  3.5 2.7*   Recent Labs  Lab 07/05/24 1820  LIPASE 28   No results for input(s): AMMONIA in the last 168 hours. Coagulation Profile: No results for input(s): INR, PROTIME in the last 168 hours. Cardiac Enzymes: No results for input(s): CKTOTAL, CKMB, CKMBINDEX, TROPONINI in the last 168 hours. BNP (last 3 results) No results for input(s): PROBNP in the last 8760 hours. HbA1C: No results for input(s): HGBA1C in the last 72 hours. CBG: No results for input(s): GLUCAP in the last 168 hours. Lipid Profile: No results for input(s): CHOL, HDL, LDLCALC, TRIG, CHOLHDL,  LDLDIRECT in the last 72 hours. Thyroid  Function Tests: No results for input(s): TSH, T4TOTAL, FREET4, T3FREE, THYROIDAB in the last 72 hours. Anemia Panel: No results for input(s): VITAMINB12, FOLATE, FERRITIN, TIBC, IRON, RETICCTPCT in the last 72 hours. Sepsis Labs: Recent Labs  Lab 07/05/24 1717  LATICACIDVEN 4.5*    Recent Results (from the past 240 hours)  Blood culture (routine x 2)     Status: None (Preliminary result)   Collection Time: 07/05/24  8:50 PM   Specimen: BLOOD  Result Value Ref Range Status   Specimen Description BLOOD SITE NOT SPECIFIED  Final   Special Requests   Final  BOTTLES DRAWN AEROBIC AND ANAEROBIC Blood Culture adequate volume   Culture   Final    NO GROWTH 3 DAYS Performed at [facility], [address] [place], [place] [address]    Report Status PENDING  Incomplete  Blood culture (routine x 2)     Status: None (Preliminary result)   Collection Time: 07/06/24 12:06 AM   Specimen: BLOOD LEFT ARM  Result Value Ref Range Status   Specimen Description BLOOD LEFT ARM  Final   Special Requests   Final    BOTTLES DRAWN AEROBIC AND ANAEROBIC Blood Culture results may not be optimal due to an inadequate volume of blood received in culture bottles   Culture   Final    NO GROWTH 2 DAYS Performed at [facility], [address] [place], [place] [address]    Report Status PENDING  Incomplete         Radiology Studies: No results found.         LOS: 3 days   Time spent= 35 mins    Burgess JAYSON Dare, MD [facility]  If 7PM-7AM, please contact night-coverage  07/08/2024, 9:49 AM

## 2024-07-08 NOTE — Progress Notes (Signed)
 Subjective: He is feeling dramatically better   Antibiotics:  Anti-infectives (From admission, onward)    Start     Dose/Rate Route Frequency Ordered Stop   07/06/24 1400  ceFAZolin  (ANCEF ) IVPB 2g/100 mL premix        2 g 200 mL/hr over 30 Minutes Intravenous Every 8 hours 07/06/24 0958     07/06/24 0630  ceFEPIme  (MAXIPIME ) 2 g in sodium chloride  0.9 % 100 mL IVPB  Status:  Discontinued        2 g 200 mL/hr over 30 Minutes Intravenous Every 12 hours 07/05/24 2150 07/06/24 0958   07/06/24 0330  clindamycin  (CLEOCIN ) IVPB 600 mg  Status:  Discontinued        600 mg 100 mL/hr over 30 Minutes Intravenous Every 8 hours 07/05/24 2150 07/06/24 0958   07/05/24 2200  linezolid  (ZYVOX ) IVPB 600 mg  Status:  Discontinued        600 mg 300 mL/hr over 60 Minutes Intravenous Every 12 hours 07/05/24 2107 07/05/24 2108   07/05/24 2200  linezolid  (ZYVOX ) IVPB 600 mg  Status:  Discontinued        600 mg 300 mL/hr over 60 Minutes Intravenous Every 12 hours 07/05/24 2108 07/05/24 2117   07/05/24 1930  clindamycin  (CLEOCIN ) IVPB 600 mg        600 mg 100 mL/hr over 30 Minutes Intravenous  Once 07/05/24 1928 07/05/24 2203   07/05/24 1830  vancomycin  (VANCOCIN ) IVPB 1000 mg/200 mL premix  Status:  Discontinued        1,000 mg 200 mL/hr over 60 Minutes Intravenous  Once 07/05/24 1824 07/05/24 1826   07/05/24 1830  ceFEPIme  (MAXIPIME ) 2 g in sodium chloride  0.9 % 100 mL IVPB        2 g 200 mL/hr over 30 Minutes Intravenous  Once 07/05/24 1824 07/05/24 2021   07/05/24 1830  vancomycin  (VANCOREADY) IVPB 1500 mg/300 mL  Status:  Discontinued        1,500 mg 150 mL/hr over 120 Minutes Intravenous  Once 07/05/24 1826 07/05/24 2107       Medications: Scheduled Meds:  escitalopram   20 mg Oral QHS   famotidine   20 mg Oral BID   gabapentin   300 mg Oral BID   heparin   5,000 Units Subcutaneous Q8H   metoprolol  succinate  12.5 mg Oral QHS   pantoprazole   40 mg Oral Daily   rosuvastatin   5 mg  Oral QHS   sodium chloride  flush  3 mL Intravenous Q12H   Continuous Infusions:   ceFAZolin  (ANCEF ) IV 2 g (07/08/24 0512)   potassium PHOSPHATE  IVPB (in mmol) 15 mmol (07/08/24 0936)   PRN Meds:.acetaminophen , diphenhydrAMINE , hydrALAZINE , ipratropium-albuterol , melatonin, metoprolol  tartrate, ondansetron  (ZOFRAN ) IV, polyethylene glycol    Objective: Weight change:   Intake/Output Summary (Last 24 hours) at 07/08/2024 1247 Last data filed at 07/08/2024 0525 Gross per 24 hour  Intake 1887.81 ml  Output --  Net 1887.81 ml   Blood pressure (!) 105/50, pulse 70, temperature 99.2 F (37.3 C), temperature source Oral, resp. rate 17, height 5' 6 (1.676 m), weight 99.3 kg, SpO2 97%. Temp:  [98.9 F (37.2 C)-99.5 F (37.5 C)] 99.2 F (37.3 C) (08/31 0831) Pulse Rate:  [68-72] 70 (08/31 0831) Resp:  [17-18] 17 (08/31 0831) BP: (100-118)/(40-50) 105/50 (08/31 0831) SpO2:  [92 %-100 %] 97 % (08/31 0831)  Physical Exam: Physical Exam Constitutional:      General: She is not in acute distress.  Appearance: She is well-developed. She is not diaphoretic.  HENT:     Head: Normocephalic and atraumatic.     Right Ear: External ear normal.     Left Ear: External ear normal.     Mouth/Throat:     Pharynx: No oropharyngeal exudate.  Eyes:     General: No scleral icterus.    Conjunctiva/sclera: Conjunctivae normal.     Pupils: Pupils are equal, round, and reactive to light.  Cardiovascular:     Rate and Rhythm: Normal rate and regular rhythm.  Pulmonary:     Effort: Pulmonary effort is normal. No respiratory distress.     Breath sounds: Normal breath sounds. No wheezing.  Abdominal:     General: Bowel sounds are normal. There is no distension.     Palpations: Abdomen is soft.     Tenderness: There is no abdominal tenderness. There is no rebound.  Musculoskeletal:        General: No tenderness. Normal range of motion.  Lymphadenopathy:     Cervical: No cervical adenopathy.   Skin:    General: Skin is warm and dry.     Coloration: Skin is not pale.     Findings: Erythema present. No rash.  Neurological:     General: No focal deficit present.     Mental Status: She is alert and oriented to person, place, and time.     Motor: No abnormal muscle tone.     Coordination: Coordination normal.  Psychiatric:        Mood and Affect: Mood normal.        Behavior: Behavior normal.        Thought Content: Thought content normal.        Judgment: Judgment normal.     RLE 8/29    RLE  8/30     07/08/2024:     Rash 8/29       Rash 8/30    CBC:    BMET Recent Labs    07/06/24 0510 07/08/24 0323  NA 137 138  K 3.6 3.9  CL 101 106  CO2 22 26  GLUCOSE 133* 109*  BUN 26* 13  CREATININE 1.11* 0.88  CALCIUM  8.1* 7.6*     Liver Panel  Recent Labs    07/05/24 1820 07/06/24 0510  PROT 6.9 5.6*  ALBUMIN  3.5 2.7*  AST 43* 28  ALT 28 21  ALKPHOS 68 53  BILITOT 2.4* 1.8*  BILIDIR  --  0.6*  IBILI  --  1.2*       Sedimentation Rate Recent Labs    07/05/24 1756  ESRSEDRATE 15   C-Reactive Protein Recent Labs    07/05/24 1756  CRP 3.5*    Micro Results: Recent Results (from the past 720 hours)  Resp panel by RT-PCR (RSV, Flu A&B, Covid) Anterior Nasal Swab     Status: None   Collection Time: 06/10/24  6:00 AM   Specimen: Anterior Nasal Swab  Result Value Ref Range Status   SARS Coronavirus 2 by RT PCR NEGATIVE NEGATIVE Final   Influenza A by PCR NEGATIVE NEGATIVE Final   Influenza B by PCR NEGATIVE NEGATIVE Final    Comment: (NOTE) The Xpert Xpress SARS-CoV-2/FLU/RSV plus assay is intended as an aid in the diagnosis of influenza from Nasopharyngeal swab specimens and should not be used as a sole basis for treatment. Nasal washings and aspirates are unacceptable for Xpert Xpress SARS-CoV-2/FLU/RSV testing.  Fact Sheet for Patients: BloggerCourse.com  Fact Sheet for Healthcare  Providers: SeriousBroker.it  This test is not yet approved or cleared by the [place]  FDA and has been authorized for detection and/or diagnosis of SARS-CoV-2 by FDA under an Emergency Use Authorization (EUA). This EUA will remain in effect (meaning this test can be used) for the duration of the COVID-19 declaration under Section 564(b)(1) of the Act, 21 U.S.C. section 360bbb-3(b)(1), unless the authorization is terminated or revoked.     Resp Syncytial Virus by PCR NEGATIVE NEGATIVE Final    Comment: (NOTE) Fact Sheet for Patients: BloggerCourse.com  Fact Sheet for Healthcare Providers: SeriousBroker.it  This test is not yet approved or cleared by the [place]  FDA and has been authorized for detection and/or diagnosis of SARS-CoV-2 by FDA under an Emergency Use Authorization (EUA). This EUA will remain in effect (meaning this test can be used) for the duration of the COVID-19 declaration under Section 564(b)(1) of the Act, 21 U.S.C. section 360bbb-3(b)(1), unless the authorization is terminated or revoked.  Performed at [facility], [address], [place], [place] [address]   Blood culture (routine x 2)     Status: None   Collection Time: 06/10/24  6:47 AM   Specimen: BLOOD LEFT FOREARM  Result Value Ref Range Status   Specimen Description BLOOD LEFT FOREARM  Final   Special Requests   Final    BOTTLES DRAWN AEROBIC AND ANAEROBIC Blood Culture results may not be optimal due to an inadequate volume of blood received in culture bottles   Culture   Final    NO GROWTH 5 DAYS Performed at [facility], [address], [place], [place] [address]    Report Status 06/15/2024 FINAL  Final  Blood culture (routine x 2)     Status: None   Collection Time: 06/10/24  6:52 AM   Specimen: BLOOD RIGHT ARM  Result Value Ref Range Status   Specimen Description BLOOD RIGHT ARM  Final    Special Requests   Final    BOTTLES DRAWN AEROBIC ONLY Blood Culture results may not be optimal due to an inadequate volume of blood received in culture bottles   Culture   Final    NO GROWTH 5 DAYS Performed at [facility], [address] [place], [place] [address]    Report Status 06/15/2024 FINAL  Final  Blood culture (routine x 2)     Status: None (Preliminary result)   Collection Time: 07/05/24  8:50 PM   Specimen: BLOOD  Result Value Ref Range Status   Specimen Description BLOOD SITE NOT SPECIFIED  Final   Special Requests   Final    BOTTLES DRAWN AEROBIC AND ANAEROBIC Blood Culture adequate volume   Culture   Final    NO GROWTH 3 DAYS Performed at [facility], [address] [place], [place] [address]    Report Status PENDING  Incomplete  Blood culture (routine x 2)     Status: None (Preliminary result)   Collection Time: 07/06/24 12:06 AM   Specimen: BLOOD LEFT ARM  Result Value Ref Range Status   Specimen Description BLOOD LEFT ARM  Final   Special Requests   Final    BOTTLES DRAWN AEROBIC AND ANAEROBIC Blood Culture results may not be optimal due to an inadequate volume of blood received in culture bottles   Culture   Final    NO GROWTH 2 DAYS Performed at [facility], [address], [place], [place] [address]    Report  Status PENDING  Incomplete    Studies/Results: No results found.     Assessment/Plan:  INTERVAL HISTORY: Patient continues to improve clinically   Principal Problem:   Cellulitis of lower extremity Active Problems:   Sepsis (HCC)   Thrombocytopenia (HCC)   MDS (myelodysplastic syndrome) (HCC)    Kathleen Wiley is a 79 y.o. female with history of recurrent cellulitis who I saw recently for admission for bilateral cellulitis after pedicure now readmitted with right lower extremity cellulitis she had the cefadroxil  on hand to initiate at the first signs of this but did not start it and it seems as if her cellulitis  progressed rapidly.  On admission she was placed on cefepime  and clindamycin  and developed a diffuse rash.  We have separately changed her to cefazolin   #1 Recurrent cellulitis:   She is continue to improve clinically.  She wants to stay in the hospital for just 1 more day but is otherwise eager to go home I will continue cefazolin  for now and then change over to oral cefadroxil  tomorrow to complete 2 weeks of antibiotics she should have a fresh prescription for this so that she has the other cefadroxil  I sent for her to have on hand available  #2  Drug rash likely due to cefepime  versus less likely clindamycin .  Fortunately she is tolerating cefazolin  without trouble  #3 MDS with TTPenia: I believe the thrombocytopenia likely contributes to some of the appearance of her right lower extremity with some petechial type areas observed   #4 Standard universal precautions  He already has an appointment to follow-up with me on 15 September.  I have personally spent 50 minutes involved in face-to-face and non-face-to-face activities for this patient on the day of the visit. Professional time spent includes the following activities: Preparing to see the patient (review of tests), Obtaining and/or reviewing separately obtained history (admission/discharge record), Performing a medically appropriate examination and/or evaluation , Ordering medications/tests/procedures, referring and communicating with other health care professionals, Documenting clinical information in the EMR, Independently interpreting results (not separately reported), Communicating results to the patient/family/caregiver, Counseling and educating the patient/family/caregiver and Care coordination (not separately reported).   Evaluation of the patient requires complex antimicrobial therapy evaluation, counseling , isolation needs to reduce disease transmission and risk assessment and mitigation.   I will sign off for now please call  with further questions.   LOS: 3 days   Kathleen Wiley 07/08/2024, 12:47 PM

## 2024-07-10 LAB — CULTURE, BLOOD (ROUTINE X 2)
Culture: NO GROWTH
Special Requests: ADEQUATE

## 2024-07-11 LAB — CULTURE, BLOOD (ROUTINE X 2): Culture: NO GROWTH

## 2024-07-13 ENCOUNTER — Telehealth: Payer: Self-pay

## 2024-07-13 DIAGNOSIS — N182 Chronic kidney disease, stage 2 (mild): Secondary | ICD-10-CM

## 2024-07-16 DIAGNOSIS — R5383 Other fatigue: Secondary | ICD-10-CM | POA: Diagnosis not present

## 2024-07-16 DIAGNOSIS — Z23 Encounter for immunization: Secondary | ICD-10-CM | POA: Diagnosis not present

## 2024-07-16 DIAGNOSIS — L03119 Cellulitis of unspecified part of limb: Secondary | ICD-10-CM | POA: Diagnosis not present

## 2024-07-16 DIAGNOSIS — Z8619 Personal history of other infectious and parasitic diseases: Secondary | ICD-10-CM | POA: Diagnosis not present

## 2024-07-16 DIAGNOSIS — Z96651 Presence of right artificial knee joint: Secondary | ICD-10-CM | POA: Diagnosis not present

## 2024-07-16 DIAGNOSIS — I10 Essential (primary) hypertension: Secondary | ICD-10-CM | POA: Diagnosis not present

## 2024-07-16 DIAGNOSIS — E1129 Type 2 diabetes mellitus with other diabetic kidney complication: Secondary | ICD-10-CM | POA: Diagnosis not present

## 2024-07-16 DIAGNOSIS — D469 Myelodysplastic syndrome, unspecified: Secondary | ICD-10-CM | POA: Diagnosis not present

## 2024-07-16 DIAGNOSIS — E538 Deficiency of other specified B group vitamins: Secondary | ICD-10-CM | POA: Diagnosis not present

## 2024-07-22 ENCOUNTER — Encounter: Payer: Self-pay | Admitting: Infectious Disease

## 2024-07-22 DIAGNOSIS — L270 Generalized skin eruption due to drugs and medicaments taken internally: Secondary | ICD-10-CM | POA: Insufficient documentation

## 2024-07-23 ENCOUNTER — Encounter: Payer: Self-pay | Admitting: Infectious Disease

## 2024-07-23 ENCOUNTER — Other Ambulatory Visit: Payer: Self-pay

## 2024-07-23 ENCOUNTER — Telehealth: Payer: Self-pay | Admitting: *Deleted

## 2024-07-23 ENCOUNTER — Ambulatory Visit: Payer: Self-pay | Admitting: Infectious Disease

## 2024-07-23 VITALS — BP 134/80 | HR 69 | Temp 98.1°F | Ht 64.0 in | Wt 220.0 lb

## 2024-07-23 DIAGNOSIS — L0390 Cellulitis, unspecified: Secondary | ICD-10-CM | POA: Diagnosis not present

## 2024-07-23 DIAGNOSIS — L03115 Cellulitis of right lower limb: Secondary | ICD-10-CM

## 2024-07-23 DIAGNOSIS — D696 Thrombocytopenia, unspecified: Secondary | ICD-10-CM

## 2024-07-23 DIAGNOSIS — D469 Myelodysplastic syndrome, unspecified: Secondary | ICD-10-CM

## 2024-07-23 DIAGNOSIS — L270 Generalized skin eruption due to drugs and medicaments taken internally: Secondary | ICD-10-CM | POA: Diagnosis not present

## 2024-07-23 MED ORDER — CEFADROXIL 500 MG PO CAPS: 500.0000 mg | ORAL_CAPSULE | Freq: Two times a day (BID) | ORAL | 5 refills | Status: DC

## 2024-07-23 NOTE — Progress Notes (Signed)
 Complex Care Management Note Care Guide Note  07/23/2024 Name: Kathleen Wiley MRN: [id number] DOB: 05-Oct-1946   Complex Care Management Outreach Attempts: An unsuccessful telephone outreach was attempted today to offer the patient information about available complex care management services.  Follow Up Plan:  Additional outreach attempts will be made to offer the patient complex care management information and services.   Encounter Outcome:  No Answer  Harlene Satterfield  [facility]  [facility] Guide  Direct Dial: [phone]  Fax [phone]

## 2024-07-23 NOTE — Progress Notes (Signed)
 Subjective:  Chief complaint: follow-up for recurrent cellulitis   Patient ID: Kathleen Wiley, female    DOB: 07/28/1946, 78 y.o.   MRN: [id number]  HPI  Discussed the use of AI scribe software for clinical note transcription with the patient, who gave verbal consent to proceed.  History of Present Illness   Kathleen Wiley is a 78 year old female with recurrent cellulitis who presents for follow-up after hospitalization for cellulitis.  She has a history of recurrent cellulitis, with the first recent episode occurring after a pedicure in early August, leading to hospitalization with R> Left bilateral cellulitis  Initially treated with IV antibiotics, narrowed to ancef  and cefadroxil  she had cefadroxil  on hand to start at the first signs of cellulitis but was unable to start it soon enough to prevent hospitalization. During her 2nd hospital stay, when she presented with R sided cellulitis she developed a drug rash while receiving clindamycin  and cefepime , and was subsequently switched to cefazolin  which she had no trouble with. She completed her treatment with cefadroxil .  She notes peeling and a sore on her foot, which she describes as her 'bad toe.' She has a history of cellulitis on her thumb and both legs, with three episodes on the right leg. She is concerned about the risk of infection from pedicures and is hesitant to return to them.  Her medical history includes myelodysplastic syndrome with thrombocytopenia. She is not currently on any medication for this condition. She also has a history of diabetes.  She experienced a significant rash which we have attributed to cefepime , resulting in a red, splotchy, and itchy rash, which was worse than a previous reaction to vancomycin . She is concerned about avoiding similar reactions in the future.  No recent hospitalization at [facility].       Past Medical History:  Diagnosis Date   Anxiety    Arthritis    knee, hip    Cellulitis 08/2016   left leg   Complication of anesthesia    Diabetes mellitus without complication (HCC)    Drug rash 07/22/2024   GERD (gastroesophageal reflux disease)    Heart murmur    slight   informed years ago.-    Hernia, umbilical    History of atrial fibrillation 2007   with flagyl   History of endometriosis    Hypercholesteremia    Hypertension    has not been to a cardiologist   PONV (postoperative nausea and vomiting)    Rectal vaginal fistula    Refusal of blood transfusions as patient is Jehovah's Witness    Sleep apnea     Past Surgical History:  Procedure Laterality Date   APPENDECTOMY     BUNIONECTOMY Right 01/09/2019   Procedure: RIGHT FOOT BUNION CORRECTION;  Surgeon: Elsa Lonni SAUNDERS, MD;  Location: [facility] OR;  Service: Orthopedics;  Laterality: Right;   CHOLECYSTECTOMY N/A 03/16/2021   Procedure: LAPAROSCOPIC CHOLECYSTECTOMY;  Surgeon: Aron Shoulders, MD;  Location: [facility] ORS;  Service: General;  Laterality: N/A;   ENDOSCOPIC RETROGRADE CHOLANGIOPANCREATOGRAPHY (ERCP) WITH PROPOFOL  N/A 03/15/2021   Procedure: ENDOSCOPIC RETROGRADE CHOLANGIOPANCREATOGRAPHY (ERCP) WITH PROPOFOL ;  Surgeon: Saintclair Jasper, MD;  Location: [facility];  Service: Gastroenterology;  Laterality: N/A;   EXPLORATORY LAPAROTOMY  1973   for endometrosis   HAMMERTOE RECONSTRUCTION WITH WEIL OSTEOTOMY Right 01/09/2019   Procedure: RIGHT FOOT 2-4 HAMMERTOE CORRECTION WITH WEIL OSTEOTOMY 2ND METATARSAL, DEEP TENDON TRANSFER WITH AKIN OSTEOTOMY;  Surgeon: Elsa Lonni SAUNDERS, MD;  Location: [facility] OR;  Service: Orthopedics;  Laterality: Right;   I & D EXTREMITY Right 06/10/2016   Procedure: IRRIGATION AND DEBRIDEMENT THUMB FLEXOR SHEATH;  Surgeon: Franky Curia, MD;  Location: [facility] OR;  Service: Orthopedics;  Laterality: Right;   I & D EXTREMITY Right 04/23/2022   Procedure: RIGHT LEG DEBRIDEMENT;  Surgeon: Harden Jerona GAILS, MD;  Location: [facility] OR;  Service: Orthopedics;  Laterality: Right;   JOINT REPLACEMENT      Left Hip   MANDIBLE SURGERY  1978   rectovaginal fistula repair  1980   REMOVAL OF STONES  03/15/2021   Procedure: REMOVAL OF STONES;  Surgeon: Saintclair Jasper, MD;  Location: [facility];  Service: Gastroenterology;;   ANNETT  03/15/2021   Procedure: ANNETT;  Surgeon: Saintclair Jasper, MD;  Location: [facility];  Service: Gastroenterology;;   TOTAL HIP ARTHROPLASTY  05/08/2012   Procedure: TOTAL HIP ARTHROPLASTY;  Surgeon: Dempsey JINNY Sensor, MD;  Location: [facility] OR;  Service: Orthopedics;  Laterality: Left;   TOTAL HIP ARTHROPLASTY Right 09/18/2018   Procedure: TOTAL HIP ARTHROPLASTY ANTERIOR APPROACH;  Surgeon: Sensor Dempsey, MD;  Location: [facility];  Service: Orthopedics;  Laterality: Right;   TOTAL KNEE ARTHROPLASTY Right 01/24/2013   Dr Sensor   TOTAL KNEE ARTHROPLASTY Right 01/24/2013   Procedure: RIGHT TOTAL KNEE ARTHROPLASTY;  Surgeon: Dempsey JINNY Sensor, MD;  Location: [facility] OR;  Service: Orthopedics;  Laterality: Right;    Family History  Problem Relation Age of Onset   Dementia Mother    Cancer Father    Breast cancer Neg Hx       Social History   Socioeconomic History   Marital status: Married    Spouse name: Not on file   Number of children: 1   Years of education: Not on file   Highest education level: Not on file  Occupational History   Not on file  Tobacco Use   Smoking status: Former    Current packs/day: 0.00    Average packs/day: 1 pack/day for 10.0 years (10.0 ttl pk-yrs)    Types: Cigarettes    Start date: 11/09/1971    Quit date: 11/08/1981    Years since quitting: 42.7   Smokeless tobacco: Never  Vaping Use   Vaping status: Never Used  Substance and Sexual Activity   Alcohol use: No   Drug use: No   Sexual activity: Not on file  Other Topics Concern   Not on file  Social History Narrative   Not on file   Social Drivers of Health   Financial Resource Strain: Not on file  Food Insecurity: No Food Insecurity (07/06/2024)   Hunger Vital Sign    Worried About  Running Out of Food in the Last Year: Never true    Ran Out of Food in the Last Year: Never true  Transportation Needs: No Transportation Needs (07/06/2024)   PRAPARE - Administrator, Civil Service (Medical): No    Lack of Transportation (Non-Medical): No  Physical Activity: Not on file  Stress: Not on file  Social Connections: Moderately Integrated (07/06/2024)   Social Connection and Isolation Panel    Frequency of Communication with Friends and Family: Three times a week    Frequency of Social Gatherings with Friends and Family: Three times a week    Attends Religious Services: More than 4 times per year    Active Member of Clubs or Organizations: No    Attends Banker Meetings: Never    Marital Status: Married  Allergies  Allergen Reactions   Other     Refuses whole blood but does accept albumin    Ditropan  [Oxybutynin ] Other (See Comments)    Unknown reaction   Flagyl [Metronidazole] Other (See Comments)    Chest pain Induced A-fib    Lipitor [Atorvastatin] Other (See Comments)    Unknown reaction   Cefepime  Rash    Has tolerated Cefazolin  + cefadroxil    Septra [Sulfamethoxazole-Trimethoprim] Rash    Blistering rash    Vancomycin  Itching     Current Outpatient Medications:    Cholecalciferol  (VITAMIN D-3 PO), Take 1 capsule by mouth daily., Disp: , Rfl:    Cyanocobalamin  (VITAMIN B-12 PO), Take 1 tablet by mouth daily., Disp: , Rfl:    escitalopram  (LEXAPRO ) 20 MG tablet, Take 20 mg by mouth at bedtime., Disp: , Rfl:    furosemide  (LASIX ) 20 MG tablet, Take 20 mg by mouth daily., Disp: , Rfl:    gabapentin  (NEURONTIN ) 300 MG capsule, Take 300 mg by mouth 2 (two) times daily., Disp: , Rfl:    metoprolol  succinate (TOPROL  XL) 25 MG 24 hr tablet, Take 0.5 tablets (12.5 mg total) by mouth daily., Disp: , Rfl:    omeprazole (PRILOSEC) 20 MG capsule, Take 20 mg by mouth daily., Disp: , Rfl:    potassium chloride  (KLOR-CON ) 10 MEQ tablet, Take 10  mEq by mouth daily., Disp: , Rfl:    rosuvastatin  (CRESTOR ) 5 MG tablet, Take 5 mg by mouth at bedtime., Disp: , Rfl:    cefadroxil  (DURICEF) 500 MG capsule, Take 1 capsule (500 mg total) by mouth 2 (two) times daily. To be taken at very first sign of cellulitis, Disp: 28 capsule, Rfl: 5   Review of Systems  Constitutional:  Negative for activity change, appetite change, chills, diaphoresis, fatigue, fever and unexpected weight change.  HENT:  Negative for congestion, rhinorrhea, sinus pressure, sneezing, sore throat and trouble swallowing.   Eyes:  Negative for photophobia and visual disturbance.  Respiratory:  Negative for cough, chest tightness, shortness of breath, wheezing and stridor.   Cardiovascular:  Negative for chest pain, palpitations and leg swelling.  Gastrointestinal:  Negative for abdominal distention, abdominal pain, anal bleeding, blood in stool, constipation, diarrhea, nausea and vomiting.  Genitourinary:  Negative for difficulty urinating, dysuria, flank pain and hematuria.  Musculoskeletal:  Negative for arthralgias, back pain, gait problem, joint swelling and myalgias.  Skin:  Negative for color change, pallor, rash and wound.  Neurological:  Negative for dizziness, tremors, weakness and light-headedness.  Hematological:  Negative for adenopathy. Does not bruise/bleed easily.  Psychiatric/Behavioral:  Negative for agitation, behavioral problems, confusion, decreased concentration, dysphoric mood and sleep disturbance.        Objective:   Physical Exam Constitutional:      General: She is not in acute distress.    Appearance: Normal appearance. She is well-developed. She is not ill-appearing or diaphoretic.  HENT:     Head: Normocephalic and atraumatic.     Right Ear: Hearing and external ear normal.     Left Ear: Hearing and external ear normal.     Nose: No nasal deformity or rhinorrhea.  Eyes:     General: No scleral icterus.    Conjunctiva/sclera:  Conjunctivae normal.     Right eye: Right conjunctiva is not injected.     Left eye: Left conjunctiva is not injected.     Pupils: Pupils are equal, round, and reactive to light.  Neck:     Vascular: No JVD.  Cardiovascular:  Rate and Rhythm: Normal rate and regular rhythm.     Heart sounds: S1 normal and S2 normal.  Pulmonary:     Effort: Pulmonary effort is normal. No respiratory distress.     Breath sounds: No wheezing.  Abdominal:     General: There is no distension.     Palpations: Abdomen is soft.  Musculoskeletal:        General: Normal range of motion.     Right shoulder: Normal.     Left shoulder: Normal.     Cervical back: Normal range of motion and neck supple.     Right hip: Normal.     Left hip: Normal.     Right knee: Normal.     Left knee: Normal.  Lymphadenopathy:     Head:     Right side of head: No submandibular, preauricular or posterior auricular adenopathy.     Left side of head: No submandibular, preauricular or posterior auricular adenopathy.     Cervical: No cervical adenopathy.     Right cervical: No superficial or deep cervical adenopathy.    Left cervical: No superficial or deep cervical adenopathy.  Skin:    General: Skin is warm and dry.     Coloration: Skin is not pale.     Findings: No abrasion, bruising, ecchymosis, erythema, lesion or rash.     Nails: There is no clubbing.  Neurological:     General: No focal deficit present.     Mental Status: She is alert and oriented to person, place, and time.     Sensory: No sensory deficit.     Coordination: Coordination normal.     Gait: Gait normal.  Psychiatric:        Attention and Perception: She is attentive.        Mood and Affect: Mood normal.        Speech: Speech normal.        Behavior: Behavior normal. Behavior is cooperative.        Thought Content: Thought content normal.        Judgment: Judgment normal.     Lower legs and feet 07/23/2024:                   Assessment & Plan:   Assessment and Plan    Recurrent cellulitis of bilateral lower extremities Recurrent cellulitis linked to pedicures, increased risk due to myelodysplastic syndrome with thrombocytopenia. - Prescribe cefadroxil  for immediate use at first sign of cellulitis. - Advise to avoid pedicures due to infection risk, particularly from non-tuberculous mycobacteria. - Recommend follow-up in six months to monitor for recurrence and manage antibiotic prescriptions.  Myelodysplastic syndrome with thrombocytopenia Increased infection risk due to compromised immune function. her Arnie also can make her cellulitis look a bit petechial at least it seemed to this last time. - Monitor for signs of infection and manage cellulitis promptly.  Drug rash due to antibiotics (clindamycin  and cefepime ) and allergy to cefepime  Developed rash with clindamycin  and cefepime ; cefepime  allergy documented. - Document cefepime  allergy in medical chart. - Advise to avoid clindamycin  due to potential adverse effects and limited utility.  DM to followup with PCP

## 2024-07-23 NOTE — Progress Notes (Signed)
 Complex Care Management Note  Care Guide Note 07/23/2024 Name: HUDA PETREY MRN: [id number] DOB: [birth year]  KIYOKO MCGUIRT is a 78 y.o. year old female who sees Dyane Anthony RAMAN, FNP for primary care. I reached out to Neville JINNY Gosling by phone today to offer complex care management services.  Ms. Oldaker was given information about Complex Care Management services today including:   The Complex Care Management services include support from the care team which includes your Nurse Care Manager, Clinical Social Worker, or Pharmacist.  The Complex Care Management team is here to help remove barriers to the health concerns and goals most important to you. Complex Care Management services are voluntary, and the patient may decline or stop services at any time by request to their care team member.   Complex Care Management Consent Status: Patient did not agree to participate in complex care management services at this time.  Follow up plan:  None  Encounter Outcome:  Patient Refused  Harlene Satterfield  [facility]  [facility], [facility] Med Guide  Direct Dial: [phone]  Fax [phone]

## 2024-08-07 DIAGNOSIS — E1140 Type 2 diabetes mellitus with diabetic neuropathy, unspecified: Secondary | ICD-10-CM | POA: Diagnosis not present

## 2024-08-07 DIAGNOSIS — I5031 Acute diastolic (congestive) heart failure: Secondary | ICD-10-CM | POA: Diagnosis not present

## 2024-09-21 ENCOUNTER — Emergency Department (HOSPITAL_COMMUNITY)

## 2024-09-21 ENCOUNTER — Encounter (HOSPITAL_COMMUNITY): Payer: Self-pay | Admitting: Internal Medicine

## 2024-09-21 ENCOUNTER — Inpatient Hospital Stay (HOSPITAL_COMMUNITY)
Admission: EM | Admit: 2024-09-21 | Discharge: 2024-09-26 | DRG: 872 | Disposition: A | Discharge status: Home / Self Care

## 2024-09-21 DIAGNOSIS — E872 Acidosis, unspecified: Secondary | ICD-10-CM | POA: Diagnosis present

## 2024-09-21 DIAGNOSIS — N182 Chronic kidney disease, stage 2 (mild): Secondary | ICD-10-CM | POA: Diagnosis present

## 2024-09-21 DIAGNOSIS — Z888 Allergy status to other drugs, medicaments and biological substances status: Secondary | ICD-10-CM

## 2024-09-21 DIAGNOSIS — Z87891 Personal history of nicotine dependence: Secondary | ICD-10-CM

## 2024-09-21 DIAGNOSIS — A419 Sepsis, unspecified organism: Secondary | ICD-10-CM | POA: Diagnosis present

## 2024-09-21 DIAGNOSIS — Z96641 Presence of right artificial hip joint: Secondary | ICD-10-CM | POA: Diagnosis present

## 2024-09-21 DIAGNOSIS — L03115 Cellulitis of right lower limb: Principal | ICD-10-CM | POA: Diagnosis present

## 2024-09-21 DIAGNOSIS — M7989 Other specified soft tissue disorders: Secondary | ICD-10-CM | POA: Diagnosis not present

## 2024-09-21 DIAGNOSIS — E119 Type 2 diabetes mellitus without complications: Secondary | ICD-10-CM | POA: Diagnosis not present

## 2024-09-21 DIAGNOSIS — R52 Pain, unspecified: Secondary | ICD-10-CM | POA: Diagnosis not present

## 2024-09-21 DIAGNOSIS — Z1152 Encounter for screening for COVID-19: Secondary | ICD-10-CM

## 2024-09-21 DIAGNOSIS — E782 Mixed hyperlipidemia: Secondary | ICD-10-CM | POA: Diagnosis not present

## 2024-09-21 DIAGNOSIS — I129 Hypertensive chronic kidney disease with stage 1 through stage 4 chronic kidney disease, or unspecified chronic kidney disease: Secondary | ICD-10-CM | POA: Diagnosis present

## 2024-09-21 DIAGNOSIS — E7800 Pure hypercholesterolemia, unspecified: Secondary | ICD-10-CM | POA: Diagnosis present

## 2024-09-21 DIAGNOSIS — E785 Hyperlipidemia, unspecified: Secondary | ICD-10-CM | POA: Diagnosis present

## 2024-09-21 DIAGNOSIS — K219 Gastro-esophageal reflux disease without esophagitis: Secondary | ICD-10-CM | POA: Diagnosis present

## 2024-09-21 DIAGNOSIS — E875 Hyperkalemia: Secondary | ICD-10-CM | POA: Diagnosis present

## 2024-09-21 DIAGNOSIS — R55 Syncope and collapse: Secondary | ICD-10-CM | POA: Diagnosis present

## 2024-09-21 DIAGNOSIS — I480 Paroxysmal atrial fibrillation: Secondary | ICD-10-CM | POA: Diagnosis present

## 2024-09-21 DIAGNOSIS — N179 Acute kidney failure, unspecified: Secondary | ICD-10-CM | POA: Diagnosis present

## 2024-09-21 DIAGNOSIS — Z79899 Other long term (current) drug therapy: Secondary | ICD-10-CM | POA: Diagnosis not present

## 2024-09-21 DIAGNOSIS — D696 Thrombocytopenia, unspecified: Secondary | ICD-10-CM | POA: Diagnosis present

## 2024-09-21 DIAGNOSIS — Z96651 Presence of right artificial knee joint: Secondary | ICD-10-CM | POA: Diagnosis present

## 2024-09-21 DIAGNOSIS — E860 Dehydration: Secondary | ICD-10-CM | POA: Diagnosis present

## 2024-09-21 DIAGNOSIS — F419 Anxiety disorder, unspecified: Secondary | ICD-10-CM | POA: Diagnosis present

## 2024-09-21 DIAGNOSIS — D631 Anemia in chronic kidney disease: Secondary | ICD-10-CM | POA: Diagnosis present

## 2024-09-21 DIAGNOSIS — E1122 Type 2 diabetes mellitus with diabetic chronic kidney disease: Secondary | ICD-10-CM | POA: Diagnosis present

## 2024-09-21 DIAGNOSIS — I951 Orthostatic hypotension: Secondary | ICD-10-CM | POA: Diagnosis present

## 2024-09-21 DIAGNOSIS — Z7901 Long term (current) use of anticoagulants: Secondary | ICD-10-CM | POA: Diagnosis not present

## 2024-09-21 DIAGNOSIS — R161 Splenomegaly, not elsewhere classified: Secondary | ICD-10-CM | POA: Diagnosis present

## 2024-09-21 DIAGNOSIS — K7460 Unspecified cirrhosis of liver: Secondary | ICD-10-CM | POA: Diagnosis present

## 2024-09-21 DIAGNOSIS — G4733 Obstructive sleep apnea (adult) (pediatric): Secondary | ICD-10-CM | POA: Diagnosis present

## 2024-09-21 DIAGNOSIS — L538 Other specified erythematous conditions: Secondary | ICD-10-CM | POA: Diagnosis not present

## 2024-09-21 DIAGNOSIS — R531 Weakness: Secondary | ICD-10-CM

## 2024-09-21 DIAGNOSIS — E1142 Type 2 diabetes mellitus with diabetic polyneuropathy: Secondary | ICD-10-CM | POA: Diagnosis present

## 2024-09-21 DIAGNOSIS — Z7409 Other reduced mobility: Secondary | ICD-10-CM | POA: Diagnosis present

## 2024-09-21 LAB — URINALYSIS, ROUTINE W REFLEX MICROSCOPIC
Bacteria, UA: NONE SEEN
Bilirubin Urine: NEGATIVE
Glucose, UA: NEGATIVE mg/dL
Ketones, ur: NEGATIVE mg/dL
Leukocytes,Ua: NEGATIVE
Nitrite: NEGATIVE
Protein, ur: NEGATIVE mg/dL
Specific Gravity, Urine: 1.012 (ref 1.005–1.030)
pH: 6 (ref 5.0–8.0)

## 2024-09-21 LAB — COMPREHENSIVE METABOLIC PANEL WITH GFR
ALT: 38 U/L (ref 0–44)
AST: 60 U/L — ABNORMAL HIGH (ref 15–41)
Albumin: 3.4 g/dL — ABNORMAL LOW (ref 3.5–5.0)
Alkaline Phosphatase: 65 U/L (ref 38–126)
Anion gap: 15 (ref 5–15)
BUN: 19 mg/dL (ref 8–23)
CO2: 19 mmol/L — ABNORMAL LOW (ref 22–32)
Calcium: 8.7 mg/dL — ABNORMAL LOW (ref 8.9–10.3)
Chloride: 102 mmol/L (ref 98–111)
Creatinine, Ser: 1.2 mg/dL — ABNORMAL HIGH (ref 0.44–1.00)
GFR, Estimated: 47 mL/min — ABNORMAL LOW (ref >60)
Glucose, Bld: 177 mg/dL — ABNORMAL HIGH (ref 70–99)
Potassium: 5.2 mmol/L — ABNORMAL HIGH (ref 3.5–5.1)
Sodium: 136 mmol/L (ref 135–145)
Total Bilirubin: 1.9 mg/dL — ABNORMAL HIGH (ref 0.0–1.2)
Total Protein: 6.5 g/dL (ref 6.5–8.1)

## 2024-09-21 LAB — CBC
HCT: 37.6 % (ref 36.0–46.0)
Hemoglobin: 12.4 g/dL (ref 12.0–15.0)
MCH: 32.8 pg (ref 26.0–34.0)
MCHC: 33 g/dL (ref 30.0–36.0)
MCV: 99.5 fL (ref 80.0–100.0)
Platelets: 98 K/uL — ABNORMAL LOW (ref 150–400)
RBC: 3.78 MIL/uL — ABNORMAL LOW (ref 3.87–5.11)
RDW: 14.1 % (ref 11.5–15.5)
WBC: 4.6 K/uL (ref 4.0–10.5)
nRBC: 0.4 % — ABNORMAL HIGH (ref 0.0–0.2)

## 2024-09-21 LAB — RESP PANEL BY RT-PCR (RSV, FLU A&B, COVID)  RVPGX2
Influenza A by PCR: NEGATIVE
Influenza B by PCR: NEGATIVE
Resp Syncytial Virus by PCR: NEGATIVE
SARS Coronavirus 2 by RT PCR: NEGATIVE

## 2024-09-21 LAB — TROPONIN I (HIGH SENSITIVITY)
Troponin I (High Sensitivity): 11 ng/L (ref <18)
Troponin I (High Sensitivity): 17 ng/L (ref <18)

## 2024-09-21 LAB — CBG MONITORING, ED: Glucose-Capillary: 180 mg/dL — ABNORMAL HIGH (ref 70–99)

## 2024-09-21 LAB — I-STAT CG4 LACTIC ACID, ED: Lactic Acid, Venous: 3.4 mmol/L (ref 0.5–1.9)

## 2024-09-21 MED ORDER — SODIUM CHLORIDE 0.9 % IV SOLN
1.0000 g | Freq: Once | INTRAVENOUS | Status: AC
  Administered 2024-09-21: 1 g via INTRAVENOUS
  Filled 2024-09-21: qty 10

## 2024-09-21 MED ORDER — LACTATED RINGERS IV SOLN: INTRAVENOUS | Status: AC

## 2024-09-21 MED ORDER — ONDANSETRON HCL 4 MG/2ML IJ SOLN
4.0000 mg | Freq: Four times a day (QID) | INTRAMUSCULAR | Status: DC | PRN
  Administered 2024-09-22 (×2): 4 mg via INTRAVENOUS
  Filled 2024-09-21 (×2): qty 2

## 2024-09-21 MED ORDER — SODIUM CHLORIDE 0.9 % IV BOLUS
1000.0000 mL | Freq: Once | INTRAVENOUS | Status: AC
  Administered 2024-09-21: 1000 mL via INTRAVENOUS

## 2024-09-21 MED ORDER — ONDANSETRON HCL 4 MG/2ML IJ SOLN
4.0000 mg | Freq: Once | INTRAMUSCULAR | Status: AC
  Administered 2024-09-21: 4 mg via INTRAVENOUS
  Filled 2024-09-21: qty 2

## 2024-09-21 MED ORDER — SODIUM CHLORIDE 0.9 % IV BOLUS
500.0000 mL | Freq: Once | INTRAVENOUS | Status: AC
  Administered 2024-09-21: 500 mL via INTRAVENOUS

## 2024-09-21 MED ORDER — ACETAMINOPHEN 325 MG PO TABS
650.0000 mg | ORAL_TABLET | Freq: Four times a day (QID) | ORAL | Status: DC | PRN
  Administered 2024-09-22 – 2024-09-24 (×3): 650 mg via ORAL
  Filled 2024-09-21 (×3): qty 2

## 2024-09-21 MED ORDER — SODIUM CHLORIDE 0.9 % IV SOLN
1.0000 g | INTRAVENOUS | Status: DC
  Administered 2024-09-22: 1 g via INTRAVENOUS
  Filled 2024-09-21: qty 10

## 2024-09-21 MED ORDER — IOHEXOL 350 MG/ML SOLN
75.0000 mL | Freq: Once | INTRAVENOUS | Status: AC | PRN
  Administered 2024-09-21: 75 mL via INTRAVENOUS

## 2024-09-21 MED ORDER — ACETAMINOPHEN 650 MG RE SUPP
650.0000 mg | Freq: Four times a day (QID) | RECTAL | Status: DC | PRN
  Administered 2024-09-22: 650 mg via RECTAL
  Filled 2024-09-21: qty 1

## 2024-09-21 MED ORDER — MELATONIN 3 MG PO TABS
3.0000 mg | ORAL_TABLET | Freq: Every evening | ORAL | Status: DC | PRN
  Administered 2024-09-24 (×2): 3 mg via ORAL
  Filled 2024-09-21 (×2): qty 1

## 2024-09-21 NOTE — ED Notes (Signed)
 This nurse called CT regarding the CT PE scan. This nurse asked if the IV the patient currently has can be used to run the study. CT stated that the IV needs to be in a higher area on the arm.

## 2024-09-21 NOTE — ED Notes (Signed)
 3 attempts to collect labs. Not able to collect second set of cultures. RN aware

## 2024-09-21 NOTE — ED Notes (Signed)
 Patient returned from CT

## 2024-09-21 NOTE — H&P (Signed)
 History and Physical      Kathleen Wiley FMW:[id number] DOB: 10/28/1946 DOA: 09/21/2024; DOS: 09/21/2024  PCP: Dyane Anthony RAMAN, FNP  Patient coming from: home   I have personally briefly reviewed patient's old medical records in [facility]  Chief Complaint: Generalized weakness  HPI: Kathleen Wiley is a 78 y.o. female with medical history significant for type 2 diabetes mellitus complicated by diabetic peripheral polyneuropathy, paroxysmal atrial fibrillation on chronic anticoagulation, essential pretension, hyperlipidemia, Jehovah's Witness, chronic thrombocytopenia with baseline platelet range 65-1 20, who is admitted to [facility] on 09/21/2024 with cellulitis of the right lower extremity after presenting from home to [facility] ED complaining of generalized weakness.   The patient reports 2 to 3 days of generalized weakness in the absence of any acute focal weakness.  She reports that this has been associated with fatigue and some lethargy.  But over the last 2 to 3 days, she has also noted progressive erythema involving the distal right lower extremity associate with some increased tenderness, increased warmth, along with interval increase in swelling relative to her reported chronic venous stasis.  Denies any known recent preceding trauma to the right lower extremity.  She has a history of chronic peripheral polyneuropathy as a consequence of her underlying diabetes, but relative to this baseline denies any recent change in sensation involving the right lower extremity.  She reports associated subjective fever in the absence of chills, full body rigors, or generalized myalgias.  Denies any recent dysuria or gross hematuria.  Earlier today, after urinating, the patient attempted to stand up from the toilet, at which time she developed dizziness, lightheadedness, subjective sensation of impending loss of consciousness, before formally losing consciousness and falling forward, the  patient believing that she hit her head on the bathroom floor as a component of the sequence.  Denies any associated tongue biting or any loss of bowel/bladder control.  Denies any associated chest pain.  On review of systems, she does acknowledge some mild shortness of breath over the last few days, but in the absence of any cough, hemoptysis.  Denies any recent wheezing.  No recent orthopnea, PND.  She has a history of paroxysmal atrial fibrillation, but is not on any formal anticoagulation.  Her outpatient AV nodal blocking regimen is noted to consist of metoprolol  succinate.   Her most recent echocardiogram occurred in June 2023 was notable for LVEF 60 to 65%, no evidence of focal motion abnormalities, severe concentric LVH, indeterminate diastolic parameters, normal right ventricular systolic function, and moderately dilated left atrium.    ED Course:  Vital signs in the ED were notable for the following: Afebrile; temperature max 99.7; heart rates in the 80s to low 100s; systolic blood pressures in the low 100s; respiratory rate 15-26, oxygen saturation 95 to 100% on 3 L nasal cannula for patient comfort in the absence of any objective hypoxia.  Labs were notable for the following: CMP, which was associate with a hemolyzed specimen, but with this caveat, demonstrated the following: Sodium 136, potassium 5.2, creatinine 1.20 compared to 0.88 on 07/08/2024, glucose 177, calcium  adjusted for mild hyperlipidemia noted to be 9.1, avidin 3.4.  Initial high sensitive troponin I was 11, with repeat trending up slightly to 17.  Initial lactic acid was 3.4, relative demonstrates a prior lactic acid of 4.5 on 07/05/2024.  Repeat lactic acid levels been ordered, with result currently pending.  CBC notable for white blood cell count 4600, hemoglobin 12.4, platelet count 98 compared  to most recent prior value of 94 on 07/08/2024.  Urinalysis showed no white blood cells, leukocyte esterase/nitrate negative and showed  no bacteria, while demonstrating small hemoglobin in the absence of any RBCs and was also noted to be negative for protein.  Blood cultures x 2 were collected prior to initiation of IV antibiotics, and COVID, influenza, RSV PCR were all negative.  Per my interpretation, EKG in ED demonstrated the following: In comparison to most recent prior EKG performed on 07/05/2024, presenting EKG shows sinus tachycardia with heart rate 104, normal intervals, nonspecific T wave inversion in lead III, which appears unchanged fromprior EKG, showing no evidence of interval T wave changes, and no evidence of ST changes, including no evidence of ST elevation.  Imaging in the ED, per corresponding formal radiology read, was notable for the following: 1 view chest x-ray showed no evidence of acute cardiopulmonary process, including no evidence of infiltrate, edema, effusion, or pneumothorax.  In the context of the patient hitting her head as a competitive the sequence involving her single syncopal episode, CT head showed no evidence of acute intracranial process, Cleen evidence of intracranial Atriance of acute infarct.  CTA chest with PE protocol showed no evidence of acute pulmonary embolism nor any evidence of additional acute cardiopulmonary process, including no evidence of infiltrate, edema, effusion, or pneumothorax, while showing findings that were suggestive of pulmonary artery hypertension.  CT Abdo/pelvis with contrast, showed no evidence of acute intra-abdominal or acute intrapelvic process, including no evidence of postrenal obstruction, including no evidence of ureteral stone.  This imaging showed colonic diverticulosis in the absence of any evidence of diverticulitis.  Venous ultrasound of the right lower extremities been ordered, Therazole currently pending.  While in the ED, the following were administered: Zofran  4 mg IV x 1 dose, Rocephin , normal saline x 1.5 L bolus.  Subsequently, the patient was admitted  for further evaluation management presenting cellulitis of the right lower extremity, with presentation also notable for generalized weakness, single syncopal episode, mildly elevated lactic acid, as well as acute kidney injury.     Review of Systems: As per HPI otherwise 10 point review of systems negative.   Past Medical History:  Diagnosis Date   Anxiety    Arthritis    knee, hip   Cellulitis 08/2016   left leg   Complication of anesthesia    Diabetes mellitus without complication (HCC)    Drug rash 07/22/2024   GERD (gastroesophageal reflux disease)    Heart murmur    slight   informed years ago.-    Hernia, umbilical    History of atrial fibrillation 2007   with flagyl   History of endometriosis    Hypercholesteremia    Hypertension    has not been to a cardiologist   PONV (postoperative nausea and vomiting)    Rectal vaginal fistula    Refusal of blood transfusions as patient is Jehovah's Witness    Sleep apnea     Past Surgical History:  Procedure Laterality Date   APPENDECTOMY     BUNIONECTOMY Right 01/09/2019   Procedure: RIGHT FOOT BUNION CORRECTION;  Surgeon: Elsa Lonni SAUNDERS, MD;  Location: [facility] OR;  Service: Orthopedics;  Laterality: Right;   CHOLECYSTECTOMY N/A 03/16/2021   Procedure: LAPAROSCOPIC CHOLECYSTECTOMY;  Surgeon: Aron Shoulders, MD;  Location: [facility] ORS;  Service: General;  Laterality: N/A;   ENDOSCOPIC RETROGRADE CHOLANGIOPANCREATOGRAPHY (ERCP) WITH PROPOFOL  N/A 03/15/2021   Procedure: ENDOSCOPIC RETROGRADE CHOLANGIOPANCREATOGRAPHY (ERCP) WITH PROPOFOL ;  Surgeon: Saintclair Jasper, MD;  Location: [facility];  Service: Gastroenterology;  Laterality: N/A;   EXPLORATORY LAPAROTOMY  1973   for endometrosis   HAMMERTOE RECONSTRUCTION WITH WEIL OSTEOTOMY Right 01/09/2019   Procedure: RIGHT FOOT 2-4 HAMMERTOE CORRECTION WITH WEIL OSTEOTOMY 2ND METATARSAL, DEEP TENDON TRANSFER WITH AKIN OSTEOTOMY;  Surgeon: Elsa Lonni SAUNDERS, MD;  Location: [facility] OR;  Service:  Orthopedics;  Laterality: Right;   I & D EXTREMITY Right 06/10/2016   Procedure: IRRIGATION AND DEBRIDEMENT THUMB FLEXOR SHEATH;  Surgeon: Franky Curia, MD;  Location: [facility] OR;  Service: Orthopedics;  Laterality: Right;   I & D EXTREMITY Right 04/23/2022   Procedure: RIGHT LEG DEBRIDEMENT;  Surgeon: Harden Jerona GAILS, MD;  Location: [facility] OR;  Service: Orthopedics;  Laterality: Right;   JOINT REPLACEMENT     Left Hip   MANDIBLE SURGERY  1978   rectovaginal fistula repair  1980   REMOVAL OF STONES  03/15/2021   Procedure: REMOVAL OF STONES;  Surgeon: Saintclair Jasper, MD;  Location: [facility];  Service: Gastroenterology;;   ANNETT  03/15/2021   Procedure: ANNETT;  Surgeon: Saintclair Jasper, MD;  Location: [facility];  Service: Gastroenterology;;   TOTAL HIP ARTHROPLASTY  05/08/2012   Procedure: TOTAL HIP ARTHROPLASTY;  Surgeon: Dempsey JINNY Sensor, MD;  Location: [facility] OR;  Service: Orthopedics;  Laterality: Left;   TOTAL HIP ARTHROPLASTY Right 09/18/2018   Procedure: TOTAL HIP ARTHROPLASTY ANTERIOR APPROACH;  Surgeon: Sensor Dempsey, MD;  Location: [facility]S;  Service: Orthopedics;  Laterality: Right;   TOTAL KNEE ARTHROPLASTY Right 01/24/2013   Dr Sensor   TOTAL KNEE ARTHROPLASTY Right 01/24/2013   Procedure: RIGHT TOTAL KNEE ARTHROPLASTY;  Surgeon: Dempsey JINNY Sensor, MD;  Location: [facility] OR;  Service: Orthopedics;  Laterality: Right;    Social History:  reports that she quit smoking about 42 years ago. Her smoking use included cigarettes. She started smoking about 52 years ago. She has a 10 pack-year smoking history. She has never used smokeless tobacco. She reports that she does not drink alcohol and does not use drugs.   Allergies  Allergen Reactions   Other     Refuses whole blood but does accept albumin    Ditropan  [Oxybutynin ] Other (See Comments)    Unknown reaction   Flagyl [Metronidazole] Other (See Comments)    Chest pain Induced A-fib    Lipitor [Atorvastatin] Other (See Comments)    Unknown  reaction   Cefepime  Rash    Has tolerated Cefazolin  + cefadroxil    Septra [Sulfamethoxazole-Trimethoprim] Rash    Blistering rash    Vancomycin  Itching    Family History  Problem Relation Age of Onset   Dementia Mother    Cancer Father    Breast cancer Neg Hx     Family history reviewed and not pertinent    Prior to Admission medications   Medication Sig Start Date End Date Taking? Authorizing Provider  cefadroxil  (DURICEF) 500 MG capsule Take 1 capsule (500 mg total) by mouth 2 (two) times daily. To be taken at very first sign of cellulitis 07/23/24   Fleeta Rothman, Jomarie SAILOR, MD  Cholecalciferol  (VITAMIN D-3 PO) Take 1 capsule by mouth daily.    [provider]  Cyanocobalamin  (VITAMIN B-12 PO) Take 1 tablet by mouth daily.    [provider]  escitalopram  (LEXAPRO ) 20 MG tablet Take 20 mg by mouth at bedtime.    [provider]  furosemide  (LASIX ) 20 MG tablet Take 20 mg by mouth daily. 09/23/19   [provider]  gabapentin  (NEURONTIN ) 300  MG capsule Take 300 mg by mouth 2 (two) times daily. 07/25/19   [provider]  metoprolol  succinate (TOPROL  XL) 25 MG 24 hr tablet Take 0.5 tablets (12.5 mg total) by mouth daily. 05/06/22   Vann, Jessica U, DO  omeprazole (PRILOSEC) 20 MG capsule Take 20 mg by mouth daily.    [provider]  potassium chloride  (KLOR-CON ) 10 MEQ tablet Take 10 mEq by mouth daily.    [provider]  rosuvastatin  (CRESTOR ) 5 MG tablet Take 5 mg by mouth at bedtime.    [provider]     Objective    Physical Exam: Vitals:   09/21/24 1820 09/21/24 2117  BP: (!) 128/58 (!) 107/47  Pulse: (!) 105 86  Resp: 18 15  Temp: 99.7 F (37.6 C)   SpO2: 98% 100%    General: appears to be stated age; alert Skin: warm, dry, erythematous with the distal aspect of the right lower extremity, associated with some increased warmth, tenderness, mild swelling, in the absence of any associated  crepitus Head:  AT/[place] Mouth:  Oral mucosa membranes appear dry, normal dentition Neck: supple; trachea midline Heart:  RRR; did not appreciate any M/R/G Lungs: CTAB, did not appreciate any wheezes, rales, or rhonchi Abdomen: + BS; soft, ND, NT Vascular: 2+ pedal pulses b/l; 2+ radial pulses b/l Extremities:  no muscle wasting  ; right lower extremity erythema, swelling, tenderness, increased warmth, as further outlined above     Labs on Admission: I have personally reviewed following labs and imaging studies  CBC: Recent Labs  Lab 09/21/24 1856  WBC 4.6  HGB 12.4  HCT 37.6  MCV 99.5  PLT 98*   Basic Metabolic Panel: Recent Labs  Lab 09/21/24 1856  NA 136  K 5.2*  CL 102  CO2 19*  GLUCOSE 177*  BUN 19  CREATININE 1.20*  CALCIUM  8.7*   GFR: CrCl cannot be calculated (Unknown ideal weight.). Liver Function Tests: Recent Labs  Lab 09/21/24 1856  AST 60*  ALT 38  ALKPHOS 65  BILITOT 1.9*  PROT 6.5  ALBUMIN  3.4*   No results for input(s): LIPASE, AMYLASE in the last 168 hours. No results for input(s): AMMONIA in the last 168 hours. Coagulation Profile: No results for input(s): INR, PROTIME in the last 168 hours. Cardiac Enzymes: No results for input(s): CKTOTAL, CKMB, CKMBINDEX, TROPONINI in the last 168 hours. BNP (last 3 results) No results for input(s): PROBNP in the last 8760 hours. HbA1C: No results for input(s): HGBA1C in the last 72 hours. CBG: Recent Labs  Lab 09/21/24 1845  GLUCAP 180*   Lipid Profile: No results for input(s): CHOL, HDL, LDLCALC, TRIG, CHOLHDL, LDLDIRECT in the last 72 hours. Thyroid  Function Tests: No results for input(s): TSH, T4TOTAL, FREET4, T3FREE, THYROIDAB in the last 72 hours. Anemia Panel: No results for input(s): VITAMINB12, FOLATE, FERRITIN, TIBC, IRON, RETICCTPCT in the last 72 hours. Urine analysis:    Component Value Date/Time   COLORURINE YELLOW  09/21/2024 1856   APPEARANCEUR CLEAR 09/21/2024 1856   LABSPEC 1.012 09/21/2024 1856   PHURINE 6.0 09/21/2024 1856   GLUCOSEU NEGATIVE 09/21/2024 1856   HGBUR SMALL (A) 09/21/2024 1856   BILIRUBINUR NEGATIVE 09/21/2024 1856   KETONESUR NEGATIVE 09/21/2024 1856   PROTEINUR NEGATIVE 09/21/2024 1856   UROBILINOGEN 1.0 08/06/2012 2046   NITRITE NEGATIVE 09/21/2024 1856   LEUKOCYTESUR NEGATIVE 09/21/2024 1856    Radiological Exams on Admission: CT Angio Chest PE W and/or Wo Contrast Result Date: 09/21/2024 EXAM:  CTA CHEST 09/21/2024 10:19:17 PM TECHNIQUE: CTA of the chest was performed after the administration of intravenous contrast. Multiplanar reformatted images are provided for review. MIP images are provided for review. Automated exposure control, iterative reconstruction, and/or weight based adjustment of the mA/kV was utilized to reduce the radiation dose to as low as reasonably achievable. COMPARISON: None available. CLINICAL HISTORY: Pulmonary embolism (PE) suspected, high prob. FINDINGS: PULMONARY ARTERIES: Pulmonary arteries are adequately opacified for evaluation. No acute pulmonary embolus. Enlargement of the main pulmonary artery, suggesting pulmonary arterial hypertension. MEDIASTINUM: Cardiomegaly. Moderate 3 vessel coronary atherosclerosis. Thoracic atherosclerosis. There is no acute abnormality of the thoracic aorta. LYMPH NODES: No mediastinal, hilar or axillary lymphadenopathy. LUNGS AND PLEURA: Mild dependent atelectasis in the right upper and bilateral lower lobes. No focal consolidation or pulmonary edema. No evidence of pleural effusion or pneumothorax. UPPER ABDOMEN: Limited images of the upper abdomen are unremarkable. SOFT TISSUES AND BONES: Mild degenerative changes of the mid/lower thoracic spine. No acute soft tissue abnormality. IMPRESSION: 1. No pulmonary embolism. 2. Cardiomegaly. 3. Suspected pulmonary arterial hypertension. Electronically signed by: Pinkie Pebbles MD  09/21/2024 10:26 PM EST RP Workstation: [id number]   CT ABDOMEN PELVIS W CONTRAST Result Date: 09/21/2024 EXAM: CT ABDOMEN AND PELVIS WITH CONTRAST 09/21/2024 10:19:17 PM TECHNIQUE: CT of the abdomen and pelvis was performed with the administration of 75 mL of iohexol  (OMNIPAQUE ) 350 MG/ML injection. Multiplanar reformatted images are provided for review. Automated exposure control, iterative reconstruction, and/or weight-based adjustment of the mA/kV was utilized to reduce the radiation dose to as low as reasonably achievable. COMPARISON: 04/08/2021 CLINICAL HISTORY: n/v. Vomiting. eval for ileus/obstruction FINDINGS: LOWER CHEST: No acute abnormality. LIVER: Mildly nodular contour. Correlate for cirrhosis. GALLBLADDER AND BILE DUCTS: Status post cholecystectomy. No biliary ductal dilatation. SPLEEN: Splenomegaly. PANCREAS: No acute abnormality. ADRENAL GLANDS: No acute abnormality. KIDNEYS, URETERS AND BLADDER: No stones in the kidneys or ureters. No hydronephrosis. No perinephric or periureteral stranding. Urinary bladder is unremarkable. GI AND BOWEL: Stomach demonstrates no acute abnormality. Scattered colonic diverticulosis, without evidence of diverticulitis. There is no bowel obstruction. PERITONEUM AND RETROPERITONEUM: No ascites. No free air. VASCULATURE: Atherosclerotic calcifications of the abdominal aorta and branch vessels, although patent. LYMPH NODES: No lymphadenopathy. REPRODUCTIVE ORGANS: Calcified uterine fibroids. BONES AND SOFT TISSUES: Mild changes of the lumbar spine. Bilateral hip arthroplasties. No focal soft tissue abnormality. IMPRESSION: 1. No acute findings. 2. Suspected cirrhosis. Splenomegaly. 3. Colonic diverticulosis, without evidence of diverticulitis. Electronically signed by: Pinkie Pebbles MD 09/21/2024 10:24 PM EST RP Workstation: [id number]   CT Head Wo Contrast Result Date: 09/21/2024 CLINICAL DATA:  Initial evaluation for acute head trauma, minor. EXAM: CT HEAD  WITHOUT CONTRAST TECHNIQUE: Contiguous axial images were obtained from the base of the skull through the vertex without intravenous contrast. RADIATION DOSE REDUCTION: This exam was performed according to the departmental dose-optimization program which includes automated exposure control, adjustment of the mA and/or kV according to patient size and/or use of iterative reconstruction technique. COMPARISON:  Prior CT from 06/10/2024. FINDINGS: Brain: Mild age-related cerebral atrophy with chronic small vessel ischemic disease. No acute intracranial hemorrhage. No acute large vessel territory infarct. No mass lesion or midline shift. No hydrocephalus or extra-axial fluid collection. Vascular: No abnormal hyperdense vessel. Calcified atherosclerosis present at skull base. Skull: Scalp soft tissues within normal limits.  Calvarium intact. Sinuses/Orbits: Globes orbital soft tissues within normal limits. Changes of chronic right maxillary sinus partially visualized. Paranasal sinuses are otherwise largely clear. No significant mastoid effusion. Other: None. IMPRESSION: 1. No  acute intracranial abnormality. 2. Mild age-related cerebral atrophy with chronic small vessel ischemic disease. Electronically Signed   By: Morene Hoard M.D.   On: 09/21/2024 19:45   DG Chest Portable 1 View Result Date: 09/21/2024 EXAM: 1 VIEW(S) XRAY OF THE CHEST 09/21/2024 07:30:00 PM COMPARISON: 07/05/2024 CLINICAL HISTORY: loc FINDINGS: LUNGS AND PLEURA: No focal pulmonary opacity. No pleural effusion. No pneumothorax. HEART AND MEDIASTINUM: Stable mild cardiomegaly. BONES AND SOFT TISSUES: No acute osseous abnormality. IMPRESSION: 1. No acute cardiopulmonary abnormality. Electronically signed by: Pinkie Pebbles MD 09/21/2024 07:39 PM EST RP Workstation: [id number]      Assessment/Plan   Principal Problem:   Cellulitis of right lower extremity Active Problems:   AKI (acute kidney injury)   HLD (hyperlipidemia)    Thrombocytopenia   Generalized weakness   Syncope   Lactic acidosis   DM2 (diabetes mellitus, type 2) (HCC)   Paroxysmal atrial fibrillation (HCC)      #) Cellulitis of the right lower extremity: Diagnosis on the basis of to 3 days of progressive right lower extremity erythema, swelling, tenderness, increased warmth, associated with subjective fever, mildly elevated presenting temperature of 99.7.  She is at increased risk of development of cellulitis in the context of a documented history of chronic venous stasis, with potential contribution from CTA evidence of pulmonary artery hypertension.  No evidence of acute focal sensory changes involving the right lower extremity, nor any evidence of crepitus to suggest necrotizing fasciitis.  In the absence of objective fever and in the absence of leukocytosis, SIRS criteria for sepsis are not currently met.  No evidence of associated purulent drainage to increase index of suspicion for underlying MRSA.  Blood cultures x 2 were collected in the ED this evening, followed by initiation of Rocephin , which will be continued for now.  Additionally, venous ultrasound of the right lower extremity is been ordered to assess for any contributory acute DVT, with result currently pending.   Plan: Follow-up results of blood cultures x 2.  Continue Rocephin .  Lactated Ringer 's at 50 cc/h x 8 hours, with repeat lactic acid level currently pending.  Prn acetaminophen  for fever or discomfort.  Monitor on telemetry.  Monitor strict I's and O's and daily weights.  Follow-up result of venous ultrasound of the right lower extremity.  Repeat CBC in the morning.                      #) Generalized weakness:  2-3 day  duration of generalized weakness, in the absence of any evidence of acute focal neurologic deficits, including no evidence of acute focal weakness to suggest acute CVA, while noting that presenting CT head showed no evidence of acute intracranial  process, including no evidence of acute infarct.  Suspect contribution from physiologic stress stemming from presenting right lower extremity cellulitis, as above.  No e/o additional infectious process at this time, including urinalysis that was inconsistent with UTI, while CTA chest showed no evidence of infiltrate to suggest pneumonia, and CT abdomen/pelvis showed no evidence of acute process.   Will further eval for any additional contributions from endocrine/metabolic sources, as detailed below.  In the setting of her presenting generalized weakness, concomitant with presenting AKI, will also hold next dose of gabapentin .  Additionally, in setting of AKI, with urinalysis showing small hemoglobin in the absence of any RBCs, will check CPK level to evaluate for any contribution from rhabdomyolysis.  Plan: work-up and management of presenting right lower extremity cellulitis, as described above.  PT/OT consults ordered for the AM. Fall precautions. CMP/CBC in the AM. Check TSH, serum Mg level. Check B12, CPK level.                       #) Syncope: 1 episode of syncope that occurred when the patient was moving from a seated to standing position after sitting on the toilet urinating and associated with prodrome, suggestive of potential orthostatic hypotension in the setting of dehydration given slight decline in oral intake over the last few days as a consequence of generalized weakness, as well as potential contribution from ostensible losses in the setting of acute infection in the form of presenting right lower extremity cellulitis, compounded by outpatient use of Lasix , and exacerbated by home pharmacologic factors serving to diminish compensatory tachycardic response in the setting of outpatient metoprolol  succinate.  Will check orthostatic vital signs, but with the caveat that the patient has already received IVF's in the ED, potentially altering the results of this evaluation.     Not associated with any overt acute focal neurologic deficits. Clinically, acute ischemic stroke versus seizures appear less likely at this time, while CT head showed no evidence of acute intracranial process. Presentation appears less consistent with ACS at this time, with serial troponin found to be nonelevated, presenting EKG showing no evidence of acute ischemic changes.  Additionally, CTA chest showed no evidence of acute cardiopulmonary process, including no evidence of acute pulmonary embolism.  Given the timing of her episode of syncope, differential would also include potential contribution from micturition syncope.   Plan: I have placed a nursing communication order requesting that orthostatic vital signs x 1 set be checked and documented. gentle IVF's in the form of lactated Ringer 's at 50 cc/h x 8 hours. Monitor on telemetry.  Hold next dose of Lasix .  Monitor strict I's and O's.  Add-on serum Mg level. Check CMP, CBC, serum Mg level in the AM. Fall precautions ordered.                      #) Lactic acidosis: Mildly elevated initial lactate of 3.4, which is down from most recent prior value of 4.5 when checked on 07/05/2024.  As noted above, in the absence of objective fever and in the absence of leukocytosis, SIRS criteria for sepsis are not currently met.  However, potential contributions towards her mildly elevated lactate are multifactorial in nature, including clinical suspicion for mild dehydration, as well as potential contribution from presenting acute kidney injury as well as contribution from transient generalized hypoperfusion in the setting of her single syncopal episode earlier today.  Additionally, will check CPK level given urinalysis that showed small hemoglobin in the absence of any RBCs.  No additional source of underlying infectious process, as further outlined above.  No clinical evidence to suggest seizures.  Of note, the patient has received 1.5 L NS  bolus in the ED.   I suspect that the patient's mild shortness of breath over the last 1 to 2 days may be compensatory for underlying metabolic acidosis in the setting of her lactic acidosis.  Will check blood gas to further assess.  Plan: Gentle IV fluids, as above.  Follow-up for result of repeat lactic acid level.  Check INR to evaluate hepatic synthetic function.  Monitor strict I's and O's and daily weights.  Check CPK level, serum ethanol level, urinary drug screen.  Repeat CMP, CBC.  Check VBG, as above.SABRA                     #)  Pseudohyperkalemia: Presenting CMP shows very mild elevation in potassium level at 5.2.  However, this appears to be in the setting of a hemolyzed specimen.  Will recheck 0 potassium level, as below.  No evidence of acute EKG findings consistent with hyperkalemia.   Plan: Repeat CMP.  Check serum magnesium  level.  Monitor strict I's and O's and daily weights.                      #) Acute Kidney Injury:   Presenting creatinine 1.20 compared to most recent prior value of 0.88 on 07/08/2024.  Suspect prerenal contributions from decline in oral intake over the last 2 to 3 days as a consequence of her generalized weakness as well as potential contribution from ostensible fluid losses in the setting of her presenting right lower extremity cellulitis, potentially compounded by outpatient use of Lasix .  Presenting urinalysis showed small hemoglobin in the absence of any RBCs.  In light of this, will check CPK level.  Otherwise, no evidence of significant urinary casts on today's urinalysis with microscopy.  Will pursue gentle additional IV fluids and additional diagnostic evaluation along with further trending in renal function as outlined below.  Of note, today CT abdomen/pelvis showed no evidence of postrenal obstruction.  Plan: monitor strict I's & O's and daily weights. Attempt to avoid nephrotoxic agents.  Hold next dose of Lasix .   Refrain from NSAIDs. Repeat CMP in the morning. Check serum magnesium  level. Add-on random urine sodium and random urine creatinine.  Check CPK level.  Lactated Ringer 's at 50 cc/h x 8 hours.                      #) Type 2 Diabetes Mellitus: documented history of such, and managed via lifestyle modifications in the absence of any outpatient use of insulin  or oral hypoglycemic agents, noting most recent hemoglobin A1c level to be 5.2% when checked on 06/10/2024.  Presenting glucose via CMP noted to be 177, with likely relative hyperglycemic contribution as a result of acute infection in the form of presenting right lower extremity cellulitis.  Her diabetes is also complicated by history of diabetic peripheral polyneuropathy for which she is on gabapentin  at home.  In the setting of her generalized weakness, lethargy, concomitant with acute kidney injury, will hold next dose of gabapentin .   Plan: accuchecks QAC and HS with low dose SSI.  Add on hemoglobin A1c level.  Hold next dose of gabapentin , as above.                        #) Paroxysmal atrial fibrillation: Documented history of such. In setting of CHA2DS2-VASc score of  5, there is an indication for chronic anticoagulation for thromboembolic prophylaxis.  However, it is noted that the patient is not on any formal anticoagulation at home .  Will pursue additional chart review to attempt to ascertain rationale for this however, I suspect that there may be contributing factors that include her Jehovah's Witness status as well as a history of chronic thrombocytopenia .  Home AV nodal blocking regimen: Metoprolol  succinate 12.5 mg p.o. daily.  Most recent echocardiogram occurred in June 2023, with results that included moderately dilated left atrium, with additional results as conveyed above. Presenting EKG shows sinus rhythm without overt evidence of acute ischemic changes.   Plan: monitor strict I's & O's and daily  weights. CMP/CBC in AM. Check serum mag level. Continue home AV  nodal blocking regimen.  Monitor on telemetry.                       #) Hyperlipidemia: documented h/o such. On rosuvastatin  as outpatient.   Plan: continue home statin.  Follow for result of CPK level, as above.                      #) Chronic thrombocytopenia: Documented history of such, with baseline platelet range of 65-1 20 dating back to March 2024.  Presenting CBC demonstrates platelet count of 98, consistent with his baseline range.  Plan: Repeat CBC in the morning.  Additionally, in the setting of presenting acute infection, will also check INR.      DVT prophylaxis: SCD's   Code Status: Full code Family Communication: none Disposition Plan: Per Rounding Team Consults called: none;  Admission status: inpatient     I SPENT GREATER THAN 75  MINUTES IN CLINICAL CARE TIME/MEDICAL DECISION-MAKING IN COMPLETING THIS ADMISSION.      Eva NOVAK Juliana Boling DO [facility]  From 7PM - 7AM   09/21/2024, 11:25 PM

## 2024-09-21 NOTE — ED Provider Triage Note (Signed)
 Emergency Medicine Provider Triage Evaluation Note  Kathleen Wiley , a 78 y.o. female  was evaluated in triage.  Pt complains of syncope. Felt bad earlier today and fell to the ground nearly passing out.  Did not hit head of LOC.  Now just not feeling well.  Denies headache, cp, sob, abd pain, back pain, focal numbness or focal weakness. Denies urinary sxs.  Not on blood thinner med. Was found to be hypoxic at 89% on RA initially.  Pt denies sob.   Review of Systems  Positive: As above Negative: As above  Physical Exam  BP (!) 128/58   Pulse (!) 105   Temp 99.7 F (37.6 C)   Resp 18   SpO2 98%  Gen:   Drowsy but able to answer question   Resp:  Normal effort  MSK:   Moves extremities without difficulty  Other:    Medical Decision Making  Medically screening exam initiated at 8:42 PM.  Appropriate orders placed.  Kathleen Wiley was informed that the remainder of the evaluation will be completed by another provider, this initial triage assessment does not replace that evaluation, and the importance of remaining in the ED until their evaluation is complete.  Will need to r/o PE   Nivia Colon, PA-C 09/21/24 2045

## 2024-09-21 NOTE — ED Provider Notes (Signed)
 [place] EMERGENCY DEPARTMENT AT [facility] Provider Note   CSN: [id number] Arrival date & time: 09/21/24  1805     Patient presents with: No chief complaint on file.   Kathleen Wiley is a 78 y.o. female.   HPI    Patient presents because of syncope.  Patient states that she has been feeling ill over the past couple days.  She has been having episodes of vomiting.  No obvious diarrhea that she endorses.  Patient states that she is felt like this in the past whenever she had cellulitis of her lower extremities.  Most recent episode of cellulitis involves her right lower extremity.  Endorses fatigue.  Vertigo with movement as well as standing.  No chest pain.  No significant shortness of breath.  No clear chest pain or hemoptysis.  Just endorses feeling very fatigued.  No new numbness or tingling aware.  No diplopia.  Generalized weakness without  focal weakness.  No sick contacts that she is aware of  Previous medical history reviewed : Patient was discharged on July 08, 2024.history of MDS with pancytopenia, paroxysmal A-fib not on anticoagulation, HTN, HLD, DM 2.  Sepsis secondary to rule out cellulitis of right lower extremity.   Prior to Admission medications   Medication Sig Start Date End Date Taking? Authorizing Provider  cefadroxil  (DURICEF) 500 MG capsule Take 1 capsule (500 mg total) by mouth 2 (two) times daily. To be taken at very first sign of cellulitis 07/23/24   Fleeta Rothman, Jomarie SAILOR, MD  Cholecalciferol  (VITAMIN D-3 PO) Take 1 capsule by mouth daily.    [provider]  Cyanocobalamin  (VITAMIN B-12 PO) Take 1 tablet by mouth daily.    [provider]  escitalopram  (LEXAPRO ) 20 MG tablet Take 20 mg by mouth at bedtime.    [provider]  furosemide  (LASIX ) 20 MG tablet Take 20 mg by mouth daily. 09/23/19   [provider]  gabapentin  (NEURONTIN ) 300 MG capsule Take 300 mg by mouth 2 (two) times daily. 07/25/19   [provider]  metoprolol  succinate (TOPROL  XL) 25 MG 24 hr tablet Take 0.5 tablets (12.5 mg total) by mouth daily. 05/06/22   Vann, Jessica U, DO  omeprazole (PRILOSEC) 20 MG capsule Take 20 mg by mouth daily.    [provider]  potassium chloride  (KLOR-CON ) 10 MEQ tablet Take 10 mEq by mouth daily.    [provider]  rosuvastatin  (CRESTOR ) 5 MG tablet Take 5 mg by mouth at bedtime.    [provider]    Allergies: Other, Ditropan  [oxybutynin ], Flagyl [metronidazole], Lipitor [atorvastatin], Cefepime , Septra [sulfamethoxazole-trimethoprim], and Vancomycin     Review of Systems  Constitutional:  Negative for chills and fever.  HENT:  Negative for ear pain and sore throat.   Eyes:  Negative for pain and visual disturbance.  Respiratory:  Negative for cough and shortness of breath.   Cardiovascular:  Negative for chest pain and palpitations.  Gastrointestinal:  Negative for abdominal pain and vomiting.  Genitourinary:  Negative for dysuria and hematuria.  Musculoskeletal:  Negative for arthralgias and back pain.  Skin:  Negative for color change and rash.  Neurological:  Negative for seizures and syncope.  All other systems reviewed and are negative.   Updated Vital Signs BP (!) 107/47   Pulse 86   Temp 99.7 F (37.6 C)   Resp 15   SpO2 100%   Physical Exam Vitals and nursing note reviewed.  Constitutional:  General: She is not in acute distress.    Appearance: She is well-developed.  HENT:     Head: Normocephalic and atraumatic.  Eyes:     Conjunctiva/sclera: Conjunctivae normal.  Cardiovascular:     Rate and Rhythm: Normal rate and regular rhythm.     Heart sounds: No murmur heard. Pulmonary:     Effort: Pulmonary effort is normal. No respiratory distress.     Breath sounds: Normal breath sounds.  Abdominal:     Palpations: Abdomen is soft.     Tenderness: There is no abdominal tenderness.  Musculoskeletal:        General: No swelling.      Cervical back: Neck supple.       Legs:  Skin:    General: Skin is warm and dry.     Capillary Refill: Capillary refill takes less than 2 seconds.  Neurological:     Mental Status: She is alert.  Psychiatric:        Mood and Affect: Mood normal.      Media Information  Document Information  Photos    09/21/2024 22:59  Attached To:  Hospital Encounter on 09/21/24  Source Information  Simon Lavonia SAILOR, MD  [facility]      (all labs ordered are listed, but only abnormal results are displayed) Labs Reviewed  COMPREHENSIVE METABOLIC PANEL WITH GFR - Abnormal; Notable for the following components:      Result Value   Potassium 5.2 (*)    CO2 19 (*)    Glucose, Bld 177 (*)    Creatinine, Ser 1.20 (*)    Calcium  8.7 (*)    Albumin  3.4 (*)    AST 60 (*)    Total Bilirubin 1.9 (*)    GFR, Estimated 47 (*)    All other components within normal limits  CBC - Abnormal; Notable for the following components:   RBC 3.78 (*)    Platelets 98 (*)    nRBC 0.4 (*)    All other components within normal limits  URINALYSIS, ROUTINE W REFLEX MICROSCOPIC - Abnormal; Notable for the following components:   Hgb urine dipstick SMALL (*)    All other components within normal limits  CBG MONITORING, ED - Abnormal; Notable for the following components:   Glucose-Capillary 180 (*)    All other components within normal limits  I-STAT CG4 LACTIC ACID, ED - Abnormal; Notable for the following components:   Lactic Acid, Venous 3.4 (*)    All other components within normal limits  RESP PANEL BY RT-PCR (RSV, FLU A&B, COVID)  [id number]  CULTURE, BLOOD (ROUTINE X 2)  CULTURE, BLOOD (ROUTINE X 2)  I-STAT CG4 LACTIC ACID, ED  TROPONIN I (HIGH SENSITIVITY)  TROPONIN I (HIGH SENSITIVITY)    EKG: EKG Interpretation Date/Time:  Friday September 21 2024 18:24:56 EST Ventricular Rate:  104 PR Interval:  182 QRS Duration:  88 QT Interval:  350 QTC Calculation: 460 R Axis:   -31  Text  Interpretation: Sinus tachycardia Left axis deviation Cannot rule out Anterior infarct , age undetermined Abnormal ECG When compared with ECG of 05-Jul-2024 18:05, PREVIOUS ECG IS PRESENT Confirmed by Simon Lavonia [phone]) on 09/21/2024 9:38:42 PM  Radiology: CT Angio Chest PE W and/or Wo Contrast Result Date: 09/21/2024 EXAM: CTA CHEST 09/21/2024 10:19:17 PM TECHNIQUE: CTA of the chest was performed after the administration of intravenous contrast. Multiplanar reformatted images are provided for review. MIP images are provided for review. Automated exposure control, iterative reconstruction, and/or  weight based adjustment of the mA/kV was utilized to reduce the radiation dose to as low as reasonably achievable. COMPARISON: None available. CLINICAL HISTORY: Pulmonary embolism (PE) suspected, high prob. FINDINGS: PULMONARY ARTERIES: Pulmonary arteries are adequately opacified for evaluation. No acute pulmonary embolus. Enlargement of the main pulmonary artery, suggesting pulmonary arterial hypertension. MEDIASTINUM: Cardiomegaly. Moderate 3 vessel coronary atherosclerosis. Thoracic atherosclerosis. There is no acute abnormality of the thoracic aorta. LYMPH NODES: No mediastinal, hilar or axillary lymphadenopathy. LUNGS AND PLEURA: Mild dependent atelectasis in the right upper and bilateral lower lobes. No focal consolidation or pulmonary edema. No evidence of pleural effusion or pneumothorax. UPPER ABDOMEN: Limited images of the upper abdomen are unremarkable. SOFT TISSUES AND BONES: Mild degenerative changes of the mid/lower thoracic spine. No acute soft tissue abnormality. IMPRESSION: 1. No pulmonary embolism. 2. Cardiomegaly. 3. Suspected pulmonary arterial hypertension. Electronically signed by: Pinkie Pebbles MD 09/21/2024 10:26 PM EST RP Workstation: [id number]   CT ABDOMEN PELVIS W CONTRAST Result Date: 09/21/2024 EXAM: CT ABDOMEN AND PELVIS WITH CONTRAST 09/21/2024 10:19:17 PM TECHNIQUE: CT of the  abdomen and pelvis was performed with the administration of 75 mL of iohexol  (OMNIPAQUE ) 350 MG/ML injection. Multiplanar reformatted images are provided for review. Automated exposure control, iterative reconstruction, and/or weight-based adjustment of the mA/kV was utilized to reduce the radiation dose to as low as reasonably achievable. COMPARISON: 04/08/2021 CLINICAL HISTORY: n/v. Vomiting. eval for ileus/obstruction FINDINGS: LOWER CHEST: No acute abnormality. LIVER: Mildly nodular contour. Correlate for cirrhosis. GALLBLADDER AND BILE DUCTS: Status post cholecystectomy. No biliary ductal dilatation. SPLEEN: Splenomegaly. PANCREAS: No acute abnormality. ADRENAL GLANDS: No acute abnormality. KIDNEYS, URETERS AND BLADDER: No stones in the kidneys or ureters. No hydronephrosis. No perinephric or periureteral stranding. Urinary bladder is unremarkable. GI AND BOWEL: Stomach demonstrates no acute abnormality. Scattered colonic diverticulosis, without evidence of diverticulitis. There is no bowel obstruction. PERITONEUM AND RETROPERITONEUM: No ascites. No free air. VASCULATURE: Atherosclerotic calcifications of the abdominal aorta and branch vessels, although patent. LYMPH NODES: No lymphadenopathy. REPRODUCTIVE ORGANS: Calcified uterine fibroids. BONES AND SOFT TISSUES: Mild changes of the lumbar spine. Bilateral hip arthroplasties. No focal soft tissue abnormality. IMPRESSION: 1. No acute findings. 2. Suspected cirrhosis. Splenomegaly. 3. Colonic diverticulosis, without evidence of diverticulitis. Electronically signed by: Pinkie Pebbles MD 09/21/2024 10:24 PM EST RP Workstation: [id number]   CT Head Wo Contrast Result Date: 09/21/2024 CLINICAL DATA:  Initial evaluation for acute head trauma, minor. EXAM: CT HEAD WITHOUT CONTRAST TECHNIQUE: Contiguous axial images were obtained from the base of the skull through the vertex without intravenous contrast. RADIATION DOSE REDUCTION: This exam was performed  according to the departmental dose-optimization program which includes automated exposure control, adjustment of the mA and/or kV according to patient size and/or use of iterative reconstruction technique. COMPARISON:  Prior CT from 06/10/2024. FINDINGS: Brain: Mild age-related cerebral atrophy with chronic small vessel ischemic disease. No acute intracranial hemorrhage. No acute large vessel territory infarct. No mass lesion or midline shift. No hydrocephalus or extra-axial fluid collection. Vascular: No abnormal hyperdense vessel. Calcified atherosclerosis present at skull base. Skull: Scalp soft tissues within normal limits.  Calvarium intact. Sinuses/Orbits: Globes orbital soft tissues within normal limits. Changes of chronic right maxillary sinus partially visualized. Paranasal sinuses are otherwise largely clear. No significant mastoid effusion. Other: None. IMPRESSION: 1. No acute intracranial abnormality. 2. Mild age-related cerebral atrophy with chronic small vessel ischemic disease. Electronically Signed   By: Morene Hoard M.D.   On: 09/21/2024 19:45   DG Chest Portable 1 View Result Date:  09/21/2024 EXAM: 1 VIEW(S) XRAY OF THE CHEST 09/21/2024 07:30:00 PM COMPARISON: 07/05/2024 CLINICAL HISTORY: loc FINDINGS: LUNGS AND PLEURA: No focal pulmonary opacity. No pleural effusion. No pneumothorax. HEART AND MEDIASTINUM: Stable mild cardiomegaly. BONES AND SOFT TISSUES: No acute osseous abnormality. IMPRESSION: 1. No acute cardiopulmonary abnormality. Electronically signed by: Pinkie Pebbles MD 09/21/2024 07:39 PM EST RP Workstation: [id number]     Procedures   Medications Ordered in the ED  cefTRIAXone  (ROCEPHIN ) 1 g in sodium chloride  0.9 % 100 mL IVPB (1 g Intravenous New Bag/Given 09/21/24 2246)  sodium chloride  0.9 % bolus 500 mL (has no administration in time range)  sodium chloride  0.9 % bolus 1,000 mL (0 mLs Intravenous Stopped 09/21/24 2111)  ondansetron  (ZOFRAN ) injection 4 mg  (4 mg Intravenous Given 09/21/24 1853)  iohexol  (OMNIPAQUE ) 350 MG/ML injection 75 mL (75 mLs Intravenous Contrast Given 09/21/24 2221)                                    Medical Decision Making Amount and/or Complexity of Data Reviewed Labs: ordered. Radiology: ordered.     HPI:   Patient presents because of syncope.  Patient states that she has been feeling ill over the past couple days.  She has been having episodes of vomiting.  No obvious diarrhea that she endorses.  Patient states that she is felt like this in the past whenever she had cellulitis of her lower extremities.  Most recent episode of cellulitis involves her right lower extremity.  Endorses fatigue.  Vertigo with movement as well as standing.  No chest pain.  No significant shortness of breath.  No clear chest pain or hemoptysis.  Just endorses feeling very fatigued.  No new numbness or tingling aware.  No diplopia.  Generalized weakness without  focal weakness.  No sick contacts that she is aware of  Previous medical history reviewed : Patient was discharged on July 08, 2024.history of MDS with pancytopenia, paroxysmal A-fib not on anticoagulation, HTN, HLD, DM 2.  Sepsis secondary to rule out cellulitis of right lower extremity.  MDM:   Upon exam, patient ANO x 3 GCS 15.  NIH is 0.  Cranials 2 through 12 intact.  Endorsing vertigo over the past 24 to 36 hours no concerns for CVA at this point time.  No concerns for large vessel occlusion.  Outside window for any TNK administration.  CT scan was obtained in triage given fall.  Negative.   Patient does have swelling to the right lower extremity.  No history of DVT or PE.  Patient has had borderline hypoxia couple different times.  Will obtain CTA of the chest as well as CT abdomen pelvis.  Will rule out PE.  Also obtain CT scan of the abdomen pelvis given borderline temperature elevation weakness and some borderline low blood pressures.  Will assess for evidence of ileus  or obstruction in the setting of ongoing nausea and vomiting   Laboratory workup showed no obvious leukocytosis.  Platelet count 98 which is around her baseline.  Chronic thrombocytopenia.  Creatinine 1.2 which is around her baseline.  Slightly acidotic with potassium 5.2.  No EKG changes.   Concern for possible cellulitis of the right lower extremity.  Given patient's weakness lethargy.  Will obtain lactic acid as well as blood cultures and start patient on ceftriaxone  given my concern.  Will also obtain a DVT ultrasound of this right lower extremity.  Patient has appropriate 2+ dorsal pedal pulses and posterior tibial pulses.  No concerns for the kind of ischemic pathology at this time.  She has chronic bruising and petechiae of this right leg that she states has not changed in nature    Reevaluation:   Upon reexamination, patient hemodynamically stable.  Remains A&O x 3 with GCS 15.  Reviewed patient's imaging.  CTA negative for PE.  No infiltrate show some cardiomegaly with pulmonary hypertension.  CT abdomen showed no ileus obstruction.  There is also evidence of cirrhosis.  Patient does have an elevated lactic acid.  Concern for cellulitis given appearance of right lower extremity as well as given patient's overall appearance.  Patient had been started on ceftriaxone  to cover for cellulitic changes.  No recent positive blood cultures or evidence of MRSA in the past.  Will hold on MRSA coverage with vancomycin  at this point time.  Compartments soft.  No concern for compartment syndrome this time.  No pain out of proportion.  Will not give patient typical 30 cc/kg bolus of fluid but started with 1000 cc and will give another 500 cc continue to monitor patient's vital signs.  Regards to ongoing fluid resuscitation.  Syncope likely in setting of infection with borderline hypotension   Interventions: ceftriaxone   EKG Interpreted by Me: sinus    Cardiac Tele Interpreted by Me: sinus     I have independently interpreted the CXR  and CT  images and agree with the radiologist finding     Disposition and Follow Up: admit      Final diagnoses:  Cellulitis of right lower extremity  Lactic acidosis  Syncope and collapse    ED Discharge Orders     None          Simon Lavonia SAILOR, MD 09/21/24 2311

## 2024-09-21 NOTE — ED Notes (Signed)
 Patient transported to CT

## 2024-09-21 NOTE — ED Triage Notes (Addendum)
 Pt BIB [facility] from home for syncopal epidsoe when getting up to walk to the bathroom.  89% RA, 91% 4L.  She doesn't feel SOB but hasn't felt well for a couple days.  SHe does seem to have exertional dyspnea. She endorsed dizziness before and after the syncopal episode.  EMS endorses pt feels warm to touch. EMS noted urinary incontinence during syncope.   99.3 oral, 140/62 HR 106, CBG 194

## 2024-09-22 ENCOUNTER — Other Ambulatory Visit: Payer: Self-pay

## 2024-09-22 ENCOUNTER — Inpatient Hospital Stay (HOSPITAL_COMMUNITY)

## 2024-09-22 DIAGNOSIS — I480 Paroxysmal atrial fibrillation: Secondary | ICD-10-CM | POA: Diagnosis present

## 2024-09-22 DIAGNOSIS — R52 Pain, unspecified: Secondary | ICD-10-CM | POA: Diagnosis not present

## 2024-09-22 DIAGNOSIS — E872 Acidosis, unspecified: Secondary | ICD-10-CM | POA: Diagnosis present

## 2024-09-22 DIAGNOSIS — M7989 Other specified soft tissue disorders: Secondary | ICD-10-CM | POA: Diagnosis not present

## 2024-09-22 DIAGNOSIS — L538 Other specified erythematous conditions: Secondary | ICD-10-CM | POA: Diagnosis not present

## 2024-09-22 DIAGNOSIS — R531 Weakness: Secondary | ICD-10-CM

## 2024-09-22 DIAGNOSIS — L03115 Cellulitis of right lower limb: Secondary | ICD-10-CM | POA: Diagnosis not present

## 2024-09-22 DIAGNOSIS — R55 Syncope and collapse: Secondary | ICD-10-CM | POA: Diagnosis present

## 2024-09-22 DIAGNOSIS — E119 Type 2 diabetes mellitus without complications: Secondary | ICD-10-CM

## 2024-09-22 LAB — CBC WITH DIFFERENTIAL/PLATELET
Abs Immature Granulocytes: 0.24 K/uL — ABNORMAL HIGH (ref 0.00–0.07)
Basophils Absolute: 0.1 K/uL (ref 0.0–0.1)
Basophils Relative: 2 %
Eosinophils Absolute: 0 K/uL (ref 0.0–0.5)
Eosinophils Relative: 0 %
HCT: 30.6 % — ABNORMAL LOW (ref 36.0–46.0)
Hemoglobin: 10.1 g/dL — ABNORMAL LOW (ref 12.0–15.0)
Immature Granulocytes: 5 %
Lymphocytes Relative: 9 %
Lymphs Abs: 0.4 K/uL — ABNORMAL LOW (ref 0.7–4.0)
MCH: 32.8 pg (ref 26.0–34.0)
MCHC: 33 g/dL (ref 30.0–36.0)
MCV: 99.4 fL (ref 80.0–100.0)
Monocytes Absolute: 0.1 K/uL (ref 0.1–1.0)
Monocytes Relative: 3 %
Neutro Abs: 3.7 K/uL (ref 1.7–7.7)
Neutrophils Relative %: 81 %
Platelets: 89 K/uL — ABNORMAL LOW (ref 150–400)
RBC: 3.08 MIL/uL — ABNORMAL LOW (ref 3.87–5.11)
RDW: 14.6 % (ref 11.5–15.5)
Smear Review: NORMAL
WBC: 4.6 K/uL (ref 4.0–10.5)
nRBC: 0 % (ref 0.0–0.2)

## 2024-09-22 LAB — BLOOD GAS, VENOUS
Acid-Base Excess: 2.7 mmol/L — ABNORMAL HIGH (ref 0.0–2.0)
Bicarbonate: 27.9 mmol/L (ref 20.0–28.0)
O2 Saturation: 75.5 %
Patient temperature: 37
pCO2, Ven: 44 mmHg (ref 44–60)
pH, Ven: 7.41 (ref 7.25–7.43)
pO2, Ven: 44 mmHg (ref 32–45)

## 2024-09-22 LAB — COMPREHENSIVE METABOLIC PANEL WITH GFR
ALT: 29 U/L (ref 0–44)
AST: 32 U/L (ref 15–41)
Albumin: 2.7 g/dL — ABNORMAL LOW (ref 3.5–5.0)
Alkaline Phosphatase: 50 U/L (ref 38–126)
Anion gap: 10 (ref 5–15)
BUN: 24 mg/dL — ABNORMAL HIGH (ref 8–23)
CO2: 23 mmol/L (ref 22–32)
Calcium: 7.8 mg/dL — ABNORMAL LOW (ref 8.9–10.3)
Chloride: 103 mmol/L (ref 98–111)
Creatinine, Ser: 1.11 mg/dL — ABNORMAL HIGH (ref 0.44–1.00)
GFR, Estimated: 51 mL/min — ABNORMAL LOW (ref >60)
Glucose, Bld: 134 mg/dL — ABNORMAL HIGH (ref 70–99)
Potassium: 4.1 mmol/L (ref 3.5–5.1)
Sodium: 136 mmol/L (ref 135–145)
Total Bilirubin: 1.3 mg/dL — ABNORMAL HIGH (ref 0.0–1.2)
Total Protein: 5.5 g/dL — ABNORMAL LOW (ref 6.5–8.1)

## 2024-09-22 LAB — I-STAT CG4 LACTIC ACID, ED: Lactic Acid, Venous: 1.6 mmol/L (ref 0.5–1.9)

## 2024-09-22 LAB — PROTIME-INR
INR: 1.4 — ABNORMAL HIGH (ref 0.8–1.2)
Prothrombin Time: 17.9 s — ABNORMAL HIGH (ref 11.4–15.2)

## 2024-09-22 LAB — GLUCOSE, CAPILLARY
Glucose-Capillary: 148 mg/dL — ABNORMAL HIGH (ref 70–99)
Glucose-Capillary: 128 mg/dL — ABNORMAL HIGH (ref 70–99)

## 2024-09-22 LAB — HEMOGLOBIN A1C
Hgb A1c MFr Bld: 4.6 % — ABNORMAL LOW (ref 4.8–5.6)
Mean Plasma Glucose: 85.32 mg/dL

## 2024-09-22 LAB — ETHANOL: Alcohol, Ethyl (B): <15 mg/dL (ref <15)

## 2024-09-22 LAB — MAGNESIUM
Magnesium: 1.7 mg/dL (ref 1.7–2.4)
Magnesium: 1.5 mg/dL — ABNORMAL LOW (ref 1.7–2.4)

## 2024-09-22 LAB — CBG MONITORING, ED
Glucose-Capillary: 114 mg/dL — ABNORMAL HIGH (ref 70–99)
Glucose-Capillary: 111 mg/dL — ABNORMAL HIGH (ref 70–99)

## 2024-09-22 LAB — VITAMIN B12: Vitamin B-12: 748 pg/mL (ref 180–914)

## 2024-09-22 LAB — CK: Total CK: 234 U/L (ref 38–234)

## 2024-09-22 LAB — TSH: TSH: 2.44 u[IU]/mL (ref 0.350–4.500)

## 2024-09-22 MED ORDER — SODIUM CHLORIDE 0.9 % IV BOLUS
1000.0000 mL | Freq: Once | INTRAVENOUS | Status: AC
  Administered 2024-09-22: 1000 mL via INTRAVENOUS

## 2024-09-22 MED ORDER — EPINEPHRINE 0.3 MG/0.3ML IJ SOAJ
0.3000 mg | Freq: Once | INTRAMUSCULAR | Status: AC
  Administered 2024-09-22: 0.3 mg via INTRAMUSCULAR
  Filled 2024-09-22: qty 0.3

## 2024-09-22 MED ORDER — EPINEPHRINE 1 MG/10ML IV SOSY
PREFILLED_SYRINGE | INTRAVENOUS | Status: AC
  Filled 2024-09-22: qty 10

## 2024-09-22 MED ORDER — ROSUVASTATIN CALCIUM 5 MG PO TABS
5.0000 mg | ORAL_TABLET | Freq: Every day | ORAL | Status: DC
  Administered 2024-09-22 – 2024-09-26 (×5): 5 mg via ORAL
  Filled 2024-09-22 (×5): qty 1

## 2024-09-22 MED ORDER — METOPROLOL SUCCINATE ER 25 MG PO TB24
12.5000 mg | ORAL_TABLET | Freq: Every day | ORAL | Status: DC
  Administered 2024-09-22: 12.5 mg via ORAL
  Filled 2024-09-22: qty 1

## 2024-09-22 MED ORDER — MENTHOL 3 MG MT LOZG
1.0000 | LOZENGE | OROMUCOSAL | Status: DC | PRN
  Filled 2024-09-22: qty 9

## 2024-09-22 MED ORDER — SODIUM CHLORIDE 0.9 % IV BOLUS
1000.0000 mL | Freq: Once | INTRAVENOUS | Status: AC
  Administered 2024-09-23: 1000 mL via INTRAVENOUS

## 2024-09-22 MED ORDER — INSULIN ASPART 100 UNIT/ML IJ SOLN: 0.0000 [IU] | Freq: Every day | INTRAMUSCULAR | Status: DC

## 2024-09-22 MED ORDER — PHENOL 1.4 % MT LIQD
1.0000 | OROMUCOSAL | Status: DC | PRN
  Administered 2024-09-22: 1 via OROMUCOSAL
  Filled 2024-09-22: qty 177

## 2024-09-22 MED ORDER — CAMPHOR-MENTHOL 0.5-0.5 % EX LOTN
TOPICAL_LOTION | CUTANEOUS | Status: DC | PRN
  Filled 2024-09-22: qty 222

## 2024-09-22 MED ORDER — DIPHENHYDRAMINE HCL 50 MG/ML IJ SOLN
50.0000 mg | Freq: Once | INTRAMUSCULAR | Status: AC
  Administered 2024-09-22: 50 mg via INTRAVENOUS
  Filled 2024-09-22: qty 1

## 2024-09-22 MED ORDER — METHYLPREDNISOLONE SODIUM SUCC 125 MG IJ SOLR
125.0000 mg | Freq: Once | INTRAMUSCULAR | Status: AC
  Administered 2024-09-22: 125 mg via INTRAVENOUS
  Filled 2024-09-22: qty 2

## 2024-09-22 MED ORDER — MAGNESIUM SULFATE 2 GM/50ML IV SOLN
2.0000 g | Freq: Once | INTRAVENOUS | Status: AC
  Administered 2024-09-22: 2 g via INTRAVENOUS
  Filled 2024-09-22: qty 50

## 2024-09-22 MED ORDER — INSULIN ASPART 100 UNIT/ML IJ SOLN
0.0000 [IU] | Freq: Three times a day (TID) | INTRAMUSCULAR | Status: DC
  Administered 2024-09-22: 1 [IU] via SUBCUTANEOUS
  Administered 2024-09-23: 5 [IU] via SUBCUTANEOUS
  Administered 2024-09-23: 3 [IU] via SUBCUTANEOUS
  Administered 2024-09-23: 2 [IU] via SUBCUTANEOUS
  Administered 2024-09-24 – 2024-09-25 (×2): 1 [IU] via SUBCUTANEOUS
  Administered 2024-09-26: 2 [IU] via SUBCUTANEOUS
  Filled 2024-09-22: qty 3
  Filled 2024-09-22: qty 2
  Filled 2024-09-22: qty 1
  Filled 2024-09-22: qty 2
  Filled 2024-09-22 (×2): qty 1
  Filled 2024-09-22: qty 5

## 2024-09-22 MED ORDER — LINEZOLID 600 MG/300ML IV SOLN
600.0000 mg | Freq: Two times a day (BID) | INTRAVENOUS | Status: DC
  Administered 2024-09-22 – 2024-09-23 (×2): 600 mg via INTRAVENOUS
  Filled 2024-09-22 (×2): qty 300

## 2024-09-22 MED ORDER — GABAPENTIN 300 MG PO CAPS
300.0000 mg | ORAL_CAPSULE | Freq: Two times a day (BID) | ORAL | Status: DC
  Administered 2024-09-22 – 2024-09-26 (×9): 300 mg via ORAL
  Filled 2024-09-22 (×9): qty 1

## 2024-09-22 MED ORDER — PANTOPRAZOLE SODIUM 40 MG PO TBEC
40.0000 mg | DELAYED_RELEASE_TABLET | Freq: Every day | ORAL | Status: DC
  Administered 2024-09-22 – 2024-09-26 (×5): 40 mg via ORAL
  Filled 2024-09-22 (×5): qty 1

## 2024-09-22 NOTE — Progress Notes (Signed)
 PROGRESS NOTE  Kathleen Wiley  DOB: Sep 28, 1946  PCP: Dyane Anthony RAMAN, FNP FMW:[id number]  DOA: 09/21/2024  LOS: 1 day  Hospital Day: 2  Subjective: Patient was seen and examined this morning.  Pleasant elderly Caucasian female.  Lying on bed.  Not in distress.  No family at bedside. Was getting ultrasound of her lower extremities done. Continued to spike low-grade fever overnight Blood pressure 90s this morning,  Brief narrative: Kathleen Wiley is a 78 y.o. female with PMH significant for DM2, HTN, HLD, OSA, A-fib not on anticoagulation, chronic thrombocytopenia, refusal to transfusion ([employer]), GERD, arthritis, diabetic neuropathy 11/14, brought to ED by EMS for progressive generalized weakness for the last few days culminating to an episode of syncope.    Reports progressive generalized weakness for the last few days, progressively worsening redness, pain on her right leg.  On the day of presentation, patient was standing up to go to the bathroom, felt dizzy, lightheaded and passed out.  She fell forward, believes she hit her head on the bathroom floor. EMS noted urinary incontinence.  In the ED, patient was initially afebrile but later he spiked a fever of 101, mild tachycardia, blood pressure 122/58, breathing 3 L Initial labs with WBC count 4.6, platelet 98, lactic acid elevated 3.4, CK level normal, troponin normal Respiratory virus panel unremarkable Urinalysis with clear urine, with no bacteria Chest x-ray unremarkable EKG with sinus tachycardia at 104 BPM, QTc 460 ms  CT head did not show any acute intracranial abnormality, showed mild generalized atrophy and chronic ischemic small vessel disease CT angio chest did not show pulm embolism, showed mild cardiomegaly and evidence of pulm artery hypertension CT abdomen pelvis did not show any acute abnormality, raise suspicion of cirrhosis, splenomegaly and colonic diverticulosis without diverticulitis Blood culture  sent Patient was given IV fluid, started on IV Rocephin  Admitted to [facility]  Assessment and plan: Sepsis POA right lower EXTR cellulitis Presented with progressive redness, swelling and pain of right lower extremity Picture attached. In the ED, had fever, tachycardia, lactic acidosis but normal WBC count Blood culture sent Started on IV Rocephin  Lactic acid level normalized Continue to monitor Ultrasound duplex to rule out DVT Recent Labs  Lab 09/21/24 1856 09/21/24 2244 09/22/24 0054 09/22/24 0434  WBC 4.6  --   --  4.6  LATICACIDVEN  --  3.4* 1.6  --    Syncope Potentially orthostatic syncope in the setting of progressive weakness, poor oral intake, evidence of elevated creatinine Obtain orthostatic vital signs Continue gentle hydration with LR at 50 mL/h Continue telemetry monitoring Echocardiogram ordered  HTN PTA meds- Toprol  12.5 mg daily, Lasix  20 mg daily Continue Toprol .  Hold Lasix   Paroxysmal A-fib Continue Toprol  12.5 mg daily Not on anticoagulation  Mild chronic anemia Chronic thrombocytopenia [employer] Hemoglobin remains at baseline more than 10.   Platelet less than 100. No evidence of bleeding. Not on anticoagulation or antiplatelet Continue to monitor Recent Labs  Lab 09/21/24 1856 09/22/24 0434  WBC 4.6 4.6  NEUTROABS  --  3.7  HGB 12.4 10.1*  HCT 37.6 30.6*  MCV 99.5 99.4  PLT 98* 89*    AKI on CKD 2 Baseline creatinine 0.88 from August.  Presented with creatinine of 1.2 Improving with IV fluid  Recent Labs    06/10/24 0531 06/11/24 0749 06/12/24 0653 06/13/24 0722 06/14/24 0620 07/05/24 1820 07/06/24 0510 07/08/24 0323 09/21/24 1856 09/22/24 0434  BUN 23 19 12 10  7* 19 26* 13 19  24*  CREATININE 1.11* 0.96 0.70 0.71 0.84 1.33* 1.11* 0.88 1.20* 1.11*  CO2 20* 21* 21* 22 23 22 22 26  19* 23   Hyperkalemia Hypomagnesemia Initial potassium level was elevated at 5.2.  Was normal on repeat this morning Magnesium  level  low at 1.5.  Replacement ordered Recent Labs  Lab 09/21/24 1856 09/21/24 2241 09/22/24 0434  K 5.2*  --  4.1  MG  --  1.7 1.5*   Type 2 diabetes mellitus Diabetic neuropathy A1c 4.6 Diet controlled Continue SSI/Accu-Cheks Continue gabapentin  for neuropathy  Recent Labs  Lab 09/21/24 1845 09/22/24 0755  GLUCAP 180* 114*   HLD Crestor   Liver cirrhosis, splenomegaly Noted on CT abdomen on admission.  Previous CT abdomen from 2022 that showed fatty infiltration and splenomegaly No evidence of decompensation. To follow-up with GI as an outpatient.  Generalized weakness Impaired mobility PT eval ordered     Mobility:   PT Orders: Active   PT Follow up Rec:     Goals of care   Code Status: Full Code     DVT prophylaxis:  SCDs Start: 09/21/24 2319   Antimicrobials: IV Rocephin  Fluid: None Consultants: None Family Communication: None at bedside  Status: Inpatient Level of care:  Telemetry   Patient is from: Home Needs to continue in-hospital care: On IV antibiotics for cellulitis Anticipated d/c to: Pending PT eval, pending clinical course      Diet:  Diet Order             Diet regular Room service appropriate? Yes; Fluid consistency: Thin  Diet effective now                   Scheduled Meds:  gabapentin   300 mg Oral BID   insulin  aspart  0-5 Units Subcutaneous QHS   insulin  aspart  0-9 Units Subcutaneous TID WC   metoprolol  succinate  12.5 mg Oral Daily   pantoprazole   40 mg Oral Daily   rosuvastatin   5 mg Oral Daily    PRN meds: acetaminophen  **OR** acetaminophen , melatonin, ondansetron  (ZOFRAN ) IV   Infusions:   cefTRIAXone  (ROCEPHIN )  IV     magnesium  sulfate bolus IVPB 2 g (09/22/24 1036)    Antimicrobials: Anti-infectives (From admission, onward)    Start     Dose/Rate Route Frequency Ordered Stop   09/22/24 2200  cefTRIAXone  (ROCEPHIN ) 1 g in sodium chloride  0.9 % 100 mL IVPB        1 g 200 mL/hr over 30 Minutes  Intravenous Every 24 hours 09/21/24 2326     09/21/24 2145  cefTRIAXone  (ROCEPHIN ) 1 g in sodium chloride  0.9 % 100 mL IVPB        1 g 200 mL/hr over 30 Minutes Intravenous  Once 09/21/24 2139 09/21/24 2326       Objective: Vitals:   09/22/24 0828 09/22/24 1041  BP:  (!) 102/46  Pulse:  79  Resp:  (!) 21  Temp: 98.6 F (37 C)   SpO2:  100%   No intake or output data in the 24 hours ending 09/22/24 1057 There were no vitals filed for this visit. Weight change:  Body mass index is 36.61 kg/m.   Physical Exam: General exam: Pleasant, elderly Caucasian female.  Not in physical distress Skin: No rashes, lesions or ulcers. HEENT: Atraumatic, normocephalic, no obvious bleeding Lungs: Clear to auscultation bilaterally,  CVS: S1, S2, no murmur,   GI/Abd: Soft, nontender, nondistended, bowel sound present,   CNS: Alert, awake, oriented x  3 Psychiatry: Mood appropriate Extremities: Left lower extremity normal.  Right lower extremity with acute cellulitic changes up to the knee but not the foot .     Data Review: I have personally reviewed the laboratory data and studies available.  F/u labs ordered Unresulted Labs (From admission, onward)     Start     Ordered   09/23/24 0500  CBC with Differential/Platelet  Tomorrow morning,   R        09/22/24 1057   09/23/24 0500  Basic metabolic panel with GFR  Tomorrow morning,   R        09/22/24 1057   09/22/24 0029  Rapid urine drug screen (hospital performed)  Add-on,   AD        09/22/24 0028   09/21/24 2140  Blood culture (routine x 2)  BLOOD CULTURE X 2,   R      09/21/24 2139            Signed, Chapman Rota, MD [facility] 09/22/2024

## 2024-09-22 NOTE — Progress Notes (Signed)
 Patient was seen for hypotension and sensation of throat swelling.   She is being treated with Rocephin  for RLE cellulitis, developed pruritus and hypotension, then reported lip swelling, and then sensation that her throat was closing while receiving second dose of Rocephin .   She is alert and oriented with no objective swelling, no wheezing, and no stridor.   SBP is 80s with normal HR.   She was given fluid bolus, IM epi, IV Solu-Medrol , and IV Benadryl . Rocephin  was discontinued and added to allergy list. Linezolid  was ordered instead. Plan to transfer to progressive unit.

## 2024-09-22 NOTE — Significant Event (Addendum)
 Rapid Response Event Note   Reason for Call :  Hypotension-70s. Pt c/o dizziness and feeling everything going dark.  Advised to lay pt flat in bed and start NS bolus.  Initial Focused Assessment:  Pt lying in bed with eyes open, in no visible distress. She is alert and oriented, c/o dizziness/weakness. Lungs clear/diminished in the bases. ABD large/soft/nontender. Skin warm to touch/dry.   T-98.7, HR-70, BP-74/39, RR-18, SpO2-92% on RA  Interventions:  1L NS bolus CBG-128 Plan of Care:  Pt BP improving with bolus infusing. Pt says she feels a little better.  Give rest of bolus and monitor response.  Continue to monitor pt closely. Please call RRT if further assistance needed. Event Summary:   MD Notified: Dr. Charlton notified by bedside RN Call Time:2011 Arrival Time:2018 End Time:2050  2155-Update: Pt remains hypotensive after 1L NS bolus. Pt c/o lip swelling and itching. On assessment, pt says this began this afternoon as well as her voice/throat becoming scratchy.  She has no SOB/stridor/hypoxia. Lungs remain CTA.   Interventions:  Additional 1L NS bolus 0.3epi IM(epipen ) 50mg  benadryl  IV 125mg  solu-medrol  IV Zyvox  500mg  q12h  Tx to PCU for closer monitoring.   Plan: Await PCU bed assignment and transfer to PCU. Reassess VS post NS bolus. Pt may benefit from additional IV fluids. Please call RRT if further assistance needed.    Tish Graeme Piety, RN

## 2024-09-22 NOTE — Progress Notes (Signed)
 Pt had a temp of 100.4 650mg  of tylenol  given at 1617pm. PT was working with Patient and rechecked her temp Patient temp now 102.8 MD notified and orders placed for cold compress and additional dose of tylenol . Will continue to monitor

## 2024-09-22 NOTE — ED Notes (Signed)
 Patient placed back on 2L of oxygen via nasal cannula.

## 2024-09-22 NOTE — Evaluation (Signed)
 Physical Therapy Evaluation Patient Details Name: Kathleen Wiley MRN: [id number] DOB: 1945-11-09 Today's Date: 09/22/2024  History of Present Illness  Pt is a 78 y.o. female who presented 09/21/24 with syncope. Pt admitted with R lower extremity cellulitis and sepsis. PMH includes HTN, HLD, PAF, DMII, myelodysplastic syndrome, GERD, heart murmur, endometriosis, sleep apnea  Clinical Impression  Pt presents with condition above and deficits mentioned below, see PT Problem List. PTA, she was mod I using a SPC outside and no AD inside, living with her husband in a 1-level house with 3-4 STE. Currently, she displays deficits in bil feet sensation (baseline neuropathy), balance, functional strength, and endurance. She also displays deficits in R lower leg skin integrity with noted erythema and edema. The pt is currently needing up to minA to transition supine sit but only CGA to return to supine, transfer to stand, and ambulate around the room with a RW. Mobility was limited this date by her BP dropping after ambulating, see below. Pt also was warm to palpation, thus her oral temperature was taken and noted to be 102.8 degrees F. Notified RN and MD of her BP and temperature. The pt will likely progress well and quickly as her R leg pain improves and she begins to feel better, thus do not anticipate a need for post acute PT at d/c. Pt in agreement. Will continue to follow acutely to maximize her return to baseline prior to d/c home.   Orthostatics -  121/45 & 90 bpm supine 123/52 & 89 bpm sitting 114/49 & 81 bpm standing 91/70 & 91 bpm sitting after ambulating      If plan is discharge home, recommend the following: A little help with walking and/or transfers;A little help with bathing/dressing/bathroom;Assistance with cooking/housework;Assist for transportation;Help with stairs or ramp for entrance   Can travel by private vehicle        Equipment Recommendations None recommended by PT   Recommendations for Other Services       Functional Status Assessment Patient has had a recent decline in their functional status and demonstrates the ability to make significant improvements in function in a reasonable and predictable amount of time.     Precautions / Restrictions Precautions Precautions: Fall Recall of Precautions/Restrictions: Intact Precaution/Restrictions Comments: watch BP Restrictions Weight Bearing Restrictions Per Provider Order: No      Mobility  Bed Mobility Overal bed mobility: Needs Assistance Bed Mobility: Supine to Sit, Sit to Supine     Supine to sit: Min assist, HOB elevated Sit to supine: Contact guard assist, HOB elevated   General bed mobility comments: MinA to scoot hips to EOB to sit up L EOB, otherwise pt managed her trunk and legs on her own with CGA. CGA for safety with return to supine    Transfers Overall transfer level: Needs assistance Equipment used: Rolling walker (2 wheels) Transfers: Sit to/from Stand Sit to Stand: Contact guard assist           General transfer comment: Extra time to power up to stand, no LOB, CGA for safety, 1x from EOB and 1x from commode    Ambulation/Gait Ambulation/Gait assistance: Contact guard assist Gait Distance (Feet): 25 Feet (x2 bouts of ~15 ft > ~25 ft) Assistive device: Rolling walker (2 wheels) Gait Pattern/deviations: Step-through pattern, Decreased stride length, Trunk flexed Gait velocity: reduced Gait velocity interpretation: <1.8 ft/sec, indicate of risk for recurrent falls   General Gait Details: Pt with slow, but steady gait with mildly flexed posture. No LOB,  CGA for safety  Stairs            Wheelchair Mobility     Tilt Bed    Modified Rankin (Stroke Patients Only)       Balance Overall balance assessment: Needs assistance Sitting-balance support: No upper extremity supported, Feet supported Sitting balance-Leahy Scale: Good     Standing balance  support: Bilateral upper extremity supported, During functional activity, Reliant on assistive device for balance, No upper extremity supported Standing balance-Leahy Scale: Fair Standing balance comment: able to stand at sink and wash hands without UE support, but reliant on RW to ambulate                             Pertinent Vitals/Pain Pain Assessment Pain Assessment: 0-10 Pain Score: 8  Pain Location: R leg Pain Descriptors / Indicators: Aching, Discomfort, Grimacing, Guarding Pain Intervention(s): Limited activity within patient's tolerance, Monitored during session, Repositioned    Home Living Family/patient expects to be discharged to:: Private residence Living Arrangements: Spouse/significant other Available Help at Discharge: Family;Available PRN/intermittently (husband works, but could arrange 24/7 if really needed) Type of Home: House Home Access: Stairs to enter Entrance Stairs-Rails: Can reach both Entrance Stairs-Number of Steps: 3-4   Home Layout: One level Home Equipment: Agricultural Consultant (2 wheels);Rollator (4 wheels);BSC/3in1;Cane - single point;Grab bars - tub/shower      Prior Function Prior Level of Function : Independent/Modified Independent;Driving             Mobility Comments: cane outside the home, no AD in the home; syncope episode with this admission was only fall in past 6 months ADLs Comments: ind with ADLs, manages own meds, cooks, drives     Extremity/Trunk Assessment   Upper Extremity Assessment Upper Extremity Assessment: Defer to OT evaluation    Lower Extremity Assessment Lower Extremity Assessment: RLE deficits/detail;LLE deficits/detail;Generalized weakness RLE Deficits / Details: erythema and edema at lower leg, admitted with cellulitis; reports hx of peripheral neuropathy bil, impacting sensation in feet, worse on R than L; generalized weakness noted with functional mobility RLE Sensation: history of peripheral  neuropathy LLE Deficits / Details: reports hx of peripheral neuropathy bil, impacting sensation in feet, worse on R than L; generalized weakness noted with functional mobility LLE Sensation: history of peripheral neuropathy       Communication   Communication Communication: No apparent difficulties    Cognition Arousal: Alert Behavior During Therapy: WFL for tasks assessed/performed   PT - Cognitive impairments: No apparent impairments                         Following commands: Intact       Cueing Cueing Techniques: Verbal cues     General Comments General comments (skin integrity, edema, etc.): Orthostatics - 121/45 & 90 bpm supine, 123/52 & 89 bpm sitting, 114/49 & 81 bpm standing, 91/70 & 91 bpm sitting after ambulating; Oral temperature noted to be 102.8 degrees F    Exercises     Assessment/Plan    PT Assessment Patient needs continued PT services  PT Problem List Decreased strength;Decreased activity tolerance;Decreased balance;Decreased mobility;Impaired sensation;Decreased skin integrity;Pain       PT Treatment Interventions DME instruction;Gait training;Stair training;Functional mobility training;Therapeutic activities;Therapeutic exercise;Balance training;Neuromuscular re-education;Patient/family education    PT Goals (Current goals can be found in the Care Plan section)  Acute Rehab PT Goals Patient Stated Goal: to improve PT Goal Formulation: With patient  Time For Goal Achievement: 10/06/24 Potential to Achieve Goals: Good    Frequency Min 1X/week     Co-evaluation               AM-PAC PT 6 Clicks Mobility  Outcome Measure Help needed turning from your back to your side while in a flat bed without using bedrails?: A Little Help needed moving from lying on your back to sitting on the side of a flat bed without using bedrails?: A Little Help needed moving to and from a bed to a chair (including a wheelchair)?: A Little Help needed  standing up from a chair using your arms (e.g., wheelchair or bedside chair)?: A Little Help needed to walk in hospital room?: A Little Help needed climbing 3-5 steps with a railing? : A Little 6 Click Score: 18    End of Session   Activity Tolerance: Patient tolerated treatment well;Other (comment) (limited by BP drop) Patient left: in bed;with call bell/phone within reach;with bed alarm set Nurse Communication: Mobility status;Other (comment) (and MD notified of BP and temperature) PT Visit Diagnosis: Unsteadiness on feet (R26.81);Other abnormalities of gait and mobility (R26.89);Muscle weakness (generalized) (M62.81);Difficulty in walking, not elsewhere classified (R26.2);Pain Pain - Right/Left: Right Pain - part of body: Leg    Time: 1651-1720 PT Time Calculation (min) (ACUTE ONLY): 29 min   Charges:   PT Evaluation $PT Eval Moderate Complexity: 1 Mod PT Treatments $Therapeutic Activity: 8-22 mins PT General Charges $$ ACUTE PT VISIT: 1 Visit         Theo Ferretti, PT, DPT Acute Rehabilitation Services  Office: [phone]   Theo CHRISTELLA Ferretti 09/22/2024, 5:35 PM

## 2024-09-22 NOTE — Progress Notes (Signed)
 PT Cancellation Note  Patient Details Name: Kathleen Wiley MRN: [id number] DOB: [birth year]   Cancelled Treatment:    Reason Eval/Treat Not Completed: (P) Other (comment). Per chart, plan for venous ultrasound to rule out R leg DVT. Confirmed with MD to hold off on PT until this is completed. Will plan to follow-up once cleared and time permits.   Theo Ferretti, PT, DPT Acute Rehabilitation Services  Office: [phone]    Theo CHRISTELLA Ferretti 09/22/2024, 8:09 AM

## 2024-09-22 NOTE — Progress Notes (Signed)
 VASCULAR LAB    Right lower extremity venous duplex has been performed.  See CV proc for preliminary results.   Kenlee Maler, RVT 09/22/2024, 11:06 AM

## 2024-09-23 ENCOUNTER — Inpatient Hospital Stay (HOSPITAL_COMMUNITY)

## 2024-09-23 DIAGNOSIS — L03115 Cellulitis of right lower limb: Secondary | ICD-10-CM | POA: Diagnosis not present

## 2024-09-23 DIAGNOSIS — R55 Syncope and collapse: Secondary | ICD-10-CM | POA: Diagnosis not present

## 2024-09-23 LAB — BASIC METABOLIC PANEL WITH GFR
Anion gap: 9 (ref 5–15)
BUN: 32 mg/dL — ABNORMAL HIGH (ref 8–23)
CO2: 21 mmol/L — ABNORMAL LOW (ref 22–32)
Calcium: 7.2 mg/dL — ABNORMAL LOW (ref 8.9–10.3)
Chloride: 105 mmol/L (ref 98–111)
Creatinine, Ser: 1.41 mg/dL — ABNORMAL HIGH (ref 0.44–1.00)
GFR, Estimated: 38 mL/min — ABNORMAL LOW (ref >60)
Glucose, Bld: 193 mg/dL — ABNORMAL HIGH (ref 70–99)
Potassium: 4.2 mmol/L (ref 3.5–5.1)
Sodium: 135 mmol/L (ref 135–145)

## 2024-09-23 LAB — CBC WITH DIFFERENTIAL/PLATELET
Abs Immature Granulocytes: 0.15 K/uL — ABNORMAL HIGH (ref 0.00–0.07)
Basophils Absolute: 0.1 K/uL (ref 0.0–0.1)
Basophils Relative: 2 %
Eosinophils Absolute: 0 K/uL (ref 0.0–0.5)
Eosinophils Relative: 0 %
HCT: 30.5 % — ABNORMAL LOW (ref 36.0–46.0)
Hemoglobin: 10.2 g/dL — ABNORMAL LOW (ref 12.0–15.0)
Immature Granulocytes: 5 %
Lymphocytes Relative: 8 %
Lymphs Abs: 0.2 K/uL — ABNORMAL LOW (ref 0.7–4.0)
MCH: 32.7 pg (ref 26.0–34.0)
MCHC: 33.4 g/dL (ref 30.0–36.0)
MCV: 97.8 fL (ref 80.0–100.0)
Monocytes Absolute: 0.1 K/uL (ref 0.1–1.0)
Monocytes Relative: 2 %
Neutro Abs: 2.4 K/uL (ref 1.7–7.7)
Neutrophils Relative %: 83 %
Platelets: 78 K/uL — ABNORMAL LOW (ref 150–400)
RBC: 3.12 MIL/uL — ABNORMAL LOW (ref 3.87–5.11)
RDW: 14.9 % (ref 11.5–15.5)
Smear Review: NORMAL
WBC: 2.9 K/uL — ABNORMAL LOW (ref 4.0–10.5)
nRBC: 0 % (ref 0.0–0.2)

## 2024-09-23 LAB — ECHOCARDIOGRAM COMPLETE
Area-P 1/2: 3.2 cm2
Calc EF: 67 %
Height: 65 in
MV VTI: 3.74 cm2
S' Lateral: 3.3 cm
Single Plane A2C EF: 73.3 %
Single Plane A4C EF: 61.7 %
Weight: 3739 [oz_av]

## 2024-09-23 LAB — GLUCOSE, CAPILLARY
Glucose-Capillary: 183 mg/dL — ABNORMAL HIGH (ref 70–99)
Glucose-Capillary: 264 mg/dL — ABNORMAL HIGH (ref 70–99)
Glucose-Capillary: 232 mg/dL — ABNORMAL HIGH (ref 70–99)
Glucose-Capillary: 168 mg/dL — ABNORMAL HIGH (ref 70–99)

## 2024-09-23 MED ORDER — SODIUM CHLORIDE 0.9 % IV SOLN: INTRAVENOUS | Status: AC

## 2024-09-23 MED ORDER — CEFAZOLIN SODIUM-DEXTROSE 1-4 GM/50ML-% IV SOLN
1.0000 g | Freq: Three times a day (TID) | INTRAVENOUS | Status: DC
  Administered 2024-09-23 – 2024-09-25 (×5): 1 g via INTRAVENOUS
  Filled 2024-09-23 (×6): qty 50

## 2024-09-23 MED ORDER — DIPHENHYDRAMINE HCL 25 MG PO CAPS
25.0000 mg | ORAL_CAPSULE | Freq: Once | ORAL | Status: AC | PRN
  Administered 2024-09-23: 25 mg via ORAL
  Filled 2024-09-23: qty 1

## 2024-09-23 MED ORDER — DIPHENHYDRAMINE HCL 50 MG/ML IJ SOLN: 25.0000 mg | Freq: Once | INTRAMUSCULAR | Status: DC | PRN

## 2024-09-23 MED ORDER — EPINEPHRINE 0.3 MG/0.3ML IJ SOAJ
0.3000 mg | Freq: Once | INTRAMUSCULAR | Status: DC | PRN
  Filled 2024-09-23: qty 0.3

## 2024-09-23 NOTE — Evaluation (Signed)
 Occupational Therapy Evaluation Patient Details Name: Kathleen Wiley MRN: [id number] DOB: 1946-07-14 Today's Date: 09/23/2024   History of Present Illness   Pt is a 78 y.o. female who presented 09/21/24 with syncope. Pt admitted with R lower extremity cellulitis and sepsis. PMH includes HTN, HLD, PAF, DMII, myelodysplastic syndrome, GERD, heart murmur, endometriosis, sleep apnea     Clinical Impressions Pt admitted based on above, and was seen based on problem list below. PTA pt was mod I with ADLs and IADLs. Today pt is at baseline for ADLs. Completed LB dressing with min assist (uses sock aid at baseline), toilet transfer and hygiene at supervision level with use of IV pole to steady. Pt negative and asymptomatic for orthostatics, O2 sats stable on RA. Pt reporting no concerns regarding ADLs for d/c, no follow up OT needs. OT is signing off on this pt.     If plan is discharge home, recommend the following:   Assistance with cooking/housework;Assist for transportation     Functional Status Assessment   Patient has not had a recent decline in their functional status     Equipment Recommendations   None recommended by OT      Precautions/Restrictions   Precautions Precautions: Fall Recall of Precautions/Restrictions: Intact Precaution/Restrictions Comments: watch BP Restrictions Weight Bearing Restrictions Per Provider Order: No     Mobility Bed Mobility Overal bed mobility: Needs Assistance Bed Mobility: Supine to Sit, Sit to Supine     Supine to sit: Supervision Sit to supine: Supervision   General bed mobility comments: Heavy reliance on bed features    Transfers Overall transfer level: Needs assistance Equipment used:  (IV pole) Transfers: Sit to/from Stand Sit to Stand: Supervision           General transfer comment: Good hand placement, use of IV pole to simulate cane for in room mobility      Balance Overall balance assessment: Needs  assistance Sitting-balance support: No upper extremity supported, Feet supported Sitting balance-Leahy Scale: Good     Standing balance support: Single extremity supported, During functional activity Standing balance-Leahy Scale: Fair Standing balance comment: IV pole     ADL either performed or assessed with clinical judgement   ADL Overall ADL's : At baseline       General ADL Comments: At baseline uses sock aid for socks, mod I for standing ADLs and transfer     Vision Baseline Vision/History: 0 No visual deficits Patient Visual Report: No change from baseline Vision Assessment?: No apparent visual deficits            Pertinent Vitals/Pain Pain Assessment Pain Assessment: Faces Faces Pain Scale: Hurts little more Pain Location: R leg Pain Descriptors / Indicators: Aching, Discomfort, Grimacing, Guarding Pain Intervention(s): Monitored during session     Extremity/Trunk Assessment Upper Extremity Assessment Upper Extremity Assessment: Overall WFL for tasks assessed   Lower Extremity Assessment Lower Extremity Assessment: Defer to PT evaluation   Cervical / Trunk Assessment Cervical / Trunk Assessment: Kyphotic   Communication Communication Communication: No apparent difficulties   Cognition Arousal: Alert Behavior During Therapy: WFL for tasks assessed/performed Cognition: No apparent impairments       Following commands: Intact       Cueing  General Comments   Cueing Techniques: Verbal cues  Pt negative for orthostatics, see additional note. Pt received on 1L, sats 100%, session conducted on RA, pt sustaining 84% or better           Home Living Family/patient expects to  be discharged to:: Private residence Living Arrangements: Spouse/significant other Available Help at Discharge: Family;Available PRN/intermittently Type of Home: House Home Access: Stairs to enter [employer] of Steps: 3-4 Entrance Stairs-Rails: Can reach  both Home Layout: One level     Bathroom Shower/Tub: Chief Strategy Officer: Standard Bathroom Accessibility: Yes How Accessible: Accessible via walker Home Equipment: Rolling Walker (2 wheels);Rollator (4 wheels);BSC/3in1;Cane - single point;Grab bars - tub/shower;Adaptive equipment Adaptive Equipment: Sock aid        Prior Functioning/Environment Prior Level of Function : Independent/Modified Independent;Driving             Mobility Comments: cane outside the home, no AD in the home; syncope episode with this admission was only fall in past 6 months ADLs Comments: Mod I, use of sock aid    OT Problem List: Impaired balance (sitting and/or standing)        OT Goals(Current goals can be found in the care plan section)   Acute Rehab OT Goals Patient Stated Goal: To get better OT Goal Formulation: All assessment and education complete, DC therapy Time For Goal Achievement: 10/07/24 Potential to Achieve Goals: Good   AM-PAC OT 6 Clicks Daily Activity     Outcome Measure Help from another person eating meals?: None Help from another person taking care of personal grooming?: None Help from another person toileting, which includes using toliet, bedpan, or urinal?: None Help from another person bathing (including washing, rinsing, drying)?: None Help from another person to put on and taking off regular upper body clothing?: None Help from another person to put on and taking off regular lower body clothing?: None 6 Click Score: 24   End of Session Nurse Communication: Mobility status  Activity Tolerance: Patient tolerated treatment well Patient left: in bed;with call bell/phone within reach  OT Visit Diagnosis: Other abnormalities of gait and mobility (R26.89)                Time: 8495-8463 OT Time Calculation (min): 32 min Charges:  OT General Charges $OT Visit: 1 Visit OT Evaluation $OT Eval Moderate Complexity: 1 Mod OT Treatments $Self Care/Home  Management : 8-22 mins  Kathleen Wiley, OT  Acute Rehabilitation Services Office [phone] Secure chat preferred   Kathleen Wiley Savers 09/23/2024, 3:51 PM

## 2024-09-23 NOTE — Progress Notes (Signed)
  Echocardiogram 2D Echocardiogram has been performed.  Norleen ORN [facility] 09/23/2024, 10:17 AM

## 2024-09-23 NOTE — Progress Notes (Signed)
 PROGRESS NOTE  Kathleen Wiley  DOB: 30-Jun-1946  PCP: Dyane Anthony RAMAN, FNP FMW:[id number]  DOA: 09/21/2024  LOS: 2 days  Hospital Day: 3  Subjective: Patient was seen and examined this morning. Lying down in bed..  Not in distress. Feels better than presentation. Last fever was 103.7 at 530 yesterday evening.  No fever overnight but blood pressure overnight was down in 80s. On exam of right leg, I think her cellulitis looks more beefy red today.  She is unable to tell if the pain is any different.  Brief narrative: Kathleen Wiley is a 78 y.o. female with PMH significant for DM2, HTN, HLD, OSA, A-fib not on anticoagulation, chronic thrombocytopenia, refusal to transfusion ([employer]), GERD, arthritis, diabetic neuropathy 11/14, brought to ED by EMS for progressive generalized weakness for the last few days culminating to an episode of syncope.    Reports progressive generalized weakness for the last few days, progressively worsening redness, pain on her right leg.  On the day of presentation, patient was standing up to go to the bathroom, felt dizzy, lightheaded and passed out.  She fell forward, believes she hit her head on the bathroom floor. EMS noted urinary incontinence.  In the ED, patient was initially afebrile but later he spiked a fever of 101, mild tachycardia, blood pressure 122/58, breathing 3 L Initial labs with WBC count 4.6, platelet 98, lactic acid elevated 3.4, CK level normal, troponin normal Respiratory virus panel unremarkable Urinalysis with clear urine, with no bacteria Chest x-ray unremarkable EKG with sinus tachycardia at 104 BPM, QTc 460 ms  CT head did not show any acute intracranial abnormality, showed mild generalized atrophy and chronic ischemic small vessel disease CT angio chest did not show pulm embolism, showed mild cardiomegaly and evidence of pulm artery hypertension CT abdomen pelvis did not show any acute abnormality, raise suspicion of  cirrhosis, splenomegaly and colonic diverticulosis without diverticulitis Blood culture sent Patient was given IV fluid, started on IV Rocephin  Admitted to [facility]  Assessment and plan: Sepsis POA right lower EXTR cellulitis Presented with progressive redness, swelling and pain of right lower extremity Picture attached. In the ED, had fever, tachycardia, lactic acidosis but normal WBC count Blood culture sent, started on IV antibiotics Lactic acid level normalized Ultrasound duplex ruled out DVT but showed enlarged lymph nodes noted in the right groin, secondary to distal cellulitis. Had episodes of fever spike yesterday afternoon.  Tmax 103.7 at 5:30 PM.  No fever after that.  11/16, on exam of right leg, I think her cellulitis looks more beefy red today.  She is unable to tell if the pain is any different. Continue IV Ancef  for now. Continue to monitor.  If continues to worsen, may need surgical evaluation to rule out necrotizing fasciitis.  Clinically does not have a toxic look overall. Recent Labs  Lab 09/21/24 1856 09/21/24 2244 09/22/24 0054 09/22/24 0434 09/23/24 0512  WBC 4.6  --   --  4.6 2.9*  LATICACIDVEN  --  3.4* 1.6  --   --    Syncope POA Potentially orthostatic syncope in the setting of progressive weakness, poor oral intake, evidence of elevated creatinine 11/15 orthostatic vital signs measured.  Blood pressure dropped from 121/45 lying down to 91/70 on standing at 3 minutes. Started on IV hydration Continue telemetry monitoring Echocardiogram pending  Hypotension Overnight blood pressure remains low in 80s but without tachycardia. PTA meds- Toprol  12.5 mg daily, Lasix  20 mg daily Currently continued on Toprol .  I would stop it given low blood pressure.  Lasix  remains on hold Continue IV hydration.  Currently on NS at 125 mL/h, Repeat renal function and lactic acid tomorrow.  Paroxysmal A-fib Toprol  on hold as above Not on anticoagulation  Mild chronic  anemia Chronic thrombocytopenia Je[employer] Witness Hemoglobin remains at baseline more than 10.   Platelet less than 100. No evidence of bleeding. Not on anticoagulation or antiplatelet Continue to monitor Recent Labs  Lab 09/21/24 1856 09/22/24 0434 09/23/24 0512  WBC 4.6 4.6 2.9*  NEUTROABS  --  3.7 2.4  HGB 12.4 10.1* 10.2*  HCT 37.6 30.6* 30.5*  MCV 99.5 99.4 97.8  PLT 98* 89* 78*   AKI on CKD 2 Baseline creatinine 0.88 from August. Presented with creatinine of 1.2.  Creatinine was elevated this morning likely due to prolonged hypotension. Continue IV fluid.  Repeat labs tomorrow.  Recent Labs    06/11/24 0749 06/12/24 0653 06/13/24 0722 06/14/24 0620 07/05/24 1820 07/06/24 0510 07/08/24 0323 09/21/24 1856 09/22/24 0434 09/23/24 0512  BUN 19 12 10  7* 19 26* 13 19 24* 32*  CREATININE 0.96 0.70 0.71 0.84 1.33* 1.11* 0.88 1.20* 1.11* 1.41*  CO2 21* 21* 22 23 22 22 26  19* 23 21*   Hyperkalemia Hypomagnesemia Initial potassium level was elevated at 5.2.  Was normal on repeat this morning Magnesium  level low at 1.5.  Replacement ordered Recent Labs  Lab 09/21/24 1856 09/21/24 2241 09/22/24 0434 09/23/24 0512  K 5.2*  --  4.1 4.2  MG  --  1.7 1.5*  --    Type 2 diabetes mellitus Diabetic neuropathy A1c 4.6 Diet controlled Continue SSI/Accu-Cheks Continue gabapentin  for neuropathy  Recent Labs  Lab 09/22/24 0755 09/22/24 1145 09/22/24 1615 09/22/24 2022 09/23/24 0806  GLUCAP 114* 111* 148* 128* 183*   HLD Crestor   Liver cirrhosis, splenomegaly Noted on CT abdomen on admission.  Previous CT abdomen from 2022 that showed fatty infiltration and splenomegaly No evidence of decompensation. To follow-up with GI as an outpatient.  Generalized weakness Impaired mobility PT eval ordered     Mobility:   PT Orders: Active   PT Follow up Rec: No Pt Follow Up11/15/2025 1600    Goals of care   Code Status: Full Code     DVT prophylaxis:   SCDs Start: 09/21/24 2319   Antimicrobials: IV Ancef  Fluid: None Consultants: None Family Communication: None at bedside  Status: Inpatient Level of care:  Progressive   Patient is from: Home Needs to continue in-hospital care: On IV antibiotics for cellulitis Anticipated d/c to: Pending PT eval, pending clinical course.   Diet:  Diet Order             Diet regular Room service appropriate? Yes; Fluid consistency: Thin  Diet effective now                   Scheduled Meds:  gabapentin   300 mg Oral BID   insulin  aspart  0-5 Units Subcutaneous QHS   insulin  aspart  0-9 Units Subcutaneous TID WC   pantoprazole   40 mg Oral Daily   rosuvastatin   5 mg Oral Daily    PRN meds: acetaminophen  **OR** acetaminophen , camphor-menthol , melatonin, menthol , ondansetron  (ZOFRAN ) IV, phenol   Infusions:    ceFAZolin  (ANCEF ) IV      Antimicrobials: Anti-infectives (From admission, onward)    Start     Dose/Rate Route Frequency Ordered Stop   09/23/24 2100  ceFAZolin  (ANCEF ) IVPB 1 g/50 mL premix  1 g 100 mL/hr over 30 Minutes Intravenous Every 8 hours 09/23/24 1142     09/22/24 2300  linezolid  (ZYVOX ) IVPB 600 mg  Status:  Discontinued        600 mg 300 mL/hr over 60 Minutes Intravenous Every 12 hours 09/22/24 2154 09/23/24 1142   09/22/24 2200  cefTRIAXone  (ROCEPHIN ) 1 g in sodium chloride  0.9 % 100 mL IVPB  Status:  Discontinued        1 g 200 mL/hr over 30 Minutes Intravenous Every 24 hours 09/21/24 2326 09/22/24 2154   09/21/24 2145  cefTRIAXone  (ROCEPHIN ) 1 g in sodium chloride  0.9 % 100 mL IVPB        1 g 200 mL/hr over 30 Minutes Intravenous  Once 09/21/24 2139 09/21/24 2326       Objective: Vitals:   09/23/24 0402 09/23/24 0807  BP: (!) 104/55 (!) 105/56  Pulse: 71   Resp: 18 18  Temp: 97.8 F (36.6 C) 98.1 F (36.7 C)  SpO2: 96%     Intake/Output Summary (Last 24 hours) at 09/23/2024 1145 Last data filed at 09/23/2024 0300 Gross per 24 hour   Intake 1686.76 ml  Output --  Net 1686.76 ml   Filed Weights   09/23/24 0009 09/23/24 0402  Weight: 106 kg 106 kg   Weight change:  Body mass index is 38.89 kg/m.   Physical Exam: General exam: Pleasant, elderly Caucasian female.  Not in physical distress Skin: No rashes, lesions or ulcers. HEENT: Atraumatic, normocephalic, no obvious bleeding Lungs: Clear to auscultation bilaterally,  CVS: S1, S2, no murmur,   GI/Abd: Soft, nontender, nondistended, bowel sound present,   CNS: Alert, awake, oriented x 3 Psychiatry: Mood appropriate Extremities: Left lower extremity normal.  Right lower extremity with acute cellulitic changes up to the knee but not the foot .  Does not look clinically improving today.   Data Review: I have personally reviewed the laboratory data and studies available.  F/u labs ordered Unresulted Labs (From admission, onward)     Start     Ordered   09/24/24 0500  CBC with Differential/Platelet  Tomorrow morning,   R        09/23/24 0802   09/24/24 0500  Comprehensive metabolic panel with GFR  Tomorrow morning,   R        09/23/24 0802   09/24/24 0500  Lactic acid, plasma  (Lactic Acid)  Tomorrow morning,   R        09/23/24 0802   09/22/24 0029  Rapid urine drug screen (hospital performed)  Add-on,   AD        09/22/24 0028            Signed, Chapman Rota, MD [facility] 09/23/2024

## 2024-09-23 NOTE — Plan of Care (Signed)
  Problem: Education: Goal: Ability to describe self-care measures that may prevent or decrease complications (Diabetes Survival Skills Education) will improve Outcome: Progressing Goal: Individualized Educational Video(s) Outcome: Progressing   Problem: Coping: Goal: Ability to adjust to condition or change in health will improve Outcome: Progressing   Problem: Fluid Volume: Goal: Ability to maintain a balanced intake and output will improve Outcome: Progressing   Problem: Health Behavior/Discharge Planning: Goal: Ability to identify and utilize available resources and services will improve Outcome: Progressing Goal: Ability to manage health-related needs will improve Outcome: Progressing   Problem: Metabolic: Goal: Ability to maintain appropriate glucose levels will improve Outcome: Progressing   Problem: Nutritional: Goal: Maintenance of adequate nutrition will improve Outcome: Progressing Goal: Progress toward achieving an optimal weight will improve Outcome: Progressing   Problem: Skin Integrity: Goal: Risk for impaired skin integrity will decrease Outcome: Progressing   Problem: Tissue Perfusion: Goal: Adequacy of tissue perfusion will improve Outcome: Progressing   Problem: Clinical Measurements: Goal: Ability to avoid or minimize complications of infection will improve Outcome: Progressing   Problem: Skin Integrity: Goal: Skin integrity will improve Outcome: Progressing   Problem: Education: Goal: Knowledge of General Education information will improve Description: Including pain rating scale, medication(s)/side effects and non-pharmacologic comfort measures Outcome: Progressing   Problem: Health Behavior/Discharge Planning: Goal: Ability to manage health-related needs will improve Outcome: Progressing   Problem: Clinical Measurements: Goal: Ability to maintain clinical measurements within normal limits will improve Outcome: Progressing Goal: Will  remain free from infection Outcome: Progressing Goal: Diagnostic test results will improve Outcome: Progressing Goal: Respiratory complications will improve Outcome: Progressing Goal: Cardiovascular complication will be avoided Outcome: Progressing   Problem: Activity: Goal: Risk for activity intolerance will decrease Outcome: Progressing   Problem: Nutrition: Goal: Adequate nutrition will be maintained Outcome: Progressing   Problem: Coping: Goal: Level of anxiety will decrease Outcome: Progressing   Problem: Elimination: Goal: Will not experience complications related to bowel motility Outcome: Progressing Goal: Will not experience complications related to urinary retention Outcome: Progressing   Problem: Pain Managment: Goal: General experience of comfort will improve and/or be controlled Outcome: Progressing   Problem: Safety: Goal: Ability to remain free from injury will improve Outcome: Progressing   Problem: Skin Integrity: Goal: Risk for impaired skin integrity will decrease Outcome: Progressing

## 2024-09-23 NOTE — Progress Notes (Signed)
   09/23/24 1541  Orthostatic Lying   BP- Lying 122/56  Orthostatic Sitting  BP- Sitting 119/66  Orthostatic Standing at 0 minutes  BP- Standing at 0 minutes 140/56  Orthostatic Standing at 3 minutes  BP- Standing at 3 minutes 142/58   Orthostatics recorded during OT eval. Pt negative and asymptomatic. See full OT note for further details.    Sala Tague C, OT  Acute Rehabilitation Services Office [phone] Secure chat preferred

## 2024-09-24 DIAGNOSIS — L03115 Cellulitis of right lower limb: Secondary | ICD-10-CM | POA: Diagnosis not present

## 2024-09-24 LAB — CBC WITH DIFFERENTIAL/PLATELET
Abs Immature Granulocytes: 0.11 K/uL — ABNORMAL HIGH (ref 0.00–0.07)
Basophils Absolute: 0 K/uL (ref 0.0–0.1)
Basophils Relative: 2 %
Eosinophils Absolute: 0.1 K/uL (ref 0.0–0.5)
Eosinophils Relative: 2 %
HCT: 27.4 % — ABNORMAL LOW (ref 36.0–46.0)
Hemoglobin: 8.9 g/dL — ABNORMAL LOW (ref 12.0–15.0)
Immature Granulocytes: 4 %
Lymphocytes Relative: 18 %
Lymphs Abs: 0.5 K/uL — ABNORMAL LOW (ref 0.7–4.0)
MCH: 32.5 pg (ref 26.0–34.0)
MCHC: 32.5 g/dL (ref 30.0–36.0)
MCV: 100 fL (ref 80.0–100.0)
Monocytes Absolute: 0.2 K/uL (ref 0.1–1.0)
Monocytes Relative: 7 %
Neutro Abs: 1.9 K/uL (ref 1.7–7.7)
Neutrophils Relative %: 67 %
Platelets: 75 K/uL — ABNORMAL LOW (ref 150–400)
RBC: 2.74 MIL/uL — ABNORMAL LOW (ref 3.87–5.11)
RDW: 14.6 % (ref 11.5–15.5)
WBC: 2.7 K/uL — ABNORMAL LOW (ref 4.0–10.5)
nRBC: 0 % (ref 0.0–0.2)

## 2024-09-24 LAB — COMPREHENSIVE METABOLIC PANEL WITH GFR
ALT: 16 U/L (ref 0–44)
AST: 16 U/L (ref 15–41)
Albumin: 2.3 g/dL — ABNORMAL LOW (ref 3.5–5.0)
Alkaline Phosphatase: 41 U/L (ref 38–126)
Anion gap: 10 (ref 5–15)
BUN: 28 mg/dL — ABNORMAL HIGH (ref 8–23)
CO2: 19 mmol/L — ABNORMAL LOW (ref 22–32)
Calcium: 7.7 mg/dL — ABNORMAL LOW (ref 8.9–10.3)
Chloride: 109 mmol/L (ref 98–111)
Creatinine, Ser: 0.99 mg/dL (ref 0.44–1.00)
GFR, Estimated: 59 mL/min — ABNORMAL LOW (ref >60)
Glucose, Bld: 120 mg/dL — ABNORMAL HIGH (ref 70–99)
Potassium: 4.2 mmol/L (ref 3.5–5.1)
Sodium: 138 mmol/L (ref 135–145)
Total Bilirubin: 0.9 mg/dL (ref 0.0–1.2)
Total Protein: 5.2 g/dL — ABNORMAL LOW (ref 6.5–8.1)

## 2024-09-24 LAB — GLUCOSE, CAPILLARY
Glucose-Capillary: 110 mg/dL — ABNORMAL HIGH (ref 70–99)
Glucose-Capillary: 129 mg/dL — ABNORMAL HIGH (ref 70–99)
Glucose-Capillary: 129 mg/dL — ABNORMAL HIGH (ref 70–99)
Glucose-Capillary: 167 mg/dL — ABNORMAL HIGH (ref 70–99)

## 2024-09-24 LAB — LACTIC ACID, PLASMA: Lactic Acid, Venous: 0.8 mmol/L (ref 0.5–1.9)

## 2024-09-24 NOTE — Plan of Care (Signed)
  Problem: Fluid Volume: Goal: Ability to maintain a balanced intake and output will improve Outcome: Progressing   Problem: Metabolic: Goal: Ability to maintain appropriate glucose levels will improve Outcome: Progressing   Problem: Tissue Perfusion: Goal: Adequacy of tissue perfusion will improve Outcome: Progressing   Problem: Skin Integrity: Goal: Skin integrity will improve Outcome: Progressing

## 2024-09-24 NOTE — Progress Notes (Signed)
 PROGRESS NOTE  Kathleen Wiley  DOB: [birth year]  PCP: Dyane Anthony RAMAN, FNP FMW:[id number]  DOA: 09/21/2024  LOS: 3 days  Hospital Day: 4  Subjective: Patient was seen and examined this morning. Propped up in bed.  Not in distress.  Feels better. Cellulitis seems to be shrinking. Afebrile, hemodynamically stable with blood pressure consistently over 100 in the last 24 hours, breathing on room air Labs this morning with WBC count 2.7, hemoglobin 8.9, platelets 75, renal function normal  Brief narrative: Kathleen Wiley is a 78 y.o. female with PMH significant for DM2, HTN, HLD, OSA, A-fib not on anticoagulation, chronic thrombocytopenia, refusal to transfusion (Jehovah's Witness), GERD, arthritis, diabetic neuropathy 11/14, brought to ED by EMS for progressive generalized weakness for the last few days culminating to an episode of syncope.    Reports progressive generalized weakness for the last few days, progressively worsening redness, pain on her right leg.  On the day of presentation, patient was standing up to go to the bathroom, felt dizzy, lightheaded and passed out.  She fell forward, believes she hit her head on the bathroom floor. EMS noted urinary incontinence.  In the ED, patient was initially afebrile but later he spiked a fever of 101, mild tachycardia, blood pressure 122/58, breathing 3 L Initial labs with WBC count 4.6, platelet 98, lactic acid elevated 3.4, CK level normal, troponin normal Respiratory virus panel unremarkable Urinalysis with clear urine, with no bacteria Chest x-ray unremarkable EKG with sinus tachycardia at 104 BPM, QTc 460 ms  CT head did not show any acute intracranial abnormality, showed mild generalized atrophy and chronic ischemic small vessel disease CT angio chest did not show pulm embolism, showed mild cardiomegaly and evidence of pulm artery hypertension CT abdomen pelvis did not show any acute abnormality, raise suspicion of cirrhosis,  splenomegaly and colonic diverticulosis without diverticulitis Blood culture sent Patient was given IV fluid, started on IV Rocephin  Admitted to [facility]  Assessment and plan: Sepsis POA right lower EXTR cellulitis Presented with progressive redness, swelling and pain of right lower extremity Picture attached. In the ED, had fever, tachycardia, lactic acidosis but normal WBC count Blood culture sent, started on IV antibiotics Lactic acid level normalized Ultrasound duplex ruled out DVT but showed enlarged lymph nodes noted in the right groin, secondary to distal cellulitis. Had fever spike up to 103.7 on admission but remains afebrile for last 48 hours. WBC count low and lactic acid has normalized Currently on IV Ancef  for now. Cellulitis shrinking.  Pain improving. Continue to monitor.  Recent Labs  Lab 09/21/24 1856 09/21/24 2244 09/22/24 0054 09/22/24 0434 09/23/24 0512 09/24/24 0717  WBC 4.6  --   --  4.6 2.9* 2.7*  LATICACIDVEN  --  3.4* 1.6  --   --  0.8   Syncope POA Orthostatic hypotension Hypotension H/o hypertension Potentially orthostatic syncope in the setting of progressive weakness, poor oral intake, evidence of elevated creatinine 11/15 orthostatic vital signs measured.  Blood pressure dropped from 121/45 lying down to 91/70 on standing at 3 minutes.  Was hypotensive overnight 11/15 to 11/16. Given adequate IV hydration.  In the last 24 hours, blood pressure has improved and has consistently remained more than 100. PTA meds- Toprol  12.5 mg daily, Lasix  20 mg daily Currently both on hold. Continue telemetry monitoring Echocardiogram with EF 60 to 65%, no WMA,,mild LVH  Paroxysmal A-fib Toprol  on hold as above Not on anticoagulation  Acute on chronic anemia Chronic thrombocytopenia Jehovah's Witness Hemoglobin at  baseline more than 10.  Noted to drop to 8.9 today likely because of dilution.  No active bleeding. Platelet continues to remain less than 100. Not  on anticoagulation or antiplatelet Continue to monitor Recent Labs  Lab 09/21/24 1856 09/22/24 0434 09/23/24 0512 09/24/24 0717  WBC 4.6 4.6 2.9* 2.7*  NEUTROABS  --  3.7 2.4 1.9  HGB 12.4 10.1* 10.2* 8.9*  HCT 37.6 30.6* 30.5* 27.4*  MCV 99.5 99.4 97.8 100.0  PLT 98* 89* 78* 75*   AKI on CKD 2 Baseline creatinine 0.88 from August. Presented with creatinine of 1.2.  Creatinine was elevated to peak at 1.41 yesterday due to prolonged hypotension. Continue IV fluid.  Repeat labs tomorrow.  Recent Labs    06/12/24 0653 06/13/24 0722 06/14/24 0620 07/05/24 1820 07/06/24 0510 07/08/24 0323 09/21/24 1856 09/22/24 0434 09/23/24 0512 09/24/24 0717  BUN 12 10 7* 19 26* 13 19 24* 32* 28*  CREATININE 0.70 0.71 0.84 1.33* 1.11* 0.88 1.20* 1.11* 1.41* 0.99  CO2 21* 22 23 22 22 26  19* 23 21* 19*   Hyperkalemia Hypomagnesemia Potassium level improved with replacement. Magnesium  level was replaced Recent Labs  Lab 09/21/24 1856 09/21/24 2241 09/22/24 0434 09/23/24 0512 09/24/24 0717  K 5.2*  --  4.1 4.2 4.2  MG  --  1.7 1.5*  --   --    Type 2 diabetes mellitus Diabetic neuropathy A1c 4.6 Diet controlled Continue SSI/Accu-Cheks Continue gabapentin  for neuropathy  Recent Labs  Lab 09/23/24 1152 09/23/24 1544 09/23/24 2135 09/24/24 0836 09/24/24 1220  GLUCAP 264* 232* 168* 110* 129*   HLD Crestor   Liver cirrhosis, splenomegaly Noted on CT abdomen on admission.  Previous CT abdomen from 2022 that showed fatty infiltration and splenomegaly No evidence of decompensation. To follow-up with GI as an outpatient.  Generalized weakness Impaired mobility PT eval obtained.  No follow-up recommended    PT Follow up Rec: No Pt Follow Up11/15/2025 1600    Goals of care   Code Status: Full Code     DVT prophylaxis:  SCDs Start: 09/21/24 2319   Antimicrobials: IV Ancef  Fluid: None Consultants: None Family Communication: None at bedside  Status:  Inpatient Level of care:  Progressive   Patient is from: Home Needs to continue in-hospital care: On IV antibiotics for cellulitis Anticipated d/c to:  pending clinical course.   Diet:  Diet Order             Diet regular Room service appropriate? Yes; Fluid consistency: Thin  Diet effective now                   Scheduled Meds:  gabapentin   300 mg Oral BID   insulin  aspart  0-5 Units Subcutaneous QHS   insulin  aspart  0-9 Units Subcutaneous TID WC   pantoprazole   40 mg Oral Daily   rosuvastatin   5 mg Oral Daily    PRN meds: acetaminophen  **OR** acetaminophen , camphor-menthol , diphenhydrAMINE , EPINEPHrine , melatonin, menthol , ondansetron  (ZOFRAN ) IV, phenol   Infusions:    ceFAZolin  (ANCEF ) IV 100 mL/hr at 09/24/24 0603    Antimicrobials: Anti-infectives (From admission, onward)    Start     Dose/Rate Route Frequency Ordered Stop   09/23/24 2100  ceFAZolin  (ANCEF ) IVPB 1 g/50 mL premix        1 g 100 mL/hr over 30 Minutes Intravenous Every 8 hours 09/23/24 1142     09/22/24 2300  linezolid  (ZYVOX ) IVPB 600 mg  Status:  Discontinued  600 mg 300 mL/hr over 60 Minutes Intravenous Every 12 hours 09/22/24 2154 09/23/24 1142   09/22/24 2200  cefTRIAXone  (ROCEPHIN ) 1 g in sodium chloride  0.9 % 100 mL IVPB  Status:  Discontinued        1 g 200 mL/hr over 30 Minutes Intravenous Every 24 hours 09/21/24 2326 09/22/24 2154   09/21/24 2145  cefTRIAXone  (ROCEPHIN ) 1 g in sodium chloride  0.9 % 100 mL IVPB        1 g 200 mL/hr over 30 Minutes Intravenous  Once 09/21/24 2139 09/21/24 2326       Objective: Vitals:   09/24/24 0836 09/24/24 1221  BP: 124/64 118/65  Pulse: 73 70  Resp: 20 20  Temp: 98.4 F (36.9 C) 97.8 F (36.6 C)  SpO2: 93% 92%    Intake/Output Summary (Last 24 hours) at 09/24/2024 1531 Last data filed at 09/24/2024 0857 Gross per 24 hour  Intake 1550.31 ml  Output 475 ml  Net 1075.31 ml   Filed Weights   09/23/24 0009 09/23/24 0402  09/24/24 0400  Weight: 106 kg 106 kg 107.4 kg   Weight change: 1.366 kg Body mass index is 39.39 kg/m.   Physical Exam: General exam: Pleasant, elderly Caucasian female.  Not in physical distress Skin: No rashes, lesions or ulcers. HEENT: Atraumatic, normocephalic, no obvious bleeding Lungs: Clear to auscultation bilaterally,  CVS: S1, S2, no murmur,   GI/Abd: Soft, nontender, nondistended, bowel sound present,   CNS: Alert, awake, oriented x 3 Psychiatry: Mood appropriate Extremities: Left lower extremity normal.  Right lower extremity cellulitis improving.   Data Review: I have personally reviewed the laboratory data and studies available.  F/u labs ordered Unresulted Labs (From admission, onward)     Start     Ordered   09/25/24 0500  Basic metabolic panel with GFR  Tomorrow morning,   R       Question:  Specimen collection method  Answer:  Lab=Lab collect   09/24/24 1531   09/25/24 0500  CBC with Differential/Platelet  Tomorrow morning,   R       Question:  Specimen collection method  Answer:  Lab=Lab collect   09/24/24 1531   09/22/24 0029  Rapid urine drug screen (hospital performed)  Add-on,   AD        09/22/24 0028            Signed, Chapman Rota, MD [facility] 09/24/2024

## 2024-09-24 NOTE — TOC Initial Note (Signed)
 Transition of Care [facility]) - Initial/Assessment Note    Patient Details  Name: Kathleen Wiley MRN: [id number] Date of Birth: 02/15/1946  Transition of Care [facility] CM/SW Contact:    Lauraine FORBES Saa, LCSWA Phone Number: 09/24/2024, 1:45 PM  Clinical Narrative:                  1:45 PM Per chart review, patient resides at home with spouse. Patient has a PCP and insurance. Patient has SNF history with [facility] and [facility]. Patient has HH history with [facility]. Patient has DME (BSC, RW, oxygen) history with [facility]. Patient's preferred pharmacy's are [facility], [facility] [place], and [facility] [phone]. PT/OT did not have disposition recommendations for patient. No TOC needs identified at this time. TOC will continue to follow.  Expected Discharge Plan: Home/Self Care Barriers to Discharge: Continued Medical Work up   Patient Goals and CMS Choice            Expected Discharge Plan and Services       Living arrangements for the past 2 months: Single Family Home                                      Prior Living Arrangements/Services Living arrangements for the past 2 months: Single Family Home Lives with:: Spouse Patient language and need for interpreter reviewed:: Yes        Need for Family Participation in Patient Care: No (Comment)   Current home services: DME Criminal Activity/Legal Involvement Pertinent to Current Situation/Hospitalization: No - Comment as needed  Activities of Daily Living   ADL Screening (condition at time of admission) Independently performs ADLs?: Yes (appropriate for developmental age) Is the patient deaf or have difficulty hearing?: Yes Does the patient have difficulty seeing, even when wearing glasses/contacts?: No Does the patient have difficulty concentrating, remembering, or making decisions?: No  Permission Sought/Granted Permission sought to share information with :  Family Supports Permission granted to share information with : No (Contact information on chart)  Share Information with NAME: Kathleen Wiley     Permission granted to share info w Relationship: Spouse  Permission granted to share info w Contact Information: [phone]  Emotional Assessment       Orientation: : Oriented to Self, Oriented to Place, Oriented to  Time, Oriented to Situation Alcohol / Substance Use: Not Applicable Psych Involvement: No (comment)  Admission diagnosis:  Syncope and collapse [R55] Lactic acidosis [E87.20] Cellulitis of right lower extremity [L03.115] Patient Active Problem List   Diagnosis Date Noted   Generalized weakness 09/22/2024   Syncope and collapse 09/22/2024   Lactic acidosis 09/22/2024   DM2 (diabetes mellitus, type 2) (HCC) 09/22/2024   Paroxysmal atrial fibrillation (HCC) 09/22/2024   Drug rash 07/22/2024   Thrombocytopenia 07/07/2024   MDS (myelodysplastic syndrome) (HCC) 07/07/2024   Cellulitis of lower extremity 07/05/2024   Recurrent cellulitis 06/14/2024   Cellulitis of leg 06/10/2024   Severe sepsis (HCC) 06/10/2024   AKI (acute kidney injury) 06/10/2024   Nausea and vomiting 06/10/2024   MDS (myelodysplastic syndrome), low grade (HCC) 07/13/2022   Prolonged QT interval 05/04/2022   Elevated TSH 05/04/2022   Abnormal ECG 05/04/2022   Cellulitis 05/03/2022   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) 05/03/2022   Sinus bradycardia 05/03/2022   Group G streptococcal infection 04/21/2022   Sepsis (HCC) 04/20/2022  Cellulitis of right lower extremity 04/20/2022   Gallstone pancreatitis 03/15/2021   Acute cholecystitis 03/15/2021   Controlled type 2 diabetes mellitus with neuropathy (HCC) 03/15/2021   Obesity with body mass index (BMI) of 30.0 to 39.9 03/15/2021   Acute gallstone pancreatitis 03/15/2021   Abnormal cervical Papanicolaou smear 02/18/2021   Abnormal liver function tests 02/18/2021   Acquired hallux  valgus 02/18/2021   Acute hypoxemic respiratory failure (HCC) 02/18/2021   Aneurysm of thoracic aorta 02/18/2021   Anxiety 02/18/2021   Aortic root dilatation 02/18/2021   Aortic valve disorder 02/18/2021   Arthropathy 02/18/2021   Callosity 02/18/2021   Cardiomegaly 02/18/2021   Chest discomfort 02/18/2021   Chronic kidney disease (CKD) stage G2/A1, mildly decreased glomerular filtration rate (GFR) between 60-89 mL/min/1.73 square meter and albuminuria creatinine ratio less than 30 mg/g 02/18/2021   Chronic kidney disease, stage 2 (mild) 02/18/2021   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   COVID-19 virus infection 02/18/2021   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/18/2021   Edema 02/18/2021   Edema of both lower legs 02/18/2021   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   Leukopenia 02/18/2021   Low back pain 02/18/2021   Malabsorption syndrome 02/18/2021   Metabolic syndrome 02/18/2021   Mild cervical dysplasia 02/18/2021   Mixed anxiety and depressive disorder 02/18/2021   Neuropathy 02/18/2021   Obstructive sleep apnea syndrome 02/18/2021   Senile purpura 02/18/2021   Snoring 02/18/2021   Elevated transaminase level 02/18/2021   Type 2 diabetes mellitus with hyperglycemia (HCC) 02/18/2021   Urge incontinence of urine 02/18/2021   Vitamin B12 deficiency (non anemic) 02/18/2021   Vitamin D deficiency 02/18/2021   Pneumonia due to COVID-19 virus 10/25/2019   Hypokalemia 10/25/2019   Hyperbilirubinemia 10/25/2019   Pre-diabetes    History of total hip arthroplasty, right 09/18/2018   Osteoarthritis of right hip 09/14/2018   Morbid obesity, BMI not known (HCC) 09/17/2016   Stasis ulcer (HCC) 09/17/2016   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 [place] [address] Phone: [phone] Fax: [phone]  [facility] [place], [place] - [address] [place] [address] Phone: [phone] Fax: [phone]  [facility] [address] [place] [address] Phone: [phone] Fax: [phone]     Social Drivers of Health (SDOH) Social History: SDOH Screenings   Food Insecurity: No Food Insecurity (09/22/2024)  Housing: Low Risk  (09/22/2024)  Transportation Needs: No Transportation Needs (09/22/2024)  Utilities: Not At Risk (09/22/2024)  Social Connections: Moderately Integrated (09/22/2024)  Tobacco Use: Medium Risk (09/21/2024)   SDOH Interventions:     Readmission Risk Interventions     No data to display

## 2024-09-24 NOTE — Plan of Care (Signed)
  Problem: Education: Goal: Ability to describe self-care measures that may prevent or decrease complications (Diabetes Survival Skills Education) will improve Outcome: Progressing Goal: Individualized Educational Video(s) Outcome: Progressing   Problem: Coping: Goal: Ability to adjust to condition or change in health will improve Outcome: Progressing   Problem: Fluid Volume: Goal: Ability to maintain a balanced intake and output will improve Outcome: Progressing   Problem: Health Behavior/Discharge Planning: Goal: Ability to identify and utilize available resources and services will improve Outcome: Progressing Goal: Ability to manage health-related needs will improve Outcome: Progressing   Problem: Metabolic: Goal: Ability to maintain appropriate glucose levels will improve Outcome: Progressing   Problem: Nutritional: Goal: Maintenance of adequate nutrition will improve Outcome: Progressing Goal: Progress toward achieving an optimal weight will improve Outcome: Progressing   Problem: Skin Integrity: Goal: Risk for impaired skin integrity will decrease Outcome: Progressing   Problem: Tissue Perfusion: Goal: Adequacy of tissue perfusion will improve Outcome: Progressing   Problem: Clinical Measurements: Goal: Ability to avoid or minimize complications of infection will improve Outcome: Progressing   Problem: Skin Integrity: Goal: Skin integrity will improve Outcome: Progressing   Problem: Education: Goal: Knowledge of General Education information will improve Description: Including pain rating scale, medication(s)/side effects and non-pharmacologic comfort measures Outcome: Progressing   Problem: Health Behavior/Discharge Planning: Goal: Ability to manage health-related needs will improve Outcome: Progressing   Problem: Clinical Measurements: Goal: Ability to maintain clinical measurements within normal limits will improve Outcome: Progressing Goal: Will  remain free from infection Outcome: Progressing Goal: Diagnostic test results will improve Outcome: Progressing Goal: Respiratory complications will improve Outcome: Progressing Goal: Cardiovascular complication will be avoided Outcome: Progressing   Problem: Activity: Goal: Risk for activity intolerance will decrease Outcome: Progressing   Problem: Nutrition: Goal: Adequate nutrition will be maintained Outcome: Progressing   Problem: Coping: Goal: Level of anxiety will decrease Outcome: Progressing   Problem: Elimination: Goal: Will not experience complications related to bowel motility Outcome: Progressing Goal: Will not experience complications related to urinary retention Outcome: Progressing   Problem: Pain Managment: Goal: General experience of comfort will improve and/or be controlled Outcome: Progressing   Problem: Safety: Goal: Ability to remain free from injury will improve Outcome: Progressing   Problem: Skin Integrity: Goal: Risk for impaired skin integrity will decrease Outcome: Progressing

## 2024-09-25 ENCOUNTER — Other Ambulatory Visit (HOSPITAL_COMMUNITY): Payer: Self-pay

## 2024-09-25 DIAGNOSIS — L03115 Cellulitis of right lower limb: Secondary | ICD-10-CM | POA: Diagnosis not present

## 2024-09-25 LAB — CBC WITH DIFFERENTIAL/PLATELET
Abs Immature Granulocytes: 0.11 K/uL — ABNORMAL HIGH (ref 0.00–0.07)
Basophils Absolute: 0.1 K/uL (ref 0.0–0.1)
Basophils Relative: 2 %
Eosinophils Absolute: 0.2 K/uL (ref 0.0–0.5)
Eosinophils Relative: 8 %
HCT: 30 % — ABNORMAL LOW (ref 36.0–46.0)
Hemoglobin: 9.6 g/dL — ABNORMAL LOW (ref 12.0–15.0)
Immature Granulocytes: 5 %
Lymphocytes Relative: 33 %
Lymphs Abs: 0.7 K/uL (ref 0.7–4.0)
MCH: 32.2 pg (ref 26.0–34.0)
MCHC: 32 g/dL (ref 30.0–36.0)
MCV: 100.7 fL — ABNORMAL HIGH (ref 80.0–100.0)
Monocytes Absolute: 0.2 K/uL (ref 0.1–1.0)
Monocytes Relative: 8 %
Neutro Abs: 1 K/uL — ABNORMAL LOW (ref 1.7–7.7)
Neutrophils Relative %: 44 %
Platelets: 114 K/uL — ABNORMAL LOW (ref 150–400)
RBC: 2.98 MIL/uL — ABNORMAL LOW (ref 3.87–5.11)
RDW: 14.6 % (ref 11.5–15.5)
Smear Review: NORMAL
WBC: 2.2 K/uL — ABNORMAL LOW (ref 4.0–10.5)
nRBC: 0 % (ref 0.0–0.2)

## 2024-09-25 LAB — GLUCOSE, CAPILLARY
Glucose-Capillary: 102 mg/dL — ABNORMAL HIGH (ref 70–99)
Glucose-Capillary: 143 mg/dL — ABNORMAL HIGH (ref 70–99)
Glucose-Capillary: 90 mg/dL (ref 70–99)
Glucose-Capillary: 185 mg/dL — ABNORMAL HIGH (ref 70–99)

## 2024-09-25 LAB — BASIC METABOLIC PANEL WITH GFR
Anion gap: 15 (ref 5–15)
BUN: 20 mg/dL (ref 8–23)
CO2: 21 mmol/L — ABNORMAL LOW (ref 22–32)
Calcium: 8.4 mg/dL — ABNORMAL LOW (ref 8.9–10.3)
Chloride: 107 mmol/L (ref 98–111)
Creatinine, Ser: 0.88 mg/dL (ref 0.44–1.00)
GFR, Estimated: >60 mL/min (ref >60)
Glucose, Bld: 107 mg/dL — ABNORMAL HIGH (ref 70–99)
Potassium: 4.3 mmol/L (ref 3.5–5.1)
Sodium: 143 mmol/L (ref 135–145)

## 2024-09-25 MED ORDER — FLORANEX PO PACK
1.0000 g | PACK | Freq: Three times a day (TID) | ORAL | 0 refills | Status: AC
  Filled 2024-09-25: qty 21, 7d supply, fill #0

## 2024-09-25 MED ORDER — ACETAMINOPHEN 325 MG PO TABS: 650.0000 mg | ORAL_TABLET | Freq: Four times a day (QID) | ORAL | Status: AC | PRN

## 2024-09-25 MED ORDER — CEFADROXIL 500 MG PO CAPS
500.0000 mg | ORAL_CAPSULE | Freq: Two times a day (BID) | ORAL | Status: DC
  Administered 2024-09-25 – 2024-09-26 (×2): 500 mg via ORAL
  Filled 2024-09-25 (×2): qty 1

## 2024-09-25 MED ORDER — METOPROLOL SUCCINATE ER 25 MG PO TB24
12.5000 mg | ORAL_TABLET | Freq: Every day | ORAL | Status: DC
  Administered 2024-09-25 – 2024-09-26 (×2): 12.5 mg via ORAL
  Filled 2024-09-25 (×2): qty 1

## 2024-09-25 MED ORDER — CEFADROXIL 500 MG PO CAPS
1000.0000 mg | ORAL_CAPSULE | Freq: Two times a day (BID) | ORAL | 0 refills | Status: AC
  Filled 2024-09-25: qty 28, 7d supply, fill #0

## 2024-09-25 NOTE — Plan of Care (Signed)
  Problem: Education: Goal: Ability to describe self-care measures that may prevent or decrease complications (Diabetes Survival Skills Education) will improve Outcome: Progressing Goal: Individualized Educational Video(s) Outcome: Progressing   Problem: Coping: Goal: Ability to adjust to condition or change in health will improve Outcome: Progressing   Problem: Fluid Volume: Goal: Ability to maintain a balanced intake and output will improve Outcome: Progressing   Problem: Health Behavior/Discharge Planning: Goal: Ability to identify and utilize available resources and services will improve Outcome: Progressing Goal: Ability to manage health-related needs will improve Outcome: Progressing   Problem: Metabolic: Goal: Ability to maintain appropriate glucose levels will improve Outcome: Progressing   Problem: Nutritional: Goal: Maintenance of adequate nutrition will improve Outcome: Progressing Goal: Progress toward achieving an optimal weight will improve Outcome: Progressing   Problem: Skin Integrity: Goal: Risk for impaired skin integrity will decrease Outcome: Progressing   Problem: Tissue Perfusion: Goal: Adequacy of tissue perfusion will improve Outcome: Progressing   Problem: Clinical Measurements: Goal: Ability to avoid or minimize complications of infection will improve Outcome: Progressing   Problem: Skin Integrity: Goal: Skin integrity will improve Outcome: Progressing   Problem: Education: Goal: Knowledge of General Education information will improve Description: Including pain rating scale, medication(s)/side effects and non-pharmacologic comfort measures Outcome: Progressing   Problem: Health Behavior/Discharge Planning: Goal: Ability to manage health-related needs will improve Outcome: Progressing   Problem: Clinical Measurements: Goal: Ability to maintain clinical measurements within normal limits will improve Outcome: Progressing Goal: Will  remain free from infection Outcome: Progressing Goal: Diagnostic test results will improve Outcome: Progressing Goal: Respiratory complications will improve Outcome: Progressing Goal: Cardiovascular complication will be avoided Outcome: Progressing   Problem: Activity: Goal: Risk for activity intolerance will decrease Outcome: Progressing   Problem: Nutrition: Goal: Adequate nutrition will be maintained Outcome: Progressing   Problem: Coping: Goal: Level of anxiety will decrease Outcome: Progressing   Problem: Elimination: Goal: Will not experience complications related to bowel motility Outcome: Progressing Goal: Will not experience complications related to urinary retention Outcome: Progressing   Problem: Pain Managment: Goal: General experience of comfort will improve and/or be controlled Outcome: Progressing   Problem: Safety: Goal: Ability to remain free from injury will improve Outcome: Progressing   Problem: Skin Integrity: Goal: Risk for impaired skin integrity will decrease Outcome: Progressing

## 2024-09-25 NOTE — Progress Notes (Signed)
 Physical Therapy Treatment Patient Details Name: Kathleen Wiley MRN: [id number] DOB: 15-Feb-1946 Today's Date: 09/25/2024   History of Present Illness Pt is a 78 y.o. female who presented 09/21/24 with syncope. Pt admitted with R lower extremity cellulitis and sepsis. PMH includes HTN, HLD, PAF, DMII, myelodysplastic syndrome, GERD, heart murmur, endometriosis, sleep apnea    PT Comments  The pt was agreeable to session with focus on progressing mobility. She was able to make great progress with total ambulation distance, but still benefits from BUE support, especially with addition of balance challenge such as head turns. VSS with no reports of dizziness, pain, or other sx after ambulation, will continue to follow and progress as able. Pt educated on progressive walking program at home, would benefit from home BP cuff to further monitor mobility progression and BP after return home. Pt expressed understanding.     If plan is discharge home, recommend the following: A little help with walking and/or transfers;A little help with bathing/dressing/bathroom;Assistance with cooking/housework;Assist for transportation;Help with stairs or ramp for entrance   Can travel by private vehicle        Equipment Recommendations  None recommended by PT    Recommendations for Other Services       Precautions / Restrictions Precautions Precautions: Fall Recall of Precautions/Restrictions: Intact Precaution/Restrictions Comments: watch BP Restrictions Weight Bearing Restrictions Per Provider Order: No     Mobility  Bed Mobility Overal bed mobility: Needs Assistance Bed Mobility: Supine to Sit, Sit to Supine     Supine to sit: HOB elevated, Supervision Sit to supine: Contact guard assist, HOB elevated   General bed mobility comments: MinA to scoot hips to EOB to sit up L EOB, otherwise pt managed her trunk and legs on her own with CGA. CGA for safety with return to supine    Transfers Overall  transfer level: Needs assistance Equipment used: Rolling walker (2 wheels) Transfers: Sit to/from Stand Sit to Stand: Supervision           General transfer comment: stable with pt reaching for UE support after completing stand.    Ambulation/Gait Ambulation/Gait assistance: Contact guard assist Gait Distance (Feet): 250 Feet Assistive device: Rolling walker (2 wheels) Gait Pattern/deviations: Step-through pattern, Decreased stride length, Trunk flexed Gait velocity: 0.18m/s Gait velocity interpretation: <1.31 ft/sec, indicative of household ambulator   General Gait Details: pt with slowed speed but reports at baseline, no instability with RW until balance directly challenged. attempted 20 ft with hallway rail to mimic use of cane   Stairs             Wheelchair Mobility     Tilt Bed    Modified Rankin (Stroke Patients Only)       Balance Overall balance assessment: Needs assistance Sitting-balance support: No upper extremity supported, Feet supported Sitting balance-Leahy Scale: Good     Standing balance support: Bilateral upper extremity supported, During functional activity, Reliant on assistive device for balance, No upper extremity supported Standing balance-Leahy Scale: Fair Standing balance comment: able to stand without UE support and ambulate with single UE support, prefers BUE support for gait                            Communication Communication Communication: No apparent difficulties  Cognition Arousal: Alert Behavior During Therapy: WFL for tasks assessed/performed   PT - Cognitive impairments: No apparent impairments  PT - Cognition Comments: WFL, good ability to answer questions and follow commands Following commands: Intact      Cueing Cueing Techniques: Verbal cues  Exercises      General Comments General comments (skin integrity, edema, etc.): VSS on RA      Pertinent Vitals/Pain Pain  Assessment Pain Assessment: No/denies pain Pain Intervention(s): Monitored during session     PT Goals (current goals can now be found in the care plan section) Acute Rehab PT Goals Patient Stated Goal: to improve PT Goal Formulation: With patient Time For Goal Achievement: 10/06/24 Potential to Achieve Goals: Good Progress towards PT goals: Progressing toward goals    Frequency    Min 1X/week       AM-PAC PT 6 Clicks Mobility   Outcome Measure  Help needed turning from your back to your side while in a flat bed without using bedrails?: A Little Help needed moving from lying on your back to sitting on the side of a flat bed without using bedrails?: A Little Help needed moving to and from a bed to a chair (including a wheelchair)?: A Little Help needed standing up from a chair using your arms (e.g., wheelchair or bedside chair)?: A Little Help needed to walk in hospital room?: A Little Help needed climbing 3-5 steps with a railing? : A Little 6 Click Score: 18    End of Session Equipment Utilized During Treatment: Gait belt Activity Tolerance: Patient tolerated treatment well Patient left: in bed;with call bell/phone within reach;with bed alarm set Nurse Communication: Mobility status PT Visit Diagnosis: Unsteadiness on feet (R26.81);Other abnormalities of gait and mobility (R26.89);Muscle weakness (generalized) (M62.81);Difficulty in walking, not elsewhere classified (R26.2);Pain Pain - Right/Left: Right Pain - part of body: Leg     Time: 8878-8864 PT Time Calculation (min) (ACUTE ONLY): 14 min  Charges:    $Therapeutic Exercise: 8-22 mins PT General Charges $$ ACUTE PT VISIT: 1 Visit                     Izetta Call, PT, DPT   Acute Rehabilitation Department Office [phone] Secure Chat Communication Preferred   Izetta JULIANNA Call 09/25/2024, 1:07 PM

## 2024-09-25 NOTE — Discharge Summary (Incomplete)
 Physician Discharge Summary  Kathleen Wiley FMW:[id number] DOB: [birth year] DOA: 09/21/2024  PCP: Dyane Anthony RAMAN, FNP  Admit date: 09/21/2024 Discharge date: 09/26/2024  Admitted from: Home Discharge disposition: Home  Recommendations at discharge:  Complete course of antibiotics with 7 more days of cefadroxil  and probiotics Follow-up with ID as an outpatient as before   Subjective: Patient was seen and examined this morning. Was prepared for discharge yesterday but patient felt short of breath, weak and apprehensive about going home especially because of a history of readmission in August which she believes was from premature discharge. This morning, sitting up in recliner.  Feels much better.  Cellulitis improving.  No shortness of breath. Feels confident enough to go home today  Afebrile, hemodynamically stable, breathing on room air  Brief narrative: Kathleen Wiley is a 78 y.o. female with PMH significant for DM2, HTN, HLD, OSA, A-fib not on anticoagulation, chronic thrombocytopenia, refusal to transfusion ([employer]), GERD, arthritis, diabetic neuropathy 11/14, brought to ED by EMS for progressive generalized weakness for the last few days culminating to an episode of syncope.    Reported progressive generalized weakness for the last few days, progressively worsening redness, pain on her right leg.  On the day of presentation, patient was standing up to go to the bathroom, felt dizzy, lightheaded and passed out.  She fell forward, believes she hit her head on the bathroom floor. EMS noted urinary incontinence.  In the ED, patient was initially afebrile but later he spiked a fever of 101, mild tachycardia, blood pressure 122/58, breathing 3 L Initial labs with WBC count 4.6, platelet 98, lactic acid elevated 3.4, CK level normal, troponin normal Respiratory virus panel unremarkable Urinalysis with clear urine, with no bacteria Chest x-ray unremarkable EKG with  sinus tachycardia at 104 BPM, QTc 460 ms  CT head did not show any acute intracranial abnormality, showed mild generalized atrophy and chronic ischemic small vessel disease CT angio chest did not show pulm embolism, showed mild cardiomegaly and evidence of pulm artery hypertension CT abdomen pelvis did not show any acute abnormality, raise suspicion of cirrhosis, splenomegaly and colonic diverticulosis without diverticulitis Blood culture sent Patient was given IV fluid, started on IV Rocephin  Admitted to [facility]  Hospital course: Sepsis POA right lower EXTR cellulitis Presented with progressive redness, swelling and pain of right lower extremity Picture attached. In the ED, had fever, tachycardia, lactic acidosis but normal WBC count Blood culture sent, started on IV antibiotics Lactic acid level normalized Ultrasound duplex ruled out DVT but showed enlarged lymph nodes noted in the right groin, secondary to distal cellulitis. Had fever spike up to 103.7 on admission but remains afebrile for last 48 hours. WBC count low and lactic acid has normalized Currently on IV Ancef  for now. Cellulitis shrinking.  Pain improving. Switch to oral cefadroxil  and continue for next 7 days with probiotics. Continue to monitor.  Recent Labs  Lab 09/21/24 1856 09/21/24 2244 09/22/24 0054 09/22/24 0434 09/23/24 0512 09/24/24 0717 09/25/24 0534  WBC 4.6  --   --  4.6 2.9* 2.7* 2.2*  LATICACIDVEN  --  3.4* 1.6  --   --  0.8  --    Syncope POA Orthostatic hypotension Hypotension H/o hypertension Potentially orthostatic syncope in the setting of progressive weakness, poor oral intake, evidence of elevated creatinine 11/15 orthostatic vital signs measured.  Blood pressure dropped from 121/45 lying down to 91/70 on standing at 3 minutes.  Was hypotensive overnight 11/15 to 11/16. Given adequate  IV hydration.  In the last 24 hours, blood pressure has improved and has consistently remained more than  100. PTA meds- Toprol  12.5 mg daily, Lasix  20 mg daily Currently both on hold.  Heart rate and blood pressure stable.   Can resume both as before Echocardiogram with EF 60 to 65%, no WMA,,mild LVH  Paroxysmal A-fib Toprol  plan as above Not on anticoagulation  Acute on chronic anemia Chronic thrombocytopenia [employer] Hemoglobin at baseline more than 10. No active bleeding.  Remains close to baseline. Platelet continues to remain less than 100. Not on anticoagulation or antiplatelet Continue to monitor Recent Labs  Lab 09/21/24 1856 09/22/24 0434 09/23/24 0512 09/24/24 0717 09/25/24 0534  WBC 4.6 4.6 2.9* 2.7* 2.2*  NEUTROABS  --  3.7 2.4 1.9 1.0*  HGB 12.4 10.1* 10.2* 8.9* 9.6*  HCT 37.6 30.6* 30.5* 27.4* 30.0*  MCV 99.5 99.4 97.8 100.0 100.7*  PLT 98* 89* 78* 75* 114*   AKI on CKD 2 Baseline creatinine 0.88 from August. Presented with creatinine of 1.2.  Creatinine was elevated to peak at 1.41 on 11/16 due to prolonged hypotension.  Renal function remained stable..  Recent Labs    06/13/24 9277 06/14/24 9379 07/05/24 1820 07/06/24 0510 07/08/24 0323 09/21/24 1856 09/22/24 0434 09/23/24 0512 09/24/24 0717 09/25/24 0534  BUN 10 7* 19 26* 13 19 24* 32* 28* 20  CREATININE 0.71 0.84 1.33* 1.11* 0.88 1.20* 1.11* 1.41* 0.99 0.88  CO2 22 23 22 22 26  19* 23 21* 19* 21*   Hyperkalemia Hypomagnesemia Potassium and magnesium  levels were low and were replaced. Recent Labs  Lab 09/21/24 1856 09/21/24 2241 09/22/24 0434 09/23/24 0512 09/24/24 0717 09/25/24 0534  K 5.2*  --  4.1 4.2 4.2 4.3  MG  --  1.7 1.5*  --   --   --    Type 2 diabetes mellitus Diabetic neuropathy A1c 4.6 Diet controlled Continue gabapentin  for neuropathy  Recent Labs  Lab 09/25/24 0759 09/25/24 1158 09/25/24 1656 09/25/24 2128 09/26/24 0813  GLUCAP 102* 143* 90 185* 156*   HLD Crestor   Liver cirrhosis, splenomegaly Noted on CT abdomen on admission.  Previous CT abdomen  from 2022 that showed fatty infiltration and splenomegaly No evidence of decompensation. To follow-up with GI as an outpatient.  Generalized weakness Impaired mobility PT eval obtained.    PT Follow up Rec: No Pt Follow Up11/18/2025 1200    Goals of care   Code Status: Full Code   Diet:  Diet Order             Diet general           Diet regular Room service appropriate? Yes; Fluid consistency: Thin  Diet effective now                   Nutritional status:  Body mass index is 38.77 kg/m.       Wounds:  -    Discharge Medications:   Allergies as of 09/26/2024       Reactions   Other Other (See Comments)   Refuses whole blood but does accept albumin    Rocephin  [ceftriaxone ] Anaphylaxis, Swelling   09/22/24: Patient reported lip swelling, sensation of throat closing, and pruritus while receiving second dose of Rocephin . She was also hypotensive but that appears to have started prior to Rocephin    Ditropan  [oxybutynin ] Other (See Comments)   Unknown reaction   Flagyl [metronidazole] Other (See Comments)   Chest pain Induced A-fib   Lipitor [  atorvastatin] Other (See Comments)   Unknown reaction   Firvanq  [vancomycin ] Itching   Maxipime  [cefepime ] Rash   Has tolerated cefazolin , cefadroxil    Septra [sulfamethoxazole-trimethoprim] Rash   Blistering rash        Medication List     TAKE these medications    acetaminophen  325 MG tablet Commonly known as: TYLENOL  Take 2 tablets (650 mg total) by mouth every 6 (six) hours as needed for mild pain (pain score 1-3) (or Fever >/= 101).   cefadroxil  500 MG capsule Commonly known as: DURICEF Take 2 capsules (1,000 mg total) by mouth 2 (two) times daily for 7 days.   escitalopram  20 MG tablet Commonly known as: LEXAPRO  Take 20 mg by mouth at bedtime.   furosemide  20 MG tablet Commonly known as: LASIX  Take 20 mg by mouth daily.   gabapentin  300 MG capsule Commonly known as: NEURONTIN  Take 300 mg by  mouth in the morning and at bedtime.   ibuprofen  200 MG tablet Commonly known as: ADVIL  Take 400-600 mg by mouth See admin instructions. Take 3 tablets (600mg ) by mouth every morning and take 2 tablets (400mg ) at bedtime.   lactobacillus Pack Take 1 packet (1 g total) by mouth 3 (three) times daily with meals for 7 days.   metoprolol  succinate 25 MG 24 hr tablet Commonly known as: Toprol  XL Take 0.5 tablets (12.5 mg total) by mouth daily. What changed:  how much to take when to take this   nystatin cream Commonly known as: MYCOSTATIN Apply 1 Application topically 2 (two) times daily as needed (skin irritation).   omeprazole 20 MG capsule Commonly known as: PRILOSEC Take 20 mg by mouth daily.   potassium chloride  10 MEQ tablet Commonly known as: KLOR-CON  Take 10 mEq by mouth daily.   rosuvastatin  5 MG tablet Commonly known as: CRESTOR  Take 5 mg by mouth at bedtime.   VITAMIN B-12 PO Take 1 tablet by mouth daily.   VITAMIN D-3 PO Take 1 capsule by mouth daily.         Follow ups:    Follow-up Information     Dyane Anthony RAMAN, FNP Follow up.   Specialty: Family Medicine Contact information: [address] [place] [address] [phone]                 Discharge Instructions:   Discharge Instructions     Call MD for:  difficulty breathing, headache or visual disturbances   Complete by: As directed    Call MD for:  extreme fatigue   Complete by: As directed    Call MD for:  hives   Complete by: As directed    Call MD for:  persistant dizziness or light-headedness   Complete by: As directed    Call MD for:  persistant nausea and vomiting   Complete by: As directed    Call MD for:  severe uncontrolled pain   Complete by: As directed    Call MD for:  temperature >100.4   Complete by: As directed    Diet general   Complete by: As directed    Discharge instructions   Complete by: As directed    Recommendations at  discharge:   Complete course of antibiotics with 7 more days of cefadroxil  and probiotics  Follow-up with ID as an outpatient as before  General discharge instructions: Follow with Primary MD Dyane Anthony RAMAN, FNP in 7 days  Please request your PCP  to go over your hospital  tests, procedures, radiology results at the follow up. Please get your medicines reviewed and adjusted.  Your PCP may decide to repeat certain labs or tests as needed. Do not drive, operate heavy machinery, perform activities at heights, swimming or participation in water activities or provide baby sitting services if your were admitted for syncope or siezures until you have seen by Primary MD or a Neurologist and advised to do so again. [place]  Controlled Substance Reporting System database was reviewed. Do not drive, operate heavy machinery, perform activities at heights, swim, participate in water activities or provide baby-sitting services while on medications for pain, sleep and mood until your outpatient physician has reevaluated you and advised to do so again.  You are strongly recommended to comply with the dose, frequency and duration of prescribed medications. Activity: As tolerated with Full fall precautions use walker/cane & assistance as needed Avoid using any recreational substances like cigarette, tobacco, alcohol, or non-prescribed drug. If you experience worsening of your admission symptoms, develop shortness of breath, life threatening emergency, suicidal or homicidal thoughts you must seek medical attention immediately by calling 911 or calling your MD immediately  if symptoms less severe. You must read complete instructions/literature along with all the possible adverse reactions/side effects for all the medicines you take and that have been prescribed to you. Take any new medicine only after you have completely understood and accepted all the possible adverse reactions/side effects.  Wear Seat belts while  driving. You were cared for by a hospitalist during your hospital stay. If you have any questions about your discharge medications or the care you received while you were in the hospital after you are discharged, you can call the unit and ask to speak with the hospitalist or the covering physician. Once you are discharged, your primary care physician will handle any further medical issues. Please note that NO REFILLS for any discharge medications will be authorized once you are discharged, as it is imperative that you return to your primary care physician (or establish a relationship with a primary care physician if you do not have one).   Increase activity slowly   Complete by: As directed        Discharge Exam:   Vitals:   09/25/24 1940 09/25/24 2334 09/26/24 0510 09/26/24 0810  BP: (!) 132/59 129/61 (!) 156/67 (!) 144/82  Pulse: 77 78 71 77  Resp: 20 19 18 18   Temp: 98.3 F (36.8 C) 98.6 F (37 C) 98.3 F (36.8 C) 98.4 F (36.9 C)  TempSrc: Oral Oral Oral Oral  SpO2:    96%  Weight:   105.7 kg   Height:        Body mass index is 38.77 kg/m.   General exam: Pleasant, elderly Caucasian female.  Not in physical distress Skin: No rashes, lesions or ulcers. HEENT: Atraumatic, normocephalic, no obvious bleeding Lungs: Clear to auscultation bilaterally,  CVS: S1, S2, no murmur,   GI/Abd: Soft, nontender, nondistended, bowel sound present,   CNS: Alert, awake, oriented x 3 Psychiatry: Mood appropriate Extremities: Left lower extremity normal.  Right lower extremity cellulitis improving in terms of swelling, redness, warmth and pain   The results of significant diagnostics from this hospitalization (including imaging, microbiology, ancillary and laboratory) are listed below for reference.    Procedures and Diagnostic Studies:   VAS US  LOWER EXTREMITY VENOUS (DVT) (ONLY [facility] & [facility]) Result Date: 09/23/2024  Lower Venous DVT Study Patient Name:  Kathleen Wiley  Date of Exam:  09/22/2024 Medical Rec #: [id number]        Accession #:    [id number] Date of Birth: Feb 24, 1946        Patient Gender: F Patient Age:   73 years Exam Location:  [facility] Procedure:      VAS US  LOWER EXTREMITY VENOUS (DVT) Referring Phys: LAVONIA PAT --------------------------------------------------------------------------------  Indications: Pain, Swelling, Erythema, and Recurrent cellulitis. Other Indications: Chronic venous stasis. Limitations: Poor ultrasound/tissue interface and Edema and pain with compression maneuvers. Comparison Study: Prior negative right LEV done 06/10/24 and 04/20/22. Performing Technologist: Alberta Lis RVS  Examination Guidelines: A complete evaluation includes B-mode imaging, spectral Doppler, color Doppler, and power Doppler as needed of all accessible portions of each vessel. Bilateral testing is considered an integral part of a complete examination. Limited examinations for reoccurring indications may be performed as noted. The reflux portion of the exam is performed with the patient in reverse Trendelenburg.  +---------+---------------+---------+-----------+----------+-------------------+ RIGHT    CompressibilityPhasicitySpontaneityPropertiesThrombus Aging      +---------+---------------+---------+-----------+----------+-------------------+ CFV      Full           Yes      Yes                                      +---------+---------------+---------+-----------+----------+-------------------+ SFJ      Full                                                             +---------+---------------+---------+-----------+----------+-------------------+ FV Prox  Full           Yes      Yes                                      +---------+---------------+---------+-----------+----------+-------------------+ FV Mid   Full           Yes      Yes                                       +---------+---------------+---------+-----------+----------+-------------------+ FV Distal               Yes      Yes                  patent by color and                                                       Doppler             +---------+---------------+---------+-----------+----------+-------------------+ PFV      Full                                                             +---------+---------------+---------+-----------+----------+-------------------+  POP      Full           Yes      Yes                                      +---------+---------------+---------+-----------+----------+-------------------+ PTV      Full                                                             +---------+---------------+---------+-----------+----------+-------------------+ PERO     Full                                                             +---------+---------------+---------+-----------+----------+-------------------+ Gastroc  Full           Yes      Yes                                      +---------+---------------+---------+-----------+----------+-------------------+   +----+---------------+---------+-----------+----------+--------------+ LEFTCompressibilityPhasicitySpontaneityPropertiesThrombus Aging +----+---------------+---------+-----------+----------+--------------+ CFV Full           Yes      Yes                                 +----+---------------+---------+-----------+----------+--------------+ SFJ Full                                                        +----+---------------+---------+-----------+----------+--------------+     Summary: RIGHT: - There is no evidence of deep vein thrombosis in the lower extremity.  - No cystic structure found in the popliteal fossa. - Ultrasound characteristics of enlarged lymph nodes are noted in the groin.  LEFT: - No evidence of common femoral vein obstruction.   *See table(s) above for measurements  and observations. Electronically signed by Penne Colorado MD on 09/23/2024 at 10:43:54 AM.    Final    CT Angio Chest PE W and/or Wo Contrast Result Date: 09/21/2024 EXAM: CTA CHEST 09/21/2024 10:19:17 PM TECHNIQUE: CTA of the chest was performed after the administration of intravenous contrast. Multiplanar reformatted images are provided for review. MIP images are provided for review. Automated exposure control, iterative reconstruction, and/or weight based adjustment of the mA/kV was utilized to reduce the radiation dose to as low as reasonably achievable. COMPARISON: None available. CLINICAL HISTORY: Pulmonary embolism (PE) suspected, high prob. FINDINGS: PULMONARY ARTERIES: Pulmonary arteries are adequately opacified for evaluation. No acute pulmonary embolus. Enlargement of the main pulmonary artery, suggesting pulmonary arterial hypertension. MEDIASTINUM: Cardiomegaly. Moderate 3 vessel coronary atherosclerosis. Thoracic atherosclerosis. There is no acute abnormality of the thoracic aorta. LYMPH NODES: No mediastinal, hilar or axillary lymphadenopathy. LUNGS AND PLEURA: Mild dependent atelectasis in the right upper and bilateral lower lobes. No focal consolidation or pulmonary edema. No  evidence of pleural effusion or pneumothorax. UPPER ABDOMEN: Limited images of the upper abdomen are unremarkable. SOFT TISSUES AND BONES: Mild degenerative changes of the mid/lower thoracic spine. No acute soft tissue abnormality. IMPRESSION: 1. No pulmonary embolism. 2. Cardiomegaly. 3. Suspected pulmonary arterial hypertension. Electronically signed by: Pinkie Pebbles MD 09/21/2024 10:26 PM EST RP Workstation: [id number]   CT ABDOMEN PELVIS W CONTRAST Result Date: 09/21/2024 EXAM: CT ABDOMEN AND PELVIS WITH CONTRAST 09/21/2024 10:19:17 PM TECHNIQUE: CT of the abdomen and pelvis was performed with the administration of 75 mL of iohexol  (OMNIPAQUE ) 350 MG/ML injection. Multiplanar reformatted images are provided for  review. Automated exposure control, iterative reconstruction, and/or weight-based adjustment of the mA/kV was utilized to reduce the radiation dose to as low as reasonably achievable. COMPARISON: 04/08/2021 CLINICAL HISTORY: n/v. Vomiting. eval for ileus/obstruction FINDINGS: LOWER CHEST: No acute abnormality. LIVER: Mildly nodular contour. Correlate for cirrhosis. GALLBLADDER AND BILE DUCTS: Status post cholecystectomy. No biliary ductal dilatation. SPLEEN: Splenomegaly. PANCREAS: No acute abnormality. ADRENAL GLANDS: No acute abnormality. KIDNEYS, URETERS AND BLADDER: No stones in the kidneys or ureters. No hydronephrosis. No perinephric or periureteral stranding. Urinary bladder is unremarkable. GI AND BOWEL: Stomach demonstrates no acute abnormality. Scattered colonic diverticulosis, without evidence of diverticulitis. There is no bowel obstruction. PERITONEUM AND RETROPERITONEUM: No ascites. No free air. VASCULATURE: Atherosclerotic calcifications of the abdominal aorta and branch vessels, although patent. LYMPH NODES: No lymphadenopathy. REPRODUCTIVE ORGANS: Calcified uterine fibroids. BONES AND SOFT TISSUES: Mild changes of the lumbar spine. Bilateral hip arthroplasties. No focal soft tissue abnormality. IMPRESSION: 1. No acute findings. 2. Suspected cirrhosis. Splenomegaly. 3. Colonic diverticulosis, without evidence of diverticulitis. Electronically signed by: Pinkie Pebbles MD 09/21/2024 10:24 PM EST RP Workstation: [id number]   CT Head Wo Contrast Result Date: 09/21/2024 CLINICAL DATA:  Initial evaluation for acute head trauma, minor. EXAM: CT HEAD WITHOUT CONTRAST TECHNIQUE: Contiguous axial images were obtained from the base of the skull through the vertex without intravenous contrast. RADIATION DOSE REDUCTION: This exam was performed according to the departmental dose-optimization program which includes automated exposure control, adjustment of the mA and/or kV according to patient size and/or  use of iterative reconstruction technique. COMPARISON:  Prior CT from 06/10/2024. FINDINGS: Brain: Mild age-related cerebral atrophy with chronic small vessel ischemic disease. No acute intracranial hemorrhage. No acute large vessel territory infarct. No mass lesion or midline shift. No hydrocephalus or extra-axial fluid collection. Vascular: No abnormal hyperdense vessel. Calcified atherosclerosis present at skull base. Skull: Scalp soft tissues within normal limits.  Calvarium intact. Sinuses/Orbits: Globes orbital soft tissues within normal limits. Changes of chronic right maxillary sinus partially visualized. Paranasal sinuses are otherwise largely clear. No significant mastoid effusion. Other: None. IMPRESSION: 1. No acute intracranial abnormality. 2. Mild age-related cerebral atrophy with chronic small vessel ischemic disease. Electronically Signed   By: Morene Hoard M.D.   On: 09/21/2024 19:45   DG Chest Portable 1 View Result Date: 09/21/2024 EXAM: 1 VIEW(S) XRAY OF THE CHEST 09/21/2024 07:30:00 PM COMPARISON: 07/05/2024 CLINICAL HISTORY: loc FINDINGS: LUNGS AND PLEURA: No focal pulmonary opacity. No pleural effusion. No pneumothorax. HEART AND MEDIASTINUM: Stable mild cardiomegaly. BONES AND SOFT TISSUES: No acute osseous abnormality. IMPRESSION: 1. No acute cardiopulmonary abnormality. Electronically signed by: Pinkie Pebbles MD 09/21/2024 07:39 PM EST RP Workstation: [id number]     Labs:   Basic Metabolic Panel: Recent Labs  Lab 09/21/24 1856 09/21/24 2241 09/22/24 0434 09/23/24 0512 09/24/24 0717 09/25/24 0534  NA 136  --  136 135 138 143  K 5.2*  --  4.1 4.2 4.2 4.3  CL 102  --  103 105 109 107  CO2 19*  --  23 21* 19* 21*  GLUCOSE 177*  --  134* 193* 120* 107*  BUN 19  --  24* 32* 28* 20  CREATININE 1.20*  --  1.11* 1.41* 0.99 0.88  CALCIUM  8.7*  --  7.8* 7.2* 7.7* 8.4*  MG  --  1.7 1.5*  --   --   --    GFR Estimated Creatinine Clearance: 64.7 mL/min (by C-G  formula based on SCr of 0.88 mg/dL). Liver Function Tests: Recent Labs  Lab 09/21/24 1856 09/22/24 0434 09/24/24 0717  AST 60* 32 16  ALT 38 29 16  ALKPHOS 65 50 41  BILITOT 1.9* 1.3* 0.9  PROT 6.5 5.5* 5.2*  ALBUMIN  3.4* 2.7* 2.3*   No results for input(s): LIPASE, AMYLASE in the last 168 hours. No results for input(s): AMMONIA in the last 168 hours. Coagulation profile Recent Labs  Lab 09/22/24 0434  INR 1.4*    CBC: Recent Labs  Lab 09/21/24 1856 09/22/24 0434 09/23/24 0512 09/24/24 0717 09/25/24 0534  WBC 4.6 4.6 2.9* 2.7* 2.2*  NEUTROABS  --  3.7 2.4 1.9 1.0*  HGB 12.4 10.1* 10.2* 8.9* 9.6*  HCT 37.6 30.6* 30.5* 27.4* 30.0*  MCV 99.5 99.4 97.8 100.0 100.7*  PLT 98* 89* 78* 75* 114*   Cardiac Enzymes: Recent Labs  Lab 09/21/24 2241  CKTOTAL 234   BNP: Invalid input(s): POCBNP CBG: Recent Labs  Lab 09/25/24 0759 09/25/24 1158 09/25/24 1656 09/25/24 2128 09/26/24 0813  GLUCAP 102* 143* 90 185* 156*   D-Dimer No results for input(s): DDIMER in the last 72 hours. Hgb A1c No results for input(s): HGBA1C in the last 72 hours. Lipid Profile No results for input(s): CHOL, HDL, LDLCALC, TRIG, CHOLHDL, LDLDIRECT in the last 72 hours. Thyroid  function studies No results for input(s): TSH, T4TOTAL, T3FREE, THYROIDAB in the last 72 hours.  Invalid input(s): FREET3 Anemia work up No results for input(s): VITAMINB12, FOLATE, FERRITIN, TIBC, IRON, RETICCTPCT in the last 72 hours. Microbiology Recent Results (from the past 240 hours)  Resp panel by RT-PCR (RSV, Flu A&B, Covid) Anterior Nasal Swab     Status: None   Collection Time: 09/21/24  6:56 PM   Specimen: Anterior Nasal Swab  Result Value Ref Range Status   SARS Coronavirus 2 by RT PCR NEGATIVE NEGATIVE Final   Influenza A by PCR NEGATIVE NEGATIVE Final   Influenza B by PCR NEGATIVE NEGATIVE Final    Comment: (NOTE) The Xpert Xpress SARS-CoV-2/FLU/RSV  plus assay is intended as an aid in the diagnosis of influenza from Nasopharyngeal swab specimens and should not be used as a sole basis for treatment. Nasal washings and aspirates are unacceptable for Xpert Xpress SARS-CoV-2/FLU/RSV testing.  Fact Sheet for Patients: bloggercourse.com  Fact Sheet for Healthcare Providers: seriousbroker.it  This test is not yet approved or cleared by the [place]  FDA and has been authorized for detection and/or diagnosis of SARS-CoV-2 by FDA under an Emergency Use Authorization (EUA). This EUA will remain in effect (meaning this test can be used) for the duration of the COVID-19 declaration under Section 564(b)(1) of the Act, 21 U.S.C. section 360bbb-3(b)(1), unless the authorization is terminated or revoked.     Resp Syncytial Virus by PCR NEGATIVE NEGATIVE Final    Comment: (NOTE) Fact Sheet for Patients: bloggercourse.com  Fact Sheet for Healthcare Providers: seriousbroker.it  This test is not yet approved or cleared  by the [place]  FDA and has been authorized for detection and/or diagnosis of SARS-CoV-2 by FDA under an Emergency Use Authorization (EUA). This EUA will remain in effect (meaning this test can be used) for the duration of the COVID-19 declaration under Section 564(b)(1) of the Act, 21 U.S.C. section 360bbb-3(b)(1), unless the authorization is terminated or revoked.  Performed at [facility], [address] [place], [place] [address]   Blood culture (routine x 2)     Status: None   Collection Time: 09/21/24 10:40 PM   Specimen: BLOOD RIGHT ARM  Result Value Ref Range Status   Specimen Description BLOOD RIGHT ARM  Final   Special Requests   Final    BOTTLES DRAWN AEROBIC ONLY Blood Culture adequate volume   Culture   Final    NO GROWTH 5 DAYS Performed at [facility], [address]  [place], [place] [address]    Report Status 09/26/2024 FINAL  Final  Blood culture (routine x 2)     Status: None (Preliminary result)   Collection Time: 09/22/24  3:10 PM   Specimen: BLOOD  Result Value Ref Range Status   Specimen Description BLOOD SITE NOT SPECIFIED  Final   Special Requests   Final    BOTTLES DRAWN AEROBIC ONLY Blood Culture results may not be optimal due to an inadequate volume of blood received in culture bottles   Culture   Final    NO GROWTH 4 DAYS Performed at [facility], [address] [place], [place] [address]    Report Status PENDING  Incomplete    Time coordinating discharge: 45 minutes  Signed: Kru Allman  [facility] 09/26/2024, 1:25 PM

## 2024-09-25 NOTE — Progress Notes (Signed)
 PROGRESS NOTE  Kathleen Wiley  DOB: May 08, 1946  PCP: Dyane Anthony RAMAN, FNP FMW:[id number]  DOA: 09/21/2024  LOS: 4 days  Hospital Day: 5  Subjective: Patient was seen and examined this morning. Lying on bed.  Right leg feels better than presentation.   But complaining of some difficulty breathing today. Afebrile, hemodynamically stable, breathing on room air Labs this morning with improved hemoglobin, miscount remains low but stable  Brief narrative: Kathleen Wiley is a 78 y.o. female with PMH significant for DM2, HTN, HLD, OSA, A-fib not on anticoagulation, chronic thrombocytopenia, refusal to transfusion (Jehovah's Witness), GERD, arthritis, diabetic neuropathy 11/14, brought to ED by EMS for progressive generalized weakness for the last few days culminating to an episode of syncope.    Reports progressive generalized weakness for the last few days, progressively worsening redness, pain on her right leg.  On the day of presentation, patient was standing up to go to the bathroom, felt dizzy, lightheaded and passed out.  She fell forward, believes she hit her head on the bathroom floor. EMS noted urinary incontinence.  In the ED, patient was initially afebrile but later he spiked a fever of 101, mild tachycardia, blood pressure 122/58, breathing 3 L Initial labs with WBC count 4.6, platelet 98, lactic acid elevated 3.4, CK level normal, troponin normal Respiratory virus panel unremarkable Urinalysis with clear urine, with no bacteria Chest x-ray unremarkable EKG with sinus tachycardia at 104 BPM, QTc 460 ms  CT head did not show any acute intracranial abnormality, showed mild generalized atrophy and chronic ischemic small vessel disease CT angio chest did not show pulm embolism, showed mild cardiomegaly and evidence of pulm artery hypertension CT abdomen pelvis did not show any acute abnormality, raise suspicion of cirrhosis, splenomegaly and colonic diverticulosis without  diverticulitis Blood culture sent Patient was given IV fluid, started on IV Rocephin  Admitted to [facility]  Assessment and plan: Sepsis POA right lower EXTR cellulitis Presented with progressive redness, swelling and pain of right lower extremity Picture attached. In the ED, had fever, tachycardia, lactic acidosis but normal WBC count Blood culture sent, started on IV antibiotics Lactic acid level normalized Ultrasound duplex ruled out DVT but showed enlarged lymph nodes noted in the right groin, secondary to distal cellulitis. Had fever spike up to 103.7 on admission but remains afebrile for last 48 hours. WBC count low and lactic acid has normalized Currently on IV Ancef  for now. Cellulitis shrinking.  Pain improving. Switch to oral cefadroxil  today Continue to monitor.  Recent Labs  Lab 09/21/24 1856 09/21/24 2244 09/22/24 0054 09/22/24 0434 09/23/24 0512 09/24/24 0717 09/25/24 0534  WBC 4.6  --   --  4.6 2.9* 2.7* 2.2*  LATICACIDVEN  --  3.4* 1.6  --   --  0.8  --    Syncope POA Orthostatic hypotension Hypotension H/o hypertension Potentially orthostatic syncope in the setting of progressive weakness, poor oral intake, evidence of elevated creatinine 11/15 orthostatic vital signs measured.  Blood pressure dropped from 121/45 lying down to 91/70 on standing at 3 minutes.  Was hypotensive overnight 11/15 to 11/16. Given adequate IV hydration.  In the last 24 hours, blood pressure has improved and has consistently remained more than 100. PTA meds- Toprol  12.5 mg daily, Lasix  20 mg daily Currently both on hold.  Toprol  resumed today. Continue telemetry monitoring Echocardiogram with EF 60 to 65%, no WMA,,mild LVH  Paroxysmal A-fib Toprol  plan as above Not on anticoagulation  Acute on chronic anemia Chronic thrombocytopenia Jehovah's  Witness Hemoglobin at baseline more than 10.  Noted to drop to 8.9 today likely because of dilution.  No active bleeding. Platelet level low  but improving. Not on anticoagulation or antiplatelet Continue to monitor Recent Labs  Lab 09/21/24 1856 09/22/24 0434 09/23/24 0512 09/24/24 0717 09/25/24 0534  WBC 4.6 4.6 2.9* 2.7* 2.2*  NEUTROABS  --  3.7 2.4 1.9 1.0*  HGB 12.4 10.1* 10.2* 8.9* 9.6*  HCT 37.6 30.6* 30.5* 27.4* 30.0*  MCV 99.5 99.4 97.8 100.0 100.7*  PLT 98* 89* 78* 75* 114*   AKI on CKD 2 Baseline creatinine 0.88 from August. Presented with creatinine of 1.2.  Creatinine was elevated to peak at 1.41 on 11/16 due to prolonged hypotension.  Renal function remained stable  Recent Labs    06/13/24 0722 06/14/24 0620 07/05/24 1820 07/06/24 0510 07/08/24 0323 09/21/24 1856 09/22/24 0434 09/23/24 0512 09/24/24 0717 09/25/24 0534  BUN 10 7* 19 26* 13 19 24* 32* 28* 20  CREATININE 0.71 0.84 1.33* 1.11* 0.88 1.20* 1.11* 1.41* 0.99 0.88  CO2 22 23 22 22 26  19* 23 21* 19* 21*   Hyperkalemia Hypomagnesemia Potassium level improved with replacement. Magnesium  level was replaced Recent Labs  Lab 09/21/24 1856 09/21/24 2241 09/22/24 0434 09/23/24 0512 09/24/24 0717 09/25/24 0534  K 5.2*  --  4.1 4.2 4.2 4.3  MG  --  1.7 1.5*  --   --   --    Type 2 diabetes mellitus Diabetic neuropathy A1c 4.6 Diet controlled Continue SSI/Accu-Cheks Continue gabapentin  for neuropathy  Recent Labs  Lab 09/24/24 1220 09/24/24 1659 09/24/24 2045 09/25/24 0759 09/25/24 1158  GLUCAP 129* 129* 167* 102* 143*   HLD Crestor   Liver cirrhosis, splenomegaly Noted on CT abdomen on admission.  Previous CT abdomen from 2022 that showed fatty infiltration and splenomegaly No evidence of decompensation. To follow-up with GI as an outpatient.  Generalized weakness Impaired mobility PT eval obtained.  No follow-up recommended  Shortness of breath Mentioned difficulty breathing this morning.  Not wheezing.  Not on IV fluid. Continue to monitor.  If continues to worsen, will give IV Lasix     PT Follow up Rec: No Pt  Follow Up11/18/2025 1200    Goals of care   Code Status: Full Code     DVT prophylaxis:  SCDs Start: 09/21/24 2319   Antimicrobials: Cefadroxil  oral Fluid: None Consultants: None Family Communication: None at bedside  Status: Inpatient Level of care:  Progressive   Patient is from: Home Needs to continue in-hospital care: Hold discharge because of shortness of breath today Anticipated d/c to:  pending clinical course.  Hopefully home tomorrow   Diet:  Diet Order             Diet general           Diet regular Room service appropriate? Yes; Fluid consistency: Thin  Diet effective now                   Scheduled Meds:  cefadroxil   500 mg Oral BID   gabapentin   300 mg Oral BID   insulin  aspart  0-5 Units Subcutaneous QHS   insulin  aspart  0-9 Units Subcutaneous TID WC   metoprolol  succinate  12.5 mg Oral Daily   pantoprazole   40 mg Oral Daily   rosuvastatin   5 mg Oral Daily    PRN meds: acetaminophen  **OR** acetaminophen , camphor-menthol , diphenhydrAMINE , EPINEPHrine , melatonin, menthol , ondansetron  (ZOFRAN ) IV, phenol   Infusions:     Antimicrobials: Anti-infectives (  From admission, onward)    Start     Dose/Rate Route Frequency Ordered Stop   09/25/24 1600  cefadroxil  (DURICEF) capsule 500 mg        500 mg Oral 2 times daily 09/25/24 0925     09/25/24 0000  cefadroxil  (DURICEF) 500 MG capsule        1,000 mg Oral 2 times daily 09/25/24 1155 10/02/24 2359   09/23/24 2100  ceFAZolin  (ANCEF ) IVPB 1 g/50 mL premix  Status:  Discontinued        1 g 100 mL/hr over 30 Minutes Intravenous Every 8 hours 09/23/24 1142 09/25/24 0925   09/22/24 2300  linezolid  (ZYVOX ) IVPB 600 mg  Status:  Discontinued        600 mg 300 mL/hr over 60 Minutes Intravenous Every 12 hours 09/22/24 2154 09/23/24 1142   09/22/24 2200  cefTRIAXone  (ROCEPHIN ) 1 g in sodium chloride  0.9 % 100 mL IVPB  Status:  Discontinued        1 g 200 mL/hr over 30 Minutes Intravenous Every 24  hours 09/21/24 2326 09/22/24 2154   09/21/24 2145  cefTRIAXone  (ROCEPHIN ) 1 g in sodium chloride  0.9 % 100 mL IVPB        1 g 200 mL/hr over 30 Minutes Intravenous  Once 09/21/24 2139 09/21/24 2326       Objective: Vitals:   09/25/24 1202 09/25/24 1203  BP:    Pulse:    Resp:    Temp:    SpO2: 94% 92%    Intake/Output Summary (Last 24 hours) at 09/25/2024 1319 Last data filed at 09/25/2024 1100 Gross per 24 hour  Intake 656.54 ml  Output --  Net 656.54 ml   Filed Weights   09/23/24 0402 09/24/24 0400 09/25/24 0500  Weight: 106 kg 107.4 kg 107.2 kg   Weight change: -0.181 kg Body mass index is 39.32 kg/m.   Physical Exam: General exam: Pleasant, elderly Caucasian female.  Not in physical distress Skin: No rashes, lesions or ulcers. HEENT: Atraumatic, normocephalic, no obvious bleeding Lungs: No wheezing or crackles. CVS: S1, S2, no murmur,   GI/Abd: Soft, nontender, nondistended, bowel sound present,   CNS: Alert, awake, oriented x 3 Psychiatry: Mood appropriate Extremities: Left lower extremity normal.  Right lower extremity cellulitis improving.   Data Review: I have personally reviewed the laboratory data and studies available.  F/u labs ordered Unresulted Labs (From admission, onward)     Start     Ordered   09/22/24 0029  Rapid urine drug screen (hospital performed)  Add-on,   AD        09/22/24 0028            Signed, Chapman Rota, MD [facility] 09/25/2024

## 2024-09-26 ENCOUNTER — Other Ambulatory Visit (HOSPITAL_COMMUNITY): Payer: Self-pay

## 2024-09-26 DIAGNOSIS — L03115 Cellulitis of right lower limb: Secondary | ICD-10-CM | POA: Diagnosis not present

## 2024-09-26 LAB — CULTURE, BLOOD (ROUTINE X 2)
Culture: NO GROWTH
Special Requests: ADEQUATE

## 2024-09-26 LAB — GLUCOSE, CAPILLARY: Glucose-Capillary: 156 mg/dL — ABNORMAL HIGH (ref 70–99)

## 2024-09-26 NOTE — Plan of Care (Signed)
 Problem: Education: Goal: Ability to describe self-care measures that may prevent or decrease complications (Diabetes Survival Skills Education) will improve 09/26/2024 1205 by Marylu Randine NOVAK, RN Outcome: Adequate for Discharge 09/26/2024 1205 by Marylu Randine NOVAK, RN Outcome: Progressing Goal: Individualized Educational Video(s) 09/26/2024 1205 by Marylu Randine NOVAK, RN Outcome: Adequate for Discharge 09/26/2024 1205 by Marylu Randine NOVAK, RN Outcome: Progressing   Problem: Coping: Goal: Ability to adjust to condition or change in health will improve 09/26/2024 1205 by Marylu Randine NOVAK, RN Outcome: Adequate for Discharge 09/26/2024 1205 by Marylu Randine NOVAK, RN Outcome: Progressing   Problem: Fluid Volume: Goal: Ability to maintain a balanced intake and output will improve 09/26/2024 1205 by Marylu Randine NOVAK, RN Outcome: Adequate for Discharge 09/26/2024 1205 by Marylu Randine NOVAK, RN Outcome: Progressing   Problem: Health Behavior/Discharge Planning: Goal: Ability to identify and utilize available resources and services will improve 09/26/2024 1205 by Marylu Randine NOVAK, RN Outcome: Adequate for Discharge 09/26/2024 1205 by Marylu Randine NOVAK, RN Outcome: Progressing Goal: Ability to manage health-related needs will improve 09/26/2024 1205 by Marylu Randine NOVAK, RN Outcome: Adequate for Discharge 09/26/2024 1205 by Marylu Randine NOVAK, RN Outcome: Progressing   Problem: Metabolic: Goal: Ability to maintain appropriate glucose levels will improve 09/26/2024 1205 by Marylu Randine NOVAK, RN Outcome: Adequate for Discharge 09/26/2024 1205 by Marylu Randine NOVAK, RN Outcome: Progressing   Problem: Nutritional: Goal: Maintenance of adequate nutrition will improve 09/26/2024 1205 by Marylu Randine NOVAK, RN Outcome: Adequate for Discharge 09/26/2024 1205 by Marylu Randine NOVAK, RN Outcome: Progressing Goal: Progress toward achieving an optimal weight will improve 09/26/2024 1205 by Marylu Randine NOVAK, RN Outcome:  Adequate for Discharge 09/26/2024 1205 by Marylu Randine NOVAK, RN Outcome: Progressing   Problem: Skin Integrity: Goal: Risk for impaired skin integrity will decrease 09/26/2024 1205 by Marylu Randine NOVAK, RN Outcome: Adequate for Discharge 09/26/2024 1205 by Marylu Randine NOVAK, RN Outcome: Progressing   Problem: Tissue Perfusion: Goal: Adequacy of tissue perfusion will improve 09/26/2024 1205 by Marylu Randine NOVAK, RN Outcome: Adequate for Discharge 09/26/2024 1205 by Marylu Randine NOVAK, RN Outcome: Progressing   Problem: Clinical Measurements: Goal: Ability to avoid or minimize complications of infection will improve 09/26/2024 1205 by Marylu Randine NOVAK, RN Outcome: Adequate for Discharge 09/26/2024 1205 by Marylu Randine NOVAK, RN Outcome: Progressing   Problem: Skin Integrity: Goal: Skin integrity will improve 09/26/2024 1205 by Marylu Randine NOVAK, RN Outcome: Adequate for Discharge 09/26/2024 1205 by Marylu Randine NOVAK, RN Outcome: Progressing   Problem: Education: Goal: Knowledge of General Education information will improve Description: Including pain rating scale, medication(s)/side effects and non-pharmacologic comfort measures 09/26/2024 1205 by Marylu Randine NOVAK, RN Outcome: Adequate for Discharge 09/26/2024 1205 by Marylu Randine NOVAK, RN Outcome: Progressing   Problem: Health Behavior/Discharge Planning: Goal: Ability to manage health-related needs will improve 09/26/2024 1205 by Marylu Randine NOVAK, RN Outcome: Adequate for Discharge 09/26/2024 1205 by Marylu Randine NOVAK, RN Outcome: Progressing   Problem: Clinical Measurements: Goal: Ability to maintain clinical measurements within normal limits will improve 09/26/2024 1205 by Marylu Randine NOVAK, RN Outcome: Adequate for Discharge 09/26/2024 1205 by Marylu Randine NOVAK, RN Outcome: Progressing Goal: Will remain free from infection 09/26/2024 1205 by Marylu Randine NOVAK, RN Outcome: Adequate for Discharge 09/26/2024 1205 by Marylu Randine NOVAK,  RN Outcome: Progressing Goal: Diagnostic test results will improve 09/26/2024 1205 by Marylu Randine NOVAK, RN Outcome: Adequate for Discharge 09/26/2024 1205 by Marylu Randine NOVAK, RN Outcome: Progressing Goal: Respiratory complications will improve 09/26/2024 1205  by Marylu Randine NOVAK, RN Outcome: Adequate for Discharge 09/26/2024 1205 by Marylu Randine NOVAK, RN Outcome: Progressing Goal: Cardiovascular complication will be avoided 09/26/2024 1205 by Marylu Randine NOVAK, RN Outcome: Adequate for Discharge 09/26/2024 1205 by Marylu Randine NOVAK, RN Outcome: Progressing   Problem: Activity: Goal: Risk for activity intolerance will decrease 09/26/2024 1205 by Marylu Randine NOVAK, RN Outcome: Adequate for Discharge 09/26/2024 1205 by Marylu Randine NOVAK, RN Outcome: Progressing   Problem: Nutrition: Goal: Adequate nutrition will be maintained 09/26/2024 1205 by Marylu Randine NOVAK, RN Outcome: Adequate for Discharge 09/26/2024 1205 by Marylu Randine NOVAK, RN Outcome: Progressing   Problem: Coping: Goal: Level of anxiety will decrease 09/26/2024 1205 by Marylu Randine NOVAK, RN Outcome: Adequate for Discharge 09/26/2024 1205 by Marylu Randine NOVAK, RN Outcome: Progressing   Problem: Elimination: Goal: Will not experience complications related to bowel motility 09/26/2024 1205 by Marylu Randine NOVAK, RN Outcome: Adequate for Discharge 09/26/2024 1205 by Marylu Randine NOVAK, RN Outcome: Progressing Goal: Will not experience complications related to urinary retention 09/26/2024 1205 by Marylu Randine NOVAK, RN Outcome: Adequate for Discharge 09/26/2024 1205 by Marylu Randine NOVAK, RN Outcome: Progressing   Problem: Pain Managment: Goal: General experience of comfort will improve and/or be controlled 09/26/2024 1205 by Marylu Randine NOVAK, RN Outcome: Adequate for Discharge 09/26/2024 1205 by Marylu Randine NOVAK, RN Outcome: Progressing   Problem: Safety: Goal: Ability to remain free from injury will improve 09/26/2024 1205 by Marylu Randine NOVAK, RN Outcome: Adequate for Discharge 09/26/2024 1205 by Marylu Randine NOVAK, RN Outcome: Progressing   Problem: Skin Integrity: Goal: Risk for impaired skin integrity will decrease 09/26/2024 1205 by Marylu Randine NOVAK, RN Outcome: Adequate for Discharge 09/26/2024 1205 by Marylu Randine NOVAK, RN Outcome: Progressing

## 2024-09-26 NOTE — Plan of Care (Signed)
  Problem: Education: Goal: Ability to describe self-care measures that may prevent or decrease complications (Diabetes Survival Skills Education) will improve Outcome: Progressing Goal: Individualized Educational Video(s) Outcome: Progressing   Problem: Coping: Goal: Ability to adjust to condition or change in health will improve Outcome: Progressing   Problem: Fluid Volume: Goal: Ability to maintain a balanced intake and output will improve Outcome: Progressing   Problem: Health Behavior/Discharge Planning: Goal: Ability to identify and utilize available resources and services will improve Outcome: Progressing Goal: Ability to manage health-related needs will improve Outcome: Progressing   Problem: Metabolic: Goal: Ability to maintain appropriate glucose levels will improve Outcome: Progressing   Problem: Nutritional: Goal: Maintenance of adequate nutrition will improve Outcome: Progressing Goal: Progress toward achieving an optimal weight will improve Outcome: Progressing   Problem: Skin Integrity: Goal: Risk for impaired skin integrity will decrease Outcome: Progressing   Problem: Tissue Perfusion: Goal: Adequacy of tissue perfusion will improve Outcome: Progressing   Problem: Clinical Measurements: Goal: Ability to avoid or minimize complications of infection will improve Outcome: Progressing   Problem: Skin Integrity: Goal: Skin integrity will improve Outcome: Progressing   Problem: Education: Goal: Knowledge of General Education information will improve Description: Including pain rating scale, medication(s)/side effects and non-pharmacologic comfort measures Outcome: Progressing   Problem: Health Behavior/Discharge Planning: Goal: Ability to manage health-related needs will improve Outcome: Progressing   Problem: Clinical Measurements: Goal: Ability to maintain clinical measurements within normal limits will improve Outcome: Progressing Goal: Will  remain free from infection Outcome: Progressing Goal: Diagnostic test results will improve Outcome: Progressing Goal: Respiratory complications will improve Outcome: Progressing Goal: Cardiovascular complication will be avoided Outcome: Progressing   Problem: Activity: Goal: Risk for activity intolerance will decrease Outcome: Progressing   Problem: Nutrition: Goal: Adequate nutrition will be maintained Outcome: Progressing   Problem: Coping: Goal: Level of anxiety will decrease Outcome: Progressing   Problem: Elimination: Goal: Will not experience complications related to bowel motility Outcome: Progressing Goal: Will not experience complications related to urinary retention Outcome: Progressing   Problem: Pain Managment: Goal: General experience of comfort will improve and/or be controlled Outcome: Progressing   Problem: Safety: Goal: Ability to remain free from injury will improve Outcome: Progressing   Problem: Skin Integrity: Goal: Risk for impaired skin integrity will decrease Outcome: Progressing

## 2024-09-26 NOTE — Care Management Important Message (Signed)
 Important Message  Patient Details  Name: LINDSY CERULLO MRN: [id number] Date of Birth: 08/07/1946   Important Message Given:  Yes - Medicare IM     Vonzell Arrie Sharps 09/26/2024, 11:25 AM

## 2024-09-26 NOTE — Plan of Care (Signed)
   Problem: Coping: Goal: Ability to adjust to condition or change in health will improve Outcome: Progressing

## 2024-09-26 NOTE — Progress Notes (Signed)
 Discharge   Patient expressed verbal understanding of discharge POC.   Patient given time to ask any questions.  Additional education included in AVS.  Alert oriented in good spirits.   Tele and PIV removed.  Pressure dressings intact.  CCMD/Shea.   Discharging to [facility] pharm and [facility]

## 2024-09-27 LAB — CULTURE, BLOOD (ROUTINE X 2): Culture: NO GROWTH

## 2024-10-03 ENCOUNTER — Other Ambulatory Visit (HOSPITAL_COMMUNITY): Payer: Self-pay

## 2024-10-08 ENCOUNTER — Encounter: Payer: Self-pay | Admitting: Family Medicine

## 2024-10-10 NOTE — Progress Notes (Signed)
 Subjective:  Chief Complaint: follow-up for recurrent celluilitis   Patient ID: Kathleen Wiley, female    DOB: Jun 01, 1946, 78 y.o.   MRN: [id number]  HPI  Discussed the use of AI scribe software for clinical note transcription with the patient, who gave verbal consent to proceed.  History of Present Illness   Kathleen Wiley is a 78 year old female with recurrent cellulitis who presents for follow-up after hospitalization. She is accompanied by her husband.  She has a history of recurrent cellulitis, initially presenting after a pedicure in August 2025, which led to hospitalization for right greater than left bilateral cellulitis. She was treated with IV antibiotics, narrowed to Ancef  and cefadroxil . Despite having cefadroxil  on hand, she was unable to start it promptly enough to prevent further hospitalization.  During her second hospitalization in August 2025, she developed right-sided cellulitis and a rash while on clindamycin  and cefepime , leading to a switch to cefazolin , which she tolerated well, completing treatment with cefadroxil . She was stable at her last visit but has since been admitted again for cellulitis.  The cellulitis 'came on all of a sudden' despite having antibiotics at home. She has been hospitalized three times in 2025 for cellulitis.  She feels unwell and lacks energy, stating 'I just feel like I just need to go back to bed all the time.' A recent blood test showed a low white blood cell count of 1.9, and her neutrophils are also low at 900. She has a history of myelodysplastic syndrome, which may contribute to her susceptibility to infections.  She has been using cefadroxil  for treatment, but it is now depleted. She has not had any bloodstream infections from cellulitis. She is not allergic to penicillin  and has tolerated cefadroxil  well in the past.      Past Medical History:  Diagnosis Date   Anxiety    Arthritis    knee, hip   Cellulitis 08/2016   left leg    Complication of anesthesia    Diabetes mellitus without complication (HCC)    Drug rash 07/22/2024   GERD (gastroesophageal reflux disease)    Heart murmur    slight   informed years ago.-    Hernia, umbilical    History of atrial fibrillation 2007   with flagyl   History of endometriosis    Hypercholesteremia    Hypertension    has not been to a cardiologist   PONV (postoperative nausea and vomiting)    Rectal vaginal fistula    Refusal of blood transfusions as patient is Jehovah's Witness    Sleep apnea     Past Surgical History:  Procedure Laterality Date   APPENDECTOMY     BUNIONECTOMY Right 01/09/2019   Procedure: RIGHT FOOT BUNION CORRECTION;  Surgeon: Elsa Lonni SAUNDERS, MD;  Location: [facility] OR;  Service: Orthopedics;  Laterality: Right;   CHOLECYSTECTOMY N/A 03/16/2021   Procedure: LAPAROSCOPIC CHOLECYSTECTOMY;  Surgeon: Aron Shoulders, MD;  Location: [facility]S;  Service: General;  Laterality: N/A;   ENDOSCOPIC RETROGRADE CHOLANGIOPANCREATOGRAPHY (ERCP) WITH PROPOFOL  N/A 03/15/2021   Procedure: ENDOSCOPIC RETROGRADE CHOLANGIOPANCREATOGRAPHY (ERCP) WITH PROPOFOL ;  Surgeon: Saintclair Jasper, MD;  Location: [facility];  Service: Gastroenterology;  Laterality: N/A;   EXPLORATORY LAPAROTOMY  1973   for endometrosis   HAMMERTOE RECONSTRUCTION WITH WEIL OSTEOTOMY Right 01/09/2019   Procedure: RIGHT FOOT 2-4 HAMMERTOE CORRECTION WITH WEIL OSTEOTOMY 2ND METATARSAL, DEEP TENDON TRANSFER WITH AKIN OSTEOTOMY;  Surgeon: Elsa Lonni SAUNDERS, MD;  Location: [facility] OR;  Service: Orthopedics;  Laterality: Right;   I & D EXTREMITY Right 06/10/2016   Procedure: IRRIGATION AND DEBRIDEMENT THUMB FLEXOR SHEATH;  Surgeon: Franky Curia, MD;  Location: [facility] OR;  Service: Orthopedics;  Laterality: Right;   I & D EXTREMITY Right 04/23/2022   Procedure: RIGHT LEG DEBRIDEMENT;  Surgeon: Harden Jerona GAILS, MD;  Location: [facility] OR;  Service: Orthopedics;  Laterality: Right;   JOINT REPLACEMENT     Left Hip   MANDIBLE SURGERY   1978   rectovaginal fistula repair  1980   REMOVAL OF STONES  03/15/2021   Procedure: REMOVAL OF STONES;  Surgeon: Saintclair Jasper, MD;  Location: [facility];  Service: Gastroenterology;;   ANNETT  03/15/2021   Procedure: ANNETT;  Surgeon: Saintclair Jasper, MD;  Location: [facility];  Service: Gastroenterology;;   TOTAL HIP ARTHROPLASTY  05/08/2012   Procedure: TOTAL HIP ARTHROPLASTY;  Surgeon: Dempsey JINNY Sensor, MD;  Location: [facility] OR;  Service: Orthopedics;  Laterality: Left;   TOTAL HIP ARTHROPLASTY Right 09/18/2018   Procedure: TOTAL HIP ARTHROPLASTY ANTERIOR APPROACH;  Surgeon: Sensor Dempsey, MD;  Location: [facility];  Service: Orthopedics;  Laterality: Right;   TOTAL KNEE ARTHROPLASTY Right 01/24/2013   Dr Sensor   TOTAL KNEE ARTHROPLASTY Right 01/24/2013   Procedure: RIGHT TOTAL KNEE ARTHROPLASTY;  Surgeon: Dempsey JINNY Sensor, MD;  Location: [facility] OR;  Service: Orthopedics;  Laterality: Right;    Family History  Problem Relation Age of Onset   Dementia Mother    Cancer Father    Breast cancer Neg Hx       Social History   Socioeconomic History   Marital status: Married    Spouse name: Not on file   Number of children: 1   Years of education: Not on file   Highest education level: Not on file  Occupational History   Not on file  Tobacco Use   Smoking status: Former    Current packs/day: 0.00    Average packs/day: 1 pack/day for 10.0 years (10.0 ttl pk-yrs)    Types: Cigarettes    Start date: 11/09/1971    Quit date: 11/08/1981    Years since quitting: 42.9   Smokeless tobacco: Never  Vaping Use   Vaping status: Never Used  Substance and Sexual Activity   Alcohol use: No   Drug use: No   Sexual activity: Not on file  Other Topics Concern   Not on file  Social History Narrative   Not on file   Social Drivers of Health   Financial Resource Strain: Not on file  Food Insecurity: No Food Insecurity (09/22/2024)   Hunger Vital Sign    Worried About Running Out of Food in the  Last Year: Never true    Ran Out of Food in the Last Year: Never true  Transportation Needs: No Transportation Needs (09/22/2024)   PRAPARE - Administrator, Civil Service (Medical): No    Lack of Transportation (Non-Medical): No  Physical Activity: Not on file  Stress: Not on file  Social Connections: Moderately Integrated (09/22/2024)   Social Connection and Isolation Panel    Frequency of Communication with Friends and Family: More than three times a week    Frequency of Social Gatherings with Friends and Family: Twice a week    Attends Religious Services: More than 4 times per year    Active Member of [employer] or Organizations: No    Attends Banker Meetings: Never    Marital Status: Married    Allergies  Allergen Reactions   Other Other (See Comments)    Refuses whole blood but does accept albumin    Rocephin  [Ceftriaxone ] Anaphylaxis and Swelling    09/22/24: Patient reported lip swelling, sensation of throat closing, and pruritus while receiving second dose of Rocephin . She was also hypotensive but that appears to have started prior to Rocephin    Ditropan  [Oxybutynin ] Other (See Comments)    Unknown reaction   Flagyl [Metronidazole] Other (See Comments)    Chest pain Induced A-fib    Lipitor [Atorvastatin] Other (See Comments)    Unknown reaction   Firvanq  [Vancomycin ] Itching   Maxipime  [Cefepime ] Rash    Has tolerated cefazolin , cefadroxil    Septra [Sulfamethoxazole-Trimethoprim] Rash    Blistering rash      Current Outpatient Medications:    acetaminophen  (TYLENOL ) 325 MG tablet, Take 2 tablets (650 mg total) by mouth every 6 (six) hours as needed for mild pain (pain score 1-3) (or Fever >/= 101)., Disp: , Rfl:    Cholecalciferol  (VITAMIN D-3 PO), Take 1 capsule by mouth daily., Disp: , Rfl:    Cyanocobalamin  (VITAMIN B-12 PO), Take 1 tablet by mouth daily., Disp: , Rfl:    escitalopram  (LEXAPRO ) 20 MG tablet, Take 20 mg by mouth at bedtime.,  Disp: , Rfl:    furosemide  (LASIX ) 20 MG tablet, Take 20 mg by mouth daily., Disp: , Rfl:    gabapentin  (NEURONTIN ) 300 MG capsule, Take 300 mg by mouth in the morning and at bedtime., Disp: , Rfl:    ibuprofen  (ADVIL ) 200 MG tablet, Take 400-600 mg by mouth See admin instructions. Take 3 tablets (600mg ) by mouth every morning and take 2 tablets (400mg ) at bedtime., Disp: , Rfl:    metoprolol  succinate (TOPROL  XL) 25 MG 24 hr tablet, Take 0.5 tablets (12.5 mg total) by mouth daily. (Patient taking differently: Take 25 mg by mouth at bedtime.), Disp: , Rfl:    nystatin cream (MYCOSTATIN), Apply 1 Application topically 2 (two) times daily as needed (skin irritation)., Disp: , Rfl:    omeprazole (PRILOSEC) 20 MG capsule, Take 20 mg by mouth daily., Disp: , Rfl:    potassium chloride  (KLOR-CON ) 10 MEQ tablet, Take 10 mEq by mouth daily., Disp: , Rfl:    rosuvastatin  (CRESTOR ) 5 MG tablet, Take 5 mg by mouth at bedtime., Disp: , Rfl:    Review of Systems  Constitutional:  Positive for fatigue. Negative for activity change, appetite change, chills, diaphoresis, fever and unexpected weight change.  HENT:  Negative for congestion, rhinorrhea, sinus pressure, sneezing, sore throat and trouble swallowing.   Eyes:  Negative for photophobia and visual disturbance.  Respiratory:  Negative for cough, chest tightness, shortness of breath, wheezing and stridor.   Cardiovascular:  Negative for chest pain, palpitations and leg swelling.  Gastrointestinal:  Negative for abdominal distention, abdominal pain, anal bleeding, blood in stool, constipation, diarrhea, nausea and vomiting.  Genitourinary:  Negative for difficulty urinating, dysuria, flank pain and hematuria.  Musculoskeletal:  Negative for arthralgias, back pain, gait problem, joint swelling and myalgias.  Skin:  Negative for color change, pallor, rash and wound.  Neurological:  Negative for dizziness, tremors, weakness and light-headedness.   Hematological:  Negative for adenopathy. Does not bruise/bleed easily.  Psychiatric/Behavioral:  Negative for agitation, behavioral problems, confusion, decreased concentration, dysphoric mood and sleep disturbance.        Objective:   Physical Exam Constitutional:      General: She is not in acute distress.    Appearance: Normal appearance. She  is well-developed. She is not ill-appearing or diaphoretic.  HENT:     Head: Normocephalic and atraumatic.     Right Ear: Hearing and external ear normal.     Left Ear: Hearing and external ear normal.     Nose: No nasal deformity or rhinorrhea.  Eyes:     General: No scleral icterus.    Conjunctiva/sclera: Conjunctivae normal.     Right eye: Right conjunctiva is not injected.     Left eye: Left conjunctiva is not injected.     Pupils: Pupils are equal, round, and reactive to light.  Neck:     Vascular: No JVD.  Cardiovascular:     Rate and Rhythm: Normal rate and regular rhythm.     Heart sounds: S1 normal and S2 normal.  Pulmonary:     Effort: Pulmonary effort is normal. No respiratory distress.     Breath sounds: No wheezing.  Abdominal:     General: There is no distension.     Palpations: Abdomen is soft.  Musculoskeletal:        General: Normal range of motion.     Right shoulder: Normal.     Left shoulder: Normal.     Cervical back: Normal range of motion and neck supple.     Right hip: Normal.     Left hip: Normal.     Right knee: Normal.     Left knee: Normal.  Lymphadenopathy:     Head:     Right side of head: No submandibular, preauricular or posterior auricular adenopathy.     Left side of head: No submandibular, preauricular or posterior auricular adenopathy.     Cervical: No cervical adenopathy.     Right cervical: No superficial or deep cervical adenopathy.    Left cervical: No superficial or deep cervical adenopathy.  Skin:    General: Skin is warm and dry.     Coloration: Skin is not pale.     Findings:  Erythema present. No abrasion, bruising, ecchymosis, lesion or rash.     Nails: There is no clubbing.  Neurological:     General: No focal deficit present.     Mental Status: She is alert and oriented to person, place, and time.     Sensory: No sensory deficit.     Coordination: Coordination normal.     Gait: Gait normal.  Psychiatric:        Attention and Perception: She is attentive.        Mood and Affect: Mood normal.        Speech: Speech normal.        Behavior: Behavior normal. Behavior is cooperative.        Thought Content: Thought content normal.        Judgment: Judgment normal.     Right leg        Assessment & Plan:   Assessment and Plan    Recurrent cellulitis with recent sepsis, requiring suppressive antibiotics Recurrent cellulitis with recent sepsis necessitates suppressive antibiotics due to failed reactive strategyx TWO and compromised immunity. Amoxicillin  chosen for its narrower spectrum and lower C. diff risk. Discussed chronic antibiotic risks, including gut microbiome impact and C. diff colitis. - Prescribed amoxicillin  500 mg twice daily. - Instructed to take an extra dose and call if cellulitis symptoms develop. - Advised increased fluid intake if infection symptoms occur. 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 Neutrophils at 900. Discussed infection risk and prophylactic antibiotics. - Continue monitoring white blood cell count and neutrophil levels.  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 Oncologist involved. Discussed as a precancerous bone marrow condition. - Continue follow-up with oncologist for evaluation and management.  Fatigue likely secondary to cytopenias Fatigue likely due to  cytopenias, including low white blood cell count and neutrophils. Discussed impact on energy levels. - Continue to monitor symptoms and energy levels.

## 2024-10-11 ENCOUNTER — Ambulatory Visit: Admitting: Infectious Disease

## 2024-10-12 ENCOUNTER — Ambulatory Visit: Admitting: Infectious Disease

## 2024-10-12 ENCOUNTER — Other Ambulatory Visit: Payer: Self-pay

## 2024-10-12 ENCOUNTER — Encounter: Payer: Self-pay | Admitting: Infectious Disease

## 2024-10-12 VITALS — BP 130/83 | HR 69 | Temp 97.3°F | Ht 65.0 in | Wt 222.0 lb

## 2024-10-12 DIAGNOSIS — B954 Other streptococcus as the cause of diseases classified elsewhere: Secondary | ICD-10-CM

## 2024-10-12 DIAGNOSIS — R55 Syncope and collapse: Secondary | ICD-10-CM

## 2024-10-12 DIAGNOSIS — L03115 Cellulitis of right lower limb: Secondary | ICD-10-CM | POA: Diagnosis not present

## 2024-10-12 DIAGNOSIS — R5383 Other fatigue: Secondary | ICD-10-CM

## 2024-10-12 DIAGNOSIS — D469 Myelodysplastic syndrome, unspecified: Secondary | ICD-10-CM | POA: Diagnosis not present

## 2024-10-12 DIAGNOSIS — L0390 Cellulitis, unspecified: Secondary | ICD-10-CM

## 2024-10-12 MED ORDER — AMOXICILLIN 500 MG PO TABS: ORAL_TABLET | ORAL | 11 refills | Status: DC

## 2024-10-15 ENCOUNTER — Telehealth: Payer: Self-pay

## 2024-10-15 NOTE — Telephone Encounter (Signed)
 Patient called for clarification on Amoxicillin  instructions.   Per Dr.Van Dam - take 1 tablet BID, if cellulitis flares up take 1 tablet TID.  Patient voiced her understanding.    Kathleen Wiley Kathleen Wiley, CMA

## 2024-10-17 ENCOUNTER — Ambulatory Visit: Admitting: Podiatry

## 2024-10-17 ENCOUNTER — Other Ambulatory Visit: Payer: Self-pay | Admitting: Hematology and Oncology

## 2024-10-17 ENCOUNTER — Inpatient Hospital Stay: Attending: Hematology and Oncology

## 2024-10-17 ENCOUNTER — Ambulatory Visit (INDEPENDENT_AMBULATORY_CARE_PROVIDER_SITE_OTHER)

## 2024-10-17 ENCOUNTER — Encounter: Payer: Self-pay | Admitting: Podiatry

## 2024-10-17 ENCOUNTER — Ambulatory Visit: Admitting: Hematology and Oncology

## 2024-10-17 VITALS — BP 119/62 | HR 74 | Temp 98.4°F | Resp 14 | Wt 220.4 lb

## 2024-10-17 VITALS — Ht 65.0 in | Wt 222.0 lb

## 2024-10-17 DIAGNOSIS — M79675 Pain in left toe(s): Secondary | ICD-10-CM

## 2024-10-17 DIAGNOSIS — M7751 Other enthesopathy of right foot: Secondary | ICD-10-CM | POA: Diagnosis not present

## 2024-10-17 DIAGNOSIS — R197 Diarrhea, unspecified: Secondary | ICD-10-CM

## 2024-10-17 DIAGNOSIS — D708 Other neutropenia: Secondary | ICD-10-CM

## 2024-10-17 DIAGNOSIS — D46Z Other myelodysplastic syndromes: Secondary | ICD-10-CM | POA: Diagnosis not present

## 2024-10-17 DIAGNOSIS — L97512 Non-pressure chronic ulcer of other part of right foot with fat layer exposed: Secondary | ICD-10-CM

## 2024-10-17 DIAGNOSIS — L03115 Cellulitis of right lower limb: Secondary | ICD-10-CM

## 2024-10-17 DIAGNOSIS — D539 Nutritional anemia, unspecified: Secondary | ICD-10-CM

## 2024-10-17 DIAGNOSIS — B351 Tinea unguium: Secondary | ICD-10-CM

## 2024-10-17 DIAGNOSIS — M79674 Pain in right toe(s): Secondary | ICD-10-CM

## 2024-10-17 DIAGNOSIS — E08621 Diabetes mellitus due to underlying condition with foot ulcer: Secondary | ICD-10-CM | POA: Diagnosis not present

## 2024-10-17 DIAGNOSIS — D46A Refractory cytopenia with multilineage dysplasia: Secondary | ICD-10-CM | POA: Diagnosis present

## 2024-10-17 DIAGNOSIS — Z791 Long term (current) use of non-steroidal anti-inflammatories (NSAID): Secondary | ICD-10-CM | POA: Diagnosis not present

## 2024-10-17 DIAGNOSIS — Z87891 Personal history of nicotine dependence: Secondary | ICD-10-CM | POA: Insufficient documentation

## 2024-10-17 DIAGNOSIS — Z792 Long term (current) use of antibiotics: Secondary | ICD-10-CM | POA: Insufficient documentation

## 2024-10-17 DIAGNOSIS — Z79899 Other long term (current) drug therapy: Secondary | ICD-10-CM | POA: Insufficient documentation

## 2024-10-17 LAB — CBC WITH DIFFERENTIAL (CANCER CENTER ONLY)
Abs Immature Granulocytes: 0.08 K/uL — ABNORMAL HIGH (ref 0.00–0.07)
Basophils Absolute: 0 K/uL (ref 0.0–0.1)
Basophils Relative: 1 %
Eosinophils Absolute: 0.1 K/uL (ref 0.0–0.5)
Eosinophils Relative: 4 %
HCT: 33.6 % — ABNORMAL LOW (ref 36.0–46.0)
Hemoglobin: 11.3 g/dL — ABNORMAL LOW (ref 12.0–15.0)
Immature Granulocytes: 5 %
Lymphocytes Relative: 51 %
Lymphs Abs: 0.9 K/uL (ref 0.7–4.0)
MCH: 32.7 pg (ref 26.0–34.0)
MCHC: 33.6 g/dL (ref 30.0–36.0)
MCV: 97.1 fL (ref 80.0–100.0)
Monocytes Absolute: 0.2 K/uL (ref 0.1–1.0)
Monocytes Relative: 11 %
Neutro Abs: 0.5 K/uL — ABNORMAL LOW (ref 1.7–7.7)
Neutrophils Relative %: 28 %
Platelet Count: 132 K/uL — ABNORMAL LOW (ref 150–400)
RBC: 3.46 MIL/uL — ABNORMAL LOW (ref 3.87–5.11)
RDW: 15.4 % (ref 11.5–15.5)
WBC Count: 1.7 K/uL — ABNORMAL LOW (ref 4.0–10.5)
nRBC: 0 % (ref 0.0–0.2)

## 2024-10-17 LAB — CMP (CANCER CENTER ONLY)
ALT: 11 U/L (ref 0–44)
AST: 23 U/L (ref 15–41)
Albumin: 4.1 g/dL (ref 3.5–5.0)
Alkaline Phosphatase: 76 U/L (ref 38–126)
Anion gap: 10 (ref 5–15)
BUN: 12 mg/dL (ref 8–23)
CO2: 26 mmol/L (ref 22–32)
Calcium: 8.8 mg/dL — ABNORMAL LOW (ref 8.9–10.3)
Chloride: 104 mmol/L (ref 98–111)
Creatinine: 0.96 mg/dL (ref 0.44–1.00)
GFR, Estimated: >60 mL/min (ref >60)
Glucose, Bld: 135 mg/dL — ABNORMAL HIGH (ref 70–99)
Potassium: 4.6 mmol/L (ref 3.5–5.1)
Sodium: 140 mmol/L (ref 135–145)
Total Bilirubin: 0.8 mg/dL (ref 0.0–1.2)
Total Protein: 7.4 g/dL (ref 6.5–8.1)

## 2024-10-17 LAB — LACTATE DEHYDROGENASE: LDH: 266 U/L — ABNORMAL HIGH (ref 105–235)

## 2024-10-17 NOTE — Progress Notes (Signed)
 [facility] Telephone:([phone]) [phone]   [phone]  PROGRESS NOTE  Patient Care Team: Dyane Anthony RAMAN, FNP as PCP - General (Family Medicine) Pietro Redell RAMAN, MD as PCP - Cardiology (Cardiology)  CHIEF COMPLAINTS/PURPOSE OF CONSULTATION:  Myelodysplastic Syndrome with Multilineage Dysplasia.   HISTORY OF PRESENTING ILLNESS:  Kathleen Wiley 78 y.o. female returns for a follow up for low grade MDS. She is unaccompanied for this visit.   On exam today, [name] reports she she has been well overall in the interim since our last visit.  She did however have a hospitalization where she developed cellulitis in her right leg and was hospitalized.  She reports she has made a good recovery from this but there is still some residual redness in her leg.  She reports that she completed her antibiotic therapy and has been following with ID who is recommending taking a little bit of antibiotics indefinitely.  She think she is taking amoxicillin  twice daily.  She reports her energy levels today are about a 4 out of 10.  Her appetite is reportedly okay.  She reports that she does bruise easily when she bumps something.  She reports that she has had no overt signs of bleeding such as nosebleeds, gum bleeding, or blood in the urine/stool.  She reports that she has had occasional bouts of diarrhea as well as nausea.  The last 5 days she has been having some loose watery stools.  She reports that she will address this with her infectious disease doctor Dr. Fleeta Rothman.  Otherwise she denies any fevers, chills, sweats.  A full 10 point ROS is otherwise negative.  MEDICAL HISTORY:  Past Medical History:  Diagnosis Date   Anxiety    Arthritis    knee, hip   Cellulitis 08/2016   left leg   Complication of anesthesia    Diabetes mellitus without complication (HCC)    Drug rash 07/22/2024   GERD (gastroesophageal reflux disease)    Heart murmur    slight   informed years ago.-     Hernia, umbilical    History of atrial fibrillation 2007   with flagyl   History of endometriosis    Hypercholesteremia    Hypertension    has not been to a cardiologist   PONV (postoperative nausea and vomiting)    Rectal vaginal fistula    Refusal of blood transfusions as patient is Jehovah's Witness    Sleep apnea     SURGICAL HISTORY: Past Surgical History:  Procedure Laterality Date   APPENDECTOMY     BUNIONECTOMY Right 01/09/2019   Procedure: RIGHT FOOT BUNION CORRECTION;  Surgeon: Elsa Lonni SAUNDERS, MD;  [facility]: [facility] OR;  Service: Orthopedics;  Laterality: Right;   CHOLECYSTECTOMY N/A 03/16/2021   Procedure: LAPAROSCOPIC CHOLECYSTECTOMY;  Surgeon: Aron Shoulders, MD;  Location: [facility] ORS;  Service: General;  Laterality: N/A;   ENDOSCOPIC RETROGRADE CHOLANGIOPANCREATOGRAPHY (ERCP) WITH PROPOFOL  N/A 03/15/2021   Procedure: ENDOSCOPIC RETROGRADE CHOLANGIOPANCREATOGRAPHY (ERCP) WITH PROPOFOL ;  Surgeon: Saintclair Jasper, MD;  Location: [facility];  Service: Gastroenterology;  Laterality: N/A;   EXPLORATORY LAPAROTOMY  1973   for endometrosis   HAMMERTOE RECONSTRUCTION WITH WEIL OSTEOTOMY Right 01/09/2019   Procedure: RIGHT FOOT 2-4 HAMMERTOE CORRECTION WITH WEIL OSTEOTOMY 2ND METATARSAL, DEEP TENDON TRANSFER WITH AKIN OSTEOTOMY;  Surgeon: Elsa Lonni SAUNDERS, MD;  Location: [facility] OR;  Service: Orthopedics;  Laterality: Right;   I & D EXTREMITY Right 06/10/2016   Procedure: IRRIGATION AND DEBRIDEMENT THUMB FLEXOR SHEATH;  Surgeon: Franky Curia, MD;  Location: [facility] OR;  Service: Orthopedics;  Laterality: Right;   I & D EXTREMITY Right 04/23/2022   Procedure: RIGHT LEG DEBRIDEMENT;  Surgeon: Harden Jerona GAILS, MD;  Location: [facility] OR;  Service: Orthopedics;  Laterality: Right;   JOINT REPLACEMENT     Left Hip   MANDIBLE SURGERY  1978   rectovaginal fistula repair  1980   REMOVAL OF STONES  03/15/2021   Procedure: REMOVAL OF STONES;  Surgeon: Saintclair Jasper, MD;  Location: [facility];  Service:  Gastroenterology;;   ANNETT  03/15/2021   Procedure: ANNETT;  Surgeon: Saintclair Jasper, MD;  Location: [facility];  Service: Gastroenterology;;   TOTAL HIP ARTHROPLASTY  05/08/2012   Procedure: TOTAL HIP ARTHROPLASTY;  Surgeon: Dempsey JINNY Sensor, MD;  Location: [facility] OR;  Service: Orthopedics;  Laterality: Left;   TOTAL HIP ARTHROPLASTY Right 09/18/2018   Procedure: TOTAL HIP ARTHROPLASTY ANTERIOR APPROACH;  Surgeon: Sensor Dempsey, MD;  Location: [facility];  Service: Orthopedics;  Laterality: Right;   TOTAL KNEE ARTHROPLASTY Right 01/24/2013   Dr Sensor   TOTAL KNEE ARTHROPLASTY Right 01/24/2013   Procedure: RIGHT TOTAL KNEE ARTHROPLASTY;  Surgeon: Dempsey JINNY Sensor, MD;  Location: [facility] OR;  Service: Orthopedics;  Laterality: Right;    SOCIAL HISTORY: Social History   Socioeconomic History   Marital status: Married    Spouse name: Not on file   Number of children: 1   Years of education: Not on file   Highest education level: Not on file  Occupational History   Not on file  Tobacco Use   Smoking status: Former    Current packs/day: 0.00    Average packs/day: 1 pack/day for 10.0 years (10.0 ttl pk-yrs)    Types: Cigarettes    Start date: 11/09/1971    Quit date: 11/08/1981    Years since quitting: 42.9   Smokeless tobacco: Never  Vaping Use   Vaping status: Never Used  Substance and Sexual Activity   Alcohol use: No   Drug use: No   Sexual activity: Not on file  Other Topics Concern   Not on file  Social History Narrative   Not on file   Social Drivers of Health   Tobacco Use: Medium Risk (10/17/2024)   Patient History    Smoking Tobacco Use: Former    Smokeless Tobacco Use: Never    Passive Exposure: Not on Actuary Strain: Not on file  Food Insecurity: No Food Insecurity (09/22/2024)   Epic    Worried About Programme Researcher, Broadcasting/film/video in the Last Year: Never true    Ran Out of Food in the Last Year: Never true  Transportation Needs: No Transportation Needs  (09/22/2024)   Epic    Lack of Transportation (Medical): No    Lack of Transportation (Non-Medical): No  Physical Activity: Not on file  Stress: Not on file  Social Connections: Moderately Integrated (09/22/2024)   Social Connection and Isolation Panel    Frequency of Communication with Friends and Family: More than three times a week    Frequency of Social Gatherings with Friends and Family: Twice a week    Attends Religious Services: More than 4 times per year    Active Member of [employer] or Organizations: No    Attends Banker Meetings: Never    Marital Status: Married  Catering Manager Violence: Not At Risk (09/22/2024)   Epic    Fear of Current or Ex-Partner: No    Emotionally Abused:  No    Physically Abused: No    Sexually Abused: No  Depression (PHQ2-9): Not on file  Alcohol Screen: Not on file  Housing: Low Risk (09/22/2024)   Epic    Unable to Pay for Housing in the Last Year: No    Number of Times Moved in the Last Year: 0    Homeless in the Last Year: No  Utilities: Not At Risk (09/22/2024)   Epic    Threatened with loss of utilities: No  Health Literacy: Not on file    FAMILY HISTORY: Family History  Problem Relation Age of Onset   Dementia Mother    Cancer Father    Breast cancer Neg Hx     ALLERGIES:  is allergic to other, rocephin  [ceftriaxone ], ditropan  [oxybutynin ], flagyl [metronidazole], lipitor [atorvastatin], firvanq  [vancomycin ], maxipime  [cefepime ], and septra [sulfamethoxazole-trimethoprim].  MEDICATIONS:  Current Outpatient Medications  Medication Sig Dispense Refill   acetaminophen  (TYLENOL ) 325 MG tablet Take 2 tablets (650 mg total) by mouth every 6 (six) hours as needed for mild pain (pain score 1-3) (or Fever >/= 101).     amoxicillin  (AMOXIL ) 500 MG tablet Take two tablets twice daily to suppress cellulitis and if you have flare of cellulitis switch to one tablet TID 90 tablet 11   Cholecalciferol  (VITAMIN D-3 PO) Take 1 capsule  by mouth daily.     Cyanocobalamin  (VITAMIN B-12 PO) Take 1 tablet by mouth daily.     escitalopram  (LEXAPRO ) 20 MG tablet Take 20 mg by mouth at bedtime.     furosemide  (LASIX ) 20 MG tablet Take 20 mg by mouth daily.     gabapentin  (NEURONTIN ) 300 MG capsule Take 300 mg by mouth in the morning and at bedtime.     ibuprofen  (ADVIL ) 200 MG tablet Take 400-600 mg by mouth See admin instructions. Take 3 tablets (600mg ) by mouth every morning and take 2 tablets (400mg ) at bedtime.     metoprolol  succinate (TOPROL  XL) 25 MG 24 hr tablet Take 0.5 tablets (12.5 mg total) by mouth daily. (Patient taking differently: Take 25 mg by mouth at bedtime.)     mupirocin  ointment (BACTROBAN ) 2 % Apply 1 Application topically 2 (two) times daily.     nystatin cream (MYCOSTATIN) Apply 1 Application topically 2 (two) times daily as needed (skin irritation).     omeprazole (PRILOSEC) 20 MG capsule Take 20 mg by mouth daily.     potassium chloride  (KLOR-CON ) 10 MEQ tablet Take 10 mEq by mouth daily.     rosuvastatin  (CRESTOR ) 5 MG tablet Take 5 mg by mouth at bedtime.     No current facility-administered medications for this visit.    REVIEW OF SYSTEMS:   Constitutional: ( - ) fevers, ( - )  chills , ( - ) night sweats Eyes: ( - ) blurriness of vision, ( - ) double vision, ( - ) watery eyes Ears, nose, mouth, throat, and face: ( - ) mucositis, ( - ) sore throat Respiratory: ( - ) cough, ( - ) dyspnea, ( - ) wheezes Cardiovascular: ( - ) palpitation, ( - ) chest discomfort, ( - ) lower extremity swelling Gastrointestinal:  ( - ) nausea, ( - ) heartburn, ( - ) change in bowel habits Skin: ( - ) abnormal skin rashes Lymphatics: ( - ) new lymphadenopathy, ( - ) easy bruising Neurological: ( - ) numbness, ( - ) tingling, ( - ) new weaknesses Behavioral/Psych: ( - ) mood change, ( - ) new changes  All other systems were reviewed with the patient and are negative.  PHYSICAL EXAMINATION:  Vitals:   10/17/24 1158   BP: 119/62  Pulse: 74  Resp: 14  Temp: 98.4 F (36.9 C)  SpO2: 99%     Filed Weights   10/17/24 1158  Weight: 220 lb 6.4 oz (100 kg)      GENERAL: well appearing elderly Caucasian female in NAD  SKIN: skin color, texture, turgor are normal, no rashes or significant lesions EYES: conjunctiva are pink and non-injected, sclera clear LUNGS: clear to auscultation and percussion with normal breathing effort HEART: regular rate & rhythm and no murmurs. Bilateral lower extremity edema.  Musculoskeletal: no cyanosis of digits and no clubbing  PSYCH: alert & oriented x 3, fluent speech NEURO: no focal motor/sensory deficits  LABORATORY DATA:  I have reviewed the data as listed    Latest Ref Rng & Units 10/17/2024   11:29 AM 09/25/2024    5:34 AM 09/24/2024    7:17 AM  CBC  WBC 4.0 - 10.5 K/uL 1.7  2.2  2.7   Hemoglobin 12.0 - 15.0 g/dL 88.6  9.6  8.9   Hematocrit 36.0 - 46.0 % 33.6  30.0  27.4   Platelets 150 - 400 K/uL 132  114  75        Latest Ref Rng & Units 10/17/2024   11:29 AM 09/25/2024    5:34 AM 09/24/2024    7:17 AM  CMP  Glucose 70 - 99 mg/dL 864  892  879   BUN 8 - 23 mg/dL 12  20  28    Creatinine 0.44 - 1.00 mg/dL 9.03  9.11  9.00   Sodium 135 - 145 mmol/L 140  143  138   Potassium 3.5 - 5.1 mmol/L 4.6  4.3  4.2   Chloride 98 - 111 mmol/L 104  107  109   CO2 22 - 32 mmol/L 26  21  19    Calcium  8.9 - 10.3 mg/dL 8.8  8.4  7.7   Total Protein 6.5 - 8.1 g/dL 7.4   5.2   Total Bilirubin 0.0 - 1.2 mg/dL 0.8   0.9   Alkaline Phos 38 - 126 U/L 76   41   AST 15 - 41 U/L 23   16   ALT 0 - 44 U/L 11   16      ASSESSMENT & PLAN Neville JINNY Gosling 78 y.o. female with medical history significant for myelodysplastic syndrome.   #Myelodysplastic Syndrome-Low grade: # Thrombocytopenia # Leukopenia/Neutropenia --Bone marrow biopsy form 07/01/2022 showed hypercellular bone marrow (75%) with erythroid and megakaryocytic dysplasia consistent with myelodysplastic  syndrome with multilineage dysplasia  --Labs today show WBC 1.7, Hgb 11.3, MCV 97.1, Plt 132   --No indication to start therapy at this time --Recommend to monitor at this time. --Return in 6 months with labs  and follow up visit.    # Diarrhea -- Patient currently on chronic antibiotic therapy for her cellulitis -- Patient is had approximately 5 days of watery diarrhea. -- Provided patient with sample collecting kit for C. difficile.  Encouraged her to reach out to infectious disease if her symptoms persist  No orders of the defined types were placed in this encounter.   All questions were answered. The patient knows to call the clinic with any problems, questions or concerns.  I have spent a total of 30 minutes minutes of face-to-face and non-face-to-face time, preparing to see the patient,performing a medically appropriate examination,  counseling and educating the patient,documenting clinical information in the electronic health record,  and care coordination.   Norleen IVAR Kidney, MD Department of Hematology/Oncology [facility] Phone: [phone] Pager: [phone] Email: [email or web address]  10/21/2024 7:20 PM

## 2024-10-17 NOTE — Progress Notes (Signed)
 Chief Complaint  Patient presents with   Nail Problem    Pt is here for [facility], and states she was recently in the hospital for cellulitis in the right foot and leg.    Subjective:  78 y.o. female with PMHx of diabetes mellitus presenting today for routine footcare.  Recently discharged from the hospital for cellulitis to the right lower extremity   Past Medical History:  Diagnosis Date   Anxiety    Arthritis    knee, hip   Cellulitis 08/2016   left leg   Complication of anesthesia    Diabetes mellitus without complication (HCC)    Drug rash 07/22/2024   GERD (gastroesophageal reflux disease)    Heart murmur    slight   informed years ago.-    Hernia, umbilical    History of atrial fibrillation 2007   with flagyl   History of endometriosis    Hypercholesteremia    Hypertension    has not been to a cardiologist   PONV (postoperative nausea and vomiting)    Rectal vaginal fistula    Refusal of blood transfusions as patient is Jehovah's Witness    Sleep apnea     Past Surgical History:  Procedure Laterality Date   APPENDECTOMY     BUNIONECTOMY Right 01/09/2019   Procedure: RIGHT FOOT BUNION CORRECTION;  Surgeon: Elsa Lonni SAUNDERS, MD;  Location: [facility] OR;  Service: Orthopedics;  Laterality: Right;   CHOLECYSTECTOMY N/A 03/16/2021   Procedure: LAPAROSCOPIC CHOLECYSTECTOMY;  Surgeon: Aron Shoulders, MD;  Location: [facility];  Service: General;  Laterality: N/A;   ENDOSCOPIC RETROGRADE CHOLANGIOPANCREATOGRAPHY (ERCP) WITH PROPOFOL  N/A 03/15/2021   Procedure: ENDOSCOPIC RETROGRADE CHOLANGIOPANCREATOGRAPHY (ERCP) WITH PROPOFOL ;  Surgeon: Saintclair Jasper, MD;  Location: [facility];  Service: Gastroenterology;  Laterality: N/A;   EXPLORATORY LAPAROTOMY  1973   for endometrosis   HAMMERTOE RECONSTRUCTION WITH WEIL OSTEOTOMY Right 01/09/2019   Procedure: RIGHT FOOT 2-4 HAMMERTOE CORRECTION WITH WEIL OSTEOTOMY 2ND METATARSAL, DEEP TENDON TRANSFER WITH AKIN OSTEOTOMY;  Surgeon: Elsa Lonni SAUNDERS, MD;  Location: [facility] OR;  Service: Orthopedics;  Laterality: Right;   I & D EXTREMITY Right 06/10/2016   Procedure: IRRIGATION AND DEBRIDEMENT THUMB FLEXOR SHEATH;  Surgeon: Franky Curia, MD;  Location: [facility] OR;  Service: Orthopedics;  Laterality: Right;   I & D EXTREMITY Right 04/23/2022   Procedure: RIGHT LEG DEBRIDEMENT;  Surgeon: Harden Jerona GAILS, MD;  Location: [facility] OR;  Service: Orthopedics;  Laterality: Right;   JOINT REPLACEMENT     Left Hip   MANDIBLE SURGERY  1978   rectovaginal fistula repair  1980   REMOVAL OF STONES  03/15/2021   Procedure: REMOVAL OF STONES;  Surgeon: Saintclair Jasper, MD;  Location: [facility];  Service: Gastroenterology;;   ANNETT  03/15/2021   Procedure: ANNETT;  Surgeon: Saintclair Jasper, MD;  Location: [facility];  Service: Gastroenterology;;   TOTAL HIP ARTHROPLASTY  05/08/2012   Procedure: TOTAL HIP ARTHROPLASTY;  Surgeon: Dempsey JINNY Sensor, MD;  Location: [facility] OR;  Service: Orthopedics;  Laterality: Left;   TOTAL HIP ARTHROPLASTY Right 09/18/2018   Procedure: TOTAL HIP ARTHROPLASTY ANTERIOR APPROACH;  Surgeon: Sensor Dempsey, MD;  Location: [facility];  Service: Orthopedics;  Laterality: Right;   TOTAL KNEE ARTHROPLASTY Right 01/24/2013   Dr Sensor   TOTAL KNEE ARTHROPLASTY Right 01/24/2013   Procedure: RIGHT TOTAL KNEE ARTHROPLASTY;  Surgeon: Dempsey JINNY Sensor, MD;  Location: [facility] OR;  Service: Orthopedics;  Laterality: Right;    Allergies  Allergen Reactions  Other Other (See Comments)    Refuses whole blood but does accept albumin    Rocephin  [Ceftriaxone ] Anaphylaxis and Swelling    09/22/24: Patient reported lip swelling, sensation of throat closing, and pruritus while receiving second dose of Rocephin . She was also hypotensive but that appears to have started prior to Rocephin    Ditropan  [Oxybutynin ] Other (See Comments)    Unknown reaction   Flagyl [Metronidazole] Other (See Comments)    Chest pain Induced A-fib    Lipitor [Atorvastatin] Other (See  Comments)    Unknown reaction   Firvanq  [Vancomycin ] Itching   Maxipime  [Cefepime ] Rash    Has tolerated cefazolin , cefadroxil    Septra [Sulfamethoxazole-Trimethoprim] Rash    Blistering rash     RT second toe predebridement 10/17/2024  RT second toe postdebridement 10/17/2024   Objective/Physical Exam General: The patient is alert and oriented x3 in no acute distress.  Dermatology:  Wound #1 noted to the plantar aspect of the right second toe measures approximately 0.7 x 0.7 x 0.2 cm (LxWxD).   To the noted ulceration(s), there is no eschar. There is a moderate amount of slough, fibrin, and necrotic tissue noted. Granulation tissue and wound base is red. There is a minimal amount of serosanguineous drainage noted. There is no exposed bone muscle-tendon ligament or joint. There is no malodor. Periwound integrity is intact.  Vascular: Palpable pedal pulses bilaterally. No edema or erythema noted. Capillary refill within normal limits.  Clinically no concern for vascular compromise  Neurological: Light touch and protective threshold diminished bilaterally.   Musculoskeletal Exam: Patient ambulatory.  No prior amputations.  Plantarflexed right second toe secondary to bunion deformity which is pressing downward against the adjacent digit  Radiographic exam RT foot 10/17/2024: Orthopedic hardware noted throughout the first metatarsal and base of the proximal phalanx right great toe.  Also orthopedic screw within the head of the second metatarsal.  Arthrodesis of the PIPJ of the second toe.  On oblique view the second toe is plantarflexed compared to the adjacent digits.  No obvious erosions or indication of osteomyelitis.  Diffuse osseous degenerative changes noted throughout the midtarsal joints and tarsometatarsal  Assessment: 1.  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 diabetes mellitus w/ peripheral neuropathy 3.  Recent hospital admission secondary to cellulitis  right leg 4.  Pain due to onychomycosis of toenails both  Plan of Care:  -Patient was evaluated.  X-rays reviewed.  Comprehensive diabetic foot exam performed today  -Mechanical debridement of nails 1-5 bilateral was performed using a nail nipper without incident or bleeding -I do believe that possibly the patient's cellulitis to the right lower extremity may have come from the diabetic foot ulcer on the toe -Medically necessary excisional debridement including subcutaneous tissue was performed using a tissue nipper and a chisel blade. Excisional debridement of all the necrotic nonviable tissue down to healthy bleeding viable tissue was performed with post-debridement measurements same as pre-. -Antibiotic ointment and Band-Aid was applied.  Recommend antibiotic ointment and Band-Aid daily -Refrain from going barefoot.  Recommend good supportive shoes and sneakers.  Patient wears socks or goes barefoot the majority of the time around the house. -offloading dancers pad was applied to the insole of the right shoe to offload pressure from the second toe -Patient is on chronic amoxicillin  500 mg twice daily as per ID.  Continue -Return to clinic 4 weeks   Thresa EMERSON Sar, DPM [facility]  Dr. Thresa EMERSON Sar, DPM    [address] [place], [place] [address]                Office [phone]  Fax [phone]

## 2024-10-26 ENCOUNTER — Other Ambulatory Visit: Payer: Self-pay | Admitting: *Deleted

## 2024-10-26 DIAGNOSIS — R197 Diarrhea, unspecified: Secondary | ICD-10-CM

## 2024-10-26 DIAGNOSIS — D46A Refractory cytopenia with multilineage dysplasia: Secondary | ICD-10-CM | POA: Diagnosis not present

## 2024-10-26 LAB — C DIFFICILE QUICK SCREEN W PCR REFLEX
C Diff antigen: NEGATIVE
C Diff interpretation: NOT DETECTED
C Diff toxin: NEGATIVE

## 2024-10-29 ENCOUNTER — Inpatient Hospital Stay

## 2024-10-29 ENCOUNTER — Other Ambulatory Visit: Payer: Self-pay | Admitting: Hematology and Oncology

## 2024-10-29 ENCOUNTER — Inpatient Hospital Stay: Admitting: Hematology and Oncology

## 2024-10-29 DIAGNOSIS — D46Z Other myelodysplastic syndromes: Secondary | ICD-10-CM

## 2024-10-29 DIAGNOSIS — D46A Refractory cytopenia with multilineage dysplasia: Secondary | ICD-10-CM | POA: Diagnosis not present

## 2024-10-29 LAB — CBC WITH DIFFERENTIAL (CANCER CENTER ONLY)
Abs Immature Granulocytes: 0.03 K/uL (ref 0.00–0.07)
Basophils Absolute: 0 K/uL (ref 0.0–0.1)
Basophils Relative: 1 %
Eosinophils Absolute: 0.1 K/uL (ref 0.0–0.5)
Eosinophils Relative: 3 %
HCT: 31.8 % — ABNORMAL LOW (ref 36.0–46.0)
Hemoglobin: 10.7 g/dL — ABNORMAL LOW (ref 12.0–15.0)
Immature Granulocytes: 1 %
Lymphocytes Relative: 40 %
Lymphs Abs: 1 K/uL (ref 0.7–4.0)
MCH: 32.3 pg (ref 26.0–34.0)
MCHC: 33.6 g/dL (ref 30.0–36.0)
MCV: 96.1 fL (ref 80.0–100.0)
Monocytes Absolute: 0.2 K/uL (ref 0.1–1.0)
Monocytes Relative: 8 %
Neutro Abs: 1.1 K/uL — ABNORMAL LOW (ref 1.7–7.7)
Neutrophils Relative %: 47 %
Platelet Count: 136 K/uL — ABNORMAL LOW (ref 150–400)
RBC: 3.31 MIL/uL — ABNORMAL LOW (ref 3.87–5.11)
RDW: 15.5 % (ref 11.5–15.5)
WBC Count: 2.4 K/uL — ABNORMAL LOW (ref 4.0–10.5)
nRBC: 0 % (ref 0.0–0.2)

## 2024-10-29 LAB — CMP (CANCER CENTER ONLY)
ALT: 16 U/L (ref 0–44)
AST: 26 U/L (ref 15–41)
Albumin: 4.2 g/dL (ref 3.5–5.0)
Alkaline Phosphatase: 60 U/L (ref 38–126)
Anion gap: 8 (ref 5–15)
BUN: 12 mg/dL (ref 8–23)
CO2: 27 mmol/L (ref 22–32)
Calcium: 8.9 mg/dL (ref 8.9–10.3)
Chloride: 102 mmol/L (ref 98–111)
Creatinine: 0.86 mg/dL (ref 0.44–1.00)
GFR, Estimated: >60 mL/min
Glucose, Bld: 112 mg/dL — ABNORMAL HIGH (ref 70–99)
Potassium: 4.5 mmol/L (ref 3.5–5.1)
Sodium: 137 mmol/L (ref 135–145)
Total Bilirubin: 0.8 mg/dL (ref 0.0–1.2)
Total Protein: 7 g/dL (ref 6.5–8.1)

## 2024-10-29 LAB — LACTATE DEHYDROGENASE: LDH: 224 U/L (ref 105–235)

## 2024-11-14 ENCOUNTER — Ambulatory Visit: Admitting: Podiatry

## 2024-11-14 ENCOUNTER — Encounter: Payer: Self-pay | Admitting: Podiatry

## 2024-11-14 VITALS — Ht 65.0 in | Wt 220.4 lb

## 2024-11-14 DIAGNOSIS — L97512 Non-pressure chronic ulcer of other part of right foot with fat layer exposed: Secondary | ICD-10-CM

## 2024-11-14 DIAGNOSIS — M2011 Hallux valgus (acquired), right foot: Secondary | ICD-10-CM

## 2024-11-14 DIAGNOSIS — E08621 Diabetes mellitus due to underlying condition with foot ulcer: Secondary | ICD-10-CM | POA: Diagnosis not present

## 2024-11-14 NOTE — Progress Notes (Signed)
 "  Chief Complaint  Patient presents with   Diabetic Ulcer    Pt is here to f/u on right foot due to diabetic ulcer, she thinks everything is going well, no complaints, spot on foot is healing well.    Subjective:  79 y.o. female with PMHx of diabetes mellitus presenting today for follow-up evaluation of an ulcer to the plantar aspect of the right second toe   Past Medical History:  Diagnosis Date   Anxiety    Arthritis    knee, hip   Cellulitis 08/2016   left leg   Complication of anesthesia    Diabetes mellitus without complication (HCC)    Drug rash 07/22/2024   GERD (gastroesophageal reflux disease)    Heart murmur    slight   informed years ago.-    Hernia, umbilical    History of atrial fibrillation 2007   with flagyl   History of endometriosis    Hypercholesteremia    Hypertension    has not been to a cardiologist   PONV (postoperative nausea and vomiting)    Rectal vaginal fistula    Refusal of blood transfusions as patient is Jehovah's Witness    Sleep apnea     Past Surgical History:  Procedure Laterality Date   APPENDECTOMY     BUNIONECTOMY Right 01/09/2019   Procedure: RIGHT FOOT BUNION CORRECTION;  Surgeon: Elsa Lonni SAUNDERS, MD;  Location: [facility] OR;  Service: Orthopedics;  Laterality: Right;   CHOLECYSTECTOMY N/A 03/16/2021   Procedure: LAPAROSCOPIC CHOLECYSTECTOMY;  Surgeon: Aron Shoulders, MD;  Location: [facility];  Service: General;  Laterality: N/A;   ENDOSCOPIC RETROGRADE CHOLANGIOPANCREATOGRAPHY (ERCP) WITH PROPOFOL  N/A 03/15/2021   Procedure: ENDOSCOPIC RETROGRADE CHOLANGIOPANCREATOGRAPHY (ERCP) WITH PROPOFOL ;  Surgeon: Saintclair Jasper, MD;  Location: [facility];  Service: Gastroenterology;  Laterality: N/A;   EXPLORATORY LAPAROTOMY  1973   for endometrosis   HAMMERTOE RECONSTRUCTION WITH WEIL OSTEOTOMY Right 01/09/2019   Procedure: RIGHT FOOT 2-4 HAMMERTOE CORRECTION WITH WEIL OSTEOTOMY 2ND METATARSAL, DEEP TENDON TRANSFER WITH AKIN OSTEOTOMY;  Surgeon:  Elsa Lonni SAUNDERS, MD;  Location: [facility] OR;  Service: Orthopedics;  Laterality: Right;   I & D EXTREMITY Right 06/10/2016   Procedure: IRRIGATION AND DEBRIDEMENT THUMB FLEXOR SHEATH;  Surgeon: Franky Curia, MD;  Location: [facility] OR;  Service: Orthopedics;  Laterality: Right;   I & D EXTREMITY Right 04/23/2022   Procedure: RIGHT LEG DEBRIDEMENT;  Surgeon: Harden Jerona GAILS, MD;  Location: [facility] OR;  Service: Orthopedics;  Laterality: Right;   JOINT REPLACEMENT     Left Hip   MANDIBLE SURGERY  1978   rectovaginal fistula repair  1980   REMOVAL OF STONES  03/15/2021   Procedure: REMOVAL OF STONES;  Surgeon: Saintclair Jasper, MD;  Location: [facility];  Service: Gastroenterology;;   ANNETT  03/15/2021   Procedure: ANNETT;  Surgeon: Saintclair Jasper, MD;  Location: [facility];  Service: Gastroenterology;;   TOTAL HIP ARTHROPLASTY  05/08/2012   Procedure: TOTAL HIP ARTHROPLASTY;  Surgeon: Dempsey JINNY Sensor, MD;  Location: [facility] OR;  Service: Orthopedics;  Laterality: Left;   TOTAL HIP ARTHROPLASTY Right 09/18/2018   Procedure: TOTAL HIP ARTHROPLASTY ANTERIOR APPROACH;  Surgeon: Sensor Dempsey, MD;  Location: [facility];  Service: Orthopedics;  Laterality: Right;   TOTAL KNEE ARTHROPLASTY Right 01/24/2013   Dr Sensor   TOTAL KNEE ARTHROPLASTY Right 01/24/2013   Procedure: RIGHT TOTAL KNEE ARTHROPLASTY;  Surgeon: Dempsey JINNY Sensor, MD;  Location: [facility] OR;  Service: Orthopedics;  Laterality: Right;    Allergies  Allergen Reactions   Other Other (See Comments)    Refuses whole blood but does accept albumin    Rocephin  [Ceftriaxone ] Anaphylaxis and Swelling    09/22/24: Patient reported lip swelling, sensation of throat closing, and pruritus while receiving second dose of Rocephin . She was also hypotensive but that appears to have started prior to Rocephin    Ditropan  [Oxybutynin ] Other (See Comments)    Unknown reaction   Flagyl [Metronidazole] Other (See Comments)    Chest pain Induced A-fib    Lipitor [Atorvastatin] Other  (See Comments)    Unknown reaction   Firvanq  [Vancomycin ] Itching   Maxipime  [Cefepime ] Rash    Has tolerated cefazolin , cefadroxil    Septra [Sulfamethoxazole-Trimethoprim] Rash    Blistering rash     RT second toe predebridement 10/17/2024  RT second toe postdebridement 10/17/2024   Objective/Physical Exam General: The patient is alert and oriented x3 in no acute distress.  Dermatology:  Mostly unchanged.  Wound #1 noted to the plantar aspect of the right second toe measures approximately 0.7 x 0.7 x 0.2 cm (LxWxD).   To the noted ulceration(s), there is no eschar. There is a moderate amount of slough, fibrin, and necrotic tissue noted. Granulation tissue and wound base is red. There is a minimal amount of serosanguineous drainage noted. There is no exposed bone muscle-tendon ligament or joint. There is no malodor. Periwound integrity is intact.  Vascular: Palpable pedal pulses bilaterally. No edema or erythema noted. Capillary refill within normal limits.  Clinically no concern for vascular compromise  Neurological: Light touch and protective threshold diminished bilaterally.   Musculoskeletal Exam: Patient ambulatory.  No prior amputations.  Plantarflexed right second toe secondary to bunion deformity which is pressing downward against the adjacent digit  Radiographic exam RT foot 10/17/2024: Orthopedic hardware noted throughout the first metatarsal and base of the proximal phalanx right great toe.  Also orthopedic screw within the head of the second metatarsal.  Arthrodesis of the PIPJ of the second toe.  On oblique view the second toe is plantarflexed compared to the adjacent digits.  No obvious erosions or indication of osteomyelitis.  Diffuse osseous degenerative changes noted throughout the midtarsal joints and tarsometatarsal  Assessment: 1.  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 diabetes mellitus w/ peripheral neuropathy 3.  Recent hospital admission  secondary to cellulitis right leg 4.  Pain due to onychomycosis of toenails both  Plan of Care:  -Patient was evaluated.   -I do believe that possibly the patient's cellulitis to the right lower extremity may have come from the diabetic foot ulcer on the toe -Medically necessary excisional debridement including subcutaneous tissue was performed using a tissue nipper and a chisel blade. Excisional debridement of all the necrotic nonviable tissue down to healthy bleeding viable tissue was performed with post-debridement measurements same as pre-. -Antibiotic ointment and Band-Aid was applied.  Recommend antibiotic ointment and Band-Aid daily -Refrain from going barefoot.  Recommend good supportive shoes and sneakers.  Patient wears socks or goes barefoot the majority of the time around the house. - Continue offloading dancers pad that was applied to the insole of the right shoe to offload pressure from the second toe -Patient is on chronic amoxicillin  500 mg twice daily as per ID.  Continue - Today after reevaluating the patient and explaining her situation to her, I do believe she is always going to have complications from the right second toe since it is so plantarflexed and creating a significant amount of pressure.  With  the open wound I am also concerned for possible recurrence of infection or osteomyelitis when she is off of the antibiotics.  I do believe that surgery is warranted at this time. -Today we discussed revisional surgery which would include first MTP arthrodesis to realign the first ray with revisional repair of the second digit versus a more simple approach which would include right second toe amputation.  I do believe that amputation of the right second toe is appropriate and outweighs the potential risks and postoperative recovery of a more reconstructive invasive surgery.  Patient like to think about surgery -Return to clinic 2 weeks possible surgical consult   Thresa EMERSON Sar,  DPM [facility]  Dr. Thresa EMERSON Sar, DPM    [address] [place], [place] [address]                Office [phone]  Fax [phone]      "

## 2024-11-19 ENCOUNTER — Ambulatory Visit: Admitting: Infectious Disease

## 2024-11-27 ENCOUNTER — Other Ambulatory Visit: Payer: Self-pay

## 2024-11-27 MED ORDER — AMOXICILLIN 500 MG PO TABS: ORAL_TABLET | ORAL | 11 refills | Status: AC

## 2024-11-28 ENCOUNTER — Ambulatory Visit: Admitting: Podiatry

## 2024-11-28 ENCOUNTER — Encounter: Payer: Self-pay | Admitting: Podiatry

## 2024-11-28 VITALS — Ht 65.0 in | Wt 220.4 lb

## 2024-11-28 DIAGNOSIS — E08621 Diabetes mellitus due to underlying condition with foot ulcer: Secondary | ICD-10-CM | POA: Diagnosis not present

## 2024-11-28 DIAGNOSIS — L97512 Non-pressure chronic ulcer of other part of right foot with fat layer exposed: Secondary | ICD-10-CM

## 2024-11-28 NOTE — Progress Notes (Signed)
 "  Chief Complaint  Patient presents with   Diabetic Ulcer    Pt is here to f/u on right foot due diabetic ulcer, states everything is going well has no complaints.    Subjective:  79 y.o. female with PMHx of diabetes mellitus presenting today for follow-up evaluation of an ulcer to the plantar aspect of the right second toe   Past Medical History:  Diagnosis Date   Anxiety    Arthritis    knee, hip   Cellulitis 08/2016   left leg   Complication of anesthesia    Diabetes mellitus without complication (HCC)    Drug rash 07/22/2024   GERD (gastroesophageal reflux disease)    Heart murmur    slight   informed years ago.-    Hernia, umbilical    History of atrial fibrillation 2007   with flagyl   History of endometriosis    Hypercholesteremia    Hypertension    has not been to a cardiologist   PONV (postoperative nausea and vomiting)    Rectal vaginal fistula    Refusal of blood transfusions as patient is Jehovah's Witness    Sleep apnea     Past Surgical History:  Procedure Laterality Date   APPENDECTOMY     BUNIONECTOMY Right 01/09/2019   Procedure: RIGHT FOOT BUNION CORRECTION;  Surgeon: Elsa Lonni SAUNDERS, MD;  Location: [facility] OR;  Service: Orthopedics;  Laterality: Right;   CHOLECYSTECTOMY N/A 03/16/2021   Procedure: LAPAROSCOPIC CHOLECYSTECTOMY;  Surgeon: Aron Shoulders, MD;  Location: [facility];  Service: General;  Laterality: N/A;   ENDOSCOPIC RETROGRADE CHOLANGIOPANCREATOGRAPHY (ERCP) WITH PROPOFOL  N/A 03/15/2021   Procedure: ENDOSCOPIC RETROGRADE CHOLANGIOPANCREATOGRAPHY (ERCP) WITH PROPOFOL ;  Surgeon: Saintclair Jasper, MD;  Location: [facility];  Service: Gastroenterology;  Laterality: N/A;   EXPLORATORY LAPAROTOMY  1973   for endometrosis   HAMMERTOE RECONSTRUCTION WITH WEIL OSTEOTOMY Right 01/09/2019   Procedure: RIGHT FOOT 2-4 HAMMERTOE CORRECTION WITH WEIL OSTEOTOMY 2ND METATARSAL, DEEP TENDON TRANSFER WITH AKIN OSTEOTOMY;  Surgeon: Elsa Lonni SAUNDERS, MD;  Location:  [facility] OR;  Service: Orthopedics;  Laterality: Right;   I & D EXTREMITY Right 06/10/2016   Procedure: IRRIGATION AND DEBRIDEMENT THUMB FLEXOR SHEATH;  Surgeon: Franky Curia, MD;  Location: [facility] OR;  Service: Orthopedics;  Laterality: Right;   I & D EXTREMITY Right 04/23/2022   Procedure: RIGHT LEG DEBRIDEMENT;  Surgeon: Harden Jerona GAILS, MD;  Location: [facility] OR;  Service: Orthopedics;  Laterality: Right;   JOINT REPLACEMENT     Left Hip   MANDIBLE SURGERY  1978   rectovaginal fistula repair  1980   REMOVAL OF STONES  03/15/2021   Procedure: REMOVAL OF STONES;  Surgeon: Saintclair Jasper, MD;  Location: [facility];  Service: Gastroenterology;;   ANNETT  03/15/2021   Procedure: ANNETT;  Surgeon: Saintclair Jasper, MD;  Location: [facility];  Service: Gastroenterology;;   TOTAL HIP ARTHROPLASTY  05/08/2012   Procedure: TOTAL HIP ARTHROPLASTY;  Surgeon: Dempsey JINNY Sensor, MD;  Location: [facility] OR;  Service: Orthopedics;  Laterality: Left;   TOTAL HIP ARTHROPLASTY Right 09/18/2018   Procedure: TOTAL HIP ARTHROPLASTY ANTERIOR APPROACH;  Surgeon: Sensor Dempsey, MD;  Location: [facility];  Service: Orthopedics;  Laterality: Right;   TOTAL KNEE ARTHROPLASTY Right 01/24/2013   Dr Sensor   TOTAL KNEE ARTHROPLASTY Right 01/24/2013   Procedure: RIGHT TOTAL KNEE ARTHROPLASTY;  Surgeon: Dempsey JINNY Sensor, MD;  Location: [facility] OR;  Service: Orthopedics;  Laterality: Right;    Allergies  Allergen Reactions   Other Other (  See Comments)    Refuses whole blood but does accept albumin    Rocephin  [Ceftriaxone ] Anaphylaxis and Swelling    09/22/24: Patient reported lip swelling, sensation of throat closing, and pruritus while receiving second dose of Rocephin . She was also hypotensive but that appears to have started prior to Rocephin    Ditropan  [Oxybutynin ] Other (See Comments)    Unknown reaction   Flagyl [Metronidazole] Other (See Comments)    Chest pain Induced A-fib    Lipitor [Atorvastatin] Other (See Comments)    Unknown reaction    Firvanq  [Vancomycin ] Itching   Maxipime  [Cefepime ] Rash    Has tolerated cefazolin , cefadroxil    Septra [Sulfamethoxazole-Trimethoprim] Rash    Blistering rash     RT second toe predebridement 10/17/2024  RT second toe postdebridement 10/17/2024   Objective/Physical Exam General: The patient is alert and oriented x3 in no acute distress.  Dermatology:  Mostly unchanged.  Wound #1 noted to the plantar aspect of the right second toe measures approximately 0.7 x 0.7 x 0.2 cm (LxWxD).   To the noted ulceration(s), there is no eschar. There is a moderate amount of slough, fibrin, and necrotic tissue noted. Granulation tissue and wound base is red. There is a minimal amount of serosanguineous drainage noted. There is no exposed bone muscle-tendon ligament or joint. There is no malodor. Periwound integrity is intact.  Vascular: Palpable pedal pulses bilaterally. No edema or erythema noted. Capillary refill within normal limits.  Clinically no concern for vascular compromise  Neurological: Light touch and protective threshold diminished bilaterally.   Musculoskeletal Exam: Patient ambulatory.  No prior amputations.  Plantarflexed right second toe secondary to bunion deformity which is pressing downward against the adjacent digit  Radiographic exam RT foot 10/17/2024: Orthopedic hardware noted throughout the first metatarsal and base of the proximal phalanx right great toe.  Also orthopedic screw within the head of the second metatarsal.  Arthrodesis of the PIPJ of the second toe.  On oblique view the second toe is plantarflexed compared to the adjacent digits.  No obvious erosions or indication of osteomyelitis.  Diffuse osseous degenerative changes noted throughout the midtarsal joints and tarsometatarsal  Assessment: 1.  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 diabetes mellitus w/ peripheral neuropathy 3.  Recent hospital admission secondary to cellulitis right leg 4.   Pain due to onychomycosis of toenails both  Plan of Care:  -Patient was evaluated.   - Medically necessary excisional debridement including subcutaneous tissue was performed today using a tissue nipper.  Excisional debridement of the necrotic nonviable tissue down to healthier bleeding viable tissue was performed with postdebridement of the same as pre- -Today again we did discuss the possibility of corrective surgery to alleviate pressure from the second toe.  However the after thinking about it the patient would like to hold off on any surgery -Return to clinic PRN  Thresa EMERSON Sar, DPM [facility]  Dr. Thresa EMERSON Sar, DPM    [address] [place], [place] [address]                Office [phone]  Fax [phone]      "

## 2024-12-25 ENCOUNTER — Ambulatory Visit: Admitting: Infectious Disease

## 2025-01-07 ENCOUNTER — Ambulatory Visit: Admitting: Infectious Disease

## 2025-04-15 ENCOUNTER — Inpatient Hospital Stay

## 2025-04-15 ENCOUNTER — Inpatient Hospital Stay: Admitting: Hematology and Oncology
